# Patient Record
Sex: Female | Born: 1945
Health system: Southern US, Community
[De-identification: ages and names within clinical notes are randomized; demographics above are authoritative.]

## PROBLEM LIST (undated history)

## (undated) DIAGNOSIS — I10 Essential (primary) hypertension: Secondary | ICD-10-CM

## (undated) DIAGNOSIS — F329 Major depressive disorder, single episode, unspecified: Secondary | ICD-10-CM

## (undated) DIAGNOSIS — N183 Chronic kidney disease, stage 3 unspecified: Secondary | ICD-10-CM

## (undated) DIAGNOSIS — I509 Heart failure, unspecified: Secondary | ICD-10-CM

## (undated) DIAGNOSIS — E039 Hypothyroidism, unspecified: Secondary | ICD-10-CM

## (undated) DIAGNOSIS — K219 Gastro-esophageal reflux disease without esophagitis: Secondary | ICD-10-CM

## (undated) DIAGNOSIS — C801 Malignant (primary) neoplasm, unspecified: Secondary | ICD-10-CM

## (undated) DIAGNOSIS — E119 Type 2 diabetes mellitus without complications: Secondary | ICD-10-CM

## (undated) DIAGNOSIS — C55 Malignant neoplasm of uterus, part unspecified: Secondary | ICD-10-CM

## (undated) DIAGNOSIS — M1990 Unspecified osteoarthritis, unspecified site: Secondary | ICD-10-CM

## (undated) DIAGNOSIS — R519 Headache, unspecified: Secondary | ICD-10-CM

## (undated) DIAGNOSIS — M549 Dorsalgia, unspecified: Secondary | ICD-10-CM

## (undated) DIAGNOSIS — R51 Headache: Secondary | ICD-10-CM

## (undated) DIAGNOSIS — I4891 Unspecified atrial fibrillation: Secondary | ICD-10-CM

## (undated) DIAGNOSIS — I219 Acute myocardial infarction, unspecified: Secondary | ICD-10-CM

## (undated) DIAGNOSIS — F32A Depression, unspecified: Secondary | ICD-10-CM

## (undated) DIAGNOSIS — I2510 Atherosclerotic heart disease of native coronary artery without angina pectoris: Secondary | ICD-10-CM

## (undated) DIAGNOSIS — E538 Deficiency of other specified B group vitamins: Secondary | ICD-10-CM

## (undated) DIAGNOSIS — G5600 Carpal tunnel syndrome, unspecified upper limb: Secondary | ICD-10-CM

## (undated) DIAGNOSIS — Z8669 Personal history of other diseases of the nervous system and sense organs: Secondary | ICD-10-CM

## (undated) DIAGNOSIS — E669 Obesity, unspecified: Secondary | ICD-10-CM

## (undated) DIAGNOSIS — Z8719 Personal history of other diseases of the digestive system: Secondary | ICD-10-CM

## (undated) DIAGNOSIS — E785 Hyperlipidemia, unspecified: Secondary | ICD-10-CM

## (undated) DIAGNOSIS — G8929 Other chronic pain: Secondary | ICD-10-CM

## (undated) HISTORY — PX: LUMBAR FUSION: SHX111

## (undated) HISTORY — PX: CHOLECYSTECTOMY: SHX55

## (undated) HISTORY — PX: ABDOMINAL HYSTERECTOMY: SHX81

## (undated) HISTORY — DX: Personal history of other diseases of the nervous system and sense organs: Z86.69

## (undated) HISTORY — DX: Chronic kidney disease, stage 3 (moderate): N18.3

## (undated) HISTORY — PX: SPINE SURGERY: SHX786

## (undated) HISTORY — PX: CARDIAC SURGERY: SHX584

## (undated) HISTORY — DX: Gastro-esophageal reflux disease without esophagitis: K21.9

## (undated) HISTORY — PX: VESICOVAGINAL FISTULA CLOSURE W/ TAH: SUR271

## (undated) HISTORY — DX: Dorsalgia, unspecified: M54.9

## (undated) HISTORY — DX: Type 2 diabetes mellitus without complications: E11.9

## (undated) HISTORY — DX: Deficiency of other specified B group vitamins: E53.8

## (undated) HISTORY — DX: Other chronic pain: G89.29

## (undated) HISTORY — PX: HERNIA REPAIR: SHX51

## (undated) HISTORY — DX: Essential (primary) hypertension: I10

## (undated) HISTORY — DX: Atherosclerotic heart disease of native coronary artery without angina pectoris: I25.10

## (undated) HISTORY — DX: Obesity, unspecified: E66.9

## (undated) HISTORY — PX: CERVICAL LAMINECTOMY: SHX94

## (undated) HISTORY — PX: BACK SURGERY: SHX140

## (undated) HISTORY — DX: Malignant neoplasm of uterus, part unspecified: C55

## (undated) HISTORY — DX: Hyperlipidemia, unspecified: E78.5

## (undated) HISTORY — PX: OTHER SURGICAL HISTORY: SHX169

## (undated) HISTORY — DX: Chronic kidney disease, stage 3 unspecified: N18.30

---

## 1997-11-16 DIAGNOSIS — I219 Acute myocardial infarction, unspecified: Secondary | ICD-10-CM

## 1997-11-16 HISTORY — DX: Acute myocardial infarction, unspecified: I21.9

## 2001-08-09 ENCOUNTER — Ambulatory Visit (HOSPITAL_COMMUNITY): Admission: RE | Admit: 2001-08-09 | Discharge: 2001-08-09 | Payer: Self-pay | Admitting: Family Medicine

## 2001-08-09 ENCOUNTER — Encounter: Payer: Self-pay | Admitting: Family Medicine

## 2001-09-05 ENCOUNTER — Ambulatory Visit (HOSPITAL_COMMUNITY): Admission: RE | Admit: 2001-09-05 | Discharge: 2001-09-05 | Payer: Self-pay | Admitting: Pediatrics

## 2001-09-05 ENCOUNTER — Encounter: Payer: Self-pay | Admitting: Family Medicine

## 2001-11-14 ENCOUNTER — Ambulatory Visit (HOSPITAL_COMMUNITY): Admission: RE | Admit: 2001-11-14 | Discharge: 2001-11-14 | Payer: Self-pay | Admitting: Family Medicine

## 2001-11-14 ENCOUNTER — Encounter: Payer: Self-pay | Admitting: Family Medicine

## 2002-03-29 ENCOUNTER — Encounter: Payer: Self-pay | Admitting: Internal Medicine

## 2002-03-29 ENCOUNTER — Emergency Department (HOSPITAL_COMMUNITY): Admission: EM | Admit: 2002-03-29 | Discharge: 2002-03-29 | Payer: Self-pay | Admitting: Internal Medicine

## 2002-03-30 ENCOUNTER — Emergency Department (HOSPITAL_COMMUNITY): Admission: EM | Admit: 2002-03-30 | Discharge: 2002-03-30 | Payer: Self-pay | Admitting: Emergency Medicine

## 2002-03-31 ENCOUNTER — Encounter: Payer: Self-pay | Admitting: Internal Medicine

## 2002-03-31 ENCOUNTER — Ambulatory Visit (HOSPITAL_COMMUNITY): Admission: RE | Admit: 2002-03-31 | Discharge: 2002-03-31 | Payer: Self-pay | Admitting: Internal Medicine

## 2002-04-26 ENCOUNTER — Inpatient Hospital Stay (HOSPITAL_COMMUNITY): Admission: RE | Admit: 2002-04-26 | Discharge: 2002-04-27 | Payer: Self-pay | Admitting: Neurosurgery

## 2002-04-26 ENCOUNTER — Encounter: Payer: Self-pay | Admitting: Neurosurgery

## 2002-08-14 ENCOUNTER — Encounter: Payer: Self-pay | Admitting: Neurosurgery

## 2002-08-14 ENCOUNTER — Ambulatory Visit (HOSPITAL_COMMUNITY): Admission: RE | Admit: 2002-08-14 | Discharge: 2002-08-14 | Payer: Self-pay | Admitting: Neurosurgery

## 2002-11-17 ENCOUNTER — Encounter: Payer: Self-pay | Admitting: Family Medicine

## 2002-11-17 ENCOUNTER — Ambulatory Visit (HOSPITAL_COMMUNITY): Admission: RE | Admit: 2002-11-17 | Discharge: 2002-11-17 | Payer: Self-pay | Admitting: Family Medicine

## 2002-12-27 ENCOUNTER — Encounter: Payer: Self-pay | Admitting: Family Medicine

## 2002-12-27 ENCOUNTER — Ambulatory Visit (HOSPITAL_COMMUNITY): Admission: RE | Admit: 2002-12-27 | Discharge: 2002-12-27 | Payer: Self-pay | Admitting: Family Medicine

## 2003-01-19 ENCOUNTER — Encounter: Payer: Self-pay | Admitting: Family Medicine

## 2003-01-19 ENCOUNTER — Ambulatory Visit (HOSPITAL_COMMUNITY): Admission: RE | Admit: 2003-01-19 | Discharge: 2003-01-19 | Payer: Self-pay | Admitting: Family Medicine

## 2003-02-20 ENCOUNTER — Encounter (HOSPITAL_COMMUNITY): Admission: RE | Admit: 2003-02-20 | Discharge: 2003-03-22 | Payer: Self-pay | Admitting: Oncology

## 2003-02-20 ENCOUNTER — Encounter: Admission: RE | Admit: 2003-02-20 | Discharge: 2003-02-20 | Payer: Self-pay | Admitting: Oncology

## 2003-03-29 ENCOUNTER — Encounter (HOSPITAL_COMMUNITY): Admission: RE | Admit: 2003-03-29 | Discharge: 2003-04-28 | Payer: Self-pay | Admitting: Family Medicine

## 2003-03-29 ENCOUNTER — Encounter: Payer: Self-pay | Admitting: Family Medicine

## 2003-03-30 ENCOUNTER — Encounter: Payer: Self-pay | Admitting: Family Medicine

## 2003-05-18 ENCOUNTER — Encounter: Payer: Self-pay | Admitting: Family Medicine

## 2003-05-18 ENCOUNTER — Ambulatory Visit (HOSPITAL_COMMUNITY): Admission: RE | Admit: 2003-05-18 | Discharge: 2003-05-18 | Payer: Self-pay | Admitting: Family Medicine

## 2003-10-17 ENCOUNTER — Inpatient Hospital Stay (HOSPITAL_COMMUNITY): Admission: EM | Admit: 2003-10-17 | Discharge: 2003-10-18 | Payer: Self-pay | Admitting: Emergency Medicine

## 2003-11-26 ENCOUNTER — Ambulatory Visit (HOSPITAL_COMMUNITY): Admission: RE | Admit: 2003-11-26 | Discharge: 2003-11-26 | Payer: Self-pay | Admitting: Family Medicine

## 2004-06-29 ENCOUNTER — Emergency Department (HOSPITAL_COMMUNITY): Admission: EM | Admit: 2004-06-29 | Discharge: 2004-06-29 | Payer: Self-pay | Admitting: Emergency Medicine

## 2004-10-20 ENCOUNTER — Ambulatory Visit: Payer: Self-pay | Admitting: Family Medicine

## 2004-11-27 ENCOUNTER — Ambulatory Visit (HOSPITAL_COMMUNITY): Admission: RE | Admit: 2004-11-27 | Discharge: 2004-11-27 | Payer: Self-pay | Admitting: Family Medicine

## 2004-12-02 ENCOUNTER — Ambulatory Visit: Payer: Self-pay | Admitting: Family Medicine

## 2005-01-17 ENCOUNTER — Emergency Department (HOSPITAL_COMMUNITY): Admission: EM | Admit: 2005-01-17 | Discharge: 2005-01-17 | Payer: Self-pay | Admitting: Emergency Medicine

## 2005-01-17 ENCOUNTER — Inpatient Hospital Stay (HOSPITAL_COMMUNITY): Admission: AD | Admit: 2005-01-17 | Discharge: 2005-01-18 | Payer: Self-pay | Admitting: *Deleted

## 2005-01-27 ENCOUNTER — Ambulatory Visit: Payer: Self-pay | Admitting: Family Medicine

## 2005-03-03 ENCOUNTER — Ambulatory Visit: Payer: Self-pay | Admitting: Family Medicine

## 2005-04-09 ENCOUNTER — Ambulatory Visit: Payer: Self-pay | Admitting: Family Medicine

## 2005-05-20 ENCOUNTER — Ambulatory Visit: Payer: Self-pay | Admitting: Family Medicine

## 2005-07-30 ENCOUNTER — Ambulatory Visit: Payer: Self-pay | Admitting: Family Medicine

## 2005-08-27 ENCOUNTER — Ambulatory Visit: Payer: Self-pay | Admitting: Family Medicine

## 2005-10-15 ENCOUNTER — Ambulatory Visit: Payer: Self-pay | Admitting: Family Medicine

## 2005-11-16 HISTORY — PX: COLONOSCOPY: SHX174

## 2005-11-30 ENCOUNTER — Ambulatory Visit (HOSPITAL_COMMUNITY): Admission: RE | Admit: 2005-11-30 | Discharge: 2005-11-30 | Payer: Self-pay | Admitting: Family Medicine

## 2005-12-16 ENCOUNTER — Ambulatory Visit: Payer: Self-pay | Admitting: Family Medicine

## 2006-02-18 ENCOUNTER — Ambulatory Visit (HOSPITAL_COMMUNITY): Admission: RE | Admit: 2006-02-18 | Discharge: 2006-02-18 | Payer: Self-pay | Admitting: General Surgery

## 2006-02-23 ENCOUNTER — Ambulatory Visit (HOSPITAL_COMMUNITY): Admission: RE | Admit: 2006-02-23 | Discharge: 2006-02-23 | Payer: Self-pay | Admitting: General Surgery

## 2006-03-12 ENCOUNTER — Ambulatory Visit: Payer: Self-pay | Admitting: Family Medicine

## 2006-05-20 ENCOUNTER — Ambulatory Visit (HOSPITAL_COMMUNITY): Admission: RE | Admit: 2006-05-20 | Discharge: 2006-05-20 | Payer: Self-pay | Admitting: General Surgery

## 2006-05-28 ENCOUNTER — Ambulatory Visit: Payer: Self-pay | Admitting: Family Medicine

## 2006-09-10 ENCOUNTER — Ambulatory Visit: Payer: Self-pay | Admitting: Family Medicine

## 2006-09-13 ENCOUNTER — Ambulatory Visit (HOSPITAL_COMMUNITY): Admission: RE | Admit: 2006-09-13 | Discharge: 2006-09-13 | Payer: Self-pay | Admitting: Family Medicine

## 2006-10-28 ENCOUNTER — Ambulatory Visit: Payer: Self-pay | Admitting: Family Medicine

## 2006-12-06 ENCOUNTER — Encounter: Payer: Self-pay | Admitting: Family Medicine

## 2006-12-06 ENCOUNTER — Ambulatory Visit (HOSPITAL_COMMUNITY): Admission: RE | Admit: 2006-12-06 | Discharge: 2006-12-06 | Payer: Self-pay | Admitting: Family Medicine

## 2006-12-06 LAB — CONVERTED CEMR LAB
ALT: 8 units/L (ref 0–35)
AST: 15 units/L (ref 0–37)
Alkaline Phosphatase: 33 units/L — ABNORMAL LOW (ref 39–117)
BUN: 22 mg/dL (ref 6–23)
Bilirubin, Direct: 0.1 mg/dL (ref 0.0–0.3)
Calcium: 9.4 mg/dL (ref 8.4–10.5)
Cholesterol: 102 mg/dL (ref 0–200)
Creatinine, Ser: 1.15 mg/dL (ref 0.40–1.20)
Glucose, Bld: 100 mg/dL — ABNORMAL HIGH (ref 70–99)
Indirect Bilirubin: 0.4 mg/dL (ref 0.0–0.9)
VLDL: 44 mg/dL — ABNORMAL HIGH (ref 0–40)

## 2006-12-14 ENCOUNTER — Ambulatory Visit: Payer: Self-pay | Admitting: Family Medicine

## 2007-01-07 ENCOUNTER — Ambulatory Visit: Payer: Self-pay | Admitting: Family Medicine

## 2007-01-24 ENCOUNTER — Ambulatory Visit: Payer: Self-pay | Admitting: Family Medicine

## 2007-01-24 LAB — CONVERTED CEMR LAB
Basophils Absolute: 0 10*3/uL (ref 0.0–0.1)
Basophils Relative: 0 % (ref 0–1)
Eosinophils Relative: 1 % (ref 0–5)
Folate: 8.1 ng/mL
HCT: 36 % (ref 36.0–46.0)
Hemoglobin: 11.5 g/dL — ABNORMAL LOW (ref 12.0–15.0)
MCHC: 31.9 g/dL (ref 30.0–36.0)
Monocytes Absolute: 0.4 10*3/uL (ref 0.2–0.7)
Neutro Abs: 3.2 10*3/uL (ref 1.7–7.7)
RDW: 13.9 % (ref 11.5–14.0)
Retic Ct Pct: 2.8 % (ref 0.4–3.1)
Saturation Ratios: 20 % (ref 20–55)
TIBC: 432 ug/dL (ref 250–470)
UIBC: 346 ug/dL

## 2007-01-25 ENCOUNTER — Ambulatory Visit: Payer: Self-pay | Admitting: Family Medicine

## 2007-02-09 ENCOUNTER — Ambulatory Visit: Payer: Self-pay | Admitting: Family Medicine

## 2007-02-10 ENCOUNTER — Encounter: Payer: Self-pay | Admitting: Family Medicine

## 2007-02-16 ENCOUNTER — Ambulatory Visit (HOSPITAL_COMMUNITY): Payer: Self-pay | Admitting: Oncology

## 2007-02-16 ENCOUNTER — Encounter (HOSPITAL_COMMUNITY): Admission: RE | Admit: 2007-02-16 | Discharge: 2007-03-18 | Payer: Self-pay | Admitting: Oncology

## 2007-02-23 ENCOUNTER — Ambulatory Visit: Payer: Self-pay | Admitting: Family Medicine

## 2007-03-25 ENCOUNTER — Other Ambulatory Visit: Admission: RE | Admit: 2007-03-25 | Discharge: 2007-03-25 | Payer: Self-pay | Admitting: Family Medicine

## 2007-03-25 ENCOUNTER — Ambulatory Visit: Payer: Self-pay | Admitting: Family Medicine

## 2007-03-25 ENCOUNTER — Encounter: Payer: Self-pay | Admitting: Family Medicine

## 2007-03-25 LAB — CONVERTED CEMR LAB
AST: 15 units/L (ref 0–37)
Albumin: 4.2 g/dL (ref 3.5–5.2)
Alkaline Phosphatase: 33 units/L — ABNORMAL LOW (ref 39–117)
Calcium: 10.1 mg/dL (ref 8.4–10.5)
Creatinine, Ser: 1.26 mg/dL — ABNORMAL HIGH (ref 0.40–1.20)
HDL: 20 mg/dL — ABNORMAL LOW (ref >39)
Pap Smear: NORMAL
Total Protein: 6.9 g/dL (ref 6.0–8.3)
Triglycerides: 179 mg/dL — ABNORMAL HIGH (ref <150)

## 2007-03-29 ENCOUNTER — Ambulatory Visit (HOSPITAL_COMMUNITY): Admission: RE | Admit: 2007-03-29 | Discharge: 2007-03-29 | Payer: Self-pay | Admitting: Family Medicine

## 2007-04-02 ENCOUNTER — Emergency Department (HOSPITAL_COMMUNITY): Admission: EM | Admit: 2007-04-02 | Discharge: 2007-04-02 | Payer: Self-pay | Admitting: Emergency Medicine

## 2007-04-04 ENCOUNTER — Ambulatory Visit: Payer: Self-pay | Admitting: Family Medicine

## 2007-04-06 ENCOUNTER — Ambulatory Visit: Payer: Self-pay | Admitting: Family Medicine

## 2007-04-08 ENCOUNTER — Encounter (HOSPITAL_COMMUNITY): Admission: RE | Admit: 2007-04-08 | Discharge: 2007-05-08 | Payer: Self-pay | Admitting: Family Medicine

## 2007-04-08 ENCOUNTER — Ambulatory Visit: Admission: RE | Admit: 2007-04-08 | Discharge: 2007-04-08 | Payer: Self-pay | Admitting: Family Medicine

## 2007-04-12 ENCOUNTER — Ambulatory Visit: Payer: Self-pay | Admitting: Family Medicine

## 2007-04-14 ENCOUNTER — Ambulatory Visit: Payer: Self-pay | Admitting: Pulmonary Disease

## 2007-04-19 ENCOUNTER — Ambulatory Visit: Payer: Self-pay | Admitting: Family Medicine

## 2007-04-27 ENCOUNTER — Ambulatory Visit: Payer: Self-pay | Admitting: Family Medicine

## 2007-05-11 ENCOUNTER — Ambulatory Visit: Payer: Self-pay | Admitting: Family Medicine

## 2007-05-26 ENCOUNTER — Ambulatory Visit: Payer: Self-pay | Admitting: Family Medicine

## 2007-06-01 ENCOUNTER — Ambulatory Visit: Payer: Self-pay | Admitting: Family Medicine

## 2007-06-08 ENCOUNTER — Ambulatory Visit: Payer: Self-pay | Admitting: Family Medicine

## 2007-06-14 ENCOUNTER — Ambulatory Visit: Payer: Self-pay | Admitting: Family Medicine

## 2007-07-05 ENCOUNTER — Encounter: Payer: Self-pay | Admitting: Family Medicine

## 2007-07-05 LAB — CONVERTED CEMR LAB
ALT: 9 units/L (ref 0–35)
BUN: 19 mg/dL (ref 6–23)
Bilirubin, Direct: 0.1 mg/dL (ref 0.0–0.3)
Calcium: 9.2 mg/dL (ref 8.4–10.5)
Chloride: 100 meq/L (ref 96–112)
Creatinine, Ser: 0.94 mg/dL (ref 0.40–1.20)
Total Protein: 6.6 g/dL (ref 6.0–8.3)

## 2007-07-07 ENCOUNTER — Ambulatory Visit: Payer: Self-pay | Admitting: Family Medicine

## 2007-07-07 LAB — CONVERTED CEMR LAB: Vitamin B-12: 460 pg/mL (ref 211–911)

## 2007-07-15 ENCOUNTER — Ambulatory Visit: Payer: Self-pay | Admitting: Family Medicine

## 2007-08-18 ENCOUNTER — Ambulatory Visit: Payer: Self-pay | Admitting: Family Medicine

## 2007-09-28 ENCOUNTER — Ambulatory Visit: Payer: Self-pay | Admitting: Family Medicine

## 2007-10-28 ENCOUNTER — Ambulatory Visit: Payer: Self-pay | Admitting: Family Medicine

## 2007-11-04 ENCOUNTER — Encounter: Payer: Self-pay | Admitting: Family Medicine

## 2007-11-04 LAB — CONVERTED CEMR LAB
ALT: 8 units/L (ref 0–35)
AST: 14 units/L (ref 0–37)
Albumin: 4 g/dL (ref 3.5–5.2)
CO2: 27 meq/L (ref 19–32)
Calcium: 9.3 mg/dL (ref 8.4–10.5)
Cholesterol: 120 mg/dL (ref 0–200)
HDL: 41 mg/dL (ref >39)
Potassium: 4.3 meq/L (ref 3.5–5.3)
Sodium: 139 meq/L (ref 135–145)
Total CHOL/HDL Ratio: 2.9

## 2007-11-17 ENCOUNTER — Encounter: Payer: Self-pay | Admitting: Family Medicine

## 2007-11-17 HISTORY — PX: LAPAROSCOPIC GASTRIC BANDING: SHX1100

## 2007-12-02 ENCOUNTER — Ambulatory Visit: Payer: Self-pay | Admitting: Family Medicine

## 2007-12-08 ENCOUNTER — Ambulatory Visit (HOSPITAL_COMMUNITY): Admission: RE | Admit: 2007-12-08 | Discharge: 2007-12-08 | Payer: Self-pay | Admitting: Family Medicine

## 2007-12-09 ENCOUNTER — Encounter: Payer: Self-pay | Admitting: Family Medicine

## 2007-12-09 DIAGNOSIS — E785 Hyperlipidemia, unspecified: Secondary | ICD-10-CM

## 2007-12-09 DIAGNOSIS — I10 Essential (primary) hypertension: Secondary | ICD-10-CM | POA: Insufficient documentation

## 2007-12-09 DIAGNOSIS — K219 Gastro-esophageal reflux disease without esophagitis: Secondary | ICD-10-CM

## 2007-12-09 DIAGNOSIS — E538 Deficiency of other specified B group vitamins: Secondary | ICD-10-CM | POA: Insufficient documentation

## 2007-12-09 DIAGNOSIS — R7302 Impaired glucose tolerance (oral): Secondary | ICD-10-CM | POA: Insufficient documentation

## 2007-12-09 DIAGNOSIS — M48061 Spinal stenosis, lumbar region without neurogenic claudication: Secondary | ICD-10-CM

## 2007-12-09 DIAGNOSIS — E669 Obesity, unspecified: Secondary | ICD-10-CM | POA: Insufficient documentation

## 2007-12-09 DIAGNOSIS — M5416 Radiculopathy, lumbar region: Secondary | ICD-10-CM

## 2007-12-21 ENCOUNTER — Emergency Department (HOSPITAL_COMMUNITY): Admission: EM | Admit: 2007-12-21 | Discharge: 2007-12-21 | Payer: Self-pay | Admitting: Emergency Medicine

## 2008-01-12 ENCOUNTER — Ambulatory Visit: Payer: Self-pay | Admitting: Family Medicine

## 2008-01-12 LAB — CONVERTED CEMR LAB
Albumin: 4 g/dL (ref 3.5–5.2)
Alkaline Phosphatase: 62 units/L (ref 39–117)
Basophils Absolute: 0 10*3/uL (ref 0.0–0.1)
Basophils Relative: 0 % (ref 0–1)
Chloride: 101 meq/L (ref 96–112)
Creatinine, Ser: 0.99 mg/dL (ref 0.40–1.20)
HDL: 42 mg/dL (ref >39)
LDL Cholesterol: 49 mg/dL (ref 0–99)
MCHC: 30.8 g/dL (ref 30.0–36.0)
Monocytes Absolute: 0.5 10*3/uL (ref 0.1–1.0)
Neutro Abs: 3.3 10*3/uL (ref 1.7–7.7)
Neutrophils Relative %: 48 % (ref 43–77)
Platelets: 273 10*3/uL (ref 150–400)
Potassium: 4.8 meq/L (ref 3.5–5.3)
RDW: 14.9 % (ref 11.5–15.5)
Total Protein: 7.1 g/dL (ref 6.0–8.3)
Triglycerides: 184 mg/dL — ABNORMAL HIGH (ref <150)

## 2008-02-09 ENCOUNTER — Ambulatory Visit: Payer: Self-pay | Admitting: Family Medicine

## 2008-03-08 ENCOUNTER — Encounter: Payer: Self-pay | Admitting: Family Medicine

## 2008-03-08 ENCOUNTER — Encounter (INDEPENDENT_AMBULATORY_CARE_PROVIDER_SITE_OTHER): Payer: Self-pay | Admitting: *Deleted

## 2008-03-08 ENCOUNTER — Ambulatory Visit: Payer: Self-pay | Admitting: Family Medicine

## 2008-03-16 ENCOUNTER — Emergency Department (HOSPITAL_COMMUNITY): Admission: EM | Admit: 2008-03-16 | Discharge: 2008-03-16 | Payer: Self-pay | Admitting: Emergency Medicine

## 2008-03-20 ENCOUNTER — Ambulatory Visit: Payer: Self-pay | Admitting: Family Medicine

## 2008-04-18 ENCOUNTER — Ambulatory Visit: Payer: Self-pay | Admitting: Family Medicine

## 2008-04-25 ENCOUNTER — Ambulatory Visit: Payer: Self-pay | Admitting: Family Medicine

## 2008-06-19 ENCOUNTER — Encounter: Payer: Self-pay | Admitting: Family Medicine

## 2008-06-25 ENCOUNTER — Encounter: Payer: Self-pay | Admitting: Family Medicine

## 2008-06-27 ENCOUNTER — Telehealth: Payer: Self-pay | Admitting: Family Medicine

## 2008-06-27 ENCOUNTER — Encounter: Payer: Self-pay | Admitting: Family Medicine

## 2008-07-06 ENCOUNTER — Encounter: Payer: Self-pay | Admitting: Family Medicine

## 2008-07-11 ENCOUNTER — Encounter: Payer: Self-pay | Admitting: Family Medicine

## 2008-07-27 ENCOUNTER — Ambulatory Visit: Payer: Self-pay | Admitting: Family Medicine

## 2008-07-27 LAB — CONVERTED CEMR LAB
Blood Glucose, Fasting: 137 mg/dL
Hgb A1c MFr Bld: 6.4 %

## 2008-07-31 ENCOUNTER — Telehealth: Payer: Self-pay | Admitting: Family Medicine

## 2008-08-08 ENCOUNTER — Encounter: Payer: Self-pay | Admitting: Family Medicine

## 2008-09-05 ENCOUNTER — Ambulatory Visit: Payer: Self-pay | Admitting: Family Medicine

## 2008-09-05 ENCOUNTER — Telehealth: Payer: Self-pay | Admitting: Family Medicine

## 2008-09-05 LAB — CONVERTED CEMR LAB
AST: 15 units/L (ref 0–37)
Albumin: 4.3 g/dL (ref 3.5–5.2)
BUN: 21 mg/dL (ref 6–23)
Basophils Relative: 0 % (ref 0–1)
Calcium: 9.4 mg/dL (ref 8.4–10.5)
Eosinophils Absolute: 0.2 10*3/uL (ref 0.0–0.7)
Eosinophils Relative: 3 % (ref 0–5)
HCT: 38.1 % (ref 36.0–46.0)
HDL: 47 mg/dL (ref >39)
Hemoglobin: 12.1 g/dL (ref 12.0–15.0)
Ketones, urine, test strip: NEGATIVE
LDL Cholesterol: 77 mg/dL (ref 0–99)
MCHC: 31.8 g/dL (ref 30.0–36.0)
MCV: 94.1 fL (ref 78.0–100.0)
Monocytes Absolute: 0.4 10*3/uL (ref 0.1–1.0)
Monocytes Relative: 7 % (ref 3–12)
Nitrite: NEGATIVE
RBC: 4.05 M/uL (ref 3.87–5.11)
Specific Gravity, Urine: 1.01
TSH: 2.827 microintl units/mL (ref 0.350–4.50)
Total Bilirubin: 0.3 mg/dL (ref 0.3–1.2)
Total CHOL/HDL Ratio: 3.7
VLDL: 49 mg/dL — ABNORMAL HIGH (ref 0–40)
pH: 5

## 2008-09-06 ENCOUNTER — Encounter: Payer: Self-pay | Admitting: Family Medicine

## 2008-09-07 ENCOUNTER — Encounter: Payer: Self-pay | Admitting: Family Medicine

## 2008-09-07 ENCOUNTER — Ambulatory Visit (HOSPITAL_COMMUNITY): Admission: RE | Admit: 2008-09-07 | Discharge: 2008-09-07 | Payer: Self-pay | Admitting: Family Medicine

## 2008-09-11 ENCOUNTER — Telehealth: Payer: Self-pay | Admitting: Family Medicine

## 2008-09-19 ENCOUNTER — Encounter: Payer: Self-pay | Admitting: Family Medicine

## 2008-09-24 ENCOUNTER — Telehealth: Payer: Self-pay | Admitting: Family Medicine

## 2008-09-25 ENCOUNTER — Encounter: Payer: Self-pay | Admitting: Family Medicine

## 2008-10-25 ENCOUNTER — Encounter: Payer: Self-pay | Admitting: Family Medicine

## 2008-11-01 ENCOUNTER — Telehealth: Payer: Self-pay | Admitting: Family Medicine

## 2008-12-10 ENCOUNTER — Encounter: Payer: Self-pay | Admitting: Family Medicine

## 2008-12-12 ENCOUNTER — Encounter: Payer: Self-pay | Admitting: Family Medicine

## 2008-12-13 ENCOUNTER — Encounter: Payer: Self-pay | Admitting: Family Medicine

## 2008-12-17 ENCOUNTER — Encounter: Payer: Self-pay | Admitting: Family Medicine

## 2008-12-24 ENCOUNTER — Ambulatory Visit (HOSPITAL_COMMUNITY): Admission: RE | Admit: 2008-12-24 | Discharge: 2008-12-24 | Payer: Self-pay | Admitting: Family Medicine

## 2008-12-26 ENCOUNTER — Encounter: Payer: Self-pay | Admitting: Family Medicine

## 2009-01-09 ENCOUNTER — Encounter: Payer: Self-pay | Admitting: Family Medicine

## 2009-01-22 ENCOUNTER — Encounter: Payer: Self-pay | Admitting: Family Medicine

## 2009-01-30 ENCOUNTER — Encounter: Payer: Self-pay | Admitting: Family Medicine

## 2009-02-08 ENCOUNTER — Telehealth: Payer: Self-pay | Admitting: Family Medicine

## 2009-02-08 DIAGNOSIS — R5381 Other malaise: Secondary | ICD-10-CM | POA: Insufficient documentation

## 2009-02-08 DIAGNOSIS — R5383 Other fatigue: Secondary | ICD-10-CM

## 2009-02-14 ENCOUNTER — Ambulatory Visit: Payer: Self-pay | Admitting: Family Medicine

## 2009-02-14 LAB — CONVERTED CEMR LAB: Hgb A1c MFr Bld: 6 %

## 2009-02-19 ENCOUNTER — Encounter: Payer: Self-pay | Admitting: Family Medicine

## 2009-02-21 ENCOUNTER — Ambulatory Visit: Payer: Self-pay | Admitting: Family Medicine

## 2009-02-21 LAB — CONVERTED CEMR LAB
ALT: <8 units/L (ref 0–35)
AST: 14 units/L (ref 0–37)
BUN: 15 mg/dL (ref 6–23)
Bilirubin, Direct: 0.1 mg/dL (ref 0.0–0.3)
Calcium: 10.3 mg/dL (ref 8.4–10.5)
Cholesterol: 164 mg/dL (ref 0–200)
Glucose, Bld: 104 mg/dL — ABNORMAL HIGH (ref 70–99)
Indirect Bilirubin: 0.3 mg/dL (ref 0.0–0.9)
Sodium: 138 meq/L (ref 135–145)
Total CHOL/HDL Ratio: 3.6
Triglycerides: 254 mg/dL — ABNORMAL HIGH (ref <150)

## 2009-02-25 ENCOUNTER — Telehealth: Payer: Self-pay | Admitting: Family Medicine

## 2009-03-04 ENCOUNTER — Telehealth: Payer: Self-pay | Admitting: Family Medicine

## 2009-03-04 ENCOUNTER — Encounter: Payer: Self-pay | Admitting: Family Medicine

## 2009-03-05 ENCOUNTER — Telehealth: Payer: Self-pay | Admitting: Family Medicine

## 2009-03-07 ENCOUNTER — Telehealth: Payer: Self-pay | Admitting: Family Medicine

## 2009-03-13 ENCOUNTER — Encounter: Payer: Self-pay | Admitting: Family Medicine

## 2009-03-18 ENCOUNTER — Encounter: Payer: Self-pay | Admitting: Family Medicine

## 2009-03-22 ENCOUNTER — Ambulatory Visit: Payer: Self-pay | Admitting: Family Medicine

## 2009-04-24 ENCOUNTER — Ambulatory Visit: Payer: Self-pay | Admitting: Family Medicine

## 2009-04-24 ENCOUNTER — Telehealth: Payer: Self-pay | Admitting: Family Medicine

## 2009-05-14 ENCOUNTER — Encounter: Payer: Self-pay | Admitting: Family Medicine

## 2009-05-31 ENCOUNTER — Ambulatory Visit: Payer: Self-pay | Admitting: Family Medicine

## 2009-06-04 ENCOUNTER — Encounter: Payer: Self-pay | Admitting: Family Medicine

## 2009-06-04 LAB — CONVERTED CEMR LAB
Creatinine, Urine: 141.2 mg/dL
Microalb, Ur: 0.87 mg/dL (ref 0.00–1.89)

## 2009-06-12 ENCOUNTER — Encounter: Payer: Self-pay | Admitting: Family Medicine

## 2009-06-14 ENCOUNTER — Encounter: Payer: Self-pay | Admitting: Family Medicine

## 2009-06-21 LAB — CONVERTED CEMR LAB
Cholesterol: 179 mg/dL (ref 0–200)
Triglycerides: 177 mg/dL — ABNORMAL HIGH (ref <150)

## 2009-08-06 ENCOUNTER — Encounter: Payer: Self-pay | Admitting: Family Medicine

## 2009-09-05 ENCOUNTER — Emergency Department (HOSPITAL_COMMUNITY): Admission: EM | Admit: 2009-09-05 | Discharge: 2009-09-05 | Payer: Self-pay | Admitting: Emergency Medicine

## 2009-09-20 ENCOUNTER — Encounter: Payer: Self-pay | Admitting: Family Medicine

## 2009-09-20 ENCOUNTER — Ambulatory Visit: Payer: Self-pay | Admitting: Family Medicine

## 2009-09-20 ENCOUNTER — Other Ambulatory Visit: Admission: RE | Admit: 2009-09-20 | Discharge: 2009-09-20 | Payer: Self-pay | Admitting: Family Medicine

## 2009-09-20 LAB — CONVERTED CEMR LAB
Glucose, Bld: 138 mg/dL
Hgb A1c MFr Bld: 5.8 %

## 2009-11-01 ENCOUNTER — Encounter: Payer: Self-pay | Admitting: Family Medicine

## 2009-11-27 ENCOUNTER — Encounter: Payer: Self-pay | Admitting: Family Medicine

## 2010-01-02 ENCOUNTER — Ambulatory Visit (HOSPITAL_COMMUNITY): Admission: RE | Admit: 2010-01-02 | Discharge: 2010-01-02 | Payer: Self-pay | Admitting: Family Medicine

## 2010-01-06 ENCOUNTER — Telehealth: Payer: Self-pay | Admitting: Family Medicine

## 2010-01-06 LAB — CONVERTED CEMR LAB
Albumin: 4 g/dL (ref 3.5–5.2)
Alkaline Phosphatase: 73 units/L (ref 39–117)
Chloride: 102 meq/L (ref 96–112)
HDL: 54 mg/dL (ref >39)
LDL Cholesterol: 98 mg/dL (ref 0–99)
Potassium: 5.4 meq/L — ABNORMAL HIGH (ref 3.5–5.3)
Sodium: 138 meq/L (ref 135–145)
Total Protein: 6.5 g/dL (ref 6.0–8.3)
Triglycerides: 212 mg/dL — ABNORMAL HIGH (ref <150)
VLDL: 42 mg/dL — ABNORMAL HIGH (ref 0–40)
Vitamin B-12: 964 pg/mL — ABNORMAL HIGH (ref 211–911)

## 2010-01-24 ENCOUNTER — Ambulatory Visit: Payer: Self-pay | Admitting: Family Medicine

## 2010-01-28 ENCOUNTER — Encounter: Payer: Self-pay | Admitting: Family Medicine

## 2010-01-29 ENCOUNTER — Encounter: Payer: Self-pay | Admitting: Family Medicine

## 2010-01-29 LAB — CONVERTED CEMR LAB: Retic Ct Pct: 1.2 % (ref 0.4–3.1)

## 2010-02-17 ENCOUNTER — Telehealth: Payer: Self-pay | Admitting: Family Medicine

## 2010-02-19 ENCOUNTER — Telehealth: Payer: Self-pay | Admitting: Family Medicine

## 2010-02-21 ENCOUNTER — Encounter: Payer: Self-pay | Admitting: Family Medicine

## 2010-03-04 ENCOUNTER — Encounter: Payer: Self-pay | Admitting: Family Medicine

## 2010-03-05 ENCOUNTER — Telehealth: Payer: Self-pay | Admitting: Family Medicine

## 2010-03-11 ENCOUNTER — Telehealth: Payer: Self-pay | Admitting: Family Medicine

## 2010-03-14 ENCOUNTER — Ambulatory Visit: Payer: Self-pay | Admitting: Family Medicine

## 2010-04-01 ENCOUNTER — Telehealth: Payer: Self-pay | Admitting: Family Medicine

## 2010-05-13 LAB — CONVERTED CEMR LAB
AST: 15 units/L (ref 0–37)
Bilirubin, Direct: 0.1 mg/dL (ref 0.0–0.3)
Total Bilirubin: 0.4 mg/dL (ref 0.3–1.2)
Total CHOL/HDL Ratio: 4
Vit D, 25-Hydroxy: 41 ng/mL (ref 30–89)
Vitamin B-12: 302 pg/mL (ref 211–911)

## 2010-06-13 ENCOUNTER — Ambulatory Visit: Payer: Self-pay | Admitting: Family Medicine

## 2010-06-13 LAB — CONVERTED CEMR LAB: TSH: 2.547 microintl units/mL (ref 0.350–4.500)

## 2010-06-27 ENCOUNTER — Ambulatory Visit (HOSPITAL_COMMUNITY): Admission: RE | Admit: 2010-06-27 | Discharge: 2010-06-27 | Payer: Self-pay | Admitting: Family Medicine

## 2010-07-23 ENCOUNTER — Telehealth: Payer: Self-pay | Admitting: Family Medicine

## 2010-07-30 ENCOUNTER — Emergency Department (HOSPITAL_COMMUNITY): Admission: EM | Admit: 2010-07-30 | Discharge: 2010-07-30 | Payer: Self-pay | Admitting: Emergency Medicine

## 2010-08-18 ENCOUNTER — Telehealth: Payer: Self-pay | Admitting: Family Medicine

## 2010-08-21 ENCOUNTER — Ambulatory Visit: Payer: Self-pay | Admitting: Family Medicine

## 2010-08-21 LAB — CONVERTED CEMR LAB
ALT: <8 units/L (ref 0–35)
AST: 15 units/L (ref 0–37)
Albumin: 4 g/dL (ref 3.5–5.2)
CO2: 26 meq/L (ref 19–32)
Calcium: 9.5 mg/dL (ref 8.4–10.5)
Cholesterol: 142 mg/dL (ref 0–200)
HDL: 41 mg/dL (ref >39)
Sodium: 137 meq/L (ref 135–145)
Total CHOL/HDL Ratio: 3.5
Total Protein: 6.4 g/dL (ref 6.0–8.3)
Triglycerides: 165 mg/dL — ABNORMAL HIGH (ref <150)
Uric Acid, Serum: 5.4 mg/dL (ref 2.4–7.0)

## 2010-09-08 ENCOUNTER — Encounter: Payer: Self-pay | Admitting: Family Medicine

## 2010-09-15 ENCOUNTER — Telehealth: Payer: Self-pay | Admitting: Family Medicine

## 2010-09-26 ENCOUNTER — Ambulatory Visit: Payer: Self-pay | Admitting: Family Medicine

## 2010-09-27 ENCOUNTER — Encounter: Payer: Self-pay | Admitting: Family Medicine

## 2010-09-27 LAB — CONVERTED CEMR LAB: Microalb, Ur: 0.5 mg/dL (ref 0.00–1.89)

## 2010-09-28 DIAGNOSIS — E039 Hypothyroidism, unspecified: Secondary | ICD-10-CM | POA: Insufficient documentation

## 2010-09-28 DIAGNOSIS — F411 Generalized anxiety disorder: Secondary | ICD-10-CM

## 2010-09-28 DIAGNOSIS — B379 Candidiasis, unspecified: Secondary | ICD-10-CM | POA: Insufficient documentation

## 2010-09-28 DIAGNOSIS — F418 Other specified anxiety disorders: Secondary | ICD-10-CM | POA: Insufficient documentation

## 2010-10-07 ENCOUNTER — Telehealth: Payer: Self-pay | Admitting: Family Medicine

## 2010-11-03 ENCOUNTER — Telehealth: Payer: Self-pay | Admitting: Family Medicine

## 2010-11-22 ENCOUNTER — Emergency Department (HOSPITAL_COMMUNITY)
Admission: EM | Admit: 2010-11-22 | Discharge: 2010-11-22 | Payer: Self-pay | Source: Home / Self Care | Admitting: Emergency Medicine

## 2010-12-09 ENCOUNTER — Encounter: Payer: Self-pay | Admitting: Family Medicine

## 2010-12-18 LAB — CONVERTED CEMR LAB
BUN: 21 mg/dL (ref 6–23)
Basophils Relative: 0 % (ref 0–1)
Cholesterol: 173 mg/dL (ref 0–200)
Eosinophils Absolute: 0.2 10*3/uL (ref 0.0–0.7)
MCHC: 33.2 g/dL (ref 30.0–36.0)
MCV: 93.3 fL (ref 78.0–100.0)
Neutrophils Relative %: 37 % — ABNORMAL LOW (ref 43–77)
Platelets: 282 10*3/uL (ref 150–400)
Potassium: 4.4 meq/L (ref 3.5–5.3)
RDW: 13.3 % (ref 11.5–15.5)
Sodium: 139 meq/L (ref 135–145)
Total CHOL/HDL Ratio: 4.2
Triglycerides: 167 mg/dL — ABNORMAL HIGH (ref <150)
VLDL: 33 mg/dL (ref 0–40)

## 2010-12-18 NOTE — Letter (Signed)
Summary: Letter to inform about bone density appt.  Letter to inform about bone density appt.   Imported By: Eliezer Mccoy 06/17/2010 11:52:08  _____________________________________________________________________  External Attachment:    Type:   Image     Comment:   External Document

## 2010-12-18 NOTE — Assessment & Plan Note (Signed)
Summary: office visit   Vital Signs:  Patient profile:   65 year old female Menstrual status:  hysterectomy Height:      63 inches Weight:      208.50 pounds BMI:     37.07 O2 Sat:      96 % Pulse rate:   87 / minute Pulse rhythm:   regular Resp:     16 per minute BP sitting:   130 / 70  (left arm) Cuff size:   large  Vitals Entered By: Kate Sable LPN (April 29, 624THL [id number] AM)  Nutrition Counseling: Patient's BMI is greater than 25 and therefore counseled on weight management options. CC: Follow up chronic problems   CC:  Follow up chronic problems.  History of Present Illness: Reports  that she has been doing well. Denies recent fever or chills. Denies sinus pressure, nasal congestion , ear pain or sore throat. Denies chest congestion, or cough productive of sputum. Denies chest pain, palpitations, PND, orthopnea or leg swelling. Denies abdominal pain, nausea, vomitting, diarrhea or constipation. Denies change in bowel movements or bloody stool. Denies dysuria , frequency, incontinence or hesitancy. Reports increased back pain with reduced mobility and spasm for the past week, requesting an injection in the office today. She is concerned about the fact that her weight is essentially at a standstill, but she does admoit to eating an excessive amt of sweets  Denies headaches, vertigo, seizures. Denies depression, anxiety or insomnia. Denies  rash, lesions, or itch.      Current Medications (verified): 1)  Omeprazole 40 Mg Cpdr (Omeprazole) .... Take 1 Tablet By Mouth Once A Day 2)  Levoxyl 50 Mcg  Tabs (Levothyroxine Sodium) .... Take One Tablet By Mouth Once A Day 3)  Bayer Aspirin Ec Low Dose 81 Mg  Tbec (Aspirin) .... Take One Tablet By Mouth Once A Day 4)  Calcium 500/d 500-200 Mg-Unit  Tabs (Calcium Carbonate-Vitamin D) .... Take One Tblaet By Mouth Three Times A Day 5)  Amitriptyline Hcl 25 Mg  Tabs (Amitriptyline Hcl) .... Take One Tablet By Mouth At Bedtime 6)   Clonazepam 1 Mg  Tabs (Clonazepam) .... Take One Tablet By Mouth Three Times A Day 7)  Loradamed 10 Mg  Tabs (Loratadine) .... Take 1 Tablet By Mouth Once A Day 8)  Simvastatin 20 Mg  Tabs (Simvastatin) .... Take 1 Tablet By Mouth Once A Day 9)  Soma 350 Mg  Tabs (Carisoprodol) .... Take 1 Tab By Mouth At Bedtime 10)  Hydrocodone-Acetaminophen 10-500 Mg  Tabs (Hydrocodone-Acetaminophen) .... Take 1 Tablet By Mouth Four Times A Day 11)  Zolpidem Tartrate 10 Mg Tabs (Zolpidem Tartrate) .... One Tab By Mouth Qhs 12)  Lotensin 20 Mg Tabs (Benazepril Hcl) .... One Tab By Mouth Qd 13)  Metformin Hcl 500 Mg Tabs (Metformin Hcl) .... Take 1 Tablet By Mouth Two Times A Day 14)  Multivitamins  Tabs (Multiple Vitamin) .... Take 1 Tablet By Mouth Once A Day 15)  Amlodipine Besylate 5 Mg Tabs (Amlodipine Besylate) .... Take 1 Tablet By Mouth Once A Day 16)  Vitamin D (Ergocalciferol) 50000 Unit Caps (Ergocalciferol) .... One Cap By Mouth Every Week 17)  Cymbalta 60 Mg Cpep (Duloxetine Hcl) .... One Cap By Mouth Two Times A Day  Allergies: 1)  ! Demerol 2)  ! Darvocet 3)  ! * Niaspan  Past History:  Past Surgical History: Cervical laminectomy Cholecystectomy Inguinal herniorrhaphy Hysterectomy Lumbar fusion left index finger repair Lap band   2009  Family History: Mother- deceased- heart attack Father- deceased- heart attack 1 sister-hypertensive, died at age 66 of an MI in 12-19-09 brother- deceased- cancer, renal cell  Social History: Divorced quit smoking in April 2011 Alcohol use-no Drug use-no Two living children, only son died at aghe 24  in an MVa  Review of Systems      See HPI General:  Complains of fatigue. Eyes:  Denies blurring, discharge, eye pain, and red eye. MS:  Complains of low back pain and mid back pain; low back pain radiating to left hip and leg x1 week. Endo:  Denies cold intolerance, excessive hunger, excessive thirst, excessive urination, heat intolerance,  polyuria, and weight change. Heme:  Denies abnormal bruising and bleeding. Allergy:  Complains of seasonal allergies.  Physical Exam  General:  Well-developed,obese,in no acute distress; alert,appropriate and cooperative throughout examination HEENT: No facial asymmetry,  EOMI, No sinus tenderness, TM's Clear, oropharynx  pink and moist.   Chest: Clear to auscultation bilaterally.  CVS: S1, S2, No murmurs, No S3.   Abd: Soft, Nontender.  MS: decreased  ROM spine,adequate in  hips, reduced in right shoulder and adequate in knees.  Ext: No edema.   CNS: CN 2-12 intact, power tone and sensation normal throughout.   Skin: Intact, no visible lesions or rashes.  Psych: Good eye contact, normal affect.  Memory intact, not anxious or depressed appearing.   Diabetes Management Exam:    Foot Exam (with socks and/or shoes not present):       Sensory-Monofilament:          Left foot: diminished          Right foot: diminished       Inspection:          Left foot: normal          Right foot: normal       Nails:          Left foot: thickened          Right foot: thickened   Impression & Recommendations:  Problem # 1:  BACK PAIN, CHRONIC (ICD-724.5) Assessment Deteriorated  Her updated medication list for this problem includes:    Bayer Aspirin Ec Low Dose 81 Mg Tbec (Aspirin) .Marland Kitchen... Take one tablet by mouth once a day    Soma 350 Mg Tabs (Carisoprodol) .Marland Kitchen... Take 1 tab by mouth at bedtime    Hydrocodone-acetaminophen 10-500 Mg Tabs (Hydrocodone-acetaminophen) .Marland Kitchen... Take 1 tablet by mouth four times a day  Orders: Ketorolac-Toradol 15mg  [id number]) Admin of Therapeutic Inj  intramuscular or subcutaneous [id number])  Problem # 2:  OBESITY, UNSPECIFIED (ICD-278.00) Assessment: Unchanged  Ht: 63 (03/14/2010)   Wt: 208.50 (03/14/2010)   BMI: 37.07 (03/14/2010), encourAGED PT TO INC PHYSICAL ACTIVITY AND REDUCE THE INTAKE OF SWEETS  Problem # 3:  DIABETES MELLITUS, TYPE II, WITHOUT  COMPLICATIONS ([id number]) Assessment: Deteriorated  Her updated medication list for this problem includes:    Bayer Aspirin Ec Low Dose 81 Mg Tbec (Aspirin) .Marland Kitchen... Take one tablet by mouth once a day    Lotensin 20 Mg Tabs (Benazepril hcl) ..... One tab by mouth qd    Metformin Hcl 500 Mg Tabs (Metformin hcl) .Marland Kitchen... Take 1 tablet by mouth two times a day  Orders: T- Hemoglobin A1C [id number])  Labs Reviewed: Creat: 1.00 (01/02/2010)    Reviewed HgBA1c results: 6.5 (01/24/2010)  5.8 (09/20/2009)  Problem # 4:  ESSENTIAL HYPERTENSION, BENIGN (ICD-401.1) Assessment: Improved  Her updated medication list for this  problem includes:    Lotensin 20 Mg Tabs (Benazepril hcl) ..... One tab by mouth qd    Amlodipine Besylate 5 Mg Tabs (Amlodipine besylate) .Marland Kitchen... Take 1 tablet by mouth once a day  BP today: 130/70 Prior BP: 180/90 (01/24/2010)  Labs Reviewed: K+: 5.4 (01/02/2010) Creat: : 1.00 (01/02/2010)   Chol: 194 (01/02/2010)   HDL: 54 (01/02/2010)   LDL: 98 (01/02/2010)   TG: 212 (01/02/2010)  Complete Medication List: 1)  Levoxyl 50 Mcg Tabs (Levothyroxine sodium) .... Take one tablet by mouth once a day 2)  Bayer Aspirin Ec Low Dose 81 Mg Tbec (Aspirin) .... Take one tablet by mouth once a day 3)  Calcium 500/d 500-200 Mg-unit Tabs (Calcium carbonate-vitamin d) .... Take one tblaet by mouth three times a day 4)  Amitriptyline Hcl 25 Mg Tabs (Amitriptyline hcl) .... Take one tablet by mouth at bedtime 5)  Clonazepam 1 Mg Tabs (Clonazepam) .... Take one tablet by mouth three times a day 6)  Loradamed 10 Mg Tabs (Loratadine) .... Take 1 tablet by mouth once a day 7)  Simvastatin 20 Mg Tabs (Simvastatin) .... Take 1 tablet by mouth once a day 8)  Soma 350 Mg Tabs (Carisoprodol) .... Take 1 tab by mouth at bedtime 9)  Hydrocodone-acetaminophen 10-500 Mg Tabs (Hydrocodone-acetaminophen) .... Take 1 tablet by mouth four times a day 10)  Zolpidem Tartrate 10 Mg Tabs (Zolpidem tartrate)  .... One tab by mouth qhs 11)  Lotensin 20 Mg Tabs (Benazepril hcl) .... One tab by mouth qd 12)  Metformin Hcl 500 Mg Tabs (Metformin hcl) .... Take 1 tablet by mouth two times a day 13)  Multivitamins Tabs (Multiple vitamin) .... Take 1 tablet by mouth once a day 14)  Amlodipine Besylate 5 Mg Tabs (Amlodipine besylate) .... Take 1 tablet by mouth once a day 15)  Vitamin D (ergocalciferol) 50000 Unit Caps (Ergocalciferol) .... One cap by mouth every week 16)  Cymbalta 60 Mg Cpep (Duloxetine hcl) .... One cap by mouth two times a day 17)  Trilipix 135 Mg Cpdr (Choline fenofibrate) .... Take 1 tablet by mouth once a day 18)  Omeprazole 20 Mg Cpdr (Omeprazole) .... Take 1 tablet by mouth two times a day  Other Orders: T-Lipid Profile [phone] T-Hepatic Function [id number]) T-Vitamin D (25-Hydroxy) [phone]) T-Vitamin B12 [id number])  Patient Instructions: 1)  Please schedule a follow-up appointment in 3 months. 2)  It is important that you exercise regularly at least 20 minutes 5 times a week. If you develop chest pain, have severe difficulty breathing, or feel very tired , stop exercising immediately and seek medical attention. 3)  You need to lose weight. Consider a lower calorie diet and regular exercise.  4)  Pls cut BACK on sweets, your blood sugar control is not as good as it was. 5)  CONGRATS on quitting smokling Prescriptions: OMEPRAZOLE 20 MG CPDR (OMEPRAZOLE) Take 1 tablet by mouth two times a day  #60 x 3   Entered and Authorized by:   Tula Nakayama MD   Signed by:   Tula Nakayama MD on 03/14/2010   Method used:   Printed then faxed to ...       [facility]  [address] (retail)       [address] [place]  [address]       Ph: [phone]       Fax: [phone]   RxID:   [id number] [facility] (CHOLINE  FENOFIBRATE) Take 1 tablet by mouth once a day  #30 x 4   Entered and Authorized by:   Tula Nakayama MD   Signed by:   Tula Nakayama  MD on 03/14/2010   Method used:   Electronically to        [facility] (retail)       [address] [place]  [address]       Ph: [phone]       Fax: [phone]   RxID:   [id number]    Medication Administration  Injection # 1:    Medication: Ketorolac-Toradol 15mg     Diagnosis: BACK PAIN, CHRONIC (ICD-724.5)    Route: IM    Site: RUOQ gluteus    Exp Date: 10/2011    Lot #: [id number]     Mfr: [email or web address]    Comments: 60mg  given     Patient tolerated injection without complications    Given by: Kate Sable LPN (April 29, 624THL 075-GRM AM)  Orders Added: 1)  Est. Patient Level IV [id number] 2)  T-Lipid Profile [[id number]] 3)  T-Hepatic Function [80076-22960] 4)  T- Hemoglobin A1C [[id number]] 5)  T-Vitamin D (25-Hydroxy) [id number] 6)  T-Vitamin B12 [[id number]] 7)  Ketorolac-Toradol 15mg  [[id number]] 8)  Admin of Therapeutic Inj  intramuscular or subcutaneous [id number]

## 2010-12-18 NOTE — Assessment & Plan Note (Signed)
Summary: F UP   Vital Signs:  Patient profile:   65 year old female Menstrual status:  hysterectomy Height:      63 inches Weight:      211.75 pounds BMI:     37.65 O2 Sat:      98 % Pulse rate:   78 / minute Pulse rhythm:   regular Resp:     16 per minute BP sitting:   114 / 70  (left arm) Cuff size:   large  Vitals Entered By: Kate Sable LPN (July 29, 624THL QA348G AM)  Nutrition Counseling: Patient's BMI is greater than 25 and therefore counseled on weight management options. CC: FOllow up chronic problems   CC:  FOllow up chronic problems.  History of Present Illness: Reports  that she has been  doing fairly  well. Denies recent fever or chills. Denies sinus pressure, nasal congestion , ear pain or sore throat. Denies chest congestion, or cough productive of sputum. Denies chest pain, palpitations, PND, orthopnea or leg swelling.  Denies change in bowel movements or bloody stool. Denies dysuria , frequency, incontinence or hesitancy.  Denies headaches, vertigo, seizures. Denies depression, anxiety or insomnia. Denies  rash, lesions, or itch. tests once daily at differne ttimes, fastings are around 90 and post prandials around 130     Current Medications (verified): 1)  Levoxyl 50 Mcg  Tabs (Levothyroxine Sodium) .... Take One Tablet By Mouth Once A Day 2)  Bayer Aspirin Ec Low Dose 81 Mg  Tbec (Aspirin) .... Take One Tablet By Mouth Once A Day 3)  Calcium 500/d 500-200 Mg-Unit  Tabs (Calcium Carbonate-Vitamin D) .... Take One Tblaet By Mouth Three Times A Day 4)  Amitriptyline Hcl 25 Mg  Tabs (Amitriptyline Hcl) .... Take One Tablet By Mouth At Bedtime 5)  Clonazepam 1 Mg  Tabs (Clonazepam) .... Take One Tablet By Mouth Three Times A Day 6)  Loradamed 10 Mg  Tabs (Loratadine) .... Take 1 Tablet By Mouth Once A Day 7)  Simvastatin 20 Mg  Tabs (Simvastatin) .... Take 1 Tablet By Mouth Once A Day 8)  Soma 350 Mg  Tabs (Carisoprodol) .... Take 1 Tab By Mouth At  Bedtime 9)  Hydrocodone-Acetaminophen 10-500 Mg  Tabs (Hydrocodone-Acetaminophen) .... Take 1 Tablet By Mouth Four Times A Day 10)  Zolpidem Tartrate 10 Mg Tabs (Zolpidem Tartrate) .... One Tab By Mouth Qhs 11)  Lotensin 20 Mg Tabs (Benazepril Hcl) .... One Tab By Mouth Qd 12)  Metformin Hcl 500 Mg Tabs (Metformin Hcl) .... Take 1 Tablet By Mouth Two Times A Day 13)  Multivitamins  Tabs (Multiple Vitamin) .... Take 1 Tablet By Mouth Once A Day 14)  Amlodipine Besylate 5 Mg Tabs (Amlodipine Besylate) .... Take 1 Tablet By Mouth Once A Day 15)  Vitamin D (Ergocalciferol) 50000 Unit Caps (Ergocalciferol) .... One Cap By Mouth Every Week 16)  Cymbalta 60 Mg Cpep (Duloxetine Hcl) .... One Cap By Mouth Two Times A Day 17)  Trilipix 135 Mg Cpdr (Choline Fenofibrate) .... Take 1 Tablet By Mouth Once A Day 18)  Omeprazole 20 Mg Cpdr (Omeprazole) .... Take 1 Tablet By Mouth Two Times A Day 19)  Accu-Chek Aviva  Strp (Glucose Blood) .... Use As Directed For Once Daily Testing 20)  Accu-Chek Multiclix Lancets  Misc (Lancets) .... Use As Directed For Once Daily Testing 21)  Fish Oil 1000 Mg Caps (Omega-3 Fatty Acids) .... Take 1 Tablet By Mouth Three Times A Day  Allergies (verified): 1)  !  Demerol 2)  ! Darvocet 3)  ! * Niaspan  Review of Systems      See HPI General:  Complains of fatigue. Eyes:  Complains of vision loss-both eyes; corrected lenses. GI:  Complains of abdominal pain and loss of appetite; pt has had 2 different episodes of abdominal discomfort and fullness in thee past 3 months when attempts were made to band her abdomen at [facility], still working on this. MS:  Complains of joint pain and stiffness; increased left kneee pain and tenderness following trauma 1 week ago, still ambulating, 3 week h/o intermittent right shoulder pain with reduced mobility and weakness affecting even the right hand when it flares. Endo:  Denies cold intolerance, excessive hunger, excessive thirst, excessive  urination, heat intolerance, polyuria, and weight change.  Physical Exam  General:  Well-developed,obese,in no acute distress; alert,appropriate and cooperative throughout examination HEENT: No facial asymmetry,  EOMI, No sinus tenderness, TM's Clear, oropharynx  pink and moist.   Chest: Clear to auscultation bilaterally.  CVS: S1, S2, No murmurs, No S3.   Abd: Soft, Nontender.  MS: Adequate ROM spine, hips, shoulders and reduced in left knee, with tenderness  Ext: No edema.   CNS: CN 2-12 intact, power tone and sensation normal throughout.   Skin: Intact, multiple areas of purpura Psych: Good eye contact, normal affect.  Memory intact, not anxious or depressed appearing.    Impression & Recommendations:  Problem # 1:  KNEE PAIN, LEFT (ICD-719.46) Assessment Deteriorated  Her updated medication list for this problem includes:    Bayer Aspirin Ec Low Dose 81 Mg Tbec (Aspirin) .Marland Kitchen... Take one tablet by mouth once a day    Soma 350 Mg Tabs (Carisoprodol) .Marland Kitchen... Take 1 tab by mouth at bedtime    Hydrocodone-acetaminophen 10-500 Mg Tabs (Hydrocodone-acetaminophen) .Marland Kitchen... Take 1 tablet by mouth four times a day  Orders: Depo- Medrol 80mg  (J1040) Ketorolac-Toradol 15mg  [id number]) Admin of patients own med IM/SQ [id number])  Problem # 2:  HYPERLIPIDEMIA (ICD-272.4) Assessment: Deteriorated  The following medications were removed from the medication list:    Simvastatin 20 Mg Tabs (Simvastatin) .Marland Kitchen... Take 1 tablet by mouth once a day Her updated medication list for this problem includes:    Trilipix 135 Mg Cpdr (Choline fenofibrate) .Marland Kitchen... Take 1 tablet by mouth once a day    Simvastatin 40 Mg Tabs (Simvastatin) .Marland Kitchen... Take 1 tab by mouth at bedtime  Orders: T-Hepatic Function [phone]) T-Lipid Profile [phone])  Labs Reviewed: SGOT: 15 (05/13/2010)   SGPT: <8 U/L (05/13/2010)   HDL:45 (05/13/2010), 54 (01/02/2010)  LDL:105 (05/13/2010), 98 (01/02/2010)  Chol:182 (05/13/2010), 194  (01/02/2010)  Trig:162 (05/13/2010), 212 (01/02/2010)  Problem # 3:  BACK PAIN, CHRONIC (ICD-724.5) Assessment: Unchanged  Her updated medication list for this problem includes:    Bayer Aspirin Ec Low Dose 81 Mg Tbec (Aspirin) .Marland Kitchen... Take one tablet by mouth once a day    Soma 350 Mg Tabs (Carisoprodol) .Marland Kitchen... Take 1 tab by mouth at bedtime    Hydrocodone-acetaminophen 10-500 Mg Tabs (Hydrocodone-acetaminophen) .Marland Kitchen... Take 1 tablet by mouth four times a day  Discussed use of moist heat or ice, modified activities, medications, and stretching/strengthening exercises. Back care instructions given. To be seen in 2 weeks if no improvement; sooner if worsening of symptoms.   Problem # 4:  OBESITY, UNSPECIFIED (ICD-278.00) Assessment: Deteriorated  Ht: 63 (06/13/2010)   Wt: 211.75 (06/13/2010)   BMI: 37.65 (06/13/2010)  Problem # 5:  ESSENTIAL HYPERTENSION, BENIGN (ICD-401.1) Assessment: Improved  The following  medications were removed from the medication list:    Amlodipine Besylate 5 Mg Tabs (Amlodipine besylate) .Marland Kitchen... Take 1 tablet by mouth once a day Her updated medication list for this problem includes:    Lotensin 20 Mg Tabs (Benazepril hcl) ..... One tab by mouth qd  Orders: T-Basic Metabolic Panel ([id number])  BP today: 114/70 Prior BP: 130/70 (03/14/2010)  Labs Reviewed: K+: 5.4 (01/02/2010) Creat: : 1.00 (01/02/2010)   Chol: 182 (05/13/2010)   HDL: 45 (05/13/2010)   LDL: 105 (05/13/2010)   TG: 162 (05/13/2010)  Problem # 6:  DIABETES MELLITUS, TYPE II, WITHOUT COMPLICATIONS ([id number]) Assessment: Improved  Her updated medication list for this problem includes:    Bayer Aspirin Ec Low Dose 81 Mg Tbec (Aspirin) .Marland Kitchen... Take one tablet by mouth once a day    Lotensin 20 Mg Tabs (Benazepril hcl) ..... One tab by mouth qd    Metformin Hcl 500 Mg Tabs (Metformin hcl) .Marland Kitchen... Take 1 tablet by mouth two times a day  Orders: T- Hemoglobin A1C [id number])  Labs  Reviewed: Creat: 1.00 (01/02/2010)    Reviewed HgBA1c results: 6.3 (05/13/2010)  6.5 (01/24/2010)  Complete Medication List: 1)  Levoxyl 50 Mcg Tabs (Levothyroxine sodium) .... Take one tablet by mouth once a day 2)  Bayer Aspirin Ec Low Dose 81 Mg Tbec (Aspirin) .... Take one tablet by mouth once a day 3)  Calcium 500/d 500-200 Mg-unit Tabs (Calcium carbonate-vitamin d) .... Take one tblaet by mouth three times a day 4)  Amitriptyline Hcl 25 Mg Tabs (Amitriptyline hcl) .... Take one tablet by mouth at bedtime 5)  Clonazepam 1 Mg Tabs (Clonazepam) .... Take one tablet by mouth three times a day 6)  Loradamed 10 Mg Tabs (Loratadine) .... Take 1 tablet by mouth once a day 7)  Soma 350 Mg Tabs (Carisoprodol) .... Take 1 tab by mouth at bedtime 8)  Hydrocodone-acetaminophen 10-500 Mg Tabs (Hydrocodone-acetaminophen) .... Take 1 tablet by mouth four times a day 9)  Zolpidem Tartrate 10 Mg Tabs (Zolpidem tartrate) .... One tab by mouth qhs 10)  Lotensin 20 Mg Tabs (Benazepril hcl) .... One tab by mouth qd 11)  Metformin Hcl 500 Mg Tabs (Metformin hcl) .... Take 1 tablet by mouth two times a day 12)  Multivitamins Tabs (Multiple vitamin) .... Take 1 tablet by mouth once a day 13)  Cymbalta 60 Mg Cpep (Duloxetine hcl) .... One cap by mouth two times a day 14)  Trilipix 135 Mg Cpdr (Choline fenofibrate) .... Take 1 tablet by mouth once a day 15)  Omeprazole 20 Mg Cpdr (Omeprazole) .... Take 1 tablet by mouth two times a day 16)  Accu-chek Aviva Strp (Glucose blood) .... Use as directed for once daily testing 17)  Accu-chek Multiclix Lancets Misc (Lancets) .... Use as directed for once daily testing 18)  Fish Oil 1000 Mg Caps (Omega-3 fatty acids) .... Take 1 tablet by mouth three times a day 19)  Simvastatin 40 Mg Tabs (Simvastatin) .... Take 1 tab by mouth at bedtime  Other Orders: Radiology Referral (Radiology) T-TSH [phone]) T-TSH [phone]) Tdap => 40yrs IM [id number]) Admin 1st Vaccine  [phone]) Admin 1st Vaccine [facility]) [phone])  Patient Instructions: 1)  Please schedule a follow-up appointment in 3.5 months. 2)  It is important that you exercise regularly at least 20 minutes 5 times a week. If you develop chest pain, have severe difficulty breathing, or feel very tired , stop exercising immediately and seek medical attention. 3)  You need to lose  weight. Consider a lower calorie diet and regular exercise.  4)  tSH today 5)  BMP prior to visit, ICD-9: 6)  Hepatic Panel prior to visit, ICD-9:   fasting in 3.5 months 7)  Lipid Panel prior to visit, ICD-9: 8)  TSH prior to visit, ICD-9: 9)  HbgA1C prior to visit, ICD-9: 10)  Two injections in the office today for your knee. 11)  inc dose of simvastatin to 40mg  daily, your LDL is too high, follow low fat diet pls. 12)  take TWO 20mg  sivastatin tabs till done Prescriptions: CYMBALTA 60 MG CPEP (DULOXETINE HCL) one cap by mouth two times a day  #60 x 2   Entered by:   Kate Sable LPN   Authorized by:   Tula Nakayama MD   Signed by:   Kate Sable LPN on [id number]   Method used:   Printed then faxed to ...       [facility]  [address] [address]       [address] [place] [address]  [address]       Ph: [phone]       Fax: [phone]   RxID:   [id number] METFORMIN HCL 500 MG TABS (METFORMIN HCL) Take 1 tablet by mouth two times a day  #60 Tablet x 3   Entered by:   Kate Sable LPN   Authorized by:   Tula Nakayama MD   Signed by:   Kate Sable LPN on [id number]   Method used:   Printed then faxed to ...       [facility]  [address]       [address] [place]  [address]       Ph: [phone]       Fax: [phone]   RxID:   [id number] LOTENSIN 20 MG TABS (BENAZEPRIL HCL) one tab by mouth qd  #30 Tablet x 3   Entered by:   Kate Sable LPN   Authorized by:   Tula Nakayama MD   Signed by:   Kate Sable LPN on [id number]   Method used:   Printed then faxed to ...       [facility]  [address]       [address] [place]  [address]       Ph: [phone]       Fax: [phone]   RxID:   [id number] HYDROCODONE-ACETAMINOPHEN 10-500 MG  TABS (HYDROCODONE-ACETAMINOPHEN) Take 1 tablet by mouth four times a day  #120 x 2   Entered by:   Kate Sable LPN   Authorized by:   Tula Nakayama MD   Signed by:   Kate Sable LPN on QA348G   Method used:   Printed then faxed to ...       [facility]  [address]       [address] [place] [address]  [address]       Ph: [phone]       Fax: [phone]   RxID:   [id number] CLONAZEPAM 1 MG  TABS (CLONAZEPAM) Take one tablet by mouth three times a day  #90 x 3   Entered by:   Kate Sable LPN   Authorized by:   Tula Nakayama MD   Signed by:   Kate Sable LPN on QA348G   Method used:  Printed then faxed to ...       [facility]  [address]       [address] [place]  [address]       Ph: [phone]       Fax: [phone]   RxID:   [id number] AMITRIPTYLINE HCL 25 MG  TABS (AMITRIPTYLINE HCL) Take one tablet by mouth at bedtime  #30 Tablet x 3   Entered by:   Kate Sable LPN   Authorized by:   Tula Nakayama MD   Signed by:   Kate Sable LPN on QA348G   Method used:   Printed then faxed to ...       [facility]  [address]       [address] [place]  [address]       Ph: [phone]       Fax: [phone]   RxID:   [id number] SIMVASTATIN 40 MG TABS (SIMVASTATIN) Take 1 tab by mouth at bedtime  #30 x 4   Entered and Authorized by:   Tula Nakayama MD   Signed by:   Tula Nakayama MD on 06/13/2010   Method used:   Printed then faxed to ...       [facility]  [address]       [address] [place] [address]  [address]       Ph: [phone]       Fax: [phone]   RxID:   [id number]       Tetanus/Td Vaccine    Vaccine Type: Tdap    Site: left deltoid    Mfr: boostrix    Dose: 0.5 ml    Route: IM    Given by: Kate Sable LPN    Exp.  Date: 02/08/2012    Lot #: [id number]     Medication Administration  Injection # 1:    Medication: Depo- Medrol 80mg     Diagnosis: KNEE PAIN, LEFT (ICD-719.46)    Route: IM    Site: RUOQ gluteus    Exp Date: 03/2011    Lot #: [id number]     Mfr: Pharmacia    Comments: 80mg  given     Patient tolerated injection without complications    Given by: Kate Sable LPN (July 29, 624THL [id number] AM)  Injection # 2:    Medication: Ketorolac-Toradol 15mg     Diagnosis: KNEE PAIN, LEFT (ICD-719.46)    Route: IM    Site: LUOQ gluteus    Exp Date: 01/2012    Lot #: [id number]     Mfr: novaplus    Comments: 60mg  given     Patient tolerated injection without complications    Given by: Kate Sable LPN (July 29, 624THL 624THL AM)  Orders Added: 1)  Est. Patient Level IV [id number] 2)  Radiology Referral [Radiology] 3)  T-Basic Metabolic Panel 0000000 4)  T-Hepatic Function [80076-22960] 5)  T-Lipid Profile [80061-22930] 6)  T-TSH [id number] 7)  T- Hemoglobin A1C [83036-23375] 8)  T-TSH [id number] 9)  Tdap => 59yrs IM [id number] 10)  Admin 1st Vaccine [90471] 11)  Admin 1st Vaccine (State) [id number])  Depo- Medrol 80mg  [J1040] 13)  Ketorolac-Toradol 15mg  [J1885] 14)  Admin of patients own med IM/SQ [id number]

## 2010-12-18 NOTE — Progress Notes (Signed)
[facility]   Im[facility] By: Dierdre Harness 01/31/2010 08:15:13  _____________________________________________________________________  External Attachment:    Type:   Image     Comment:   External Document

## 2010-12-18 NOTE — Letter (Signed)
Summary: [facility]   Imported By: Dierdre Harness 05/22/2010 14:10:06  _____________________________________________________________________  External Attachment:    Type:   Image     Comment:   External Document

## 2010-12-18 NOTE — Progress Notes (Signed)
Summary: STRESS TEST  STRESS TEST   Imported By: Dierdre Harness 02/25/2010 12:55:59  _____________________________________________________________________  External Attachment:    Type:   Image     Comment:   External Document

## 2010-12-18 NOTE — Progress Notes (Signed)
Summary: loratab 10 mg  Phone Note Call from Patient   Summary of Call: patient would like [facility] [address], [place]. call her back when done [phone] Initial call taken by: Eliezer Mccoy,  October 07, 2010 9:36 AM    Prescriptions: HYDROCODONE-ACETAMINOPHEN 10-500 MG  TABS (HYDROCODONE-ACETAMINOPHEN) Take 1 tablet by mouth four times a day  #120 x 3   Entered by:   Baldomero Lamy LPN   Authorized by:   Tula Nakayama MD   Signed by:   Baldomero Lamy LPN on [id number]   Method used:   Printed then faxed to ...       [facility]  [address]       [address] [place]  [address]       Ph: [phone]       Fax: [phone]   RxID:   [id number]

## 2010-12-18 NOTE — Assessment & Plan Note (Signed)
Summary: FOLLOW UP   Vital Signs:  Patient profile:   65 year old female Menstrual status:  hysterectomy Height:      63 inches Weight:      209.75 pounds BMI:     37.29 O2 Sat:      98 % on Room air Pulse rate:   81 / minute Pulse rhythm:   regular Resp:     16 per minute BP sitting:   180 / 90  (left arm)  Vitals Entered By: Baldomero Lamy LPN (March 11, 624THL 624THL AM)  Nutrition Counseling: Patient's BMI is greater than 25 and therefore counseled on weight management options. CC: follow-up visit Is Patient Diabetic? Yes Did you bring your meter with you today? No Pain Assessment Patient in pain? yes     Location: right shoulder Intensity: 8 Type: aching Onset of pain  Constant   CC:  follow-up visit.  History of Present Illness: Reports  that she has been  doing well. Denies recent fever or chills. Denies sinus pressure, nasal congestion , ear pain or sore throat. Denies chest congestion, or cough productive of sputum. Denies chest pain, palpitations, PND, orthopnea or leg swelling. Denies abdominal pain, nausea, vomitting, diarrhea or constipation. Denies change in bowel movements or bloody stool. Denies dysuria , frequency, incontinence or hesitancy. reports right shoulder pain ad reduced mobility with no specific aggravating factor.. Denies headaches, vertigo, seizures. Denies depression, anxiety or insomnia. Denies  rash, lesions, or itch.     Allergies: 1)  ! Demerol 2)  ! Darvocet  Review of Systems ENT:  Complains of nasal congestion and sinus pressure; 1 month  history, no fever, chills or green drainage. MS:  Complains of joint pain and stiffness; right shoulder pain with weakness and stifness , also weak right hand intermiottentkly. Endo:  Denies cold intolerance, excessive hunger, excessive thirst, excessive urination, heat intolerance, polyuria, and weight change; fasting sugarsare seldom over 110, pt seldom tests more ofte than 4 times  weekly. Heme:  Denies abnormal bruising and bleeding. Allergy:  Complains of seasonal allergies.  Physical Exam  General:  Well-developed,obese,in no acute distress; alert,appropriate and cooperative throughout examination HEENT: No facial asymmetry,  EOMI, No sinus tenderness, TM's Clear, oropharynx  pink and moist.   Chest: Clear to auscultation bilaterally.  CVS: S1, S2, No murmurs, No S3.   Abd: Soft, Nontender.  MS: decreased  ROM spine,adequate in  hips, reduced in right shoulder and adequate in knees.  Ext: No edema.   CNS: CN 2-12 intact, power tone and sensation normal throughout.   Skin: Intact, no visible lesions or rashes.  Psych: Good eye contact, normal affect.  Memory intact, not anxious or depressed appearing.   Diabetes Management Exam:    Foot Exam (with socks and/or shoes not present):       Sensory-Monofilament:          Left foot: diminished          Right foot: diminished       Inspection:          Left foot: normal          Right foot: normal       Nails:          Left foot: thickened          Right foot: thickened   Impression & Recommendations:  Problem # 1:  SHOULDER PAIN, RIGHT (ICD-719.41) Assessment Deteriorated  Her updated medication list for this problem includes:  Bayer Aspirin Ec Low Dose 81 Mg Tbec (Aspirin) .Marland Kitchen... Take one tablet by mouth once a day    Soma 350 Mg Tabs (Carisoprodol) .Marland Kitchen... Take 1 tab by mouth at bedtime    Hydrocodone-acetaminophen 10-500 Mg Tabs (Hydrocodone-acetaminophen) .Marland Kitchen... Take 1 tablet by mouth four times a day  Orders: Orthopedic Referral (Ortho)  Problem # 2:  BACK PAIN, CHRONIC (ICD-724.5) Assessment: Unchanged  Her updated medication list for this problem includes:    Bayer Aspirin Ec Low Dose 81 Mg Tbec (Aspirin) .Marland Kitchen... Take one tablet by mouth once a day    Soma 350 Mg Tabs (Carisoprodol) .Marland Kitchen... Take 1 tab by mouth at bedtime    Hydrocodone-acetaminophen 10-500 Mg Tabs (Hydrocodone-acetaminophen)  .Marland Kitchen... Take 1 tablet by mouth four times a day  Problem # 3:  NICOTINE ADDICTION (ICD-305.1) Assessment: Unchanged  Encouraged smoking cessation and discussed different methods for smoking cessation.   Problem # 4:  HYPERLIPIDEMIA (P102836.4) Assessment: Comment Only  Her updated medication list for this problem includes:    Simvastatin 20 Mg Tabs (Simvastatin) .Marland Kitchen... Take 1 tablet by mouth once a day    Niaspan 500 Mg Cr-tabs (Niacin (antihyperlipidemic)) .Marland Kitchen..Marland Kitchen Two tabs by mouth at bedtime, STATES intolerant of naspan, achieved flushing, advise duse of asprin 32 mg 30 mins before taking the med  Labs Reviewed: SGOT: 12 (01/02/2010)   SGPT: <8 U/L (01/02/2010)   HDL:54 (01/02/2010), 50 (06/21/2009)  LDL:98 (01/02/2010), 94 (06/21/2009)  Chol:194 (01/02/2010), 179 (06/21/2009)  Trig:212 (01/02/2010), 177 (06/21/2009)  Problem # 5:  ESSENTIAL HYPERTENSION, BENIGN (ICD-401.1) Assessment: Deteriorated  Her updated medication list for this problem includes:    Lotensin 20 Mg Tabs (Benazepril hcl) ..... One tab by mouth qd    Amlodipine Besylate 5 Mg Tabs (Amlodipine besylate) .Marland Kitchen... Take 1 tablet by mouth once a day  Orders: Cardiology Referral (Cardiology), reprts recent chest dscomfort, primarily associated with emotional stress, she has seen cardiology in the past AND requests f/u be scheduled  BP today: 180/90 Prior BP: 120/80 (09/20/2009)  Labs Reviewed: K+: 5.4 (01/02/2010) Creat: : 1.00 (01/02/2010)   Chol: 194 (01/02/2010)   HDL: 54 (01/02/2010)   LDL: 98 (01/02/2010)   TG: 212 (01/02/2010)  Problem # 6:  DIABETES MELLITUS, TYPE II, WITHOUT COMPLICATIONS (XX123456) Assessment: Comment Only  Her updated medication list for this problem includes:    Bayer Aspirin Ec Low Dose 81 Mg Tbec (Aspirin) .Marland Kitchen... Take one tablet by mouth once a day    Lotensin 20 Mg Tabs (Benazepril hcl) ..... One tab by mouth qd    Metformin Hcl 500 Mg Tabs (Metformin hcl) .Marland Kitchen... Take 1 tablet by mouth  two times a day  Orders: T- Hemoglobin A1C [id number]) Cardiology Referral (Cardiology)  Labs Reviewed: Creat: 1.00 (01/02/2010)    Reviewed HgBA1c results: 5.8 (09/20/2009)  6.1 (05/31/2009)  Problem # 7:  OBESITY, UNSPECIFIED (ICD-278.00) Assessment: Improved  Ht: 63 (01/24/2010)   Wt: 209.75 (01/24/2010)   BMI: 37.29 (01/24/2010)  Complete Medication List: 1)  Omeprazole 40 Mg Cpdr (Omeprazole) .... Take 1 tablet by mouth once a day 2)  Levoxyl 50 Mcg Tabs (Levothyroxine sodium) .... Take one tablet by mouth once a day 3)  Bayer Aspirin Ec Low Dose 81 Mg Tbec (Aspirin) .... Take one tablet by mouth once a day 4)  Calcium 500/d 500-200 Mg-unit Tabs (Calcium carbonate-vitamin d) .... Take one tblaet by mouth three times a day 5)  Amitriptyline Hcl 25 Mg Tabs (Amitriptyline hcl) .... Take one tablet by mouth at  bedtime 6)  Clonazepam 1 Mg Tabs (Clonazepam) .... Take one tablet by mouth three times a day 7)  Loradamed 10 Mg Tabs (Loratadine) .... Take 1 tablet by mouth once a day 8)  Simvastatin 20 Mg Tabs (Simvastatin) .... Take 1 tablet by mouth once a day 9)  Soma 350 Mg Tabs (Carisoprodol) .... Take 1 tab by mouth at bedtime 10)  Hydrocodone-acetaminophen 10-500 Mg Tabs (Hydrocodone-acetaminophen) .... Take 1 tablet by mouth four times a day 11)  Zolpidem Tartrate 10 Mg Tabs (Zolpidem tartrate) .... One tab by mouth qhs 12)  Lotensin 20 Mg Tabs (Benazepril hcl) .... One tab by mouth qd 13)  Metformin Hcl 500 Mg Tabs (Metformin hcl) .... Take 1 tablet by mouth two times a day 14)  Multivitamins Tabs (Multiple vitamin) .... Take 1 tablet by mouth once a day 15)  Niaspan 500 Mg Cr-tabs (Niacin (antihyperlipidemic)) .... Two tabs by mouth at bedtime 16)  Bupropion Hcl 75 Mg Tabs (Bupropion hcl) .... Take 1 tablet by mouth once a day for 1 week then one tablet twice daily for 3 weeks, start 01/24/2010 17)  Bupropion Hcl 75 Mg Tabs (Bupropion hcl) .... Take 1 tablet by mouth two times  a day 18)  Amlodipine Besylate 5 Mg Tabs (Amlodipine besylate) .... Take 1 tablet by mouth once a day  Other Orders: T-Vitamin D (25-Hydroxy) [id number]) T-Anemia Panel 3  (2904) T-CBC w/Diff [id number])  Patient Instructions: 1)  Please schedule a follow-up appointment in 6 weeks 2)  Tobacco is very bad for your health and your loved ones! You Should stop smoking!. 3)  Stop Smoking Tips: Choose a Quit date. Cut down before the Quit date. decide what you will do as a substitute when you feel the urge to smoke(gum,toothpick,exercise). 4)  It is important that you exercise regularly at least 20 minutes 5 times a week. If you develop chest pain, have severe difficulty breathing, or feel very tired , stop exercising immediately and seek medical attention. 5)  You need to lose weight. Consider a lower calorie diet and regular exercise.  6)  CBC w/ Diff prior to visit, ICD-9: 7)  HbgA1C prior to visit, ICD-9:  today 8)  Anemia panel 9)  Vit D level 10)  You will be referred to cardiology and to ortho 11)  You need to start saline flushes to nostrils for uncontrolled congestion, your BP is high pls add a new med as duiscussed, also, new med for depression, and try the niacin taking asprin 325 mg 1 tab 30 minutes before the niacin. Prescriptions: AMLODIPINE BESYLATE 5 MG TABS (AMLODIPINE BESYLATE) Take 1 tablet by mouth once a day  #30 x 3   Entered by:   Tula Nakayama MD   Authorized by:   Kennith Gain PA   Signed by:   Tula Nakayama MD on 01/24/2010   Method used:   Electronically to        [facility]. [phone]* (retail)       [address]       [place], [place]  [address]       Ph: [phone] or [phone]       Fax: [phone]   RxID:   [id number] BUPROPION HCL 75 MG TABS (BUPROPION HCL) Take 1 tablet by mouth two times a day  #60 x 3   Entered by:   Tula Nakayama MD   Authorized by:   Kennith Gain PA  Signed by:   Tula Nakayama MD on  01/24/2010   Method used:   Electronically to        [facility]. [phone]* (retail)       [address]       [place], [place]  [address]       Ph: [phone] or [phone]       Fax: [phone]   RxID:   [id number] BUPROPION HCL 75 MG TABS (BUPROPION HCL) Take 1 tablet by mouth once a day for 1 week then one tablet twice daily for 3 weeks, start 01/24/2010  #49 x 0   Entered by:   Tula Nakayama MD   Authorized by:   Kennith Gain PA   Signed by:   Tula Nakayama MD on 01/24/2010   Method used:   Electronically to        [facility]. [phone]* (retail)       [address]       [place], [place]  [address]       Ph: [phone] or [phone]       Fax: [phone]   RxID:   [id number]

## 2010-12-18 NOTE — Progress Notes (Signed)
Summary: GOUT  Phone Note Call from Patient   Summary of Call: NEEDS SOMETHING CALLED IN [facility] GOUT AT [facility] [address] [facility] [place] AT [id number] Initial call taken by: Dierdre Harness,  August 18, 2010 11:34 AM  Follow-up for Phone Call        needs appt today with pA re gout, no record of being on med for c gout chronically Follow-up by: Tula Nakayama MD,  August 18, 2010 12:02 PM  Additional Follow-up for Phone Call Additional follow up Details #1::        pt was called and told sister that she needed to make appt with PA for gout Additional Follow-up by: Lenn Cal,  August 18, 2010 2:00 PM

## 2010-12-18 NOTE — Letter (Signed)
Summary: CONSULTS  CONSULTS   Imported By: Dierdre Harness 05/22/2010 14:12:38  _____________________________________________________________________  External Attachment:    Type:   Image     Comment:   External Document

## 2010-12-18 NOTE — Progress Notes (Signed)
  Phone Note Call from Patient   Caller: Patient Summary of Call: [facility] [address] Initial call taken by: Baldomero Lamy LPN,  October 31, 624THL 9:08 AM  Follow-up for Phone Call        [facility] attempted to notify pt , nop resopnse , pls call and let her know n the morning Follow-up by: Tula Nakayama MD,  September 15, 2010 6:20 PM  Additional Follow-up for Phone Call Additional follow up Details #1::        patient aware Additional Follow-up by: Baldomero Lamy LPN,  November  1, 624THL 9:09 AM    New/Updated Medications: INDOMETHACIN 50 MG CAPS (INDOMETHACIN) one capsule 3 times daily for 2 days , then twice daily for 2 days , then 1 daily for 3 days Prescriptions: INDOMETHACIN 50 MG CAPS (INDOMETHACIN) one capsule 3 times daily for 2 days , then twice daily for 2 days , then 1 daily for 3 days  #13 x 0   Entered and Authorized by:   Tula Nakayama MD   Signed by:   Tula Nakayama MD on 09/15/2010   Method used:   Electronically to        [facility] (retail)       [address] [place]  [address]       Ph: [phone]       Fax: [phone]   RxID:   [id number]

## 2010-12-18 NOTE — Letter (Signed)
Summary: [facility]   Imported By: Dierdre Harness 12/09/2010 10:05:57  _____________________________________________________________________  External Attachment:    Type:   Image     Comment:   External Document

## 2010-12-18 NOTE — Letter (Signed)
Summary: DEMO  DEMO   Imported By: Dierdre Harness 05/22/2010 13:58:37  _____________________________________________________________________  External Attachment:    Type:   Image     Comment:   External Document

## 2010-12-18 NOTE — Progress Notes (Signed)
[facility]   Imported By: Dierdre Harness 03/11/2010 10:13:26  _____________________________________________________________________  External Attachment:    Type:   Image     Comment:   External Document

## 2010-12-18 NOTE — Letter (Signed)
Summary: Letter  Letter   Imported By: Dierdre Harness 01/29/2010 11:37:13  _____________________________________________________________________  External Attachment:    Type:   Image     Comment:   External Document

## 2010-12-18 NOTE — Assessment & Plan Note (Signed)
Summary: foot   Vital Signs:  Patient profile:   65 year old female Menstrual status:  hysterectomy Height:      63 inches Weight:      203 pounds O2 Sat:      98 % on Room air Pulse rate:   79 / minute Resp:     16 per minute BP sitting:   110 / 82  (left arm)  Vitals Entered By: Louie Casa, CMA CC: follow up. Gout flare   CC:  follow up. Gout flare.  History of Present Illness: Pt presents with c/o Rt ankle pain and swelling.  States is a little red also.  No trauma.  Started yesterday.  Hx of gout in Rt ankle, and usually takes Indomethacin for it.  Per pt same as her previous gout flare ups.  Otherwise is feeling well. No other complaints or concerns.   Current Medications (verified): 1)  Levoxyl 50 Mcg  Tabs (Levothyroxine Sodium) .... Take One Tablet By Mouth Once A Day 2)  Bayer Aspirin Ec Low Dose 81 Mg  Tbec (Aspirin) .... Take One Tablet By Mouth Once A Day 3)  Calcium 500/d 500-200 Mg-Unit  Tabs (Calcium Carbonate-Vitamin D) .... Take One Tblaet By Mouth Three Times A Day 4)  Amitriptyline Hcl 25 Mg  Tabs (Amitriptyline Hcl) .... Take One Tablet By Mouth At Bedtime 5)  Clonazepam 1 Mg  Tabs (Clonazepam) .... Take One Tablet By Mouth Three Times A Day 6)  Loradamed 10 Mg  Tabs (Loratadine) .... Take 1 Tablet By Mouth Once A Day 7)  Soma 350 Mg  Tabs (Carisoprodol) .... Take 1 Tab By Mouth At Bedtime 8)  Hydrocodone-Acetaminophen 10-500 Mg  Tabs (Hydrocodone-Acetaminophen) .... Take 1 Tablet By Mouth Four Times A Day 9)  Zolpidem Tartrate 10 Mg Tabs (Zolpidem Tartrate) .... One Tab By Mouth Qhs 10)  Lotensin 20 Mg Tabs (Benazepril Hcl) .... One Tab By Mouth Qd 11)  Metformin Hcl 500 Mg Tabs (Metformin Hcl) .... Take 1 Tablet By Mouth Two Times A Day 12)  Multivitamins  Tabs (Multiple Vitamin) .... Take 1 Tablet By Mouth Once A Day 13)  Cymbalta 60 Mg Cpep (Duloxetine Hcl) .... One Cap By Mouth Two Times A Day 14)  Trilipix 135 Mg Cpdr (Choline Fenofibrate) ....  Take 1 Tablet By Mouth Once A Day 15)  Ranitidine Hcl 150 Mg Tabs (Ranitidine Hcl) .... Take 1 Tablet By Mouth Two Times A Day 16)  Accu-Chek Aviva  Strp (Glucose Blood) .... Use As Directed For Once Daily Testing 17)  Accu-Chek Multiclix Lancets  Misc (Lancets) .... Use As Directed For Once Daily Testing 18)  Fish Oil 1000 Mg Caps (Omega-3 Fatty Acids) .... Take 1 Tablet By Mouth Three Times A Day 19)  Simvastatin 40 Mg Tabs (Simvastatin) .... Take 1 Tab By Mouth At Bedtime  Allergies (verified): 1)  ! Demerol 2)  ! Darvocet 3)  ! * Niaspan  Past History:  Past medical history reviewed for relevance to current acute and chronic problems.  Past Medical History: Reviewed history from 12/09/2007 and no changes required.   VITAMIN B12 DEFICIENCY (ICD-266.2) BACK PAIN, CHRONIC (ICD-724.5) GERD (ICD-530.81) NICOTINE ADDICTION (ICD-305.1) HYPERLIPIDEMIA (ICD-272.4) OBESITY, UNSPECIFIED (ICD-278.00) ESSENTIAL HYPERTENSION, BENIGN (ICD-401.1) DIABETES MELLITUS, TYPE II, WITHOUT COMPLICATIONS (XX123456) CONJUNCTIVITIS, ACUTE (ICD-372.00)  Review of Systems General:  Denies chills and fever. CV:  Denies chest pain or discomfort and swelling of feet. Resp:  Denies shortness of breath. MS:  Complains of joint pain,  joint redness, and joint swelling.  Physical Exam  General:  Well-developed,well-nourished,in no acute distress; alert,appropriate and cooperative throughout examination Head:  Normocephalic and atraumatic without obvious abnormalities. No apparent alopecia or balding. Lungs:  Normal respiratory effort, chest expands symmetrically. Lungs are clear to auscultation, no crackles or wheezes. Heart:  Normal rate and regular rhythm. S1 and S2 normal without gallop, murmur, click, rub or other extra sounds. Msk:  Mild edema noted lateral Rt ankle without erythema or warmth to touch. TTP inferior to lateral malleolus.  FROm. Pulses:  R posterior tibial normal, R dorsalis pedis  normal, L posterior tibial normal, and L dorsalis pedis normal.   Extremities:  No PTE Neurologic:  alert & oriented X3, sensation intact to light touch, and gait normal.   Skin:  color normal, no rashes, no suspicious lesions, and no ecchymoses.   Psych:  Cognition and judgment appear intact. Alert and cooperative with normal attention span and concentration. No apparent delusions, illusions, hallucinations   Impression & Recommendations:  Problem # 1:  ANKLE PAIN, RIGHT (ICD-719.47) Assessment New Appears more typical of a sprain, but pt insists that this is what her gout flare ups are like.  Orders: T-CBC w/Diff [id number]) T-Uric Acid (Blood) [id number]) Depo- Medrol 80mg  (J1040) Ketorolac-Toradol 15mg  [id number]) Admin of Therapeutic Inj  intramuscular or subcutaneous [id number])  Problem # 2:  ESSENTIAL HYPERTENSION, BENIGN (ICD-401.1)  Her updated medication list for this problem includes:    Lotensin 20 Mg Tabs (Benazepril hcl) ..... One tab by mouth qd  BP today: 110/82 Prior BP: 114/70 (06/13/2010)  Labs Reviewed: K+: 5.4 (01/02/2010) Creat: : 1.00 (01/02/2010)   Chol: 182 (05/13/2010)   HDL: 45 (05/13/2010)   LDL: 105 (05/13/2010)   TG: 162 (05/13/2010)  Complete Medication List: 1)  Levoxyl 50 Mcg Tabs (Levothyroxine sodium) .... Take one tablet by mouth once a day 2)  Bayer Aspirin Ec Low Dose 81 Mg Tbec (Aspirin) .... Take one tablet by mouth once a day 3)  Calcium 500/d 500-200 Mg-unit Tabs (Calcium carbonate-vitamin d) .... Take one tblaet by mouth three times a day 4)  Amitriptyline Hcl 25 Mg Tabs (Amitriptyline hcl) .... Take one tablet by mouth at bedtime 5)  Clonazepam 1 Mg Tabs (Clonazepam) .... Take one tablet by mouth three times a day 6)  Loradamed 10 Mg Tabs (Loratadine) .... Take 1 tablet by mouth once a day 7)  Soma 350 Mg Tabs (Carisoprodol) .... Take 1 tab by mouth at bedtime 8)  Hydrocodone-acetaminophen 10-500 Mg Tabs (Hydrocodone-acetaminophen) ....  Take 1 tablet by mouth four times a day 9)  Zolpidem Tartrate 10 Mg Tabs (Zolpidem tartrate) .... One tab by mouth qhs 10)  Lotensin 20 Mg Tabs (Benazepril hcl) .... One tab by mouth qd 11)  Metformin Hcl 500 Mg Tabs (Metformin hcl) .... Take 1 tablet by mouth two times a day 12)  Multivitamins Tabs (Multiple vitamin) .... Take 1 tablet by mouth once a day 13)  Cymbalta 60 Mg Cpep (Duloxetine hcl) .... One cap by mouth two times a day 14)  Trilipix 135 Mg Cpdr (Choline fenofibrate) .... Take 1 tablet by mouth once a day 15)  Ranitidine Hcl 150 Mg Tabs (Ranitidine hcl) .... Take 1 tablet by mouth two times a day 16)  Accu-chek Aviva Strp (Glucose blood) .... Use as directed for once daily testing 17)  Accu-chek Multiclix Lancets Misc (Lancets) .... Use as directed for once daily testing 18)  Fish Oil 1000 Mg Caps (Omega-3 fatty acids) .Marland KitchenMarland KitchenMarland Kitchen  Take 1 tablet by mouth three times a day 19)  Simvastatin 40 Mg Tabs (Simvastatin) .... Take 1 tab by mouth at bedtime 20)  Indomethacin 50 Mg Caps (Indomethacin) .... Take 1 capsule three times a day  Patient Instructions: 1)  Keep your appt with Dr Moshe Cipro in November for your regular check up. 2)  I have refilled your Indomethacin. 3)  You have received Toradol for pain and Depo Medrol for inflammation today. 4)  Elevate and ice your ankle to help with swelling and discomfort. 5)  Have blood work drawn today. Prescriptions: INDOMETHACIN 50 MG CAPS (INDOMETHACIN) take 1 capsule three times a day  #30 x 0   Entered and Authorized by:   Kennith Gain PA   Signed by:   Kennith Gain PA on 08/21/2010   Method used:   Electronically to        [facility] (retail)       [address] [place] [address]  [address]       Ph: [phone]       Fax: [phone]   RxID:   [id number]    Medication Administration  Injection # 1:    Medication: Depo- Medrol 80mg     Diagnosis: ANKLE PAIN, RIGHT (ICD-719.47)    Route: IM    Site: RUOQ gluteus    Exp  Date: 03/2011    Lot #: [id number]    Mfr: Pharmacia    Comments: 80mg  given     Patient tolerated injection without complications    Given by: Kate Sable LPN (October  6, 624THL 9:45 AM)  Injection # 2:    Medication: Ketorolac-Toradol 15mg     Diagnosis: ANKLE PAIN, RIGHT (ICD-719.47)    Route: IM    Site: LUOQ gluteus    Exp Date: 01/2012    Lot #: [id number]     Mfr: [email or web address]    Comments: 60mg  given     Patient tolerated injection without complications    Given by: Kate Sable LPN (October  6, 624THL 9:46 AM)  Orders Added: 1)  T-CBC w/Diff [id number] 2)  T-Uric Acid (Blood) [id number] 3)  Depo- Medrol 80mg  [J1040] 4)  Ketorolac-Toradol 15mg  [J1885] 5)  Admin of Therapeutic Inj  intramuscular or subcutaneous [96372] 6)  Est. Patient Level III [id number]

## 2010-12-18 NOTE — Progress Notes (Signed)
Phone Note Call from Patient   Caller: Patient Summary of Call: patient wants cymbalta changed to generic med Initial call taken by: Baldomero Lamy LPN,  February 21, 624THL 2:48 PM  Follow-up for Phone Call        no generic cymbalta, if it is for depression only and not pain, she can start fluoxetine 10mg  one daily pls erx 30 refill 3 if she agrees, this also helps withpain so encourage her to try it. She needs to taper off the cymbalta she has nopt stop abruptly, one mon, wed fri sund, then tues thursd and saturday then stiop, she can start the fluoxetine while still on cymbalta, let her know Follow-up by: Tula Nakayama MD,  January 06, 2010 5:25 PM  Additional Follow-up for Phone Call Additional follow up Details #1::        med change made, called patient, left message Additional Follow-up by: Baldomero Lamy LPN,  February 22, 624THL 11:30 AM    Additional Follow-up for Phone Call Additional follow up Details #2::    called patient, left message Follow-up by: Baldomero Lamy LPN,  February 23, 624THL 4:03 PM  Additional Follow-up for Phone Call Additional follow up Details #3:: Details for Additional Follow-up Action Taken: patient aware Additional Follow-up by: Baldomero Lamy LPN,  February 24, 624THL 10:54 AM  New/Updated Medications: FLUOXETINE HCL 10 MG CAPS (FLUOXETINE HCL) one cap by mouth once daily   discontinue cymbalta Prescriptions: NIASPAN 500 MG CR-TABS (NIACIN (ANTIHYPERLIPIDEMIC)) two tabs by mouth at bedtime  #60 x 4   Entered by:   Baldomero Lamy LPN   Authorized by:   Tula Nakayama MD   Signed by:   Baldomero Lamy LPN on [id number]   Method used:   Printed then [facility]ed to ...       [facility]       [address] [place]  [address]       Ph: [phone]       Fax: [phone]   RxID:   [id number] ZOLPIDEM TARTRATE 10 MG TABS (ZOLPIDEM TARTRATE) ONE TAB by mouth QHS  #30 x 4   Entered by:   Baldomero Lamy LPN   Authorized by:   Tula Nakayama MD   Signed by:   Baldomero Lamy LPN on [id number]   Method used:   Printed then faxed to ...       [facility] [address]       [address] [place] [address]  [address]       Ph: [phone]       Fax: [phone]   RxID:   [id number] FLUOXETINE HCL 10 MG CAPS (FLUOXETINE HCL) one cap by mouth once daily   discontinue cymbalta  #30 x 2   Entered by:   Baldomero Lamy LPN   Authorized by:   Tula Nakayama MD   Signed by:   Baldomero Lamy LPN on [id number]   Method used:   Printed then faxed to ...       [facility] [address]       [address] [place]  [address]       Ph: [phone]       Fax: [phone]   RxID:   [id number] FLUOXETINE HCL 10 MG CAPS (FLUOXETINE HCL) one cap by mouth once daily   discontinue  cymbalta  #30 x 2   Entered by:   Baldomero Lamy LPN   Authorized by:   Tula Nakayama MD   Signed by:   Baldomero Lamy LPN on 624THL   Method used:   Electronically to        [facility]. [phone]* (retail)       [address]       [place], [place]  [address]       Ph: [phone] or [phone]       Fax: [phone]   RxID:   [id number]

## 2010-12-18 NOTE — Progress Notes (Signed)
Summary: speak with nurse  Phone Note Call from Patient   Summary of Call: needs to speak with nurse about getting heart burn meds. [phone]  [id number] Initial call taken by: Lenn Cal,  March 05, 2010 10:25 AM  Follow-up for Phone Call        returned call, no answer  called patient, not available Follow-up by: Baldomero Lamy LPN,  April 20, 624THL 10:48 AM  Additional Follow-up for Phone Call Additional follow up Details #1::        called patient, left message Additional Follow-up by: Baldomero Lamy LPN,  April 21, 624THL 10:41 AM

## 2010-12-18 NOTE — Progress Notes (Signed)
Summary: change med   Phone Note Call from Patient   Summary of Call: needs another acid reflux med called into pharm. the one she is taking insurance want cover it. [phone] [facility] [place] Initial call taken by: Lenn Cal,  February 17, 2010 3:05 PM  Follow-up for Phone Call        advise anderx omeprazole 40mg  Take 1 capsule by mouth at bedtime #30 refill4 Follow-up by: Tula Nakayama MD,  February 18, 2010 12:22 PM  Additional Follow-up for Phone Call Additional follow up Details #1::        this was already changed Additional Follow-up by: Baldomero Lamy LPN,  April  6, 624THL 12:58 PM

## 2010-12-18 NOTE — Progress Notes (Signed)
  Phone Note Call from Patient   Caller: Patient Summary of Call: patient states she spoke with insurance co. and states they will pay for her cymbalta, requested that i send in, states you had put her on buproprion but she hasnt taken it, sent in cymbalta for patient but advised not to start out on twice a day dose that she was on previously since it had been a while since taking the med, also advised to discuss this with you at her appt on friday Initial call taken by: Baldomero Lamy LPN,  April 26, 624THL 10:04 AM  Follow-up for Phone Call        noted and agree Follow-up by: Tula Nakayama MD,  March 11, 2010 11:29 AM

## 2010-12-18 NOTE — Progress Notes (Signed)
Summary: neds paper faxed with appoinments  Phone Note Call from Patient   Summary of Call: she is trying to get food stamps and they need a list of all her visits since dec 2010 and please fax to [facility] z. at [phone] Initial call taken by: Dierdre Harness,  November 03, 2010 9:36 AM  Follow-up for Phone Call        faxed as requested Follow-up by: Baldomero Lamy LPN,  December 19, 624THL 2:12 PM

## 2010-12-18 NOTE — Progress Notes (Signed)
  Phone Note Call from Patient   Caller: Patient Summary of Call: insurance will not pay for omeprazole for this patient, may i send in ranitidine two times a day for this patient? if insurance doesnt cover this is is still only $4 at [facility] Initial call taken by: Baldomero Lamy LPN,  September  7, 624THL 2:34 PM  Follow-up for Phone Call        yes ok to send150mg  Take 1 tablet by mouth two times a day #60 refill x3 Follow-up by: Tula Nakayama MD,  July 23, 2010 5:04 PM  Additional Follow-up for Phone Call Additional follow up Details #1::        sent in and d/c'd omeprazole Additional Follow-up by: Kate Sable LPN,  September  8, 624THL 1:39 PM    New/Updated Medications: RANITIDINE HCL 150 MG TABS (RANITIDINE HCL) Take 1 tablet by mouth two times a day Prescriptions: RANITIDINE HCL 150 MG TABS (RANITIDINE HCL) Take 1 tablet by mouth two times a day  #60 x 3   Entered by:   Kate Sable LPN   Authorized by:   Tula Nakayama MD   Signed by:   Kate Sable LPN on [id number]   Method used:   Electronically to        [facility] (retail)       [address] [place]  [address]       Ph: [phone]       Fax: [phone]   [place] [facility]:   [phone]

## 2010-12-18 NOTE — Letter (Signed)
Summary: MISC  MISC   Imported By: Dierdre Harness 05/22/2010 15:23:16  _____________________________________________________________________  External Attachment:    Type:   Image     Comment:   External Document

## 2010-12-18 NOTE — Miscellaneous (Signed)
Summary: handicapp card  handicapp card   Imported By: Dierdre Harness 09/08/2010 14:13:35  _____________________________________________________________________  External Attachment:    Type:   Image     Comment:   External Document

## 2010-12-18 NOTE — Letter (Signed)
Summary: X RAYS  X RAYS   Imported By: Dierdre Harness 05/22/2010 14:31:57  _____________________________________________________________________  External Attachment:    Type:   Image     Comment:   External Document

## 2010-12-18 NOTE — Progress Notes (Signed)
  Phone Note From Pharmacy   [facility]  [address] [phone]* Summary of Call: omeprazole requires pa Initial call taken by: Baldomero Lamy LPN,  April  6, 624THL 2:38 PM

## 2010-12-18 NOTE — Miscellaneous (Signed)
Summary: refill  Clinical Lists Changes  Medications: Rx of HYDROCODONE-ACETAMINOPHEN 10-500 MG  TABS (HYDROCODONE-ACETAMINOPHEN) Take 1 tablet by mouth four times a day;  #120 x 3;  Signed;  Entered by: Jimmey Ralph LPN;  Authorized by: Tula Nakayama MD;  Method used: Printed then faxed to [facility]. [phone]*, [address] [place], [place], [place]  [address], Ph: [phone] or [phone], Fax: [phone] Rx of CLONAZEPAM 1 MG  TABS (CLONAZEPAM) Take one tablet by mouth three times a day;  #90 x 3;  Signed;  Entered by: Jimmey Ralph LPN;  Authorized by: Tula Nakayama MD;  Method used: Printed then faxed to [facility]. [phone]*, [address], [place], [place], [place]  [address], Ph: [phone] or [phone], Fax: [phone]    Prescriptions: CLONAZEPAM 1 MG  TABS (CLONAZEPAM) Take one tablet by mouth three times a day  #90 x 3   Entered by:   Jimmey Ralph LPN   Authorized by:   Tula Nakayama MD   Signed by:   Jimmey Ralph LPN on [id number]   Method used:   Printed then faxed to ...       [facility]  [address] [phone][facility] (retail)       [address]       [place], [place]  [address]       Ph: [phone] or [phone]       Fax: [phone]   RxID:   [id number] HYDROCODONE-ACETAMINOPHEN 10-500 MG  TABS (HYDROCODONE-ACETAMINOPHEN) Take 1 tablet by mouth four times a day  #120 x 3   Entered by:   Jimmey Ralph LPN   Authorized by:   Tula Nakayama MD   Signed by:   Jimmey Ralph LPN on [id number]   Method used:   Printed then faxed to ...       [facility]  [address] [phone]* (retail)       [address]       [place], [place]  [address]       Ph: [phone] or [phone]       Fax: [phone]   RxID:   [id number]

## 2010-12-18 NOTE — Letter (Signed)
Summary: HISTORY & PHYSICAL  HISTORY & PHYSICAL   Imported By: Dierdre Harness 05/22/2010 14:06:45  _____________________________________________________________________  External Attachment:    Type:   Image     Comment:   External Document

## 2010-12-18 NOTE — Progress Notes (Signed)
Summary: refill   Phone Note Call from Patient   Summary of Call: needs to get some stripes and needles for blood machine [phone] [facility] Initial call taken by: Lenn Cal,  Apr 01, 2010 3:20 PM  Follow-up for Phone Call        Phone Call Completed, Rx Called In Follow-up by: Baldomero Lamy LPN,  May 17, 624THL 624THL PM    New/Updated Medications: ACCU-CHEK AVIVA  STRP (GLUCOSE BLOOD) use as directed for once daily testing ACCU-CHEK MULTICLIX LANCETS  MISC (LANCETS) use as directed for once daily testing Prescriptions: ACCU-CHEK MULTICLIX LANCETS  MISC (LANCETS) use as directed for once daily testing  #100 x 2   Entered by:   Baldomero Lamy LPN   Authorized by:   Tula Nakayama MD   Signed by:   Baldomero Lamy LPN on [id number]   Method used:   Electronically to        [facility] (retail)       [address] [place]  [address]       Ph: [phone]       Fax: [phone]   RxID:   [id number] ACCU-CHEK AVIVA  STRP (GLUCOSE BLOOD) use as directed for once daily testing  #100 x 2   Entered by:   Baldomero Lamy LPN   Authorized by:   Tula Nakayama MD   Signed by:   Baldomero Lamy LPN on [id number]-GRM   Method used:   Electronically to        [facility] (retail)       [address] [place]  [address]       Ph: [phone]       Fax: [phone]   RxID:   [id number]

## 2010-12-18 NOTE — Letter (Signed)
Summary: PHONE NOTES  PHONE NOTES   Imported By: Dierdre Harness 05/22/2010 14:31:03  _____________________________________________________________________  External Attachment:    Type:   Image     Comment:   External Document

## 2010-12-18 NOTE — Assessment & Plan Note (Signed)
Summary: FOLLOW UP 3.5 [facility]THS/SLJ   Vital Signs:  Patient profile:   65 year old female Menstrual status:  hysterectomy Height:      63 inches Weight:      199.50 pounds BMI:     35.47 O2 Sat:      97 % on Room air Pulse rate:   78 / minute Pulse rhythm:   regular Resp:     16 per minute BP sitting:   132 / 80  (right arm)  Vitals Entered By: Louie Casa CMA (September 26, 2010 9:23 AM)  Nutrition Counseling: Patient's BMI is greater than 25 and therefore counseled on weight management options.  O2 Flow:  Room air CC: Back pain   CC:  Back pain.  History of Present Illness: Reports  thatshe has ben doing well. Denies recent fever or chills. Denies sinus pressure, nasal congestion , ear pain or sore throat. Denies chest congestion, or cough productive of sputum. Denies chest pain, palpitations, PND, orthopnea or leg swelling. Denies abdominal pain, nausea, vomitting, diarrhea or constipation. Denies change in bowel movements or bloody stool. Denies dysuria , frequency, incontinence or hesitancy.  Denies headaches, vertigo, seizures. Denies depression, anxiety or insomnia.    Current Medications (verified): 1)  Levoxyl 50 Mcg  Tabs (Levothyroxine Sodium) .... Take One Tablet By Mouth Once A Day 2)  Bayer Aspirin Ec Low Dose 81 Mg  Tbec (Aspirin) .... Take One Tablet By Mouth Once A Day 3)  Calcium 500/d 500-200 Mg-Unit  Tabs (Calcium Carbonate-Vitamin D) .... Take One Tblaet By Mouth Three Times A Day 4)  Amitriptyline Hcl 25 Mg  Tabs (Amitriptyline Hcl) .... Take One Tablet By Mouth At Bedtime 5)  Clonazepam 1 Mg  Tabs (Clonazepam) .... Take One Tablet By Mouth Three Times A Day 6)  Soma 350 Mg  Tabs (Carisoprodol) .... Take 1 Tab By Mouth At Bedtime 7)  Hydrocodone-Acetaminophen 10-500 Mg  Tabs (Hydrocodone-Acetaminophen) .... Take 1 Tablet By Mouth Four Times A Day 8)  Lotensin 20 Mg Tabs (Benazepril Hcl) .... One Tab By Mouth Qd 9)  Metformin Hcl 500 Mg Tabs  (Metformin Hcl) .... Take 1 Tablet By Mouth Two Times A Day 10)  Multivitamins  Tabs (Multiple Vitamin) .... Take 1 Tablet By Mouth Once A Day 11)  Cymbalta 60 Mg Cpep (Duloxetine Hcl) .... One Cap By Mouth Two Times A Day 12)  Trilipix 135 Mg Cpdr (Choline Fenofibrate) .... Take 1 Tablet By Mouth Once A Day 13)  Accu-Chek Aviva  Strp (Glucose Blood) .... Use As Directed For Once Daily Testing 14)  Accu-Chek Multiclix Lancets  Misc (Lancets) .... Use As Directed For Once Daily Testing 15)  Fish Oil 1000 Mg Caps (Omega-3 Fatty Acids) .... Take 1 Tablet By Mouth Three Times A Day 16)  Simvastatin 40 Mg Tabs (Simvastatin) .... Take 1 Tab By Mouth At Bedtime 17)  Indomethacin 50 Mg Caps (Indomethacin) .... One Capsule 3 Times Daily For 2 Days , Then Twice Daily For 2 Days , Then 1 Daily For 3 Days 18)  Stool Softner .... As Needed  Allergies (verified): 1)  ! Demerol 2)  ! Darvocet 3)  ! * Niaspan  Review of Systems      See HPI Eyes:  Denies discharge, eye pain, and red eye. MS:  Complains of joint pain, low back pain, mid back pain, and stiffness. Derm:  Complains of itching, lesion(s), and rash; c/o pritic rash with peeling of the skin under the abdomen,  uses cornstartch. Psych:  Complains of anxiety and depression; denies mental problems, suicidal thoughts/plans, thoughts of violence, and unusual visions or sounds. Endo:  Denies cold intolerance, excessive hunger, excessive thirst, excessive urination, and heat intolerance. Heme:  Complains of abnormal bruising; denies bleeding. Allergy:  Complains of seasonal allergies.  Physical Exam  General:  Well-developedobese,in no acute distress; alert,appropriate and cooperative throughout examination HEENT: No facial asymmetry,  EOMI, No sinus tenderness, TM's Clear, oropharynx  pink and moist.   Chest: Clear to auscultation bilaterally.  CVS: S1, S2, No murmurs, No S3.   Abd: Soft, Nontender.  MS: decreased  ROM spine,adequate in  hips,  shoulders and knees.  Ext: No edema.   CNS: CN 2-12 intact, power tone and sensation normal throughout.   Skin: Intact, mild erythema os skin on lower abdomen, where there is an excessive amt of ioverhanging fat, nmo active fungal infection at this time Psych: Good eye contact, normal affect.  Memory intact, not anxious or depressed appearing.    Impression & Recommendations:  Problem # 1:  OBESITY, UNSPECIFIED (ICD-278.00) Assessment Improved  Ht: 63 (09/26/2010)   Wt: 199.50 (09/26/2010)   BMI: 35.47 (09/26/2010) therapeutic lifestyle change discussed and encouraged. over 100 pound weight loss achived, since lap band in 2009  Problem # 2:  HYPERLIPIDEMIA (ICD-272.4) Assessment: Comment Only  The following medications were removed from the medication list:    Simvastatin 40 Mg Tabs (Simvastatin) .Marland Kitchen... Take 1 tab by mouth at bedtime Her updated medication list for this problem includes:    Trilipix 135 Mg Cpdr (Choline fenofibrate) .Marland Kitchen... Take 1 tablet by mouth once a day    Simvastatin 20 Mg Tabs (Simvastatin) .Marland Kitchen... Take 1 tab by mouth at bedtime  Orders: Medicare Electronic Prescription [phone]) T-Lipid Profile [phone]) Low fat dietdiscussed and encouraged  Labs Reviewed: SGOT: 15 (08/21/2010)   SGPT: <8 U/L (08/21/2010)   HDL:41 (08/21/2010), 45 (05/13/2010)  LDL:68 (08/21/2010), 105 (05/13/2010)  Chol:142 (08/21/2010), 182 (05/13/2010)  Trig:165 (08/21/2010), 162 (05/13/2010)  Problem # 3:  DIABETES MELLITUS, TYPE II, WITHOUT COMPLICATIONS ([id number]) Assessment: Improved  The following medications were removed from the medication list:    Metformin Hcl 500 Mg Tabs (Metformin hcl) .Marland Kitchen... Take 1 tablet by mouth two times a day Her updated medication list for this problem includes:    Bayer Aspirin Ec Low Dose 81 Mg Tbec (Aspirin) .Marland Kitchen... Take one tablet by mouth once a day    Lotensin 20 Mg Tabs (Benazepril hcl) ..... One tab by mouth qd pt to d/c meds, use dietary  management only at this time Orders: T- Hemoglobin A1C [id number]) T-Urine Microalbumin w/creat. ratio [phone])  Labs Reviewed: Creat: 1.32 (08/21/2010)    Reviewed HgBA1c results: 5.9 (08/21/2010)  6.3 (05/13/2010)  Problem # 4:  ESSENTIAL HYPERTENSION, BENIGN (ICD-401.1) Assessment: Unchanged  Her updated medication list for this problem includes:    Lotensin 20 Mg Tabs (Benazepril hcl) ..... One tab by mouth qd  Orders: T-Basic Metabolic Panel ([id number])  BP today: 132/80 Prior BP: 110/82 (08/21/2010)  Labs Reviewed: K+: 4.6 (08/21/2010) Creat: : 1.32 (08/21/2010)   Chol: 142 (08/21/2010)   HDL: 41 (08/21/2010)   LDL: 68 (08/21/2010)   TG: 165 (08/21/2010)  Problem # 5:  HYPOTHYROIDISM (ICD-244.9) Assessment: Unchanged  Her updated medication list for this problem includes:    Levoxyl 50 Mcg Tabs (Levothyroxine sodium) .Marland Kitchen... Take one tablet by mouth once a day  Labs Reviewed: TSH: 3.208 (08/21/2010)    HgBA1c: 5.9 (08/21/2010) Chol: 142 (08/21/2010)  HDL: 41 (08/21/2010)   LDL: 68 (08/21/2010)   TG: 165 (08/21/2010)  Problem # 6:  ANXIETY STATE, UNSPECIFIED (ICD-300.00) Assessment: Improved  Her updated medication list for this problem includes:    Amitriptyline Hcl 25 Mg Tabs (Amitriptyline hcl) .Marland Kitchen... Take one tablet by mouth at bedtime    Clonazepam 1 Mg Tabs (Clonazepam) .Marland Kitchen... Take one tablet by mouth three times a day    Cymbalta 60 Mg Cpep (Duloxetine hcl) ..... One cap by mouth two times a day  Problem # 7:  DEPRESSION (ICD-311) Assessment: Improved  Her updated medication list for this problem includes:    Amitriptyline Hcl 25 Mg Tabs (Amitriptyline hcl) .Marland Kitchen... Take one tablet by mouth at bedtime    Clonazepam 1 Mg Tabs (Clonazepam) .Marland Kitchen... Take one tablet by mouth three times a day    Cymbalta 60 Mg Cpep (Duloxetine hcl) ..... One cap by mouth two times a day  Problem # 8:  BACK PAIN, CHRONIC (ICD-724.5) Assessment: Deteriorated  Her  updated medication list for this problem includes:    Bayer Aspirin Ec Low Dose 81 Mg Tbec (Aspirin) .Marland Kitchen... Take one tablet by mouth once a day    Soma 350 Mg Tabs (Carisoprodol) .Marland Kitchen... Take 1 tab by mouth at bedtime    Hydrocodone-acetaminophen 10-500 Mg Tabs (Hydrocodone-acetaminophen) .Marland Kitchen... Take 1 tablet by mouth four times a day    Indomethacin 50 Mg Caps (Indomethacin) ..... One capsule 3 times daily for 2 days , then twice daily for 2 days , then 1 daily for 3 days  Orders: Ketorolac-Toradol 15mg  [id number]) Admin of Therapeutic Inj  intramuscular or subcutaneous [id number])  Problem # 9:  CANDIDIASIS (ICD-112.9) Assessment: Comment Only nystop prescribed fo as needed use  Complete Medication List: 1)  Levoxyl 50 Mcg Tabs (Levothyroxine sodium) .... Take one tablet by mouth once a day 2)  Bayer Aspirin Ec Low Dose 81 Mg Tbec (Aspirin) .... Take one tablet by mouth once a day 3)  Calcium 500/d 500-200 Mg-unit Tabs (Calcium carbonate-vitamin d) .... Take one tblaet by mouth three times a day 4)  Amitriptyline Hcl 25 Mg Tabs (Amitriptyline hcl) .... Take one tablet by mouth at bedtime 5)  Clonazepam 1 Mg Tabs (Clonazepam) .... Take one tablet by mouth three times a day 6)  Soma 350 Mg Tabs (Carisoprodol) .... Take 1 tab by mouth at bedtime 7)  Hydrocodone-acetaminophen 10-500 Mg Tabs (Hydrocodone-acetaminophen) .... Take 1 tablet by mouth four times a day 8)  Lotensin 20 Mg Tabs (Benazepril hcl) .... One tab by mouth qd 9)  Multivitamins Tabs (Multiple vitamin) .... Take 1 tablet by mouth once a day 10)  Cymbalta 60 Mg Cpep (Duloxetine hcl) .... One cap by mouth two times a day 11)  Trilipix 135 Mg Cpdr (Choline fenofibrate) .... Take 1 tablet by mouth once a day 12)  Accu-chek Aviva Strp (Glucose blood) .... Use as directed for once daily testing 13)  Accu-chek Multiclix Lancets Misc (Lancets) .... Use as directed for once daily testing 14)  Fish Oil 1000 Mg Caps (Omega-3 fatty acids) .... Take  1 tablet by mouth three times a day 15)  Indomethacin 50 Mg Caps (Indomethacin) .... One capsule 3 times daily for 2 days , then twice daily for 2 days , then 1 daily for 3 days 16)  Stool Softner  .... As needed 17)  Simvastatin 20 Mg Tabs (Simvastatin) .... Take 1 tab by mouth at bedtime 18)  Nystop 100000 Unit/gm Powd (Nystatin) .... Apply ywice  daily as needed for groin rash  Other Orders: Radiology Referral (Radiology) T-TSH [phone]) T-CBC w/Diff [phone]) Influenza Vaccine MCR [phone])  Patient Instructions: 1)  Please schedule a follow-up appointment in 4 months. 2)  It is important that you exercise regularly at least 20 minutes 5 times a week. If you develop chest pain, have severe difficulty breathing, or feel very tired , stop exercising immediately and seek medical attention. 3)  You need to lose weight. Consider a lower calorie diet and regular exercise.  4)  BMP prior to visit, ICD-9: 5)  Hepatic Panel prior to visit, ICD-9: 6)  Lipid Panel prior to visit, ICD-9:  fasting in 4 months 7)  TSH prior to visit, ICD-9: 8)  HbgA1C prior to visit, ICD-9: 9)  CBC w/ Diff prior to visit, ICD-9: 10)  PLS stop metformin. 11)  Dose reduction of simvastatin as discussed, pls take half 40mg  tab at bedtime as discussed. 12)  Pls test blood sugars at least 3 days per week Prescriptions: TRILIPIX 135 MG CPDR (CHOLINE FENOFIBRATE) Take 1 tablet by mouth once a day  #30 Capsule x 3   Entered by:   Baldomero Lamy LPN   Authorized by:   Tula Nakayama MD   Signed by:   Baldomero Lamy LPN on [id number]   Method used:   Electronically to        [facility] (retail)       [address] [place]  [address]       Ph: [phone]       Fax: [phone]   RxID:   [id number] AMITRIPTYLINE HCL 25 MG  TABS (AMITRIPTYLINE HCL) Take one tablet by mouth at bedtime  #30 Tablet x 3   Entered by:   Baldomero Lamy LPN   Authorized by:   Tula Nakayama MD   Signed by:   Baldomero Lamy LPN on [id number]   Method used:   Electronically to        [facility] (retail)       [address] [place]  [address]       Ph: [phone]       Fax: [phone]   RxID:   [id number] LEVOXYL 50 MCG  TABS (LEVOTHYROXINE SODIUM) Take one tablet by mouth once a day  #30 Tablet x 3   Entered by:   Baldomero Lamy LPN   Authorized by:   Tula Nakayama MD   Signed by:   Baldomero Lamy LPN on [id number]   Method used:   Electronically to        [facility] (retail)       [address] [place]  [address]       Ph: [phone]       Fax: [phone]   RxID:   [id number] [place] 100000 UNIT/GM POWD ([place]) apply ywice daily as needed for groin rash  #45 gm x 2   Entered and Authorized by:   Tula Nakayama MD   Signed by:   Tula Nakayama MD on 09/26/2010   Method used:   Electronically to        [facility] (retail)       [address] [place] [address]  [address]       Ph: [phone]  Fax: [phone]   [id number] SIMVASTATIN 20 MG TABS (SIMVASTATIN) Take 1 tab by mouth at bedtime  #30 x 3   Entered and Authorized by:   Tula Nakayama MD   Signed by:   Tula Nakayama MD on 09/26/2010   Method used:   Printed then faxed to ...       [facility] (retail)       [address] [place] [address] [place]  [address]       Ph: [phone]       Fax: [phone]   RxID:   [id number]    Medication Administration  Injection # 1:    Medication: Ketorolac-Toradol 15mg     Diagnosis: BACK PAIN, CHRONIC (ICD-724.5)    Route: IM    Site: LUOQ gluteus    Exp Date: 09/17/2011    Lot #: [id number]    Mfr: [email or web address]    Comments: toradol 60mg  given    Patient tolerated injection without complications    Given by: Baldomero Lamy LPN (November 11, 624THL 10:15 AM)  Orders Added: 1)  Est. Patient Level IV [id number] 2)  Medicare Electronic Prescription [id number] 3)  Radiology Referral [Radiology] 4)  T- Hemoglobin A1C  [[id number]] 5)  T-Basic Metabolic Panel 0000000 6)  T-Lipid Profile [80061-22930] 7)  T-TSH [id number] 8)  T-Urine Microalbumin w/creat. ratio [82043-[address]] 9)  T-CBC w/Diff AT:5710219 10)  Influenza Vaccine MCR [00025] 11)  Ketorolac-Toradol 15mg  [J1885] 12)  Admin of Therapeutic Inj  intramuscular or subcutaneous [[id number]]   Immunizations Administered:  Influenza Vaccine # 1:    Vaccine Type: Fluvax MCR    Site: left deltoid    Mfr: novartis    Dose: 0.5 ml    Route: IM    Given by: Baldomero Lamy LPN    Exp. Date: 03/2011    Lot #: [id number]    VIS given: 06/10/10 version given September 26, 2010.   Immunizations Administered:  Influenza Vaccine # 1:    Vaccine Type: Fluvax MCR    Site: left deltoid    Mfr: novartis    Dose: 0.5 ml    Route: IM    Given by: Baldomero Lamy LPN    Exp. Date: 03/2011    Lot #: [id number] 5P    VIS given: 06/10/10 version given September 26, 2010.     Medication Administration  Injection # 1:    Medication: Ketorolac-Toradol 15mg     Diagnosis: BACK PAIN, CHRONIC (ICD-724.5)    Route: IM    Site: LUOQ gluteus    Exp Date: 09/17/2011    Lot #: [id number]    Mfr: [email or web address]    Comments: toradol 60mg  given    Patient tolerated injection without complications    Given by: Baldomero Lamy LPN (November 11, 624THL 10:15 AM)  Orders Added: 1)  Est. Patient Level IV [id number] 2)  Medicare Electronic Prescription [id number] 3)  Radiology Referral [Radiology] 4)  T- Hemoglobin A1C [83036-23375] 5)  T-Basic Metabolic Panel 0000000 6)  T-Lipid Profile [80061-22930] 7)  T-TSH [id number] 8)  T-Urine Microalbumin w/creat. ratio [82043-[address]] 9)  T-CBC w/Diff AT:5710219 10)  Influenza Vaccine MCR [00025] 11)  Ketorolac-Toradol 15mg  [J1885] 12)  Admin of Therapeutic Inj  intramuscular or subcutaneous [id number]

## 2010-12-18 NOTE — Progress Notes (Signed)
Summary: ADULT ECHOCARDIOGRAPHY  ADULT ECHOCARDIOGRAPHY   Imported By: Dierdre Harness 02/25/2010 12:56:53  _____________________________________________________________________  External Attachment:    Type:   Image     Comment:   External Document

## 2010-12-18 NOTE — Letter (Signed)
Summary: OFFICE NOTES  OFFICE NOTES   Imported By: Dierdre Harness 05/22/2010 14:27:25  _____________________________________________________________________  External Attachment:    Type:   Image     Comment:   External Document

## 2011-01-02 ENCOUNTER — Telehealth: Payer: Self-pay | Admitting: Family Medicine

## 2011-01-05 ENCOUNTER — Telehealth: Payer: Self-pay | Admitting: Family Medicine

## 2011-01-08 ENCOUNTER — Encounter: Payer: Self-pay | Admitting: Family Medicine

## 2011-01-13 NOTE — Letter (Signed)
Summary: d/c medicine  d/c medicine   Imported By: Dierdre Harness 01/08/2011 09:10:43  _____________________________________________________________________  External Attachment:    Type:   Image     Comment:   External Document

## 2011-01-13 NOTE — Progress Notes (Signed)
  Phone Note Call from Patient   Caller: Patient Summary of Call: patient is requesting script for claritan for her allergies Initial call taken by: Baldomero Lamy LPN,  February 20, X33443 3:03 PM  Follow-up for Phone Call        please notify the patient and the pharmacy of the medication . The new script is entered historically, please send after speaking with the patient.  Follow-up by: Tula Nakayama MD,  January 05, 2011 4:57 PM    New/Updated Medications: * LORATIDINE 10MG  Take 1 tablet by mouth once a day Prescriptions: LORATIDINE 10MG  Take 1 tablet by mouth once a day  #90 x 1   Entered by:   Baldomero Lamy LPN   Authorized by:   Tula Nakayama MD   Signed by:   Baldomero Lamy LPN on [id number]   Method used:   Faxed to ...       [facility] (retail)       [address] [place] [address]  [address]       Ph: [phone]       Fax: [phone]   RxID:   [id number] LORATIDINE 10MG  Take 1 tablet by mouth once a day  #90 x 1   Entered and Authorized by:   Tula Nakayama MD   Signed by:   Tula Nakayama MD on 01/05/2011   Method used:   Historical   [id number]

## 2011-01-13 NOTE — Progress Notes (Signed)
  Phone Note Call from Patient   Summary of Call: Carisoprodol 350mg  not covered. Not totally sure what alternativeis covered but baclofen and Zanaflex usually is. Initial call taken by: Kate Sable LPN,  February 17, X33443 3:54 PM  Follow-up for Phone Call        pls advise pt , abaclofen is covered, I will enter historically Follow-up by: Tula Nakayama MD,  January 03, 2011 6:49 AM  Additional Follow-up for Phone Call Additional follow up Details #1::        called patient, left message with family member to call back  Additional Follow-up by: Kate Sable LPN,  February 20, [id number] 2:21 PM    Additional Follow-up for Phone Call Additional follow up Details #2::    patient aware Follow-up by: Baldomero Lamy LPN,  February 20, X33443 3:02 PM  New/Updated Medications: BACLOFEN 20 MG TABS (BACLOFEN) Take 1 tab by mouth at bedtime Prescriptions: BACLOFEN 20 MG TABS (BACLOFEN) Take 1 tab by mouth at bedtime  #30 x 3   Entered by:   Kate Sable LPN   Authorized by:   Tula Nakayama MD   Signed by:   Kate Sable LPN on [id number]   Method used:   Printed then faxed to ...       [facility]retail[facility]       [address] [place] [address] [place]  [address]       Ph: [phone]       Fax: [phone]   [id number] BACLOFEN 20 MG TABS (BACLOFEN) Take 1 tab by mouth at bedtime  #30 x 3   Entered and Authorized by:   Tula Nakayama MD   Signed by:   Tula Nakayama MD on 01/03/2011   Method used:   Historical   [id number]

## 2011-01-16 ENCOUNTER — Other Ambulatory Visit: Payer: Self-pay | Admitting: Family Medicine

## 2011-01-16 DIAGNOSIS — Z139 Encounter for screening, unspecified: Secondary | ICD-10-CM

## 2011-01-22 ENCOUNTER — Encounter: Payer: Self-pay | Admitting: Family Medicine

## 2011-01-30 ENCOUNTER — Ambulatory Visit (INDEPENDENT_AMBULATORY_CARE_PROVIDER_SITE_OTHER): Payer: Medicare Other | Admitting: Family Medicine

## 2011-01-30 ENCOUNTER — Ambulatory Visit (HOSPITAL_COMMUNITY)
Admission: RE | Admit: 2011-01-30 | Discharge: 2011-01-30 | Disposition: A | Discharge status: Home / Self Care | Payer: Medicare Other | Source: Ambulatory Visit | Attending: Family Medicine | Admitting: Family Medicine

## 2011-01-30 ENCOUNTER — Encounter: Payer: Self-pay | Admitting: Family Medicine

## 2011-01-30 DIAGNOSIS — E785 Hyperlipidemia, unspecified: Secondary | ICD-10-CM

## 2011-01-30 DIAGNOSIS — Z139 Encounter for screening, unspecified: Secondary | ICD-10-CM

## 2011-01-30 DIAGNOSIS — Z23 Encounter for immunization: Secondary | ICD-10-CM

## 2011-01-30 DIAGNOSIS — I10 Essential (primary) hypertension: Secondary | ICD-10-CM

## 2011-01-30 DIAGNOSIS — Z1231 Encounter for screening mammogram for malignant neoplasm of breast: Secondary | ICD-10-CM | POA: Insufficient documentation

## 2011-01-30 DIAGNOSIS — E669 Obesity, unspecified: Secondary | ICD-10-CM

## 2011-02-02 ENCOUNTER — Telehealth: Payer: Self-pay | Admitting: Family Medicine

## 2011-02-03 NOTE — Letter (Signed)
Summary: certification of disability  certification of disability   Imported By: Dierdre Harness 01/30/2011 09:37:07  _____________________________________________________________________  External Attachment:    Type:   Image     Comment:   External Document

## 2011-02-04 ENCOUNTER — Other Ambulatory Visit: Payer: Self-pay | Admitting: Family Medicine

## 2011-02-09 ENCOUNTER — Other Ambulatory Visit: Payer: Self-pay | Admitting: *Deleted

## 2011-02-09 MED ORDER — CLONAZEPAM 1 MG PO TABS: 1.0000 mg | ORAL_TABLET | Freq: Three times a day (TID) | ORAL | Status: DC

## 2011-02-10 ENCOUNTER — Other Ambulatory Visit: Payer: Self-pay | Admitting: Family Medicine

## 2011-02-10 MED ORDER — CLONAZEPAM 1 MG PO TABS: 1.0000 mg | ORAL_TABLET | Freq: Three times a day (TID) | ORAL | Status: DC

## 2011-02-11 ENCOUNTER — Other Ambulatory Visit: Payer: Self-pay | Admitting: Family Medicine

## 2011-02-11 NOTE — Telephone Encounter (Signed)
Patient only got 30 pills and she was suppose to get 2.

## 2011-02-12 NOTE — Assessment & Plan Note (Signed)
Summary: f up   Vital Signs:  Patient profile:   65 year old female Menstrual status:  hysterectomy Height:      63 inches Weight:      197 pounds BMI:     35.02 O2 Sat:      97 % on Room air Pulse rate:   70 / minute Pulse rhythm:   regular Resp:     16 per minute BP sitting:   160 / 94  (left arm)  Vitals Entered By: Baldomero Lamy LPN (March 16, [id number] [id number] AM)  Nutrition Counseling: Patient's BMI is greater than 25 and therefore counseled on weight management options.  O2 Flow:  Room air CC: follow-up visit Is Patient Diabetic? No   CC:  follow-up visit.  History of Present Illness: Reports  that tshe is doing fairly  well. Denies recent fever or chills. Denies sinus pressure, nasal congestion , ear pain or sore throat. Denies chest congestion, or cough productive of sputum. Denies chest pain, palpitations, PND, orthopnea or leg swelling. Denies abdominal pain, nausea, vomitting, diarrhea or constipation. Denies change in bowel movements or bloody stool. Denies dysuria , frequency, incontinence or hesitancy.  Denies headaches, vertigo, seizures. Denies depression, anxiety or insomnia. Denies  rash, lesions, or itch.     Current Medications (verified): 1)  Levoxyl 50 Mcg  Tabs (Levothyroxine Sodium) .... Take One Tablet By Mouth Once A Day 2)  Bayer Aspirin Ec Low Dose 81 Mg  Tbec (Aspirin) .... Take One Tablet By Mouth Once A Day 3)  Calcium 500/d 500-200 Mg-Unit  Tabs (Calcium Carbonate-Vitamin D) .... Take One Tblaet By Mouth Three Times A Day 4)  Amitriptyline Hcl 25 Mg  Tabs (Amitriptyline Hcl) .... Take One Tablet By Mouth At Bedtime 5)  Clonazepam 1 Mg  Tabs (Clonazepam) .... Take One Tablet By Mouth Three Times A Day 6)  Hydrocodone-Acetaminophen 10-500 Mg  Tabs (Hydrocodone-Acetaminophen) .... Take 1 Tablet By Mouth Four Times A Day 7)  Lotensin 20 Mg Tabs (Benazepril Hcl) .... One Tab By Mouth Qd 8)  Multivitamins  Tabs (Multiple Vitamin) .... Take 1 Tablet  By Mouth Once A Day 9)  Cymbalta 60 Mg Cpep (Duloxetine Hcl) .... One Cap By Mouth Two Times A Day 10)  Trilipix 135 Mg Cpdr (Choline Fenofibrate) .... Take 1 Tablet By Mouth Once A Day 11)  Accu-Chek Aviva  Strp (Glucose Blood) .... Use As Directed For Once Daily Testing 12)  Accu-Chek Multiclix Lancets  Misc (Lancets) .... Use As Directed For Once Daily Testing 13)  Fish Oil 1000 Mg Caps (Omega-3 Fatty Acids) .... Take 1 Tablet By Mouth Three Times A Day 14)  Stool Softner .... As Needed 15)  Simvastatin 20 Mg Tabs (Simvastatin) .... Take 1 Tab By Mouth At Bedtime 16)  Nystop 100000 Unit/gm Powd (Nystatin) .... Apply Ywice Daily As Needed For Groin Rash 17)  Baclofen 20 Mg Tabs (Baclofen) .... Take 1 Tab By Mouth At Bedtime 18)  Loratidine 10mg  .... Take 1 Tablet By Mouth Once A Day 19)  Ranitidine Hcl 150 Mg Tabs (Ranitidine Hcl) .... One Tab By Mouth Two Times A Day 20)  Naprosyn 500 Mg Tabs (Naproxen) .... One Tab By Mouth Two Times A Day  Allergies (verified): 1)  ! Demerol 2)  ! Darvocet 3)  ! * Niaspan  Review of Systems      See HPI General:  Complains of fatigue. Eyes:  Denies discharge and red eye. MS:  Complains of joint pain,  low back pain, mid back pain, and stiffness. Endo:  Denies cold intolerance, excessive hunger, excessive thirst, excessive urination, and heat intolerance. Heme:  Denies abnormal bruising and bleeding. Allergy:  Denies hives or rash and itching eyes.  Physical Exam  General:  Well-developedobese,in no acute distress; alert,appropriate and cooperative throughout examination HEENT: No facial asymmetry,  EOMI, No sinus tenderness, TM's Clear, oropharynx  pink and moist.   Chest: Clear to auscultation bilaterally.  CVS: S1, S2, No murmurs, No S3.   Abd: Soft, Nontender.  MS: decreased  ROM spine,adequate in  hips, shoulders and knees.  Ext: No edema.   CNS: CN 2-12 intact, power tone and sensation normal throughout.   Skin: Intact,  Psych:not  anxious or depressed appearing.   Diabetes Management Exam:    Foot Exam (with socks and/or shoes not present):       Sensory-Monofilament:          Left foot: diminished          Right foot: diminished       Inspection:          Left foot: normal          Right foot: normal       Nails:          Left foot: thickened          Right foot: thickened   Impression & Recommendations:  Problem # 1:  HYPOTHYROIDISM (ICD-244.9) Assessment Unchanged  Her updated medication list for this problem includes:    Levoxyl 50 Mcg Tabs (Levothyroxine sodium) .Marland Kitchen... Take one tablet by mouth once a day  Orders: T-TSH [phone])  Labs Reviewed: TSH: 1.585 (12/18/2010)    HgBA1c: 5.8 (12/18/2010) Chol: 173 (12/18/2010)   HDL: 41 (12/18/2010)   LDL: 99 (12/18/2010)   TG: 167 (12/18/2010)  Problem # 2:  ANXIETY STATE, UNSPECIFIED (ICD-300.00) Assessment: Improved  Her updated medication list for this problem includes:    Amitriptyline Hcl 25 Mg Tabs (Amitriptyline hcl) .Marland Kitchen... Take one tablet by mouth at bedtime    Clonazepam 1 Mg Tabs (Clonazepam) .Marland Kitchen... Take one tablet by mouth three times a day    Cymbalta 60 Mg Cpep (Duloxetine hcl) ..... One cap by mouth two times a day  Problem # 3:  DEPRESSION (ICD-311) Assessment: Improved  Her updated medication list for this problem includes:    Amitriptyline Hcl 25 Mg Tabs (Amitriptyline hcl) .Marland Kitchen... Take one tablet by mouth at bedtime    Clonazepam 1 Mg Tabs (Clonazepam) .Marland Kitchen... Take one tablet by mouth three times a day    Cymbalta 60 Mg Cpep (Duloxetine hcl) ..... One cap by mouth two times a day  Problem # 4:  BACK PAIN, CHRONIC (ICD-724.5) Assessment: Unchanged  The following medications were removed from the medication list:    Indomethacin 50 Mg Caps (Indomethacin) ..... One capsule 3 times daily for 2 days , then twice daily for 2 days , then 1 daily for 3 days Her updated medication list for this problem includes:    Bayer Aspirin Ec Low  Dose 81 Mg Tbec (Aspirin) .Marland Kitchen... Take one tablet by mouth once a day    Hydrocodone-acetaminophen 10-500 Mg Tabs (Hydrocodone-acetaminophen) .Marland Kitchen... Take 1 tablet by mouth four times a day    Baclofen 20 Mg Tabs (Baclofen) .Marland Kitchen... Take 1 tab by mouth at bedtime    Naprosyn 500 Mg Tabs (Naproxen) ..... One tab by mouth two times a day  Problem # 5:  HYPERLIPIDEMIA (ICD-272.4) Assessment: Deteriorated  Her updated medication list for this problem includes:    Trilipix 135 Mg Cpdr (Choline fenofibrate) .Marland Kitchen... Take 1 tablet by mouth once a day    Simvastatin 20 Mg Tabs (Simvastatin) .Marland Kitchen... Take 1 tab by mouth at bedtime Low fat dietdiscussed and encouraged  Orders: T-Hepatic Function [phone]) T-Lipid Profile [phone]) T-Hepatic Function [phone])  Labs Reviewed: SGOT: 15 (08/21/2010)   SGPT: <8 U/L (08/21/2010)   HDL:41 (12/18/2010), 41 (08/21/2010)  LDL:99 (12/18/2010), 68 (08/21/2010)  Chol:173 (12/18/2010), 142 (08/21/2010)  Trig:167 (12/18/2010), 165 (08/21/2010)  Problem # 6:  ESSENTIAL HYPERTENSION, BENIGN (ICD-401.1) Assessment: Deteriorated  The following medications were removed from the medication list:    Lotensin 20 Mg Tabs (Benazepril hcl) ..... One tab by mouth qd Her updated medication list for this problem includes:    Benazepril Hcl 40 Mg Tabs (Benazepril hcl) .Marland Kitchen... Take 1 tablet by mouth once a day dose increase effective 01/30/2011  Orders: Medicare Electronic Prescription ([id number]) T-Basic Metabolic Panel ([id number]) T-Basic Metabolic Panel (99991111)  BP today: 160/94 Prior BP: 132/80 (09/26/2010)  Labs Reviewed: K+: 4.4 (12/18/2010) Creat: : 1.20 (12/18/2010)   Chol: 173 (12/18/2010)   HDL: 41 (12/18/2010)   LDL: 99 (12/18/2010)   TG: 167 (12/18/2010)  Complete Medication List: 1)  Levoxyl 50 Mcg Tabs (Levothyroxine sodium) .... Take one tablet by mouth once a day 2)  Bayer Aspirin Ec Low Dose 81 Mg Tbec (Aspirin) .... Take one tablet by mouth  once a day 3)  Calcium 500/d 500-200 Mg-unit Tabs (Calcium carbonate-vitamin d) .... Take one tblaet by mouth three times a day 4)  Amitriptyline Hcl 25 Mg Tabs (Amitriptyline hcl) .... Take one tablet by mouth at bedtime 5)  Clonazepam 1 Mg Tabs (Clonazepam) .... Take one tablet by mouth three times a day 6)  Hydrocodone-acetaminophen 10-500 Mg Tabs (Hydrocodone-acetaminophen) .... Take 1 tablet by mouth four times a day 7)  Multivitamins Tabs (Multiple vitamin) .... Take 1 tablet by mouth once a day 8)  Cymbalta 60 Mg Cpep (Duloxetine hcl) .... One cap by mouth two times a day 9)  Trilipix 135 Mg Cpdr (Choline fenofibrate) .... Take 1 tablet by mouth once a day 10)  Accu-chek Aviva Strp (Glucose blood) .... Use as directed for once daily testing 11)  Accu-chek Multiclix Lancets Misc (Lancets) .... Use as directed for once daily testing 12)  Fish Oil 1000 Mg Caps (Omega-3 fatty acids) .... Take 1 tablet by mouth three times a day 13)  Stool Softner  .... As needed 14)  Simvastatin 20 Mg Tabs (Simvastatin) .... Take 1 tab by mouth at bedtime 15)  Nystop 100000 Unit/gm Powd (Nystatin) .... Apply ywice daily as needed for groin rash 16)  Baclofen 20 Mg Tabs (Baclofen) .... Take 1 tab by mouth at bedtime 17)  Loratidine 10mg   .... Take 1 tablet by mouth once a day 18)  Ranitidine Hcl 150 Mg Tabs (Ranitidine hcl) .... One tab by mouth two times a day 19)  Naprosyn 500 Mg Tabs (Naproxen) .... One tab by mouth two times a day 20)  Benazepril Hcl 40 Mg Tabs (Benazepril hcl) .... Take 1 tablet by mouth once a day dose increase effective 01/30/2011  Other Orders: T- Hemoglobin A1C [id number]) Pneumococcal Vaccine [id number]) Admin 1st Vaccine [id number])  Patient Instructions: 1)  nurse blood pressure check in 6 weeks. 2)  MD f/u in 4 months. 3)  chem7 and hepatic panel in 6 weeks, non fasting 4)  fasting chem 7 lipid, hepatic ,tSH hBA1c in  4 months 5)  pls sched an eye exam 6)  congrats on weight  loss and improved blood sugars. 7)  pls reduce cheese and fatty foods. 8)  your chol and triglycerides have increased. 9)  your bP is too high, invc the benazepril to 40mg  one daily, ok to take tWO 20mg  tabs once daily till done Prescriptions: HYDROCODONE-ACETAMINOPHEN 10-500 MG  TABS (HYDROCODONE-ACETAMINOPHEN) Take 1 tablet by mouth four times a day  #120 x 3   Entered by:   Baldomero Lamy LPN   Authorized by:   Tula Nakayama MD   Signed by:   Baldomero Lamy LPN on 075-GRM   Method used:   Printed then faxed to ...       [facility] (retail[facility]       [address] [place]  [address]       Ph: [phone]       Fax: [phone]   RxID:   [id number] SIMVASTATIN 20 MG TABS (SIMVASTATIN) Take 1 tab by mouth at bedtime  #30 x 3   Entered by:   Baldomero Lamy LPN   Authorized by:   Tula Nakayama MD   Signed by:   Baldomero Lamy LPN on 075-GRM   Method used:   Electronically to        [facility] (retail)       [address] [place]  [address]       Ph: [phone]       Fax: [phone]   RxID:   [id number] TRILIPIX 135 MG CPDR (CHOLINE FENOFIBRATE) Take 1 tablet by mouth once a day  #30 Capsule x 3   Entered by:   Baldomero Lamy LPN   Authorized by:   Tula Nakayama MD   Signed by:   Baldomero Lamy LPN on 075-GRM   Method used:   Electronically to        [facility] (retail)       [address] [place] [address]  [address]       Ph: [phone]       Fax: [phone]   RxID:   [id number] CYMBALTA 60 MG CPEP (DULOXETINE HCL) one cap by mouth two times a day  #60 Capsule x 3   Entered by:   Baldomero Lamy LPN   Authorized by:   Tula Nakayama MD   Signed by:   Baldomero Lamy LPN on 075-GRM   Method used:   Electronically to        [facility] (retail)       [address] [place]  [address]       Ph: [phone]       Fax: [phone]   RxID:   [id number] AMITRIPTYLINE HCL 25 MG  TABS (AMITRIPTYLINE HCL) Take  one tablet by mouth at bedtime  #30 Tablet x 3   Entered by:   Baldomero Lamy LPN   Authorized by:   Tula Nakayama MD   Signed by:   Baldomero Lamy LPN on 075-GRM   Method used:   Electronically to        [facility] (retail)       [address] [place]  [address]  Ph: [phone]       Fax: [phone]   [id number] LEVOXYL 50 MCG  TABS (LEVOTHYROXINE SODIUM) Take one tablet by mouth once a day  #30 Tablet x 3   Entered by:   Baldomero Lamy LPN   Authorized by:   Tula Nakayama MD   Signed by:   Baldomero Lamy LPN on 075-GRM   Method used:   Electronically to        [facility] (retail)       [address] [place] [address]  [address]       Ph: [phone]       Fax: [phone]   RxID:   [id number] BENAZEPRIL HCL 40 MG TABS (BENAZEPRIL HCL) Take 1 tablet by mouth once a day dose increase effective 01/30/2011  #30 x 4   Entered and Authorized by:   Tula Nakayama MD   Signed by:   Tula Nakayama MD on 01/30/2011   Method used:   Printed then faxed to ...       [facility] [address]       [address] [place] [address]  [address]       Ph: [phone]       Fax: [phone]   RxID:   [id number]    Orders Added: 1)  Est. Patient Level IV [id number] 2)  Medicare Electronic Prescription [id number])  T-Basic Metabolic Panel 0000000 4)  T-Hepatic Function [80076-22960] 5)  T-Basic Metabolic Panel 0000000 6)  T-Lipid Profile [80061-22930] 7)  T-Hepatic Function [80076-22960] 8)  T- Hemoglobin A1C [83036-23375] 9)  T-TSH [id number] 10)  Pneumococcal Vaccine [90732] 11)  Admin 1st Vaccine [90471]   Immunizations Administered:  Pneumonia Vaccine:    Vaccine Type: Pneumovax    Site: right deltoid    Mfr: Merck    Dose: 0.5 ml    Route: IM    Given by: Baldomero Lamy LPN    Exp. Date: 04/09/2012    Lot #: [id number]    VIS given: 10/21/09 version given January 30, 2011.   Immunizations  Administered:  Pneumonia Vaccine:    Vaccine Type: Pneumovax    Site: right deltoid    Mfr: Merck    Dose: 0.5 ml    Route: IM    Given by: Baldomero Lamy LPN    Exp. Date: 04/09/2012    Lot #: [id number]    VIS given: 10/21/09 version given January 30, 2011.

## 2011-02-12 NOTE — Progress Notes (Signed)
Summary: refill  Phone Note Call from Patient   Summary of Call[facility] [place] [facility] [address] [facility]'t recieved anything from office. [phone] Initial call taken by: Lenn Cal,  February 02, 2011 4:15 PM    Prescriptions: HYDROCODONE-ACETAMINOPHEN 10-500 MG  TABS (HYDROCODONE-ACETAMINOPHEN) Take 1 tablet by mouth four times a day  #120 x 3   Entered by:   Baldomero Lamy LPN   Authorized by:   Tula Nakayama MD   Signed by:   Baldomero Lamy LPN on 579FGE   Method used:   Printed then faxed to ...       [facility] [address]       [address] [place] [address]  [address]       Ph: [phone]       Fax: [phone]   RxID:   [id number] BENAZEPRIL HCL 40 MG TABS (BENAZEPRIL HCL) Take 1 tablet by mouth once a day dose increase effective 01/30/2011  #30 x 3   Entered by:   Baldomero Lamy LPN   Authorized by:   Tula Nakayama MD   Signed by:   Baldomero Lamy LPN on 579FGE   Method used:   Electronically to        [facility] (retail)       [address] [place] [address]  [address]       Ph: [phone]       Fax: [phone]   RxID:   [id number] SIMVASTATIN 20 MG TABS (SIMVASTATIN) Take 1 tab by mouth at bedtime  #30 x 3   Entered by:   Baldomero Lamy LPN   Authorized by:   Tula Nakayama MD   Signed by:   Baldomero Lamy LPN on 579FGE   Method used:   Electronically to        [facility] (retail)       [address] [place]  [address]       Ph: [phone]       Fax: [phone]   RxID:   [id number] TRILIPIX 135 MG CPDR (CHOLINE FENOFIBRATE) Take 1 tablet by mouth once a day  #30 Capsule x 3   Entered by:   Baldomero Lamy LPN   Authorized by:   Tula Nakayama MD   Signed by:   Baldomero Lamy LPN on 579FGE   Method used:   Electronically to        [facility] (retail)       [address] [place] [address]  [address]       Ph: [phone]       Fax: [phone]   RxID:   [id number] CYMBALTA 60 MG CPEP (DULOXETINE  HCL) one cap by mouth two times a day  #60 Capsule x 3   Entered by:   Baldomero Lamy LPN   Authorized by:   Tula Nakayama MD   Signed by:   Baldomero Lamy LPN on 579FGE   Method used:   Electronically to        [facility] (retail)       [address] [place]  [address]       Ph: [phone]       Fax: [phone]   RxID:  [id number] AMITRIPTYLINE HCL 25 MG  TABS (AMITRIPTYLINE HCL) Take one tablet by mouth at bedtime  #30 Tablet x 3   Entered by:   Baldomero Lamy LPN   Authorized by:   Tula Nakayama MD   Signed by:   Baldomero Lamy LPN on 579FGE   Method used:   Electronically to        [facility] (retail)       [address] [place]  [address]       Ph: [phone]       Fax: [phone]   RxID:   [id number] LEVOXYL 50 MCG  TABS (LEVOTHYROXINE SODIUM) Take one tablet by mouth once a day  #30 Tablet x 3   Entered by:   Baldomero Lamy LPN   Authorized by:   Tula Nakayama MD   Signed by:   Baldomero Lamy LPN on 579FGE   Method used:   Electronically to        [facility] (retail)       [address] [place]  [address]       Ph: [phone]       Fax: [phone]   RxID:   [id number]

## 2011-02-14 ENCOUNTER — Other Ambulatory Visit: Payer: Self-pay | Admitting: Family Medicine

## 2011-02-19 LAB — GLUCOSE, CAPILLARY: Glucose-Capillary: 119 mg/dL — ABNORMAL HIGH (ref 70–99)

## 2011-02-24 ENCOUNTER — Other Ambulatory Visit: Payer: Self-pay

## 2011-02-24 ENCOUNTER — Other Ambulatory Visit: Payer: Self-pay | Admitting: Family Medicine

## 2011-02-24 ENCOUNTER — Telehealth: Payer: Self-pay | Admitting: Family Medicine

## 2011-02-24 DIAGNOSIS — F419 Anxiety disorder, unspecified: Secondary | ICD-10-CM

## 2011-02-24 MED ORDER — CLONAZEPAM 1 MG PO TABS: 1.0000 mg | ORAL_TABLET | Freq: Three times a day (TID) | ORAL | Status: DC

## 2011-03-04 ENCOUNTER — Other Ambulatory Visit: Payer: Self-pay | Admitting: Family Medicine

## 2011-03-05 NOTE — Telephone Encounter (Deleted)
error 

## 2011-03-09 ENCOUNTER — Encounter: Payer: Self-pay | Admitting: Family Medicine

## 2011-03-09 NOTE — Telephone Encounter (Deleted)
error 

## 2011-03-13 ENCOUNTER — Ambulatory Visit (INDEPENDENT_AMBULATORY_CARE_PROVIDER_SITE_OTHER): Payer: Medicare Other | Admitting: Family Medicine

## 2011-03-13 ENCOUNTER — Other Ambulatory Visit: Payer: Self-pay | Admitting: Family Medicine

## 2011-03-13 VITALS — BP 114/72 | Wt 196.1 lb

## 2011-03-13 DIAGNOSIS — I10 Essential (primary) hypertension: Secondary | ICD-10-CM

## 2011-03-13 LAB — HEPATIC FUNCTION PANEL
ALT: <8 U/L (ref 0–35)
Alkaline Phosphatase: 32 U/L — ABNORMAL LOW (ref 39–117)
Bilirubin, Direct: 0.1 mg/dL (ref 0.0–0.3)
Indirect Bilirubin: 0.3 mg/dL (ref 0.0–0.9)
Total Protein: 6.6 g/dL (ref 6.0–8.3)

## 2011-03-13 LAB — BASIC METABOLIC PANEL
CO2: 25 mEq/L (ref 19–32)
Calcium: 9.7 mg/dL (ref 8.4–10.5)
Chloride: 100 mEq/L (ref 96–112)
Creat: 1.32 mg/dL — ABNORMAL HIGH (ref 0.40–1.20)
Sodium: 136 mEq/L (ref 135–145)

## 2011-03-13 NOTE — Progress Notes (Signed)
BP checked and was within normal limits. No med changes

## 2011-03-14 NOTE — Progress Notes (Signed)
  Subjective:    Patient ID: Tammy Suarez, female    DOB: 17-Jul-1946, 65 y.o.   MRN: [id number]  HPI    Review of Systems     Objective:   Physical Exam        Assessment & Plan:  Hypertension controlled, pt to continue current meds

## 2011-03-18 NOTE — Telephone Encounter (Signed)
error 

## 2011-03-25 ENCOUNTER — Other Ambulatory Visit: Payer: Self-pay | Admitting: Family Medicine

## 2011-03-26 ENCOUNTER — Telehealth: Payer: Self-pay | Admitting: Family Medicine

## 2011-03-26 ENCOUNTER — Other Ambulatory Visit: Payer: Self-pay

## 2011-03-26 MED ORDER — INDOMETHACIN 50 MG PO CAPS: 50.0000 mg | ORAL_CAPSULE | ORAL | Status: DC

## 2011-03-26 NOTE — Telephone Encounter (Signed)
Already sent.

## 2011-03-31 NOTE — Procedures (Signed)
Tammy Suarez, RACINE               ACCOUNT NO.:  [id number]   MEDICAL RECORD NO.:  [id number]          PATIENT TYPE:  OUT   LOCATION:  SLEEP LAB                     FACILITY:  [facility]   PHYSICIAN:  Kathee Delton, MD,FCCPDATE OF BIRTH:  09/30/46   DATE OF STUDY:                            NOCTURNAL POLYSOMNOGRAM   REFERRING PHYSICIAN:   REFERRING PHYSICIAN:  Dr. Tula Nakayama   DATE OF STUDY:  04/08/2007   INDICATION FOR STUDY:  Hypersomnia with sleep apnea.   EPWORTH SLEEPINESS SCORE:  5   SLEEP ARCHITECTURE:  The patient had total sleep time of 311 minutes  with adequate slow wave sleep for age but never achieved REM.  Sleep  onset latency was prolonged at 43 minutes and sleep efficiency was  decreased at 82%.   RESPIRATORY DATA:  The patient was found to have one hypopnea and  nine  obstructive apneas for an apnea/hypopnea index of two events per hour.  The events did not appear to be positional and there was loud snoring  noted throughout..  The patient never achieved REM and therefore her  degree of sleep apnea may be somewhat underestimated.   OXYGEN DATA:  There is O2 desaturation as low as 90% with the patient's  obstructive events.   CARDIAC DATA:  Rare PVCs were noted but no clinically significant  arrhythmia.   MOVEMENT-PARASOMNIA:  The patient was found have 70 leg jerks with 3.5  per hour resulting in arousal or awakening.   IMPRESSIONS-RECOMMENDATIONS:  1. Small numbers of obstructive events which do not meet the      apnea/hypopnea index criteria for the obstructive sleep apnea      syndrome.  2. Rare PVCs noted but no clinically significant arrhythmia.  3. Moderate numbers of leg jerks with significant sleep disruption.      Clinical correlation is suggested to see if she has a history      consistent with a primary movement disorder of sleep such as  the      restless leg syndrome.      Kathee Delton, MD,FCCP  Diplomate, [place] Board of  Sleep  Medicine  Electronically Signed     KMC/MEDQ  D:  04/14/2007 14:57:14  T:  04/14/2007 16:12:55  Job:  [id number]

## 2011-04-03 NOTE — Op Note (Signed)
Tammy Suarez, Tammy Suarez               ACCOUNT NO.:  [id number]   MEDICAL RECORD NO.:  [id number]          PATIENT TYPE:  AMB   LOCATION:  DAY                           FACILITY:  [facility]   PHYSICIAN:  Leane Para C. Tamala Julian, M.D.   DATE OF BIRTH:  February 06, 1946   DATE OF PROCEDURE:  DATE OF DISCHARGE:                                 OPERATIVE REPORT   This is a 65 year old female with a history of chronic anemia,  gastroesophageal reflux disease, and indigestion.  She denies nausea or  vomiting.  She was referred by Dr. Moshe Cipro for upper endoscopy.  The patient  had a barium enema and colonoscopy in April 2007.  The colonoscopy was  normal.  A barium enema to get a better view of the cecum revealed a very  redundant colon, but otherwise was normal, without any intraluminal filling  defects.   PAST HISTORY:  She has diabetes mellitus, hypertension, hypothyroidism,  depression, low back pain, osteoarthritis, and morbid obesity.   SURGERIES:  Total abdominal hysterectomy with oophorectomy, umbilical hernia  repair, bladder suspension/repair, cervical disc surgery, lumbar disc  surgery, cholecystectomy.   ALLERGIES:  DARVOCET and DEMEROL.   MEDICATIONS:  Aspirin 81 mg daily, Lortab one every 4 hours as needed for  pain, Levoxyl 50 mcg daily, Cymbalta 60 mg daily, Prevacid 15 mg daily, Ziac  10 mg daily, amitriptyline 25 mg daily, calcium plus vitamin D three times  daily, Actos 15/850 twice daily, Toprol-XL 50 mg daily, Vytorin 10/40 daily,  Tricor 145 mg daily, Lidoderm 5% two patches every 12 hours.   PHYSICAL EXAMINATION:  VITAL SIGNS:  Blood pressure 146/68, pulse 64,  respirations 20, weight 300 pounds.  HEENT:  Reveals no abnormality.  NECK:  Supple.  She has a positive cervical scar.  Good range of motion of  her neck.  No JVD, bruit, adenopathy, or thyromegaly.  CHEST:  Clear to auscultation.  No rales, rubs, rhonchi, or wheezes.  HEART:  Regular rate and rhythm, without murmur, gallop, or  rub.  ABDOMEN:  Soft, obese, nontender, very large abdominal panniculus, without  induration.  EXTREMITIES:  No cyanosis, clubbing, or edema.  NEUROLOGICAL:  No focal motor, sensory, or cerebellar deficit.   IMPRESSION:  1.  Gastroesophageal reflux disease with progressive indigestion.  2.  Chronic anemia.  3.  Diabetes mellitus.  4.  Hypertension.  5.  Hypothyroidism.   PLAN:  Upper endoscopy.      Vernon Prey. Tamala Julian, M.D.  Electronically Signed     LCS/MEDQ  D:  05/19/2006  T:  05/19/2006  Job:  [id number]

## 2011-04-03 NOTE — Consult Note (Signed)
NAME:  Tammy Suarez, Tammy Suarez                         ACCOUNT NO.:  [id number]   MEDICAL RECORD NO.:  [id number]                   PATIENT TYPE:  INP   LOCATION:  A214                                 FACILITY:  [facility]   PHYSICIAN:  Alison Murray, M.D.             DATE OF BIRTH:  1946/06/15   DATE OF CONSULTATION:  10/17/2003  DATE OF DISCHARGE:                                   CONSULTATION   REASON FOR CONSULT:  Renal insufficiency.   Tammy Suarez is a patient who has history of type 2 diabetes, hypertension,  obesity, and recent history of cardiac catheterization; presently came with  the complaints of severe recurrent dry cough associated with chest pain with  radiation to her back.  The patient denies any fevers, chills, or sweating.  She denies any nausea, vomiting; overall seems to be doing reasonably good.  The patient also denies any previous history of kidney stone, no history of  renal insufficiency, no history of diabetic retinopathy.   PAST MEDICAL HISTORY:  As stated above, the patient with history of type 2  diabetes for more than five years, history of hypertension for a long  period, history of hypothyroidism, history of cervical spine stenosis with  laminectomy, history of hysterectomy, history of obesity, and also history  of hypokalemia.   ALLERGIES:  She is allergic to DEMEROL.   MEDICATIONS:  Her medications at this moment consist of:  1. Aspirin 81 mg p.o. daily.  2. Tricor 160 mg p.o. daily.  3. Robitussin cough syrup t.i.d.  4. Insulin Novolin 10 units subcu h.s.  5. Synthroid 50 mcg p.o. daily.  6. Metoprolol 25 mg p.o. daily.  7. Protonix 40 mg p.o. daily.  8. Actos 45 mg p.o. daily.  9. Altace 10 mg p.o. daily.  10.      Zocor 40 mg p.o. daily.  11.      IV at 100 mL/hour.  12.      Lortab.   SOCIAL HISTORY:  She smoked about a half a pack a day but she says that she  had stopped about two weeks ago and no history of alcohol use.   FAMILY HISTORY:   Her daughter has history of hypertension.  Her mother died  of MI at age of 41 and her father died of MI at the age of 28, and she had a  brother who died at the age of 61 from cancer.   REVIEW OF SYSTEMS:  No fever, chills, or sweating.  No nausea, no vomiting.  Appetite is reasonable.  Mainly continues a dry cough of a few days  duration.  She also complains of chest pain radiating to her back.  No  abdominal pain.  Moves her bowels reasonably good and no urgency or  frequency.   PHYSICAL EXAMINATION:  VITAL SIGNS:  Her blood pressure is 128/73,  temperature 98, pulse of 59.  HEENT:  No  conjunctivae pallor, nonicteric.  Oral mucosa seems to be dry.  NECK:  Supple.  No JVD.  CHEST:  Decreased breath sounds; otherwise, seems to be clear.  HEART:  Revealed regular rate and rhythm, no murmur.  ABDOMEN:  Soft, positive bowel sounds.  EXTREMITIES:  No edema.   LABORATORY DATA:  Her white blood cell count is 9.9, hemoglobin 8.9,  hematocrit 28, and platelets of 263.  Sodium 133, potassium 5.8, and BUN 33,  creatinine 1.9.  Her calcium is 8.3, phosphorus of 4.6, bicarb 37.6, and  magnesium 1.  At this moment her B natriuretic peptide is 215.  When she  came yesterday BUN ws 33 and creatinine 2.2, potassium 4.9.   ASSESSMENT:  1. Renal insufficiency.  At this moment seems to be acute.  No previous     history of proteinuria, renal insufficiency, or diabetic retinopathy. The     etiology could be multifactorial including possibly related to ACE     inhibitor, prerenal syndrome, and also previous use of dye about two     weeks ago.  2. Hyperkalemia.  Potassium seems to be increasing, possibly related to her     ACE inhibitor.  3. Anemia.  At this moment etiology not clear, acute versus chronic.  Could     be related to her renal insufficiency if she has chronic renal failure.     However, this could be also from iron deficiency anemia.  4. Obesity.  5. History of cough, something new,  etiology not clear.  A chest x-ray     showed cardiomegaly, questionable infiltrate; hence, could be a     bronchitis versus pneumonia.  However, also, the ACE inhibitor may     contribute if this is something new.  6. Coronary artery disease.  She had cardiac catheterization but no     significant stenosis.  7. Hypertension.  Blood pressure seems to be controlled very well.   RECOMMENDATIONS:  Will do ultrasound of the kidney.  I agree with hydration.  Possibly we may need to hold ACE inhibitor.  We may need to put her on a low  potassium diet.  Continue with the other medication as prescribed before.  Will do a UA and also iron studies to make sure that she does not have iron  deficiency anemia.      ___________________________________________                                            Alison Murray, M.D.   BB/MEDQ  D:  10/17/2003  T:  10/17/2003  Job:  [id number]

## 2011-04-03 NOTE — Discharge Summary (Signed)
NAME:  Tammy Suarez, Tammy Suarez                         ACCOUNT NO.:  [id number]   MEDICAL RECORD NO.:  [id number]                   PATIENT TYPE:  INP   LOCATION:  A214                                 FACILITY:  [facility]   PHYSICIAN:  Karlyn Agee, M.D.              DATE OF BIRTH:  1946-03-08   DATE OF ADMISSION:  10/16/2003  DATE OF DISCHARGE:  10/18/2003                                 DISCHARGE SUMMARY   PRIMARY CARE PHYSICIAN:  Norwood Levo. Moshe Cipro, M.D.   CARDIOLOGIST:  [facility].   DISCHARGE DIAGNOSES:  1. Musculoskeletal chest pain.  Myocardial infarction ruled out.  2. Acute renal failure.  3. Bronchitis.  4. Hypokalemia resolved.  5. Normal thyroid stimulating hormone.  6. Diabetes uncontrolled.  7. History of hypothyroidism.  8. Hypertension.  9. Chronic pain syndrome.   DISCHARGE CONDITION:  Stable.   DISPOSITION:  Discharged to home.   DISCHARGE MEDICATIONS:  1. Combivent inhaler two puffs b.i.d.  2. Zithromax 500 mg daily x5 days.  3. Prevacid 50 mg daily.  4. Aspirin 81 mg daily.  5. Actos 45 mg daily.  6. TriCor 160 mg daily.  7. Lipitor 20 mg at bedtime.  8. Detrol LA 4 mg daily.  9. Toprol-XL 50 mg daily.  10.      Altace 5 mg daily.  11.      Humibid LA 600 mg two tablets b.i.d.   HOSPITAL COURSE:  This is a 65 year old Caucasian lady with hyperlipidemia,  morbid obesity, diabetes, and hypertension who had been having symptoms  suggestive of bronchitis with a hacking cough with green sputum for the past  week, presented with right-sided chest pain.  On the advice of her  cardiologist, she was admitted because of her high risk cardiac status to  rule out MI and rule pulmonary embolism.   MI was ruled out by EKGs and three sets of cardiac enzymes.  Pulmonary  embolism was ruled out by a VQ scan and Doppler.  The patient also had a  renal ultrasound in view of her long history of hypertension, which ruled  out polycystic kidney.  On admission, the  patient was noted to have a  creatinine of 2.2 and a BUN of 33.   With her history of a recent cardiac catheterization and a ten day course of  Indocid, a diagnosis of acute renal insufficiency was made.  The patient was  hydrated during her hospital course and her HCT containing medications were  held.  The following day, although her creatinine had decreased to 1.9, her  potassium had risen from 4.9 to 5.8.  Her ACE inhibitor Altace was also  held.   Today the patient's chemistry shows a sodium of 133, a potassium of 4.9, a  chloride of 99, a CO2 of 27, a BUN of 36, a creatinine of 1.9, a glucose of  125, and a calcium of 8.6.  We have  decided that since the patient is a  diabetic and will benefit from an ACE, we will resume her Altace at 1/2 dose  of 5 mg of Altace daily.  She is to do a Chem-7 on Monday and follow up with  Dr. Moshe Cipro with the results.  Consideration could also be given to a  diagnosis of renal artery stenosis because of her history of hypertension  since age 77 and a rising creatinine on an ACE inhibitor.  However, the  diagnosis would be unlikely if her creatinine is rising for the first time  after a prolonged course of ACE inhibitor.   The patient is noted to be anemic with a hemoglobin today of 8.7, a  hematocrit of 26.5.  Her MCV is 89, RDW is 14.  It is not typical of iron  deficiency anemia.  We would recommend continuing investigation as an  outpatient.  Her iron studies have been ordered and are pending but are  likely to be normal, given her MCV and RDW.  A colonoscopy may be indicated.   In view of the patient's cardiac status and the diagnosis of hypothyroidism,  a TSH was done and this was found to be 0.096 with a low limit of normal  being 0.3.  The patient's Synthroid is being held.  A free T-4 has been  ordered and this can be followed up by her primary care physician.  If she  has hyperthyroid status this may be contributing to her anemia and her  self  professed anxiety.   The patient was found to have an excellent lipid profile for a diabetic with  a cholesterol of 18, triglycerides __________ and a LDL of 63.  Her HDL was  31.  Her TriCor and Lipitor are continued.   Since CPK total was never elevated, rhabdomyolysis is not being considered  as contributing to her muscle pains or her hyperkalemia.   The pain overlying her right scapula, presumed to be myalgia, is aggravated  by her persistent coughing.  She has been sent home with Humibid LA,  antibiotics, and Combivent for her cough.   Because of her renal status, her aspirin has been reduced from 325 to 81 mg.  She is to follow up with Dr. Moshe Cipro within a week.   SPECIAL INSTRUCTIONS:  She is to have a blood test done on Monday with a  Chem-7, a repeat TSH, and free T-4 and to follow up Dr. Moshe Cipro with the  results.     ___________________________________________                                         Karlyn Agee, M.D.   LC/MEDQ  D:  10/18/2003  T:  10/18/2003  Job:  [id number]

## 2011-04-03 NOTE — H&P (Signed)
Tammy Suarez, Tammy Suarez               ACCOUNT NO.:  [id number]   MEDICAL RECORD NO.:  [id number]          PATIENT TYPE:  AMB   LOCATION:  DAY                           FACILITY:  [facility]   PHYSICIAN:  Leane Para C. Tamala Julian, M.D.   DATE OF BIRTH:  1946/09/10   DATE OF ADMISSION:  DATE OF DISCHARGE:  LH                                HISTORY & PHYSICAL   65 year old female referred for colonoscopy.  She has had colonoscopy in the  remote past, this is over ten years ago.  She does not remember the exact  date.  She has problems with constipation but no rectal bleeding.  Negative  family history of colon cancer.  She will have repeat colonoscopy for  screening.   PAST MEDICAL HISTORY:  Diabetes mellitus, hypertension, low back pain,  hypothyroidism, depression, gastroesophageal reflux disease, osteoarthritis,  and morbid obesity.   PAST SURGICAL HISTORY:  Total abdominal hysterectomy, cholecystectomy,  oophorectomy, umbilical hernia repair, bladder suspension repair, cervical  disc surgery, lumbar disc surgery.   ALLERGIES:  DARVOCET AND DEMEROL.   MEDICATIONS:  Aspirin 81 mg daily, Lortab one q.4h. p.r.n., and Levoxyl 50  mcg daily, Cymbalta 60 mg daily, Prevacid 15 mg daily, Ziac 10 mg daily,  Amitriptyline 25 mg daily, calcium plus vitamin D three times daily, Actos  15/850 b.i.d., Toprol XL 50 mg daily, Vytorin 10/40 daily, Tricor 145 mg  daily, Lidoderm 1/2% patches, 2 patches q.12h.   PHYSICAL EXAMINATION:  VITAL SIGNS:  Blood pressure 140/64, pulse 62, respirations 20, weight 300  pounds.  HEENT:  Unremarkable.  NECK:  She has a posterior cervical scar, good range of motion, no JVD,  bruit, adenopathy, or thyromegaly.  LUNGS:  Clear to auscultation.  HEART:  Regular rate and rhythm, without murmurs, gallops, and rubs.  ABDOMEN:  Right subcostal incision well healed, active bowel sounds, no  masses, large panniculus.  EXTREMITIES:  No cyanosis, clubbing, and edema.  NEUROLOGICAL:  No  focal motor, sensory, or cerebellar deficit.   IMPRESSION:  1.  Need for screening colonoscopy.  2.  Diabetes mellitus.  3.  Hypertension.  4.  Back pain.  5.  Gastroesophageal reflux disease.  6.  Osteoarthritis.  7.  Hypothyroidism.  8.  Morbid obesity.   PLAN:  Screening colonoscopy.      Vernon Prey. Tamala Julian, M.D.  Electronically Signed     LCS/MEDQ  D:  02/17/2006  T:  02/17/2006  Job:  [id number]

## 2011-04-03 NOTE — H&P (Signed)
NAME:  Tammy Suarez, Tammy Suarez                         ACCOUNT NO.:  [id number]   MEDICAL RECORD NO.:  [id number]                   PATIENT TYPE:  INP   LOCATION:  A214                                 FACILITY:  [facility]   PHYSICIAN:  Karlyn Agee, M.D.              DATE OF BIRTH:  [birth year]   DATE OF ADMISSION:  10/17/2003  DATE OF DISCHARGE:                                HISTORY & PHYSICAL   CHIEF COMPLAINT:  Right chest pain times a few hours.   HISTORY OF PRESENTING ILLNESS:  This is a middle-aged Caucasian lady with  multiple cardiac risk factors including type 2 diabetes, hypertension,  hyperlipidemia, severe obesity, morbid obesity, who had a cardiac  catheterization four weeks ago, which was found to have lesions of 20-40%.  Discharged with intensive cardiac risk reduction regimen, who came to the  hospital today to do blood work, and then while driving home developed the  sudden onset of severe 10/10 right subscapular chest pain, stabbing in  nature, and shooting through to the anterior chest, associated with numbness  of the right upper extremity.  The pain was aggravated by turning her neck,  or by moving her chest by breathing in or out.  The patient drove home and  tried to rest for an hour, but could get no relief from the pain.  The pain  was not associated with palpitations, shortness of breath, nausea, vomiting,  diaphoresis, dizziness, or syncope.   The patient has baseline dyspnea on exertion crossing the street or climbing  a few stairs.  She has no orthopnea or PND.  Has occasional swelling of the  feet.   The patient has had an upper respiratory infection for one week associated  with a hacking cough, bringing up green to brown sputum with difficulty, and  hoarseness since the onset of this upper respiratory infection.  There has  been no fever.   The patient has a history of surgery to the cervical spine eight years ago  for a spur compressing a nerve root.   The patient has urgency relieved by Detrol.   PAST MEDICAL HISTORY:  1. Hypertension since age 65.  2. Diabetes type 2 for the past five years.  3. Hypothyroidism.  4. Right L3-4 laminectomy and microdiskectomy in June of 2003.  5. Cervical spine surgery 7-8 years ago.  6. Status post hysterectomy over 20 years ago.   MEDICATIONS:  1. Aspirin 325 mg daily.  2. Prevacid 15 mg daily.  3. Actos 45 mg daily.  4. Altace 10 mg daily.  5. Toprol-XL 50 mg daily.  6. Levoxyl 50 mcg daily.  7. Endocet.  She has had a tapering course of this for the past week or two     because of pains in her feet described as gout.  8. Bisoprolol/HCTZ 10 mg/6.25 mg daily.  9. Tricor 160 mg daily.  10.  Lipitor 20 mg at bedtime.  11.      Detrol 4 mg daily.  12.      KCl 20 mEq occasional for cramps.   ALLERGIES:  DEMEROL causes nausea.   SOCIAL HISTORY:  She smoked one pack per day of cigarettes for the past 20  years.  She quit for the past two weeks.  Denies alcohol or drug use.  Has  never been married.  Has two girls, ages 59 and 52.   FAMILY HISTORY:  Her 68 year old daughter has hypertension.  She has a  brother who died at age 83 from cancer of the kidney.  Her father died of an  MI at age 35.  Her mother died of an MI at age 75.   REVIEW OF SYSTEMS:  Nil for other contributory.   PHYSICAL EXAMINATION:  GENERAL:  An obese middle-aged Caucasian lady lying  in bed and very thirsty.  VITAL SIGNS:  Temperature is 99.7, pulse 57, respirations 18, blood pressure  127/77.  Saturating at 98%.  HEENT:  She has moon facies.  A hoarse, quiet voice.  Pupils are equal and  reactive.  NECK:  No lymphadenopathy.  A thick neck.  CHEST:  She has point tenderness at a central point over the right scapula.  It is clear to auscultation bilaterally.  CARDIOVASCULAR:  Regular rhythm.  No murmurs.  ABDOMEN:  Grossly obese, soft, nontender.  No organomegaly.  EXTREMITIES:  There is 1+ edema, 1+ pulses  bilaterally.  CNS:  Alert and oriented x3.  No focal deficits.   LABORATORY DATA:  Hemoglobin 9.3, hematocrit 28.5, white count 10.1, 59%  neutrophils.  Her MCV is 90, and her RDW is 14.1.  Platelets are 296.  Sodium is 139, potassium 4.9, chloride 102, CO2 27, BUN 33, creatinine 2.2,  glucose 97, calcium 8.8.  INR is 1.0, PT 13.3, PTT 27, total protein 6.2,  albumin 3.2, total bilirubin 0.3, alkaline phosphatase 44, AST 12, ALT 9.  D-  dimer 0.5.  Her ABG was 7.38, 40, 82, 96% saturation.  CK total first set  84, MB 1.3, troponin 0.01.  Chest x-ray report shows bronchitic changes  versus perihilar infiltrates.  Her EKG shows normal sinus rhythm at a rate  of 54, leftward axis, and inverted and flattening of the T waves in II, III,  and aVF.  Borderline left atrial enlargement.   ASSESSMENT:  1. Chest pain suspicious for musculoskeletal disease, but in view of her     very high risk cardiac status, it would be wise to rule out myocardial     infarction.  In view of her obesity and the sudden onset, it would also     be wise to rule out pulmonary embolism.  Because of her renal     insufficiency, a VQ scan is recommended.  We will anticoagulate her with     heparin once the Lovenox she has been given has worn off.  2. Renal failure.  Unable to ascertain at this time whether it is acute or     chronic, but we do know that the patient had a catheterization for months     ago, and she has recently been taking Endocet.  Either of these could be     contributing to her acute renal insufficiency, but in view of the anemia,     this could be a chronic renal insufficiency.  Will get a serum phosphate     and a magnesium.  Will image her kidneys, and we will hydrate her.  3. Anemia.  Cause undetermined.  May be related to chronic renal     insufficiency.  It seems to be a normocytic anemia with an RDW of 14.  4. Hypertension at age 40.  Not sure if the cause of this hypertension has    been  investigated, but she says her kidneys have never been imaged.     Ultrasound of the kidneys will serve a double purpose of excluding     polycystic kidney disease.  Especially, she has the body habitus of     somebody with polycystic ovary.  5. Diabetes.  It seems to be fairly well controlled on one anti-diabetic     medication.  Will continue to monitor her diabetes.  6. Hypothyroidism.  Will check her status there and continue.  She does seem     to be dehydrated.  She is very thirsty.  We will hydrate her, and it may     improve her cardiac function.  We will hold her bisoprolol, as she is     already on a diuretic.  Will hold her HCTZ because of her renal     insufficiency.  Will lower her dose of aspirin because of her renal in     sufficiency.  We will continue her Altace at 10 mg despite the creatinine     of 2.2.     ___________________________________________                                         Karlyn Agee, M.D.   LC/MEDQ  D:  10/17/2003  T:  10/17/2003  Job:  [id number]   cc:   Norwood Levo. Moshe Cipro, M.D.  [address]  [place], [place] [address]  Fax: [phone]

## 2011-04-03 NOTE — Op Note (Signed)
[facility]  Patient:    Tammy Suarez, Tammy Suarez Visit Number: [id number] MRN: [id number]          Service Type: SUR Location: [id number] Attending Physician:  Donia Guiles Dictated by:   Hosie Spangle, M.D. Proc. Date: 04/26/02 Admit Date:  04/26/2002 Discharge Date: 04/27/2002                             Operative Report  PREOPERATIVE DIAGNOSIS:  Right L3-4 foraminal lumbar disk herniation.  POSTOPERATIVE DIAGNOSIS:  Right L3-4 foraminal lumbar disk herniation.  PROCEDURE:  Right L3-4 lumbar laminotomy and microdiskectomy.  SURGEON:  Hosie Spangle, M.D.  ASSISTANT:  Vickki Muff. Christella Noa, M.D.  ANESTHESIA:  General endotracheal.  INDICATION:  The patient is a 65 year old woman who presented with right lumbar radiculopathy with significant right iliopsoas weakness and was found to have a disk herniation with a fragment within the right L3-4 foramen directly compressing the right L3 nerve root.  A decision was made to proceed with laminotomy and microdiskectomy.  DESCRIPTION OF PROCEDURE:  The patient was brought to the operating room and placed under general endotracheal anesthesia.  She was turned to the prone position and the lumbar region was prepped with Betadine soap and solution and draped in sterile fashion.  An x-ray was taken and the L3-4 level was identified.  The midline was infiltrated with local anesthetic with epinephrine and the midline incision made, carried down through the subcutaneous tissue with bipolar cautery and electrocautery used to maintain hemostasis.  Dissection was carried down to the lumbar fascia, which was incised on the right side of the midline and the paraspinal muscles were dissected from the spinous processes and laminae in subperiosteal fashion. X-rays were taken to localize the L3-4 interlaminar space and then the microscope was draped and brought into the field to provide  additional magnification, illumination, and visualization, and the remainder of the procedure was performed using microdissection and microsurgical technique.  A laminotomy was performed using the [facility] Max drill and Kerrison punches. Ligamentum flavum was carefully resected and then we were able to expose the lateral aspect of the epidural space.  The thecal sac was gently retracted medially, and we identified the disk herniation.  Epidural veins were coagulated as necessary, and then the disk was removed in a single large fragment.  The epidural space and foramen was identified, and no further fragments were felt.  It was felt that good decompression had been achieved. The wound was irrigated with bacitracin solution and checked for hemostasis, which was established with the use of bipolar cautery as well as Gelfoam soaked in thrombin.  All the Gelfoam was removed prior to closure. Immediately prior to closure and once hemostasis had been established, 2 cc of fentanyl were infused into the epidural space and then the deep fascia closed with interrupted, undyed 1 Vicryl sutures, the subcutaneous layer was closed with interrupted, inverted undyed 1 Vicryl sutures, and the subcutaneous and subcuticular were closed with interrupted, inverted 2-0 undyed Vicryl sutures, and the skin was reapproximated with Dermabond.  The patient tolerated the procedure well.  The estimated blood loss was 50 cc.  The sponge and needle count were correct.  Following surgery the patient was turned back to the supine position, to be reversed from the anesthetic, extubated, and transferred to the recovery room for further care. Dictated by:   Hosie Spangle, M.D. Attending Physician:  Donia Guiles DD:  04/26/02 TD:  04/28/02 Job: [id number]

## 2011-04-19 ENCOUNTER — Other Ambulatory Visit: Payer: Self-pay | Admitting: Family Medicine

## 2011-04-23 ENCOUNTER — Other Ambulatory Visit: Payer: Self-pay | Admitting: Family Medicine

## 2011-05-27 ENCOUNTER — Other Ambulatory Visit: Payer: Self-pay | Admitting: *Deleted

## 2011-05-27 ENCOUNTER — Telehealth: Payer: Self-pay | Admitting: Family Medicine

## 2011-05-27 MED ORDER — HYDROCODONE-ACETAMINOPHEN 10-500 MG PO TABS: 1.0000 | ORAL_TABLET | Freq: Four times a day (QID) | ORAL | Status: DC

## 2011-05-27 NOTE — Telephone Encounter (Signed)
Med sent as requested 

## 2011-05-29 ENCOUNTER — Other Ambulatory Visit: Payer: Self-pay | Admitting: Family Medicine

## 2011-05-29 LAB — HEPATIC FUNCTION PANEL
ALT: <8 U/L (ref 0–35)
AST: 16 U/L (ref 0–37)
Albumin: 4.1 g/dL (ref 3.5–5.2)
Alkaline Phosphatase: 31 U/L — ABNORMAL LOW (ref 39–117)
Total Protein: 6.9 g/dL (ref 6.0–8.3)

## 2011-05-29 LAB — LIPID PANEL
HDL: 41 mg/dL (ref >39)
Total CHOL/HDL Ratio: 4.9 Ratio
Triglycerides: 207 mg/dL — ABNORMAL HIGH (ref <150)

## 2011-05-29 LAB — TSH: TSH: 1.868 u[IU]/mL (ref 0.350–4.500)

## 2011-05-29 LAB — BASIC METABOLIC PANEL
CO2: 25 mEq/L (ref 19–32)
Calcium: 11.3 mg/dL — ABNORMAL HIGH (ref 8.4–10.5)
Creat: 1.64 mg/dL — ABNORMAL HIGH (ref 0.50–1.10)
Sodium: 136 mEq/L (ref 135–145)

## 2011-06-10 ENCOUNTER — Other Ambulatory Visit: Payer: Self-pay | Admitting: Family Medicine

## 2011-06-11 ENCOUNTER — Other Ambulatory Visit: Payer: Self-pay | Admitting: Family Medicine

## 2011-06-17 ENCOUNTER — Encounter: Payer: Self-pay | Admitting: Family Medicine

## 2011-06-18 ENCOUNTER — Encounter: Payer: Self-pay | Admitting: Family Medicine

## 2011-06-19 ENCOUNTER — Ambulatory Visit (INDEPENDENT_AMBULATORY_CARE_PROVIDER_SITE_OTHER): Payer: Medicare Other | Admitting: Family Medicine

## 2011-06-19 ENCOUNTER — Other Ambulatory Visit: Payer: Self-pay | Admitting: Family Medicine

## 2011-06-19 ENCOUNTER — Encounter: Payer: Self-pay | Admitting: Family Medicine

## 2011-06-19 VITALS — BP 120/70 | HR 75 | Resp 16 | Ht 65.0 in | Wt 199.8 lb

## 2011-06-19 DIAGNOSIS — R5383 Other fatigue: Secondary | ICD-10-CM

## 2011-06-19 DIAGNOSIS — E785 Hyperlipidemia, unspecified: Secondary | ICD-10-CM

## 2011-06-19 DIAGNOSIS — E119 Type 2 diabetes mellitus without complications: Secondary | ICD-10-CM

## 2011-06-19 DIAGNOSIS — F411 Generalized anxiety disorder: Secondary | ICD-10-CM

## 2011-06-19 DIAGNOSIS — E039 Hypothyroidism, unspecified: Secondary | ICD-10-CM

## 2011-06-19 DIAGNOSIS — M549 Dorsalgia, unspecified: Secondary | ICD-10-CM

## 2011-06-19 DIAGNOSIS — E669 Obesity, unspecified: Secondary | ICD-10-CM

## 2011-06-19 DIAGNOSIS — F419 Anxiety disorder, unspecified: Secondary | ICD-10-CM

## 2011-06-19 DIAGNOSIS — N644 Mastodynia: Secondary | ICD-10-CM

## 2011-06-19 DIAGNOSIS — R5381 Other malaise: Secondary | ICD-10-CM

## 2011-06-19 DIAGNOSIS — B379 Candidiasis, unspecified: Secondary | ICD-10-CM

## 2011-06-19 DIAGNOSIS — I10 Essential (primary) hypertension: Secondary | ICD-10-CM

## 2011-06-19 MED ORDER — CLONAZEPAM 1 MG PO TABS: 1.0000 mg | ORAL_TABLET | Freq: Three times a day (TID) | ORAL | Status: DC

## 2011-06-19 MED ORDER — AMITRIPTYLINE HCL 25 MG PO TABS: ORAL_TABLET | ORAL | Status: DC

## 2011-06-19 MED ORDER — RANITIDINE HCL 150 MG PO TABS: ORAL_TABLET | ORAL | Status: DC

## 2011-06-19 MED ORDER — LORATADINE 10 MG PO TABS: 10.0000 mg | ORAL_TABLET | Freq: Every day | ORAL | Status: DC

## 2011-06-19 MED ORDER — LEVOTHYROXINE SODIUM 50 MCG PO TABS: ORAL_TABLET | ORAL | Status: DC

## 2011-06-19 MED ORDER — KETOROLAC TROMETHAMINE 30 MG/ML IJ SOLN
60.0000 mg | Freq: Once | INTRAMUSCULAR | Status: AC
  Administered 2011-06-19: 60 mg via INTRAMUSCULAR

## 2011-06-19 MED ORDER — DULOXETINE HCL 60 MG PO CPEP: ORAL_CAPSULE | ORAL | Status: DC

## 2011-06-19 MED ORDER — CHOLINE FENOFIBRATE 135 MG PO CPDR: DELAYED_RELEASE_CAPSULE | ORAL | Status: DC

## 2011-06-19 MED ORDER — SIMVASTATIN 20 MG PO TABS: ORAL_TABLET | ORAL | Status: DC

## 2011-06-19 MED ORDER — METHYLPREDNISOLONE ACETATE 80 MG/ML IJ SUSP
80.0000 mg | Freq: Once | INTRAMUSCULAR | Status: AC
  Administered 2011-06-19: 80 mg via INTRAMUSCULAR

## 2011-06-19 NOTE — Patient Instructions (Addendum)
CPE early December.  You will get injections for back pain.  LABWORK  NEEDS TO BE DONE BETWEEN 3 TO 7 DAYS BEFORE YOUR NEXT SCEDULED  VISIT.  THIS WILL IMPROVE THE QUALITY OF YOUR CARE.   Fasting labs and urine before next visit.  Pls reduce fried and fatty foods your cholesterol is too high.   Please think about quitting smoking.  This is very important for your health.  Consider setting a quit date, then cutting back or switching brands to prepare to stop.  Also think of the money you will save every day by not smoking.  Quick Tips to Quit Smoking: Fix a date i.e. keep a date in mind from when you would not touch a tobacco product to smoke  Keep yourself busy and block your mind with work loads or reading books or watching movies in malls where smoking is not allowed  Vanish off the things which reminds you about smoking for example match box, or your favorite lighter, or the pipe you used for smoking, or your favorite jeans and shirt with which you used to enjoy smoking, or the club where you used to do smoking  Try to avoid certain people places and incidences where and with whom smoking is a common factor to add on  Praise yourself with some token gifts from the money you saved by stopping smoking  Anti Smoking teams are there to help you. Join their programs  Anti-smoking Gums are there in many medical shops. Try them to quit smoking   Side-effects of Smoking: Disease caused by smoking cigarettes are emphysema, bronchitis, heart failures  Premature death  Cancer is the major side effect of smoking  Heart attacks and strokes are the quick effects of smoking causing sudden death  Some smokers lives end up with limbs amputated  Breathing problem or fast breathing is another side effect of smoking  Due to more intakes of smokes, carbon mono-oxide goes into your brain and other muscles of the body which leads to swelling of the veins and blockage to the air passage to lungs  Carbon  monoxide blocks blood vessels which leads to blockage in the flow of blood to different major body organs like heart lungs and thus leads to attacks and deaths  During pregnancy smoking is very harmful and leads to premature birth of the infant, spontaneous abortions, low weight of the infant during birth  Fat depositions to narrow and blocked blood vessels causing heart attacks  In many cases cigarette smoking caused infertility in men    You will be referred for a diagnostic mammogram of left breast due to new pain  Keep skin folds dry with cornstarch and use nystatin powder as needed.  It is important that you exercise regularly at least 30 minutes 5 times a week. If you develop chest pain, have severe difficulty breathing, or feel very tired, stop exercising immediately and seek medical attention  A healthy diet is rich in fruit, vegetables and whole grains. Poultry fish, nuts and beans are a healthy choice for protein rather then red meat. A low sodium diet and drinking 64 ounces of water daily is generally recommended. Oils and sweet should be limited. Carbohydrates especially for those who are diabetic or overweight, should be limited to 34-45 gram per meal. It is important to eat on a regular schedule, at least 3 times daily. Snacks should be primarily fruits, vegetables or nuts.

## 2011-06-21 DIAGNOSIS — N644 Mastodynia: Secondary | ICD-10-CM | POA: Insufficient documentation

## 2011-06-21 NOTE — Assessment & Plan Note (Signed)
Current flare, pt to use nystatin powder

## 2011-06-21 NOTE — Assessment & Plan Note (Signed)
Unchanged. Patient re-educated about  the importance of commitment to a  minimum of 150 minutes of exercise per week. The importance of healthy food choices with portion control discussed. Encouraged to start a food diary, count calories and to consider  joining a support group. Sample diet sheets offered. Goals set by the patient for the next several months.    

## 2011-06-21 NOTE — Assessment & Plan Note (Signed)
Acute flare of pain , injections administered in the office for acute pain

## 2011-06-21 NOTE — Progress Notes (Signed)
  Subjective:    Patient ID: Tammy Suarez, female    DOB: [birth year], 65 y.o.   MRN: [id number]  HPI The PT is here for follow up and re-evaluation of chronic medical conditions, medication management and review of any available recent lab and radiology data.  Preventive health is updated, specifically  Cancer screening and Immunization.   Questions or concerns regarding consultations or procedures which the PT has had in the interim are  addressed. The PT denies any adverse reactions to current medications since the last visit.  There are no new concerns.  C/o increased back pain x 5 days, no aggravating factor C/o increase family stress and responsibility, smoking increased cigaretes C/o left breast pain x 1 week , no trauma to same. Also feels a fullness in the area      Review of Systems Denies recent fever or chills. Denies sinus pressure, nasal congestion, ear pain or sore throat. Denies chest congestion, productive cough or wheezing. Denies chest pains, palpitations and leg swelling Denies abdominal pain, nausea, vomiting,diarrhea or constipation.   Denies dysuria, frequency, hesitancy or incontinence. Decreased rOM spine, adequate in hips and knees Denies headaches, seizures, numbness, or tingling. Denies depression, anxiety or insomnia. Denies skin break down , has pruritic rash under breasts and in lower wbdominal folds        Objective:   Physical Exam Patient alert and oriented and in no cardiopulmonary distress.  HEENT: No facial asymmetry, EOMI, no sinus tenderness,  oropharynx pink and moist.  Neck supple no adenopathy.  Chest: Clear to auscultation bilaterally  Breast: right normal, left tender in enner lower quadrant, possible nodule also.  CVS: S1, S2 no murmurs, no S3.  ABD: Soft non tender. Bowel sounds normal.  Ext: No edema  MS: dece ROM spine, shoulders, hips and knees.  Skin: Intact, candidiasis under breasts and in groin  Psych: Good eye  contact, normal affect. Memory intact not anxious or depressed appearing.  CNS: CN 2-12 intact, power, tone and sensation normal throughout.        Assessment & Plan:

## 2011-06-21 NOTE — Assessment & Plan Note (Signed)
New left brest pain , will refer for diagnostic mammogram

## 2011-06-21 NOTE — Assessment & Plan Note (Signed)
Adequately controled, no change in managment

## 2011-06-21 NOTE — Assessment & Plan Note (Signed)
Controlled, no change in management, diet controlled  

## 2011-06-25 ENCOUNTER — Other Ambulatory Visit: Payer: Self-pay | Admitting: Family Medicine

## 2011-06-29 ENCOUNTER — Telehealth: Payer: Self-pay | Admitting: Family Medicine

## 2011-06-29 MED ORDER — HYDROCODONE-ACETAMINOPHEN 10-500 MG PO TABS: 1.0000 | ORAL_TABLET | Freq: Four times a day (QID) | ORAL | Status: DC

## 2011-06-29 NOTE — Telephone Encounter (Signed)
Med sent as requested 

## 2011-07-04 ENCOUNTER — Other Ambulatory Visit: Payer: Self-pay | Admitting: Family Medicine

## 2011-07-08 ENCOUNTER — Ambulatory Visit (HOSPITAL_COMMUNITY)
Admission: RE | Admit: 2011-07-08 | Discharge: 2011-07-08 | Disposition: A | Discharge status: Home / Self Care | Payer: Medicare Other | Source: Ambulatory Visit | Attending: Family Medicine | Admitting: Family Medicine

## 2011-07-08 DIAGNOSIS — N644 Mastodynia: Secondary | ICD-10-CM | POA: Insufficient documentation

## 2011-07-15 ENCOUNTER — Other Ambulatory Visit: Payer: Self-pay | Admitting: Family Medicine

## 2011-08-13 ENCOUNTER — Telehealth: Payer: Self-pay | Admitting: Family Medicine

## 2011-08-13 NOTE — Telephone Encounter (Signed)
Refill x 1 please 

## 2011-08-13 NOTE — Telephone Encounter (Signed)
Is it okay to refill indomethacin

## 2011-08-15 ENCOUNTER — Other Ambulatory Visit: Payer: Self-pay | Admitting: *Deleted

## 2011-08-15 MED ORDER — INDOMETHACIN 50 MG PO CAPS: 50.0000 mg | ORAL_CAPSULE | ORAL | Status: DC

## 2011-08-15 NOTE — Telephone Encounter (Signed)
Med sent.

## 2011-09-14 ENCOUNTER — Other Ambulatory Visit: Payer: Self-pay | Admitting: Family Medicine

## 2011-10-08 ENCOUNTER — Other Ambulatory Visit: Payer: Self-pay | Admitting: Family Medicine

## 2011-10-08 LAB — LIPID PANEL
HDL: 37 mg/dL — ABNORMAL LOW (ref >39)
LDL Cholesterol: 104 mg/dL — ABNORMAL HIGH (ref 0–99)
Total CHOL/HDL Ratio: 4.6 Ratio
VLDL: 31 mg/dL (ref 0–40)

## 2011-10-08 LAB — COMPLETE METABOLIC PANEL WITH GFR
ALT: <8 U/L (ref 0–35)
AST: 12 U/L (ref 0–37)
Albumin: 3.9 g/dL (ref 3.5–5.2)
Alkaline Phosphatase: 34 U/L — ABNORMAL LOW (ref 39–117)
Potassium: 5.1 mEq/L (ref 3.5–5.3)
Sodium: 139 mEq/L (ref 135–145)
Total Bilirubin: 0.3 mg/dL (ref 0.3–1.2)
Total Protein: 6 g/dL (ref 6.0–8.3)

## 2011-10-08 LAB — MICROALBUMIN / CREATININE URINE RATIO: Creatinine, Urine: 83.6 mg/dL

## 2011-10-08 LAB — HEMOGLOBIN A1C: Hgb A1c MFr Bld: 5.3 % (ref <5.7)

## 2011-10-13 ENCOUNTER — Other Ambulatory Visit: Payer: Self-pay

## 2011-10-13 MED ORDER — LEVOTHYROXINE SODIUM 50 MCG PO TABS: ORAL_TABLET | ORAL | Status: DC

## 2011-10-14 ENCOUNTER — Encounter: Payer: Self-pay | Admitting: Family Medicine

## 2011-10-17 ENCOUNTER — Other Ambulatory Visit: Payer: Self-pay | Admitting: Family Medicine

## 2011-10-19 ENCOUNTER — Encounter: Payer: Self-pay | Admitting: Family Medicine

## 2011-10-20 ENCOUNTER — Ambulatory Visit (INDEPENDENT_AMBULATORY_CARE_PROVIDER_SITE_OTHER): Payer: Medicare Other | Admitting: Family Medicine

## 2011-10-20 ENCOUNTER — Encounter: Payer: Self-pay | Admitting: Family Medicine

## 2011-10-20 ENCOUNTER — Other Ambulatory Visit (HOSPITAL_COMMUNITY)
Admission: RE | Admit: 2011-10-20 | Discharge: 2011-10-20 | Disposition: A | Discharge status: Home / Self Care | Payer: Medicare Other | Source: Ambulatory Visit | Attending: Family Medicine | Admitting: Family Medicine

## 2011-10-20 VITALS — BP 124/78 | HR 73 | Resp 18 | Ht 65.0 in | Wt 203.4 lb

## 2011-10-20 DIAGNOSIS — Z124 Encounter for screening for malignant neoplasm of cervix: Secondary | ICD-10-CM | POA: Insufficient documentation

## 2011-10-20 DIAGNOSIS — Z01419 Encounter for gynecological examination (general) (routine) without abnormal findings: Secondary | ICD-10-CM

## 2011-10-20 DIAGNOSIS — Z1211 Encounter for screening for malignant neoplasm of colon: Secondary | ICD-10-CM

## 2011-10-20 DIAGNOSIS — Z0000 Encounter for general adult medical examination without abnormal findings: Secondary | ICD-10-CM

## 2011-10-20 DIAGNOSIS — E039 Hypothyroidism, unspecified: Secondary | ICD-10-CM

## 2011-10-20 DIAGNOSIS — I10 Essential (primary) hypertension: Secondary | ICD-10-CM

## 2011-10-20 DIAGNOSIS — E669 Obesity, unspecified: Secondary | ICD-10-CM

## 2011-10-20 DIAGNOSIS — Z23 Encounter for immunization: Secondary | ICD-10-CM

## 2011-10-20 DIAGNOSIS — E785 Hyperlipidemia, unspecified: Secondary | ICD-10-CM

## 2011-10-20 LAB — HEMOCCULT GUIAC POC 1CARD (OFFICE)

## 2011-10-20 NOTE — Patient Instructions (Addendum)
F/u in 4 months  Flu vaccine today.  Please schedule an eye exam   Fasting lipid, hepatic, chem 7 and TSH in 4 months   Blood work has improved and is excellent overall.  Weight loss goal of 5 to 7 pounds in the next 4 months.  Please be careful not to fall!

## 2011-10-20 NOTE — Progress Notes (Signed)
  Subjective:    Patient ID: Tammy Suarez, female    DOB: 08/29/1946, 65 y.o.   MRN: [id number]  HPI The PT is here for annual exam and re-evaluation of chronic medical conditions, medication management and review of any available recent lab and radiology data.  Preventive health is updated, specifically  Cancer screening and Immunization.   Questions or concerns regarding consultations or procedures which the PT has had in the interim are  addressed. The PT denies any adverse reactions to current medications since the last visit.  There are no new concerns.  There are no specific complaints       Review of Systems See HPI Denies recent fever or chills. Denies sinus pressure, nasal congestion, ear pain or sore throat. Denies chest congestion, productive cough or wheezing. Denies chest pains, palpitations and leg swelling Denies abdominal pain, nausea, vomiting,diarrhea or constipation.   Denies dysuria, frequency, hesitancy or incontinence. Chronic back pain with  limitation in mobility. Denies headaches, seizures, numbness, or tingling. Denies uncontrolled  depression, anxiety or insomnia. Has bruise where she recently fell in her yard        Objective:   Physical Exam Pleasant obese female, alert and oriented x 3, in no cardio-pulmonary distress. Afebrile. HEENT No facial trauma or asymetry. Sinuses non tender.  EOMI, PERTL, fundoscopic exam is normal, no hemorhage or exudate.  External ears normal, tympanic membranes clear. Oropharynx moist, no exudate, poor dentition. Neck: supple, no adenopathy,JVD or thyromegaly.No bruits.  Chest: Clear to ascultation bilaterally.No crackles or wheezes. Non tender to palpation  Breast: No asymetry,no masses. No nipple discharge or inversion. No axillary or supraclavicular adenopathy  Cardiovascular system; Heart sounds normal,  S1 and  S2 ,no S3.  No murmur, or thrill. Apical beat not displaced Peripheral pulses  normal.  Abdomen: Soft, non tender, no organomegaly or masses. No bruits. Bowel sounds normal. No guarding, tenderness or rebound.  Rectal:  No mass. Guaiac negative stool.  GU: External genitalia normal. No lesions. Vaginal canal normal.No discharge. Uterus absent, no adnexal masses, no adnexal tenderness.  Musculoskeletal exam: Decreased  ROM of spine, hips , shoulders and knees. No deformity ,swelling or crepitus noted. No muscle wasting or atrophy.   Neurologic: Cranial nerves 2 to 12 intact. Power, tone ,sensation and reflexes normal throughout. No disturbance in gait. No tremor.  Skin: Intact, bruising on lower extremity with recent trauma Pigmentation normal throughout  Psych; Normal mood and affect. Judgement and concentration normal  Diabetic Foot Check:  Appearance -  calluses Skin - no unusual pallor or redness Sensation - grossly intact to light touch Monofilament testing -  Right - Great toe, medial, central, lateral ball and posterior foot diminished Left - Great toe, medial, central, lateral ball and posterior foot diminished Pulses Left - Dorsalis Pedis and Posterior Tibia normal Right - Dorsalis Pedis and Posterior Tibia normal       Assessment & Plan:

## 2011-10-25 NOTE — Assessment & Plan Note (Signed)
Controlled, no change in medication  

## 2011-10-25 NOTE — Assessment & Plan Note (Signed)
Deterioratunchanged. Patient re-educated about  the importance of commitment to a  minimum of 150 minutes of exercise per week. The importance of healthy food choices with portion control discussed. Encouraged to start a food diary, count calories and to consider  joining a support group. Sample diet sheets offered. Goals set by the patient for the next several months.

## 2011-10-26 ENCOUNTER — Other Ambulatory Visit: Payer: Self-pay | Admitting: Family Medicine

## 2011-10-26 MED ORDER — RANITIDINE HCL 150 MG PO TABS: ORAL_TABLET | ORAL | Status: DC

## 2011-10-26 MED ORDER — HYDROCODONE-ACETAMINOPHEN 10-500 MG PO TABS: 1.0000 | ORAL_TABLET | Freq: Four times a day (QID) | ORAL | Status: DC

## 2011-10-26 NOTE — Telephone Encounter (Signed)
Sent in

## 2011-11-02 ENCOUNTER — Encounter: Payer: Self-pay | Admitting: Family Medicine

## 2011-11-06 ENCOUNTER — Other Ambulatory Visit: Payer: Self-pay | Admitting: Family Medicine

## 2011-11-14 ENCOUNTER — Other Ambulatory Visit: Payer: Self-pay | Admitting: Family Medicine

## 2011-11-23 ENCOUNTER — Telehealth: Payer: Self-pay | Admitting: Family Medicine

## 2011-11-25 ENCOUNTER — Other Ambulatory Visit: Payer: Self-pay

## 2011-11-25 ENCOUNTER — Telehealth: Payer: Self-pay | Admitting: Family Medicine

## 2011-11-25 DIAGNOSIS — F419 Anxiety disorder, unspecified: Secondary | ICD-10-CM

## 2011-11-25 MED ORDER — CLONAZEPAM 1 MG PO TABS: 1.0000 mg | ORAL_TABLET | Freq: Three times a day (TID) | ORAL | Status: DC

## 2011-11-25 NOTE — Telephone Encounter (Signed)
Printed for Dr Moshe Cipro to sign and then will fax

## 2011-11-26 NOTE — Telephone Encounter (Signed)
Already faxed in yesterday

## 2011-12-10 ENCOUNTER — Other Ambulatory Visit: Payer: Self-pay | Admitting: Family Medicine

## 2012-01-28 DIAGNOSIS — H269 Unspecified cataract: Secondary | ICD-10-CM | POA: Diagnosis not present

## 2012-02-01 ENCOUNTER — Telehealth: Payer: Self-pay | Admitting: Family Medicine

## 2012-02-01 DIAGNOSIS — E039 Hypothyroidism, unspecified: Secondary | ICD-10-CM | POA: Diagnosis not present

## 2012-02-01 DIAGNOSIS — I10 Essential (primary) hypertension: Secondary | ICD-10-CM | POA: Diagnosis not present

## 2012-02-01 DIAGNOSIS — E785 Hyperlipidemia, unspecified: Secondary | ICD-10-CM | POA: Diagnosis not present

## 2012-02-01 NOTE — Telephone Encounter (Signed)
Without seeing it Dr will not prescribe anything. Please work her into tomorrows schedule

## 2012-02-02 LAB — HEPATIC FUNCTION PANEL
ALT: <8 U/L (ref 0–35)
AST: 23 U/L (ref 0–37)
Alkaline Phosphatase: 32 U/L — ABNORMAL LOW (ref 39–117)
Bilirubin, Direct: 0.1 mg/dL (ref 0.0–0.3)
Indirect Bilirubin: 0.3 mg/dL (ref 0.0–0.9)

## 2012-02-02 LAB — BASIC METABOLIC PANEL
BUN: 23 mg/dL (ref 6–23)
Calcium: 9.6 mg/dL (ref 8.4–10.5)
Creat: 1.32 mg/dL — ABNORMAL HIGH (ref 0.50–1.10)
Glucose, Bld: 99 mg/dL (ref 70–99)

## 2012-02-02 LAB — LIPID PANEL
Cholesterol: 171 mg/dL (ref 0–200)
Total CHOL/HDL Ratio: 5.3 Ratio

## 2012-02-02 LAB — TSH: TSH: 1.244 u[IU]/mL (ref 0.350–4.500)

## 2012-02-02 NOTE — Telephone Encounter (Signed)
Patient has appointment 3.19.13

## 2012-02-04 ENCOUNTER — Encounter: Payer: Self-pay | Admitting: Family Medicine

## 2012-02-04 ENCOUNTER — Other Ambulatory Visit: Payer: Self-pay | Admitting: Family Medicine

## 2012-02-04 ENCOUNTER — Ambulatory Visit (INDEPENDENT_AMBULATORY_CARE_PROVIDER_SITE_OTHER): Payer: Medicare Other | Admitting: Family Medicine

## 2012-02-04 VITALS — BP 130/82 | HR 69 | Resp 16 | Ht 65.0 in | Wt 210.1 lb

## 2012-02-04 DIAGNOSIS — E669 Obesity, unspecified: Secondary | ICD-10-CM

## 2012-02-04 DIAGNOSIS — I10 Essential (primary) hypertension: Secondary | ICD-10-CM | POA: Diagnosis not present

## 2012-02-04 DIAGNOSIS — E785 Hyperlipidemia, unspecified: Secondary | ICD-10-CM

## 2012-02-04 DIAGNOSIS — E039 Hypothyroidism, unspecified: Secondary | ICD-10-CM | POA: Diagnosis not present

## 2012-02-04 DIAGNOSIS — F411 Generalized anxiety disorder: Secondary | ICD-10-CM

## 2012-02-04 DIAGNOSIS — L98499 Non-pressure chronic ulcer of skin of other sites with unspecified severity: Secondary | ICD-10-CM | POA: Diagnosis not present

## 2012-02-04 DIAGNOSIS — M549 Dorsalgia, unspecified: Secondary | ICD-10-CM

## 2012-02-04 DIAGNOSIS — E119 Type 2 diabetes mellitus without complications: Secondary | ICD-10-CM

## 2012-02-04 DIAGNOSIS — IMO0002 Reserved for concepts with insufficient information to code with codable children: Secondary | ICD-10-CM

## 2012-02-04 MED ORDER — DOXYCYCLINE HYCLATE 100 MG PO TABS: 100.0000 mg | ORAL_TABLET | Freq: Two times a day (BID) | ORAL | Status: AC

## 2012-02-04 MED ORDER — KETOROLAC TROMETHAMINE 60 MG/2ML IJ SOLN
60.0000 mg | Freq: Once | INTRAMUSCULAR | Status: AC
  Administered 2012-02-04: 60 mg via INTRAMUSCULAR

## 2012-02-04 NOTE — Assessment & Plan Note (Signed)
Unchanged, continue current medication, increased stress due to ill health of daughter

## 2012-02-04 NOTE — Assessment & Plan Note (Signed)
Unchanged, no change in medication, toradol administered, continue medication

## 2012-02-04 NOTE — Progress Notes (Signed)
  Subjective:    Patient ID: Tammy Suarez, female    DOB: 18-Apr-1946, 66 y.o.   MRN: [id number]  HPI The PT is here for follow up and re-evaluation of chronic medical conditions, medication management and review of any available recent lab and radiology data.  Preventive health is updated, specifically  Cancer screening and Immunization.   Questions or concerns regarding consultations or procedures which the PT has had in the interim are  addressed. The PT denies any adverse reactions to current medications since the last visit.  2 week h/o abrasion to right l;ower abdomen, no fever , chills, or purulent drainage. She has had  a similar problem on the left side, and has managed with topical antibiotics . C/o uncontrolled back pain and requests anti inflammatory injection       Review of Systems See HPI Denies recent fever or chills. Denies sinus pressure, nasal congestion, ear pain or sore throat. Denies chest congestion, productive cough or wheezing. Denies chest pains, palpitations and leg swelling Denies abdominal pain, nausea, vomiting,diarrhea or constipation.   Denies dysuria, frequency, hesitancy or incontinence. Denies headaches, seizures, numbness, or tingling. Denies uncontrolled  depression, anxiety or insomnia.       Objective:   Physical Exam Patient alert and oriented and in no cardiopulmonary distress.  HEENT: No facial asymmetry, EOMI, no sinus tenderness,  oropharynx pink and moist.  Neck supple no adenopathy.  Chest: Clear to auscultation bilaterally.Decreased air entry throughout   CVS: S1, S2 no murmurs, no S3.  ABD: Soft non tender. Bowel sounds normal.  Ext: No edema  MS: decreased  ROM spine, shoulders, hips and knees.  Skin:Ulceration and erythema of right lower anterior abdomen  Psych: Good eye contact, normal affect. Memory intact not anxious or depressed appearing.  CNS: CN 2-12 intact, power, tone and sensation normal  throughout.        Assessment & Plan:

## 2012-02-04 NOTE — Assessment & Plan Note (Signed)
Controlled, no change in medication  

## 2012-02-04 NOTE — Assessment & Plan Note (Signed)
Unchanged , low fat diet discussed and encouraged 

## 2012-02-04 NOTE — Patient Instructions (Addendum)
F/u in 4.5 month  Call if you need to be seen before  You will get toradol in the office for back pain.  Please keep wound clean and dry, and cover with dry gauze loosely for protection. Antibiotics by mouth are prescribed for 10 days.  If the ulcer is not totally healed in 2 weeks, or is worsening please call and come back in.  Please change food choices so that you lose the 7 pounds that you gained.  Cut back by one cigarette each month please, you need to quit  HBA1C, CBC and TSH in 4.5 month non fasting

## 2012-02-04 NOTE — Assessment & Plan Note (Signed)
Deteriorated. Patient re-educated about  the importance of commitment to a  minimum of 150 minutes of exercise per week. The importance of healthy food choices with portion control discussed. Encouraged to start a food diary, count calories and to consider  joining a support group. Sample diet sheets offered. Goals set by the patient for the next several months.    

## 2012-02-06 NOTE — Assessment & Plan Note (Signed)
Ulcers x 2 on lower abdominal wall, antibiotic prescribed , and pt advised to keep area clean and dry

## 2012-02-15 ENCOUNTER — Other Ambulatory Visit: Payer: Self-pay | Admitting: Family Medicine

## 2012-02-19 ENCOUNTER — Other Ambulatory Visit: Payer: Self-pay | Admitting: Family Medicine

## 2012-02-22 ENCOUNTER — Telehealth: Payer: Self-pay | Admitting: Family Medicine

## 2012-02-22 MED ORDER — HYDROCODONE-ACETAMINOPHEN 10-500 MG PO TABS: 1.0000 | ORAL_TABLET | Freq: Four times a day (QID) | ORAL | Status: DC

## 2012-02-22 NOTE — Telephone Encounter (Signed)
Refill sent in

## 2012-03-01 ENCOUNTER — Other Ambulatory Visit: Payer: Self-pay | Admitting: Family Medicine

## 2012-03-02 ENCOUNTER — Encounter: Payer: Self-pay | Admitting: Family Medicine

## 2012-03-02 ENCOUNTER — Ambulatory Visit (INDEPENDENT_AMBULATORY_CARE_PROVIDER_SITE_OTHER): Payer: Medicare Other | Admitting: Family Medicine

## 2012-03-02 ENCOUNTER — Telehealth: Payer: Self-pay | Admitting: Family Medicine

## 2012-03-02 VITALS — BP 152/74 | HR 83 | Resp 18 | Ht 65.0 in | Wt 210.0 lb

## 2012-03-02 DIAGNOSIS — E119 Type 2 diabetes mellitus without complications: Secondary | ICD-10-CM | POA: Diagnosis not present

## 2012-03-02 DIAGNOSIS — L98499 Non-pressure chronic ulcer of skin of other sites with unspecified severity: Secondary | ICD-10-CM | POA: Diagnosis not present

## 2012-03-02 DIAGNOSIS — I10 Essential (primary) hypertension: Secondary | ICD-10-CM

## 2012-03-02 DIAGNOSIS — IMO0002 Reserved for concepts with insufficient information to code with codable children: Secondary | ICD-10-CM

## 2012-03-02 MED ORDER — SULFAMETHOXAZOLE-TRIMETHOPRIM 800-160 MG PO TABS: 1.0000 | ORAL_TABLET | Freq: Two times a day (BID) | ORAL | Status: AC

## 2012-03-02 NOTE — Telephone Encounter (Signed)
Said you told her to call back if no better

## 2012-03-02 NOTE — Telephone Encounter (Signed)
Pt seen

## 2012-03-02 NOTE — Progress Notes (Signed)
  Subjective:    Patient ID: Tammy Suarez, female    DOB: [birth year], 66 y.o.   MRN: [id number]  HPI 6 to 8 wweek h/o right lower quadrant open wound, healed then with minimal activity reopened. Denies fever or chills, denies excessive tenderness or drainage, however pt is concerned and she is a diet controlled diabetic   Review of Systems See HPI Denies recent fever or chills. Denies sinus pressure, nasal congestion, ear pain or sore throat. Denies chest congestion, productive cough or wheezing. Denies chest pains, palpitations and leg swelling Denies abdominal pain, nausea, vomiting,diarrhea or constipation.   Denies dysuria, frequency, hesitancy or incontinence. Chronic back  jpain Denies headaches, seizures, numbness, or tingling. Denies uncontrolled  depression, anxiety or insomnia.        Objective:   Physical Exam Patient alert and oriented and in no cardiopulmonary distress.  HEENT: No facial asymmetry, EOMI, no sinus tenderness,  oropharynx pink and moist.  Neck supple no adenopathy.  Chest: Clear to auscultation bilaterally.  CVS: S1, S2 no murmurs, no S3.  ABD: Soft non tender. Bowel sounds normal.  Ext: No edema  MS: Adequate though reduced  ROM spine, shoulders, hips and knees.  Skin: Ulcer on right lower abdominal fold, no underlying collection palpated, diameter approx 5 cm  Psych: Good eye contact, normal affect. Memory intact not anxious or depressed appearing.  CNS: CN 2-12 intact, power, tone and sensation normal throughout.        Assessment & Plan:

## 2012-03-02 NOTE — Patient Instructions (Signed)
F/u as before.  Antibiotic septra is sent in for management of the ulcer on your lower abdomen.  You are referred to wound center in [place] also, please ensure you keep appt , we are attemptiing to get one as soon as possible

## 2012-03-06 NOTE — Assessment & Plan Note (Signed)
Uncontrolled at this visit, pt has not had medication today

## 2012-03-06 NOTE — Assessment & Plan Note (Addendum)
Controlled,on no  medication

## 2012-03-06 NOTE — Assessment & Plan Note (Signed)
Recurrent skin breakdown along lower abdominal fold, refer to wound center

## 2012-03-07 DIAGNOSIS — I10 Essential (primary) hypertension: Secondary | ICD-10-CM | POA: Diagnosis not present

## 2012-03-07 DIAGNOSIS — L89899 Pressure ulcer of other site, unspecified stage: Secondary | ICD-10-CM | POA: Diagnosis not present

## 2012-03-07 DIAGNOSIS — R233 Spontaneous ecchymoses: Secondary | ICD-10-CM | POA: Diagnosis not present

## 2012-03-07 DIAGNOSIS — M1990 Unspecified osteoarthritis, unspecified site: Secondary | ICD-10-CM | POA: Diagnosis not present

## 2012-03-07 DIAGNOSIS — Z9884 Bariatric surgery status: Secondary | ICD-10-CM | POA: Diagnosis not present

## 2012-03-07 DIAGNOSIS — Z7982 Long term (current) use of aspirin: Secondary | ICD-10-CM | POA: Diagnosis not present

## 2012-03-07 DIAGNOSIS — Z79899 Other long term (current) drug therapy: Secondary | ICD-10-CM | POA: Diagnosis not present

## 2012-03-07 DIAGNOSIS — L8993 Pressure ulcer of unspecified site, stage 3: Secondary | ICD-10-CM | POA: Diagnosis not present

## 2012-03-07 DIAGNOSIS — E785 Hyperlipidemia, unspecified: Secondary | ICD-10-CM | POA: Diagnosis not present

## 2012-03-07 DIAGNOSIS — F411 Generalized anxiety disorder: Secondary | ICD-10-CM | POA: Diagnosis not present

## 2012-03-07 DIAGNOSIS — Z886 Allergy status to analgesic agent status: Secondary | ICD-10-CM | POA: Diagnosis not present

## 2012-03-07 DIAGNOSIS — E669 Obesity, unspecified: Secondary | ICD-10-CM | POA: Diagnosis not present

## 2012-03-07 DIAGNOSIS — L8990 Pressure ulcer of unspecified site, unspecified stage: Secondary | ICD-10-CM | POA: Diagnosis not present

## 2012-03-07 DIAGNOSIS — E65 Localized adiposity: Secondary | ICD-10-CM | POA: Diagnosis not present

## 2012-03-07 DIAGNOSIS — F17200 Nicotine dependence, unspecified, uncomplicated: Secondary | ICD-10-CM | POA: Diagnosis not present

## 2012-03-07 DIAGNOSIS — E039 Hypothyroidism, unspecified: Secondary | ICD-10-CM | POA: Diagnosis not present

## 2012-03-09 ENCOUNTER — Ambulatory Visit: Payer: Medicare Other | Admitting: Family Medicine

## 2012-03-09 ENCOUNTER — Other Ambulatory Visit: Payer: Self-pay | Admitting: Family Medicine

## 2012-03-14 DIAGNOSIS — E65 Localized adiposity: Secondary | ICD-10-CM | POA: Diagnosis not present

## 2012-03-14 DIAGNOSIS — L8990 Pressure ulcer of unspecified site, unspecified stage: Secondary | ICD-10-CM | POA: Diagnosis not present

## 2012-03-14 DIAGNOSIS — L89899 Pressure ulcer of other site, unspecified stage: Secondary | ICD-10-CM | POA: Diagnosis not present

## 2012-03-14 DIAGNOSIS — S2020XA Contusion of thorax, unspecified, initial encounter: Secondary | ICD-10-CM | POA: Diagnosis not present

## 2012-03-14 DIAGNOSIS — R233 Spontaneous ecchymoses: Secondary | ICD-10-CM | POA: Diagnosis not present

## 2012-03-14 DIAGNOSIS — E785 Hyperlipidemia, unspecified: Secondary | ICD-10-CM | POA: Diagnosis not present

## 2012-03-14 DIAGNOSIS — I10 Essential (primary) hypertension: Secondary | ICD-10-CM | POA: Diagnosis not present

## 2012-03-15 ENCOUNTER — Other Ambulatory Visit: Payer: Self-pay | Admitting: Family Medicine

## 2012-03-15 DIAGNOSIS — IMO0002 Reserved for concepts with insufficient information to code with codable children: Secondary | ICD-10-CM | POA: Diagnosis not present

## 2012-03-15 DIAGNOSIS — E119 Type 2 diabetes mellitus without complications: Secondary | ICD-10-CM | POA: Diagnosis not present

## 2012-03-18 ENCOUNTER — Other Ambulatory Visit: Payer: Self-pay | Admitting: Family Medicine

## 2012-03-18 ENCOUNTER — Telehealth: Payer: Self-pay | Admitting: Family Medicine

## 2012-03-18 DIAGNOSIS — L98499 Non-pressure chronic ulcer of skin of other sites with unspecified severity: Secondary | ICD-10-CM

## 2012-03-18 NOTE — Telephone Encounter (Signed)
pls refer to [facility] bariatric surgical clinic for evaluation and management of liquified hematoma and fatty necrosis of ulcerative lesion of RLQ abdomen [facility]. The notes are in your area

## 2012-03-21 DIAGNOSIS — R5381 Other malaise: Secondary | ICD-10-CM | POA: Diagnosis not present

## 2012-03-21 DIAGNOSIS — M793 Panniculitis, unspecified: Secondary | ICD-10-CM | POA: Diagnosis not present

## 2012-03-21 DIAGNOSIS — B9689 Other specified bacterial agents as the cause of diseases classified elsewhere: Secondary | ICD-10-CM | POA: Diagnosis not present

## 2012-03-21 DIAGNOSIS — F17200 Nicotine dependence, unspecified, uncomplicated: Secondary | ICD-10-CM | POA: Diagnosis not present

## 2012-03-21 DIAGNOSIS — L02219 Cutaneous abscess of trunk, unspecified: Secondary | ICD-10-CM | POA: Diagnosis not present

## 2012-03-21 DIAGNOSIS — L03319 Cellulitis of trunk, unspecified: Secondary | ICD-10-CM | POA: Diagnosis not present

## 2012-03-21 DIAGNOSIS — B958 Unspecified staphylococcus as the cause of diseases classified elsewhere: Secondary | ICD-10-CM | POA: Diagnosis not present

## 2012-03-21 DIAGNOSIS — L98499 Non-pressure chronic ulcer of skin of other sites with unspecified severity: Secondary | ICD-10-CM | POA: Diagnosis not present

## 2012-03-21 DIAGNOSIS — R5383 Other fatigue: Secondary | ICD-10-CM | POA: Diagnosis not present

## 2012-03-21 DIAGNOSIS — Z9884 Bariatric surgery status: Secondary | ICD-10-CM | POA: Diagnosis not present

## 2012-03-22 ENCOUNTER — Telehealth: Payer: Self-pay | Admitting: Family Medicine

## 2012-03-22 DIAGNOSIS — F419 Anxiety disorder, unspecified: Secondary | ICD-10-CM

## 2012-03-23 MED ORDER — CLONAZEPAM 1 MG PO TABS: 1.0000 mg | ORAL_TABLET | Freq: Three times a day (TID) | ORAL | Status: DC

## 2012-03-23 NOTE — Telephone Encounter (Signed)
Sent in

## 2012-03-24 DIAGNOSIS — E65 Localized adiposity: Secondary | ICD-10-CM | POA: Diagnosis not present

## 2012-03-24 NOTE — Telephone Encounter (Signed)
noted 

## 2012-03-24 NOTE — Telephone Encounter (Signed)
Patient made a appointment at [facility]

## 2012-03-28 DIAGNOSIS — R5381 Other malaise: Secondary | ICD-10-CM | POA: Diagnosis not present

## 2012-03-28 DIAGNOSIS — Z9884 Bariatric surgery status: Secondary | ICD-10-CM | POA: Diagnosis not present

## 2012-03-28 DIAGNOSIS — R5383 Other fatigue: Secondary | ICD-10-CM | POA: Diagnosis not present

## 2012-03-28 DIAGNOSIS — L98499 Non-pressure chronic ulcer of skin of other sites with unspecified severity: Secondary | ICD-10-CM | POA: Diagnosis not present

## 2012-03-28 DIAGNOSIS — B9689 Other specified bacterial agents as the cause of diseases classified elsewhere: Secondary | ICD-10-CM | POA: Diagnosis not present

## 2012-03-28 DIAGNOSIS — M793 Panniculitis, unspecified: Secondary | ICD-10-CM | POA: Diagnosis not present

## 2012-03-28 DIAGNOSIS — F17200 Nicotine dependence, unspecified, uncomplicated: Secondary | ICD-10-CM | POA: Diagnosis not present

## 2012-04-04 DIAGNOSIS — L98499 Non-pressure chronic ulcer of skin of other sites with unspecified severity: Secondary | ICD-10-CM | POA: Diagnosis not present

## 2012-04-04 DIAGNOSIS — F17200 Nicotine dependence, unspecified, uncomplicated: Secondary | ICD-10-CM | POA: Diagnosis not present

## 2012-04-04 DIAGNOSIS — M793 Panniculitis, unspecified: Secondary | ICD-10-CM | POA: Diagnosis not present

## 2012-04-04 DIAGNOSIS — Z9884 Bariatric surgery status: Secondary | ICD-10-CM | POA: Diagnosis not present

## 2012-04-04 DIAGNOSIS — B9689 Other specified bacterial agents as the cause of diseases classified elsewhere: Secondary | ICD-10-CM | POA: Diagnosis not present

## 2012-04-04 DIAGNOSIS — R5383 Other fatigue: Secondary | ICD-10-CM | POA: Diagnosis not present

## 2012-04-12 DIAGNOSIS — E119 Type 2 diabetes mellitus without complications: Secondary | ICD-10-CM | POA: Diagnosis not present

## 2012-04-14 ENCOUNTER — Other Ambulatory Visit: Payer: Self-pay | Admitting: Family Medicine

## 2012-04-15 ENCOUNTER — Other Ambulatory Visit: Payer: Self-pay

## 2012-04-15 DIAGNOSIS — Z9884 Bariatric surgery status: Secondary | ICD-10-CM | POA: Diagnosis not present

## 2012-04-15 DIAGNOSIS — B9689 Other specified bacterial agents as the cause of diseases classified elsewhere: Secondary | ICD-10-CM | POA: Diagnosis not present

## 2012-04-15 DIAGNOSIS — L98499 Non-pressure chronic ulcer of skin of other sites with unspecified severity: Secondary | ICD-10-CM | POA: Diagnosis not present

## 2012-04-15 DIAGNOSIS — M793 Panniculitis, unspecified: Secondary | ICD-10-CM | POA: Diagnosis not present

## 2012-04-15 DIAGNOSIS — R5381 Other malaise: Secondary | ICD-10-CM | POA: Diagnosis not present

## 2012-04-15 DIAGNOSIS — F17200 Nicotine dependence, unspecified, uncomplicated: Secondary | ICD-10-CM | POA: Diagnosis not present

## 2012-04-15 DIAGNOSIS — R5383 Other fatigue: Secondary | ICD-10-CM | POA: Diagnosis not present

## 2012-04-25 DIAGNOSIS — Z87891 Personal history of nicotine dependence: Secondary | ICD-10-CM | POA: Diagnosis not present

## 2012-04-25 DIAGNOSIS — L02219 Cutaneous abscess of trunk, unspecified: Secondary | ICD-10-CM | POA: Diagnosis not present

## 2012-04-25 DIAGNOSIS — M793 Panniculitis, unspecified: Secondary | ICD-10-CM | POA: Diagnosis not present

## 2012-04-25 DIAGNOSIS — L03319 Cellulitis of trunk, unspecified: Secondary | ICD-10-CM | POA: Diagnosis not present

## 2012-05-11 ENCOUNTER — Other Ambulatory Visit: Payer: Self-pay | Admitting: Family Medicine

## 2012-05-12 ENCOUNTER — Other Ambulatory Visit: Payer: Self-pay

## 2012-05-14 ENCOUNTER — Other Ambulatory Visit: Payer: Self-pay | Admitting: Family Medicine

## 2012-05-14 DIAGNOSIS — E119 Type 2 diabetes mellitus without complications: Secondary | ICD-10-CM | POA: Diagnosis not present

## 2012-05-14 DIAGNOSIS — IMO0002 Reserved for concepts with insufficient information to code with codable children: Secondary | ICD-10-CM | POA: Diagnosis not present

## 2012-05-16 ENCOUNTER — Other Ambulatory Visit: Payer: Self-pay

## 2012-05-23 DIAGNOSIS — L98499 Non-pressure chronic ulcer of skin of other sites with unspecified severity: Secondary | ICD-10-CM | POA: Diagnosis not present

## 2012-05-23 DIAGNOSIS — M793 Panniculitis, unspecified: Secondary | ICD-10-CM | POA: Diagnosis not present

## 2012-05-23 DIAGNOSIS — Z87891 Personal history of nicotine dependence: Secondary | ICD-10-CM | POA: Diagnosis not present

## 2012-05-26 DIAGNOSIS — M793 Panniculitis, unspecified: Secondary | ICD-10-CM | POA: Diagnosis not present

## 2012-05-31 ENCOUNTER — Telehealth: Payer: Self-pay | Admitting: Family Medicine

## 2012-06-01 DIAGNOSIS — E119 Type 2 diabetes mellitus without complications: Secondary | ICD-10-CM | POA: Diagnosis not present

## 2012-06-01 DIAGNOSIS — IMO0002 Reserved for concepts with insufficient information to code with codable children: Secondary | ICD-10-CM | POA: Diagnosis not present

## 2012-06-01 NOTE — Telephone Encounter (Signed)
[facility] and [facility] care orders.

## 2012-06-02 DIAGNOSIS — E119 Type 2 diabetes mellitus without complications: Secondary | ICD-10-CM | POA: Diagnosis not present

## 2012-06-08 DIAGNOSIS — IMO0002 Reserved for concepts with insufficient information to code with codable children: Secondary | ICD-10-CM | POA: Diagnosis not present

## 2012-06-08 DIAGNOSIS — E119 Type 2 diabetes mellitus without complications: Secondary | ICD-10-CM | POA: Diagnosis not present

## 2012-06-09 ENCOUNTER — Other Ambulatory Visit: Payer: Self-pay | Admitting: Family Medicine

## 2012-06-09 ENCOUNTER — Other Ambulatory Visit: Payer: Self-pay

## 2012-06-09 DIAGNOSIS — E039 Hypothyroidism, unspecified: Secondary | ICD-10-CM

## 2012-06-09 DIAGNOSIS — E119 Type 2 diabetes mellitus without complications: Secondary | ICD-10-CM | POA: Diagnosis not present

## 2012-06-09 DIAGNOSIS — Z6824 Body mass index (BMI) 24.0-24.9, adult: Secondary | ICD-10-CM

## 2012-06-09 LAB — CBC
HCT: 35 % — ABNORMAL LOW (ref 36.0–46.0)
MCV: 95.4 fL (ref 78.0–100.0)
RBC: 3.67 MIL/uL — ABNORMAL LOW (ref 3.87–5.11)
WBC: 8.7 10*3/uL (ref 4.0–10.5)

## 2012-06-09 LAB — HEMOGLOBIN A1C
Hgb A1c MFr Bld: 5.6 % (ref <5.7)
Mean Plasma Glucose: 114 mg/dL (ref <117)

## 2012-06-11 ENCOUNTER — Other Ambulatory Visit: Payer: Self-pay | Admitting: Family Medicine

## 2012-06-12 ENCOUNTER — Other Ambulatory Visit: Payer: Self-pay | Admitting: Family Medicine

## 2012-06-16 DIAGNOSIS — Z79899 Other long term (current) drug therapy: Secondary | ICD-10-CM | POA: Diagnosis not present

## 2012-06-16 DIAGNOSIS — F3289 Other specified depressive episodes: Secondary | ICD-10-CM | POA: Diagnosis not present

## 2012-06-16 DIAGNOSIS — E669 Obesity, unspecified: Secondary | ICD-10-CM | POA: Diagnosis not present

## 2012-06-16 DIAGNOSIS — E210 Primary hyperparathyroidism: Secondary | ICD-10-CM | POA: Diagnosis not present

## 2012-06-16 DIAGNOSIS — E785 Hyperlipidemia, unspecified: Secondary | ICD-10-CM | POA: Diagnosis not present

## 2012-06-16 DIAGNOSIS — E039 Hypothyroidism, unspecified: Secondary | ICD-10-CM | POA: Diagnosis not present

## 2012-06-16 DIAGNOSIS — K439 Ventral hernia without obstruction or gangrene: Secondary | ICD-10-CM | POA: Diagnosis not present

## 2012-06-16 DIAGNOSIS — I10 Essential (primary) hypertension: Secondary | ICD-10-CM | POA: Diagnosis not present

## 2012-06-16 DIAGNOSIS — E65 Localized adiposity: Secondary | ICD-10-CM | POA: Diagnosis not present

## 2012-06-16 DIAGNOSIS — M542 Cervicalgia: Secondary | ICD-10-CM | POA: Diagnosis not present

## 2012-06-16 DIAGNOSIS — F411 Generalized anxiety disorder: Secondary | ICD-10-CM | POA: Diagnosis not present

## 2012-06-16 DIAGNOSIS — Z885 Allergy status to narcotic agent status: Secondary | ICD-10-CM | POA: Diagnosis not present

## 2012-06-16 DIAGNOSIS — M545 Low back pain, unspecified: Secondary | ICD-10-CM | POA: Diagnosis not present

## 2012-06-16 DIAGNOSIS — E119 Type 2 diabetes mellitus without complications: Secondary | ICD-10-CM | POA: Diagnosis not present

## 2012-06-16 DIAGNOSIS — Z9884 Bariatric surgery status: Secondary | ICD-10-CM | POA: Diagnosis not present

## 2012-06-16 DIAGNOSIS — K219 Gastro-esophageal reflux disease without esophagitis: Secondary | ICD-10-CM | POA: Diagnosis not present

## 2012-06-16 DIAGNOSIS — F329 Major depressive disorder, single episode, unspecified: Secondary | ICD-10-CM | POA: Diagnosis not present

## 2012-06-16 DIAGNOSIS — Z6835 Body mass index (BMI) 35.0-35.9, adult: Secondary | ICD-10-CM | POA: Diagnosis not present

## 2012-06-23 DIAGNOSIS — IMO0002 Reserved for concepts with insufficient information to code with codable children: Secondary | ICD-10-CM | POA: Diagnosis not present

## 2012-06-23 DIAGNOSIS — E119 Type 2 diabetes mellitus without complications: Secondary | ICD-10-CM | POA: Diagnosis not present

## 2012-06-27 DIAGNOSIS — Z87891 Personal history of nicotine dependence: Secondary | ICD-10-CM | POA: Diagnosis not present

## 2012-06-27 DIAGNOSIS — F341 Dysthymic disorder: Secondary | ICD-10-CM | POA: Diagnosis not present

## 2012-06-27 DIAGNOSIS — Z79899 Other long term (current) drug therapy: Secondary | ICD-10-CM | POA: Diagnosis not present

## 2012-06-27 DIAGNOSIS — N189 Chronic kidney disease, unspecified: Secondary | ICD-10-CM | POA: Diagnosis not present

## 2012-06-27 DIAGNOSIS — N184 Chronic kidney disease, stage 4 (severe): Secondary | ICD-10-CM | POA: Diagnosis not present

## 2012-06-27 DIAGNOSIS — I129 Hypertensive chronic kidney disease with stage 1 through stage 4 chronic kidney disease, or unspecified chronic kidney disease: Secondary | ICD-10-CM | POA: Diagnosis not present

## 2012-06-27 DIAGNOSIS — M542 Cervicalgia: Secondary | ICD-10-CM | POA: Diagnosis not present

## 2012-06-27 DIAGNOSIS — N039 Chronic nephritic syndrome with unspecified morphologic changes: Secondary | ICD-10-CM | POA: Diagnosis not present

## 2012-06-27 DIAGNOSIS — D631 Anemia in chronic kidney disease: Secondary | ICD-10-CM | POA: Diagnosis not present

## 2012-06-27 DIAGNOSIS — E785 Hyperlipidemia, unspecified: Secondary | ICD-10-CM | POA: Diagnosis not present

## 2012-06-27 DIAGNOSIS — Z7982 Long term (current) use of aspirin: Secondary | ICD-10-CM | POA: Diagnosis not present

## 2012-06-29 ENCOUNTER — Other Ambulatory Visit: Payer: Self-pay | Admitting: Family Medicine

## 2012-06-29 DIAGNOSIS — N19 Unspecified kidney failure: Secondary | ICD-10-CM | POA: Diagnosis not present

## 2012-07-01 DIAGNOSIS — F329 Major depressive disorder, single episode, unspecified: Secondary | ICD-10-CM | POA: Diagnosis not present

## 2012-07-01 DIAGNOSIS — E039 Hypothyroidism, unspecified: Secondary | ICD-10-CM | POA: Diagnosis not present

## 2012-07-01 DIAGNOSIS — M793 Panniculitis, unspecified: Secondary | ICD-10-CM | POA: Diagnosis not present

## 2012-07-01 DIAGNOSIS — K219 Gastro-esophageal reflux disease without esophagitis: Secondary | ICD-10-CM | POA: Diagnosis not present

## 2012-07-01 DIAGNOSIS — K439 Ventral hernia without obstruction or gangrene: Secondary | ICD-10-CM | POA: Diagnosis not present

## 2012-07-01 DIAGNOSIS — I10 Essential (primary) hypertension: Secondary | ICD-10-CM | POA: Diagnosis not present

## 2012-07-01 DIAGNOSIS — F3289 Other specified depressive episodes: Secondary | ICD-10-CM | POA: Diagnosis not present

## 2012-07-01 DIAGNOSIS — E65 Localized adiposity: Secondary | ICD-10-CM | POA: Diagnosis not present

## 2012-07-01 HISTORY — PX: COSMETIC SURGERY: SHX468

## 2012-07-02 DIAGNOSIS — E65 Localized adiposity: Secondary | ICD-10-CM | POA: Diagnosis not present

## 2012-07-02 DIAGNOSIS — K439 Ventral hernia without obstruction or gangrene: Secondary | ICD-10-CM | POA: Diagnosis not present

## 2012-07-02 DIAGNOSIS — I10 Essential (primary) hypertension: Secondary | ICD-10-CM | POA: Diagnosis not present

## 2012-07-02 DIAGNOSIS — E039 Hypothyroidism, unspecified: Secondary | ICD-10-CM | POA: Diagnosis not present

## 2012-07-02 DIAGNOSIS — F329 Major depressive disorder, single episode, unspecified: Secondary | ICD-10-CM | POA: Diagnosis not present

## 2012-07-02 DIAGNOSIS — F3289 Other specified depressive episodes: Secondary | ICD-10-CM | POA: Diagnosis not present

## 2012-07-02 DIAGNOSIS — K219 Gastro-esophageal reflux disease without esophagitis: Secondary | ICD-10-CM | POA: Diagnosis not present

## 2012-07-03 DIAGNOSIS — K219 Gastro-esophageal reflux disease without esophagitis: Secondary | ICD-10-CM | POA: Diagnosis not present

## 2012-07-03 DIAGNOSIS — I10 Essential (primary) hypertension: Secondary | ICD-10-CM | POA: Diagnosis not present

## 2012-07-03 DIAGNOSIS — E65 Localized adiposity: Secondary | ICD-10-CM | POA: Diagnosis not present

## 2012-07-03 DIAGNOSIS — E039 Hypothyroidism, unspecified: Secondary | ICD-10-CM | POA: Diagnosis not present

## 2012-07-03 DIAGNOSIS — F329 Major depressive disorder, single episode, unspecified: Secondary | ICD-10-CM | POA: Diagnosis not present

## 2012-07-03 DIAGNOSIS — F3289 Other specified depressive episodes: Secondary | ICD-10-CM | POA: Diagnosis not present

## 2012-07-03 DIAGNOSIS — K439 Ventral hernia without obstruction or gangrene: Secondary | ICD-10-CM | POA: Diagnosis not present

## 2012-07-04 DIAGNOSIS — I10 Essential (primary) hypertension: Secondary | ICD-10-CM | POA: Diagnosis not present

## 2012-07-04 DIAGNOSIS — E65 Localized adiposity: Secondary | ICD-10-CM | POA: Diagnosis not present

## 2012-07-04 DIAGNOSIS — K439 Ventral hernia without obstruction or gangrene: Secondary | ICD-10-CM | POA: Diagnosis not present

## 2012-07-04 DIAGNOSIS — K219 Gastro-esophageal reflux disease without esophagitis: Secondary | ICD-10-CM | POA: Diagnosis not present

## 2012-07-04 DIAGNOSIS — F329 Major depressive disorder, single episode, unspecified: Secondary | ICD-10-CM | POA: Diagnosis not present

## 2012-07-04 DIAGNOSIS — E039 Hypothyroidism, unspecified: Secondary | ICD-10-CM | POA: Diagnosis not present

## 2012-07-04 DIAGNOSIS — F3289 Other specified depressive episodes: Secondary | ICD-10-CM | POA: Diagnosis not present

## 2012-07-05 DIAGNOSIS — E119 Type 2 diabetes mellitus without complications: Secondary | ICD-10-CM | POA: Diagnosis not present

## 2012-07-05 DIAGNOSIS — IMO0002 Reserved for concepts with insufficient information to code with codable children: Secondary | ICD-10-CM | POA: Diagnosis not present

## 2012-07-06 ENCOUNTER — Ambulatory Visit: Payer: Medicare Other | Admitting: Family Medicine

## 2012-07-07 DIAGNOSIS — E119 Type 2 diabetes mellitus without complications: Secondary | ICD-10-CM | POA: Diagnosis not present

## 2012-07-07 DIAGNOSIS — IMO0002 Reserved for concepts with insufficient information to code with codable children: Secondary | ICD-10-CM | POA: Diagnosis not present

## 2012-07-11 DIAGNOSIS — E119 Type 2 diabetes mellitus without complications: Secondary | ICD-10-CM | POA: Diagnosis not present

## 2012-07-11 DIAGNOSIS — IMO0002 Reserved for concepts with insufficient information to code with codable children: Secondary | ICD-10-CM | POA: Diagnosis not present

## 2012-07-13 DIAGNOSIS — E119 Type 2 diabetes mellitus without complications: Secondary | ICD-10-CM | POA: Diagnosis not present

## 2012-07-13 DIAGNOSIS — Z4889 Encounter for other specified surgical aftercare: Secondary | ICD-10-CM | POA: Diagnosis not present

## 2012-07-14 DIAGNOSIS — Z4889 Encounter for other specified surgical aftercare: Secondary | ICD-10-CM | POA: Diagnosis not present

## 2012-07-14 DIAGNOSIS — E119 Type 2 diabetes mellitus without complications: Secondary | ICD-10-CM | POA: Diagnosis not present

## 2012-07-17 ENCOUNTER — Other Ambulatory Visit: Payer: Self-pay | Admitting: Family Medicine

## 2012-07-18 DIAGNOSIS — Z4889 Encounter for other specified surgical aftercare: Secondary | ICD-10-CM | POA: Diagnosis not present

## 2012-07-18 DIAGNOSIS — E119 Type 2 diabetes mellitus without complications: Secondary | ICD-10-CM | POA: Diagnosis not present

## 2012-07-21 DIAGNOSIS — E119 Type 2 diabetes mellitus without complications: Secondary | ICD-10-CM | POA: Diagnosis not present

## 2012-07-21 DIAGNOSIS — Z4889 Encounter for other specified surgical aftercare: Secondary | ICD-10-CM | POA: Diagnosis not present

## 2012-07-22 ENCOUNTER — Other Ambulatory Visit: Payer: Self-pay | Admitting: Family Medicine

## 2012-07-22 DIAGNOSIS — Z139 Encounter for screening, unspecified: Secondary | ICD-10-CM

## 2012-07-29 ENCOUNTER — Other Ambulatory Visit: Payer: Self-pay | Admitting: Family Medicine

## 2012-07-29 DIAGNOSIS — Z4889 Encounter for other specified surgical aftercare: Secondary | ICD-10-CM | POA: Diagnosis not present

## 2012-07-29 DIAGNOSIS — E119 Type 2 diabetes mellitus without complications: Secondary | ICD-10-CM | POA: Diagnosis not present

## 2012-08-02 ENCOUNTER — Ambulatory Visit (HOSPITAL_COMMUNITY)
Admission: RE | Admit: 2012-08-02 | Discharge: 2012-08-02 | Disposition: A | Discharge status: Home / Self Care | Payer: Medicare Other | Source: Ambulatory Visit | Attending: Family Medicine | Admitting: Family Medicine

## 2012-08-02 DIAGNOSIS — Z139 Encounter for screening, unspecified: Secondary | ICD-10-CM

## 2012-08-02 DIAGNOSIS — Z1231 Encounter for screening mammogram for malignant neoplasm of breast: Secondary | ICD-10-CM | POA: Diagnosis not present

## 2012-08-04 DIAGNOSIS — E119 Type 2 diabetes mellitus without complications: Secondary | ICD-10-CM | POA: Diagnosis not present

## 2012-08-04 DIAGNOSIS — Z4889 Encounter for other specified surgical aftercare: Secondary | ICD-10-CM | POA: Diagnosis not present

## 2012-08-05 DIAGNOSIS — E119 Type 2 diabetes mellitus without complications: Secondary | ICD-10-CM | POA: Diagnosis not present

## 2012-08-05 DIAGNOSIS — Z4889 Encounter for other specified surgical aftercare: Secondary | ICD-10-CM | POA: Diagnosis not present

## 2012-08-08 ENCOUNTER — Other Ambulatory Visit: Payer: Self-pay | Admitting: Family Medicine

## 2012-08-08 ENCOUNTER — Ambulatory Visit (INDEPENDENT_AMBULATORY_CARE_PROVIDER_SITE_OTHER): Payer: Medicare Other | Admitting: Family Medicine

## 2012-08-08 ENCOUNTER — Encounter: Payer: Self-pay | Admitting: Family Medicine

## 2012-08-08 ENCOUNTER — Telehealth: Payer: Self-pay | Admitting: Family Medicine

## 2012-08-08 VITALS — BP 110/70 | HR 82 | Resp 18 | Wt 209.1 lb

## 2012-08-08 DIAGNOSIS — F3289 Other specified depressive episodes: Secondary | ICD-10-CM

## 2012-08-08 DIAGNOSIS — S0990XA Unspecified injury of head, initial encounter: Secondary | ICD-10-CM | POA: Diagnosis not present

## 2012-08-08 DIAGNOSIS — E039 Hypothyroidism, unspecified: Secondary | ICD-10-CM

## 2012-08-08 DIAGNOSIS — I10 Essential (primary) hypertension: Secondary | ICD-10-CM | POA: Diagnosis not present

## 2012-08-08 DIAGNOSIS — Z23 Encounter for immunization: Secondary | ICD-10-CM

## 2012-08-08 DIAGNOSIS — R0989 Other specified symptoms and signs involving the circulatory and respiratory systems: Secondary | ICD-10-CM | POA: Diagnosis not present

## 2012-08-08 DIAGNOSIS — F329 Major depressive disorder, single episode, unspecified: Secondary | ICD-10-CM

## 2012-08-08 DIAGNOSIS — E119 Type 2 diabetes mellitus without complications: Secondary | ICD-10-CM

## 2012-08-08 DIAGNOSIS — E785 Hyperlipidemia, unspecified: Secondary | ICD-10-CM

## 2012-08-08 DIAGNOSIS — M549 Dorsalgia, unspecified: Secondary | ICD-10-CM

## 2012-08-08 LAB — CBC
HCT: 29.1 % — ABNORMAL LOW (ref 36.0–46.0)
Hemoglobin: 9.9 g/dL — ABNORMAL LOW (ref 12.0–15.0)
MCV: 90.1 fL (ref 78.0–100.0)
RBC: 3.23 MIL/uL — ABNORMAL LOW (ref 3.87–5.11)
RDW: 14.3 % (ref 11.5–15.5)
WBC: 9.3 10*3/uL (ref 4.0–10.5)

## 2012-08-08 LAB — LIPID PANEL
HDL: 38 mg/dL — ABNORMAL LOW (ref >39)
LDL Cholesterol: 86 mg/dL (ref 0–99)
Total CHOL/HDL Ratio: 4.2 Ratio
Triglycerides: 169 mg/dL — ABNORMAL HIGH (ref <150)

## 2012-08-08 LAB — COMPLETE METABOLIC PANEL WITH GFR
AST: 12 U/L (ref 0–37)
Albumin: 4.1 g/dL (ref 3.5–5.2)
Alkaline Phosphatase: 51 U/L (ref 39–117)
BUN: 26 mg/dL — ABNORMAL HIGH (ref 6–23)
Creat: 1.87 mg/dL — ABNORMAL HIGH (ref 0.50–1.10)
GFR, Est Non African American: 28 mL/min — ABNORMAL LOW
Glucose, Bld: 98 mg/dL (ref 70–99)
Total Bilirubin: 0.5 mg/dL (ref 0.3–1.2)

## 2012-08-08 NOTE — Assessment & Plan Note (Signed)
Hyperlipidemia:Low fat diet discussed and encouraged. Updated labs today

## 2012-08-08 NOTE — Assessment & Plan Note (Signed)
Slightly increased with recent fall, otherwise unchanged

## 2012-08-08 NOTE — Assessment & Plan Note (Signed)
Recurrent light headedness and bruit, doppler study to further eval arteries

## 2012-08-08 NOTE — Assessment & Plan Note (Signed)
Controlled, no change in medication  

## 2012-08-08 NOTE — Assessment & Plan Note (Signed)
Recent head trauma with periorbital bruising and pain, ct scan of head

## 2012-08-08 NOTE — Patient Instructions (Addendum)
Annual wellness in early December.  Fasting lipid, cmp and eGFr , cbc and iron today.  You are referred for a head CT scan and carotid doppler   Dose reduction to benazepril 20mg  one daily  Flu vaccine today

## 2012-08-08 NOTE — Progress Notes (Signed)
  Subjective:    Patient ID: Tammy Suarez, female    DOB: 06/02/1946, 66 y.o.   MRN: [id number]  HPI The PT is here for follow up and re-evaluation of chronic medical conditions, medication management and review of any available recent lab and radiology data.  Preventive health is updated, specifically  Cancer screening and Immunization.   Questions or concerns regarding consultations or procedures which the PT has had in the interim are  Addressed.Had surgery on lower abdominal skin fold where she has recurrent infections and skin breakdown, done at [facility], healing well The PT denies any adverse reactions to current medications since the last visit.  Recurrent light headedness and fallen 3 times since August most recent was 3 days ago when she bent down, had black and blue area around her right eye, and various scratches on right upper arm and face     Review of Systems    See HPI Denies recent fever or chills. Denies sinus pressure, nasal congestion, ear pain or sore throat. Denies chest congestion, productive cough or wheezing. Denies chest pains, palpitations and leg swelling Denies abdominal pain, nausea, vomiting,diarrhea or constipation.   Denies dysuria, frequency, hesitancy or incontinence. Chronic  joint pain, and limitation in mobility. Denies  seizures, numbness, or tingling. Denies uncontrolled depression, anxiety or insomnia.     Objective:   Physical Exam  Patient alert and oriented and in no cardiopulmonary distress.  HEENT: No facial asymmetry, EOMI, no sinus tenderness,  oropharynx pink and moist.  Neck supple no adenopathy.Right periorbital bruise, bruit Chest: Clear to auscultation bilaterally.  CVS: S1, S2 no murmurs, no S3.  ABD: Soft non tender. Bowel sounds normal.  Ext: No edema  MS: decreased ROM spine,adequate in  shoulders, hips and knees.  Skin:lacerations and scratch marks seen on face and upper extremity  Psych: Good eye  contact, normal affect. Memory intact not anxious or depressed appearing.  CNS: CN 2-12 intact, power, tone and sensation normal throughout.       Assessment & Plan:

## 2012-08-08 NOTE — Assessment & Plan Note (Signed)
Diet controlled only

## 2012-08-08 NOTE — Assessment & Plan Note (Signed)
May be over corrected, h/o recurrent falls with light headedness, reduce dose of medciation

## 2012-08-09 ENCOUNTER — Telehealth: Payer: Self-pay | Admitting: Family Medicine

## 2012-08-09 DIAGNOSIS — E875 Hyperkalemia: Secondary | ICD-10-CM

## 2012-08-09 MED ORDER — SODIUM POLYSTYRENE SULFONATE PO POWD: ORAL | Status: DC

## 2012-08-09 NOTE — Telephone Encounter (Signed)
Pt called back, gave her instructions on kayexalte, repeat BMET at 3pm

## 2012-08-09 NOTE — Telephone Encounter (Signed)
Noted, thanks, msg left for pt to call office for f/u labs etc, also nephrology referral if none through [facility]

## 2012-08-09 NOTE — Telephone Encounter (Signed)
Called pt, she is doing fine except feels tired at times, no CP, no palpitations, no SOB ACEI cut back at appt today, she has been taking extra potassium when she gets cramps  CR 1.87 which is a little worse than baseline past few months but not critical Kayexalate to be called in , Advised to stop potassium

## 2012-08-10 ENCOUNTER — Other Ambulatory Visit (HOSPITAL_COMMUNITY): Payer: Medicare Other

## 2012-08-10 LAB — BASIC METABOLIC PANEL
BUN: 28 mg/dL — ABNORMAL HIGH (ref 6–23)
CO2: 26 mEq/L (ref 19–32)
Calcium: 9.9 mg/dL (ref 8.4–10.5)
Chloride: 102 mEq/L (ref 96–112)
Creat: 2.01 mg/dL — ABNORMAL HIGH (ref 0.50–1.10)

## 2012-08-10 NOTE — Telephone Encounter (Signed)
Patient is aware 

## 2012-08-11 ENCOUNTER — Other Ambulatory Visit: Payer: Self-pay | Admitting: Family Medicine

## 2012-08-11 ENCOUNTER — Ambulatory Visit (HOSPITAL_COMMUNITY)
Admission: RE | Admit: 2012-08-11 | Discharge: 2012-08-11 | Disposition: A | Discharge status: Home / Self Care | Payer: Medicare Other | Source: Ambulatory Visit | Attending: Family Medicine | Admitting: Family Medicine

## 2012-08-11 DIAGNOSIS — R42 Dizziness and giddiness: Secondary | ICD-10-CM | POA: Diagnosis not present

## 2012-08-11 DIAGNOSIS — R0989 Other specified symptoms and signs involving the circulatory and respiratory systems: Secondary | ICD-10-CM | POA: Diagnosis not present

## 2012-08-11 DIAGNOSIS — S0003XA Contusion of scalp, initial encounter: Secondary | ICD-10-CM | POA: Insufficient documentation

## 2012-08-11 DIAGNOSIS — S0083XA Contusion of other part of head, initial encounter: Secondary | ICD-10-CM | POA: Insufficient documentation

## 2012-08-11 DIAGNOSIS — S0990XA Unspecified injury of head, initial encounter: Secondary | ICD-10-CM

## 2012-08-11 DIAGNOSIS — W19XXXA Unspecified fall, initial encounter: Secondary | ICD-10-CM | POA: Insufficient documentation

## 2012-08-11 NOTE — Telephone Encounter (Signed)
pls see message requesting referral to nephrology, and give pt info please

## 2012-08-11 NOTE — Telephone Encounter (Signed)
pls refer pt to Dr Levie Heritage office evaluate and treat chronic kidney disease, let her know we are getting a nearer appt , and when she gets date set, she should cancel the one at [facility] and let them know why, (for her convenience)

## 2012-08-11 NOTE — Telephone Encounter (Signed)
She is scheduled to see nephrology at [facility] Nov 19 but she would rather be referred to someone close by here if possible.  Home # [phone]

## 2012-08-12 DIAGNOSIS — Z4889 Encounter for other specified surgical aftercare: Secondary | ICD-10-CM | POA: Diagnosis not present

## 2012-08-12 DIAGNOSIS — E119 Type 2 diabetes mellitus without complications: Secondary | ICD-10-CM | POA: Diagnosis not present

## 2012-08-12 NOTE — Telephone Encounter (Signed)
Patient aware.

## 2012-09-02 DIAGNOSIS — Z4889 Encounter for other specified surgical aftercare: Secondary | ICD-10-CM | POA: Diagnosis not present

## 2012-09-02 DIAGNOSIS — E119 Type 2 diabetes mellitus without complications: Secondary | ICD-10-CM | POA: Diagnosis not present

## 2012-09-05 ENCOUNTER — Other Ambulatory Visit: Payer: Self-pay | Admitting: Family Medicine

## 2012-09-06 ENCOUNTER — Other Ambulatory Visit: Payer: Self-pay

## 2012-09-06 ENCOUNTER — Telehealth: Payer: Self-pay | Admitting: Family Medicine

## 2012-09-07 NOTE — Telephone Encounter (Signed)
Will forward to pcp

## 2012-09-10 ENCOUNTER — Other Ambulatory Visit: Payer: Self-pay | Admitting: Family Medicine

## 2012-09-12 NOTE — Telephone Encounter (Signed)
pls call this pt back, and appt with "kidney doc" was already authorized in Sept , pls give her appt info, pls check her referral box info

## 2012-09-14 NOTE — Telephone Encounter (Signed)
Left message to call back  

## 2012-10-04 ENCOUNTER — Telehealth: Payer: Self-pay | Admitting: Family Medicine

## 2012-10-04 NOTE — Telephone Encounter (Signed)
Noted  

## 2012-10-04 NOTE — Telephone Encounter (Signed)
noted 

## 2012-10-05 ENCOUNTER — Other Ambulatory Visit: Payer: Self-pay

## 2012-10-05 ENCOUNTER — Other Ambulatory Visit: Payer: Self-pay | Admitting: Family Medicine

## 2012-10-11 ENCOUNTER — Other Ambulatory Visit: Payer: Self-pay | Admitting: Family Medicine

## 2012-10-19 ENCOUNTER — Other Ambulatory Visit: Payer: Self-pay | Admitting: Family Medicine

## 2012-11-14 ENCOUNTER — Other Ambulatory Visit: Payer: Self-pay | Admitting: Family Medicine

## 2012-11-22 ENCOUNTER — Other Ambulatory Visit (HOSPITAL_COMMUNITY): Payer: Self-pay | Admitting: Nephrology

## 2012-11-22 DIAGNOSIS — N289 Disorder of kidney and ureter, unspecified: Secondary | ICD-10-CM

## 2012-11-24 ENCOUNTER — Encounter: Payer: Self-pay | Admitting: Family Medicine

## 2012-11-24 ENCOUNTER — Ambulatory Visit (INDEPENDENT_AMBULATORY_CARE_PROVIDER_SITE_OTHER): Payer: Medicare Other | Admitting: Family Medicine

## 2012-11-24 ENCOUNTER — Ambulatory Visit: Payer: Medicare Other | Admitting: Family Medicine

## 2012-11-24 VITALS — BP 122/78 | HR 74 | Resp 16 | Ht 62.0 in | Wt 213.0 lb

## 2012-11-24 DIAGNOSIS — Z0000 Encounter for general adult medical examination without abnormal findings: Secondary | ICD-10-CM | POA: Diagnosis not present

## 2012-11-24 DIAGNOSIS — E119 Type 2 diabetes mellitus without complications: Secondary | ICD-10-CM

## 2012-11-24 DIAGNOSIS — E785 Hyperlipidemia, unspecified: Secondary | ICD-10-CM | POA: Diagnosis not present

## 2012-11-24 DIAGNOSIS — E039 Hypothyroidism, unspecified: Secondary | ICD-10-CM

## 2012-11-24 DIAGNOSIS — F17200 Nicotine dependence, unspecified, uncomplicated: Secondary | ICD-10-CM

## 2012-11-24 LAB — COMPLETE METABOLIC PANEL WITH GFR
AST: 13 U/L (ref 0–37)
Albumin: 4 g/dL (ref 3.5–5.2)
Alkaline Phosphatase: 38 U/L — ABNORMAL LOW (ref 39–117)
Potassium: 4.8 mEq/L (ref 3.5–5.3)
Sodium: 132 mEq/L — ABNORMAL LOW (ref 135–145)
Total Protein: 6.8 g/dL (ref 6.0–8.3)

## 2012-11-24 LAB — LIPID PANEL
LDL Cholesterol: 85 mg/dL (ref 0–99)
Total CHOL/HDL Ratio: 6 Ratio
VLDL: 49 mg/dL — ABNORMAL HIGH (ref 0–40)

## 2012-11-24 LAB — TSH: TSH: 3.107 u[IU]/mL (ref 0.350–4.500)

## 2012-11-24 MED ORDER — BUPROPION HCL ER (SR) 150 MG PO TB12: 150.0000 mg | ORAL_TABLET | Freq: Two times a day (BID) | ORAL | Status: DC

## 2012-11-24 NOTE — Patient Instructions (Addendum)
F/u in 4 month  Labs today HBA1C, lipid, cmp and EGFr, TSH  Please start daily physical activity and drink only water, no sweetened drinks.   Wellbutrin is prescribed to help with smoking cessation, you will also get info.  Start Wellbutrin one daily for 3 days then increase to twice daily after that. You need to quit 2 weeks after starting the medication, so quit date is December 08, 2012  Think carefully about our discussion regarding pain medication, your choice should be changed to a med with no tylenol  Careful so you have no falls this year  Check your pharmacy for the shingles vaccine,you need this

## 2012-11-24 NOTE — Progress Notes (Signed)
Subjective:    Patient ID: Tammy Suarez, female    DOB: 1946-10-31, 67 y.o.   MRN: [id number]  HPI  Preventive Screening-Counseling & Management   Patient present here today for a Medicare annual wellness visit.   Current Problems (verified)   Medications Prior to Visit Allergies (verified)   PAST HISTORY  Family History 2 siblings deceased age 66 and 22, mI of sister age 66, cancer in brother at age 77, both parents ha 12, father died at 37, father at 38  Social History Single, was divorced twice, both deceased, mom of 2 living children, son died in MVA at age 67. Quit smoking 11/21/2012, will start zyban No alcohol or street drug use   Risk Factors  Current exercise habits: none, will start at 15 minutes total   Dietary issues discussed:increased fruit and veg   Cardiac risk factors: sister had CAD, pt has hyperlipidemia, diabetes  Depression Screen  (Note: if answer to either of the following is "Yes", a more complete depression screening is indicated)   Over the past two weeks, have you felt down, depressed or hopeless? No  Over the past two weeks, have you felt little interest or pleasure in doing things? No  Have you lost interest or pleasure in daily life? No  Do you often feel hopeless? No  Do you cry easily over simple problems? No   Activities of Daily Living  In your present state of health, do you have any difficulty performing the following activities?  Driving?: No Managing money?: No Feeding yourself?:No Getting from bed to chair?:sometimes  Climbing a flight of stairs?yes Preparing food and eating?:No Bathing or showering?:No Getting dressed?:No Getting to the toilet?:No Using the toilet?:No Moving around from place to place?: at times stiff  Joints, has a cane does not use it  Fall Risk Assessment In the past year have you fallen or had a near fall?:twice after bending also reports uncontrolled joint pain Are you currently taking any  medications that make you dizziness?:No   Hearing Difficulties: No Do you often ask people to speak up or repeat themselves?:No Do you experience ringing or noises in your ears?:No Do you have difficulty understanding soft or whispered voices?:No  Cognitive Testing  Alert? Yes Normal Appearance?Yes  Oriented to person? Yes Place? Yes  Time? Yes  Displays appropriate judgment?Yes  Can read the correct time from a watch face? yes Are you having problems remembering things?No  Advanced Directives have been discussed with the patient?Yes    List the Names of Other Physician/Practitioners you currently use: [place] [facility], nephrology, dr [facility] for non healing ulcer   I Assessment:    Annual Wellness Exam   Plan:    During the course of the visit the patient was educated and counseled about appropriate screening and preventive services including:  A healthy diet is rich in fruit, vegetables and whole grains. Poultry fish, nuts and beans are a healthy choice for protein rather then red meat. A low sodium diet and drinking 64 ounces of water daily is generally recommended. Oils and sweet should be limited. Carbohydrates especially for those who are diabetic or overweight, should be limited to 30-45 gram per meal. It is important to eat on a regular schedule, at least 3 times daily. Snacks should be primarily fruits, vegetables or nuts. It is important that you exercise regularly at least 30 minutes 5 times a week. If you develop chest pain, have severe difficulty breathing, or feel very  tired, stop exercising immediately and seek medical attention  Immunization reviewed and updated. Cancer screening reviewed and updated    Patient Instructions (the written plan) was given to the patient.  Medicare Attestation  I have personally reviewed:  The patient's medical and social history  Their use of alcohol, tobacco or illicit drugs  Their current medications and  supplements  The patient's functional ability including ADLs,fall risks, home safety risks, cognitive, and hearing and visual impairment  Diet and physical activities  Evidence for depression or mood disorders  The patient's weight, height, BMI, and visual acuity have been recorded in the chart. I have made referrals, counseling, and provided education to the patient based on review of the above and I have provided the patient with a written personalized care plan for preventive services.      Review of Systems     Objective:   Physical Exam        Assessment & Plan:

## 2012-12-01 ENCOUNTER — Other Ambulatory Visit: Payer: Self-pay | Admitting: Family Medicine

## 2012-12-07 ENCOUNTER — Telehealth: Payer: Self-pay | Admitting: Family Medicine

## 2012-12-07 ENCOUNTER — Other Ambulatory Visit: Payer: Self-pay

## 2012-12-07 MED ORDER — HYDROCODONE-ACETAMINOPHEN 10-325 MG PO TABS: 1.0000 | ORAL_TABLET | Freq: Four times a day (QID) | ORAL | Status: DC

## 2012-12-07 NOTE — Telephone Encounter (Signed)
Med refilled awaiting signature.

## 2012-12-11 ENCOUNTER — Other Ambulatory Visit: Payer: Self-pay | Admitting: Family Medicine

## 2012-12-12 ENCOUNTER — Other Ambulatory Visit: Payer: Self-pay

## 2012-12-12 ENCOUNTER — Ambulatory Visit (HOSPITAL_COMMUNITY)
Admission: RE | Admit: 2012-12-12 | Discharge: 2012-12-12 | Disposition: A | Discharge status: Home / Self Care | Payer: Medicare Other | Source: Ambulatory Visit | Attending: Nephrology | Admitting: Nephrology

## 2012-12-12 DIAGNOSIS — D649 Anemia, unspecified: Secondary | ICD-10-CM | POA: Diagnosis not present

## 2012-12-12 DIAGNOSIS — E559 Vitamin D deficiency, unspecified: Secondary | ICD-10-CM | POA: Diagnosis not present

## 2012-12-12 DIAGNOSIS — R809 Proteinuria, unspecified: Secondary | ICD-10-CM | POA: Diagnosis not present

## 2012-12-12 DIAGNOSIS — I10 Essential (primary) hypertension: Secondary | ICD-10-CM | POA: Diagnosis not present

## 2012-12-12 DIAGNOSIS — N189 Chronic kidney disease, unspecified: Secondary | ICD-10-CM | POA: Diagnosis not present

## 2012-12-12 DIAGNOSIS — N289 Disorder of kidney and ureter, unspecified: Secondary | ICD-10-CM | POA: Insufficient documentation

## 2012-12-12 DIAGNOSIS — Z79899 Other long term (current) drug therapy: Secondary | ICD-10-CM | POA: Diagnosis not present

## 2012-12-18 NOTE — Assessment & Plan Note (Signed)
Annual wellness completed as documented. Pt encouraged to work hard on safety, increased physical activity and weight loss. Zostavax still needed, she will get based on the cost

## 2012-12-19 IMAGING — MG MM DIGITAL SCREENING BILAT
7 series · 7 of 7 positions shown · non-contrast
Comparison: Previous exams.

CLINICAL DATA: Screening.

DIGITAL BILATERAL SCREENING MAMMOGRAM WITH CAD

[L CC (1 of 2)]
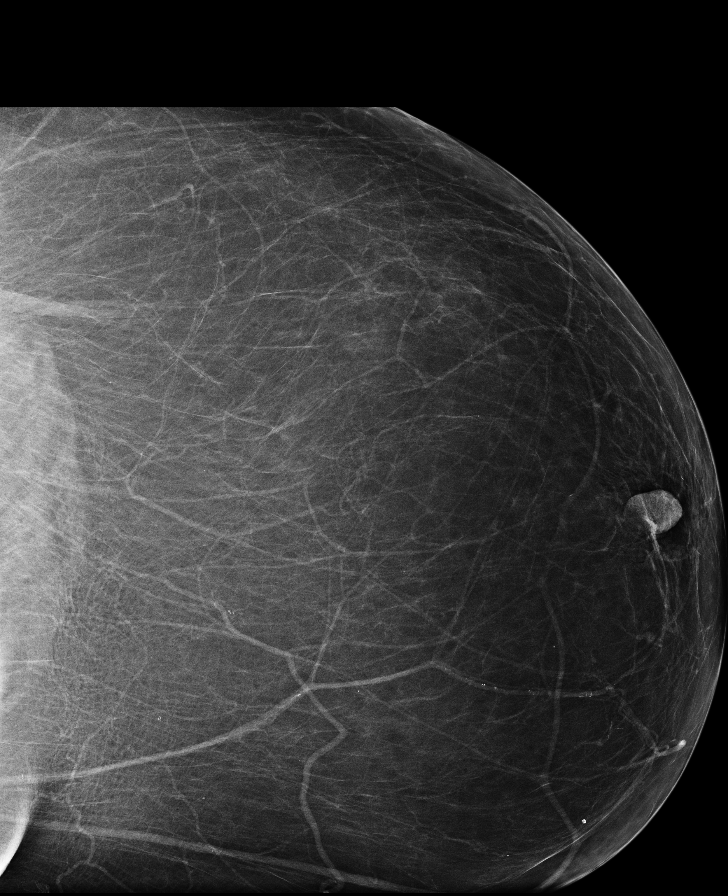

[L MLO (1 of 2)]
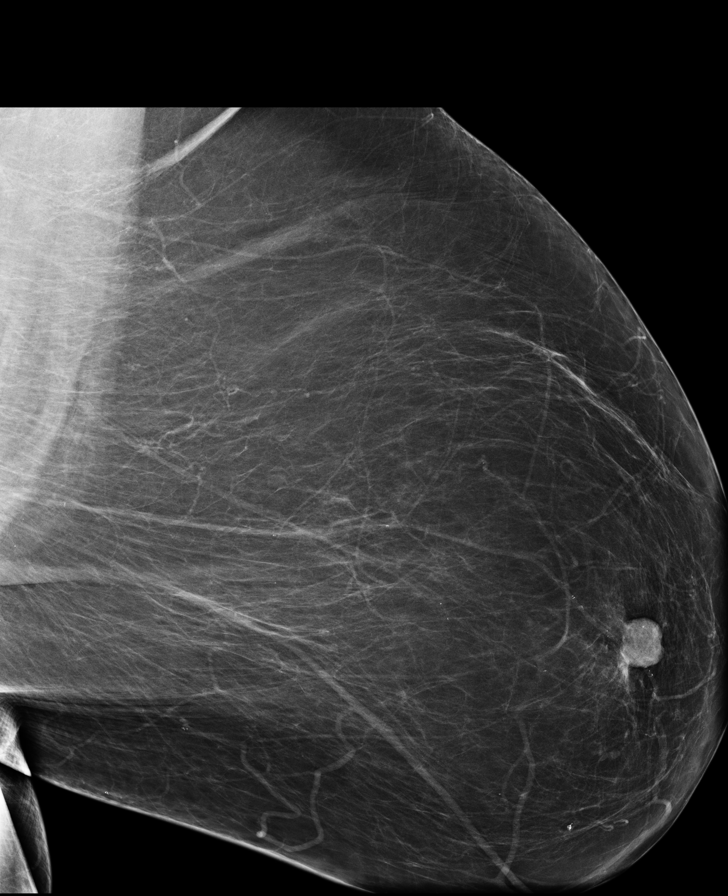

[R CC (1 of 2)]
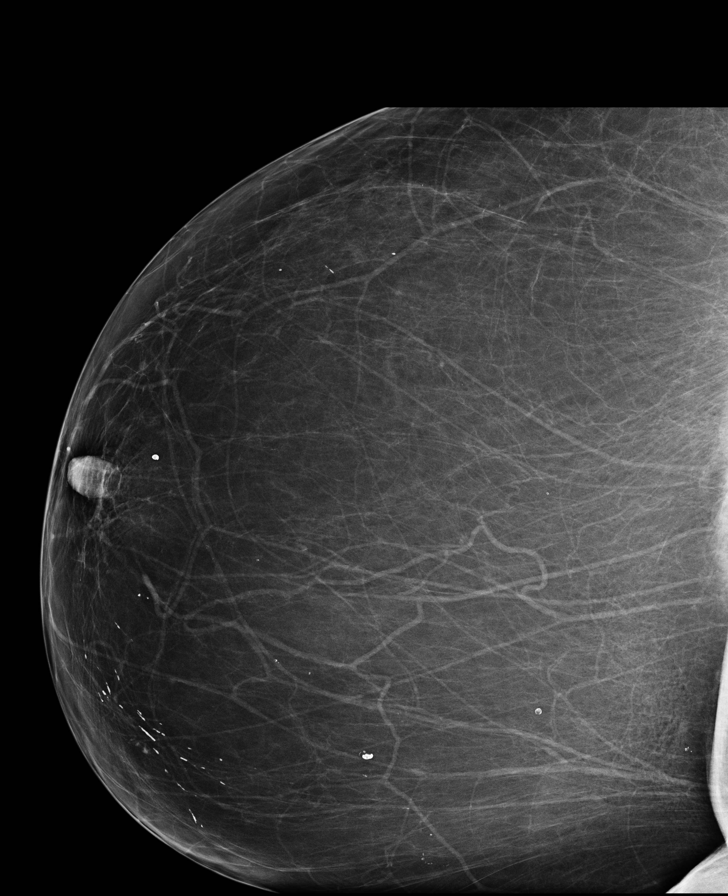

[R MLO]
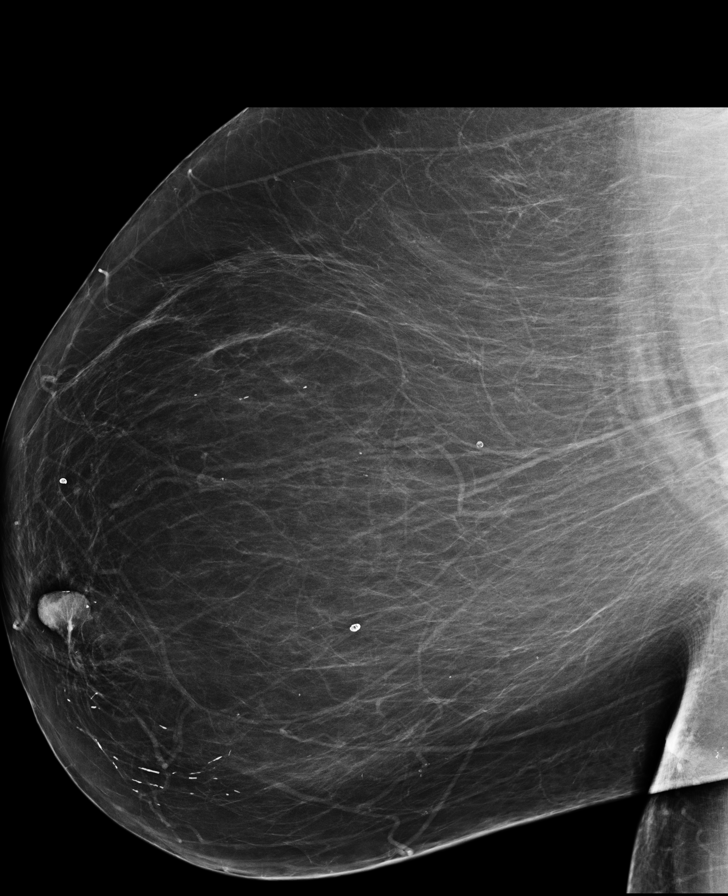

[L CC (2 of 2)]
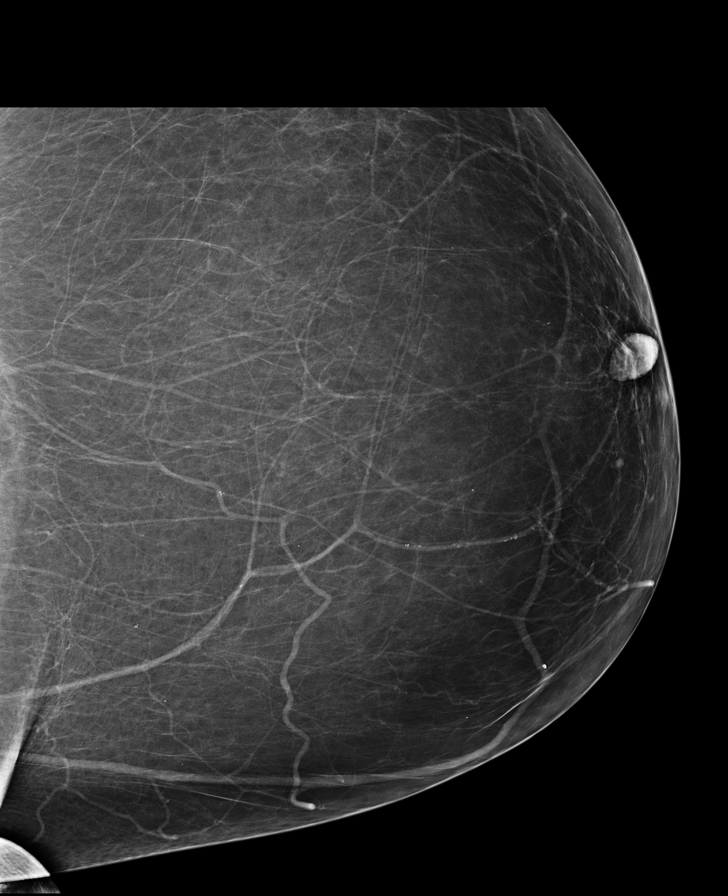

[L MLO (2 of 2)]
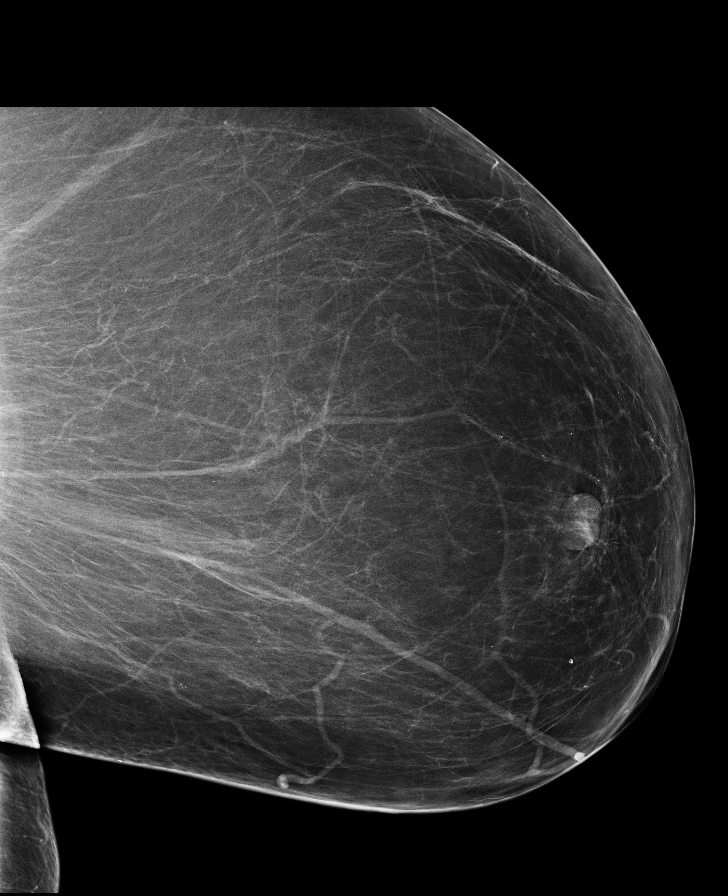

[R CC (2 of 2)]
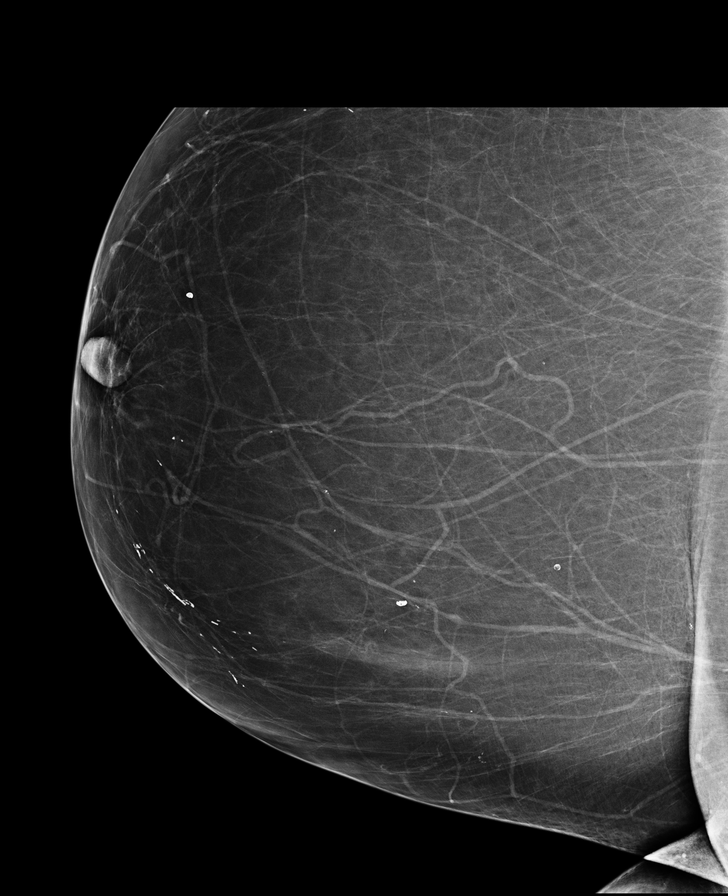

[7 of 7 positions shown; findings below may reference images not displayed]

FINDINGS: The breast tissue is almost entirely fatty. No suspicious
masses, architectural distortion, or calcifications are present.

Images were processed with CAD.
IMPRESSION: No mammographic evidence of malignancy.

A result letter of this screening mammogram will be mailed directly
to the patient.

RECOMMENDATION:
Screening mammogram in one year. (Code:5Y-M-L37)

BI-RADS CATEGORY 1:  Negative.

## 2013-01-01 ENCOUNTER — Emergency Department (HOSPITAL_COMMUNITY)
Admission: EM | Admit: 2013-01-01 | Discharge: 2013-01-01 | Disposition: A | Discharge status: Home / Self Care | Payer: Medicare Other | Attending: Emergency Medicine | Admitting: Emergency Medicine

## 2013-01-01 ENCOUNTER — Encounter (HOSPITAL_COMMUNITY): Payer: Self-pay | Admitting: *Deleted

## 2013-01-01 DIAGNOSIS — Z8669 Personal history of other diseases of the nervous system and sense organs: Secondary | ICD-10-CM | POA: Insufficient documentation

## 2013-01-01 DIAGNOSIS — E119 Type 2 diabetes mellitus without complications: Secondary | ICD-10-CM | POA: Diagnosis not present

## 2013-01-01 DIAGNOSIS — F17200 Nicotine dependence, unspecified, uncomplicated: Secondary | ICD-10-CM | POA: Insufficient documentation

## 2013-01-01 DIAGNOSIS — Y838 Other surgical procedures as the cause of abnormal reaction of the patient, or of later complication, without mention of misadventure at the time of the procedure: Secondary | ICD-10-CM | POA: Insufficient documentation

## 2013-01-01 DIAGNOSIS — E785 Hyperlipidemia, unspecified: Secondary | ICD-10-CM | POA: Diagnosis not present

## 2013-01-01 DIAGNOSIS — K219 Gastro-esophageal reflux disease without esophagitis: Secondary | ICD-10-CM | POA: Diagnosis not present

## 2013-01-01 DIAGNOSIS — Z9889 Other specified postprocedural states: Secondary | ICD-10-CM | POA: Diagnosis not present

## 2013-01-01 DIAGNOSIS — I10 Essential (primary) hypertension: Secondary | ICD-10-CM | POA: Insufficient documentation

## 2013-01-01 DIAGNOSIS — Z8639 Personal history of other endocrine, nutritional and metabolic disease: Secondary | ICD-10-CM | POA: Insufficient documentation

## 2013-01-01 DIAGNOSIS — E669 Obesity, unspecified: Secondary | ICD-10-CM | POA: Insufficient documentation

## 2013-01-01 DIAGNOSIS — Z79899 Other long term (current) drug therapy: Secondary | ICD-10-CM | POA: Insufficient documentation

## 2013-01-01 DIAGNOSIS — Z7982 Long term (current) use of aspirin: Secondary | ICD-10-CM | POA: Insufficient documentation

## 2013-01-01 DIAGNOSIS — T8189XA Other complications of procedures, not elsewhere classified, initial encounter: Secondary | ICD-10-CM | POA: Diagnosis not present

## 2013-01-01 DIAGNOSIS — G8929 Other chronic pain: Secondary | ICD-10-CM | POA: Insufficient documentation

## 2013-01-01 MED ORDER — NAPROXEN 500 MG PO TABS: 500.0000 mg | ORAL_TABLET | Freq: Two times a day (BID) | ORAL | Status: DC

## 2013-01-01 NOTE — ED Notes (Addendum)
Pt states she had cellulite cut off stomach 2 mo ago and it isn't healing well. Surgeon told pt that it would take a while but pt believes it is taking too long. Pt states, "My daughter was here being seen so I thought I might as well check in too."

## 2013-01-01 NOTE — ED Provider Notes (Signed)
History    This chart was scribed for Tammy Acosta, MD by Malen Gauze, ED Scribe. The patient was seen in room APA07/APA07 and the patient's care was started at 3:24PM.    CSN: [id number]  Arrival date & time 01/01/13  1338   First MD Initiated Contact with Patient 01/01/13 1423      Chief Complaint  Patient presents with  . Post-op Problem    (Consider location/radiation/quality/duration/timing/severity/associated sxs/prior treatment) The history is provided by the patient. No language interpreter was used.   Tammy Suarez is a 67 y.o. female who presents to the Emergency Department complaining of persistent, mild to moderate post-op abdominal scarring with an onset 6 months ago. She reports that in 9/13, she underwent a procedure where abdominal skin and fat were excised at [facility]. She currently reports moderate abdominal pain. She reports anitbiotic cream at home is not alleviated her skin. Denies HA, fever, neck pain, sore throat, back pain, CP, SOB, nausea, emesis, diarrhea, dysuria, or extremity pain, edema, weakness, numbness, or tingling. Denies history of DM. No known allergies. No other pertinent medical symptoms.  Past Medical History  Diagnosis Date  . Vitamin B 12 deficiency   . Chronic back pain   . GERD (gastroesophageal reflux disease)   . Nicotine addiction   . Hyperlipidemia   . Obesity   . Hypertension   . Type 2 diabetes mellitus without complications   . Conjunctivitis, acute     Past Surgical History  Procedure Laterality Date  . Cervical laminectomy    . Cholecystectomy    . Inguinal herniorrhaphy    . Vesicovaginal fistula closure w/ tah    . Lumbar fusion    . Left index finger repair    . Laparoscopic gastric banding  2009  . Cosmetic surgery  07/01/2012    recurrent skin infection on lower abdomen, [facility]  . Abdominal hysterectomy    . Spine surgery      Family History  Problem Relation Age of Onset  . Heart attack Mother   .  Heart attack Father   . Hypertension Sister   . Heart attack Sister   . Cancer Brother     renal cell     History  Substance Use Topics  . Smoking status: Current Some Day Smoker    Last Attempt to Quit: 05/08/2012  . Smokeless tobacco: Not on file  . Alcohol Use: No    OB History   Grav Para Term Preterm Abortions TAB SAB Ect Mult Living                  Review of Systems 10 Systems reviewed and all are negative for acute change except as noted in the HPI.   Allergies  Meperidine hcl; Niacin; and Propoxyphene-acetaminophen  Home Medications   Current Outpatient Rx  Name  Route  Sig  Dispense  Refill  . amitriptyline (ELAVIL) 25 MG tablet   Oral   Take 25 mg by mouth at bedtime.         Marland Kitchen aspirin (BAYER ASPIRIN EC LOW DOSE) 81 MG EC tablet   Oral   Take 81 mg by mouth daily.           . benazepril (LOTENSIN) 20 MG tablet      Dose reduction effective 08/08/2012 Discontinue benazepril 40mg    30 tablet   11   . buPROPion (WELLBUTRIN SR) 150 MG 12 hr tablet   Oral   Take 1 tablet (150  mg total) by mouth 2 (two) times daily.   60 tablet   2   . calcium-vitamin D (CALCIUM 500+D) 500-200 MG-UNIT per tablet   Oral   Take 1 tablet by mouth 2 (two) times daily.          . Choline Fenofibrate 135 MG capsule   Oral   Take 135 mg by mouth daily.         . clonazePAM (KLONOPIN) 1 MG tablet   Oral   Take 1 mg by mouth 3 (three) times daily.         . CYMBALTA 60 MG capsule   Oral   Take 60 mg by mouth 2 (two) times daily.         . fish oil-omega-3 fatty acids 1000 MG capsule   Oral   Take 2 g by mouth daily.           Marland Kitchen HYDROcodone-acetaminophen (NORCO) 10-325 MG per tablet   Oral   Take 1 tablet by mouth 4 (four) times daily.   120 tablet   0   . levothyroxine (SYNTHROID, LEVOTHROID) 50 MCG tablet   Oral   Take 50 mcg by mouth daily.         Marland Kitchen loratadine (CLARITIN) 10 MG tablet   Oral   Take 10 mg by mouth daily.         .  Multiple Vitamin (MULTIVITAMIN) tablet   Oral   Take 1 tablet by mouth daily.           . ranitidine (ZANTAC) 150 MG tablet   Oral   Take 150 mg by mouth 2 (two) times daily.         . simvastatin (ZOCOR) 20 MG tablet   Oral   Take 20 mg by mouth at bedtime.         . naproxen (NAPROSYN) 500 MG tablet   Oral   Take 1 tablet (500 mg total) by mouth 2 (two) times daily with a meal.   30 tablet   0     BP 147/55  Pulse 65  Temp(Src) 97.9 F (36.6 C) (Oral)  Resp 16  SpO2 97%  Physical Exam  Nursing note and vitals reviewed. Constitutional: She is oriented to person, place, and time. She appears well-developed and well-nourished.  Non-toxic appearance. She does not appear ill. No distress.  HENT:  Head: Normocephalic and atraumatic.  Right Ear: External ear normal.  Left Ear: External ear normal.  Nose: Nose normal. No mucosal edema or rhinorrhea.  Mouth/Throat: Oropharynx is clear and moist and mucous membranes are normal. No dental abscesses or edematous.  Eyes: Conjunctivae and EOM are normal. Pupils are equal, round, and reactive to light.  Neck: Normal range of motion and full passive range of motion without pain. Neck supple.  Cardiovascular: Normal rate, regular rhythm and normal heart sounds.  Exam reveals no gallop and no friction rub.   No murmur heard. Pulmonary/Chest: Effort normal and breath sounds normal. No respiratory distress. She has no wheezes. She has no rhonchi. She has no rales. She exhibits no tenderness and no crepitus.  Abdominal: Soft. Normal appearance and bowel sounds are normal. She exhibits no distension. There is no tenderness. There is no rebound and no guarding.  Pt has soft adn non tender abd but has a small area on the R side of the abodmen that has healthy pink healing tissue without indudration or surrounding erythema or ttp.  No drainage.  Musculoskeletal:  Normal range of motion. She exhibits no edema and no tenderness.  Moves all  extremities well.   Neurological: She is alert and oriented to person, place, and time. She has normal strength. No cranial nerve deficit.  Skin: Skin is warm, dry and intact.  Psychiatric: She has a normal mood and affect. Her speech is normal and behavior is normal. Her mood appears not anxious.    ED Course  Procedures (including critical care time)  DIAGNOSTIC STUDIES: Oxygen Saturation is 97% on room air, normal by my interpretation.    COORDINATION OF CARE:  3:30PM - RILLIE ODE is advised that this is normal post op scarring without infection and that antibiotic cream should continue to be applied at home.. She is ready for d/c.   Labs Reviewed - No data to display No results found.   1. Non-healing surgical wound       MDM  Pt is well appaering, no signs of intraabdominal process, soft adn non tender, frustrated by delayed healing but no acute process,  refferred back to surgeon.   I personally performed the services described in this documentation, which was scribed in my presence. The recorded information has been reviewed and is accurate.         Tammy Acosta, MD 01/01/13 [phone]

## 2013-01-04 ENCOUNTER — Other Ambulatory Visit: Payer: Self-pay | Admitting: Family Medicine

## 2013-01-09 ENCOUNTER — Telehealth: Payer: Self-pay | Admitting: Family Medicine

## 2013-01-10 ENCOUNTER — Other Ambulatory Visit: Payer: Self-pay

## 2013-01-10 MED ORDER — HYDROCODONE-ACETAMINOPHEN 10-325 MG PO TABS: 1.0000 | ORAL_TABLET | Freq: Four times a day (QID) | ORAL | Status: DC

## 2013-01-10 NOTE — Telephone Encounter (Signed)
Med refilled.

## 2013-01-19 DIAGNOSIS — N189 Chronic kidney disease, unspecified: Secondary | ICD-10-CM | POA: Diagnosis not present

## 2013-01-19 DIAGNOSIS — Z79899 Other long term (current) drug therapy: Secondary | ICD-10-CM | POA: Diagnosis not present

## 2013-01-25 DIAGNOSIS — Z5189 Encounter for other specified aftercare: Secondary | ICD-10-CM | POA: Diagnosis not present

## 2013-01-31 ENCOUNTER — Ambulatory Visit: Payer: Medicare Other | Admitting: Urology

## 2013-02-02 DIAGNOSIS — T8130XA Disruption of wound, unspecified, initial encounter: Secondary | ICD-10-CM | POA: Diagnosis not present

## 2013-02-02 DIAGNOSIS — K219 Gastro-esophageal reflux disease without esophagitis: Secondary | ICD-10-CM | POA: Diagnosis not present

## 2013-02-02 DIAGNOSIS — F329 Major depressive disorder, single episode, unspecified: Secondary | ICD-10-CM | POA: Diagnosis not present

## 2013-02-02 DIAGNOSIS — I129 Hypertensive chronic kidney disease with stage 1 through stage 4 chronic kidney disease, or unspecified chronic kidney disease: Secondary | ICD-10-CM | POA: Diagnosis not present

## 2013-02-02 DIAGNOSIS — F411 Generalized anxiety disorder: Secondary | ICD-10-CM | POA: Diagnosis not present

## 2013-02-02 DIAGNOSIS — N184 Chronic kidney disease, stage 4 (severe): Secondary | ICD-10-CM | POA: Diagnosis not present

## 2013-02-02 DIAGNOSIS — I498 Other specified cardiac arrhythmias: Secondary | ICD-10-CM | POA: Diagnosis not present

## 2013-02-02 DIAGNOSIS — E039 Hypothyroidism, unspecified: Secondary | ICD-10-CM | POA: Diagnosis not present

## 2013-02-02 DIAGNOSIS — E785 Hyperlipidemia, unspecified: Secondary | ICD-10-CM | POA: Diagnosis not present

## 2013-02-02 DIAGNOSIS — F3289 Other specified depressive episodes: Secondary | ICD-10-CM | POA: Diagnosis not present

## 2013-02-02 DIAGNOSIS — Z87891 Personal history of nicotine dependence: Secondary | ICD-10-CM | POA: Diagnosis not present

## 2013-02-03 DIAGNOSIS — K219 Gastro-esophageal reflux disease without esophagitis: Secondary | ICD-10-CM | POA: Diagnosis not present

## 2013-02-03 DIAGNOSIS — N184 Chronic kidney disease, stage 4 (severe): Secondary | ICD-10-CM | POA: Diagnosis not present

## 2013-02-03 DIAGNOSIS — I129 Hypertensive chronic kidney disease with stage 1 through stage 4 chronic kidney disease, or unspecified chronic kidney disease: Secondary | ICD-10-CM | POA: Diagnosis not present

## 2013-02-03 DIAGNOSIS — F3289 Other specified depressive episodes: Secondary | ICD-10-CM | POA: Diagnosis not present

## 2013-02-03 DIAGNOSIS — F329 Major depressive disorder, single episode, unspecified: Secondary | ICD-10-CM | POA: Diagnosis not present

## 2013-02-03 DIAGNOSIS — E039 Hypothyroidism, unspecified: Secondary | ICD-10-CM | POA: Diagnosis not present

## 2013-02-03 DIAGNOSIS — T8130XA Disruption of wound, unspecified, initial encounter: Secondary | ICD-10-CM | POA: Diagnosis not present

## 2013-02-03 DIAGNOSIS — T8189XA Other complications of procedures, not elsewhere classified, initial encounter: Secondary | ICD-10-CM | POA: Diagnosis not present

## 2013-02-06 ENCOUNTER — Other Ambulatory Visit: Payer: Self-pay | Admitting: Family Medicine

## 2013-02-09 ENCOUNTER — Other Ambulatory Visit: Payer: Self-pay | Admitting: Family Medicine

## 2013-02-20 ENCOUNTER — Telehealth: Payer: Self-pay | Admitting: Family Medicine

## 2013-02-20 ENCOUNTER — Other Ambulatory Visit: Payer: Self-pay | Admitting: Family Medicine

## 2013-02-20 MED ORDER — NAPROXEN 500 MG PO TABS: 500.0000 mg | ORAL_TABLET | Freq: Two times a day (BID) | ORAL | Status: DC

## 2013-02-20 NOTE — Telephone Encounter (Signed)
Pt aware.

## 2013-02-20 NOTE — Telephone Encounter (Signed)
Indomethacin has been discontinued, I have sent in indomethacin for 1 week only, pls let her klnow

## 2013-02-20 NOTE — Telephone Encounter (Signed)
Please advise.  Can indocin be refilled without office visit?

## 2013-02-22 DIAGNOSIS — N189 Chronic kidney disease, unspecified: Secondary | ICD-10-CM | POA: Diagnosis not present

## 2013-02-22 DIAGNOSIS — R809 Proteinuria, unspecified: Secondary | ICD-10-CM | POA: Diagnosis not present

## 2013-02-22 DIAGNOSIS — Z79899 Other long term (current) drug therapy: Secondary | ICD-10-CM | POA: Diagnosis not present

## 2013-03-04 ENCOUNTER — Other Ambulatory Visit: Payer: Self-pay | Admitting: Family Medicine

## 2013-03-06 ENCOUNTER — Other Ambulatory Visit: Payer: Self-pay | Admitting: Family Medicine

## 2013-03-12 ENCOUNTER — Other Ambulatory Visit: Payer: Self-pay | Admitting: Family Medicine

## 2013-03-23 ENCOUNTER — Ambulatory Visit (INDEPENDENT_AMBULATORY_CARE_PROVIDER_SITE_OTHER): Payer: Medicare Other | Admitting: Family Medicine

## 2013-03-23 ENCOUNTER — Encounter: Payer: Self-pay | Admitting: Family Medicine

## 2013-03-23 VITALS — BP 120/80 | HR 107 | Resp 15 | Ht 62.0 in | Wt 210.1 lb

## 2013-03-23 DIAGNOSIS — E119 Type 2 diabetes mellitus without complications: Secondary | ICD-10-CM | POA: Diagnosis not present

## 2013-03-23 DIAGNOSIS — K219 Gastro-esophageal reflux disease without esophagitis: Secondary | ICD-10-CM

## 2013-03-23 DIAGNOSIS — F411 Generalized anxiety disorder: Secondary | ICD-10-CM

## 2013-03-23 DIAGNOSIS — R5383 Other fatigue: Secondary | ICD-10-CM | POA: Diagnosis not present

## 2013-03-23 DIAGNOSIS — M549 Dorsalgia, unspecified: Secondary | ICD-10-CM

## 2013-03-23 DIAGNOSIS — E785 Hyperlipidemia, unspecified: Secondary | ICD-10-CM

## 2013-03-23 DIAGNOSIS — F17200 Nicotine dependence, unspecified, uncomplicated: Secondary | ICD-10-CM

## 2013-03-23 DIAGNOSIS — I10 Essential (primary) hypertension: Secondary | ICD-10-CM

## 2013-03-23 DIAGNOSIS — R5381 Other malaise: Secondary | ICD-10-CM

## 2013-03-23 DIAGNOSIS — E039 Hypothyroidism, unspecified: Secondary | ICD-10-CM

## 2013-03-23 DIAGNOSIS — Z1211 Encounter for screening for malignant neoplasm of colon: Secondary | ICD-10-CM

## 2013-03-23 DIAGNOSIS — R32 Unspecified urinary incontinence: Secondary | ICD-10-CM | POA: Diagnosis not present

## 2013-03-23 LAB — HEMOCCULT GUIAC POC 1CARD (OFFICE): Fecal Occult Blood, POC: NEGATIVE

## 2013-03-23 MED ORDER — SOLIFENACIN SUCCINATE 5 MG PO TABS: 5.0000 mg | ORAL_TABLET | Freq: Every day | ORAL | Status: DC

## 2013-03-23 NOTE — Patient Instructions (Addendum)
Annual wellness in 4 month, please call if you need me before  I am happy that you want to quiit smoking, you will get info on free classes at the health dept also quit line information   Rectal today.  New medication vesicare one daily for incontinence.  Please be careful so you do not fall as much   Fasting lipid, cmp and EGFr, HBA1C and TSH end July  Please check [facility] or [facility] about the shingles vaccine also [facility], you need this

## 2013-03-23 NOTE — Progress Notes (Signed)
  Subjective:    Patient ID: Tammy Suarez, female    DOB: October 22, 1946, 67 y.o.   MRN: [id number]  HPI The PT is here for follow up and re-evaluation of chronic medical conditions, medication management and review of any available recent lab and radiology data.  Preventive health is updated, specifically  Cancer screening and Immunization.   Questions or concerns regarding consultations or procedures which the PT has had in the interim are  Addressed.Sttaes her nephrologist referred her urologist re urinary incontinence. Ulcer on right lower abdominal wall has finally healed The PT denies any adverse reactions to current medications since the last visit.  C/o increased anxiety and stress due to adult children will cope with no counselling      Review of Systems See HPI Denies recent fever or chills. Denies sinus pressure, nasal congestion, ear pain or sore throat. Denies chest congestion, productive cough or wheezing. Denies chest pains, palpitations and leg swelling Denies abdominal pain, nausea, vomiting,diarrhea or constipation.   Denies dysuria, frequency, hesitancy or incontinence. Chronic bck pain with reduced mobility and h/o repeated falls. Denies headaches, seizures, numbness, or tingling. Denies skin break down or rash.        Objective:   Physical Exam Patient alert and oriented and in no cardiopulmonary distress.  HEENT: No facial asymmetry, EOMI, no sinus tenderness,  oropharynx pink and moist.  Neck supple no adenopathy.  Chest: Clear to auscultation bilaterally.decreased air entry bilaterally  CVS: S1, S2 no murmurs, no S3.  ABD: Soft non tender. Bowel sounds normal. Rectal: no mass, heme negative stool Ext: No edema  [id number]  ROM spine, shoulders, hips and knees.  Skin: Intact, no ulcerations or rash noted.  Psych: Good eye contact, normal affect. Memory intact not anxious or depressed appearing.  CNS: CN 2-12 intact, power,  normal  throughout.        Assessment & Plan:

## 2013-03-25 NOTE — Assessment & Plan Note (Signed)
Unchanged, no med change, weight loss encouraged

## 2013-03-25 NOTE — Assessment & Plan Note (Signed)
Controlled, no change in medication  

## 2013-03-25 NOTE — Assessment & Plan Note (Signed)
C/o stress with adult children, not out of control,no interest in therapy

## 2013-03-25 NOTE — Assessment & Plan Note (Signed)
Hyperlipidemia:Low fat diet discussed and encouraged.  Elevated TG, updated lab next visit

## 2013-03-25 NOTE — Assessment & Plan Note (Signed)
Diet controlled. Updated lab next visit. Patient educated about the importance of limiting  Carbohydrate intake , the need to commit to daily physical activity for a minimum of 30 minutes , and to commit weight loss. The fact that changes in all these areas will reduce or eliminate all together the development of diabetes is stressed.

## 2013-03-25 NOTE — Assessment & Plan Note (Signed)
Unchanged, reports wants to quit. Patient counseled for approximately 5 minutes regarding the health risks of ongoing nicotine use, specifically all types of cancer, heart disease, stroke and respiratory failure. The options available for help with cessation ,the behavioral changes to assist the process, and the option to either gradully reduce usage  Or abruptly stop.is also discussed. Pt is also encouraged to set specific goals in number of cigarettes used daily, as well as to set a quit date.

## 2013-03-25 NOTE — Assessment & Plan Note (Signed)
Worsening , trial of vesicare, has been referred to urology by nephrologist

## 2013-03-25 NOTE — Assessment & Plan Note (Signed)
Controlled, no change in medication DASH diet and commitment to daily physical activity for a minimum of 30 minutes discussed and encouraged, as a part of hypertension management. The importance of attaining a healthy weight is also discussed.  

## 2013-04-03 ENCOUNTER — Other Ambulatory Visit: Payer: Self-pay | Admitting: Family Medicine

## 2013-04-05 ENCOUNTER — Encounter: Payer: Self-pay | Admitting: Family Medicine

## 2013-04-05 ENCOUNTER — Other Ambulatory Visit: Payer: Self-pay | Admitting: Family Medicine

## 2013-04-11 ENCOUNTER — Other Ambulatory Visit: Payer: Self-pay | Admitting: Family Medicine

## 2013-04-18 ENCOUNTER — Ambulatory Visit (INDEPENDENT_AMBULATORY_CARE_PROVIDER_SITE_OTHER): Payer: Medicare Other | Admitting: Urology

## 2013-04-18 DIAGNOSIS — N3945 Continuous leakage: Secondary | ICD-10-CM | POA: Diagnosis not present

## 2013-04-18 DIAGNOSIS — R350 Frequency of micturition: Secondary | ICD-10-CM

## 2013-04-18 DIAGNOSIS — R3915 Urgency of urination: Secondary | ICD-10-CM

## 2013-05-10 DIAGNOSIS — E119 Type 2 diabetes mellitus without complications: Secondary | ICD-10-CM | POA: Diagnosis not present

## 2013-05-10 DIAGNOSIS — R5383 Other fatigue: Secondary | ICD-10-CM | POA: Diagnosis not present

## 2013-05-10 DIAGNOSIS — E785 Hyperlipidemia, unspecified: Secondary | ICD-10-CM | POA: Diagnosis not present

## 2013-05-10 DIAGNOSIS — R5381 Other malaise: Secondary | ICD-10-CM | POA: Diagnosis not present

## 2013-05-10 LAB — LIPID PANEL
LDL Cholesterol: 100 mg/dL — ABNORMAL HIGH (ref 0–99)
Triglycerides: 188 mg/dL — ABNORMAL HIGH (ref <150)
VLDL: 38 mg/dL (ref 0–40)

## 2013-05-10 LAB — COMPLETE METABOLIC PANEL WITH GFR
ALT: <8 U/L (ref 0–35)
AST: 14 U/L (ref 0–37)
Calcium: 9.9 mg/dL (ref 8.4–10.5)
Chloride: 103 mEq/L (ref 96–112)
Creat: 1.72 mg/dL — ABNORMAL HIGH (ref 0.50–1.10)
Total Bilirubin: 0.4 mg/dL (ref 0.3–1.2)

## 2013-05-10 LAB — HEMOGLOBIN A1C
Hgb A1c MFr Bld: 5.8 % — ABNORMAL HIGH (ref <5.7)
Mean Plasma Glucose: 120 mg/dL — ABNORMAL HIGH (ref <117)

## 2013-05-27 ENCOUNTER — Other Ambulatory Visit: Payer: Self-pay | Admitting: Family Medicine

## 2013-05-30 ENCOUNTER — Other Ambulatory Visit: Payer: Self-pay | Admitting: Family Medicine

## 2013-06-04 ENCOUNTER — Other Ambulatory Visit: Payer: Self-pay | Admitting: Family Medicine

## 2013-06-27 ENCOUNTER — Other Ambulatory Visit: Payer: Self-pay | Admitting: Family Medicine

## 2013-06-30 ENCOUNTER — Other Ambulatory Visit: Payer: Self-pay | Admitting: Family Medicine

## 2013-07-12 DIAGNOSIS — T8189XA Other complications of procedures, not elsewhere classified, initial encounter: Secondary | ICD-10-CM | POA: Diagnosis not present

## 2013-07-19 DIAGNOSIS — T8131XA Disruption of external operation (surgical) wound, not elsewhere classified, initial encounter: Secondary | ICD-10-CM | POA: Diagnosis not present

## 2013-07-26 DIAGNOSIS — T8131XA Disruption of external operation (surgical) wound, not elsewhere classified, initial encounter: Secondary | ICD-10-CM | POA: Diagnosis not present

## 2013-07-27 DIAGNOSIS — L98499 Non-pressure chronic ulcer of skin of other sites with unspecified severity: Secondary | ICD-10-CM | POA: Diagnosis not present

## 2013-07-27 DIAGNOSIS — E669 Obesity, unspecified: Secondary | ICD-10-CM | POA: Diagnosis not present

## 2013-07-27 DIAGNOSIS — E039 Hypothyroidism, unspecified: Secondary | ICD-10-CM | POA: Diagnosis not present

## 2013-07-27 DIAGNOSIS — F411 Generalized anxiety disorder: Secondary | ICD-10-CM | POA: Diagnosis not present

## 2013-07-27 DIAGNOSIS — M1990 Unspecified osteoarthritis, unspecified site: Secondary | ICD-10-CM | POA: Diagnosis not present

## 2013-07-27 DIAGNOSIS — E785 Hyperlipidemia, unspecified: Secondary | ICD-10-CM | POA: Diagnosis not present

## 2013-07-27 DIAGNOSIS — I10 Essential (primary) hypertension: Secondary | ICD-10-CM | POA: Diagnosis not present

## 2013-07-28 ENCOUNTER — Ambulatory Visit (INDEPENDENT_AMBULATORY_CARE_PROVIDER_SITE_OTHER): Payer: Medicare Other | Admitting: Family Medicine

## 2013-07-28 ENCOUNTER — Encounter: Payer: Self-pay | Admitting: Family Medicine

## 2013-07-28 VITALS — BP 106/60 | HR 78 | Resp 16 | Ht 62.0 in | Wt 216.0 lb

## 2013-07-28 DIAGNOSIS — L98499 Non-pressure chronic ulcer of skin of other sites with unspecified severity: Secondary | ICD-10-CM | POA: Diagnosis not present

## 2013-07-28 DIAGNOSIS — R32 Unspecified urinary incontinence: Secondary | ICD-10-CM | POA: Diagnosis not present

## 2013-07-28 DIAGNOSIS — F17200 Nicotine dependence, unspecified, uncomplicated: Secondary | ICD-10-CM | POA: Diagnosis not present

## 2013-07-28 DIAGNOSIS — I10 Essential (primary) hypertension: Secondary | ICD-10-CM | POA: Diagnosis not present

## 2013-07-28 DIAGNOSIS — F3289 Other specified depressive episodes: Secondary | ICD-10-CM | POA: Diagnosis not present

## 2013-07-28 DIAGNOSIS — E039 Hypothyroidism, unspecified: Secondary | ICD-10-CM | POA: Diagnosis not present

## 2013-07-28 DIAGNOSIS — R5381 Other malaise: Secondary | ICD-10-CM

## 2013-07-28 DIAGNOSIS — F411 Generalized anxiety disorder: Secondary | ICD-10-CM | POA: Diagnosis not present

## 2013-07-28 DIAGNOSIS — E669 Obesity, unspecified: Secondary | ICD-10-CM

## 2013-07-28 DIAGNOSIS — Z1239 Encounter for other screening for malignant neoplasm of breast: Secondary | ICD-10-CM | POA: Diagnosis not present

## 2013-07-28 DIAGNOSIS — Z9181 History of falling: Secondary | ICD-10-CM

## 2013-07-28 DIAGNOSIS — R296 Repeated falls: Secondary | ICD-10-CM | POA: Insufficient documentation

## 2013-07-28 DIAGNOSIS — E559 Vitamin D deficiency, unspecified: Secondary | ICD-10-CM

## 2013-07-28 DIAGNOSIS — F329 Major depressive disorder, single episode, unspecified: Secondary | ICD-10-CM | POA: Diagnosis not present

## 2013-07-28 DIAGNOSIS — Z23 Encounter for immunization: Secondary | ICD-10-CM

## 2013-07-28 DIAGNOSIS — M1990 Unspecified osteoarthritis, unspecified site: Secondary | ICD-10-CM | POA: Diagnosis not present

## 2013-07-28 DIAGNOSIS — E785 Hyperlipidemia, unspecified: Secondary | ICD-10-CM

## 2013-07-28 DIAGNOSIS — R7301 Impaired fasting glucose: Secondary | ICD-10-CM

## 2013-07-28 DIAGNOSIS — E538 Deficiency of other specified B group vitamins: Secondary | ICD-10-CM

## 2013-07-28 MED ORDER — SOLIFENACIN SUCCINATE 10 MG PO TABS: ORAL_TABLET | ORAL | Status: DC

## 2013-07-28 MED ORDER — BUPROPION HCL ER (XL) 300 MG PO TB24: 300.0000 mg | ORAL_TABLET | ORAL | Status: DC

## 2013-07-28 NOTE — Progress Notes (Signed)
  Subjective:    Patient ID: Tammy Suarez, female    DOB: 03/27/1946, 67 y.o.   MRN: [id number]  HPI The PT is here for follow up and re-evaluation of chronic medical conditions, medication management and review of any available recent lab and radiology data.  Preventive health is updated, specifically  Cancer screening and Immunization.   .Was seen by ne[hrology and BP med changed, however she went back to med she had previously been on, stating her BP was "high" The PT denies any adverse reactions to current medications since the last visit.  There are no new concerns. Continues to experience light headedness with recurrent falls,will go to PT for assessment and help C/o depression , not suicidal or homicidal, feels "blah" a lot due to weight gain she believes Cigarettes down to 2 per day, grudgingly decides  On quit date of October 1 States feels as though she needs higher med dose for incontinence symptoms, a she is still having wetting accidents, though less    Review of Systems See HPI Denies recent fever or chills. Denies sinus pressure, nasal congestion, ear pain or sore throat. Denies chest congestion, productive cough or wheezing. Denies chest pains, palpitations and leg swelling Denies abdominal pain, nausea, vomiting,diarrhea or constipation.   Denies dysuria, frequency, hesitancy uncontrolled  incontinence. Chronic back pain  and limitation in mobility. Denies headaches, seizures, numbness, or tingling. Worsened  Depression,denies  anxiety or insomnia.Not suicidal or homicidal Chronic wound of right lower abdominal fold, being treated at local wound center recent unsuccessful attempts at closure at [facility], this is contributing to her depression      Objective:   Physical Exam  Patient alert and oriented and in no cardiopulmonary distress.  HEENT: No facial asymmetry, EOMI, no sinus tenderness,  oropharynx pink and moist.  Neck deceased though adequate ROM, no  JVD  Chest: Clear to auscultation bilaterally.  CVS: S1, S2 no murmurs, no S3.  ABD: Soft non tender. Bowel sounds normal.  Ext: No edema  MS: decreased ROM thoracic and  spine, adequate inn shoulders, hips and knees  Skin: bandage over right lowe abdomen where pt has open wound being treated by wound center.  CNS: CN 2-12 intact, power,  normal throughout.       Assessment & Plan:

## 2013-07-28 NOTE — Patient Instructions (Addendum)
F/u in 4 to 5 weeks, call if you need me before  Gap to next visit  PLEASE plan to sTOP smoking by October 1, you will be healthier   STOP benazepril, blood pressure is low. OK to check at home, if you are concerned  That it is high, call to come in sooner . I do not expect this to happen  You are referred to physical therapy twice weekly for 4 weeks due to unsteady balance and recurrent falls, they will call you with appt   Increase dose of vesicare to 10mg  one daily (OK to take two 5mg  tabs daily till done)  Wellbutrin is changed to 300mg  one daily   Fasting CBC, lipid, cmp and EGFR, HBA1C tSH, vit D level, and vit B12 level before next visit  Stop at checkout for mammogram appointment please

## 2013-07-30 NOTE — Assessment & Plan Note (Signed)
Over corrected, with h/o recurrent falls and imbalance, d/c medication

## 2013-07-30 NOTE — Assessment & Plan Note (Signed)
Deteriorated. Patient re-educated about  the importance of commitment to a  minimum of 150 minutes of exercise per week. The importance of healthy food choices with portion control discussed. Encouraged to start a food diary, count calories and to consider  joining a support group. Sample diet sheets offered. Goals set by the patient for the next several months.    

## 2013-07-30 NOTE — Assessment & Plan Note (Signed)
Controlled, no change in medication  

## 2013-07-30 NOTE — Assessment & Plan Note (Signed)
Improved, states she wants to quit. Date set for October 1. Patient counseled for approximately 5 minutes regarding the health risks of ongoing nicotine use, specifically all types of cancer, heart disease, stroke and respiratory failure. The options available for help with cessation ,the behavioral changes to assist the process, and the option to either gradully reduce usage  Or abruptly stop.is also discussed. Pt is also encouraged to set specific goals in number of cigarettes used daily, as well as to set a quit date.

## 2013-07-30 NOTE — Assessment & Plan Note (Signed)
Improved, though still sub optimal control, increase visicare dose

## 2013-07-30 NOTE — Assessment & Plan Note (Signed)
Refer to PT for eval and treatment. D/c anti hypertensive

## 2013-07-30 NOTE — Assessment & Plan Note (Signed)
Uncontrolled Hyperlipidemia:Low fat diet discussed and encouraged.  Update lab next visit

## 2013-07-30 NOTE — Assessment & Plan Note (Signed)
rept level to be checked to determine need for supplement

## 2013-07-31 DIAGNOSIS — F411 Generalized anxiety disorder: Secondary | ICD-10-CM | POA: Diagnosis not present

## 2013-07-31 DIAGNOSIS — M1990 Unspecified osteoarthritis, unspecified site: Secondary | ICD-10-CM | POA: Diagnosis not present

## 2013-07-31 DIAGNOSIS — I10 Essential (primary) hypertension: Secondary | ICD-10-CM | POA: Diagnosis not present

## 2013-07-31 DIAGNOSIS — L98499 Non-pressure chronic ulcer of skin of other sites with unspecified severity: Secondary | ICD-10-CM | POA: Diagnosis not present

## 2013-07-31 DIAGNOSIS — E669 Obesity, unspecified: Secondary | ICD-10-CM | POA: Diagnosis not present

## 2013-07-31 DIAGNOSIS — E039 Hypothyroidism, unspecified: Secondary | ICD-10-CM | POA: Diagnosis not present

## 2013-08-02 DIAGNOSIS — M1990 Unspecified osteoarthritis, unspecified site: Secondary | ICD-10-CM | POA: Diagnosis not present

## 2013-08-02 DIAGNOSIS — E669 Obesity, unspecified: Secondary | ICD-10-CM | POA: Diagnosis not present

## 2013-08-02 DIAGNOSIS — L98499 Non-pressure chronic ulcer of skin of other sites with unspecified severity: Secondary | ICD-10-CM | POA: Diagnosis not present

## 2013-08-02 DIAGNOSIS — T8131XA Disruption of external operation (surgical) wound, not elsewhere classified, initial encounter: Secondary | ICD-10-CM | POA: Diagnosis not present

## 2013-08-02 DIAGNOSIS — E039 Hypothyroidism, unspecified: Secondary | ICD-10-CM | POA: Diagnosis not present

## 2013-08-02 DIAGNOSIS — F411 Generalized anxiety disorder: Secondary | ICD-10-CM | POA: Diagnosis not present

## 2013-08-02 DIAGNOSIS — I10 Essential (primary) hypertension: Secondary | ICD-10-CM | POA: Diagnosis not present

## 2013-08-04 ENCOUNTER — Ambulatory Visit (HOSPITAL_COMMUNITY)
Admission: RE | Admit: 2013-08-04 | Discharge: 2013-08-04 | Disposition: A | Discharge status: Home / Self Care | Payer: Medicare Other | Source: Ambulatory Visit | Attending: Family Medicine | Admitting: Family Medicine

## 2013-08-04 DIAGNOSIS — L98499 Non-pressure chronic ulcer of skin of other sites with unspecified severity: Secondary | ICD-10-CM | POA: Diagnosis not present

## 2013-08-04 DIAGNOSIS — Z1231 Encounter for screening mammogram for malignant neoplasm of breast: Secondary | ICD-10-CM | POA: Insufficient documentation

## 2013-08-04 DIAGNOSIS — Z1239 Encounter for other screening for malignant neoplasm of breast: Secondary | ICD-10-CM

## 2013-08-04 DIAGNOSIS — F411 Generalized anxiety disorder: Secondary | ICD-10-CM | POA: Diagnosis not present

## 2013-08-04 DIAGNOSIS — E669 Obesity, unspecified: Secondary | ICD-10-CM | POA: Diagnosis not present

## 2013-08-04 DIAGNOSIS — I10 Essential (primary) hypertension: Secondary | ICD-10-CM | POA: Diagnosis not present

## 2013-08-04 DIAGNOSIS — E039 Hypothyroidism, unspecified: Secondary | ICD-10-CM | POA: Diagnosis not present

## 2013-08-04 DIAGNOSIS — M1990 Unspecified osteoarthritis, unspecified site: Secondary | ICD-10-CM | POA: Diagnosis not present

## 2013-08-07 DIAGNOSIS — E669 Obesity, unspecified: Secondary | ICD-10-CM | POA: Diagnosis not present

## 2013-08-07 DIAGNOSIS — E039 Hypothyroidism, unspecified: Secondary | ICD-10-CM | POA: Diagnosis not present

## 2013-08-07 DIAGNOSIS — I10 Essential (primary) hypertension: Secondary | ICD-10-CM | POA: Diagnosis not present

## 2013-08-07 DIAGNOSIS — F411 Generalized anxiety disorder: Secondary | ICD-10-CM | POA: Diagnosis not present

## 2013-08-07 DIAGNOSIS — L98499 Non-pressure chronic ulcer of skin of other sites with unspecified severity: Secondary | ICD-10-CM | POA: Diagnosis not present

## 2013-08-07 DIAGNOSIS — M1990 Unspecified osteoarthritis, unspecified site: Secondary | ICD-10-CM | POA: Diagnosis not present

## 2013-08-09 DIAGNOSIS — E669 Obesity, unspecified: Secondary | ICD-10-CM | POA: Diagnosis not present

## 2013-08-09 DIAGNOSIS — I10 Essential (primary) hypertension: Secondary | ICD-10-CM | POA: Diagnosis not present

## 2013-08-09 DIAGNOSIS — F411 Generalized anxiety disorder: Secondary | ICD-10-CM | POA: Diagnosis not present

## 2013-08-09 DIAGNOSIS — M1990 Unspecified osteoarthritis, unspecified site: Secondary | ICD-10-CM | POA: Diagnosis not present

## 2013-08-09 DIAGNOSIS — L98499 Non-pressure chronic ulcer of skin of other sites with unspecified severity: Secondary | ICD-10-CM | POA: Diagnosis not present

## 2013-08-09 DIAGNOSIS — E039 Hypothyroidism, unspecified: Secondary | ICD-10-CM | POA: Diagnosis not present

## 2013-08-14 ENCOUNTER — Other Ambulatory Visit: Payer: Self-pay | Admitting: Family Medicine

## 2013-08-16 DIAGNOSIS — T8131XA Disruption of external operation (surgical) wound, not elsewhere classified, initial encounter: Secondary | ICD-10-CM | POA: Diagnosis not present

## 2013-08-16 DIAGNOSIS — T8130XA Disruption of wound, unspecified, initial encounter: Secondary | ICD-10-CM | POA: Diagnosis not present

## 2013-08-17 DIAGNOSIS — I10 Essential (primary) hypertension: Secondary | ICD-10-CM | POA: Diagnosis not present

## 2013-08-17 DIAGNOSIS — E039 Hypothyroidism, unspecified: Secondary | ICD-10-CM | POA: Diagnosis not present

## 2013-08-17 DIAGNOSIS — F411 Generalized anxiety disorder: Secondary | ICD-10-CM | POA: Diagnosis not present

## 2013-08-17 DIAGNOSIS — E669 Obesity, unspecified: Secondary | ICD-10-CM | POA: Diagnosis not present

## 2013-08-17 DIAGNOSIS — L98499 Non-pressure chronic ulcer of skin of other sites with unspecified severity: Secondary | ICD-10-CM | POA: Diagnosis not present

## 2013-08-17 DIAGNOSIS — M1990 Unspecified osteoarthritis, unspecified site: Secondary | ICD-10-CM | POA: Diagnosis not present

## 2013-08-22 DIAGNOSIS — I10 Essential (primary) hypertension: Secondary | ICD-10-CM | POA: Diagnosis not present

## 2013-08-22 DIAGNOSIS — F411 Generalized anxiety disorder: Secondary | ICD-10-CM | POA: Diagnosis not present

## 2013-08-22 DIAGNOSIS — M1990 Unspecified osteoarthritis, unspecified site: Secondary | ICD-10-CM | POA: Diagnosis not present

## 2013-08-22 DIAGNOSIS — L98499 Non-pressure chronic ulcer of skin of other sites with unspecified severity: Secondary | ICD-10-CM | POA: Diagnosis not present

## 2013-08-22 DIAGNOSIS — E039 Hypothyroidism, unspecified: Secondary | ICD-10-CM | POA: Diagnosis not present

## 2013-08-22 DIAGNOSIS — E669 Obesity, unspecified: Secondary | ICD-10-CM | POA: Diagnosis not present

## 2013-08-23 ENCOUNTER — Telehealth: Payer: Self-pay | Admitting: Family Medicine

## 2013-08-23 ENCOUNTER — Other Ambulatory Visit: Payer: Self-pay | Admitting: Family Medicine

## 2013-08-23 NOTE — Telephone Encounter (Signed)
Please advise 

## 2013-08-23 NOTE — Telephone Encounter (Signed)
pls document hx, location, duration and severity of symptoms

## 2013-08-23 NOTE — Telephone Encounter (Signed)
Spoke with patietn and she states that it is her right foot and started x 1 day ago.  She is having pain on the top of foot.

## 2013-08-24 ENCOUNTER — Telehealth: Payer: Self-pay | Admitting: Family Medicine

## 2013-08-24 ENCOUNTER — Other Ambulatory Visit: Payer: Self-pay | Admitting: Family Medicine

## 2013-08-24 MED ORDER — PREDNISONE 5 MG PO TABS: 5.0000 mg | ORAL_TABLET | Freq: Two times a day (BID) | ORAL | Status: AC

## 2013-08-24 MED ORDER — INDOMETHACIN 25 MG PO CAPS: ORAL_CAPSULE | ORAL | Status: DC

## 2013-08-24 NOTE — Telephone Encounter (Signed)
See message, if pt call about medication for goout it has been sent to her pharmacy

## 2013-08-24 NOTE — Telephone Encounter (Signed)
Noted  

## 2013-08-24 NOTE — Telephone Encounter (Signed)
Will refax clarifying directions

## 2013-08-24 NOTE — Telephone Encounter (Signed)
Medication has been sent in for her gout flare  I tried to speak with her at lunch, whoever answered hung up the phone, please let her know

## 2013-08-25 NOTE — Telephone Encounter (Signed)
Called patient no answer and could not leave a message.

## 2013-08-26 ENCOUNTER — Other Ambulatory Visit: Payer: Self-pay | Admitting: Family Medicine

## 2013-08-28 ENCOUNTER — Other Ambulatory Visit: Payer: Self-pay

## 2013-08-28 MED ORDER — HYDROCODONE-ACETAMINOPHEN 10-325 MG PO TABS: 1.0000 | ORAL_TABLET | Freq: Four times a day (QID) | ORAL | Status: DC

## 2013-08-29 ENCOUNTER — Other Ambulatory Visit: Payer: Self-pay | Admitting: Family Medicine

## 2013-08-29 DIAGNOSIS — I10 Essential (primary) hypertension: Secondary | ICD-10-CM | POA: Diagnosis not present

## 2013-08-29 DIAGNOSIS — E559 Vitamin D deficiency, unspecified: Secondary | ICD-10-CM | POA: Diagnosis not present

## 2013-08-29 DIAGNOSIS — R5383 Other fatigue: Secondary | ICD-10-CM | POA: Diagnosis not present

## 2013-08-29 DIAGNOSIS — R413 Other amnesia: Secondary | ICD-10-CM | POA: Diagnosis not present

## 2013-08-29 DIAGNOSIS — R5381 Other malaise: Secondary | ICD-10-CM | POA: Diagnosis not present

## 2013-08-29 DIAGNOSIS — E785 Hyperlipidemia, unspecified: Secondary | ICD-10-CM | POA: Diagnosis not present

## 2013-08-29 DIAGNOSIS — R7301 Impaired fasting glucose: Secondary | ICD-10-CM | POA: Diagnosis not present

## 2013-08-30 DIAGNOSIS — T8131XA Disruption of external operation (surgical) wound, not elsewhere classified, initial encounter: Secondary | ICD-10-CM | POA: Diagnosis not present

## 2013-08-30 LAB — CBC WITH DIFFERENTIAL/PLATELET
Basophils Absolute: 0 10*3/uL (ref 0.0–0.1)
Basophils Relative: 0 % (ref 0–1)
Eosinophils Relative: 5 % (ref 0–5)
HCT: 37.1 % (ref 36.0–46.0)
MCH: 31.1 pg (ref 26.0–34.0)
MCHC: 33.4 g/dL (ref 30.0–36.0)
MCV: 93 fL (ref 78.0–100.0)
Monocytes Absolute: 0.5 10*3/uL (ref 0.1–1.0)
RDW: 13.2 % (ref 11.5–15.5)

## 2013-08-30 LAB — COMPLETE METABOLIC PANEL WITH GFR
ALT: <8 U/L (ref 0–35)
AST: 15 U/L (ref 0–37)
CO2: 26 mEq/L (ref 19–32)
Chloride: 100 mEq/L (ref 96–112)
GFR, Est African American: 32 mL/min — ABNORMAL LOW
Sodium: 135 mEq/L (ref 135–145)
Total Bilirubin: 0.4 mg/dL (ref 0.3–1.2)
Total Protein: 7 g/dL (ref 6.0–8.3)

## 2013-08-30 LAB — LIPID PANEL
HDL: 33 mg/dL — ABNORMAL LOW (ref >39)
LDL Cholesterol: 75 mg/dL (ref 0–99)
VLDL: 54 mg/dL — ABNORMAL HIGH (ref 0–40)

## 2013-08-30 LAB — TSH: TSH: 5.044 u[IU]/mL — ABNORMAL HIGH (ref 0.350–4.500)

## 2013-08-30 LAB — VITAMIN D 25 HYDROXY (VIT D DEFICIENCY, FRACTURES): Vit D, 25-Hydroxy: 32 ng/mL (ref 30–89)

## 2013-09-08 ENCOUNTER — Other Ambulatory Visit: Payer: Self-pay

## 2013-09-08 MED ORDER — FENOFIBRIC ACID 135 MG PO CPDR: 1.0000 | DELAYED_RELEASE_CAPSULE | Freq: Every day | ORAL | Status: DC

## 2013-09-11 ENCOUNTER — Other Ambulatory Visit: Payer: Self-pay | Admitting: Family Medicine

## 2013-09-11 DIAGNOSIS — M25519 Pain in unspecified shoulder: Secondary | ICD-10-CM | POA: Diagnosis not present

## 2013-09-11 DIAGNOSIS — R52 Pain, unspecified: Secondary | ICD-10-CM | POA: Diagnosis not present

## 2013-09-11 DIAGNOSIS — I10 Essential (primary) hypertension: Secondary | ICD-10-CM | POA: Diagnosis not present

## 2013-09-15 ENCOUNTER — Encounter (INDEPENDENT_AMBULATORY_CARE_PROVIDER_SITE_OTHER): Payer: Self-pay

## 2013-09-15 ENCOUNTER — Encounter: Payer: Self-pay | Admitting: Family Medicine

## 2013-09-15 ENCOUNTER — Other Ambulatory Visit: Payer: Self-pay

## 2013-09-15 ENCOUNTER — Ambulatory Visit (INDEPENDENT_AMBULATORY_CARE_PROVIDER_SITE_OTHER): Payer: Medicare Other | Admitting: Family Medicine

## 2013-09-15 VITALS — BP 124/78 | HR 87 | Resp 16 | Ht 62.0 in | Wt 212.0 lb

## 2013-09-15 DIAGNOSIS — E1310 Other specified diabetes mellitus with ketoacidosis without coma: Secondary | ICD-10-CM

## 2013-09-15 DIAGNOSIS — E1129 Type 2 diabetes mellitus with other diabetic kidney complication: Secondary | ICD-10-CM | POA: Insufficient documentation

## 2013-09-15 DIAGNOSIS — E669 Obesity, unspecified: Secondary | ICD-10-CM

## 2013-09-15 DIAGNOSIS — I10 Essential (primary) hypertension: Secondary | ICD-10-CM | POA: Diagnosis not present

## 2013-09-15 DIAGNOSIS — R32 Unspecified urinary incontinence: Secondary | ICD-10-CM

## 2013-09-15 DIAGNOSIS — Z13820 Encounter for screening for osteoporosis: Secondary | ICD-10-CM

## 2013-09-15 DIAGNOSIS — E119 Type 2 diabetes mellitus without complications: Secondary | ICD-10-CM

## 2013-09-15 DIAGNOSIS — E785 Hyperlipidemia, unspecified: Secondary | ICD-10-CM

## 2013-09-15 DIAGNOSIS — F17200 Nicotine dependence, unspecified, uncomplicated: Secondary | ICD-10-CM

## 2013-09-15 DIAGNOSIS — E039 Hypothyroidism, unspecified: Secondary | ICD-10-CM

## 2013-09-15 DIAGNOSIS — E1110 Type 2 diabetes mellitus with ketoacidosis without coma: Secondary | ICD-10-CM

## 2013-09-15 DIAGNOSIS — M549 Dorsalgia, unspecified: Secondary | ICD-10-CM

## 2013-09-15 MED ORDER — CLONAZEPAM 1 MG PO TABS: ORAL_TABLET | ORAL | Status: DC

## 2013-09-15 MED ORDER — AMITRIPTYLINE HCL 25 MG PO TABS: ORAL_TABLET | ORAL | Status: DC

## 2013-09-15 MED ORDER — LORATADINE 10 MG PO TABS: 10.0000 mg | ORAL_TABLET | Freq: Every day | ORAL | Status: DC

## 2013-09-15 MED ORDER — LEVOTHYROXINE SODIUM 50 MCG PO TABS: 50.0000 ug | ORAL_TABLET | Freq: Every day | ORAL | Status: DC

## 2013-09-15 MED ORDER — DULOXETINE HCL 60 MG PO CPEP: ORAL_CAPSULE | ORAL | Status: DC

## 2013-09-15 MED ORDER — BENAZEPRIL HCL 20 MG PO TABS: 20.0000 mg | ORAL_TABLET | Freq: Every day | ORAL | Status: DC

## 2013-09-15 NOTE — Patient Instructions (Addendum)
F/u in  3.5 month, call if you need me before  Microalb in office today  Cut back on sweets and carbs, sugar is up  Cut back on candy, triglycerides are high  You are referred for eye exam to Dr. Gershon Crane  NEW dose of benazepril is 20 mg one daily  Fasting lipid, cmp and EGFr, hBA1Cand TSH in 3.5 month  You are referred for a bone density test

## 2013-09-15 NOTE — Progress Notes (Signed)
  Subjective:    Patient ID: Tammy Suarez, female    DOB: 11-01-1946, 67 y.o.   MRN: [id number]  HPI The PT is here for follow up and re-evaluation of chronic medical conditions, medication management and review of any available recent lab and radiology data. At last visit hypertension med was adjusted and she is here to follow up on thi in particular Preventive health is updated, specifically  Cancer screening and Immunization.    The PT denies any adverse reactions to current medications since the last visit.  There are no new concerns.  There are no specific complaints       Review of Systems See HPI Denies recent fever or chills. Denies sinus pressure, nasal congestion, ear pain or sore throat. Denies chest congestion, productive cough or wheezing. Denies chest pains, palpitations and leg swelling Denies abdominal pain, nausea, vomiting,diarrhea or constipation.   Denies dysuria, frequency, hesitancy or incontinence. Chronic back pain  and limitation in mobility. Denies headaches, seizures, numbness, or tingling. Denies depression, c/o stress and anxiety, her 2 adult daughters live with her alomg with grandchildren , and she is the only income earner Denies skin break down or rash.        Objective:   Physical Exam  Patient alert and oriented and in no cardiopulmonary distress.  HEENT: No facial asymmetry, EOMI, no sinus tenderness,  oropharynx pink and moist.  Neck supple no adenopathy.  Chest: Clear to auscultation bilaterally.  CVS: S1, S2 no murmurs, no S3.  ABD: Soft non tender. Bowel sounds normal.  Ext: No edema  MS: decrease  ROM spine, shoulders, hips and knees.  Skin: Intact, no ulcerations or rash noted.  Psych: Good eye contact, normal affect. Memory intact not anxious or depressed appearing.  CNS: CN 2-12 intact, power, tone and sensation normal throughout.       Assessment & Plan:

## 2013-09-16 ENCOUNTER — Encounter: Payer: Self-pay | Admitting: Family Medicine

## 2013-09-16 LAB — MICROALBUMIN / CREATININE URINE RATIO: Microalb, Ur: 0.61 mg/dL (ref 0.00–1.89)

## 2013-09-16 NOTE — Assessment & Plan Note (Signed)
Triglycerides elevated, dietary change only No med dose change

## 2013-09-16 NOTE — Assessment & Plan Note (Signed)
Ongoing nicotine use Patient counseled for approximately 5 minutes regarding the health risks of ongoing nicotine use, specifically all types of cancer, heart disease, stroke and respiratory failure. The options available for help with cessation ,the behavioral changes to assist the process, and the option to either gradully reduce usage  Or abruptly stop.is also discussed. Pt is also encouraged to set specific goals in number of cigarettes used daily, as well as to set a quit date.

## 2013-09-16 NOTE — Assessment & Plan Note (Signed)
Deteriorated. Patient re-educated about  the importance of commitment to a  minimum of 150 minutes of exercise per week. The importance of healthy food choices with portion control discussed. Encouraged to start a food diary, count calories and to consider  joining a support group. Sample diet sheets offered. Goals set by the patient for the next several months.    

## 2013-09-16 NOTE — Assessment & Plan Note (Signed)
Controlled, no change in medication  

## 2013-09-16 NOTE — Assessment & Plan Note (Signed)
Controlled, no change in medication DASH diet and commitment to daily physical activity for a minimum of 30 minutes discussed and encouraged, as a part of hypertension management. The importance of attaining a healthy weight is also discussed.  

## 2013-09-16 NOTE — Assessment & Plan Note (Signed)
Unchanged, medication unchanged

## 2013-09-16 NOTE — Assessment & Plan Note (Signed)
Reports good response to vesicsare 10mg  continue same

## 2013-09-16 NOTE — Assessment & Plan Note (Signed)
Deteriorated, pt to remain diet controlled only, and is encouraged strongly to reduce carb and sweet intake alos to commit to weight loss and physical activity

## 2013-09-18 ENCOUNTER — Other Ambulatory Visit: Payer: Self-pay | Admitting: Family Medicine

## 2013-09-19 ENCOUNTER — Other Ambulatory Visit: Payer: Self-pay | Admitting: Family Medicine

## 2013-09-29 ENCOUNTER — Other Ambulatory Visit: Payer: Self-pay

## 2013-09-29 MED ORDER — HYDROCODONE-ACETAMINOPHEN 10-325 MG PO TABS: 1.0000 | ORAL_TABLET | Freq: Four times a day (QID) | ORAL | Status: DC

## 2013-10-02 ENCOUNTER — Other Ambulatory Visit: Payer: Self-pay | Admitting: Family Medicine

## 2013-10-04 DIAGNOSIS — T8130XA Disruption of wound, unspecified, initial encounter: Secondary | ICD-10-CM | POA: Diagnosis not present

## 2013-10-04 DIAGNOSIS — T8131XA Disruption of external operation (surgical) wound, not elsewhere classified, initial encounter: Secondary | ICD-10-CM | POA: Diagnosis not present

## 2013-10-09 ENCOUNTER — Other Ambulatory Visit: Payer: Self-pay

## 2013-10-09 DIAGNOSIS — R32 Unspecified urinary incontinence: Secondary | ICD-10-CM

## 2013-10-09 DIAGNOSIS — F3289 Other specified depressive episodes: Secondary | ICD-10-CM

## 2013-10-09 DIAGNOSIS — F329 Major depressive disorder, single episode, unspecified: Secondary | ICD-10-CM

## 2013-10-09 MED ORDER — BUPROPION HCL ER (XL) 300 MG PO TB24: 300.0000 mg | ORAL_TABLET | ORAL | Status: DC

## 2013-10-09 MED ORDER — RANITIDINE HCL 150 MG PO TABS: ORAL_TABLET | ORAL | Status: DC

## 2013-10-09 MED ORDER — SIMVASTATIN 20 MG PO TABS: ORAL_TABLET | ORAL | Status: DC

## 2013-10-09 MED ORDER — SOLIFENACIN SUCCINATE 10 MG PO TABS: ORAL_TABLET | ORAL | Status: DC

## 2013-10-10 ENCOUNTER — Ambulatory Visit (HOSPITAL_COMMUNITY)
Admission: RE | Admit: 2013-10-10 | Discharge: 2013-10-10 | Disposition: A | Discharge status: Home / Self Care | Payer: Medicare Other | Source: Ambulatory Visit | Attending: Family Medicine | Admitting: Family Medicine

## 2013-10-10 DIAGNOSIS — Z780 Asymptomatic menopausal state: Secondary | ICD-10-CM | POA: Diagnosis not present

## 2013-10-10 DIAGNOSIS — Z13820 Encounter for screening for osteoporosis: Secondary | ICD-10-CM

## 2013-10-27 ENCOUNTER — Emergency Department (HOSPITAL_COMMUNITY): Payer: Medicare Other

## 2013-10-27 ENCOUNTER — Encounter (HOSPITAL_COMMUNITY): Payer: Self-pay | Admitting: Emergency Medicine

## 2013-10-27 ENCOUNTER — Inpatient Hospital Stay (HOSPITAL_COMMUNITY)
Admission: EM | Admit: 2013-10-27 | Discharge: 2013-11-06 | DRG: 234 | Disposition: A | Discharge status: Home / Self Care | Payer: Medicare Other | Attending: Surgery | Admitting: Surgery

## 2013-10-27 ENCOUNTER — Other Ambulatory Visit: Payer: Self-pay

## 2013-10-27 DIAGNOSIS — M549 Dorsalgia, unspecified: Secondary | ICD-10-CM | POA: Diagnosis present

## 2013-10-27 DIAGNOSIS — I214 Non-ST elevation (NSTEMI) myocardial infarction: Secondary | ICD-10-CM | POA: Diagnosis not present

## 2013-10-27 DIAGNOSIS — I2510 Atherosclerotic heart disease of native coronary artery without angina pectoris: Secondary | ICD-10-CM

## 2013-10-27 DIAGNOSIS — N058 Unspecified nephritic syndrome with other morphologic changes: Secondary | ICD-10-CM | POA: Diagnosis not present

## 2013-10-27 DIAGNOSIS — J9819 Other pulmonary collapse: Secondary | ICD-10-CM | POA: Diagnosis present

## 2013-10-27 DIAGNOSIS — R05 Cough: Secondary | ICD-10-CM

## 2013-10-27 DIAGNOSIS — E1129 Type 2 diabetes mellitus with other diabetic kidney complication: Secondary | ICD-10-CM | POA: Diagnosis not present

## 2013-10-27 DIAGNOSIS — Z01810 Encounter for preprocedural cardiovascular examination: Secondary | ICD-10-CM | POA: Diagnosis not present

## 2013-10-27 DIAGNOSIS — E119 Type 2 diabetes mellitus without complications: Secondary | ICD-10-CM | POA: Diagnosis present

## 2013-10-27 DIAGNOSIS — E785 Hyperlipidemia, unspecified: Secondary | ICD-10-CM | POA: Diagnosis present

## 2013-10-27 DIAGNOSIS — F17200 Nicotine dependence, unspecified, uncomplicated: Secondary | ICD-10-CM | POA: Diagnosis not present

## 2013-10-27 DIAGNOSIS — Z8249 Family history of ischemic heart disease and other diseases of the circulatory system: Secondary | ICD-10-CM

## 2013-10-27 DIAGNOSIS — K219 Gastro-esophageal reflux disease without esophagitis: Secondary | ICD-10-CM | POA: Diagnosis not present

## 2013-10-27 DIAGNOSIS — E669 Obesity, unspecified: Secondary | ICD-10-CM | POA: Diagnosis present

## 2013-10-27 DIAGNOSIS — R918 Other nonspecific abnormal finding of lung field: Secondary | ICD-10-CM | POA: Diagnosis not present

## 2013-10-27 DIAGNOSIS — N189 Chronic kidney disease, unspecified: Secondary | ICD-10-CM | POA: Diagnosis not present

## 2013-10-27 DIAGNOSIS — E039 Hypothyroidism, unspecified: Secondary | ICD-10-CM | POA: Diagnosis present

## 2013-10-27 DIAGNOSIS — Z7982 Long term (current) use of aspirin: Secondary | ICD-10-CM | POA: Diagnosis not present

## 2013-10-27 DIAGNOSIS — R072 Precordial pain: Secondary | ICD-10-CM | POA: Diagnosis not present

## 2013-10-27 DIAGNOSIS — R059 Cough, unspecified: Secondary | ICD-10-CM

## 2013-10-27 DIAGNOSIS — I129 Hypertensive chronic kidney disease with stage 1 through stage 4 chronic kidney disease, or unspecified chronic kidney disease: Secondary | ICD-10-CM | POA: Diagnosis present

## 2013-10-27 DIAGNOSIS — E538 Deficiency of other specified B group vitamins: Secondary | ICD-10-CM | POA: Diagnosis present

## 2013-10-27 DIAGNOSIS — I10 Essential (primary) hypertension: Secondary | ICD-10-CM | POA: Diagnosis not present

## 2013-10-27 DIAGNOSIS — E8779 Other fluid overload: Secondary | ICD-10-CM | POA: Diagnosis not present

## 2013-10-27 DIAGNOSIS — R079 Chest pain, unspecified: Secondary | ICD-10-CM | POA: Diagnosis not present

## 2013-10-27 DIAGNOSIS — G8929 Other chronic pain: Secondary | ICD-10-CM | POA: Diagnosis present

## 2013-10-27 DIAGNOSIS — D62 Acute posthemorrhagic anemia: Secondary | ICD-10-CM | POA: Diagnosis not present

## 2013-10-27 DIAGNOSIS — R0989 Other specified symptoms and signs involving the circulatory and respiratory systems: Secondary | ICD-10-CM | POA: Diagnosis not present

## 2013-10-27 DIAGNOSIS — Z9884 Bariatric surgery status: Secondary | ICD-10-CM | POA: Diagnosis not present

## 2013-10-27 DIAGNOSIS — Z9889 Other specified postprocedural states: Secondary | ICD-10-CM | POA: Diagnosis not present

## 2013-10-27 LAB — CBC
HCT: 36 % (ref 36.0–46.0)
Hemoglobin: 12 g/dL (ref 12.0–15.0)
MCV: 95 fL (ref 78.0–100.0)
Platelets: 329 10*3/uL (ref 150–400)
RDW: 12.8 % (ref 11.5–15.5)
WBC: 7.9 10*3/uL (ref 4.0–10.5)

## 2013-10-27 LAB — POCT I-STAT TROPONIN I

## 2013-10-27 LAB — POCT I-STAT, CHEM 8
Calcium, Ion: 1.3 mmol/L (ref 1.13–1.30)
Creatinine, Ser: 2 mg/dL — ABNORMAL HIGH (ref 0.50–1.10)
Glucose, Bld: 195 mg/dL — ABNORMAL HIGH (ref 70–99)
HCT: 37 % (ref 36.0–46.0)
Hemoglobin: 12.6 g/dL (ref 12.0–15.0)
Potassium: 4.5 mEq/L (ref 3.5–5.1)
TCO2: 25 mmol/L (ref 0–100)

## 2013-10-27 MED ORDER — HYDROCODONE-ACETAMINOPHEN 10-325 MG PO TABS: 1.0000 | ORAL_TABLET | Freq: Four times a day (QID) | ORAL | Status: DC

## 2013-10-27 MED ORDER — NITROGLYCERIN IN D5W 200-5 MCG/ML-% IV SOLN
5.0000 ug/min | Freq: Once | INTRAVENOUS | Status: AC
  Administered 2013-10-28: 5 ug/min via INTRAVENOUS
  Filled 2013-10-27: qty 250

## 2013-10-27 MED ORDER — ASPIRIN 81 MG PO CHEW
324.0000 mg | CHEWABLE_TABLET | Freq: Once | ORAL | Status: AC
  Administered 2013-10-27: 324 mg via ORAL
  Filled 2013-10-27: qty 4

## 2013-10-27 MED ORDER — NITROGLYCERIN 0.4 MG SL SUBL
0.4000 mg | SUBLINGUAL_TABLET | SUBLINGUAL | Status: DC | PRN
  Administered 2013-10-27: 0.4 mg via SUBLINGUAL
  Filled 2013-10-27: qty 25

## 2013-10-27 MED ORDER — HEPARIN (PORCINE) IN NACL 100-0.45 UNIT/ML-% IJ SOLN
12.0000 [IU]/kg/h | INTRAMUSCULAR | Status: DC
  Administered 2013-10-28: 12 [IU]/kg/h via INTRAVENOUS
  Filled 2013-10-27: qty 250

## 2013-10-27 MED ORDER — HEPARIN BOLUS VIA INFUSION
4000.0000 [IU] | Freq: Once | INTRAVENOUS | Status: AC
  Administered 2013-10-28: 4000 [IU] via INTRAVENOUS

## 2013-10-27 NOTE — ED Provider Notes (Signed)
CSN: [id number]     Arrival date & time 10/27/13  2241 History  This chart was scribed for [name], MD by Roxan Diesel, ED scribe.  This patient was seen in room APA05/APA05 and the patient's care was started at 11:00 PM.   Chief Complaint  Patient presents with  . Chest Pain    The history is provided by the patient and a relative. No language interpreter was used.    HPI Comments: Tammy Suarez is a 67 y.o. female with h/o DM, HTN and hyperlipidemia who presents to the Emergency Department complaining of 3 days of intermittent left-sided chest pain radiating into the left side of her jaw and her left arm with associated exertional dyspnea.  Pt describes pain as "burning."  It radiates into left shoulder and left side of jaw.  It does not radiate into back.  She states that pain initially came on while she was laying in bed at night.  Since then her pain has been coming and going.  She states that it goes away during the day and comes on and worsens at night when lying down.  However it is often present somewhat during the day and at night is not completely relieved by getting up and walking around.  Pt also notes that her jaw hurts more and she becomes SOB when she walks although she denies worsened arm pain when walking.  She denies nausea or diaphoresis.  She did not take aspirin today.  She denies ever taking nitroglycerin.  Pt's family also reports that for the past several days pt has been intermittently "stumbling" and "walking sideways."  Daughter states that pt is generally walking normally but will occasionally "stumble" as if she is drunk.  Pt denies prior h/o similar CP.  She admits to prior h/o similar weakness"a while" ago but does not know what caused it.  She denies h/o DVT/PE.  She denies h/o heart disease.  Pt states that she does not have diabetes anymore since she "lost some weight."   She denies h/o stroke or liver problems.    PCP: Tula Nakayama, MD        Past Medical History  Diagnosis Date  . Vitamin B 12 deficiency   . Chronic back pain   . GERD (gastroesophageal reflux disease)   . Nicotine addiction   . Hyperlipidemia   . Obesity   . Hypertension   . Type 2 diabetes mellitus without complications   . Conjunctivitis, acute     Past Surgical History  Procedure Laterality Date  . Cervical laminectomy    . Cholecystectomy    . Inguinal herniorrhaphy    . Vesicovaginal fistula closure w/ tah    . Lumbar fusion    . Left index finger repair    . Laparoscopic gastric banding  2009  . Cosmetic surgery  07/01/2012    recurrent skin infection on lower abdomen, [facility]  . Abdominal hysterectomy    . Spine surgery      Family History  Problem Relation Age of Onset  . Heart attack Mother   . Heart attack Father   . Hypertension Sister   . Heart attack Sister   . Cancer Brother     renal cell     History  Substance Use Topics  . Smoking status: Current Some Day Smoker -- 0.25 packs/day    Last Attempt to Quit: 05/08/2012  . Smokeless tobacco: Not on file     Comment: quit for  3 weeks   . Alcohol Use: No    OB History   Grav Para Term Preterm Abortions TAB SAB Ect Mult Living                  Review of Systems  All other systems reviewed and are negative.     Allergies  Meperidine hcl; Niacin; and Propoxyphene-acetaminophen  Home Medications   Current Outpatient Rx  Name  Route  Sig  Dispense  Refill  . amitriptyline (ELAVIL) 25 MG tablet      TAKE 1 TABLET BY MOUTH AT BEDTIME   30 tablet   1   . amLODipine (NORVASC) 5 MG tablet   Oral   Take 5 mg by mouth daily.         Marland Kitchen aspirin (BAYER ASPIRIN EC LOW DOSE) 81 MG EC tablet   Oral   Take 81 mg by mouth daily.           . benazepril (LOTENSIN) 20 MG tablet   Oral   Take 1 tablet (20 mg total) by mouth daily.   30 tablet   5     Dose reduction effective 09/15/2013. Pt will take ...   . buPROPion (WELLBUTRIN XL) 300 MG 24 hr  tablet   Oral   Take 1 tablet (300 mg total) by mouth every morning.   90 tablet   1     Discontinue wellbutrin 150mg  tablet effective 09/1 ...   . Choline Fenofibrate (FENOFIBRIC ACID) 135 MG CPDR      TAKE 1 CAPSULE EVERY DAY   30 capsule   1   . clonazePAM (KLONOPIN) 1 MG tablet      TAKE 1 TABLET BY MOUTH 3 TIMES A DAY   90 tablet   3   . DULoxetine (CYMBALTA) 60 MG capsule      TAKE ONE CAPSULE BY MOUTH TWICE A DAY   60 capsule   3   . fish oil-omega-3 fatty acids 1000 MG capsule   Oral   Take 2 g by mouth daily.           Marland Kitchen HYDROcodone-acetaminophen (NORCO) 10-325 MG per tablet   Oral   Take 1 tablet by mouth 4 (four) times daily.   120 tablet   0     30 day supply   . levothyroxine (SYNTHROID, LEVOTHROID) 50 MCG tablet   Oral   Take 1 tablet (50 mcg total) by mouth daily.   30 tablet   3   . loratadine (CLARITIN) 10 MG tablet   Oral   Take 1 tablet (10 mg total) by mouth daily.   30 tablet   3   . naproxen (NAPROSYN) 500 MG tablet   Oral   Take 500 mg by mouth 2 (two) times daily with a meal.         . ranitidine (ZANTAC) 150 MG tablet      TAKE 1 TABLET BY MOUTH TWO TIMES A DAY   180 tablet   1   . simvastatin (ZOCOR) 20 MG tablet      TAKE 1 TABLET (20 MG TOTAL) BY MOUTH AT BEDTIME.   90 tablet   1   . solifenacin (VESICARE) 10 MG tablet      One tablet once daily   90 tablet   1     Dose increase effective 07/28/2013    BP 153/71  Pulse 82  Temp(Src) 97.8 F (36.6 C) (Oral)  Resp 20  Ht 5\' 5"  (1.651 m)  Wt 205 lb (92.987 kg)  BMI 34.11 kg/m2  SpO2 100%  Physical Exam  Nursing note and vitals reviewed. Constitutional: She is oriented to person, place, and time. She appears well-developed and well-nourished. No distress.  HENT:  Head: Normocephalic and atraumatic.  Mouth/Throat: Oropharynx is clear and moist.  Eyes: Conjunctivae are normal. Pupils are equal, round, and reactive to light. No scleral icterus.  Neck:  Neck supple.  Cardiovascular: Normal rate, regular rhythm, normal heart sounds and intact distal pulses.   No murmur heard. Pulmonary/Chest: Effort normal and breath sounds normal. No stridor. No respiratory distress. She has no rales. She exhibits no tenderness.  No reproducible tenderness to chest palpation  Abdominal: Soft. Bowel sounds are normal. She exhibits no distension. There is no tenderness.  Musculoskeletal: Normal range of motion. She exhibits no edema.  Neurological: She is alert and oriented to person, place, and time.  Skin: Skin is warm and dry. No rash noted.  Psychiatric: She has a normal mood and affect. Her behavior is normal.    ED Course  Procedures (including critical care time)  DIAGNOSTIC STUDIES: Oxygen Saturation is 100% on room air, normal by my interpretation.    COORDINATION OF CARE: 11:10 PM-Discussed treatment plan which includes cardiac workup with pt at bedside and pt agreed to plan.    Labs Review Labs Reviewed  CBC - Abnormal; Notable for the following:    RBC 3.79 (*)    All other components within normal limits  COMPREHENSIVE METABOLIC PANEL - Abnormal; Notable for the following:    Sodium 134 (*)    Chloride 95 (*)    Glucose, Bld 222 (*)    BUN 28 (*)    Creatinine, Ser 1.79 (*)    Total Bilirubin 0.2 (*)    GFR calc non Af Amer 28 (*)    GFR calc Af Amer 33 (*)    All other components within normal limits  POCT I-STAT, CHEM 8 - Abnormal; Notable for the following:    BUN 29 (*)    Creatinine, Ser 2.00 (*)    Glucose, Bld 195 (*)    All other components within normal limits  POCT I-STAT TROPONIN I - Abnormal; Notable for the following:    Troponin i, poc 0.89 (*)    All other components within normal limits  PROTIME-INR  APTT    Imaging Review Dg Chest Portable 1 View  10/27/2013   CLINICAL DATA:  Chest pain for 3 days. Nausea and shortness of breath.  EXAM: PORTABLE CHEST - 1 VIEW  COMPARISON:  Chest radiograph performed  01/17/2005  FINDINGS: The lungs are well-aerated. Pulmonary vascularity is at the upper limits of normal. Mild bibasilar opacities likely reflect atelectasis. There is no evidence of pleural effusion or pneumothorax.  The cardiomediastinal silhouette is within normal limits. No acute osseous abnormalities are seen.  IMPRESSION: Mild bibasilar opacities likely reflect atelectasis; lungs otherwise clear.   Electronically Signed   By: Garald Balding M.D.   On: 10/27/2013 23:41    EKG Interpretation    Date/Time:  Friday October 27 2013 22:55:55 EST Ventricular Rate:  89 PR Interval:  216 QRS Duration: 92 QT Interval:  364 QTC Calculation: 442 R Axis:   -45 Text Interpretation:  Sinus rhythm with 1st degree A-V block Right atrial enlargement Left anterior fascicular block abnormal ST segment in lead 3 Abnormal ECG No previous ECGs available Confirmed by Seara Hinesley  MD, Marrietta Thunder [phone]) on 10/27/2013  11:10:33 PM            MDM   1. NSTEMI (non-ST elevated myocardial infarction)    Pt feels ongoing pai - does not seem to be going away with position and has some exertional sx.  Her ECG is abnormal but does not meet criteria for a STEMI though I suspect ischemic process.  11:50 PM - troponin is elevated at 0.89, renal function at baseling - HD stable but in need of nitro gtt and heparin gtt.  Will d/w Cardiology for transfer to Heart center.  12:35 - d/w Dr. Radford Pax - accepts in transfer to ICU bed - nitro and heparin gtt ordered.  CRITICAL CARE Performed by: [name] Total critical care time: 35 Critical care time was exclusive of separately billable procedures and treating other patients. Critical care was necessary to treat or prevent imminent or life-threatening deterioration. Critical care was time spent personally by me on the following activities: development of treatment plan with patient and/or surrogate as well as nursing, discussions with consultants, evaluation of patient's  response to treatment, examination of patient, obtaining history from patient or surrogate, ordering and performing treatments and interventions, ordering and review of laboratory studies, ordering and review of radiographic studies, pulse oximetry and re-evaluation of patient's condition.      I personally performed the services described in this documentation, which was scribed in my presence. The recorded information has been reviewed and is accurate.     [name], MD 10/28/13 [phone]

## 2013-10-27 NOTE — ED Notes (Signed)
Chest pain for 3 days, nausea, with sob.

## 2013-10-28 ENCOUNTER — Encounter (HOSPITAL_COMMUNITY): Payer: Self-pay | Admitting: General Practice

## 2013-10-28 DIAGNOSIS — I214 Non-ST elevation (NSTEMI) myocardial infarction: Secondary | ICD-10-CM | POA: Diagnosis present

## 2013-10-28 DIAGNOSIS — I10 Essential (primary) hypertension: Secondary | ICD-10-CM

## 2013-10-28 DIAGNOSIS — N183 Chronic kidney disease, stage 3 unspecified: Secondary | ICD-10-CM | POA: Insufficient documentation

## 2013-10-28 LAB — COMPREHENSIVE METABOLIC PANEL
ALT: 8 U/L (ref 0–35)
AST: 21 U/L (ref 0–37)
Albumin: 3.8 g/dL (ref 3.5–5.2)
Alkaline Phosphatase: 57 U/L (ref 39–117)
Alkaline Phosphatase: 53 U/L (ref 39–117)
BUN: 28 mg/dL — ABNORMAL HIGH (ref 6–23)
BUN: 26 mg/dL — ABNORMAL HIGH (ref 6–23)
CO2: 23 mEq/L (ref 19–32)
Chloride: 95 mEq/L — ABNORMAL LOW (ref 96–112)
Chloride: 101 mEq/L (ref 96–112)
Creatinine, Ser: 1.79 mg/dL — ABNORMAL HIGH (ref 0.50–1.10)
GFR calc Af Amer: 33 mL/min — ABNORMAL LOW (ref >90)
GFR calc Af Amer: 37 mL/min — ABNORMAL LOW (ref >90)
GFR calc non Af Amer: 28 mL/min — ABNORMAL LOW (ref >90)
Glucose, Bld: 222 mg/dL — ABNORMAL HIGH (ref 70–99)
Glucose, Bld: 115 mg/dL — ABNORMAL HIGH (ref 70–99)
Potassium: 4.5 mEq/L (ref 3.5–5.1)
Potassium: 4.6 mEq/L (ref 3.5–5.1)
Sodium: 136 mEq/L (ref 135–145)
Total Bilirubin: 0.2 mg/dL — ABNORMAL LOW (ref 0.3–1.2)
Total Bilirubin: 0.2 mg/dL — ABNORMAL LOW (ref 0.3–1.2)
Total Protein: 6.4 g/dL (ref 6.0–8.3)

## 2013-10-28 LAB — HEPARIN LEVEL (UNFRACTIONATED)
Heparin Unfractionated: 0.25 IU/mL — ABNORMAL LOW (ref 0.30–0.70)
Heparin Unfractionated: 0.24 IU/mL — ABNORMAL LOW (ref 0.30–0.70)

## 2013-10-28 LAB — PROTIME-INR
INR: 1.04 (ref 0.00–1.49)
INR: 1.11 (ref 0.00–1.49)
Prothrombin Time: 13.4 seconds (ref 11.6–15.2)

## 2013-10-28 LAB — CBC
HCT: 32.2 % — ABNORMAL LOW (ref 36.0–46.0)
MCH: 32.4 pg (ref 26.0–34.0)
MCHC: 34.2 g/dL (ref 30.0–36.0)
MCV: 94.7 fL (ref 78.0–100.0)
RDW: 13.1 % (ref 11.5–15.5)
WBC: 7.6 10*3/uL (ref 4.0–10.5)

## 2013-10-28 LAB — TSH: TSH: 2.685 u[IU]/mL (ref 0.350–4.500)

## 2013-10-28 LAB — GLUCOSE, CAPILLARY: Glucose-Capillary: 175 mg/dL — ABNORMAL HIGH (ref 70–99)

## 2013-10-28 LAB — TROPONIN I
Troponin I: 1.82 ng/mL (ref <0.30)
Troponin I: 2.59 ng/mL (ref <0.30)

## 2013-10-28 LAB — HEMOGLOBIN A1C: Hgb A1c MFr Bld: 6.2 % — ABNORMAL HIGH (ref <5.7)

## 2013-10-28 MED ORDER — DARIFENACIN HYDROBROMIDE ER 15 MG PO TB24
15.0000 mg | ORAL_TABLET | Freq: Every day | ORAL | Status: DC
  Administered 2013-10-28 – 2013-11-01 (×5): 15 mg via ORAL
  Filled 2013-10-28 (×6): qty 1

## 2013-10-28 MED ORDER — PNEUMOCOCCAL VAC POLYVALENT 25 MCG/0.5ML IJ INJ
0.5000 mL | INJECTION | INTRAMUSCULAR | Status: DC
  Filled 2013-10-28: qty 0.5

## 2013-10-28 MED ORDER — FAMOTIDINE 20 MG PO TABS
20.0000 mg | ORAL_TABLET | Freq: Two times a day (BID) | ORAL | Status: DC
  Administered 2013-10-28 – 2013-11-01 (×10): 20 mg via ORAL
  Filled 2013-10-28 (×13): qty 1

## 2013-10-28 MED ORDER — ASPIRIN EC 81 MG PO TBEC
81.0000 mg | DELAYED_RELEASE_TABLET | Freq: Every day | ORAL | Status: DC
  Administered 2013-10-28 – 2013-11-01 (×4): 81 mg via ORAL
  Filled 2013-10-28 (×6): qty 1

## 2013-10-28 MED ORDER — HEPARIN (PORCINE) IN NACL 100-0.45 UNIT/ML-% IJ SOLN: 1550.0000 [IU]/h | INTRAMUSCULAR | Status: DC

## 2013-10-28 MED ORDER — HEPARIN (PORCINE) IN NACL 100-0.45 UNIT/ML-% IJ SOLN: 1100.0000 [IU]/h | INTRAMUSCULAR | Status: DC

## 2013-10-28 MED ORDER — HEPARIN (PORCINE) IN NACL 100-0.45 UNIT/ML-% IJ SOLN
1300.0000 [IU]/h | INTRAMUSCULAR | Status: DC
  Administered 2013-10-28: 1300 [IU]/h via INTRAVENOUS
  Filled 2013-10-28 (×2): qty 250

## 2013-10-28 MED ORDER — DULOXETINE HCL 60 MG PO CPEP
60.0000 mg | ORAL_CAPSULE | Freq: Two times a day (BID) | ORAL | Status: DC
  Administered 2013-10-28 – 2013-11-01 (×10): 60 mg via ORAL
  Filled 2013-10-28 (×12): qty 1

## 2013-10-28 MED ORDER — LORATADINE 10 MG PO TABS
10.0000 mg | ORAL_TABLET | Freq: Every day | ORAL | Status: DC
  Administered 2013-10-28 – 2013-11-01 (×5): 10 mg via ORAL
  Filled 2013-10-28 (×6): qty 1

## 2013-10-28 MED ORDER — NICOTINE 14 MG/24HR TD PT24
14.0000 mg | MEDICATED_PATCH | Freq: Every day | TRANSDERMAL | Status: DC
  Filled 2013-10-28: qty 1

## 2013-10-28 MED ORDER — NICOTINE 14 MG/24HR TD PT24
14.0000 mg | MEDICATED_PATCH | Freq: Every day | TRANSDERMAL | Status: DC
  Administered 2013-10-28 – 2013-11-01 (×5): 14 mg via TRANSDERMAL
  Filled 2013-10-28 (×6): qty 1

## 2013-10-28 MED ORDER — BENAZEPRIL HCL 20 MG PO TABS
20.0000 mg | ORAL_TABLET | Freq: Every day | ORAL | Status: DC
  Administered 2013-10-28 – 2013-11-01 (×5): 20 mg via ORAL
  Filled 2013-10-28 (×6): qty 1

## 2013-10-28 MED ORDER — AMITRIPTYLINE HCL 25 MG PO TABS
25.0000 mg | ORAL_TABLET | Freq: Every day | ORAL | Status: DC
  Administered 2013-10-28 – 2013-11-01 (×5): 25 mg via ORAL
  Filled 2013-10-28 (×6): qty 1

## 2013-10-28 MED ORDER — SODIUM CHLORIDE 0.9 % IV SOLN: INTRAVENOUS | Status: DC

## 2013-10-28 MED ORDER — NITROGLYCERIN IN D5W 200-5 MCG/ML-% IV SOLN: 5.0000 ug/min | Freq: Once | INTRAVENOUS | Status: DC

## 2013-10-28 MED ORDER — ASPIRIN EC 81 MG PO TBEC: 81.0000 mg | DELAYED_RELEASE_TABLET | Freq: Every day | ORAL | Status: DC

## 2013-10-28 MED ORDER — AMLODIPINE BESYLATE 5 MG PO TABS
5.0000 mg | ORAL_TABLET | Freq: Every day | ORAL | Status: DC
  Administered 2013-10-28 – 2013-11-01 (×5): 5 mg via ORAL
  Filled 2013-10-28 (×6): qty 1

## 2013-10-28 MED ORDER — HEPARIN (PORCINE) IN NACL 100-0.45 UNIT/ML-% IJ SOLN
1100.0000 [IU]/h | INTRAMUSCULAR | Status: DC
  Filled 2013-10-28: qty 250

## 2013-10-28 MED ORDER — SIMVASTATIN 20 MG PO TABS
20.0000 mg | ORAL_TABLET | Freq: Every day | ORAL | Status: DC
  Administered 2013-10-28 – 2013-11-01 (×5): 20 mg via ORAL
  Filled 2013-10-28 (×6): qty 1

## 2013-10-28 MED ORDER — ONDANSETRON HCL 4 MG/2ML IJ SOLN: 4.0000 mg | Freq: Four times a day (QID) | INTRAMUSCULAR | Status: DC | PRN

## 2013-10-28 MED ORDER — METOPROLOL TARTRATE 12.5 MG HALF TABLET
12.5000 mg | ORAL_TABLET | Freq: Two times a day (BID) | ORAL | Status: DC
  Administered 2013-10-28 – 2013-11-01 (×11): 12.5 mg via ORAL
  Filled 2013-10-28 (×13): qty 1

## 2013-10-28 MED ORDER — LEVOTHYROXINE SODIUM 50 MCG PO TABS
50.0000 ug | ORAL_TABLET | Freq: Every day | ORAL | Status: DC
  Administered 2013-10-28 – 2013-11-01 (×5): 50 ug via ORAL
  Filled 2013-10-28 (×7): qty 1

## 2013-10-28 MED ORDER — ONDANSETRON HCL 4 MG/2ML IJ SOLN: 4.0000 mg | Freq: Three times a day (TID) | INTRAMUSCULAR | Status: DC | PRN

## 2013-10-28 MED ORDER — CLONAZEPAM 1 MG PO TABS
1.0000 mg | ORAL_TABLET | Freq: Three times a day (TID) | ORAL | Status: DC | PRN
  Administered 2013-10-30 – 2013-11-01 (×3): 1 mg via ORAL
  Filled 2013-10-28 (×3): qty 1

## 2013-10-28 MED ORDER — NITROGLYCERIN 0.4 MG SL SUBL: 0.4000 mg | SUBLINGUAL_TABLET | SUBLINGUAL | Status: DC | PRN

## 2013-10-28 MED ORDER — BUPROPION HCL ER (XL) 300 MG PO TB24
300.0000 mg | ORAL_TABLET | Freq: Every day | ORAL | Status: DC
  Administered 2013-10-28 – 2013-11-01 (×5): 300 mg via ORAL
  Filled 2013-10-28 (×6): qty 1

## 2013-10-28 MED ORDER — NITROGLYCERIN IN D5W 200-5 MCG/ML-% IV SOLN: 3.0000 ug/min | INTRAVENOUS | Status: DC

## 2013-10-28 MED ORDER — ACETAMINOPHEN 325 MG PO TABS
650.0000 mg | ORAL_TABLET | ORAL | Status: DC | PRN
  Administered 2013-10-28 – 2013-11-01 (×9): 650 mg via ORAL
  Filled 2013-10-28 (×9): qty 2

## 2013-10-28 NOTE — Progress Notes (Signed)
Troponin is elevated Remains chest free  Will continue medical therapy and proceed with cath on Monday if she remains stable.

## 2013-10-28 NOTE — Progress Notes (Addendum)
ANTICOAGULATION CONSULT NOTE - Follow Up Consult  Pharmacy Consult:  Heparin Indication: chest pain/ACS  Allergies  Allergen Reactions  . Meperidine Hcl   . Niacin     REACTION: face peeling and burning  . Propoxyphene-Acetaminophen     Patient Measurements: Height: 5\' 5"  (165.1 cm) Weight: 211 lb 6.7 oz (95.9 kg) IBW/kg (Calculated) : 57 Heparin Dosing Weight: 85 kg  Vital Signs: Temp: 97.7 F (36.5 C) (12/13 0300) Temp src: Axillary (12/13 0300) BP: 117/51 mmHg (12/13 0600) Pulse Rate: 59 (12/13 0600)  Labs:  Recent Labs  10/27/13 2319 10/27/13 2325 10/28/13 0530 10/28/13 0540 10/28/13 0729  HGB 12.0 12.6  --   --  11.0*  HCT 36.0 37.0  --   --  32.2*  PLT 329  --   --   --  264  APTT 26  --   --  52*  --   LABPROT 13.4  --   --  14.1  --   INR 1.04  --   --  1.11  --   HEPARINUNFRC  --   --   --   --  0.25*  CREATININE 1.79* 2.00*  --  1.63*  --   TROPONINI  --   --  1.82*  --   --     Estimated Creatinine Clearance: 38.4 ml/min (by C-G formula based on Cr of 1.63).      Assessment: 48 YOF with chest pain started on IV heparin and transferred from [facility] for further management.  Heparin level is sub-therapeutic.  No complications with infusion per RN.  No bleeding reported.   Goal of Therapy:  Heparin level 0.3-0.7 units/ml Monitor platelets by anticoagulation protocol: Yes    Plan:  - Increase heparin gtt to 1300 units/hr - Check 6 hr HL - Daily HL / CBC    Diogenes Whirley D. Mina Marble, PharmD, BCPS Pager:  [phone] 10/28/2013, 10:07 AM

## 2013-10-28 NOTE — Progress Notes (Signed)
CRITICAL VALUE ALERT  Critical value received:  Trop 1.82   Date of notification:  10/28/2013  Time of notification:  0928  Critical value read back:yes  Nurse who received alert:  Terri Piedra  MD notified (1st page):  Jory Sims, NP  Time of first page:  1006  MD notified (2nd page):  Time of second page:  Responding MD:  Jory Sims, NP   Time MD responded:  1006

## 2013-10-28 NOTE — H&P (Addendum)
Admit date: 10/27/2013 Referring Physician Dr. Noemi Chapel Primary Cardiologist None Chief complaint/reason for admission: Chest pain  HPI: This is a Tammy Suarez with a history of HTN, type II diet controlled DM, dyslipidemia and obesity who presented to the ER with complaints of chest pain.  She states that she has been having CP for 3 days that has been intermittent left sided with radiation into her left jaw and left arm associated with exertional dyspnea.  She has noticed the pain mainly at night.  She describes the pain as a burning sensation.  It initially started in bed 3 nights ago and since then the pain has been off and on.  She has also noticed numbness of her left arm off and on for about 3 weeks.  She says that it goes away during the day and comes on worse at night.  She denies any nausea or diaphoresis.  Per the family the patient has been intermittently walking sideways and stumbling.  She was noted to have an elevated troponin c/w NSTEMI and is now transferred to [facility].  Currently she denies any chest discomfort.    PMH:    Past Medical History  Diagnosis Date  . Vitamin B 12 deficiency   . Chronic back pain   . GERD (gastroesophageal reflux disease)   . Nicotine addiction   . Hyperlipidemia   . Obesity   . Hypertension   . Type 2 diabetes mellitus without complications   . Conjunctivitis, acute   . CKD (chronic kidney disease)     PSH:    Past Surgical History  Procedure Laterality Date  . Cervical laminectomy    . Cholecystectomy    . Inguinal herniorrhaphy    . Vesicovaginal fistula closure w/ tah    . Lumbar fusion    . Left index finger repair    . Laparoscopic gastric banding  2009  . Cosmetic surgery  07/01/2012    recurrent skin infection on lower abdomen, [facility]  . Abdominal hysterectomy    . Spine surgery      ALLERGIES:   Meperidine hcl; Niacin; and Propoxyphene-acetaminophen  Prior to Admit Meds:   Prescriptions prior to admission  Medication Sig  Dispense Refill  . amitriptyline (ELAVIL) 25 MG tablet TAKE 1 TABLET BY MOUTH AT BEDTIME  30 tablet  1  . amLODipine (NORVASC) 5 MG tablet Take 5 mg by mouth daily.      Marland Kitchen aspirin (BAYER ASPIRIN EC LOW DOSE) 81 MG EC tablet Take 81 mg by mouth daily.        . benazepril (LOTENSIN) 20 MG tablet Take 1 tablet (20 mg total) by mouth daily.  30 tablet  5  . buPROPion (WELLBUTRIN XL) 300 MG 24 hr tablet Take 1 tablet (300 mg total) by mouth every morning.  90 tablet  1  . Choline Fenofibrate (FENOFIBRIC ACID) 135 MG CPDR TAKE 1 CAPSULE EVERY DAY  30 capsule  1  . clonazePAM (KLONOPIN) 1 MG tablet TAKE 1 TABLET BY MOUTH 3 TIMES A DAY  90 tablet  3  . DULoxetine (CYMBALTA) 60 MG capsule TAKE ONE CAPSULE BY MOUTH TWICE A DAY  60 capsule  3  . fish oil-omega-3 fatty acids 1000 MG capsule Take 2 g by mouth daily.        Marland Kitchen HYDROcodone-acetaminophen (NORCO) 10-325 MG per tablet Take 1 tablet by mouth 4 (four) times daily.  120 tablet  0  . levothyroxine (SYNTHROID, LEVOTHROID) 50 MCG tablet Take 1 tablet (  50 mcg total) by mouth daily.  30 tablet  3  . loratadine (CLARITIN) 10 MG tablet Take 1 tablet (10 mg total) by mouth daily.  30 tablet  3  . naproxen (NAPROSYN) 500 MG tablet Take 500 mg by mouth 2 (two) times daily with a meal.      . ranitidine (ZANTAC) 150 MG tablet TAKE 1 TABLET BY MOUTH TWO TIMES A DAY  180 tablet  1  . simvastatin (ZOCOR) 20 MG tablet TAKE 1 TABLET (20 MG TOTAL) BY MOUTH AT BEDTIME.  90 tablet  1  . solifenacin (VESICARE) 10 MG tablet One tablet once daily  90 tablet  1   Family HX:    Family History  Problem Relation Age of Onset  . Heart attack Mother   . Heart attack Father   . Hypertension Sister   . Heart attack Sister   . Cancer Brother     renal cell    Social HX:    History   Social History  . Marital Status: Single    Spouse Name: N/A    Number of Children: 3  . Years of Education: N/A   Occupational History  . Not on file.   Social History Main Topics    . Smoking status: Current Some Day Smoker -- 1.00 packs/day    Types: Cigarettes    Last Attempt to Quit: 05/08/2012  . Smokeless tobacco: Never Used     Comment: quit for 3 weeks   . Alcohol Use: No  . Drug Use: No  . Sexual Activity: Not on file   Other Topics Concern  . Not on file   Social History Narrative  . No narrative on file     ROS:  All 11 ROS were addressed and are negative except what is stated in the HPI  PHYSICAL EXAM Filed Vitals:   10/28/13 0145  BP: 112/66  Pulse: 79  Temp:   Resp:    General: Well developed, well nourished, in no acute distress Head: Eyes PERRLA, No xanthomas.   Normal cephalic and atramatic  Lungs:   Clear bilaterally to auscultation and percussion. Heart:   HRRR S1 S2 Pulses are 2+ & equal.            No carotid bruit. No JVD.  No abdominal bruits. No femoral bruits. Abdomen: Bowel sounds are positive, abdomen soft and non-tender without masses Extremities:   No clubbing, cyanosis or edema.  DP +1 Neuro: Alert and oriented X 3. Psych:  Good affect, responds appropriately Right breast with quarter sized excoriation of the skin at about 10-11o'clock   Labs:   Lab Results  Component Value Date   WBC 7.9 10/27/2013   HGB 12.6 10/27/2013   HCT 37.0 10/27/2013   MCV 95.0 10/27/2013   PLT 329 10/27/2013     Recent Labs Lab 10/27/13 2319 10/27/13 2325  NA 134* 138  K 4.5 4.5  CL 95* 100  CO2 24  --   BUN 28* 29*  CREATININE 1.79* 2.00*  CALCIUM 9.9  --   PROT 7.4  --   BILITOT 0.2*  --   ALKPHOS 57  --   ALT 11  --   AST 22  --   GLUCOSE 222* 195*   No results found for this basename: CKTOTAL,  CKMB,  CKMBINDEX,  TROPONINI   No results found for this basename: PTT   Lab Results  Component Value Date   INR 1.04 10/27/2013  Lab Results  Component Value Date   CHOL 162 08/29/2013   CHOL 166 05/10/2013   CHOL 161 11/24/2012   Lab Results  Component Value Date   HDL 33* 08/29/2013   HDL 28* 05/10/2013    HDL 27* 11/24/2012   Lab Results  Component Value Date   LDLCALC 75 08/29/2013   LDLCALC 100* 05/10/2013   LDLCALC 85 11/24/2012   Lab Results  Component Value Date   TRIG 268* 08/29/2013   TRIG 188* 05/10/2013   TRIG 247* 11/24/2012   Lab Results  Component Value Date   CHOLHDL 4.9 08/29/2013   CHOLHDL 5.9 05/10/2013   CHOLHDL 6.0 11/24/2012   No results found for this basename: LDLDIRECT      Radiology:  CLINICAL DATA: Chest pain for 3 days. Nausea and shortness of  breath.  EXAM:  PORTABLE CHEST - 1 VIEW  COMPARISON: Chest radiograph performed 01/17/2005  FINDINGS:  The lungs are well-aerated. Pulmonary vascularity is at the upper  limits of normal. Mild bibasilar opacities likely reflect  atelectasis. There is no evidence of pleural effusion or  pneumothorax.  The cardiomediastinal silhouette is within normal limits. No acute  osseous abnormalities are seen.  IMPRESSION:  Mild bibasilar opacities likely reflect atelectasis; lungs otherwise  clear.    EKG:  NSR with nonspecific ST abnormality in I and aVL  ASSESSMENT:  1.  NSTEMI - with no CP at present and nonspecific ST abnormality in I and aVL on EKG 2.  Type II diet controlled DM 3.  HTN - controlled 4.  Dyslipidemia 5.  Breast skin lesion at 10-11:00 o'clock on right breast that is excoriated - patient states she reached down to scratch it and a bunch of fluid came out of it.  ? Etiology 6.  CKD  - she says that she has seen a kidney MD a while ago  PLAN:    1.  Admit to CCU 2.  Continue to cycle cardiac enzymes until they peak 3.  IV Heparin gtt 4.  IV NTG - titrate to keep pain free 5.  ASA 81mg  daily 6.  Lopressor 12.5mg  BID 7.  Plan cath on Monday  Sueanne Margarita, MD  10/28/2013  3:13 AM

## 2013-10-28 NOTE — Progress Notes (Signed)
ANTICOAGULATION CONSULT NOTE - Initial Consult  Pharmacy Consult for Heparin Indication: chest pain/ACS  Allergies  Allergen Reactions  . Meperidine Hcl   . Niacin     REACTION: face peeling and burning  . Propoxyphene-Acetaminophen     Patient Measurements: Height: 5\' 5"  (165.1 cm) Weight: 211 lb 6.7 oz (95.9 kg) IBW/kg (Calculated) : 57 Heparin Dosing Weight: 85 kg  Vital Signs: Temp: 97.8 F (36.6 C) (12/12 2252) Temp src: Oral (12/12 2252) BP: 141/62 mmHg (12/13 0300) Pulse Rate: 74 (12/13 0300)  Labs:  Recent Labs  10/27/13 2319 10/27/13 2325  HGB 12.0 12.6  HCT 36.0 37.0  PLT 329  --   APTT 26  --   LABPROT 13.4  --   INR 1.04  --   CREATININE 1.79* 2.00*    Estimated Creatinine Clearance: 31.3 ml/min (by C-G formula based on Cr of 2).   Medical History: Past Medical History  Diagnosis Date  . Vitamin B 12 deficiency   . Chronic back pain   . GERD (gastroesophageal reflux disease)   . Nicotine addiction   . Hyperlipidemia   . Obesity   . Hypertension   . Type 2 diabetes mellitus without complications   . Conjunctivitis, acute   . CKD (chronic kidney disease)     Medications:  Prescriptions prior to admission  Medication Sig Dispense Refill  . amitriptyline (ELAVIL) 25 MG tablet TAKE 1 TABLET BY MOUTH AT BEDTIME  30 tablet  1  . amLODipine (NORVASC) 5 MG tablet Take 5 mg by mouth daily.      Marland Kitchen aspirin (BAYER ASPIRIN EC LOW DOSE) 81 MG EC tablet Take 81 mg by mouth daily.        . benazepril (LOTENSIN) 20 MG tablet Take 1 tablet (20 mg total) by mouth daily.  30 tablet  5  . buPROPion (WELLBUTRIN XL) 300 MG 24 hr tablet Take 1 tablet (300 mg total) by mouth every morning.  90 tablet  1  . Choline Fenofibrate (FENOFIBRIC ACID) 135 MG CPDR TAKE 1 CAPSULE EVERY DAY  30 capsule  1  . clonazePAM (KLONOPIN) 1 MG tablet TAKE 1 TABLET BY MOUTH 3 TIMES A DAY  90 tablet  3  . DULoxetine (CYMBALTA) 60 MG capsule TAKE ONE CAPSULE BY MOUTH TWICE A DAY  60  capsule  3  . fish oil-omega-3 fatty acids 1000 MG capsule Take 2 g by mouth daily.        Marland Kitchen HYDROcodone-acetaminophen (NORCO) 10-325 MG per tablet Take 1 tablet by mouth 4 (four) times daily.  120 tablet  0  . levothyroxine (SYNTHROID, LEVOTHROID) 50 MCG tablet Take 1 tablet (50 mcg total) by mouth daily.  30 tablet  3  . loratadine (CLARITIN) 10 MG tablet Take 1 tablet (10 mg total) by mouth daily.  30 tablet  3  . naproxen (NAPROSYN) 500 MG tablet Take 500 mg by mouth 2 (two) times daily with a meal.      . ranitidine (ZANTAC) 150 MG tablet TAKE 1 TABLET BY MOUTH TWO TIMES A DAY  180 tablet  1  . simvastatin (ZOCOR) 20 MG tablet TAKE 1 TABLET (20 MG TOTAL) BY MOUTH AT BEDTIME.  90 tablet  1  . solifenacin (VESICARE) 10 MG tablet One tablet once daily  90 tablet  1    Assessment: 67 y.o. female with NSTEMI for heparin.. Heparin 4000 units IV bolus, 1100 units/hr started at [facility] at midnight  Goal of Therapy:  Heparin level  0.3-0.7 units/ml Monitor platelets by anticoagulation protocol: Yes   Plan:  Continue Heparin at current rate Follow-up am labs.  Nariya Neumeyer, Bronson Curb 10/28/2013,3:38 AM

## 2013-10-28 NOTE — Progress Notes (Signed)
ANTICOAGULATION CONSULT NOTE - Follow Up Consult  Pharmacy Consult for Heparin Indication: chest pain/ACS  Allergies  Allergen Reactions  . Meperidine Hcl Nausea And Vomiting  . Niacin Other (See Comments)    REACTION: face peeling and burning  . Propoxyphene-Acetaminophen Nausea And Vomiting    Patient Measurements: Height: 5\' 5"  (165.1 cm) Weight: 211 lb 6.7 oz (95.9 kg) IBW/kg (Calculated) : 57 Heparin Dosing Weight: 85kg  Vital Signs: Temp: 98.2 F (36.8 C) (12/13 1652) Temp src: Oral (12/13 1652) BP: 110/45 mmHg (12/13 1652) Pulse Rate: 62 (12/13 1652)  Labs:  Recent Labs  10/27/13 2319 10/27/13 2325 10/28/13 0530 10/28/13 0540 10/28/13 0729 10/28/13 0905 10/28/13 1640  HGB 12.0 12.6  --   --  11.0*  --   --   HCT 36.0 37.0  --   --  32.2*  --   --   PLT 329  --   --   --  264  --   --   APTT 26  --   --  52*  --   --   --   LABPROT 13.4  --   --  14.1  --   --   --   INR 1.04  --   --  1.11  --   --   --   HEPARINUNFRC  --   --   --   --  0.25*  --  0.24*  CREATININE 1.79* 2.00*  --  1.63*  --   --   --   TROPONINI  --   --  1.82*  --   --  2.59*  --     Estimated Creatinine Clearance: 38.4 ml/min (by C-G formula based on Cr of 1.63).   Medications:  Heparin @ 1300 units/hr  Assessment: 67yof continues on IV heparin for chest pain with plans for cath on Monday. Heparin level remains below goal despite rate increase this morning. No issues with infusion. Will increase rate.  Goal of Therapy:  Heparin level 0.3-0.7 units/ml Monitor platelets by anticoagulation protocol: Yes   Plan:  1) Increase heparin to 1650 units/hr 2) Check another heparin level in 6 hours  Deboraha Sprang 10/28/2013,5:35 PM

## 2013-10-29 DIAGNOSIS — I10 Essential (primary) hypertension: Secondary | ICD-10-CM

## 2013-10-29 DIAGNOSIS — N189 Chronic kidney disease, unspecified: Secondary | ICD-10-CM

## 2013-10-29 DIAGNOSIS — F17200 Nicotine dependence, unspecified, uncomplicated: Secondary | ICD-10-CM

## 2013-10-29 LAB — HEPARIN LEVEL (UNFRACTIONATED)
Heparin Unfractionated: 0.8 IU/mL — ABNORMAL HIGH (ref 0.30–0.70)
Heparin Unfractionated: 0.9 IU/mL — ABNORMAL HIGH (ref 0.30–0.70)
Heparin Unfractionated: 0.94 IU/mL — ABNORMAL HIGH (ref 0.30–0.70)

## 2013-10-29 LAB — GLUCOSE, CAPILLARY
Glucose-Capillary: 102 mg/dL — ABNORMAL HIGH (ref 70–99)
Glucose-Capillary: 130 mg/dL — ABNORMAL HIGH (ref 70–99)

## 2013-10-29 LAB — CBC
HCT: 32.1 % — ABNORMAL LOW (ref 36.0–46.0)
Platelets: 241 10*3/uL (ref 150–400)
RDW: 12.9 % (ref 11.5–15.5)
WBC: 5.4 10*3/uL (ref 4.0–10.5)

## 2013-10-29 MED ORDER — PNEUMOCOCCAL VAC POLYVALENT 25 MCG/0.5ML IJ INJ: 0.5000 mL | INJECTION | INTRAMUSCULAR | Status: DC | PRN

## 2013-10-29 MED ORDER — SODIUM CHLORIDE 0.9 % IV SOLN: 250.0000 mL | INTRAVENOUS | Status: DC | PRN

## 2013-10-29 MED ORDER — ASPIRIN 81 MG PO CHEW
81.0000 mg | CHEWABLE_TABLET | ORAL | Status: AC
  Administered 2013-10-30: 81 mg via ORAL
  Filled 2013-10-29: qty 1

## 2013-10-29 MED ORDER — SODIUM CHLORIDE 0.9 % IV SOLN
1.0000 mL/kg/h | INTRAVENOUS | Status: DC
  Administered 2013-10-30: 1 mL/kg/h via INTRAVENOUS

## 2013-10-29 MED ORDER — HEPARIN (PORCINE) IN NACL 100-0.45 UNIT/ML-% IJ SOLN
1250.0000 [IU]/h | INTRAMUSCULAR | Status: DC
  Administered 2013-10-30: 1250 [IU]/h via INTRAVENOUS
  Filled 2013-10-29 (×2): qty 250

## 2013-10-29 MED ORDER — SODIUM CHLORIDE 0.9 % IJ SOLN
3.0000 mL | Freq: Two times a day (BID) | INTRAMUSCULAR | Status: DC
  Administered 2013-10-29 – 2013-11-01 (×7): 3 mL via INTRAVENOUS

## 2013-10-29 MED ORDER — SODIUM CHLORIDE 0.9 % IJ SOLN: 3.0000 mL | INTRAMUSCULAR | Status: DC | PRN

## 2013-10-29 NOTE — Progress Notes (Signed)
ANTICOAGULATION CONSULT NOTE - Follow Up Consult  Pharmacy Consult:  Heparin Indication: chest pain/ACS  Allergies  Allergen Reactions  . Meperidine Hcl Nausea And Vomiting  . Niacin Other (See Comments)    REACTION: face peeling and burning  . Propoxyphene-Acetaminophen Nausea And Vomiting    Patient Measurements: Height: 5\' 5"  (165.1 cm) Weight: 211 lb 6.7 oz (95.9 kg) IBW/kg (Calculated) : 57 Heparin Dosing Weight: 85 kg  Vital Signs: Temp: 98.4 F (36.9 C) (12/14 0800) Temp src: Oral (12/14 0800) BP: 135/64 mmHg (12/14 0800) Pulse Rate: 59 (12/14 0600)  Labs:  Recent Labs  10/27/13 2319 10/27/13 2325 10/28/13 0530 10/28/13 0540  10/28/13 0729 10/28/13 0905 10/28/13 1640 10/28/13 2335 10/29/13 0610  HGB 12.0 12.6  --   --   --  11.0*  --   --   --  11.1*  HCT 36.0 37.0  --   --   --  32.2*  --   --   --  32.1*  PLT 329  --   --   --   --  264  --   --   --  241  APTT 26  --   --  52*  --   --   --   --   --   --   LABPROT 13.4  --   --  14.1  --   --   --   --   --   --   INR 1.04  --   --  1.11  --   --   --   --   --   --   HEPARINUNFRC  --   --   --   --   < > 0.25*  --  0.24* 0.80* 0.94*  CREATININE 1.79* 2.00*  --  1.63*  --   --   --   --   --   --   TROPONINI  --   --  1.82*  --   --   --  2.59* 1.38*  --   --   < > = values in this interval not displayed.  Estimated Creatinine Clearance: 38.4 ml/min (by C-G formula based on Cr of 1.63).      Assessment: 2 YOF with chest pain on IV heparin.  Heparin level on 10/28/13 evening was supra-therapeutic (0.8 units/mL) and heparin rate was adjusted.  Actual heparin rate on pump was not adjusted and heparin level increased to 0.94 units/mL this AM.  No bleeding reported.   Goal of Therapy:  Heparin level 0.3-0.7 units/ml Monitor platelets by anticoagulation protocol: Yes    Plan:  - Decrease heparin gtt to 1450 units/hr - Check 6 hr HL - Daily HL / CBC    Guerline Happ D. Mina Marble, PharmD, BCPS Pager:   [phone] 10/29/2013, 8:30 AM

## 2013-10-29 NOTE — Progress Notes (Signed)
ANTICOAGULATION CONSULT NOTE - Follow Up Consult  Pharmacy Consult for Heparin  Indication: chest pain/ACS  Allergies  Allergen Reactions  . Meperidine Hcl Nausea And Vomiting  . Niacin Other (See Comments)    REACTION: face peeling and burning  . Propoxyphene-Acetaminophen Nausea And Vomiting   Patient Measurements: Height: 5\' 5"  (165.1 cm) Weight: 211 lb 6.7 oz (95.9 kg) IBW/kg (Calculated) : 57 Heparin Dosing Weight: 85 kg  Vital Signs: Temp: 98 F (36.7 C) (12/13 2007) Temp src: Oral (12/13 2007) BP: 111/44 mmHg (12/13 2007) Pulse Rate: 80 (12/13 2007)  Labs:  Recent Labs  10/27/13 2319 10/27/13 2325 10/28/13 0530 10/28/13 0540 10/28/13 0729 10/28/13 0905 10/28/13 1640 10/28/13 2335  HGB 12.0 12.6  --   --  11.0*  --   --   --   HCT 36.0 37.0  --   --  32.2*  --   --   --   PLT 329  --   --   --  264  --   --   --   APTT 26  --   --  52*  --   --   --   --   LABPROT 13.4  --   --  14.1  --   --   --   --   INR 1.04  --   --  1.11  --   --   --   --   HEPARINUNFRC  --   --   --   --  0.25*  --  0.24* 0.80*  CREATININE 1.79* 2.00*  --  1.63*  --   --   --   --   TROPONINI  --   --  1.82*  --   --  2.59* 1.38*  --     Estimated Creatinine Clearance: 38.4 ml/min (by C-G formula based on Cr of 1.63).  Medications:  Heparin 1650 units/hr  Assessment: 67 y/o F on heparin for CP, plans for cath on Monday, HL 0.8 after rate increase, other labs as above.   Goal of Therapy:  Heparin level 0.3-0.7 units/ml Monitor platelets by anticoagulation protocol: Yes   Plan:  -Decrease heparin drip to 1550 units/hr -0930 HL -Daily CBC/HL -Monitor for bleeding  Thank you for allowing me to take part in this patient's care,  Narda Bonds, PharmD Clinical Pharmacist Phone: [phone] Pager: [phone] 10/29/2013 12:46 AM

## 2013-10-29 NOTE — Progress Notes (Signed)
ANTICOAGULATION CONSULT NOTE - Follow Up Consult  Pharmacy Consult:  Heparin Indication: chest pain/ACS  Allergies  Allergen Reactions  . Meperidine Hcl Nausea And Vomiting  . Niacin Other (See Comments)    REACTION: face peeling and burning  . Propoxyphene-Acetaminophen Nausea And Vomiting    Patient Measurements: Height: 5\' 5"  (165.1 cm) Weight: 211 lb 6.7 oz (95.9 kg) IBW/kg (Calculated) : 57 Heparin Dosing Weight: 85 kg  Vital Signs: Temp: 97 F (36.1 C) (12/14 1158) Temp src: Oral (12/14 1158) BP: 126/52 mmHg (12/14 1158) Pulse Rate: 79 (12/14 1158)  Labs:  Recent Labs  10/27/13 2319 10/27/13 2325 10/28/13 0530 10/28/13 0540  10/28/13 0729 10/28/13 0905 10/28/13 1640 10/28/13 2335 10/29/13 0610 10/29/13 1530  HGB 12.0 12.6  --   --   --  11.0*  --   --   --  11.1*  --   HCT 36.0 37.0  --   --   --  32.2*  --   --   --  32.1*  --   PLT 329  --   --   --   --  264  --   --   --  241  --   APTT 26  --   --  52*  --   --   --   --   --   --   --   LABPROT 13.4  --   --  14.1  --   --   --   --   --   --   --   INR 1.04  --   --  1.11  --   --   --   --   --   --   --   HEPARINUNFRC  --   --   --   --   < > 0.25*  --  0.24* 0.80* 0.94* 0.90*  CREATININE 1.79* 2.00*  --  1.63*  --   --   --   --   --   --   --   TROPONINI  --   --  1.82*  --   --   --  2.59* 1.38*  --   --   --   < > = values in this interval not displayed.  Estimated Creatinine Clearance: 38.4 ml/min (by C-G formula based on Cr of 1.63).  Assessment: 50 YOF with chest pain on IV heparin.  Heparin level on 10/28/13 evening was supra-therapeutic (0.8 units/mL) and heparin rate was adjusted.  Actual heparin rate on pump was not adjusted and heparin level increased to 0.94 units/mL this AM. Rate was decreased this morning to 1450 units/hr, and a recheck was ordered. That recheck this afternoon was still elevated at 0.9units/mL. Per RN Melissa no bleeding noted.  Goal of Therapy:  Heparin level  0.3-0.7 units/ml Monitor platelets by anticoagulation protocol: Yes   Plan:  - Decrease heparin gtt to 1250 units/hr - Check 6 hr HL - Daily HL / CBC  Issak Goley D. Quantasia Stegner, PharmD, BCPS Clinical Pharmacist Pager: [phone] 10/29/2013 4:08 PM

## 2013-10-29 NOTE — Progress Notes (Addendum)
SUBJECTIVE: The patient is doing well today.  At this time, she denies chest pain, shortness of breath, or any new concerns.  Marland Kitchen amitriptyline  25 mg Oral QHS  . amLODipine  5 mg Oral Daily  . aspirin EC  81 mg Oral Daily  . benazepril  20 mg Oral Daily  . buPROPion  300 mg Oral Daily  . darifenacin  15 mg Oral Daily  . DULoxetine  60 mg Oral BID  . famotidine  20 mg Oral BID  . levothyroxine  50 mcg Oral QAC breakfast  . loratadine  10 mg Oral Daily  . metoprolol tartrate  12.5 mg Oral BID  . nicotine  14 mg Transdermal Q0600  . nitroGLYCERIN  5-200 mcg/min Intravenous Once  . simvastatin  20 mg Oral q1800   . sodium chloride 10 mL/hr at 10/29/13 0800  . heparin 1,450 Units/hr (10/29/13 0909)  . nitroGLYCERIN 2.5 mcg/min (10/29/13 0658)    OBJECTIVE: Physical Exam: Filed Vitals:   10/29/13 0500 10/29/13 0505 10/29/13 0600 10/29/13 0800  BP:  151/66  135/64  Pulse: 82 58 59   Temp:  98 F (36.7 C)  98.4 F (36.9 C)  TempSrc:  Oral  Oral  Resp: 11 13 13    Height:      Weight:      SpO2: 98% 97% 96%     Intake/Output Summary (Last 24 hours) at 10/29/13 1040 Last data filed at 10/29/13 0800  Gross per 24 hour  Intake 586.98 ml  Output    850 ml  Net -263.02 ml    Telemetry reveals sinus rhythm  GEN- The patient is well appearing, alert and oriented x 3 today.   Head- normocephalic, atraumatic Eyes-  Sclera clear, conjunctiva pink Ears- hearing intact Oropharynx- clear Neck- supple  Lungs- Clear to ausculation bilaterally, normal work of breathing Heart- Regular rate and rhythm, no murmurs, rubs or gallops, PMI not laterally displaced GI- soft, NT, ND, + BS Extremities- no clubbing, cyanosis, or edema Skin- there is a 1cm x 1cm area of induration over the R breast several cm from the areola at approximately 10-11 oclock.  This has been present for a week.  Psych- euthymic mood, full affect Neuro- strength and sensation are intact  LABS: Basic Metabolic  Panel:  Recent Labs  10/27/13 2319 10/27/13 2325 10/28/13 0540  NA 134* 138 136  K 4.5 4.5 4.6  CL 95* 100 101  CO2 24  --  23  GLUCOSE 222* 195* 115*  BUN 28* 29* 26*  CREATININE 1.79* 2.00* 1.63*  CALCIUM 9.9  --  9.0   Liver Function Tests:  Recent Labs  10/27/13 2319 10/28/13 0540  AST 22 21  ALT 11 8  ALKPHOS 57 53  BILITOT 0.2* 0.2*  PROT 7.4 6.4  ALBUMIN 3.8 3.2*   No results found for this basename: LIPASE, AMYLASE,  in the last 72 hours CBC:  Recent Labs  10/28/13 0729 10/29/13 0610  WBC 7.6 5.4  HGB 11.0* 11.1*  HCT 32.2* 32.1*  MCV 94.7 94.4  PLT 264 241   Cardiac Enzymes:  Recent Labs  10/28/13 0530 10/28/13 0905 10/28/13 1640  TROPONINI 1.82* 2.59* 1.38*   BNP: No components found with this basename: POCBNP,  D-Dimer: No results found for this basename: DDIMER,  in the last 72 hours Hemoglobin A1C:  Recent Labs  10/28/13 0540  HGBA1C 6.2*   Fasting Lipid Panel: No results found for this basename: CHOL, HDL, LDLCALC, TRIG,  CHOLHDL, LDLDIRECT,  in the last 72 hours Thyroid Function Tests:  Recent Labs  10/28/13 0540  TSH 2.685   Anemia Panel: No results found for this basename: VITAMINB12, FOLATE, FERRITIN, TIBC, IRON, RETICCTPCT,  in the last 72 hours  RADIOLOGY: Dg Bone Density  10/10/2013   EXAM: DG DEXA AXIAL SKELETON  The Bone Mineral Densitometry hard-copy report (which includes all data, graphical display, and FRAX results when applicable) has been sent directly to the ordering physician.  This report can also be obtained electronically by viewing images for this exam through the performing facility's EMR, or by logging directly into [employer].   Electronically Signed   By: Rolm Baptise M.D.   On: 10/10/2013 09:56   Dg Chest Portable 1 View  10/27/2013   CLINICAL DATA:  Chest pain for 3 days. Nausea and shortness of breath.  EXAM: PORTABLE CHEST - 1 VIEW  COMPARISON:  Chest radiograph performed 01/17/2005  FINDINGS:  The lungs are well-aerated. Pulmonary vascularity is at the upper limits of normal. Mild bibasilar opacities likely reflect atelectasis. There is no evidence of pleural effusion or pneumothorax.  The cardiomediastinal silhouette is within normal limits. No acute osseous abnormalities are seen.  IMPRESSION: Mild bibasilar opacities likely reflect atelectasis; lungs otherwise clear.   Electronically Signed   By: Garald Balding M.D.   On: 10/27/2013 23:41   ASSESSMENT:  1. NSTEMI - stable 2. Type II diet controlled DM  3. HTN - controlled  4. Dyslipidemia  5. Breast skin lesion at 10-11:00 o'clock on right breast that is excoriated - patient states she reached down to scratch it and a bunch of fluid came out of it. She will need to follow-up with primary care after discharge. 6. CKD - stable, will hydrate for cath  PLAN:  1.  Continue IV Heparin gtt and nitro gtt 2.  Continue ASA 81mg  daily and Lopressor 12.5mg  BID  3. Plan cath on Monday- risks, benefits, and alternatives to [facility] with possible PCI were discussed at length with the patient who wishes to proceed. 4. Tobacco- cessation advised  Thompson Grayer, MD 10/29/2013 10:40 AM

## 2013-10-30 ENCOUNTER — Encounter (HOSPITAL_COMMUNITY)
Admission: EM | Disposition: A | Discharge status: Home / Self Care | Payer: Self-pay | Source: Home / Self Care | Attending: Cardiovascular Disease

## 2013-10-30 DIAGNOSIS — E1129 Type 2 diabetes mellitus with other diabetic kidney complication: Secondary | ICD-10-CM

## 2013-10-30 DIAGNOSIS — I2510 Atherosclerotic heart disease of native coronary artery without angina pectoris: Secondary | ICD-10-CM

## 2013-10-30 DIAGNOSIS — N058 Unspecified nephritic syndrome with other morphologic changes: Secondary | ICD-10-CM

## 2013-10-30 HISTORY — PX: LEFT HEART CATHETERIZATION WITH CORONARY ANGIOGRAM: SHX5451

## 2013-10-30 LAB — BASIC METABOLIC PANEL
Chloride: 100 mEq/L (ref 96–112)
Creatinine, Ser: 1.72 mg/dL — ABNORMAL HIGH (ref 0.50–1.10)
GFR calc Af Amer: 34 mL/min — ABNORMAL LOW (ref >90)
GFR calc non Af Amer: 30 mL/min — ABNORMAL LOW (ref >90)
Glucose, Bld: 131 mg/dL — ABNORMAL HIGH (ref 70–99)
Potassium: 5.8 mEq/L — ABNORMAL HIGH (ref 3.5–5.1)

## 2013-10-30 LAB — GLUCOSE, CAPILLARY
Glucose-Capillary: 99 mg/dL (ref 70–99)
Glucose-Capillary: 96 mg/dL (ref 70–99)
Glucose-Capillary: 91 mg/dL (ref 70–99)
Glucose-Capillary: 135 mg/dL — ABNORMAL HIGH (ref 70–99)

## 2013-10-30 LAB — CBC
MCHC: 33.6 g/dL (ref 30.0–36.0)
Platelets: 252 10*3/uL (ref 150–400)
RDW: 13 % (ref 11.5–15.5)
WBC: 6.4 10*3/uL (ref 4.0–10.5)

## 2013-10-30 LAB — POCT ACTIVATED CLOTTING TIME: Activated Clotting Time: 121 seconds

## 2013-10-30 LAB — HEPARIN LEVEL (UNFRACTIONATED): Heparin Unfractionated: 0.48 IU/mL (ref 0.30–0.70)

## 2013-10-30 SURGERY — LEFT HEART CATHETERIZATION WITH CORONARY ANGIOGRAM
Anesthesia: LOCAL

## 2013-10-30 MED ORDER — HEPARIN (PORCINE) IN NACL 100-0.45 UNIT/ML-% IJ SOLN
1250.0000 [IU]/h | INTRAMUSCULAR | Status: DC
  Administered 2013-10-31 – 2013-11-01 (×2): 1250 [IU]/h via INTRAVENOUS
  Filled 2013-10-30 (×2): qty 250

## 2013-10-30 MED ORDER — ONDANSETRON HCL 4 MG/2ML IJ SOLN: 4.0000 mg | Freq: Four times a day (QID) | INTRAMUSCULAR | Status: DC | PRN

## 2013-10-30 MED ORDER — HEPARIN (PORCINE) IN NACL 2-0.9 UNIT/ML-% IJ SOLN
INTRAMUSCULAR | Status: AC
  Filled 2013-10-30: qty 1000

## 2013-10-30 MED ORDER — MIDAZOLAM HCL 2 MG/2ML IJ SOLN
INTRAMUSCULAR | Status: AC
  Filled 2013-10-30: qty 2

## 2013-10-30 MED ORDER — LIDOCAINE HCL (PF) 1 % IJ SOLN
INTRAMUSCULAR | Status: AC
  Filled 2013-10-30: qty 30

## 2013-10-30 MED ORDER — SODIUM CHLORIDE 0.9 % IV SOLN
INTRAVENOUS | Status: AC
  Administered 2013-10-30: 75 mL/h via INTRAVENOUS

## 2013-10-30 NOTE — Progress Notes (Signed)
SUBJECTIVE: The patient is doing well today.  At this time, she denies chest pain, shortness of breath, or any new concerns.  Marland Kitchen amitriptyline  25 mg Oral QHS  . amLODipine  5 mg Oral Daily  . aspirin EC  81 mg Oral Daily  . benazepril  20 mg Oral Daily  . buPROPion  300 mg Oral Daily  . darifenacin  15 mg Oral Daily  . DULoxetine  60 mg Oral BID  . famotidine  20 mg Oral BID  . levothyroxine  50 mcg Oral QAC breakfast  . loratadine  10 mg Oral Daily  . metoprolol tartrate  12.5 mg Oral BID  . nicotine  14 mg Transdermal Q0600  . nitroGLYCERIN  5-200 mcg/min Intravenous Once  . simvastatin  20 mg Oral q1800  . sodium chloride  3 mL Intravenous Q12H   . sodium chloride 10 mL/hr at 10/29/13 2200  . sodium chloride 1 mL/kg/hr (10/30/13 0649)  . heparin 1,250 Units/hr (10/30/13 0649)  . nitroGLYCERIN 2.667 mcg/min (10/29/13 2000)    OBJECTIVE: Physical Exam: Filed Vitals:   10/29/13 1950 10/29/13 2000 10/29/13 2348 10/30/13 0400  BP:  128/55    Pulse:      Temp: 98.1 F (36.7 C) 98.1 F (36.7 C) 98.6 F (37 C) 98.5 F (36.9 C)  TempSrc: Oral Oral Oral Oral  Resp:      Height:      Weight:      SpO2: 96% 96% 94%     Intake/Output Summary (Last 24 hours) at 10/30/13 0737 Last data filed at 10/30/13 0500  Gross per 24 hour  Intake 1298.49 ml  Output      0 ml  Net 1298.49 ml    Telemetry reveals sinus rhythm  GEN- The patient is well appearing, alert and oriented x 3 today.   Head- normocephalic, atraumatic Eyes-  Sclera clear, conjunctiva pink Ears- hearing intact Oropharynx- clear Neck- supple  Lungs- Clear to ausculation bilaterally,   Heart- RRm  no murmurs, rubs or gallops, PMI not laterally displaced GI- soft, NT, ND, + BS Extremities- no clubbing, cyanosis, or edema     Psych- euthymic mood, full affect Neuro- strength and sensation are intact  LABS: Basic Metabolic Panel:  Recent Labs  10/28/13 0540 10/30/13 0520  NA 136 134*  K 4.6 5.8*    CL 101 100  CO2 23 19  GLUCOSE 115* 131*  BUN 26* 29*  CREATININE 1.63* 1.72*  CALCIUM 9.0 9.2   Liver Function Tests:  Recent Labs  10/27/13 2319 10/28/13 0540  AST 22 21  ALT 11 8  ALKPHOS 57 53  BILITOT 0.2* 0.2*  PROT 7.4 6.4  ALBUMIN 3.8 3.2*   No results found for this basename: LIPASE, AMYLASE,  in the last 72 hours CBC:  Recent Labs  10/29/13 0610 10/30/13 0520  WBC 5.4 6.4  HGB 11.1* 11.1*  HCT 32.1* 33.0*  MCV 94.4 94.6  PLT 241 252   Cardiac Enzymes:  Recent Labs  10/28/13 0530 10/28/13 0905 10/28/13 1640  TROPONINI 1.82* 2.59* 1.38*   BNP: No components found with this basename: POCBNP,  D-Dimer: No results found for this basename: DDIMER,  in the last 72 hours Hemoglobin A1C:  Recent Labs  10/28/13 0540  HGBA1C 6.2*   Fasting Lipid Panel: No results found for this basename: CHOL, HDL, LDLCALC, TRIG, CHOLHDL, LDLDIRECT,  in the last 72 hours Thyroid Function Tests:  Recent Labs  10/28/13 0540  TSH 2.685  Anemia Panel: No results found for this basename: VITAMINB12, FOLATE, FERRITIN, TIBC, IRON, RETICCTPCT,  in the last 72 hours  RADIOLOGY: Dg Bone Density  10/10/2013   EXAM: DG DEXA AXIAL SKELETON  The Bone Mineral Densitometry hard-copy report (which includes all data, graphical display, and FRAX results when applicable) has been sent directly to the ordering physician.  This report can also be obtained electronically by viewing images for this exam through the performing facility's EMR, or by logging directly into [employer].   Electronically Signed   By: Rolm Baptise M.D.   On: 10/10/2013 09:56   Dg Chest Portable 1 View  10/27/2013   CLINICAL DATA:  Chest pain for 3 days. Nausea and shortness of breath.  EXAM: PORTABLE CHEST - 1 VIEW  COMPARISON:  Chest radiograph performed 01/17/2005  FINDINGS: The lungs are well-aerated. Pulmonary vascularity is at the upper limits of normal. Mild bibasilar opacities likely reflect  atelectasis. There is no evidence of pleural effusion or pneumothorax.  The cardiomediastinal silhouette is within normal limits. No acute osseous abnormalities are seen.  IMPRESSION: Mild bibasilar opacities likely reflect atelectasis; lungs otherwise clear.   Electronically Signed   By: Garald Balding M.D.   On: 10/27/2013 23:41   ASSESSMENT:  1. NSTEMI - stable 2. Type II diet controlled DM  3. HTN - controlled  4. Dyslipidemia  5. Breast skin lesion at 10-11:00 o'clock on right breast that is excoriated - patient states she reached down to scratch it and a bunch of fluid came out of it. She will need to follow-up with primary care after discharge. 6. CKD - stable, will hydrate for cath  PLAN:  1.  Continue IV Heparin gtt and nitro gtt 2.  Continue ASA 81mg  daily and Lopressor 12.5mg  BID  3. Plan cath on Monday- risks, benefits, and alternatives to [facility] with possible PCI were discussed at length with the patient who wishes to proceed. 4. Tobacco- cessation advised  Darden Amber., MD 10/30/2013 7:37 AM

## 2013-10-30 NOTE — Progress Notes (Signed)
Utilization Review Completed.  

## 2013-10-30 NOTE — Progress Notes (Signed)
ANTICOAGULATION CONSULT NOTE - Follow Up Consult  Pharmacy Consult for Heparin  Indication: chest pain/ACS  Allergies  Allergen Reactions  . Meperidine Hcl Nausea And Vomiting  . Niacin Other (See Comments)    REACTION: face peeling and burning  . Propoxyphene-Acetaminophen Nausea And Vomiting   Patient Measurements: Height: 5\' 5"  (165.1 cm) Weight: 211 lb 6.7 oz (95.9 kg) IBW/kg (Calculated) : 57 Heparin Dosing Weight: 85 kg  Vital Signs: Temp: 98.3 F (36.8 C) (12/15 1237) Temp src: Oral (12/15 1237) BP: 121/58 mmHg (12/15 1237) Pulse Rate: 61 (12/15 1356)  Labs:  Recent Labs  10/27/13 2319 10/27/13 2325 10/28/13 0530 10/28/13 0540  10/28/13 0729 10/28/13 0905 10/28/13 1640  10/29/13 0610 10/29/13 1530 10/30/13 0520  HGB 12.0 12.6  --   --   --  11.0*  --   --   --  11.1*  --  11.1*  HCT 36.0 37.0  --   --   --  32.2*  --   --   --  32.1*  --  33.0*  PLT 329  --   --   --   --  264  --   --   --  241  --  252  APTT 26  --   --  52*  --   --   --   --   --   --   --   --   LABPROT 13.4  --   --  14.1  --   --   --   --   --   --   --   --   INR 1.04  --   --  1.11  --   --   --   --   --   --   --   --   HEPARINUNFRC  --   --   --   --   < > 0.25*  --  0.24*  < > 0.94* 0.90* 0.48  CREATININE 1.79* 2.00*  --  1.63*  --   --   --   --   --   --   --  1.72*  TROPONINI  --   --  1.82*  --   --   --  2.59* 1.38*  --   --   --   --   < > = values in this interval not displayed.  Estimated Creatinine Clearance: 36.4 ml/min (by C-G formula based on Cr of 1.72).   Assessment: 67 y/o F on heparin for CP. Heparin level after rate decrease was 0.48 this morning.  Now she is s/p cath lab and heparin is ordered to resume 8 hr after sheath out.  Cath determined severe 3VCAD and TCTS consulted for possible CABG.  Goal of Therapy:  Heparin level 0.3-0.7 units/ml Monitor platelets by anticoagulation protocol: Yes   Plan:  -resume heparin at 1250 units/hr at 2300 tonight  per post-cath order -Daily CBC/HL -Monitor for bleeding Eudelia Bunch, Pharm.D. [id number] 10/30/2013 3:37 PM

## 2013-10-30 NOTE — Progress Notes (Signed)
ANTICOAGULATION CONSULT NOTE - Follow Up Consult  Pharmacy Consult for Heparin  Indication: chest pain/ACS  Allergies  Allergen Reactions  . Meperidine Hcl Nausea And Vomiting  . Niacin Other (See Comments)    REACTION: face peeling and burning  . Propoxyphene-Acetaminophen Nausea And Vomiting   Patient Measurements: Height: 5\' 5"  (165.1 cm) Weight: 211 lb 6.7 oz (95.9 kg) IBW/kg (Calculated) : 57 Heparin Dosing Weight: 85 kg  Vital Signs: Temp: 98.5 F (36.9 C) (12/15 0400) Temp src: Oral (12/15 0400) BP: 128/55 mmHg (12/14 2000)  Labs:  Recent Labs  10/27/13 2319 10/27/13 2325 10/28/13 0530 10/28/13 0540  10/28/13 0729 10/28/13 0905 10/28/13 1640  10/29/13 0610 10/29/13 1530 10/30/13 0520  HGB 12.0 12.6  --   --   --  11.0*  --   --   --  11.1*  --  11.1*  HCT 36.0 37.0  --   --   --  32.2*  --   --   --  32.1*  --  33.0*  PLT 329  --   --   --   --  264  --   --   --  241  --  252  APTT 26  --   --  52*  --   --   --   --   --   --   --   --   LABPROT 13.4  --   --  14.1  --   --   --   --   --   --   --   --   INR 1.04  --   --  1.11  --   --   --   --   --   --   --   --   HEPARINUNFRC  --   --   --   --   < > 0.25*  --  0.24*  < > 0.94* 0.90* 0.48  CREATININE 1.79* 2.00*  --  1.63*  --   --   --   --   --   --   --   --   TROPONINI  --   --  1.82*  --   --   --  2.59* 1.38*  --   --   --   --   < > = values in this interval not displayed.  Estimated Creatinine Clearance: 38.4 ml/min (by C-G formula based on Cr of 1.63).   Medications:  Heparin 1250 units/hr  Assessment: 67 y/o F on heparin for CP. Heparin level after rate decrease is 0.48. Other labs as above.   Goal of Therapy:  Heparin level 0.3-0.7 units/ml Monitor platelets by anticoagulation protocol: Yes   Plan:  -Continue heparin at 1250 units/hr -8 hour HL at 1400 -Daily CBC/HL -Monitor for bleeding  Thank you for allowing me to take part in this patient's care,  Narda Bonds,  PharmD Clinical Pharmacist Phone: [phone] Pager: [phone] 10/30/2013 6:24 AM

## 2013-10-30 NOTE — Interval H&P Note (Signed)
Cath Lab Visit (complete for each Cath Lab visit)  Clinical Evaluation Leading to the Procedure:   ACS: no  Non-ACS:    Anginal Classification: CCS IV  Anti-ischemic medical therapy: Minimal Therapy (1 class of medications)  Non-Invasive Test Results: No non-invasive testing performed  Prior CABG: No previous CABG      History and Physical Interval Note:  10/30/2013 2:10 PM  Tammy Suarez  has presented today for surgery, with the diagnosis of cp  The various methods of treatment have been discussed with the patient and family. After consideration of risks, benefits and other options for treatment, the patient has consented to  Procedure(s): LEFT HEART CATHETERIZATION WITH CORONARY ANGIOGRAM (N/A) as a surgical intervention .  The patient's history has been reviewed, patient examined, no change in status, stable for surgery.  I have reviewed the patient's chart and labs.  Questions were answered to the patient's satisfaction.     Minus Breeding

## 2013-10-30 NOTE — CV Procedure (Signed)
   Cardiac Catheterization Procedure Note  Name: ARIANY BROWDY MRN: [id number] DOB: 07-20-1946  Procedure: Left Heart Cath, Selective Coronary Angiography  Indication: NQWMI  Procedural details: The right groin was prepped, draped, and anesthetized with 1% lidocaine. Using modified Seldinger technique, a 5 French sheath was introduced into the right femoral artery. Standard Judkins catheters were used for coronary angiography and left ventriculography. Catheter exchanges were performed over a guidewire. There were no immediate procedural complications. The patient was transferred to the post catheterization recovery area for further monitoring.  Procedural Findings:   Hemodynamics:     AO 138/62    LV 134/9   Coronary angiography:   Coronary dominance: Right  Left mainstem:   Mild luminal irregularities.    Left anterior descending (LAD):   Small vessel after D1.  Mid 80% after the D1.  The D1 is a large vessel that splits at the ostium.  The superior branch has long 25%.  There is moderate calcification in the proximal and mid vessel.    Left circumflex (LCx):  Prox occlusion.  MOM is incompletely filled via LAD collaterals and appears to be a moderate sized vessel.  Right coronary artery (RCA):  Very large dominant vessel.  There are two focal heavily calcified segments in the proximal vessel with probable high grade stenosis (80%).    Left ventriculography: Left ventricle was not injected secondary to CKD.  We did cross for pressures however.  Final Conclusions:   Severe 3 vessel CAD.    Recommendations:   Consult [facility] for consideration of CABG.    Minus Breeding 10/30/2013, 2:32 PM

## 2013-10-30 NOTE — H&P (View-Only) (Signed)
SUBJECTIVE: The patient is doing well today.  At this time, she denies chest pain, shortness of breath, or any new concerns.  Marland Kitchen amitriptyline  25 mg Oral QHS  . amLODipine  5 mg Oral Daily  . aspirin EC  81 mg Oral Daily  . benazepril  20 mg Oral Daily  . buPROPion  300 mg Oral Daily  . darifenacin  15 mg Oral Daily  . DULoxetine  60 mg Oral BID  . famotidine  20 mg Oral BID  . levothyroxine  50 mcg Oral QAC breakfast  . loratadine  10 mg Oral Daily  . metoprolol tartrate  12.5 mg Oral BID  . nicotine  14 mg Transdermal Q0600  . nitroGLYCERIN  5-200 mcg/min Intravenous Once  . simvastatin  20 mg Oral q1800  . sodium chloride  3 mL Intravenous Q12H   . sodium chloride 10 mL/hr at 10/29/13 2200  . sodium chloride 1 mL/kg/hr (10/30/13 0649)  . heparin 1,250 Units/hr (10/30/13 0649)  . nitroGLYCERIN 2.667 mcg/min (10/29/13 2000)    OBJECTIVE: Physical Exam: Filed Vitals:   10/29/13 1950 10/29/13 2000 10/29/13 2348 10/30/13 0400  BP:  128/55    Pulse:      Temp: 98.1 F (36.7 C) 98.1 F (36.7 C) 98.6 F (37 C) 98.5 F (36.9 C)  TempSrc: Oral Oral Oral Oral  Resp:      Height:      Weight:      SpO2: 96% 96% 94%     Intake/Output Summary (Last 24 hours) at 10/30/13 0737 Last data filed at 10/30/13 0500  Gross per 24 hour  Intake 1298.49 ml  Output      0 ml  Net 1298.49 ml    Telemetry reveals sinus rhythm  GEN- The patient is well appearing, alert and oriented x 3 today.   Head- normocephalic, atraumatic Eyes-  Sclera clear, conjunctiva pink Ears- hearing intact Oropharynx- clear Neck- supple  Lungs- Clear to ausculation bilaterally,   Heart- RRm  no murmurs, rubs or gallops, PMI not laterally displaced GI- soft, NT, ND, + BS Extremities- no clubbing, cyanosis, or edema     Psych- euthymic mood, full affect Neuro- strength and sensation are intact  LABS: Basic Metabolic Panel:  Recent Labs  10/28/13 0540 10/30/13 0520  NA 136 134*  K 4.6 5.8*    CL 101 100  CO2 23 19  GLUCOSE 115* 131*  BUN 26* 29*  CREATININE 1.63* 1.72*  CALCIUM 9.0 9.2   Liver Function Tests:  Recent Labs  10/27/13 2319 10/28/13 0540  AST 22 21  ALT 11 8  ALKPHOS 57 53  BILITOT 0.2* 0.2*  PROT 7.4 6.4  ALBUMIN 3.8 3.2*   No results found for this basename: LIPASE, AMYLASE,  in the last 72 hours CBC:  Recent Labs  10/29/13 0610 10/30/13 0520  WBC 5.4 6.4  HGB 11.1* 11.1*  HCT 32.1* 33.0*  MCV 94.4 94.6  PLT 241 252   Cardiac Enzymes:  Recent Labs  10/28/13 0530 10/28/13 0905 10/28/13 1640  TROPONINI 1.82* 2.59* 1.38*   BNP: No components found with this basename: POCBNP,  D-Dimer: No results found for this basename: DDIMER,  in the last 72 hours Hemoglobin A1C:  Recent Labs  10/28/13 0540  HGBA1C 6.2*   Fasting Lipid Panel: No results found for this basename: CHOL, HDL, LDLCALC, TRIG, CHOLHDL, LDLDIRECT,  in the last 72 hours Thyroid Function Tests:  Recent Labs  10/28/13 0540  TSH 2.685  Anemia Panel: No results found for this basename: VITAMINB12, FOLATE, FERRITIN, TIBC, IRON, RETICCTPCT,  in the last 72 hours  RADIOLOGY: Dg Bone Density  10/10/2013   EXAM: DG DEXA AXIAL SKELETON  The Bone Mineral Densitometry hard-copy report (which includes all data, graphical display, and FRAX results when applicable) has been sent directly to the ordering physician.  This report can also be obtained electronically by viewing images for this exam through the performing facility's EMR, or by logging directly into [employer].   Electronically Signed   By: Rolm Baptise M.D.   On: 10/10/2013 09:56   Dg Chest Portable 1 View  10/27/2013   CLINICAL DATA:  Chest pain for 3 days. Nausea and shortness of breath.  EXAM: PORTABLE CHEST - 1 VIEW  COMPARISON:  Chest radiograph performed 01/17/2005  FINDINGS: The lungs are well-aerated. Pulmonary vascularity is at the upper limits of normal. Mild bibasilar opacities likely reflect  atelectasis. There is no evidence of pleural effusion or pneumothorax.  The cardiomediastinal silhouette is within normal limits. No acute osseous abnormalities are seen.  IMPRESSION: Mild bibasilar opacities likely reflect atelectasis; lungs otherwise clear.   Electronically Signed   By: Garald Balding M.D.   On: 10/27/2013 23:41   ASSESSMENT:  1. NSTEMI - stable 2. Type II diet controlled DM  3. HTN - controlled  4. Dyslipidemia  5. Breast skin lesion at 10-11:00 o'clock on right breast that is excoriated - patient states she reached down to scratch it and a bunch of fluid came out of it. She will need to follow-up with primary care after discharge. 6. CKD - stable, will hydrate for cath  PLAN:  1.  Continue IV Heparin gtt and nitro gtt 2.  Continue ASA 81mg  daily and Lopressor 12.5mg  BID  3. Plan cath on Monday- risks, benefits, and alternatives to [facility] with possible PCI were discussed at length with the patient who wishes to proceed. 4. Tobacco- cessation advised  Darden Amber., MD 10/30/2013 7:37 AM

## 2013-10-31 ENCOUNTER — Other Ambulatory Visit: Payer: Self-pay | Admitting: *Deleted

## 2013-10-31 ENCOUNTER — Inpatient Hospital Stay (HOSPITAL_COMMUNITY): Payer: Medicare Other

## 2013-10-31 DIAGNOSIS — Z01810 Encounter for preprocedural cardiovascular examination: Secondary | ICD-10-CM

## 2013-10-31 DIAGNOSIS — I2510 Atherosclerotic heart disease of native coronary artery without angina pectoris: Secondary | ICD-10-CM

## 2013-10-31 DIAGNOSIS — R918 Other nonspecific abnormal finding of lung field: Secondary | ICD-10-CM

## 2013-10-31 LAB — PULMONARY FUNCTION TEST
FEF 25-75 Post: 1.69 L/sec
FEF 25-75 Pre: 1.24 L/sec
FEF2575-%Change-Post: 36 %
FEF2575-%Pred-Pre: 60 %
FEV1-%Pred-Post: 76 %
FEV1-%Pred-Pre: 70 %
FEV1-Post: 1.87 L
FEV1FVC-%Change-Post: 1 %
FEV1FVC-%Pred-Pre: 93 %
FEV6-%Change-Post: 5 %
FEV6-%Pred-Post: 83 %
FEV6-%Pred-Pre: 78 %
FEV6-Post: 2.54 L
FEV6-Pre: 2.41 L
FEV6FVC-%Pred-Post: 102 %
FVC-%Pred-Pre: 75 %
FVC-Pre: 2.42 L
Post FEV1/FVC ratio: 72 %
Post FEV6/FVC ratio: 99 %
Pre FEV1/FVC ratio: 71 %
Pre FEV6/FVC Ratio: 100 %

## 2013-10-31 LAB — CBC
HCT: 35.8 % — ABNORMAL LOW (ref 36.0–46.0)
MCHC: 33.8 g/dL (ref 30.0–36.0)
Platelets: 257 10*3/uL (ref 150–400)
RBC: 3.78 MIL/uL — ABNORMAL LOW (ref 3.87–5.11)
RDW: 12.9 % (ref 11.5–15.5)
WBC: 6.2 10*3/uL (ref 4.0–10.5)

## 2013-10-31 LAB — GLUCOSE, CAPILLARY
Glucose-Capillary: 107 mg/dL — ABNORMAL HIGH (ref 70–99)
Glucose-Capillary: 130 mg/dL — ABNORMAL HIGH (ref 70–99)

## 2013-10-31 MED ORDER — GUAIFENESIN 100 MG/5ML PO SYRP
200.0000 mg | ORAL_SOLUTION | ORAL | Status: DC | PRN
  Administered 2013-10-31: 200 mg via ORAL
  Filled 2013-10-31 (×3): qty 10

## 2013-10-31 MED ORDER — ALBUTEROL SULFATE (5 MG/ML) 0.5% IN NEBU
2.5000 mg | INHALATION_SOLUTION | Freq: Once | RESPIRATORY_TRACT | Status: AC
  Administered 2013-10-31: 2.5 mg via RESPIRATORY_TRACT

## 2013-10-31 NOTE — Consult Note (Signed)
[address]       [place] [address]             [phone]       Reason for Consult: Severe Multi-vessel coronary disease s/p STEMI Referring Physician: Dr. Minus Breeding  Tammy Suarez is an 67 y.o. female.  HPI:   The patient is a 67 year old smoker with a hx of diabetes, HTN, hyperlipidemia, chronic kidney disease, and morbid obesity s/p gastric banding 4 years ago with 100 lb weight loss. She reports being in her usual state of health until 3 days prior to admission when she began having intermittent left sided chest pain radiating into the left jaw and left arm associated with exertional dyspnea. This pain was mostly in bed at night and not exertionally related. She also reports numbness in the left arm intermittently for 3 weeks. She ruled in for a NSTEMI with an initial Troponin poc of 0.89, peaking at 2.59 the next day. Cath shows severe 3- vessel coronary disease. LV gram was not done due to renal dysfunction. An echo has not been done.  Past Medical History  Diagnosis Date  . Vitamin B 12 deficiency   . Chronic back pain   . GERD (gastroesophageal reflux disease)   . Nicotine addiction   . Hyperlipidemia   . Obesity   . Hypertension   . Type 2 diabetes mellitus without complications   . Conjunctivitis, acute   . CKD (chronic kidney disease)     Past Surgical History  Procedure Laterality Date  . Cervical laminectomy    . Cholecystectomy    . Inguinal herniorrhaphy    . Vesicovaginal fistula closure w/ tah    . Lumbar fusion    . Left index finger repair    . Laparoscopic gastric banding  2009  . Cosmetic surgery  07/01/2012    recurrent skin infection on lower abdomen, [facility]  . Abdominal hysterectomy    . Spine surgery      Family History  Problem Relation Age of Onset  . Heart attack Mother   . Heart attack Father   . Hypertension Sister   . Heart attack Sister   . Cancer Brother     renal cell     Social History:   reports that she has been smoking Cigarettes.  She has been smoking about 1.00 pack per day. She has never used smokeless tobacco. She reports that she does not drink alcohol or use illicit drugs.  Allergies:  Allergies  Allergen Reactions  . Meperidine Hcl Nausea And Vomiting  . Niacin Other (See Comments)    REACTION: face peeling and burning  . Propoxyphene-Acetaminophen Nausea And Vomiting    Medications:  I have reviewed the patient's current medications. Prior to Admission:  Prescriptions prior to admission  Medication Sig Dispense Refill  . amitriptyline (ELAVIL) 25 MG tablet Take 25 mg by mouth at bedtime.      Marland Kitchen amLODipine (NORVASC) 5 MG tablet Take 5 mg by mouth daily.      Marland Kitchen aspirin (BAYER ASPIRIN EC LOW DOSE) 81 MG EC tablet Take 81 mg by mouth daily.        . benazepril (LOTENSIN) 20 MG tablet Take 1 tablet (20 mg total) by mouth daily.  30 tablet  5  . buPROPion (WELLBUTRIN XL) 300 MG 24 hr tablet Take 1 tablet (300 mg total) by mouth every morning.  90 tablet  1  .  clonazePAM (KLONOPIN) 1 MG tablet Take 1 mg by mouth 3 (three) times daily as needed for anxiety.      . DULoxetine (CYMBALTA) 60 MG capsule Take 60 mg by mouth 2 (two) times daily.      Marland Kitchen HYDROcodone-acetaminophen (NORCO) 10-325 MG per tablet Take 1 tablet by mouth 4 (four) times daily.  120 tablet  0  . levothyroxine (SYNTHROID, LEVOTHROID) 50 MCG tablet Take 1 tablet (50 mcg total) by mouth daily.  30 tablet  3  . loratadine (CLARITIN) 10 MG tablet Take 10 mg by mouth daily as needed for allergies.      . naproxen (NAPROSYN) 500 MG tablet Take 500 mg by mouth 2 (two) times daily with a meal.      . Omega-3 Fatty Acids (FISH OIL PO) Take 1 capsule by mouth daily.      . simvastatin (ZOCOR) 20 MG tablet Take 20 mg by mouth daily.      . solifenacin (VESICARE) 10 MG tablet Take 10 mg by mouth daily.       Scheduled: . amitriptyline  25 mg Oral QHS  . amLODipine  5 mg Oral Daily  . aspirin EC  81 mg Oral  Daily  . benazepril  20 mg Oral Daily  . buPROPion  300 mg Oral Daily  . darifenacin  15 mg Oral Daily  . DULoxetine  60 mg Oral BID  . famotidine  20 mg Oral BID  . levothyroxine  50 mcg Oral QAC breakfast  . loratadine  10 mg Oral Daily  . metoprolol tartrate  12.5 mg Oral BID  . nicotine  14 mg Transdermal Q0600  . nitroGLYCERIN  5-200 mcg/min Intravenous Once  . simvastatin  20 mg Oral q1800  . sodium chloride  3 mL Intravenous Q12H   Continuous: . sodium chloride 10 mL/hr at 10/29/13 2200  . heparin 1,250 Units/hr (10/31/13 1432)  . nitroGLYCERIN 2.667 mcg/min (10/29/13 2000)   [id number] chloride, acetaminophen, clonazePAM, nitroGLYCERIN, ondansetron (ZOFRAN) IV, pneumococcal 23 valent vaccine, sodium chloride Anti-infectives   None      Results for orders placed during the hospital encounter of 10/27/13 (from the past 48 hour(s))  GLUCOSE, CAPILLARY     Status: Abnormal   Collection Time    10/29/13  4:43 PM      Result Value Range   Glucose-Capillary 130 (*) 70 - 99 mg/dL  GLUCOSE, CAPILLARY     Status: Abnormal   Collection Time    10/29/13  9:35 PM      Result Value Range   Glucose-Capillary 123 (*) 70 - 99 mg/dL   Comment 1 Documented in Chart     Comment 2 Notify RN    HEPARIN LEVEL (UNFRACTIONATED)     Status: None   Collection Time    10/30/13  5:20 AM      Result Value Range   Heparin Unfractionated 0.48  0.30 - 0.70 IU/mL   Comment:            IF HEPARIN RESULTS ARE BELOW     EXPECTED VALUES, AND PATIENT     DOSAGE HAS BEEN CONFIRMED,     SUGGEST FOLLOW UP TESTING     OF ANTITHROMBIN III LEVELS.  CBC     Status: Abnormal   Collection Time    10/30/13  5:20 AM      Result Value Range   WBC 6.4  4.0 - 10.5 K/uL   RBC 3.49 (*) 3.87 - 5.11 MIL/uL  Hemoglobin 11.1 (*) 12.0 - 15.0 g/dL   HCT 33.0 (*) 36.0 - 46.0 %   MCV 94.6  78.0 - 100.0 fL   MCH 31.8  26.0 - 34.0 pg   MCHC 33.6  30.0 - 36.0 g/dL   RDW 13.0  11.5 - 15.5 %   Platelets 252  150 -  400 K/uL  BASIC METABOLIC PANEL     Status: Abnormal   Collection Time    10/30/13  5:20 AM      Result Value Range   Sodium 134 (*) 135 - 145 mEq/L   Potassium 5.8 (*) 3.5 - 5.1 mEq/L   Comment: HEMOLYSIS AT THIS LEVEL MAY AFFECT RESULT   Chloride 100  96 - 112 mEq/L   CO2 19  19 - 32 mEq/L   Glucose, Bld 131 (*) 70 - 99 mg/dL   BUN 29 (*) 6 - 23 mg/dL   Creatinine, Ser 1.72 (*) 0.50 - 1.10 mg/dL   Calcium 9.2  8.4 - 10.5 mg/dL   GFR calc non Af Amer 30 (*) >90 mL/min   GFR calc Af Amer 34 (*) >90 mL/min   Comment: (NOTE)     The eGFR has been calculated using the CKD EPI equation.     This calculation has not been validated in all clinical situations.     eGFR's persistently <90 mL/min signify possible Chronic Kidney     Disease.  POTASSIUM     Status: None   Collection Time    10/30/13 11:05 AM      Result Value Range   Potassium 4.8  3.5 - 5.1 mEq/L  GLUCOSE, CAPILLARY     Status: None   Collection Time    10/30/13 12:37 PM      Result Value Range   Glucose-Capillary 99  70 - 99 mg/dL  POCT ACTIVATED CLOTTING TIME     Status: None   Collection Time    10/30/13  2:33 PM      Result Value Range   Activated Clotting Time 121    GLUCOSE, CAPILLARY     Status: None   Collection Time    10/30/13  2:58 PM      Result Value Range   Glucose-Capillary 96  70 - 99 mg/dL  GLUCOSE, CAPILLARY     Status: None   Collection Time    10/30/13  4:42 PM      Result Value Range   Glucose-Capillary 91  70 - 99 mg/dL  GLUCOSE, CAPILLARY     Status: Abnormal   Collection Time    10/30/13  9:44 PM      Result Value Range   Glucose-Capillary 135 (*) 70 - 99 mg/dL  CBC     Status: Abnormal   Collection Time    10/31/13  8:10 AM      Result Value Range   WBC 6.2  4.0 - 10.5 K/uL   RBC 3.78 (*) 3.87 - 5.11 MIL/uL   Hemoglobin 12.1  12.0 - 15.0 g/dL   HCT 35.8 (*) 36.0 - 46.0 %   MCV 94.7  78.0 - 100.0 fL   MCH 32.0  26.0 - 34.0 pg   MCHC 33.8  30.0 - 36.0 g/dL   RDW 12.9  11.5 -  15.5 %   Platelets 257  150 - 400 K/uL  HEPARIN LEVEL (UNFRACTIONATED)     Status: None   Collection Time    10/31/13  8:10 AM  Result Value Range   Heparin Unfractionated 0.48  0.30 - 0.70 IU/mL   Comment:            IF HEPARIN RESULTS ARE BELOW     EXPECTED VALUES, AND PATIENT     DOSAGE HAS BEEN CONFIRMED,     SUGGEST FOLLOW UP TESTING     OF ANTITHROMBIN III LEVELS.  GLUCOSE, CAPILLARY     Status: Abnormal   Collection Time    10/31/13  8:18 AM      Result Value Range   Glucose-Capillary 107 (*) 70 - 99 mg/dL    No results found.  Review of Systems  Constitutional: Positive for weight loss. Negative for fever, chills, malaise/fatigue and diaphoresis.  HENT: Negative.   Eyes: Negative.   Respiratory: Positive for shortness of breath. Negative for cough and sputum production.   Cardiovascular: Positive for chest pain. Negative for leg swelling and PND.       No orthopnea   Gastrointestinal: Negative.   Genitourinary: Negative.   Musculoskeletal: Negative.   Skin: Negative.   Neurological: Negative.   Endo/Heme/Allergies: Negative.   Psychiatric/Behavioral: Negative.    Blood pressure 120/71, pulse 50, temperature 98.3 F (36.8 C), temperature source Oral, resp. rate 18, height 5\' 5"  (1.651 m), weight 95.9 kg (211 lb 6.7 oz), SpO2 97.00%. Physical Exam  Constitutional: She is oriented to person, place, and time.  Obese white female in no distress  HENT:  Head: Normocephalic and atraumatic.  Mouth/Throat: Oropharynx is clear and moist.  Eyes: EOM are normal. Pupils are equal, round, and reactive to light.  Neck: Normal range of motion. Neck supple. No JVD present. No thyromegaly present.  Cardiovascular: Normal rate, regular rhythm, normal heart sounds and intact distal pulses.  Exam reveals no gallop and no friction rub.   No murmur heard. Respiratory: Effort normal and breath sounds normal. No respiratory distress. She has no wheezes. She has no rales. She  exhibits no tenderness.  GI: Soft. Bowel sounds are normal. She exhibits no distension and no mass. There is no tenderness.  Multiple old scars from prior surgery  Musculoskeletal: Normal range of motion. She exhibits no edema.  Lymphadenopathy:    She has no cervical adenopathy.  Neurological: She is alert and oriented to person, place, and time. She has normal strength. No cranial nerve deficit or sensory deficit.  Skin: Skin is warm and dry.  Psychiatric: She has a normal mood and affect.    Cardiac Catheterization Procedure Note  Name: Tammy Suarez  MRN: [id number]  DOB: 1946/07/11  Procedure: Left Heart Cath, Selective Coronary Angiography  Indication: NQWMI  Procedural details: The right groin was prepped, draped, and anesthetized with 1% lidocaine. Using modified Seldinger technique, a 5 French sheath was introduced into the right femoral artery. Standard Judkins catheters were used for coronary angiography and left ventriculography. Catheter exchanges were performed over a guidewire. There were no immediate procedural complications. The patient was transferred to the post catheterization recovery area for further monitoring.  Procedural Findings:  Hemodynamics:  AO 138/62  LV 134/9  Coronary angiography:  Coronary dominance: Right  Left mainstem: Mild luminal irregularities.  Left anterior descending (LAD): Small vessel after D1. Mid 80% after the D1. The D1 is a large vessel that splits at the ostium. The superior branch has long 25%. There is moderate calcification in the proximal and mid vessel.  Left circumflex (LCx): Prox occlusion. MOM is incompletely filled via LAD collaterals and appears to be a moderate  sized vessel.  Right coronary artery (RCA): Very large dominant vessel. There are two focal heavily calcified segments in the proximal vessel with probable high grade stenosis (80%).  Left ventriculography: Left ventricle was not injected secondary to CKD. We did cross  for pressures however.  Final Conclusions: Severe 3 vessel CAD.  Recommendations: Consult [facility] for consideration of CABG.  Minus Breeding  10/30/2013, 2:32 PM   Assessment/Plan:  She has severe multi-vessel coronary disease with an occluded LCX, high grade LAD after large D1, and heavily calcified, high grade RCA stenoses. With a hx of diabetes I agree that CABG is the best treatment for her to prevent further ischemia and infarction and to improve her quality of life. She will need an echo to assess her LV function. If her creatinine remains stable I will plan to do surgery Thursday. I discussed the operative procedure with the patient including alternatives, benefits and risks; including but not limited to bleeding, blood transfusion, infection, stroke, myocardial infarction, graft failure, heart block requiring a permanent pacemaker, organ dysfunction, and death.  Tammy Suarez understands and agrees to proceed.    Gaye Pollack 10/31/2013, 4:21 PM

## 2013-10-31 NOTE — Progress Notes (Signed)
SUBJECTIVE: The patient is doing well today.  At this time, she denies chest pain, shortness of breath, or any new concerns.  Cath revealed 3 V CAD - not very favorable for PCI.    TCTS has been consulted   . amitriptyline  25 mg Oral QHS  . amLODipine  5 mg Oral Daily  . aspirin EC  81 mg Oral Daily  . benazepril  20 mg Oral Daily  . buPROPion  300 mg Oral Daily  . darifenacin  15 mg Oral Daily  . DULoxetine  60 mg Oral BID  . famotidine  20 mg Oral BID  . levothyroxine  50 mcg Oral QAC breakfast  . loratadine  10 mg Oral Daily  . metoprolol tartrate  12.5 mg Oral BID  . nicotine  14 mg Transdermal Q0600  . nitroGLYCERIN  5-200 mcg/min Intravenous Once  . simvastatin  20 mg Oral q1800  . sodium chloride  3 mL Intravenous Q12H   . sodium chloride 10 mL/hr at 10/29/13 2200  . heparin 1,250 Units/hr (10/30/13 2318)  . nitroGLYCERIN 2.667 mcg/min (10/29/13 2000)    OBJECTIVE: Physical Exam: Filed Vitals:   10/30/13 2145 10/30/13 2300 10/30/13 2343 10/31/13 0417  BP: 135/43  121/58 114/33  Pulse: 58     Temp:  97.4 F (36.3 C)  97.9 F (36.6 C)  TempSrc:  Oral  Oral  Resp:   16 16  Height:      Weight:      SpO2:   96% 97%    Intake/Output Summary (Last 24 hours) at 10/31/13 0741 Last data filed at 10/31/13 0700  Gross per 24 hour  Intake 1325.9 ml  Output   1400 ml  Net  -74.1 ml    Telemetry reveals sinus rhythm  GEN- The patient is well appearing, alert and oriented x 3 today.   Head- normocephalic, atraumatic Eyes-  Sclera clear, conjunctiva pink Ears- hearing intact Oropharynx- clear Neck- supple  Lungs- Clear to ausculation bilaterally,   Heart- RRm  no murmurs, rubs or gallops, PMI not laterally displaced GI- soft, NT, ND, + BS Extremities- no clubbing, cyanosis, or edema, groin is ok     Psych- euthymic mood, full affect Neuro- strength and sensation are intact  LABS: Basic Metabolic Panel:  Recent Labs  10/30/13 0520 10/30/13 1105  NA  134*  --   K 5.8* 4.8  CL 100  --   CO2 19  --   GLUCOSE 131*  --   BUN 29*  --   CREATININE 1.72*  --   CALCIUM 9.2  --    Liver Function Tests: No results found for this basename: AST, ALT, ALKPHOS, BILITOT, PROT, ALBUMIN,  in the last 72 hours No results found for this basename: LIPASE, AMYLASE,  in the last 72 hours CBC:  Recent Labs  10/29/13 0610 10/30/13 0520  WBC 5.4 6.4  HGB 11.1* 11.1*  HCT 32.1* 33.0*  MCV 94.4 94.6  PLT 241 252   Cardiac Enzymes:  Recent Labs  10/28/13 0905 10/28/13 1640  TROPONINI 2.59* 1.38*   BNP: No components found with this basename: POCBNP,  D-Dimer: No results found for this basename: DDIMER,  in the last 72 hours Hemoglobin A1C: No results found for this basename: HGBA1C,  in the last 72 hours Fasting Lipid Panel: No results found for this basename: CHOL, HDL, LDLCALC, TRIG, CHOLHDL, LDLDIRECT,  in the last 72 hours Thyroid Function Tests: No results found for this basename:  TSH, T4TOTAL, FREET3, T3FREE, THYROIDAB,  in the last 72 hours Anemia Panel: No results found for this basename: VITAMINB12, FOLATE, FERRITIN, TIBC, IRON, RETICCTPCT,  in the last 72 hours  RADIOLOGY: Dg Bone Density  10/10/2013   EXAM: DG DEXA AXIAL SKELETON  The Bone Mineral Densitometry hard-copy report (which includes all data, graphical display, and FRAX results when applicable) has been sent directly to the ordering physician.  This report can also be obtained electronically by viewing images for this exam through the performing facility's EMR, or by logging directly into [employer].   Electronically Signed   By: Rolm Baptise M.D.   On: 10/10/2013 09:56   Dg Chest Portable 1 View  10/27/2013   CLINICAL DATA:  Chest pain for 3 days. Nausea and shortness of breath.  EXAM: PORTABLE CHEST - 1 VIEW  COMPARISON:  Chest radiograph performed 01/17/2005  FINDINGS: The lungs are well-aerated. Pulmonary vascularity is at the upper limits of normal. Mild  bibasilar opacities likely reflect atelectasis. There is no evidence of pleural effusion or pneumothorax.  The cardiomediastinal silhouette is within normal limits. No acute osseous abnormalities are seen.  IMPRESSION: Mild bibasilar opacities likely reflect atelectasis; lungs otherwise clear.   Electronically Signed   By: Garald Balding M.D.   On: 10/27/2013 23:41   ASSESSMENT:  1. NSTEMI - stable, she has 3 V CAD.  Will ask TCTS to consult.  2. Type II diet controlled DM  3. HTN - controlled  4. Dyslipidemia  5. Breast skin lesion at 10-11:00 o'clock on right breast that is excoriated - patient states she reached down to scratch it and a bunch of fluid came out of it. She will need to follow-up with primary care after discharge. 6. CKD - stable,  Will check BMP today.    Darden Amber., MD 10/31/2013 7:41 AM

## 2013-10-31 NOTE — Progress Notes (Signed)
Pre-op Cardiac Surgery  Carotid Findings:  Bilateral:  1-39% ICA stenosis.  Vertebral artery flow is antegrade.    Landry Mellow, RDMS, RVT 10/31/2013   LIMB DOPPLERS PENDING  Upper Extremity Right Left  Brachial Pressures    Radial Waveforms    Ulnar Waveforms    Palmar Arch (Allen's Test)     Findings:      Lower  Extremity Right Left  Dorsalis Pedis    Anterior Tibial    Posterior Tibial    Ankle/Brachial Indices      Findings:

## 2013-10-31 NOTE — Progress Notes (Signed)
ANTICOAGULATION CONSULT NOTE - Follow Up Consult  Pharmacy Consult for Heparin  Indication: chest pain/ACS  Allergies  Allergen Reactions  . Meperidine Hcl Nausea And Vomiting  . Niacin Other (See Comments)    REACTION: face peeling and burning  . Propoxyphene-Acetaminophen Nausea And Vomiting   Patient Measurements: Height: 5\' 5"  (165.1 cm) Weight: 211 lb 6.7 oz (95.9 kg) IBW/kg (Calculated) : 57 Heparin Dosing Weight: 85 kg  Vital Signs: Temp: 98.3 F (36.8 C) (12/16 1100) Temp src: Oral (12/16 1100) BP: 127/56 mmHg (12/16 1100) Pulse Rate: 90 (12/16 0744)  Labs:  Recent Labs  10/28/13 1640  10/29/13 0610 10/29/13 1530 10/30/13 0520 10/31/13 0810  HGB  --   < > 11.1*  --  11.1* 12.1  HCT  --   --  32.1*  --  33.0* 35.8*  PLT  --   --  241  --  252 257  HEPARINUNFRC 0.24*  < > 0.94* 0.90* 0.48 0.48  CREATININE  --   --   --   --  1.72*  --   TROPONINI 1.38*  --   --   --   --   --   < > = values in this interval not displayed.  Estimated Creatinine Clearance: 36.4 ml/min (by C-G formula based on Cr of 1.72).   Assessment: 67 y/o F admitted 10/27/2013  for CP. Pharmacy consulted to dose heparin.  Events: no CP, TCTS consulted for possible OHS  Coag: ACS, heparin @ 1250 units/hr, H/H stable no bleeding noted  Home meds: fenofibrate, clonazepam, Lovaza, Naprosyn  Goal of Therapy:  Heparin level 0.3-0.7 units/ml Monitor platelets by anticoagulation protocol: Yes   Plan:  -Continue heparin at current rate -Daily CBC/HL -Monitor for bleeding   Thank you for allowing pharmacy to be a part of this patients care team.  Rowe Robert Pharm.D., BCPS Clinical Pharmacist 10/31/2013 1:31 PM Pager: [phone]) [phone] Phone: [phone]

## 2013-10-31 NOTE — Progress Notes (Signed)
CARDIAC REHAB PHASE I   PRE:  Rate/Rhythm: 57SB  BP:  Supine:   Sitting: 127/56  Standing:    SaO2: 98%RA  MODE:  Ambulation: 350 ft   POST:  Rate/Rhythm: 76SR  BP:  Supine:   Sitting: 148/52  Standing:    SaO2: 98%RA 1045-1120 Pt walked 350 ft on RA with hand held asst. Gait steady. No Cp. To recliner with call bell. Was going to start pre op ed but pt got phone call and tearful on phone.  Will follow up tomorrow.   Graylon Good, RN BSN  10/31/2013 11:17 AM

## 2013-11-01 ENCOUNTER — Other Ambulatory Visit: Payer: Self-pay | Admitting: *Deleted

## 2013-11-01 ENCOUNTER — Encounter (HOSPITAL_COMMUNITY): Payer: Self-pay | Admitting: Certified Registered Nurse Anesthetist

## 2013-11-01 DIAGNOSIS — I2510 Atherosclerotic heart disease of native coronary artery without angina pectoris: Secondary | ICD-10-CM

## 2013-11-01 DIAGNOSIS — R05 Cough: Secondary | ICD-10-CM

## 2013-11-01 DIAGNOSIS — R072 Precordial pain: Secondary | ICD-10-CM

## 2013-11-01 LAB — GLUCOSE, CAPILLARY
Glucose-Capillary: 164 mg/dL — ABNORMAL HIGH (ref 70–99)
Glucose-Capillary: 113 mg/dL — ABNORMAL HIGH (ref 70–99)

## 2013-11-01 LAB — CBC
MCH: 32.6 pg (ref 26.0–34.0)
MCV: 93.1 fL (ref 78.0–100.0)
Platelets: 239 10*3/uL (ref 150–400)
RBC: 3.5 MIL/uL — ABNORMAL LOW (ref 3.87–5.11)

## 2013-11-01 LAB — BASIC METABOLIC PANEL
BUN: 27 mg/dL — ABNORMAL HIGH (ref 6–23)
CO2: 20 mEq/L (ref 19–32)
Chloride: 103 mEq/L (ref 96–112)
Creatinine, Ser: 1.68 mg/dL — ABNORMAL HIGH (ref 0.50–1.10)
GFR calc Af Amer: 35 mL/min — ABNORMAL LOW (ref >90)
Glucose, Bld: 106 mg/dL — ABNORMAL HIGH (ref 70–99)

## 2013-11-01 LAB — SURGICAL PCR SCREEN
MRSA, PCR: NEGATIVE
Staphylococcus aureus: NEGATIVE

## 2013-11-01 MED ORDER — EPINEPHRINE HCL 1 MG/ML IJ SOLN
0.5000 ug/min | INTRAMUSCULAR | Status: DC
  Filled 2013-11-01: qty 4

## 2013-11-01 MED ORDER — MAGNESIUM SULFATE 50 % IJ SOLN
40.0000 meq | INTRAMUSCULAR | Status: DC
  Filled 2013-11-01: qty 10

## 2013-11-01 MED ORDER — SODIUM CHLORIDE 0.9 % IV SOLN
INTRAVENOUS | Status: DC
  Filled 2013-11-01: qty 30

## 2013-11-01 MED ORDER — DOPAMINE-DEXTROSE 3.2-5 MG/ML-% IV SOLN
2.0000 ug/kg/min | INTRAVENOUS | Status: DC
  Filled 2013-11-01: qty 250

## 2013-11-01 MED ORDER — TEMAZEPAM 15 MG PO CAPS: 15.0000 mg | ORAL_CAPSULE | Freq: Once | ORAL | Status: AC | PRN

## 2013-11-01 MED ORDER — SODIUM CHLORIDE 0.9 % IV SOLN
INTRAVENOUS | Status: DC
  Filled 2013-11-01: qty 1

## 2013-11-01 MED ORDER — POTASSIUM CHLORIDE 2 MEQ/ML IV SOLN
80.0000 meq | INTRAVENOUS | Status: DC
  Filled 2013-11-01: qty 40

## 2013-11-01 MED ORDER — DEXTROSE 5 % IV SOLN
1.5000 g | INTRAVENOUS | Status: DC
  Filled 2013-11-01: qty 1.5

## 2013-11-01 MED ORDER — PHENYLEPHRINE HCL 10 MG/ML IJ SOLN
30.0000 ug/min | INTRAMUSCULAR | Status: DC
  Filled 2013-11-01: qty 2

## 2013-11-01 MED ORDER — BISACODYL 5 MG PO TBEC
5.0000 mg | DELAYED_RELEASE_TABLET | Freq: Once | ORAL | Status: AC
  Administered 2013-11-01: 5 mg via ORAL
  Filled 2013-11-01: qty 1

## 2013-11-01 MED ORDER — METOPROLOL TARTRATE 12.5 MG HALF TABLET
12.5000 mg | ORAL_TABLET | Freq: Once | ORAL | Status: DC
  Filled 2013-11-01: qty 1

## 2013-11-01 MED ORDER — DEXTROMETHORPHAN POLISTIREX 30 MG/5ML PO LQCR
15.0000 mg | Freq: Two times a day (BID) | ORAL | Status: DC
  Administered 2013-11-01 (×2): 15 mg via ORAL
  Filled 2013-11-01 (×4): qty 5

## 2013-11-01 MED ORDER — PLASMA-LYTE 148 IV SOLN
INTRAVENOUS | Status: AC
  Administered 2013-11-02: 07:00:00
  Filled 2013-11-01: qty 2.5

## 2013-11-01 MED ORDER — VANCOMYCIN HCL 10 G IV SOLR
1250.0000 mg | INTRAVENOUS | Status: AC
  Administered 2013-11-02: 1500 mg via INTRAVENOUS
  Filled 2013-11-01 (×2): qty 1250

## 2013-11-01 MED ORDER — NITROGLYCERIN IN D5W 200-5 MCG/ML-% IV SOLN
2.0000 ug/min | INTRAVENOUS | Status: DC
  Filled 2013-11-01: qty 250

## 2013-11-01 MED ORDER — DEXMEDETOMIDINE HCL IN NACL 400 MCG/100ML IV SOLN
0.1000 ug/kg/h | INTRAVENOUS | Status: DC
  Filled 2013-11-01: qty 100

## 2013-11-01 MED ORDER — ALPRAZOLAM 0.25 MG PO TABS: 0.2500 mg | ORAL_TABLET | ORAL | Status: DC | PRN

## 2013-11-01 MED ORDER — DEXTROSE 5 % IV SOLN
1.5000 g | INTRAVENOUS | Status: AC
  Administered 2013-11-02: .75 g via INTRAVENOUS
  Administered 2013-11-02: 1.5 g via INTRAVENOUS
  Filled 2013-11-01 (×2): qty 1.5

## 2013-11-01 MED ORDER — DIAZEPAM 5 MG PO TABS
5.0000 mg | ORAL_TABLET | Freq: Once | ORAL | Status: AC
  Administered 2013-11-02: 5 mg via ORAL
  Filled 2013-11-01: qty 1

## 2013-11-01 MED ORDER — SODIUM CHLORIDE 0.9 % IV SOLN
INTRAVENOUS | Status: DC
  Filled 2013-11-01: qty 40

## 2013-11-01 MED ORDER — DEXTROSE 5 % IV SOLN
750.0000 mg | INTRAVENOUS | Status: DC
  Filled 2013-11-01 (×2): qty 750

## 2013-11-01 NOTE — Progress Notes (Signed)
CARDIAC REHAB PHASE I  PRE: Rate/Rhythm: 53 SB  BP: sitting 127/61  SaO2:  MODE: Ambulation: 350 ft  POST: Rate/Rhythm: 72 SR  BP: sitting 156/68  SaO2:   Asleep upon arrival. Slightly unsteady getting up. Some SOB but denied CP. Wanted to go back to bed after walk. Pre-op ed done. Gave IS and instructions for use. Able to inhale about 1000 ml. Gave OHS book and instructed pt to watch video. Will f/u after surg.  [id number]  Josephina Shih [place] CES, ACSM  11/01/2013  11:03 AM

## 2013-11-01 NOTE — Progress Notes (Signed)
2 Days Post-Op Procedure(s) (LRB): LEFT HEART CATHETERIZATION WITH CORONARY ANGIOGRAM (N/A) Subjective: No chest pain or shortness of breath  Objective: Vital signs in last 24 hours: Temp:  [97.7 F (36.5 C)-98.1 F (36.7 C)] 97.9 F (36.6 C) (12/17 1154) Pulse Rate:  [35-63] 58 (12/17 1154) Cardiac Rhythm:  [-] Normal sinus rhythm (12/17 0800) Resp:  [18] 18 (12/16 2320) BP: (120-149)/(56-72) 144/58 mmHg (12/17 1154) SpO2:  [95 %-97 %] 97 % (12/17 1154)  Hemodynamic parameters for last 24 hours:    Intake/Output from previous day: 12/16 0701 - 12/17 0700 In: 792.5 [P.O.:240; I.V.:552.5] Out: -  Intake/Output this shift: Total I/O In: 262.5 [P.O.:240; I.V.:22.5] Out: -   General appearance: alert and cooperative Heart: regular rate and rhythm, S1, S2 normal, no murmur, click, rub or gallop Lungs: clear to auscultation bilaterally  Lab Results:  Recent Labs  10/31/13 0810 11/01/13 0604  WBC 6.2 6.0  HGB 12.1 11.4*  HCT 35.8* 32.6*  PLT 257 239   BMET:  Recent Labs  10/30/13 0520 10/30/13 1105 11/01/13 0604  NA 134*  --  136  K 5.8* 4.8 4.8  CL 100  --  103  CO2 19  --  20  GLUCOSE 131*  --  106*  BUN 29*  --  27*  CREATININE 1.72*  --  1.68*  CALCIUM 9.2  --  9.7    PT/INR: No results found for this basename: LABPROT, INR,  in the last 72 hours ABG    Component Value Date/Time   TCO2 25 10/27/2013 2325   CBG (last 3)   Recent Labs  11/01/13 0755 11/01/13 1155 11/01/13 1636  GLUCAP 113* 114* 135*    Assessment/Plan: S/P Procedure(s) (LRB): LEFT HEART CATHETERIZATION WITH CORONARY ANGIOGRAM (N/A) Severe multi-vessel coronary artery disease. Plan CABG in am.   LOS: 5 days    Angelee Bahr K 11/01/2013

## 2013-11-01 NOTE — Progress Notes (Addendum)
SUBJECTIVE: The patient is doing well today.  At this time, she denies chest pain, shortness of breath, or any new concerns.  Cath revealed 3 V CAD - not very favorable for PCI.    TCTS has been consulted.  Has developed a dry cough   . amitriptyline  25 mg Oral QHS  . amLODipine  5 mg Oral Daily  . aspirin EC  81 mg Oral Daily  . benazepril  20 mg Oral Daily  . buPROPion  300 mg Oral Daily  . darifenacin  15 mg Oral Daily  . DULoxetine  60 mg Oral BID  . famotidine  20 mg Oral BID  . levothyroxine  50 mcg Oral QAC breakfast  . loratadine  10 mg Oral Daily  . metoprolol tartrate  12.5 mg Oral BID  . nicotine  14 mg Transdermal Q0600  . nitroGLYCERIN  5-200 mcg/min Intravenous Once  . simvastatin  20 mg Oral q1800  . sodium chloride  3 mL Intravenous Q12H   . sodium chloride 10 mL/hr at 10/29/13 2200  . heparin 1,250 Units/hr (11/01/13 0800)  . nitroGLYCERIN 2.667 mcg/min (10/29/13 2000)    OBJECTIVE: Physical Exam: Filed Vitals:   10/31/13 1700 10/31/13 2146 10/31/13 2320 11/01/13 0752  BP:  120/63 149/56 124/72  Pulse:  35  63  Temp: 97.6 F (36.4 C) 98.1 F (36.7 C) 98.1 F (36.7 C) 97.7 F (36.5 C)  TempSrc: Axillary Oral Oral Oral  Resp:   18   Height:      Weight:      SpO2:  97% 95% 97%    Intake/Output Summary (Last 24 hours) at 11/01/13 0843 Last data filed at 11/01/13 0800  Gross per 24 hour  Intake    780 ml  Output      0 ml  Net    780 ml    Telemetry reveals sinus rhythm  GEN- The patient is well appearing, alert and oriented x 3 today.   Head- normocephalic, atraumatic Eyes-  Sclera clear,  Ears- hearing intact,  Oropharynx- clear Neck- supple  Lungs- Clear to ausculation bilaterally,   Heart- RRm  no murmurs, rubs or gallops, PMI not laterally displaced GI- soft, NT, ND, + BS Extremities- no clubbing, cyanosis, or edema, groin is ok     Psych- euthymic mood, full affect Neuro- strength and sensation are intact  LABS: Basic  Metabolic Panel:  Recent Labs  10/30/13 0520 10/30/13 1105 11/01/13 0604  NA 134*  --  136  K 5.8* 4.8 4.8  CL 100  --  103  CO2 19  --  20  GLUCOSE 131*  --  106*  BUN 29*  --  27*  CREATININE 1.72*  --  1.68*  CALCIUM 9.2  --  9.7   Liver Function Tests: No results found for this basename: AST, ALT, ALKPHOS, BILITOT, PROT, ALBUMIN,  in the last 72 hours No results found for this basename: LIPASE, AMYLASE,  in the last 72 hours CBC:  Recent Labs  10/31/13 0810 11/01/13 0604  WBC 6.2 6.0  HGB 12.1 11.4*  HCT 35.8* 32.6*  MCV 94.7 93.1  PLT 257 239   Cardiac Enzymes: No results found for this basename: CKTOTAL, CKMB, CKMBINDEX, TROPONINI,  in the last 72 hours BNP: No components found with this basename: POCBNP,  D-Dimer: No results found for this basename: DDIMER,  in the last 72 hours Hemoglobin A1C: No results found for this basename: HGBA1C,  in the last  72 hours Fasting Lipid Panel: No results found for this basename: CHOL, HDL, LDLCALC, TRIG, CHOLHDL, LDLDIRECT,  in the last 72 hours Thyroid Function Tests: No results found for this basename: TSH, T4TOTAL, FREET3, T3FREE, THYROIDAB,  in the last 72 hours Anemia Panel: No results found for this basename: VITAMINB12, FOLATE, FERRITIN, TIBC, IRON, RETICCTPCT,  in the last 72 hours  RADIOLOGY: Dg Bone Density  10/10/2013   EXAM: DG DEXA AXIAL SKELETON  The Bone Mineral Densitometry hard-copy report (which includes all data, graphical display, and FRAX results when applicable) has been sent directly to the ordering physician.  This report can also be obtained electronically by viewing images for this exam through the performing facility's EMR, or by logging directly into [employer].   Electronically Signed   By: Rolm Baptise M.D.   On: 10/10/2013 09:56   Dg Chest Portable 1 View  10/27/2013   CLINICAL DATA:  Chest pain for 3 days. Nausea and shortness of breath.  EXAM: PORTABLE CHEST - 1 VIEW  COMPARISON:  Chest  radiograph performed 01/17/2005  FINDINGS: The lungs are well-aerated. Pulmonary vascularity is at the upper limits of normal. Mild bibasilar opacities likely reflect atelectasis. There is no evidence of pleural effusion or pneumothorax.  The cardiomediastinal silhouette is within normal limits. No acute osseous abnormalities are seen.  IMPRESSION: Mild bibasilar opacities likely reflect atelectasis; lungs otherwise clear.   Electronically Signed   By: Garald Balding M.D.   On: 10/27/2013 23:41   ASSESSMENT:  1. NSTEMI - stable, she has 3 V CAD.  Will ask TCTS to consult.  2. Type II diet controlled DM  3. HTN - controlled  4. Dyslipidemia  5. Breast skin lesion at 10-11:00 o'clock on right breast that is excoriated - patient states she reached down to scratch it and a bunch of fluid came out of it. She will need to follow-up with primary care after discharge. 6. CKD - stable,  Creatinine has improved.  7. Cough- will try Delsym.Marland Kitchen    Darden Amber., MD 11/01/2013 8:43 AM

## 2013-11-01 NOTE — Progress Notes (Signed)
  Echocardiogram 2D Echocardiogram has been performed.  [place], [address] 11/01/2013, 3:07 PM

## 2013-11-01 NOTE — Progress Notes (Signed)
ANTICOAGULATION CONSULT NOTE - Follow Up Consult  Pharmacy Consult for Heparin  Indication: chest pain/ACS  Allergies  Allergen Reactions  . Meperidine Hcl Nausea And Vomiting  . Niacin Other (See Comments)    REACTION: face peeling and burning  . Propoxyphene-Acetaminophen Nausea And Vomiting   Patient Measurements: Height: 5\' 5"  (165.1 cm) Weight: 211 lb 6.7 oz (95.9 kg) IBW/kg (Calculated) : 57 Heparin Dosing Weight: 85 kg  Vital Signs: Temp: 97.7 F (36.5 C) (12/17 0752) Temp src: Oral (12/17 0752) BP: 124/72 mmHg (12/17 0752) Pulse Rate: 63 (12/17 0752)  Labs:  Recent Labs  10/30/13 0520 10/31/13 0810 11/01/13 0604  HGB 11.1* 12.1 11.4*  HCT 33.0* 35.8* 32.6*  PLT 252 257 239  HEPARINUNFRC 0.48 0.48 0.51  CREATININE 1.72*  --  1.68*    Estimated Creatinine Clearance: 37.2 ml/min (by C-G formula based on Cr of 1.68).   Assessment: 67 y/o F on heparin for CP and heparin level was 0.51 this morning.  Now she is s/p cath lab and noted with severe 3V CAD and for CABG on 11/02/13.  Goal of Therapy:  Heparin level 0.3-0.7 units/ml Monitor platelets by anticoagulation protocol: Yes   Plan:  -No heparin changes needed -Daily heparin level and CBC  Hildred Laser, Pharm D 11/01/2013 9:39 AM

## 2013-11-01 NOTE — Progress Notes (Signed)
Pre-op Cardiac Surgery  Carotid Findings:  Bilateral:  1-39% ICA stenosis.  Vertebral artery flow is antegrade.    Landry Mellow, RDMS, RVT 11/01/2013   LIMB DOPPLERS PENDING  Upper Extremity Right Left  Brachial Pressures 127 triphasic 129 triphasic  Radial Waveforms triphasic triphasic  Ulnar Waveforms triphasic triphasic  Palmar Arch (Allen's Test) WNL WNL   Findings:  Doppler waveforms remain normal with ulnar and radial compressions bilaterally.    Lower  Extremity Right Left  Dorsalis Pedis    Anterior Tibial 152 biphasic 142 biphasic  Posterior Tibial 148 biphasic 163 biphasic  Ankle/Brachial Indices >1.0 >1.0    Findings:  ABI is within normal limits with abnormal Doppler waveforms bilaterally.

## 2013-11-02 ENCOUNTER — Encounter (HOSPITAL_COMMUNITY): Payer: Medicare Other | Admitting: Certified Registered Nurse Anesthetist

## 2013-11-02 ENCOUNTER — Encounter (HOSPITAL_COMMUNITY)
Admission: EM | Disposition: A | Discharge status: Home / Self Care | Payer: Medicare Other | Source: Home / Self Care | Attending: Cardiovascular Disease

## 2013-11-02 ENCOUNTER — Inpatient Hospital Stay (HOSPITAL_COMMUNITY): Payer: Medicare Other | Admitting: Certified Registered Nurse Anesthetist

## 2013-11-02 ENCOUNTER — Inpatient Hospital Stay (HOSPITAL_COMMUNITY): Payer: Medicare Other

## 2013-11-02 ENCOUNTER — Encounter (HOSPITAL_COMMUNITY): Payer: Self-pay | Admitting: Certified Registered Nurse Anesthetist

## 2013-11-02 DIAGNOSIS — I2510 Atherosclerotic heart disease of native coronary artery without angina pectoris: Secondary | ICD-10-CM

## 2013-11-02 HISTORY — PX: INTRAOPERATIVE TRANSESOPHAGEAL ECHOCARDIOGRAM: SHX5062

## 2013-11-02 HISTORY — PX: CORONARY ARTERY BYPASS GRAFT: SHX141

## 2013-11-02 LAB — POCT I-STAT 3, ART BLOOD GAS (G3+)
Acid-base deficit: 2 mmol/L (ref 0.0–2.0)
Acid-base deficit: 3 mmol/L — ABNORMAL HIGH (ref 0.0–2.0)
Acid-base deficit: 5 mmol/L — ABNORMAL HIGH (ref 0.0–2.0)
Bicarbonate: 20.9 mEq/L (ref 20.0–24.0)
Bicarbonate: 20.4 mEq/L (ref 20.0–24.0)
Bicarbonate: 24.2 mEq/L — ABNORMAL HIGH (ref 20.0–24.0)
O2 Saturation: 100 %
O2 Saturation: 100 %
O2 Saturation: 96 %
Patient temperature: 35.7
pCO2 arterial: 34.9 mmHg — ABNORMAL LOW (ref 35.0–45.0)
pCO2 arterial: 48.6 mmHg — ABNORMAL HIGH (ref 35.0–45.0)
pH, Arterial: 7.37 (ref 7.350–7.450)
pH, Arterial: 7.302 — ABNORMAL LOW (ref 7.350–7.450)
pO2, Arterial: 359 mmHg — ABNORMAL HIGH (ref 80.0–100.0)
pO2, Arterial: 226 mmHg — ABNORMAL HIGH (ref 80.0–100.0)
pO2, Arterial: 75 mmHg — ABNORMAL LOW (ref 80.0–100.0)
pO2, Arterial: 78 mmHg — ABNORMAL LOW (ref 80.0–100.0)

## 2013-11-02 LAB — POCT I-STAT 4, (NA,K, GLUC, HGB,HCT)
Glucose, Bld: 114 mg/dL — ABNORMAL HIGH (ref 70–99)
Glucose, Bld: 124 mg/dL — ABNORMAL HIGH (ref 70–99)
Glucose, Bld: 130 mg/dL — ABNORMAL HIGH (ref 70–99)
Glucose, Bld: 127 mg/dL — ABNORMAL HIGH (ref 70–99)
Glucose, Bld: 133 mg/dL — ABNORMAL HIGH (ref 70–99)
HCT: 33 % — ABNORMAL LOW (ref 36.0–46.0)
HCT: 23 % — ABNORMAL LOW (ref 36.0–46.0)
HCT: 26 % — ABNORMAL LOW (ref 36.0–46.0)
HCT: 30 % — ABNORMAL LOW (ref 36.0–46.0)
Hemoglobin: 11.2 g/dL — ABNORMAL LOW (ref 12.0–15.0)
Hemoglobin: 9.9 g/dL — ABNORMAL LOW (ref 12.0–15.0)
Hemoglobin: 8.8 g/dL — ABNORMAL LOW (ref 12.0–15.0)
Hemoglobin: 10.2 g/dL — ABNORMAL LOW (ref 12.0–15.0)
Potassium: 4.4 mEq/L (ref 3.5–5.1)
Potassium: 4.2 mEq/L (ref 3.5–5.1)
Potassium: 5.2 mEq/L — ABNORMAL HIGH (ref 3.5–5.1)
Potassium: 6.1 mEq/L — ABNORMAL HIGH (ref 3.5–5.1)
Potassium: 4.4 mEq/L (ref 3.5–5.1)
Sodium: 132 mEq/L — ABNORMAL LOW (ref 135–145)
Sodium: 137 mEq/L (ref 135–145)
Sodium: 137 mEq/L (ref 135–145)

## 2013-11-02 LAB — BASIC METABOLIC PANEL
BUN: 28 mg/dL — ABNORMAL HIGH (ref 6–23)
Chloride: 103 mEq/L (ref 96–112)
Creatinine, Ser: 1.58 mg/dL — ABNORMAL HIGH (ref 0.50–1.10)
GFR calc Af Amer: 38 mL/min — ABNORMAL LOW (ref >90)
GFR calc non Af Amer: 33 mL/min — ABNORMAL LOW (ref >90)

## 2013-11-02 LAB — CBC
HCT: 29.3 % — ABNORMAL LOW (ref 36.0–46.0)
HCT: 30.9 % — ABNORMAL LOW (ref 36.0–46.0)
Hemoglobin: 10.9 g/dL — ABNORMAL LOW (ref 12.0–15.0)
MCH: 32.5 pg (ref 26.0–34.0)
MCH: 32 pg (ref 26.0–34.0)
MCH: 31.9 pg (ref 26.0–34.0)
MCHC: 34.7 g/dL (ref 30.0–36.0)
MCHC: 34.8 g/dL (ref 30.0–36.0)
MCHC: 35.3 g/dL (ref 30.0–36.0)
MCV: 93.5 fL (ref 78.0–100.0)
MCV: 91.8 fL (ref 78.0–100.0)
Platelets: 240 10*3/uL (ref 150–400)
Platelets: 158 10*3/uL (ref 150–400)
RBC: 3.54 MIL/uL — ABNORMAL LOW (ref 3.87–5.11)
RBC: 3.19 MIL/uL — ABNORMAL LOW (ref 3.87–5.11)
RBC: 3.42 MIL/uL — ABNORMAL LOW (ref 3.87–5.11)
RDW: 13 % (ref 11.5–15.5)
RDW: 13.7 % (ref 11.5–15.5)
WBC: 8 10*3/uL (ref 4.0–10.5)

## 2013-11-02 LAB — PREPARE RBC (CROSSMATCH)

## 2013-11-02 LAB — POCT I-STAT, CHEM 8
Chloride: 102 mEq/L (ref 96–112)
Creatinine, Ser: 1.3 mg/dL — ABNORMAL HIGH (ref 0.50–1.10)
HCT: 32 % — ABNORMAL LOW (ref 36.0–46.0)
Hemoglobin: 10.9 g/dL — ABNORMAL LOW (ref 12.0–15.0)
Potassium: 4.6 mEq/L (ref 3.5–5.1)
Sodium: 137 mEq/L (ref 135–145)

## 2013-11-02 LAB — GLUCOSE, CAPILLARY
Glucose-Capillary: 138 mg/dL — ABNORMAL HIGH (ref 70–99)
Glucose-Capillary: 132 mg/dL — ABNORMAL HIGH (ref 70–99)
Glucose-Capillary: 128 mg/dL — ABNORMAL HIGH (ref 70–99)
Glucose-Capillary: 127 mg/dL — ABNORMAL HIGH (ref 70–99)
Glucose-Capillary: 135 mg/dL — ABNORMAL HIGH (ref 70–99)

## 2013-11-02 LAB — ABO/RH: ABO/RH(D): O NEG

## 2013-11-02 LAB — CREATININE, SERUM
Creatinine, Ser: 1.26 mg/dL — ABNORMAL HIGH (ref 0.50–1.10)
GFR calc non Af Amer: 43 mL/min — ABNORMAL LOW (ref >90)

## 2013-11-02 LAB — MAGNESIUM: Magnesium: 2.5 mg/dL (ref 1.5–2.5)

## 2013-11-02 LAB — PROTIME-INR: Prothrombin Time: 15.3 seconds — ABNORMAL HIGH (ref 11.6–15.2)

## 2013-11-02 SURGERY — CORONARY ARTERY BYPASS GRAFTING (CABG)
Anesthesia: General | Site: Chest

## 2013-11-02 MED ORDER — VANCOMYCIN HCL IN DEXTROSE 1-5 GM/200ML-% IV SOLN
1000.0000 mg | Freq: Once | INTRAVENOUS | Status: AC
  Administered 2013-11-02: 1000 mg via INTRAVENOUS
  Filled 2013-11-02: qty 200

## 2013-11-02 MED ORDER — FAMOTIDINE IN NACL 20-0.9 MG/50ML-% IV SOLN
20.0000 mg | Freq: Two times a day (BID) | INTRAVENOUS | Status: AC
  Administered 2013-11-02: 20 mg via INTRAVENOUS

## 2013-11-02 MED ORDER — SODIUM CHLORIDE 0.9 % IJ SOLN
3.0000 mL | Freq: Two times a day (BID) | INTRAMUSCULAR | Status: DC
  Administered 2013-11-03 – 2013-11-04 (×3): 3 mL via INTRAVENOUS

## 2013-11-02 MED ORDER — THROMBIN 20000 UNITS EX SOLR
OROMUCOSAL | Status: DC | PRN
  Administered 2013-11-02 (×3): via TOPICAL

## 2013-11-02 MED ORDER — BISACODYL 5 MG PO TBEC
10.0000 mg | DELAYED_RELEASE_TABLET | Freq: Every day | ORAL | Status: DC
  Administered 2013-11-03 – 2013-11-04 (×2): 10 mg via ORAL
  Filled 2013-11-02 (×2): qty 2

## 2013-11-02 MED ORDER — LEVOTHYROXINE SODIUM 50 MCG PO TABS
50.0000 ug | ORAL_TABLET | Freq: Every day | ORAL | Status: DC
  Administered 2013-11-03 – 2013-11-06 (×4): 50 ug via ORAL
  Filled 2013-11-02 (×6): qty 1

## 2013-11-02 MED ORDER — MORPHINE SULFATE 2 MG/ML IJ SOLN
1.0000 mg | INTRAMUSCULAR | Status: AC | PRN
  Administered 2013-11-02: 4 mg via INTRAVENOUS
  Filled 2013-11-02: qty 2

## 2013-11-02 MED ORDER — METOPROLOL TARTRATE 1 MG/ML IV SOLN: 2.5000 mg | INTRAVENOUS | Status: DC | PRN

## 2013-11-02 MED ORDER — SODIUM CHLORIDE 0.9 % IV SOLN
100.0000 [IU] | INTRAVENOUS | Status: DC | PRN
  Administered 2013-11-02: 1 [IU]/h via INTRAVENOUS

## 2013-11-02 MED ORDER — 0.9 % SODIUM CHLORIDE (POUR BTL) OPTIME
TOPICAL | Status: DC | PRN
  Administered 2013-11-02: 6000 mL

## 2013-11-02 MED ORDER — DEXTROSE 5 % IV SOLN
1.5000 g | Freq: Two times a day (BID) | INTRAVENOUS | Status: AC
  Administered 2013-11-02 – 2013-11-04 (×4): 1.5 g via INTRAVENOUS
  Filled 2013-11-02 (×5): qty 1.5

## 2013-11-02 MED ORDER — SODIUM CHLORIDE 0.9 % IV SOLN
200.0000 ug | INTRAVENOUS | Status: DC | PRN
  Administered 2013-11-02: .3 ug/kg/h via INTRAVENOUS

## 2013-11-02 MED ORDER — POTASSIUM CHLORIDE 10 MEQ/50ML IV SOLN: 10.0000 meq | INTRAVENOUS | Status: AC

## 2013-11-02 MED ORDER — ACETAMINOPHEN 160 MG/5ML PO SOLN
1000.0000 mg | Freq: Four times a day (QID) | ORAL | Status: DC
  Filled 2013-11-02: qty 40

## 2013-11-02 MED ORDER — SIMVASTATIN 20 MG PO TABS
20.0000 mg | ORAL_TABLET | Freq: Every day | ORAL | Status: DC
  Administered 2013-11-03 – 2013-11-05 (×3): 20 mg via ORAL
  Filled 2013-11-02 (×4): qty 1

## 2013-11-02 MED ORDER — LACTATED RINGERS IV SOLN
INTRAVENOUS | Status: DC | PRN
  Administered 2013-11-02: 07:00:00 via INTRAVENOUS

## 2013-11-02 MED ORDER — METOPROLOL TARTRATE 12.5 MG HALF TABLET
12.5000 mg | ORAL_TABLET | Freq: Two times a day (BID) | ORAL | Status: DC
  Administered 2013-11-03 – 2013-11-04 (×2): 12.5 mg via ORAL
  Filled 2013-11-02 (×5): qty 1

## 2013-11-02 MED ORDER — NITROGLYCERIN IN D5W 200-5 MCG/ML-% IV SOLN
INTRAVENOUS | Status: DC | PRN
  Administered 2013-11-02: 5 ug/min via INTRAVENOUS

## 2013-11-02 MED ORDER — METOPROLOL TARTRATE 25 MG/10 ML ORAL SUSPENSION
12.5000 mg | Freq: Two times a day (BID) | ORAL | Status: DC
  Filled 2013-11-02 (×5): qty 5

## 2013-11-02 MED ORDER — PHENYLEPHRINE HCL 10 MG/ML IJ SOLN
10.0000 mg | INTRAVENOUS | Status: DC | PRN
  Administered 2013-11-02: 15 ug/min via INTRAVENOUS

## 2013-11-02 MED ORDER — ALBUMIN HUMAN 5 % IV SOLN
INTRAVENOUS | Status: DC | PRN
  Administered 2013-11-02: 12:00:00 via INTRAVENOUS

## 2013-11-02 MED ORDER — THROMBIN 20000 UNITS EX KIT
PACK | CUTANEOUS | Status: DC | PRN
  Administered 2013-11-02: 20000 [IU] via TOPICAL

## 2013-11-02 MED ORDER — BISACODYL 10 MG RE SUPP: 10.0000 mg | Freq: Every day | RECTAL | Status: DC

## 2013-11-02 MED ORDER — LACTATED RINGERS IV SOLN
INTRAVENOUS | Status: DC | PRN
  Administered 2013-11-02 (×2): via INTRAVENOUS

## 2013-11-02 MED ORDER — HEPARIN SODIUM (PORCINE) 1000 UNIT/ML IJ SOLN
INTRAMUSCULAR | Status: DC | PRN
  Administered 2013-11-02: 40 mL via INTRAVENOUS

## 2013-11-02 MED ORDER — MAGNESIUM SULFATE 40 MG/ML IJ SOLN
4.0000 g | Freq: Once | INTRAMUSCULAR | Status: AC
  Administered 2013-11-02: 4 g via INTRAVENOUS
  Filled 2013-11-02: qty 100

## 2013-11-02 MED ORDER — DOCUSATE SODIUM 100 MG PO CAPS
200.0000 mg | ORAL_CAPSULE | Freq: Every day | ORAL | Status: DC
  Administered 2013-11-03 – 2013-11-04 (×2): 200 mg via ORAL
  Filled 2013-11-02 (×2): qty 2

## 2013-11-02 MED ORDER — SODIUM CHLORIDE 0.45 % IV SOLN
INTRAVENOUS | Status: DC
  Administered 2013-11-02: 14:00:00 via INTRAVENOUS

## 2013-11-02 MED ORDER — LACTATED RINGERS IV SOLN: 500.0000 mL | Freq: Once | INTRAVENOUS | Status: AC | PRN

## 2013-11-02 MED ORDER — INSULIN REGULAR HUMAN 100 UNIT/ML IJ SOLN
INTRAMUSCULAR | Status: DC
  Administered 2013-11-02: 2.1 [IU]/h via INTRAVENOUS
  Administered 2013-11-03: 02:00:00 via INTRAVENOUS
  Filled 2013-11-02 (×2): qty 1

## 2013-11-02 MED ORDER — ACETAMINOPHEN 500 MG PO TABS
1000.0000 mg | ORAL_TABLET | Freq: Four times a day (QID) | ORAL | Status: DC
  Administered 2013-11-03 – 2013-11-04 (×5): 1000 mg via ORAL
  Filled 2013-11-02 (×9): qty 2

## 2013-11-02 MED ORDER — NITROGLYCERIN IN D5W 200-5 MCG/ML-% IV SOLN: 0.0000 ug/min | INTRAVENOUS | Status: DC

## 2013-11-02 MED ORDER — MIDAZOLAM HCL 2 MG/2ML IJ SOLN: 2.0000 mg | INTRAMUSCULAR | Status: DC | PRN

## 2013-11-02 MED ORDER — PHENYLEPHRINE HCL 10 MG/ML IJ SOLN
0.0000 ug/min | INTRAVENOUS | Status: DC
  Administered 2013-11-02: 5 ug/min via INTRAVENOUS
  Filled 2013-11-02: qty 2

## 2013-11-02 MED ORDER — DEXMEDETOMIDINE HCL IN NACL 200 MCG/50ML IV SOLN
0.1000 ug/kg/h | INTRAVENOUS | Status: DC
  Filled 2013-11-02: qty 50

## 2013-11-02 MED ORDER — SODIUM CHLORIDE 0.9 % IV SOLN: 250.0000 mL | INTRAVENOUS | Status: DC

## 2013-11-02 MED ORDER — SODIUM CHLORIDE 0.9 % IV SOLN
INTRAVENOUS | Status: DC
  Administered 2013-11-02: 14:00:00 via INTRAVENOUS

## 2013-11-02 MED ORDER — INSULIN REGULAR BOLUS VIA INFUSION
0.0000 [IU] | Freq: Three times a day (TID) | INTRAVENOUS | Status: DC
  Filled 2013-11-02: qty 10

## 2013-11-02 MED ORDER — PROTAMINE SULFATE 10 MG/ML IV SOLN
INTRAVENOUS | Status: DC | PRN
  Administered 2013-11-02: 350 mg via INTRAVENOUS

## 2013-11-02 MED ORDER — LACTATED RINGERS IV SOLN
INTRAVENOUS | Status: DC | PRN
  Administered 2013-11-02 (×2): via INTRAVENOUS

## 2013-11-02 MED ORDER — OXYCODONE HCL 5 MG PO TABS
5.0000 mg | ORAL_TABLET | ORAL | Status: DC | PRN
  Administered 2013-11-03: 5 mg via ORAL
  Administered 2013-11-03 – 2013-11-04 (×5): 10 mg via ORAL
  Filled 2013-11-02: qty 1
  Filled 2013-11-02 (×4): qty 2

## 2013-11-02 MED ORDER — ALBUMIN HUMAN 5 % IV SOLN
250.0000 mL | INTRAVENOUS | Status: AC | PRN
  Administered 2013-11-02: 250 mL via INTRAVENOUS

## 2013-11-02 MED ORDER — ACETAMINOPHEN 160 MG/5ML PO SOLN: 650.0000 mg | Freq: Once | ORAL | Status: AC

## 2013-11-02 MED ORDER — MORPHINE SULFATE 2 MG/ML IJ SOLN
2.0000 mg | INTRAMUSCULAR | Status: DC | PRN
  Administered 2013-11-03: 2 mg via INTRAVENOUS
  Administered 2013-11-03 (×4): 1 mg via INTRAVENOUS
  Administered 2013-11-04: 2 mg via INTRAVENOUS
  Filled 2013-11-02 (×4): qty 1

## 2013-11-02 MED ORDER — ASPIRIN 81 MG PO CHEW: 324.0000 mg | CHEWABLE_TABLET | Freq: Every day | ORAL | Status: DC

## 2013-11-02 MED ORDER — ACETAMINOPHEN 650 MG RE SUPP
650.0000 mg | Freq: Once | RECTAL | Status: AC
  Administered 2013-11-02: 650 mg via RECTAL

## 2013-11-02 MED ORDER — ASPIRIN EC 325 MG PO TBEC
325.0000 mg | DELAYED_RELEASE_TABLET | Freq: Every day | ORAL | Status: DC
  Administered 2013-11-03 – 2013-11-04 (×2): 325 mg via ORAL
  Filled 2013-11-02 (×2): qty 1

## 2013-11-02 MED ORDER — LACTATED RINGERS IV SOLN
INTRAVENOUS | Status: DC
  Administered 2013-11-02: 20 mL/h via INTRAVENOUS

## 2013-11-02 MED ORDER — PANTOPRAZOLE SODIUM 40 MG PO TBEC
40.0000 mg | DELAYED_RELEASE_TABLET | Freq: Every day | ORAL | Status: DC
  Administered 2013-11-04: 40 mg via ORAL
  Filled 2013-11-02 (×2): qty 1

## 2013-11-02 MED ORDER — SODIUM CHLORIDE 0.9 % IJ SOLN: 3.0000 mL | INTRAMUSCULAR | Status: DC | PRN

## 2013-11-02 MED ORDER — HEMOSTATIC AGENTS (NO CHARGE) OPTIME
TOPICAL | Status: DC | PRN
  Administered 2013-11-02: 1 via TOPICAL

## 2013-11-02 MED ORDER — MIDAZOLAM HCL 5 MG/5ML IJ SOLN
INTRAMUSCULAR | Status: DC | PRN
  Administered 2013-11-02: 6 mg via INTRAVENOUS
  Administered 2013-11-02: 1 mg via INTRAVENOUS
  Administered 2013-11-02: 3 mg via INTRAVENOUS

## 2013-11-02 MED ORDER — FENTANYL CITRATE 0.05 MG/ML IJ SOLN
INTRAMUSCULAR | Status: DC | PRN
  Administered 2013-11-02 (×2): 100 ug via INTRAVENOUS
  Administered 2013-11-02: 50 ug via INTRAVENOUS
  Administered 2013-11-02: 100 ug via INTRAVENOUS
  Administered 2013-11-02 (×2): 50 ug via INTRAVENOUS
  Administered 2013-11-02 (×2): 100 ug via INTRAVENOUS
  Administered 2013-11-02: 200 ug via INTRAVENOUS
  Administered 2013-11-02 (×2): 100 ug via INTRAVENOUS
  Administered 2013-11-02: 50 ug via INTRAVENOUS
  Administered 2013-11-02: 150 ug via INTRAVENOUS
  Administered 2013-11-02: 100 ug via INTRAVENOUS

## 2013-11-02 MED ORDER — ONDANSETRON HCL 4 MG/2ML IJ SOLN
4.0000 mg | Freq: Four times a day (QID) | INTRAMUSCULAR | Status: DC | PRN
  Administered 2013-11-03 (×2): 4 mg via INTRAVENOUS
  Filled 2013-11-02 (×2): qty 2

## 2013-11-02 MED ORDER — THROMBIN 20000 UNITS EX SOLR
CUTANEOUS | Status: AC
  Filled 2013-11-02: qty 20000

## 2013-11-02 MED ORDER — VECURONIUM BROMIDE 10 MG IV SOLR
INTRAVENOUS | Status: DC | PRN
Start: 2013-11-02 — End: 2013-11-02
  Administered 2013-11-02: 5 mg via INTRAVENOUS
  Administered 2013-11-02: 10 mg via INTRAVENOUS
  Administered 2013-11-02: 5 mg via INTRAVENOUS

## 2013-11-02 MED ORDER — BUPROPION HCL ER (XL) 300 MG PO TB24
300.0000 mg | ORAL_TABLET | Freq: Every day | ORAL | Status: DC
  Administered 2013-11-03 – 2013-11-06 (×4): 300 mg via ORAL
  Filled 2013-11-02 (×4): qty 1

## 2013-11-02 MED ORDER — SODIUM CHLORIDE 0.9 % IV SOLN
10.0000 g | INTRAVENOUS | Status: DC | PRN
  Administered 2013-11-02: 5 g/h via INTRAVENOUS

## 2013-11-02 SURGICAL SUPPLY — 100 items
ATTRACTOMAT 16X20 MAGNETIC DRP (DRAPES) ×3 IMPLANT
BAG DECANTER FOR FLEXI CONT (MISCELLANEOUS) ×3 IMPLANT
BANDAGE ELASTIC 4 VELCRO ST LF (GAUZE/BANDAGES/DRESSINGS) ×3 IMPLANT
BANDAGE ELASTIC 6 VELCRO ST LF (GAUZE/BANDAGES/DRESSINGS) ×3 IMPLANT
BANDAGE GAUZE ELAST BULKY 4 IN (GAUZE/BANDAGES/DRESSINGS) ×3 IMPLANT
BASKET HEART (ORDER IN 25'S) (MISCELLANEOUS) ×1
BASKET HEART (ORDER IN 25S) (MISCELLANEOUS) ×2 IMPLANT
BLADE STERNUM SYSTEM 6 (BLADE) ×3 IMPLANT
BNDG GAUZE ELAST 4 BULKY (GAUZE/BANDAGES/DRESSINGS) ×1 IMPLANT
CANISTER SUCTION 2500CC (MISCELLANEOUS) ×3 IMPLANT
CANNULA VENOUS LOW PROF 34X46 (CANNULA) ×3 IMPLANT
CATH ROBINSON RED A/P 18FR (CATHETERS) ×6 IMPLANT
CATH THORACIC 28FR (CATHETERS) ×3 IMPLANT
CATH THORACIC 28FR RT ANG (CATHETERS) IMPLANT
CATH THORACIC 36FR (CATHETERS) ×3 IMPLANT
CATH THORACIC 36FR RT ANG (CATHETERS) ×3 IMPLANT
CLIP TI MEDIUM 24 (CLIP) IMPLANT
CLIP TI WIDE RED SMALL 24 (CLIP) ×1 IMPLANT
COVER SURGICAL LIGHT HANDLE (MISCELLANEOUS) ×3 IMPLANT
CRADLE DONUT ADULT HEAD (MISCELLANEOUS) ×3 IMPLANT
DRAPE CARDIOVASCULAR INCISE (DRAPES) ×3
DRAPE SLUSH/WARMER DISC (DRAPES) ×1 IMPLANT
DRAPE SRG 135X102X78XABS (DRAPES) ×2 IMPLANT
DRSG COVADERM 4X14 (GAUZE/BANDAGES/DRESSINGS) ×3 IMPLANT
ELECT CAUTERY BLADE 6.4 (BLADE) ×3 IMPLANT
ELECT REM PT RETURN 9FT ADLT (ELECTROSURGICAL) ×6
ELECTRODE REM PT RTRN 9FT ADLT (ELECTROSURGICAL) ×4 IMPLANT
GLOVE BIO SURGEON STRL SZ 6 (GLOVE) IMPLANT
GLOVE BIO SURGEON STRL SZ 6.5 (GLOVE) ×2 IMPLANT
GLOVE BIO SURGEON STRL SZ7 (GLOVE) IMPLANT
GLOVE BIO SURGEON STRL SZ7.5 (GLOVE) IMPLANT
GLOVE BIOGEL PI IND STRL 6 (GLOVE) IMPLANT
GLOVE BIOGEL PI IND STRL 6.5 (GLOVE) IMPLANT
GLOVE BIOGEL PI IND STRL 7.0 (GLOVE) IMPLANT
GLOVE BIOGEL PI INDICATOR 6 (GLOVE) ×2
GLOVE BIOGEL PI INDICATOR 6.5 (GLOVE) ×2
GLOVE BIOGEL PI INDICATOR 7.0 (GLOVE)
GLOVE EUDERMIC 7 POWDERFREE (GLOVE) ×6 IMPLANT
GLOVE ORTHO TXT STRL SZ7.5 (GLOVE) ×2 IMPLANT
GOWN PREVENTION PLUS XLARGE (GOWN DISPOSABLE) ×4 IMPLANT
GOWN STRL NON-REIN LRG LVL3 (GOWN DISPOSABLE) ×14 IMPLANT
HEMOSTAT POWDER SURGIFOAM 1G (HEMOSTASIS) ×9 IMPLANT
HEMOSTAT SURGICEL 2X14 (HEMOSTASIS) ×3 IMPLANT
INSERT FOGARTY 61MM (MISCELLANEOUS) ×1 IMPLANT
INSERT FOGARTY XLG (MISCELLANEOUS) IMPLANT
KIT BASIN OR (CUSTOM PROCEDURE TRAY) ×3 IMPLANT
KIT CATH CPB BARTLE (MISCELLANEOUS) ×3 IMPLANT
KIT DRAINAGE VACCUM ASSIST (KITS) ×1 IMPLANT
KIT ROOM TURNOVER OR (KITS) ×3 IMPLANT
KIT SUCTION CATH 14FR (SUCTIONS) ×3 IMPLANT
KIT VASOVIEW W/TROCAR VH 2000 (KITS) ×3 IMPLANT
NS IRRIG 1000ML POUR BTL (IV SOLUTION) ×16 IMPLANT
PACK OPEN HEART (CUSTOM PROCEDURE TRAY) ×3 IMPLANT
PAD ARMBOARD 7.5X6 YLW CONV (MISCELLANEOUS) ×6 IMPLANT
PAD ELECT DEFIB RADIOL ZOLL (MISCELLANEOUS) ×3 IMPLANT
PENCIL BUTTON HOLSTER BLD 10FT (ELECTRODE) ×3 IMPLANT
PUNCH AORTIC ROTATE 4.0MM (MISCELLANEOUS) IMPLANT
PUNCH AORTIC ROTATE 4.5MM 8IN (MISCELLANEOUS) ×3 IMPLANT
PUNCH AORTIC ROTATE 5MM 8IN (MISCELLANEOUS) IMPLANT
SET CARDIOPLEGIA MPS 5001102 (MISCELLANEOUS) ×1 IMPLANT
SPONGE GAUZE 4X4 12PLY (GAUZE/BANDAGES/DRESSINGS) ×5 IMPLANT
SPONGE GAUZE 4X4 12PLY STER LF (GAUZE/BANDAGES/DRESSINGS) ×1 IMPLANT
SPONGE INTESTINAL PEANUT (DISPOSABLE) IMPLANT
SPONGE LAP 18X18 X RAY DECT (DISPOSABLE) IMPLANT
SPONGE LAP 4X18 X RAY DECT (DISPOSABLE) ×4 IMPLANT
SUT BONE WAX W31G (SUTURE) ×3 IMPLANT
SUT MNCRL AB 4-0 PS2 18 (SUTURE) IMPLANT
SUT PROLENE 3 0 SH DA (SUTURE) IMPLANT
SUT PROLENE 3 0 SH1 36 (SUTURE) ×3 IMPLANT
SUT PROLENE 4 0 RB 1 (SUTURE)
SUT PROLENE 4 0 SH DA (SUTURE) IMPLANT
SUT PROLENE 4-0 RB1 .5 CRCL 36 (SUTURE) IMPLANT
SUT PROLENE 5 0 C 1 36 (SUTURE) IMPLANT
SUT PROLENE 6 0 C 1 30 (SUTURE) IMPLANT
SUT PROLENE 7 0 BV 1 (SUTURE) IMPLANT
SUT PROLENE 7 0 BV1 MDA (SUTURE) ×4 IMPLANT
SUT PROLENE 8 0 BV175 6 (SUTURE) IMPLANT
SUT SILK  1 MH (SUTURE)
SUT SILK 1 MH (SUTURE) IMPLANT
SUT STEEL STERNAL CCS#1 18IN (SUTURE) IMPLANT
SUT STEEL SZ 6 DBL 3X14 BALL (SUTURE) ×3 IMPLANT
SUT VIC AB 1 CTX 36 (SUTURE) ×6
SUT VIC AB 1 CTX36XBRD ANBCTR (SUTURE) ×4 IMPLANT
SUT VIC AB 2-0 CT1 27 (SUTURE) ×3
SUT VIC AB 2-0 CT1 TAPERPNT 27 (SUTURE) IMPLANT
SUT VIC AB 2-0 CTX 27 (SUTURE) IMPLANT
SUT VIC AB 3-0 SH 27 (SUTURE)
SUT VIC AB 3-0 SH 27X BRD (SUTURE) IMPLANT
SUT VIC AB 3-0 X1 27 (SUTURE) ×1 IMPLANT
SUT VICRYL 4-0 PS2 18IN ABS (SUTURE) IMPLANT
SUTURE E-PAK OPEN HEART (SUTURE) ×3 IMPLANT
SYSTEM SAHARA CHEST DRAIN ATS (WOUND CARE) ×3 IMPLANT
TAPE CLOTH SURG 4X10 WHT LF (GAUZE/BANDAGES/DRESSINGS) ×1 IMPLANT
TAPE PAPER 2X10 WHT MICROPORE (GAUZE/BANDAGES/DRESSINGS) ×1 IMPLANT
TOWEL OR 17X24 6PK STRL BLUE (TOWEL DISPOSABLE) ×3 IMPLANT
TOWEL OR 17X26 10 PK STRL BLUE (TOWEL DISPOSABLE) ×3 IMPLANT
TRAY FOLEY IC TEMP SENS 14FR (CATHETERS) ×3 IMPLANT
TUBING INSUFFLATION 10FT LAP (TUBING) ×3 IMPLANT
UNDERPAD 30X30 INCONTINENT (UNDERPADS AND DIAPERS) ×3 IMPLANT
WATER STERILE IRR 1000ML POUR (IV SOLUTION) ×6 IMPLANT

## 2013-11-02 NOTE — Anesthesia Procedure Notes (Signed)
Procedures The patient was identified and consent was given.  Hand hygiene and sterile gloves were used.  Following the time-out, the brachial  region was prepped and draped in a sterile fashion. The skin was anesthetized with local anesthesia.  The artery was located and a wire was inserted.  The skin was dilated and a 20cm, 20ga catheter was inserted.  The placement was confirmed by arterial flow.  The catheter was secured with a sterile dressing and sutured in place. The patient tolerated the procedure well. Start: 0750 End: 0800 J. Tedra Senegal, MD

## 2013-11-02 NOTE — Brief Op Note (Signed)
      [address]       [place] [address]             [phone]     10/27/2013 - 11/02/2013  11:19 AM  PATIENT:  Tammy Suarez  67 y.o. female  PRE-OPERATIVE DIAGNOSIS:  CAD  POST-OPERATIVE DIAGNOSIS:  CAD  PROCEDURE:  Procedure(s): CORONARY ARTERY BYPASS GRAFTING (CABG)X3 LIMA-LAD; SVG-OM; SVG-PD INTRAOPERATIVE TRANSESOPHAGEAL ECHOCARDIOGRAM [facility] RIGHT THIGH  SURGEON:  Surgeon(s): Gaye Pollack, MD  PHYSICIAN ASSISTANT: WAYNE GOLD PA-C  ANESTHESIA:   general  PATIENT CONDITION:  ICU - intubated and hemodynamically stable.  PRE-OPERATIVE WEIGHT: [id number]  COMPLICATIONS: NO KNOWN

## 2013-11-02 NOTE — Progress Notes (Signed)
Patient placed back on full vent support following wean mechanics.  VC 300, NIF -20, pH on ABG 7.30.  Discussed with RN, will attempt again later.

## 2013-11-02 NOTE — Preoperative (Signed)
Beta Blockers   Reason not to administer Beta Blockers:Not Applicable 

## 2013-11-02 NOTE — OR Nursing (Signed)
12:30pm - 2nd call to SICU

## 2013-11-02 NOTE — OR Nursing (Signed)
SICU 1st call made @1151 .

## 2013-11-02 NOTE — Anesthesia Postprocedure Evaluation (Signed)
  Anesthesia Post-op Note  Patient: Tammy Suarez  Procedure(s) Performed: Procedure(s) with comments: CORONARY ARTERY BYPASS GRAFTING (CABG) (N/A) - CABG x three, using left internal mammary artery and right leg greater saphenous vein harvested endoscopically INTRAOPERATIVE TRANSESOPHAGEAL ECHOCARDIOGRAM (N/A)  Patient Location: SICU  Anesthesia Type:General  Level of Consciousness: Patient remains intubated per anesthesia plan  Airway and Oxygen Therapy: Patient remains intubated per anesthesia plan  Post-op Pain: none  Post-op Assessment: Post-op Vital signs reviewed, Patient's Cardiovascular Status Stable, Respiratory Function Stable and Patent Airway  Post-op Vital Signs: Reviewed and stable  Complications: No apparent anesthesia complications

## 2013-11-02 NOTE — Progress Notes (Signed)
EVENING ROUNDS NOTE :     [address]       [place] [address]             [phone]                 Day of Surgery Procedure(s) (LRB): CORONARY ARTERY BYPASS GRAFTING (CABG) (N/A) INTRAOPERATIVE TRANSESOPHAGEAL ECHOCARDIOGRAM (N/A)  Total Length of Stay:  LOS: 6 days  BP 124/97  Pulse 73  Temp(Src) 97.2 F (36.2 C) (Core (Comment))  Resp 23  Ht 5\' 5"  (1.651 m)  Wt 211 lb 6.7 oz (95.9 kg)  BMI 35.18 kg/m2  SpO2 100%  .Intake/Output     12/18 0701 - 12/19 0700   P.O.    I.V. (mL/kg) 3285.9 (34.3)   Blood 455   IV Piggyback 350   Total Intake(mL/kg) 4090.9 (42.7)   Urine (mL/kg/hr) 1540 (1.1)   Blood 650 (0.5)   Chest Tube 246 (0.2)   Total Output 2436   Net +1654.9         . sodium chloride 20 mL/hr at 11/02/13 1500  . sodium chloride 20 mL/hr at 11/02/13 1500  . [START ON 11/03/2013] sodium chloride    . dexmedetomidine Stopped (11/02/13 1800)  . insulin (NOVOLIN-R) infusion 2.1 Units/hr (11/02/13 1424)  . lactated ringers 20 mL/hr (11/02/13 1330)  . nitroGLYCERIN 20 mcg/min (11/02/13 2100)  . phenylephrine (NEO-SYNEPHRINE) Adult infusion Stopped (11/02/13 1400)     Lab Results  Component Value Date   WBC 7.2 11/02/2013   HGB 10.9* 11/02/2013   HCT 30.9* 11/02/2013   PLT 158 11/02/2013   GLUCOSE 133* 11/02/2013   CHOL 162 08/29/2013   TRIG 268* 08/29/2013   HDL 33* 08/29/2013   LDLCALC 75 08/29/2013   ALT 8 10/28/2013   AST 21 10/28/2013   NA 137 11/02/2013   K 4.4 11/02/2013   CL 103 11/02/2013   CREATININE 1.26* 11/02/2013   BUN 28* 11/02/2013   CO2 19 11/02/2013   TSH 2.685 10/28/2013   INR 1.24 11/02/2013   HGBA1C 6.2* 10/28/2013   MICROALBUR 0.61 09/15/2013   Weaning vent, not bleeding  Tammy Isaac MD  Beeper [phone] Office [phone] 11/02/2013 9:22 PM

## 2013-11-02 NOTE — Anesthesia Preprocedure Evaluation (Addendum)
Anesthesia Evaluation  Patient identified by MRN, date of birth, ID band Patient awake    Reviewed: Allergy & Precautions, H&P , NPO status , Patient's Chart, lab work & pertinent test results, reviewed documented beta blocker date and time   History of Anesthesia Complications Negative for: history of anesthetic complications  Airway Mallampati: II TM Distance: >3 FB Neck ROM: Full    Dental  (+) Teeth Intact and Dental Advisory Given   Pulmonary Current Smoker,    Pulmonary exam normal       Cardiovascular hypertension, + Past MI  EF 55%   Neuro/Psych PSYCHIATRIC DISORDERS Anxiety Depression negative neurological ROS     GI/Hepatic GERD-  Medicated and Controlled,  Endo/Other  diabetes, Type 2Hypothyroidism   Renal/GU Renal InsufficiencyRenal disease     Musculoskeletal   Abdominal   Peds  Hematology   Anesthesia Other Findings   Reproductive/Obstetrics                          Anesthesia Physical Anesthesia Plan  ASA: IV  Anesthesia Plan: General   Post-op Pain Management:    Induction: Intravenous  Airway Management Planned: Oral ETT  Additional Equipment: Arterial line, CVP, PA Cath, 3D TEE, Ultrasound Guidance Line Placement and TEE  Intra-op Plan:   Post-operative Plan: Post-operative intubation/ventilation  Informed Consent:   Dental advisory given  Plan Discussed with: CRNA, Anesthesiologist and Surgeon  Anesthesia Plan Comments:         Anesthesia Quick Evaluation

## 2013-11-02 NOTE — Op Note (Signed)
CARDIOVASCULAR SURGERY OPERATIVE NOTE  11/02/2013  Surgeon:  Gaye Pollack, MD  First Assistant: Jadene Pierini,  [facility]   Preoperative Diagnosis:  Severe multi-vessel coronary artery disease   Postoperative Diagnosis:  Same   Procedure:  1. Median Sternotomy 2. Extracorporeal circulation 3.   Coronary artery bypass grafting x 3   Left internal mammary graft to the LAD  SVG to OM  SVG to PDA  4.   Endoscopic vein harvest from the right leg   Anesthesia:  General Endotracheal   Clinical History/Surgical Indication:  The patient is a 67 year old smoker with a hx of diabetes, HTN, hyperlipidemia, chronic kidney disease, and morbid obesity s/p gastric banding 4 years ago with 100 lb weight loss. She reports being in her usual state of health until 3 days prior to admission when she began having intermittent left sided chest pain radiating into the left jaw and left arm associated with exertional dyspnea. This pain was mostly in bed at night and not exertionally related. She also reports numbness in the left arm intermittently for 3 weeks. She ruled in for a NSTEMI with an initial Troponin poc of 0.89, peaking at 2.59 the next day. Cath shows severe 3- vessel coronary disease. LV gram was not done due to renal dysfunction. A 2D echo showed an EF of 55-60%. She has severe multi-vessel coronary disease with an occluded LCX, high grade LAD after large D1, and heavily calcified, high grade RCA stenoses. With a hx of diabetes I agree that CABG is the best treatment for her to prevent further ischemia and infarction and to improve her quality of life. She will need an echo to assess her LV function. If her creatinine remains stable I will plan to do surgery Thursday. I discussed the operative procedure with the patient including alternatives, benefits and risks; including but not limited to bleeding,  blood transfusion, infection, stroke, myocardial infarction, graft failure, heart block requiring a permanent pacemaker, organ dysfunction, and death. [name] understands and agrees to proceed.     Preparation:  The patient was seen in the preoperative holding area and the correct patient, correct operation were confirmed with the patient after reviewing the medical record and catheterization. The consent was signed by me. Preoperative antibiotics were given. A pulmonary arterial line and radial arterial line were placed by the anesthesia team. The patient was taken back to the operating room and positioned supine on the operating room table. After being placed under general endotracheal anesthesia by the anesthesia team a foley catheter was placed. The neck, chest, abdomen, and both legs were prepped with betadine soap and solution and draped in the usual sterile manner. A surgical time-out was taken and the correct patient and operative procedure were confirmed with the nursing and anesthesia staff.   Cardiopulmonary Bypass:  A median sternotomy was performed. The pericardium was opened in the midline. Right ventricular function appeared normal. The ascending aorta was of normal size and had no palpable plaque. There were no contraindications to aortic cannulation or cross-clamping. The patient was fully systemically heparinized and the ACT was maintained > 400 sec. The proximal aortic arch was cannulated with a 20 F aortic cannula for arterial inflow. Venous cannulation was performed via the right atrial appendage using a two-staged venous cannula. An antegrade cardioplegia/vent cannula was inserted into the mid-ascending aorta. Aortic occlusion was performed with a single cross-clamp. Systemic cooling to 32 degrees Centigrade and topical cooling of the heart with iced saline were  used. Hyperkalemic antegrade cold blood cardioplegia was used to induce diastolic arrest and was then given at  about 20 minute intervals throughout the period of arrest to maintain myocardial temperature at or below 10 degrees centigrade. A temperature probe was inserted into the interventricular septum and an insulating pad was placed in the pericardium.   Left internal mammary harvest:  The left side of the sternum was retracted using the Rultract retractor. The left internal mammary artery was harvested as a pedicle graft. All side branches were clipped. It was a medium-sized vessel of good quality with excellent blood flow. It was ligated distally and divided. It was sprayed with topical papaverine solution to prevent vasospasm.   Endoscopic vein harvest:  The right greater saphenous vein was harvested endoscopically through a 2 cm incision medial to the right knee. It was harvested from the upper thigh to below the knee. It was a medium-sized vein of good quality. The side branches were all ligated with 4-0 silk ties.    Coronary arteries:  The coronary arteries were examined.   LAD:  It was deep intramyocardial throughout its proximal and mid-portion and exited to the surface of the heart distally where it was small but graftable with no disease. I tried to locate it more proximally within the myocardium for 30 minutes but could not locate it. I was deep in the myocardium of the septum and was concerned about entering the RV so I decided to graft it distally. When the distal vessel was opened I passed a probe proximally and the LAD was deep in the septum.  LCX:  Large OM with heavy proximal disease but graftable distally.  RCA:  Diffusely diseased with calcific plaque that extended throughout the PDA. There was one spot in the PDA that was soft enough to graft.   Grafts:  1. LIMA to the LAD: 1.25 mm distally. It was sewn end to side using 8-0 prolene continuous suture. 2. SVG to OM:  1.6 mm. It was sewn end to side using 7-0 prolene continuous suture. 3. SVG to PDA:  1.6 mm. It was sewn end  to side using 7-0 prolene continuous suture.  The proximal vein graft anastomoses were performed to the mid-ascending aorta using continuous 6-0 prolene suture. Graft markers were placed around the proximal anastomoses.   Completion:  The patient was rewarmed to 37 degrees Centigrade. The clamp was removed from the LIMA pedicle and there was rapid warming of the septum and return of ventricular fibrillation. The crossclamp was removed with a time of 83 minutes. There was spontaneous return of sinus rhythm. The distal and proximal anastomoses were checked for hemostasis. The position of the grafts was satisfactory. Two temporary epicardial pacing wires were placed on the right atrium and two on the right ventricle. The patient was weaned from CPB without difficulty on no inotropes. CPB time was 99 minutes. Cardiac output was 5 LPM. Heparin was fully reversed with protamine and the aortic and venous cannulas removed. Hemostasis was achieved. Mediastinal and left pleural drainage tubes were placed. The sternum was closed with double #6 stainless steel wires. The fascia was closed with continuous # 1 vicryl suture. The subcutaneous tissue was closed with 2-0 vicryl continuous suture. The skin was closed with 3-0 vicryl subcuticular suture. All sponge, needle, and instrument counts were reported correct at the end of the case. Dry sterile dressings were placed over the incisions and around the chest tubes which were connected to pleurevac suction. The patient was  then transported to the surgical intensive care unit in critical but stable condition.

## 2013-11-02 NOTE — Transfer of Care (Signed)
Immediate Anesthesia Transfer of Care Note  Patient: Tammy Suarez  Procedure(s) Performed: Procedure(s) with comments: CORONARY ARTERY BYPASS GRAFTING (CABG) (N/A) - CABG x three, using left internal mammary artery and right leg greater saphenous vein harvested endoscopically INTRAOPERATIVE TRANSESOPHAGEAL ECHOCARDIOGRAM (N/A)  Patient Location: SICU  Anesthesia Type:General  Level of Consciousness: Patient remains intubated per anesthesia plan  Airway & Oxygen Therapy: Patient remains intubated per anesthesia plan  Post-op Assessment: Report given to PACU RN and Post -op Vital signs reviewed and stable  Post vital signs: Reviewed and stable  Complications: No apparent anesthesia complications

## 2013-11-03 ENCOUNTER — Inpatient Hospital Stay (HOSPITAL_COMMUNITY): Payer: Medicare Other

## 2013-11-03 LAB — POCT I-STAT 3, ART BLOOD GAS (G3+)
Acid-base deficit: 4 mmol/L — ABNORMAL HIGH (ref 0.0–2.0)
Acid-base deficit: 3 mmol/L — ABNORMAL HIGH (ref 0.0–2.0)
Acid-base deficit: 3 mmol/L — ABNORMAL HIGH (ref 0.0–2.0)
O2 Saturation: 96 %
O2 Saturation: 94 %
O2 Saturation: 95 %
Patient temperature: 36.79
Patient temperature: 36.7
Patient temperature: 36.9
TCO2: 24 mmol/L (ref 0–100)
TCO2: 24 mmol/L (ref 0–100)
TCO2: 25 mmol/L (ref 0–100)
pCO2 arterial: 48 mmHg — ABNORMAL HIGH (ref 35.0–45.0)
pH, Arterial: 7.32 — ABNORMAL LOW (ref 7.350–7.450)
pH, Arterial: 7.298 — ABNORMAL LOW (ref 7.350–7.450)
pH, Arterial: 7.305 — ABNORMAL LOW (ref 7.350–7.450)
pO2, Arterial: 87 mmHg (ref 80.0–100.0)
pO2, Arterial: 82 mmHg (ref 80.0–100.0)

## 2013-11-03 LAB — GLUCOSE, CAPILLARY
Glucose-Capillary: 158 mg/dL — ABNORMAL HIGH (ref 70–99)
Glucose-Capillary: 121 mg/dL — ABNORMAL HIGH (ref 70–99)
Glucose-Capillary: 111 mg/dL — ABNORMAL HIGH (ref 70–99)
Glucose-Capillary: 116 mg/dL — ABNORMAL HIGH (ref 70–99)
Glucose-Capillary: 102 mg/dL — ABNORMAL HIGH (ref 70–99)
Glucose-Capillary: 97 mg/dL (ref 70–99)
Glucose-Capillary: 94 mg/dL (ref 70–99)
Glucose-Capillary: 101 mg/dL — ABNORMAL HIGH (ref 70–99)
Glucose-Capillary: 107 mg/dL — ABNORMAL HIGH (ref 70–99)

## 2013-11-03 LAB — MAGNESIUM: Magnesium: 2 mg/dL (ref 1.5–2.5)

## 2013-11-03 LAB — CBC
HCT: 31.6 % — ABNORMAL LOW (ref 36.0–46.0)
HCT: 29.1 % — ABNORMAL LOW (ref 36.0–46.0)
Hemoglobin: 10.7 g/dL — ABNORMAL LOW (ref 12.0–15.0)
MCH: 31.1 pg (ref 26.0–34.0)
MCH: 31.3 pg (ref 26.0–34.0)
MCHC: 33.9 g/dL (ref 30.0–36.0)
MCHC: 33.7 g/dL (ref 30.0–36.0)
MCV: 91.9 fL (ref 78.0–100.0)
Platelets: 165 10*3/uL (ref 150–400)
Platelets: 147 10*3/uL — ABNORMAL LOW (ref 150–400)
RBC: 3.44 MIL/uL — ABNORMAL LOW (ref 3.87–5.11)
RDW: 14.6 % (ref 11.5–15.5)
WBC: 8.5 10*3/uL (ref 4.0–10.5)
WBC: 6.9 10*3/uL (ref 4.0–10.5)

## 2013-11-03 LAB — CREATININE, SERUM
Creatinine, Ser: 1.51 mg/dL — ABNORMAL HIGH (ref 0.50–1.10)
GFR calc non Af Amer: 35 mL/min — ABNORMAL LOW (ref >90)

## 2013-11-03 LAB — PREPARE PLATELET PHERESIS: Unit division: 0

## 2013-11-03 LAB — BASIC METABOLIC PANEL
BUN: 18 mg/dL (ref 6–23)
CO2: 23 mEq/L (ref 19–32)
Calcium: 8.2 mg/dL — ABNORMAL LOW (ref 8.4–10.5)
Chloride: 101 mEq/L (ref 96–112)
Creatinine, Ser: 1.27 mg/dL — ABNORMAL HIGH (ref 0.50–1.10)
Glucose, Bld: 113 mg/dL — ABNORMAL HIGH (ref 70–99)
Sodium: 133 mEq/L — ABNORMAL LOW (ref 135–145)

## 2013-11-03 LAB — POCT I-STAT, CHEM 8
BUN: 20 mg/dL (ref 6–23)
Chloride: 96 mEq/L (ref 96–112)
Glucose, Bld: 100 mg/dL — ABNORMAL HIGH (ref 70–99)
Potassium: 4.3 mEq/L (ref 3.5–5.1)
Sodium: 135 mEq/L (ref 135–145)
TCO2: 26 mmol/L (ref 0–100)

## 2013-11-03 LAB — PLATELET COUNT: Platelets: 99 10*3/uL — ABNORMAL LOW (ref 150–400)

## 2013-11-03 MED ORDER — AMITRIPTYLINE HCL 25 MG PO TABS
25.0000 mg | ORAL_TABLET | Freq: Every day | ORAL | Status: DC
  Administered 2013-11-03 – 2013-11-05 (×3): 25 mg via ORAL
  Filled 2013-11-03 (×4): qty 1

## 2013-11-03 MED ORDER — INSULIN ASPART 100 UNIT/ML ~~LOC~~ SOLN
0.0000 [IU] | SUBCUTANEOUS | Status: DC
  Administered 2013-11-04: 2 [IU] via SUBCUTANEOUS

## 2013-11-03 MED ORDER — ENOXAPARIN SODIUM 40 MG/0.4ML ~~LOC~~ SOLN
40.0000 mg | Freq: Every day | SUBCUTANEOUS | Status: DC
  Administered 2013-11-03 – 2013-11-05 (×3): 40 mg via SUBCUTANEOUS
  Filled 2013-11-03 (×4): qty 0.4

## 2013-11-03 MED ORDER — SODIUM BICARBONATE 8.4 % IV SOLN
50.0000 meq | Freq: Once | INTRAVENOUS | Status: AC
  Administered 2013-11-03: 50 meq via INTRAVENOUS
  Filled 2013-11-03: qty 50

## 2013-11-03 MED ORDER — FUROSEMIDE 10 MG/ML IJ SOLN
40.0000 mg | Freq: Once | INTRAMUSCULAR | Status: AC
  Administered 2013-11-03: 40 mg via INTRAVENOUS

## 2013-11-03 MED ORDER — DULOXETINE HCL 60 MG PO CPEP
60.0000 mg | ORAL_CAPSULE | Freq: Two times a day (BID) | ORAL | Status: DC
  Administered 2013-11-03 – 2013-11-06 (×7): 60 mg via ORAL
  Filled 2013-11-03 (×9): qty 1

## 2013-11-03 MED FILL — Lidocaine HCl IV Inj 20 MG/ML: INTRAVENOUS | Qty: 5 | Status: AC

## 2013-11-03 MED FILL — Sodium Chloride IV Soln 0.9%: INTRAVENOUS | Qty: 2000 | Status: AC

## 2013-11-03 MED FILL — Mannitol IV Soln 20%: INTRAVENOUS | Qty: 500 | Status: AC

## 2013-11-03 MED FILL — Heparin Sodium (Porcine) Inj 1000 Unit/ML: INTRAMUSCULAR | Qty: 20 | Status: AC

## 2013-11-03 MED FILL — Electrolyte-R (PH 7.4) Solution: INTRAVENOUS | Qty: 6000 | Status: AC

## 2013-11-03 MED FILL — Sodium Bicarbonate IV Soln 8.4%: INTRAVENOUS | Qty: 50 | Status: AC

## 2013-11-03 NOTE — Addendum Note (Signed)
Addendum created 11/03/13 1103 by Suzy Bouchard, CRNA   Modules edited: Anesthesia Medication Administration, Anesthesia Responsible Staff

## 2013-11-03 NOTE — Progress Notes (Signed)
1 Day Post-Op Procedure(s) (LRB): CORONARY ARTERY BYPASS GRAFTING (CABG) (N/A) INTRAOPERATIVE TRANSESOPHAGEAL ECHOCARDIOGRAM (N/A) Subjective: No complaints  Objective: Vital signs in last 24 hours: Temp:  [96.1 F (35.6 C)-99 F (37.2 C)] 99 F (37.2 C) (12/19 0700) Pulse Rate:  [71-90] 71 (12/19 0700) Cardiac Rhythm:  [-] Normal sinus rhythm (12/19 0700) Resp:  [12-23] 14 (12/19 0700) BP: (86-133)/(51-101) 104/57 mmHg (12/19 0700) SpO2:  [96 %-100 %] 98 % (12/19 0700) Arterial Line BP: (90-163)/(51-92) 117/58 mmHg (12/19 0700) FiO2 (%):  [40 %-50 %] 40 % (12/19 0110) Weight:  [95.9 kg (211 lb 6.7 oz)-103.4 kg (227 lb 15.3 oz)] 103.4 kg (227 lb 15.3 oz) (12/19 0500)  Hemodynamic parameters for last 24 hours: PAP: (21-37)/(14-25) 31/19 mmHg CO:  [3.1 L/min-4.1 L/min] 4 L/min CI:  [1.6 L/min/m2-2 L/min/m2] 2 L/min/m2  Intake/Output from previous day: 12/18 0701 - 12/19 0700 In: 4794.2 [I.V.:3789.2; Blood:455; IV Piggyback:550] Out: [id number]; Blood:650; Chest Tube:506] Intake/Output this shift:    General appearance: alert and cooperative Neurologic: intact Heart: regular rate and rhythm, S1, S2 normal, no murmur, click, rub or gallop Lungs: clear to auscultation bilaterally Extremities: extremities normal, atraumatic, no cyanosis or edema Wound: dressing dry  Lab Results:  Recent Labs  11/02/13 2000 11/02/13 2001 11/03/13 0400  WBC 7.2  --  8.5  HGB 10.9* 10.9* 10.7*  HCT 30.9* 32.0* 31.6*  PLT 158  --  165   BMET:  Recent Labs  11/02/13 0500  11/02/13 2001 11/03/13 0400  NA 134*  < > 137 133*  K 4.8  < > 4.6 4.5  CL 103  --  102 101  CO2 19  --   --  23  GLUCOSE 108*  < > 130* 113*  BUN 28*  --  21 18  CREATININE 1.58*  < > 1.30* 1.27*  CALCIUM 9.6  --   --  8.2*  < > = values in this interval not displayed.  PT/INR:  Recent Labs  11/02/13 1320  LABPROT 15.3*  INR 1.24   ABG    Component Value Date/Time   PHART 7.305* 11/03/2013  0404   HCO3 23.9 11/03/2013 0404   TCO2 25 11/03/2013 0404   ACIDBASEDEF 3.0* 11/03/2013 0404   O2SAT 95.0 11/03/2013 0404   CBG (last 3)   Recent Labs  11/03/13 0505 11/03/13 0609 11/03/13 0702  GLUCAP 102* 99 97   CXR: clear  Assessment/Plan: S/P Procedure(s) (LRB): CORONARY ARTERY BYPASS GRAFTING (CABG) (N/A) INTRAOPERATIVE TRANSESOPHAGEAL ECHOCARDIOGRAM (N/A) Mobilize Diuresis Diabetes control d/c tubes/lines Continue foley due to patient in ICU and urinary output monitoring See progression orders   LOS: 7 days    Rucha Wissinger K 11/03/2013

## 2013-11-03 NOTE — Progress Notes (Signed)
Patient Name: Tammy Suarez Date of Encounter: 11/03/2013  Active Problems:   NSTEMI (non-ST elevated myocardial infarction)   S/P CABG x 3   Length of Stay: 7  SUBJECTIVE  1. Post op day post CABG x 3. Feeling well, no complain other than pain at the surgical site.  CURRENT MEDS . acetaminophen  1,000 mg Oral Q6H   Or  . acetaminophen (TYLENOL) oral liquid 160 mg/5 mL  1,000 mg Per Tube Q6H  . amitriptyline  25 mg Oral QHS  . aspirin EC  325 mg Oral Daily   Or  . aspirin  324 mg Per Tube Daily  . bisacodyl  10 mg Oral Daily   Or  . bisacodyl  10 mg Rectal Daily  . buPROPion  300 mg Oral Daily  . cefUROXime (ZINACEF)  IV  1.5 g Intravenous Q12H  . docusate sodium  200 mg Oral Daily  . DULoxetine  60 mg Oral BID  . enoxaparin (LOVENOX) injection  40 mg Subcutaneous QHS  . famotidine (PEPCID) IV  20 mg Intravenous Q12H  . insulin aspart  0-24 Units Subcutaneous Q4H  . insulin regular  0-10 Units Intravenous TID WC  . levothyroxine  50 mcg Oral QAC breakfast  . metoprolol tartrate  12.5 mg Oral BID   Or  . metoprolol tartrate  12.5 mg Per Tube BID  . [START ON 11/04/2013] pantoprazole  40 mg Oral Daily  . simvastatin  20 mg Oral q1800  . sodium chloride  3 mL Intravenous Q12H    OBJECTIVE  Filed Vitals:   11/03/13 0615 11/03/13 0630 11/03/13 0645 11/03/13 0700  BP:    104/57  Pulse: 75 74 71 71  Temp: 98.8 F (37.1 C) 99 F (37.2 C) 99 F (37.2 C) 99 F (37.2 C)  TempSrc:      Resp: 21 17 15 14   Height:      Weight:      SpO2: 98% 97% 98% 98%    Intake/Output Summary (Last 24 hours) at 11/03/13 0856 Last data filed at 11/03/13 0700  Gross per 24 hour  Intake 4794.2 ml  Output   3621 ml  Net 1173.2 ml   Filed Weights   10/28/13 0302 11/02/13 1315 11/03/13 0500  Weight: 211 lb 6.7 oz (95.9 kg) 211 lb 6.7 oz (95.9 kg) 227 lb 15.3 oz (103.4 kg)    PHYSICAL EXAM  General: Pleasant, NAD. Neuro: Alert and oriented X 3. Moves all extremities  spontaneously. Psych: Normal affect. HEENT:  Normal  Neck: Supple without bruits or JVD. Lungs:  Resp regular and unlabored, CTA. Heart: RRR no s3, s4, or murmurs. Abdomen: Soft, non-tender, non-distended, BS + x 4.  Extremities: No clubbing, cyanosis or edema. DP/PT/Radials 2+ and equal bilaterally.  Accessory Clinical Findings  CBC  Recent Labs  11/02/13 2000 11/02/13 2001 11/03/13 0400  WBC 7.2  --  8.5  HGB 10.9* 10.9* 10.7*  HCT 30.9* 32.0* 31.6*  MCV 90.4  --  91.9  PLT 158  --  [id number]   Basic Metabolic Panel  Recent Labs  11/02/13 0500  11/02/13 2000 11/02/13 2001 11/03/13 0400  NA 134*  < >  --  137 133*  K 4.8  < >  --  4.6 4.5  CL 103  --   --  102 101  CO2 19  --   --   --  23  GLUCOSE 108*  < >  --  130* 113*  BUN 28*  --   --  21 18  CREATININE 1.58*  --  1.26* 1.30* 1.27*  CALCIUM 9.6  --   --   --  8.2*  MG  --   --  2.5  --  2.0  < > = values in this interval not displayed.  Dg Chest Portable 1 View In Am  11/03/2013   CLINICAL DATA:  Postoperative evaluation.  EXAM: PORTABLE CHEST - 1 VIEW  COMPARISON:  11/02/2013  FINDINGS: Cardiac shadow is stable. The endotracheal tube and nasogastric catheter been removed. A mediastinal drain and left thoracostomy catheter remain in satisfactory position. No pneumothorax is noted. A Swan-Ganz catheter is noted in the pulmonary outflow tract. No focal infiltrate is noted.  IMPRESSION: No acute abnormality noted.   Electronically Signed   By: Inez Catalina M.D.   On: 11/03/2013 07:49   TELE  SR, 70-80 BPM  TTE; 11/01/13 Left ventricle: The cavity size was normal. Wall thickness was normal. Systolic function was normal. The estimated ejection fraction was in the range of 55% to 60%. Wall motion was normal; there were no regional wall motion abnormalities. There was an increased relative contribution of atrial contraction to ventricular filling.    ASSESSMENT AND PLAN  67 year old female   1. NSTEMI,  1.post op day, Coronary artery bypass grafting x 3 (Left internal mammary graft to the LAD, SVG to OM, SVG to PDA). LVEF preserved, no sign sof fluid overload post surgery. Doing very well, extubated, she will start ambulating today.  Continue ASA, BB, statin, possibly ACEI once Crea improves  2. Type II diet controlled DM   3. HTN - controlled   4. Dyslipidemia - on simvastatin  5. CKD - stable, Creatinine has improved.   Signed, Ena Dawley, H MD, [facility] 11/03/2013

## 2013-11-03 NOTE — Procedures (Signed)
Extubation Procedure Note  Patient Details:   Name: Tammy Suarez DOB: [birth year] MRN: [id number]   Airway Documentation:   Patient extubated to 4 lpm nasal cannula.  VC 730 ml, NIF -40, patient able to hold head off bed.  Patient able to breathe around deflated cuff and vocalize post procedure.  Tolerated well, no complications note.    Evaluation  O2 sats: stable throughout Complications: No apparent complications Patient did tolerate procedure well. Bilateral Breath Sounds: Clear Suctioning: Airway Yes  Zadin Lange P 11/03/2013, 2:02 AM

## 2013-11-03 NOTE — Progress Notes (Signed)
Patient ID: Tammy Suarez, female   DOB: May 04, 1946, 67 y.o.   MRN: [id number]  SICU Evening Rounds:  Hemodynamically stable  Ambulated well  Good diuresis.

## 2013-11-04 ENCOUNTER — Inpatient Hospital Stay (HOSPITAL_COMMUNITY): Payer: Medicare Other

## 2013-11-04 LAB — GLUCOSE, CAPILLARY
Glucose-Capillary: 145 mg/dL — ABNORMAL HIGH (ref 70–99)
Glucose-Capillary: 99 mg/dL (ref 70–99)
Glucose-Capillary: 102 mg/dL — ABNORMAL HIGH (ref 70–99)

## 2013-11-04 LAB — BASIC METABOLIC PANEL
BUN: 22 mg/dL (ref 6–23)
CO2: 29 mEq/L (ref 19–32)
Calcium: 8.2 mg/dL — ABNORMAL LOW (ref 8.4–10.5)
Chloride: 97 mEq/L (ref 96–112)
GFR calc non Af Amer: 35 mL/min — ABNORMAL LOW (ref >90)
Potassium: 4.3 mEq/L (ref 3.5–5.1)
Sodium: 132 mEq/L — ABNORMAL LOW (ref 135–145)

## 2013-11-04 LAB — CBC
Hemoglobin: 16.3 g/dL — ABNORMAL HIGH (ref 12.0–15.0)
Hemoglobin: 9.3 g/dL — ABNORMAL LOW (ref 12.0–15.0)
MCH: 30.9 pg (ref 26.0–34.0)
MCHC: 33.7 g/dL (ref 30.0–36.0)
MCV: 92.8 fL (ref 78.0–100.0)
MCV: 91.7 fL (ref 78.0–100.0)
Platelets: DECREASED 10*3/uL (ref 150–400)
RBC: 5.11 MIL/uL (ref 3.87–5.11)
RBC: 3.01 MIL/uL — ABNORMAL LOW (ref 3.87–5.11)
RDW: 14.2 % (ref 11.5–15.5)
WBC: 2.6 10*3/uL — ABNORMAL LOW (ref 4.0–10.5)

## 2013-11-04 MED ORDER — ASPIRIN EC 325 MG PO TBEC
325.0000 mg | DELAYED_RELEASE_TABLET | Freq: Every day | ORAL | Status: DC
  Administered 2013-11-05 – 2013-11-06 (×2): 325 mg via ORAL
  Filled 2013-11-04 (×3): qty 1

## 2013-11-04 MED ORDER — ONDANSETRON HCL 4 MG PO TABS
4.0000 mg | ORAL_TABLET | Freq: Four times a day (QID) | ORAL | Status: DC | PRN
  Administered 2013-11-04 – 2013-11-05 (×2): 4 mg via ORAL
  Filled 2013-11-04 (×2): qty 1

## 2013-11-04 MED ORDER — FUROSEMIDE 40 MG PO TABS
40.0000 mg | ORAL_TABLET | Freq: Every day | ORAL | Status: AC
  Administered 2013-11-04 – 2013-11-06 (×3): 40 mg via ORAL
  Filled 2013-11-04 (×3): qty 1

## 2013-11-04 MED ORDER — MOVING RIGHT ALONG BOOK
Freq: Once | Status: AC
  Administered 2013-11-04: 18:00:00
  Filled 2013-11-04: qty 1

## 2013-11-04 MED ORDER — INSULIN ASPART 100 UNIT/ML ~~LOC~~ SOLN: 0.0000 [IU] | Freq: Three times a day (TID) | SUBCUTANEOUS | Status: DC

## 2013-11-04 MED ORDER — DOCUSATE SODIUM 100 MG PO CAPS
200.0000 mg | ORAL_CAPSULE | Freq: Every day | ORAL | Status: DC
  Administered 2013-11-05 – 2013-11-06 (×2): 200 mg via ORAL
  Filled 2013-11-04 (×2): qty 2

## 2013-11-04 MED ORDER — METOPROLOL TARTRATE 12.5 MG HALF TABLET
12.5000 mg | ORAL_TABLET | Freq: Two times a day (BID) | ORAL | Status: DC
  Administered 2013-11-04 – 2013-11-06 (×4): 12.5 mg via ORAL
  Filled 2013-11-04 (×5): qty 1

## 2013-11-04 MED ORDER — DARIFENACIN HYDROBROMIDE ER 15 MG PO TB24
15.0000 mg | ORAL_TABLET | Freq: Every day | ORAL | Status: DC
  Administered 2013-11-04 – 2013-11-06 (×3): 15 mg via ORAL
  Filled 2013-11-04 (×3): qty 1

## 2013-11-04 MED ORDER — ONDANSETRON HCL 4 MG/2ML IJ SOLN
4.0000 mg | Freq: Four times a day (QID) | INTRAMUSCULAR | Status: DC | PRN
  Administered 2013-11-06: 4 mg via INTRAVENOUS
  Filled 2013-11-04: qty 2

## 2013-11-04 MED ORDER — BISACODYL 10 MG RE SUPP: 10.0000 mg | Freq: Every day | RECTAL | Status: DC | PRN

## 2013-11-04 MED ORDER — OXYCODONE HCL 5 MG PO TABS
5.0000 mg | ORAL_TABLET | ORAL | Status: DC | PRN
  Administered 2013-11-04: 10 mg via ORAL
  Administered 2013-11-05: 5 mg via ORAL
  Administered 2013-11-05: 10 mg via ORAL
  Filled 2013-11-04 (×2): qty 2
  Filled 2013-11-04: qty 1
  Filled 2013-11-04: qty 2

## 2013-11-04 MED ORDER — SODIUM CHLORIDE 0.9 % IJ SOLN: 3.0000 mL | INTRAMUSCULAR | Status: DC | PRN

## 2013-11-04 MED ORDER — POTASSIUM CHLORIDE CRYS ER 20 MEQ PO TBCR
20.0000 meq | EXTENDED_RELEASE_TABLET | Freq: Every day | ORAL | Status: DC
  Administered 2013-11-05 – 2013-11-06 (×2): 20 meq via ORAL
  Filled 2013-11-04 (×2): qty 1

## 2013-11-04 MED ORDER — SODIUM CHLORIDE 0.9 % IJ SOLN
3.0000 mL | Freq: Two times a day (BID) | INTRAMUSCULAR | Status: DC
  Administered 2013-11-04 – 2013-11-05 (×3): 3 mL via INTRAVENOUS

## 2013-11-04 MED ORDER — FAMOTIDINE 20 MG PO TABS
20.0000 mg | ORAL_TABLET | Freq: Two times a day (BID) | ORAL | Status: DC
  Administered 2013-11-04 – 2013-11-06 (×4): 20 mg via ORAL
  Filled 2013-11-04 (×5): qty 1

## 2013-11-04 MED ORDER — BISACODYL 5 MG PO TBEC: 10.0000 mg | DELAYED_RELEASE_TABLET | Freq: Every day | ORAL | Status: DC | PRN

## 2013-11-04 MED ORDER — TRAMADOL HCL 50 MG PO TABS
50.0000 mg | ORAL_TABLET | ORAL | Status: DC | PRN
  Administered 2013-11-04: 100 mg via ORAL
  Administered 2013-11-05 (×2): 50 mg via ORAL
  Filled 2013-11-04: qty 2
  Filled 2013-11-04: qty 1
  Filled 2013-11-04: qty 2

## 2013-11-04 MED ORDER — SODIUM CHLORIDE 0.9 % IV SOLN: 250.0000 mL | INTRAVENOUS | Status: DC | PRN

## 2013-11-04 NOTE — Progress Notes (Signed)
Patient still complains of chest pain, per patient, it has not improved, morphine 2 mg administered, will monitor patient, MD rounding on unit made aware that pain is persistent, still okay to transfer patient when bed is available.    Informed patient on importance of sternal precautions and splinting her chest when changing positions and coughing.  Patient understands.   Will re-educate as needed.

## 2013-11-04 NOTE — Progress Notes (Signed)
2 Days Post-Op Procedure(s) (LRB): CORONARY ARTERY BYPASS GRAFTING (CABG) (N/A) INTRAOPERATIVE TRANSESOPHAGEAL ECHOCARDIOGRAM (N/A) Subjective: Sore but otherwise ok  Objective: Vital signs in last 24 hours: Temp:  [97.4 F (36.3 C)-98.4 F (36.9 C)] 97.5 F (36.4 C) (12/20 0826) Pulse Rate:  [57-74] 74 (12/20 0900) Cardiac Rhythm:  [-] Normal sinus rhythm (12/20 0900) Resp:  [10-30] 11 (12/20 0900) BP: (91-132)/(43-90) 120/58 mmHg (12/20 0900) SpO2:  [93 %-98 %] 96 % (12/20 0900) Arterial Line BP: (93-122)/(42-52) 93/42 mmHg (12/19 1300) Weight:  [104.2 kg (229 lb 11.5 oz)] 104.2 kg (229 lb 11.5 oz) (12/20 0500)  Hemodynamic parameters for last 24 hours:    Intake/Output from previous day: 12/19 0701 - 12/20 0700 In: 702.8 [P.O.:100; I.V.:502.8; IV Piggyback:100] Out: [id number] [Urine:3235; Chest Tube:20] Intake/Output this shift: Total I/O In: 90 [I.V.:40; IV Piggyback:50] Out: 205 [Urine:205]  General appearance: alert and cooperative Heart: regular rate and rhythm, S1, S2 normal, no murmur, click, rub or gallop Lungs: clear to auscultation bilaterally Extremities: edema mild Wound: dressings dry  Lab Results:  Recent Labs  11/04/13 0418 11/04/13 0522  WBC 2.6* 5.8  HGB 16.3* 9.3*  HCT 47.4* 27.6*  PLT PLATELET CLUMPS NOTED ON SMEAR, COUNT APPEARS DECREASED 150   BMET:  Recent Labs  11/03/13 0400 11/03/13 1600 11/03/13 1616 11/04/13 0418  NA 133* 135  --  132*  K 4.5 4.3  --  4.3  CL 101 96  --  97  CO2 23  --   --  29  GLUCOSE 113* 100*  --  109*  BUN 18 20  --  22  CREATININE 1.27* 1.60* 1.51* 1.48*  CALCIUM 8.2*  --   --  8.2*    PT/INR:  Recent Labs  11/02/13 1320  LABPROT 15.3*  INR 1.24   ABG    Component Value Date/Time   PHART 7.305* 11/03/2013 0404   HCO3 23.9 11/03/2013 0404   TCO2 26 11/03/2013 1600   ACIDBASEDEF 3.0* 11/03/2013 0404   O2SAT 95.0 11/03/2013 0404   CBG (last 3)   Recent Labs  11/04/13 0002 11/04/13 0343  11/04/13 0823  GLUCAP 145* 103* 99    Assessment/Plan: S/P Procedure(s) (LRB): CORONARY ARTERY BYPASS GRAFTING (CABG) (N/A) INTRAOPERATIVE TRANSESOPHAGEAL ECHOCARDIOGRAM (N/A) Mobilize Diuresis Chronic kidney disease. Creat stable Diabetes control: Hgb A1c of 6.2 preop. She has not been on any medications for DM. CBG's under good control. Will follow with [facility]. Plan for transfer to step-down: see transfer orders   LOS: 8 days    Sunya Humbarger K 11/04/2013

## 2013-11-04 NOTE — Progress Notes (Signed)
Patient transferred to 2W21 with monitor.  Patient stable, no acute changes, patient did state she did hurt once she was back in bed on 2W, Bedside RN made aware.  Patient neuro intact.  Education charted. Safety maintained

## 2013-11-05 LAB — TYPE AND SCREEN
Unit division: 0
Unit division: 0

## 2013-11-05 LAB — CBC
HCT: 31.7 % — ABNORMAL LOW (ref 36.0–46.0)
Hemoglobin: 10.5 g/dL — ABNORMAL LOW (ref 12.0–15.0)
MCV: 93.5 fL (ref 78.0–100.0)
RDW: 13.6 % (ref 11.5–15.5)
WBC: 6.5 10*3/uL (ref 4.0–10.5)

## 2013-11-05 LAB — BASIC METABOLIC PANEL
BUN: 22 mg/dL (ref 6–23)
Chloride: 93 mEq/L — ABNORMAL LOW (ref 96–112)
Creatinine, Ser: 1.4 mg/dL — ABNORMAL HIGH (ref 0.50–1.10)
Glucose, Bld: 96 mg/dL (ref 70–99)
Potassium: 4.4 mEq/L (ref 3.5–5.1)

## 2013-11-05 LAB — GLUCOSE, CAPILLARY: Glucose-Capillary: 87 mg/dL (ref 70–99)

## 2013-11-05 NOTE — Progress Notes (Addendum)
      [address]       [place] [address]             [phone]      3 Days Post-Op Procedure(s) (LRB): CORONARY ARTERY BYPASS GRAFTING (CABG) (N/A) INTRAOPERATIVE TRANSESOPHAGEAL ECHOCARDIOGRAM (N/A)  Subjective:  Tammy Suarez has no new complaints this morning.  States she is feeling pretty good.  + ambulation, + BM  Objective: Vital signs in last 24 hours: Temp:  [97.9 F (36.6 C)-98.6 F (37 C)] 98.6 F (37 C) (12/21 0305) Pulse Rate:  [62-88] 88 (12/21 0305) Cardiac Rhythm:  [-] Normal sinus rhythm;Bundle branch block (12/20 2000) Resp:  [14-23] 17 (12/21 0305) BP: (101-144)/(43-82) 116/50 mmHg (12/21 0305) SpO2:  [91 %-99 %] 97 % (12/21 0305) Weight:  [221 lb 12.5 oz (100.6 kg)] 221 lb 12.5 oz (100.6 kg) (12/21 0305)  Intake/Output from previous day: 12/20 0701 - 12/21 0700 In: 133 [I.V.:83; IV Piggyback:50] Out: [id number] [Urine:1735] Intake/Output this shift: Total I/O In: 120 [P.O.:120] Out: 600 [Urine:600]  General appearance: alert, cooperative and no distress Heart: regular rate and rhythm Lungs: clear to auscultation bilaterally Abdomen: soft, non-tender; bowel sounds normal; no masses,  no organomegaly Extremities: edema trace Wound: clean and dryu  Lab Results:  Recent Labs  11/04/13 0522 11/05/13 0430  WBC 5.8 6.5  HGB 9.3* 10.5*  HCT 27.6* 31.7*  PLT 150 173   BMET:  Recent Labs  11/04/13 0418 11/05/13 0430  NA 132* 133*  K 4.3 4.4  CL 97 93*  CO2 29 31  GLUCOSE 109* 96  BUN 22 22  CREATININE 1.48* 1.40*  CALCIUM 8.2* 9.4    PT/INR:  Recent Labs  11/02/13 1320  LABPROT 15.3*  INR 1.24   ABG    Component Value Date/Time   PHART 7.305* 11/03/2013 0404   HCO3 23.9 11/03/2013 0404   TCO2 26 11/03/2013 1600   ACIDBASEDEF 3.0* 11/03/2013 0404   O2SAT 95.0 11/03/2013 0404   CBG (last 3)   Recent Labs  11/04/13 1239 11/04/13 2148 11/05/13 0616  GLUCAP 108* 102* 87    Assessment/Plan: S/P  Procedure(s) (LRB): CORONARY ARTERY BYPASS GRAFTING (CABG) (N/A) INTRAOPERATIVE TRANSESOPHAGEAL ECHOCARDIOGRAM (N/A)  1. CV- NSR continue Lopressor 2. Pulm- no acute issues, encouraged use of IS 3. Renal- CKD, creatinine stable remains volume overloaded on lasix 4. DM- CBGs controlled, SS coverage ordered, Pre-diabetic on preop A1c 5. Dispo- patient stable, remains volume overloaded will diurese, d/c EPW   LOS: 9 days    BARRETT, Tammy Suarez 11/05/2013   Chart reviewed, patient examined, agree with above. She is doing well POD 3.  Continue diuresis, ambulation. Probably home Tuesday.

## 2013-11-05 NOTE — Plan of Care (Signed)
Problem: Discharge Progression Outcomes Goal: Other Discharge Outcomes/Goals Outcome: Completed/Met Date Met:  11/05/13 Video #127 with family

## 2013-11-05 NOTE — Progress Notes (Signed)
Patient ambulated with a RW 150 ft. Patient tolerated well and returned to bed. Removed EPW per MD prder per hospital policy. Pacing wires intact upon removal. Patient tolerated well. VSS. Patient reminded to remain in bed for 1 hour. Will continue to monitor closely. Glade Nurse, RN

## 2013-11-05 NOTE — Discharge Summary (Signed)
Physician Discharge Summary  Patient ID: Tammy Suarez MRN: [id number] DOB/AGE: 1945-12-24 67 y.o.  Admit date: 10/27/2013 Discharge date: 11/06/2013  Admission Diagnoses:  Patient Active Problem List   Diagnosis Date Noted  . NSTEMI (non-ST elevated myocardial infarction) 10/28/2013  . CKD (chronic kidney disease)   . DM (diabetes mellitus), type 2 with renal complications 99991111  . Recurrent falls 07/28/2013  . Urinary incontinence 03/23/2013  . Routine general medical examination at a health care facility 11/24/2012  . Nicotine dependence 11/24/2012  . Bruit 08/08/2012  . Head trauma 08/08/2012  . Ulcer 02/04/2012  . Breast pain 06/21/2011  . HYPOTHYROIDISM 09/28/2010  . ANXIETY STATE, UNSPECIFIED 09/28/2010  . DEPRESSION 09/28/2010  . FATIGUE 02/08/2009  . VITAMIN B12 DEFICIENCY 12/09/2007  . HYPERLIPIDEMIA 12/09/2007  . OBESITY, UNSPECIFIED 12/09/2007  . ESSENTIAL HYPERTENSION, BENIGN 12/09/2007  . GERD 12/09/2007  . BACK PAIN, CHRONIC 12/09/2007   Discharge Diagnoses:   Patient Active Problem List   Diagnosis Date Noted  . S/P CABG x 3 11/02/2013  . NSTEMI (non-ST elevated myocardial infarction) 10/28/2013  . CKD (chronic kidney disease)   . DM (diabetes mellitus), type 2 with renal complications 99991111  . Recurrent falls 07/28/2013  . Urinary incontinence 03/23/2013  . Routine general medical examination at a health care facility 11/24/2012  . Nicotine dependence 11/24/2012  . Bruit 08/08/2012  . Head trauma 08/08/2012  . Ulcer 02/04/2012  . Breast pain 06/21/2011  . HYPOTHYROIDISM 09/28/2010  . ANXIETY STATE, UNSPECIFIED 09/28/2010  . DEPRESSION 09/28/2010  . FATIGUE 02/08/2009  . VITAMIN B12 DEFICIENCY 12/09/2007  . HYPERLIPIDEMIA 12/09/2007  . OBESITY, UNSPECIFIED 12/09/2007  . ESSENTIAL HYPERTENSION, BENIGN 12/09/2007  . GERD 12/09/2007  . BACK PAIN, CHRONIC 12/09/2007   Discharged Condition: good  History of Present Illness:   Ms.  Tammy Suarez is a 67 yo obese white female who is S/P Gastric Banding from 4 years ago.  She has known history of Nicotine abuse, Diabetes, HTN, Hyperlipidemia, and CKD.  The patient had been in her usual state of health until 3 days prior to admission when she began having intermittent left sided chest pain radiating into the left jaw and left arm associated with exertional dyspnea. This pain was mostly in bed at night and not exertionally related. She also reports numbness in the left arm intermittently for 3 weeks. She presented to the Emergency Department where workup showed positive cardiac enzymes and EKG consistent with a NSTEMI.  She was admitted for further workup.   Hospital Course:   Upon admission she was placed on Heparin and Nitroglycerin drips.  She remained chest pain free.  She was taken for cardiac catheterization on 10/30/2013 which showed severe 3 vessel CAD. Being the patient is a diabetic it was felt coronary bypass would be her best treatment option and TCTS was consulted.  She was evaluated by Dr. Cyndia Suarez who was in agreement that she would be a surgical candidate for bypass surgery.  The risks and benefits of the procedure were explained to the patient and she was agreeable to proceed.  She was taken to the operating room on 11/02/2013.  She underwent CABG x 3 utilizing LIMA to LAD, SVG to OM, and SVG to PDA.  She also underwent Endoscopic saphenous vein harvest from the right thigh.  She tolerated the procedure well and was taken to the SICU in stable condition.  The patient was extubated very early the morning after surgery.  During her stay in the SICU she was  weaned off all cardiac drips as tolerated.  Her chest tubes and arterial lines were removed without difficulty.  She was maintaining NSR and was transferred to the step down unit in stable condition.  The patient continues to progress.  She has not had any cardiac arrythmia and her pacing wires were removed without difficulty. Her  creatinine is at her baseline level and she continues to have good urinary output.  She is ambulating without difficulty.  She is tolerating a diabetic diet.  Patient has a preop A1c of 6.2 which would make her pre-diabetic.  This will need monitored by her Primary care physician.  She is medically stable at this time.  Should no further issues arise we anticipate discharge home in the next 24-48 hours.  She will follow up with Dr. Cyndia Suarez in 3 weeks with a CXR prior to his appointment.  She will also need to follow up with Dr. Percival Suarez in 2- 4 weeks.   Significant Diagnostic Studies: angiography:   Hemodynamics:  AO 138/62  LV 134/9  Coronary angiography:  Coronary dominance: Right  Left mainstem: Mild luminal irregularities.  Left anterior descending (LAD): Small vessel after D1. Mid 80% after the D1. The D1 is a large vessel that splits at the ostium. The superior branch has long 25%. There is moderate calcification in the proximal and mid vessel.  Left circumflex (LCx): Prox occlusion. MOM is incompletely filled via LAD collaterals and appears to be a moderate sized vessel.  Right coronary artery (RCA): Very large dominant vessel. There are two focal heavily calcified segments in the proximal vessel with probable high grade stenosis (80%).  Left ventriculography: Left ventricle was not injected secondary to CKD. We did cross for pressures however.   Treatments: surgery:   1. Median Sternotomy 2. Extracorporeal circulation       3. Coronary artery bypass grafting x 3  Left internal mammary graft to the LAD  SVG to OM  SVG to PDA       4. Endoscopic vein harvest from the right leg  Disposition: 01-Home or Self Care  Discharge Medications:     Medication List    STOP taking these medications       amLODipine 5 MG tablet  Commonly known as:  NORVASC     benazepril 20 MG tablet  Commonly known as:  LOTENSIN     naproxen 500 MG tablet  Commonly known as:  NAPROSYN      TAKE  these medications       amitriptyline 25 MG tablet  Commonly known as:  ELAVIL  Take 25 mg by mouth at bedtime.     aspirin 325 MG EC tablet  Take 1 tablet (325 mg total) by mouth daily.     buPROPion 300 MG 24 hr tablet  Commonly known as:  WELLBUTRIN XL  Take 1 tablet (300 mg total) by mouth every morning.     clonazePAM 1 MG tablet  Commonly known as:  KLONOPIN  Take 1 mg by mouth 3 (three) times daily as needed for anxiety.     DULoxetine 60 MG capsule  Commonly known as:  CYMBALTA  Take 60 mg by mouth 2 (two) times daily.     FISH OIL PO  Take 1 capsule by mouth daily.     furosemide 40 MG tablet  Commonly known as:  LASIX  Take 1 tablet (40 mg total) by mouth daily. For 4 days then stop.     HYDROcodone-acetaminophen 10-325 MG  per tablet  Commonly known as:  NORCO  Take 1 tablet by mouth every 4 (four) hours as needed for severe pain.     levothyroxine 50 MCG tablet  Commonly known as:  SYNTHROID, LEVOTHROID  Take 1 tablet (50 mcg total) by mouth daily.     loratadine 10 MG tablet  Commonly known as:  CLARITIN  Take 10 mg by mouth daily as needed for allergies.     metoprolol tartrate 25 MG tablet  Commonly known as:  LOPRESSOR  Take 0.5 tablets (12.5 mg total) by mouth 2 (two) times daily.     potassium chloride SA 20 MEQ tablet  Commonly known as:  K-DUR,KLOR-CON  Take 1 tablet (20 mEq total) by mouth daily. For 4 days then stop.     simvastatin 20 MG tablet  Commonly known as:  ZOCOR  TAKE 1 TABLET (20 MG TOTAL) BY MOUTH AT BEDTIME.     solifenacin 10 MG tablet  Commonly known as:  VESICARE  Take 10 mg by mouth daily.       The patient has been discharged on:   1.Beta Blocker:  Yes [x   ]                              No   [   ]                              If No, reason:  2.Ace Inhibitor/ARB: Yes [   ]                                     No  [  x  ]                                     If No, reason: CKD, labile blood pressure  3.Statin:    Yes [ x  ]                  No  [   ]                  If No, reason:  4.Ecasa:  Yes  [ x  ]                  No   [   ]                  If No, reason:      Future Appointments Provider Department Dept Phone   11/24/2013 1:20 PM Arnoldo Lenis, MD [facility] [place] [phone]   11/29/2013 10:00 AM Gaye Pollack, MD [facility] [place] [phone]   12/29/2013 10:00 AM Fayrene Helper, MD [facility] [phone]      Follow-up Information   Follow up with Gaye Pollack, MD On 11/29/2013. (Appointment time is at 10:00 am)    Specialty:  Cardiothoracic Surgery   Contact information:   [address] [place] [address] [phone]       Follow up with [facility] On 11/29/2013. (Appointment time is at 9:00 am)    Contact information:   [place]  Follow up with Arnoldo Lenis, MD On 11/24/2013. (Appointment is at 1:20)    Specialty:  Cardiology   Contact information:   [address] [place] [address] [phone]       Follow up with Tula Nakayama, MD On 12/29/2013. (Appointment time is 10:00 am.Follow up is for further surveilllance of HGA1C 6.2)    Specialty:  Family Medicine   Contact information:   [address], [place]  [address] [phone]       Signed: Nani Skillern PA-C 11/06/2013, 11:49 AM

## 2013-11-06 LAB — GLUCOSE, CAPILLARY

## 2013-11-06 MED ORDER — LEVALBUTEROL HCL 0.63 MG/3ML IN NEBU
0.6300 mg | INHALATION_SOLUTION | Freq: Once | RESPIRATORY_TRACT | Status: DC
  Filled 2013-11-06: qty 3

## 2013-11-06 MED ORDER — POTASSIUM CHLORIDE CRYS ER 20 MEQ PO TBCR: 20.0000 meq | EXTENDED_RELEASE_TABLET | Freq: Every day | ORAL | Status: DC

## 2013-11-06 MED ORDER — HYDROCODONE-ACETAMINOPHEN 10-325 MG PO TABS: 1.0000 | ORAL_TABLET | ORAL | Status: DC | PRN

## 2013-11-06 MED ORDER — ACETAMINOPHEN 325 MG PO TABS
325.0000 mg | ORAL_TABLET | Freq: Four times a day (QID) | ORAL | Status: DC | PRN
  Administered 2013-11-06: 650 mg via ORAL
  Filled 2013-11-06: qty 2

## 2013-11-06 MED ORDER — METOPROLOL TARTRATE 25 MG PO TABS: 12.5000 mg | ORAL_TABLET | Freq: Two times a day (BID) | ORAL | Status: DC

## 2013-11-06 MED ORDER — SIMVASTATIN 20 MG PO TABS: ORAL_TABLET | ORAL | Status: DC

## 2013-11-06 MED ORDER — FUROSEMIDE 40 MG PO TABS: 40.0000 mg | ORAL_TABLET | Freq: Every day | ORAL | Status: DC

## 2013-11-06 MED ORDER — ASPIRIN 325 MG PO TBEC: 325.0000 mg | DELAYED_RELEASE_TABLET | Freq: Every day | ORAL | Status: DC

## 2013-11-06 MED FILL — Magnesium Sulfate Inj 50%: INTRAMUSCULAR | Qty: 10 | Status: AC

## 2013-11-06 MED FILL — Heparin Sodium (Porcine) Inj 1000 Unit/ML: INTRAMUSCULAR | Qty: 30 | Status: AC

## 2013-11-06 MED FILL — Potassium Chloride Inj 2 mEq/ML: INTRAVENOUS | Qty: 40 | Status: AC

## 2013-11-06 MED FILL — Dexmedetomidine HCl IV Soln 200 MCG/2ML: INTRAVENOUS | Qty: 2 | Status: AC

## 2013-11-06 NOTE — Progress Notes (Signed)
Chest tube sutures removed; steri strips and benzoin applied; painted chest incision with iodine swabstick.  Suarez, Tammy Dolly

## 2013-11-06 NOTE — Progress Notes (Signed)
DC IV and tele per MD orders/protocol; discharge instructions reviewed with patient and daughter; encouraged patient to continue walking at home and working on deep breathing exercises; no further questions at this time.  BARNETT, Marsh Dolly

## 2013-11-06 NOTE — Progress Notes (Signed)
CARDIAC REHAB PHASE I   PRE:  Rate/Rhythm: 82SR  BP:  Supine: 142/75  Sitting:   Standing:    SaO2: 96%RA  1002-1104 Came to walk with pt and do education. Pt sitting on side of bed. Went to get dinampp as BP cuff not in room. Upon return pt lying in bed because she was tired from bath. Told pt I could do ed first and let her rest and then do walk to see how she tolerated it. Education completed. Pt stated not diabetic now since weight lost but encouraged watching carbs. Encouraged activity and IS. Smoking cessation discussed and handouts given. Pt going to quit cold [place]. Encouraged her to call [phone] if needed for coaching. Pt getting rolling walker and BSC for home use. Offered CRP 2 but pt declined since there are no programs close to her. Stated she has watched post op video. Offered to walk after ed but pt stated she has headache and needs to rest.           Graylon Good, RN BSN  11/06/2013 11:01 AM

## 2013-11-06 NOTE — Progress Notes (Addendum)
      [address]       [place] [address]             [phone]        4 Days Post-Op Procedure(s) (LRB): CORONARY ARTERY BYPASS GRAFTING (CABG) (N/A) INTRAOPERATIVE TRANSESOPHAGEAL ECHOCARDIOGRAM (N/A)  Subjective: Patients states she does not feel all that well, but feels like she would feel better if she could go home.  Objective: Vital signs in last 24 hours: Temp:  [97.6 F (36.4 C)-98.3 F (36.8 C)] 97.6 F (36.4 C) (12/22 0447) Pulse Rate:  [68-74] 69 (12/22 0447) Cardiac Rhythm:  [-] Normal sinus rhythm (12/21 1920) Resp:  [18-19] 18 (12/22 0447) BP: (120-141)/(60-81) 125/65 mmHg (12/22 0447) SpO2:  [90 %-96 %] 93 % (12/22 0447) Weight:  [97.659 kg (215 lb 4.8 oz)] 97.659 kg (215 lb 4.8 oz) (12/22 0749)  Pre op weight  95 kg Current Weight  11/06/13 97.659 kg (215 lb 4.8 oz)      Intake/Output from previous day: 12/21 0701 - 12/22 0700 In: 1800 [P.O.:1800] Out: 2100 [Urine:2100]   Physical Exam:  Cardiovascular: RRR, no murmurs, gallops, or rubs. Pulmonary: Clear to auscultation bilaterally; no rales, wheezes, or rhonchi. Abdomen: Soft, non tender, bowel sounds present. Extremities: Mild bilateral lower extremity edema. Wounds: Clean and dry.  No erythema or signs of infection.  Lab Results: CBC: Recent Labs  11/04/13 0522 11/05/13 0430  WBC 5.8 6.5  HGB 9.3* 10.5*  HCT 27.6* 31.7*  PLT 150 173   BMET:  Recent Labs  11/04/13 0418 11/05/13 0430  NA 132* 133*  K 4.3 4.4  CL 97 93*  CO2 29 31  GLUCOSE 109* 96  BUN 22 22  CREATININE 1.48* 1.40*  CALCIUM 8.2* 9.4    PT/INR:  Lab Results  Component Value Date   INR 1.24 11/02/2013   INR 1.11 10/28/2013   INR 1.04 10/27/2013   ABG:  INR: Will add last result for INR, ABG once components are confirmed Will add last 4 CBG results once components are confirmed  Assessment/Plan:  1. CV - SR. On Lopressor 12.5 bid. 2.  Pulmonary - Encourage incentive  spirometer 3. Volume Overload - Continue with Lasix 40 daily 4.  Acute blood loss anemia - Last H and H up to 10.5 and 31.7 5.CKD-last creatinine stable at 1.4 6. Possible discharge later today  ZIMMERMAN,DONIELLE MPA-C 11/06/2013,8:44 AM  Patient seen and examined, agree with above. Home later today

## 2013-11-07 ENCOUNTER — Encounter (HOSPITAL_COMMUNITY): Payer: Self-pay | Admitting: Surgery

## 2013-11-12 ENCOUNTER — Telehealth: Payer: Self-pay | Admitting: Family Medicine

## 2013-11-12 DIAGNOSIS — I10 Essential (primary) hypertension: Secondary | ICD-10-CM

## 2013-11-12 DIAGNOSIS — E785 Hyperlipidemia, unspecified: Secondary | ICD-10-CM

## 2013-11-12 NOTE — Telephone Encounter (Signed)
I made a f/u hospital call today. Pt needsNEW lab order for her Feb visit. Shew will only need fasting lipid and chem 7 , pls mail this to her, she is to cancel previous order (had a lot of labs done in hospital) ensure she understands , so also call to let her  know order is in mail, highlight note at bottom of order to discard prvious lab request and call with questions pls

## 2013-11-13 NOTE — Addendum Note (Signed)
Addended by: Eual Fines on: 11/13/2013 09:23 AM   Modules accepted: Orders

## 2013-11-13 NOTE — Telephone Encounter (Signed)
Patient understands to discard previous lab order and I have mailed her a new order and she is expecting it

## 2013-11-24 ENCOUNTER — Encounter: Payer: Medicare Other | Admitting: Cardiology

## 2013-11-24 ENCOUNTER — Encounter: Payer: Self-pay | Admitting: Adult Health

## 2013-11-24 ENCOUNTER — Ambulatory Visit (INDEPENDENT_AMBULATORY_CARE_PROVIDER_SITE_OTHER): Payer: Medicare Other | Admitting: Adult Health

## 2013-11-24 VITALS — BP 124/78 | HR 99 | Ht 65.0 in | Wt 205.0 lb

## 2013-11-24 DIAGNOSIS — F17200 Nicotine dependence, unspecified, uncomplicated: Secondary | ICD-10-CM

## 2013-11-24 DIAGNOSIS — E785 Hyperlipidemia, unspecified: Secondary | ICD-10-CM | POA: Diagnosis not present

## 2013-11-24 DIAGNOSIS — Z951 Presence of aortocoronary bypass graft: Secondary | ICD-10-CM

## 2013-11-24 NOTE — Assessment & Plan Note (Signed)
She is doing well with some complaints of mild dizziness, Orthostatics are negative. Will not make any adjustments in medications at this time. I will refer to cardiac rehab for further education and exercise.  She will see Dr. Harl Bowie in 3 months.

## 2013-11-24 NOTE — Assessment & Plan Note (Signed)
She has stopped smoking. I have advised her to stay away from second hand smoke as well.

## 2013-11-24 NOTE — Patient Instructions (Signed)
Your physician recommends that you schedule a follow-up appointment in: 3 months with Dr Bryna Colander will receive a reminder letter two months in advance reminding you to call and schedule your appointment. If you don't receive this letter, please contact our office.  You have been referred to cardiac rehab

## 2013-11-24 NOTE — Progress Notes (Signed)
Name: Tammy Suarez    DOB: 12-Nov-1946  Age: 68 y.o.  MR#: [id number]       PCP:  Tula Nakayama, MD      Insurance: Payor: MEDICARE / Plan: MEDICARE PART A AND B / Product Type: *No Product type* /   CC:    Chief Complaint  Patient presents with  . Coronary Artery Disease    S/P CABG  . Hypertension    VS Filed Vitals:   11/24/13 1316  BP: 124/78  Pulse: 99  Height: 5\' 5"  (1.651 m)  Weight: 205 lb (92.987 kg)    Weights Current Weight  11/24/13 205 lb (92.987 kg)  11/06/13 215 lb 4.8 oz (97.659 kg)  11/06/13 215 lb 4.8 oz (97.659 kg)    Blood Pressure  BP Readings from Last 3 Encounters:  11/24/13 124/78  11/06/13 125/65  11/06/13 125/65     Admit date:  (Not on file) Last encounter with RMR:  Visit date not found   Allergy Meperidine hcl; Niacin; and Propoxyphene n-acetaminophen  Current Outpatient Prescriptions  Medication Sig Dispense Refill  . amitriptyline (ELAVIL) 25 MG tablet Take 25 mg by mouth at bedtime.      Marland Kitchen aspirin EC 325 MG EC tablet Take 1 tablet (325 mg total) by mouth daily.  30 tablet  0  . buPROPion (WELLBUTRIN XL) 300 MG 24 hr tablet Take 1 tablet (300 mg total) by mouth every morning.  90 tablet  1  . clonazePAM (KLONOPIN) 1 MG tablet Take 1 mg by mouth 3 (three) times daily as needed for anxiety.      . DULoxetine (CYMBALTA) 60 MG capsule Take 60 mg by mouth 2 (two) times daily.      Marland Kitchen HYDROcodone-acetaminophen (NORCO) 10-325 MG per tablet Take 1 tablet by mouth every 4 (four) hours as needed for severe pain.  30 tablet  0  . levothyroxine (SYNTHROID, LEVOTHROID) 50 MCG tablet Take 1 tablet (50 mcg total) by mouth daily.  30 tablet  3  . loratadine (CLARITIN) 10 MG tablet Take 10 mg by mouth daily as needed for allergies.      . metoprolol tartrate (LOPRESSOR) 25 MG tablet Take 0.5 tablets (12.5 mg total) by mouth 2 (two) times daily.  30 tablet  1  . Omega-3 Fatty Acids (FISH OIL PO) Take 1 capsule by mouth daily.      . simvastatin  (ZOCOR) 20 MG tablet TAKE 1 TABLET (20 MG TOTAL) BY MOUTH AT BEDTIME.  90 tablet  1  . solifenacin (VESICARE) 10 MG tablet Take 10 mg by mouth daily.       No current facility-administered medications for this visit.    Discontinued Meds:    Medications Discontinued During This Encounter  Medication Reason  . furosemide (LASIX) 40 MG tablet Error  . potassium chloride SA (K-DUR,KLOR-CON) 20 MEQ tablet Error    Patient Active Problem List   Diagnosis Date Noted  . S/P CABG x 3 11/02/2013  . NSTEMI (non-ST elevated myocardial infarction) 10/28/2013  . CKD (chronic kidney disease)   . DM (diabetes mellitus), type 2 with renal complications 99991111  . Recurrent falls 07/28/2013  . Urinary incontinence 03/23/2013  . Routine general medical examination at a health care facility 11/24/2012  . Nicotine dependence 11/24/2012  . Bruit 08/08/2012  . Head trauma 08/08/2012  . Ulcer 02/04/2012  . Breast pain 06/21/2011  . HYPOTHYROIDISM 09/28/2010  . ANXIETY STATE, UNSPECIFIED 09/28/2010  . DEPRESSION 09/28/2010  . FATIGUE  02/08/2009  . VITAMIN B12 DEFICIENCY 12/09/2007  . HYPERLIPIDEMIA 12/09/2007  . OBESITY, UNSPECIFIED 12/09/2007  . ESSENTIAL HYPERTENSION, BENIGN 12/09/2007  . GERD 12/09/2007  . BACK PAIN, CHRONIC 12/09/2007    LABS    Component Value Date/Time   NA 133* 11/05/2013 0430   NA 132* 11/04/2013 0418   NA 135 11/03/2013 1600   K 4.4 11/05/2013 0430   K 4.3 11/04/2013 0418   K 4.3 11/03/2013 1600   CL 93* 11/05/2013 0430   CL 97 11/04/2013 0418   CL 96 11/03/2013 1600   CO2 31 11/05/2013 0430   CO2 29 11/04/2013 0418   CO2 23 11/03/2013 0400   GLUCOSE 96 11/05/2013 0430   GLUCOSE 109* 11/04/2013 0418   GLUCOSE 100* 11/03/2013 1600   BUN 22 11/05/2013 0430   BUN 22 11/04/2013 0418   BUN 20 11/03/2013 1600   CREATININE 1.40* 11/05/2013 0430   CREATININE 1.48* 11/04/2013 0418   CREATININE 1.51* 11/03/2013 1616   CREATININE 1.87* 08/29/2013 1004    CREATININE 1.72* 05/10/2013 1145   CREATININE 1.56* 11/24/2012 0905   CALCIUM 9.4 11/05/2013 0430   CALCIUM 8.2* 11/04/2013 0418   CALCIUM 8.2* 11/03/2013 0400   GFRNONAA 38* 11/05/2013 0430   GFRNONAA 35* 11/04/2013 0418   GFRNONAA 35* 11/03/2013 1616   GFRAA 44* 11/05/2013 0430   GFRAA 41* 11/04/2013 0418   GFRAA 40* 11/03/2013 1616   CMP     Component Value Date/Time   NA 133* 11/05/2013 0430   K 4.4 11/05/2013 0430   CL 93* 11/05/2013 0430   CO2 31 11/05/2013 0430   GLUCOSE 96 11/05/2013 0430   BUN 22 11/05/2013 0430   CREATININE 1.40* 11/05/2013 0430   CREATININE 1.87* 08/29/2013 1004   CALCIUM 9.4 11/05/2013 0430   PROT 6.4 10/28/2013 0540   ALBUMIN 3.2* 10/28/2013 0540   AST 21 10/28/2013 0540   ALT 8 10/28/2013 0540   ALKPHOS 53 10/28/2013 0540   BILITOT 0.2* 10/28/2013 0540   GFRNONAA 38* 11/05/2013 0430   GFRAA 44* 11/05/2013 0430       Component Value Date/Time   WBC 6.5 11/05/2013 0430   WBC 5.8 11/04/2013 0522   WBC 2.6* 11/04/2013 0418   HGB 10.5* 11/05/2013 0430   HGB 9.3* 11/04/2013 0522   HGB 16.3* 11/04/2013 0418   HCT 31.7* 11/05/2013 0430   HCT 27.6* 11/04/2013 0522   HCT 47.4* 11/04/2013 0418   MCV 93.5 11/05/2013 0430   MCV 91.7 11/04/2013 0522   MCV 92.8 11/04/2013 0418    Lipid Panel     Component Value Date/Time   CHOL 162 08/29/2013 1004   TRIG 268* 08/29/2013 1004   HDL 33* 08/29/2013 1004   CHOLHDL 4.9 08/29/2013 1004   VLDL 54* 08/29/2013 1004   LDLCALC 75 08/29/2013 1004    ABG    Component Value Date/Time   PHART 7.305* 11/03/2013 0404   PCO2ART 48.0* 11/03/2013 0404   PO2ART 84.0 11/03/2013 0404   HCO3 23.9 11/03/2013 0404   TCO2 26 11/03/2013 1600   ACIDBASEDEF 3.0* 11/03/2013 0404   O2SAT 95.0 11/03/2013 0404     Lab Results  Component Value Date   TSH 2.685 10/28/2013   BNP (last 3 results) No results found for this basename: PROBNP,  in the last 8760 hours Cardiac Panel (last 3 results) No results found  for this basename: CKTOTAL, CKMB, TROPONINI, RELINDX,  in the last 72 hours  Iron/TIBC/Ferritin    Component Value Date/Time  IRON 58 08/08/2012 1208   TIBC 432 01/24/2007 2306   FERRITIN 128 01/24/2007 2306     EKG Orders placed in visit on 11/24/13  . EKG 12-LEAD     Prior Assessment and Plan Problem List as of 11/24/2013     Cardiovascular and Mediastinum   ESSENTIAL HYPERTENSION, BENIGN   Last Assessment & Plan   09/15/2013 Office Visit Written 09/16/2013 10:12 PM by Fayrene Helper, MD     Controlled, no change in medication DASH diet and commitment to daily physical activity for a minimum of 30 minutes discussed and encouraged, as a part of hypertension management. The importance of attaining a healthy weight is also discussed.     NSTEMI (non-ST elevated myocardial infarction)     Digestive   GERD   Last Assessment & Plan   03/23/2013 Office Visit Written 03/25/2013 11:03 AM by Fayrene Helper, MD     Controlled, no change in medication       Endocrine   HYPOTHYROIDISM   Last Assessment & Plan   09/15/2013 Office Visit Written 09/16/2013 10:13 PM by Fayrene Helper, MD     Controlled, no change in medication     DM (diabetes mellitus), type 2 with renal complications   Last Assessment & Plan   09/15/2013 Office Visit Written 09/16/2013 10:12 PM by Fayrene Helper, MD     Deteriorated, pt to remain diet controlled only, and is encouraged strongly to reduce carb and sweet intake alos to commit to weight loss and physical activity      Musculoskeletal and Integument   Ulcer   Last Assessment & Plan   03/02/2012 Office Visit Written 03/06/2012 12:17 AM by Fayrene Helper, MD     Recurrent skin breakdown along lower abdominal fold, refer to wound center      Genitourinary   CKD (chronic kidney disease)     Other   VITAMIN B12 DEFICIENCY   Last Assessment & Plan   07/28/2013 Office Visit Written 07/30/2013  9:50 AM by Fayrene Helper, MD     rept  level to be checked to determine need for supplement    HYPERLIPIDEMIA   Last Assessment & Plan   09/15/2013 Office Visit Written 09/16/2013 10:14 PM by Fayrene Helper, MD     Triglycerides elevated, dietary change only No med dose change    OBESITY, UNSPECIFIED   Last Assessment & Plan   09/15/2013 Office Visit Written 09/16/2013 10:14 PM by Fayrene Helper, MD     Deteriorated. Patient re-educated about  the importance of commitment to a  minimum of 150 minutes of exercise per week. The importance of healthy food choices with portion control discussed. Encouraged to start a food diary, count calories and to consider  joining a support group. Sample diet sheets offered. Goals set by the patient for the next several months.       ANXIETY STATE, UNSPECIFIED   Last Assessment & Plan   03/23/2013 Office Visit Written 03/25/2013 11:06 AM by Fayrene Helper, MD     C/o stress with adult children, not out of control,no interest in therapy    DEPRESSION   Last Assessment & Plan   08/08/2012 Office Visit Written 08/08/2012  2:09 PM by Fayrene Helper, MD     Controlled, no change in medication     BACK PAIN, CHRONIC   Last Assessment & Plan   09/15/2013 Office Visit Written 09/16/2013 10:13 PM by Fayrene Helper,  MD     Unchanged, medication unchanged    FATIGUE   Breast pain   Last Assessment & Plan   06/19/2011 Office Visit Written 06/21/2011  4:23 PM by Fayrene Helper, MD     New left brest pain , will refer for diagnostic mammogram    Bruit   Last Assessment & Plan   08/08/2012 Office Visit Written 08/08/2012  2:06 PM by Fayrene Helper, MD     Recurrent light headedness and bruit, doppler study to further eval arteries    Head trauma   Last Assessment & Plan   08/08/2012 Office Visit Written 08/08/2012  2:05 PM by Fayrene Helper, MD     Recent head trauma with periorbital bruising and pain, ct scan of head    Routine general medical examination at a health  care facility   Last Assessment & Plan   11/24/2012 Office Visit Written 12/18/2012 10:36 AM by Fayrene Helper, MD     Annual wellness completed as documented. Pt encouraged to work hard on safety, increased physical activity and weight loss. Zostavax still needed, she will get based on the cost    Nicotine dependence   Last Assessment & Plan   09/15/2013 Office Visit Written 09/16/2013 10:14 PM by Fayrene Helper, MD     Ongoing nicotine use Patient counseled for approximately 5 minutes regarding the health risks of ongoing nicotine use, specifically all types of cancer, heart disease, stroke and respiratory failure. The options available for help with cessation ,the behavioral changes to assist the process, and the option to either gradully reduce usage  Or abruptly stop.is also discussed. Pt is also encouraged to set specific goals in number of cigarettes used daily, as well as to set a quit date.     Urinary incontinence   Last Assessment & Plan   09/15/2013 Office Visit Written 09/16/2013 10:16 PM by Fayrene Helper, MD     Reports good response to vesicsare 10mg  continue same    Recurrent falls   Last Assessment & Plan   07/28/2013 Office Visit Written 07/30/2013  9:50 AM by Fayrene Helper, MD     Refer to PT for eval and treatment. D/c anti hypertensive    S/P CABG x 3       Imaging: Dg Chest Port 1 View  11/04/2013   CLINICAL DATA:  Postop cardiac surgery.  EXAM: PORTABLE CHEST - 1 VIEW  COMPARISON:  11/03/2013  FINDINGS: Prior CABG. Cardiomegaly with vascular congestion. Interval removal of left chest tube. No pneumothorax. No confluent opacities or visible significant effusions.  IMPRESSION: Interval removal of left chest tube.  No pneumothorax.  Cardiomegaly, vascular congestion   Electronically Signed   By: Rolm Baptise M.D.   On: 11/04/2013 07:23   Dg Chest Portable 1 View In Am  11/03/2013   CLINICAL DATA:  Postoperative evaluation.  EXAM: PORTABLE CHEST - 1  VIEW  COMPARISON:  11/02/2013  FINDINGS: Cardiac shadow is stable. The endotracheal tube and nasogastric catheter been removed. A mediastinal drain and left thoracostomy catheter remain in satisfactory position. No pneumothorax is noted. A Swan-Ganz catheter is noted in the pulmonary outflow tract. No focal infiltrate is noted.  IMPRESSION: No acute abnormality noted.   Electronically Signed   By: Inez Catalina M.D.   On: 11/03/2013 07:49   Dg Chest Portable 1 View  11/02/2013   CLINICAL DATA:  Postop evaluation.  EXAM: PORTABLE CHEST - 1 VIEW  COMPARISON:  10/27/2013  FINDINGS: Endotracheal tube is 3.4 cm above the carina. There are 3 chest drains in place. Nasogastric tube is near the GE junction. Swan-Ganz catheter in the region of the right lower lobar artery. Negative for a pneumothorax. No focal lung disease. Heart size is within normal limits. Evidence for a lap band in the abdomen. Difficult to evaluate the lap band position on this examination.  IMPRESSION: Postoperative changes as described.  Negative for pneumothorax.   Electronically Signed   By: Markus Daft M.D.   On: 11/02/2013 13:53   Dg Chest Portable 1 View  10/27/2013   CLINICAL DATA:  Chest pain for 3 days. Nausea and shortness of breath.  EXAM: PORTABLE CHEST - 1 VIEW  COMPARISON:  Chest radiograph performed 01/17/2005  FINDINGS: The lungs are well-aerated. Pulmonary vascularity is at the upper limits of normal. Mild bibasilar opacities likely reflect atelectasis. There is no evidence of pleural effusion or pneumothorax.  The cardiomediastinal silhouette is within normal limits. No acute osseous abnormalities are seen.  IMPRESSION: Mild bibasilar opacities likely reflect atelectasis; lungs otherwise clear.   Electronically Signed   By: Garald Balding M.D.   On: 10/27/2013 23:41

## 2013-11-24 NOTE — Progress Notes (Signed)
HPI: Tammy Suarez is a 68 year old patient of Dr. Warren Lacy we are following for ongoing assessment and management of CAD. She is status post coronary artery bypass grafting December of 2014 with LIMA to LAD, SVG to OM and SVG to PDA.    This was completed after the patient presented to [facility] hospital with non-ST elevation MI, underwent cardiac catheterization per Dr. Warren Lacy demonstrated multivessel CAD the patient was discharged on 11/06/2013 was continued on aspirin, Lasix, metoprolol, potassium, and statin. She is here to be est. in our office and will be followed by Dr. Harl Bowie.     She is sore and has some complaints of positional dizziness,  but otherwise ok. She is medically complaint.  Allergies  Allergen Reactions  . Meperidine Hcl Nausea And Vomiting  . Niacin Other (See Comments)    REACTION: face peeling and burning  . Propoxyphene N-Acetaminophen Nausea And Vomiting    Current Outpatient Prescriptions  Medication Sig Dispense Refill  . amitriptyline (ELAVIL) 25 MG tablet Take 25 mg by mouth at bedtime.      Marland Kitchen aspirin EC 325 MG EC tablet Take 1 tablet (325 mg total) by mouth daily.  30 tablet  0  . buPROPion (WELLBUTRIN XL) 300 MG 24 hr tablet Take 1 tablet (300 mg total) by mouth every morning.  90 tablet  1  . clonazePAM (KLONOPIN) 1 MG tablet Take 1 mg by mouth 3 (three) times daily as needed for anxiety.      . DULoxetine (CYMBALTA) 60 MG capsule Take 60 mg by mouth 2 (two) times daily.      Marland Kitchen HYDROcodone-acetaminophen (NORCO) 10-325 MG per tablet Take 1 tablet by mouth every 4 (four) hours as needed for severe pain.  30 tablet  0  . levothyroxine (SYNTHROID, LEVOTHROID) 50 MCG tablet Take 1 tablet (50 mcg total) by mouth daily.  30 tablet  3  . loratadine (CLARITIN) 10 MG tablet Take 10 mg by mouth daily as needed for allergies.      . metoprolol tartrate (LOPRESSOR) 25 MG tablet Take 0.5 tablets (12.5 mg total) by mouth 2 (two) times daily.  30 tablet  1  . Omega-3 Fatty  Acids (FISH OIL PO) Take 1 capsule by mouth daily.      . simvastatin (ZOCOR) 20 MG tablet TAKE 1 TABLET (20 MG TOTAL) BY MOUTH AT BEDTIME.  90 tablet  1  . solifenacin (VESICARE) 10 MG tablet Take 10 mg by mouth daily.       No current facility-administered medications for this visit.    Past Medical History  Diagnosis Date  . Vitamin B 12 deficiency   . Chronic back pain   . GERD (gastroesophageal reflux disease)   . Nicotine addiction   . Hyperlipidemia   . Obesity   . Hypertension   . Type 2 diabetes mellitus without complications   . Conjunctivitis, acute   . CKD (chronic kidney disease)     Past Surgical History  Procedure Laterality Date  . Cervical laminectomy    . Cholecystectomy    . Inguinal herniorrhaphy    . Vesicovaginal fistula closure w/ tah    . Lumbar fusion    . Left index finger repair    . Laparoscopic gastric banding  2009  . Cosmetic surgery  07/01/2012    recurrent skin infection on lower abdomen, [facility]  . Abdominal hysterectomy    . Spine surgery    . Coronary artery bypass graft N/A 11/02/2013  Procedure: CORONARY ARTERY BYPASS GRAFTING (CABG);  Surgeon: Gaye Pollack, MD;  Location: [facility];  Service: Open Heart Surgery;  Laterality: N/A;  CABG x three, using left internal mammary artery and right leg greater saphenous vein harvested endoscopically  . Intraoperative transesophageal echocardiogram N/A 11/02/2013    Procedure: INTRAOPERATIVE TRANSESOPHAGEAL ECHOCARDIOGRAM;  Surgeon: Gaye Pollack, MD;  Location: [facility] OR;  Service: Open Heart Surgery;  Laterality: N/A;    [id number] of systems complete and found to be negative unless listed above  PHYSICAL EXAM BP 124/78  Pulse 99  Ht 5\' 5"  (1.651 m)  Wt 205 lb (92.987 kg)  BMI 34.11 kg/m2  General: Well developed, well nourished, in no acute distress Head: Eyes PERRLA, No xanthomas.   Normal cephalic and atramatic  Lungs: Clear bilaterally to auscultation and percussion. Heart: HRRR S1 S2,  tachycardic.soft systolic murmur. Pulses are 2+ & equal.            No carotid bruit. No JVD.  No abdominal bruits. No femoral bruits. Abdomen: Bowel sounds are positive, abdomen soft and non-tender without masses or                  Hernia's noted. Msk:  Back normal, normal gait. Normal strength and tone for age. Sternotomy incision is well healed.  Extremities: No clubbing, cyanosis or edema.  DP +1 Neuro: Alert and oriented X 3. Psych:  Good affect, responds appropriately  EKG: NSR rate of 96 bpm. Anterior changes.  ASSESSMENT AND PLAN

## 2013-11-24 NOTE — Assessment & Plan Note (Addendum)
Blood pressure is well controlled. She is not orthostatic. Remains on metoprolol only. EF is normal.

## 2013-11-24 NOTE — Assessment & Plan Note (Signed)
Continue statin therapy. Low cholesterol diet. Weight loss.

## 2013-11-27 ENCOUNTER — Other Ambulatory Visit: Payer: Self-pay | Admitting: *Deleted

## 2013-11-27 DIAGNOSIS — I2510 Atherosclerotic heart disease of native coronary artery without angina pectoris: Secondary | ICD-10-CM

## 2013-11-29 ENCOUNTER — Ambulatory Visit: Payer: Self-pay | Admitting: Surgery

## 2013-12-01 DIAGNOSIS — I10 Essential (primary) hypertension: Secondary | ICD-10-CM | POA: Diagnosis not present

## 2013-12-01 DIAGNOSIS — E785 Hyperlipidemia, unspecified: Secondary | ICD-10-CM | POA: Diagnosis not present

## 2013-12-02 LAB — LIPID PANEL
Cholesterol: 178 mg/dL (ref 0–200)
HDL: 35 mg/dL — ABNORMAL LOW (ref >39)
LDL Cholesterol: 89 mg/dL (ref 0–99)
Total CHOL/HDL Ratio: 5.1 Ratio
Triglycerides: 272 mg/dL — ABNORMAL HIGH (ref <150)
VLDL: 54 mg/dL — ABNORMAL HIGH (ref 0–40)

## 2013-12-02 LAB — BASIC METABOLIC PANEL
BUN: 37 mg/dL — ABNORMAL HIGH (ref 6–23)
CO2: 24 mEq/L (ref 19–32)
Calcium: 9.6 mg/dL (ref 8.4–10.5)
Chloride: 97 mEq/L (ref 96–112)
Creat: 1.82 mg/dL — ABNORMAL HIGH (ref 0.50–1.10)
Glucose, Bld: 120 mg/dL — ABNORMAL HIGH (ref 70–99)
Potassium: 5.5 mEq/L — ABNORMAL HIGH (ref 3.5–5.3)
Sodium: 130 mEq/L — ABNORMAL LOW (ref 135–145)

## 2013-12-06 ENCOUNTER — Ambulatory Visit
Admission: RE | Admit: 2013-12-06 | Discharge: 2013-12-06 | Disposition: A | Discharge status: Home / Self Care | Payer: Medicare Other | Source: Ambulatory Visit | Attending: Surgery | Admitting: Surgery

## 2013-12-06 ENCOUNTER — Encounter: Payer: Self-pay | Admitting: Surgery

## 2013-12-06 ENCOUNTER — Ambulatory Visit (INDEPENDENT_AMBULATORY_CARE_PROVIDER_SITE_OTHER): Payer: Medicare Other | Admitting: Surgery

## 2013-12-06 VITALS — BP 123/80 | HR 101 | Resp 16 | Ht 65.0 in | Wt 205.0 lb

## 2013-12-06 DIAGNOSIS — Z951 Presence of aortocoronary bypass graft: Secondary | ICD-10-CM | POA: Diagnosis not present

## 2013-12-06 DIAGNOSIS — I2510 Atherosclerotic heart disease of native coronary artery without angina pectoris: Secondary | ICD-10-CM

## 2013-12-07 ENCOUNTER — Encounter: Payer: Self-pay | Admitting: Surgery

## 2013-12-08 ENCOUNTER — Other Ambulatory Visit: Payer: Self-pay

## 2013-12-08 MED ORDER — HYDROCODONE-ACETAMINOPHEN 10-325 MG PO TABS: 1.0000 | ORAL_TABLET | Freq: Four times a day (QID) | ORAL | Status: DC

## 2013-12-09 ENCOUNTER — Encounter: Payer: Self-pay | Admitting: Surgery

## 2013-12-09 NOTE — Progress Notes (Signed)
HPI:  Patient returns for routine postoperative follow-up having undergone CABG x 3 on 11/02/2013. The patient's early postoperative recovery while in the hospital was notable for a slow postop course due to morbid obesity. Since hospital discharge the patient reports that she has continued to improve. She still has some dyspnea with exertion but no chest discomfort.   Current Outpatient Prescriptions  Medication Sig Dispense Refill  . amitriptyline (ELAVIL) 25 MG tablet Take 25 mg by mouth at bedtime.      Marland Kitchen aspirin EC 325 MG EC tablet Take 1 tablet (325 mg total) by mouth daily.  30 tablet  0  . buPROPion (WELLBUTRIN XL) 300 MG 24 hr tablet Take 1 tablet (300 mg total) by mouth every morning.  90 tablet  1  . DULoxetine (CYMBALTA) 60 MG capsule Take 60 mg by mouth 2 (two) times daily.      Marland Kitchen HYDROcodone-acetaminophen (NORCO) 10-325 MG per tablet Take 1 tablet by mouth every 4 (four) hours as needed for severe pain.  30 tablet  0  . levothyroxine (SYNTHROID, LEVOTHROID) 50 MCG tablet Take 1 tablet (50 mcg total) by mouth daily.  30 tablet  3  . loratadine (CLARITIN) 10 MG tablet Take 10 mg by mouth daily as needed for allergies.      . metoprolol tartrate (LOPRESSOR) 25 MG tablet Take 0.5 tablets (12.5 mg total) by mouth 2 (two) times daily.  30 tablet  1  . Omega-3 Fatty Acids (FISH OIL PO) Take 1 capsule by mouth daily.      . simvastatin (ZOCOR) 20 MG tablet TAKE 1 TABLET (20 MG TOTAL) BY MOUTH AT BEDTIME.  90 tablet  1  . solifenacin (VESICARE) 10 MG tablet Take 10 mg by mouth daily.      . clonazePAM (KLONOPIN) 1 MG tablet Take 1 mg by mouth 3 (three) times daily as needed for anxiety.      Marland Kitchen HYDROcodone-acetaminophen (NORCO) 10-325 MG per tablet Take 1 tablet by mouth 4 (four) times daily.  120 tablet  0   No current facility-administered medications for this visit.    Physical Exam: BP 123/80  Pulse 101  Resp 16  Ht 5\' 5"  (1.651 m)  Wt 205 lb (92.987 kg)  BMI 34.11  kg/m2  SpO2 98% She looks well. Lung exam is clear. Cardiac exam shows a regular rate and rhythm with normal heart sounds. Chest incision is healing well and sternum is stable. The leg incisions has opened up and it appears that the subcuticular suture has pulled through her thin skin. There is no drainage or erythema and the would is only 2 cm long. There is minimal edema in the feet and ankles.   Diagnostic Tests:  CLINICAL DATA: Coronary artery disease. CABG.  EXAM:  CHEST 2 VIEW  COMPARISON: 11/04/2013  FINDINGS:  Heart size is normal. Negative for heart failure. Lungs are clear  without infiltrate or effusion. Improved aeration compared with the  prior study.  IMPRESSION:  No active cardiopulmonary disease.  Electronically Signed  By: Franchot Gallo M.D.  On: 12/06/2013 13:03   Impression:  Overall I think she is doing well. I encouraged her to continue walking. She is planning to participate in cardiac rehab. I told her she could drive her car but should not lift anything heavier than 10 lbs for three months postop. I asked her to keep the leg incision covered with dry gauze and it should heal in without difficulty.   Plan:  She will follow-up with cardiology and will return to see me if her leg incision does not heal over the next few weeks or shows any signs of infection.

## 2013-12-18 DIAGNOSIS — E785 Hyperlipidemia, unspecified: Secondary | ICD-10-CM | POA: Diagnosis not present

## 2013-12-18 DIAGNOSIS — E119 Type 2 diabetes mellitus without complications: Secondary | ICD-10-CM | POA: Diagnosis not present

## 2013-12-18 DIAGNOSIS — E039 Hypothyroidism, unspecified: Secondary | ICD-10-CM | POA: Diagnosis not present

## 2013-12-18 LAB — LIPID PANEL
Cholesterol: 171 mg/dL (ref 0–200)
HDL: 28 mg/dL — ABNORMAL LOW (ref >39)
LDL Cholesterol: 81 mg/dL (ref 0–99)
Total CHOL/HDL Ratio: 6.1 Ratio
Triglycerides: 310 mg/dL — ABNORMAL HIGH (ref <150)
VLDL: 62 mg/dL — ABNORMAL HIGH (ref 0–40)

## 2013-12-18 LAB — COMPLETE METABOLIC PANEL WITH GFR
ALT: 10 U/L (ref 0–35)
AST: 16 U/L (ref 0–37)
Albumin: 4 g/dL (ref 3.5–5.2)
Alkaline Phosphatase: 52 U/L (ref 39–117)
BUN: 42 mg/dL — ABNORMAL HIGH (ref 6–23)
CO2: 24 mEq/L (ref 19–32)
Calcium: 9.5 mg/dL (ref 8.4–10.5)
Chloride: 98 mEq/L (ref 96–112)
Creat: 1.74 mg/dL — ABNORMAL HIGH (ref 0.50–1.10)
GFR, Est African American: 34 mL/min — ABNORMAL LOW
GFR, Est Non African American: 30 mL/min — ABNORMAL LOW
Glucose, Bld: 123 mg/dL — ABNORMAL HIGH (ref 70–99)
Potassium: 5.6 mEq/L — ABNORMAL HIGH (ref 3.5–5.3)
Sodium: 132 mEq/L — ABNORMAL LOW (ref 135–145)
Total Bilirubin: 0.3 mg/dL (ref 0.2–1.2)
Total Protein: 6.8 g/dL (ref 6.0–8.3)

## 2013-12-19 ENCOUNTER — Other Ambulatory Visit: Payer: Self-pay

## 2013-12-19 LAB — HEMOGLOBIN A1C
Hgb A1c MFr Bld: 6.4 % — ABNORMAL HIGH
Mean Plasma Glucose: 137 mg/dL — ABNORMAL HIGH

## 2013-12-19 LAB — TSH: TSH: 7.869 u[IU]/mL — ABNORMAL HIGH (ref 0.350–4.500)

## 2013-12-19 MED ORDER — LEVOTHYROXINE SODIUM 75 MCG PO TABS: 75.0000 ug | ORAL_TABLET | Freq: Every day | ORAL | Status: DC

## 2013-12-20 ENCOUNTER — Other Ambulatory Visit: Payer: Self-pay | Admitting: Physician Assistant

## 2013-12-22 ENCOUNTER — Ambulatory Visit (INDEPENDENT_AMBULATORY_CARE_PROVIDER_SITE_OTHER): Payer: Medicare Other | Admitting: Physician Assistant

## 2013-12-22 ENCOUNTER — Other Ambulatory Visit: Payer: Self-pay | Admitting: Surgery

## 2013-12-22 VITALS — BP 140/89 | HR 76 | Temp 97.7°F | Resp 20 | Ht 65.0 in | Wt 205.0 lb

## 2013-12-22 DIAGNOSIS — T148XXA Other injury of unspecified body region, initial encounter: Secondary | ICD-10-CM

## 2013-12-22 DIAGNOSIS — I2510 Atherosclerotic heart disease of native coronary artery without angina pectoris: Secondary | ICD-10-CM

## 2013-12-22 DIAGNOSIS — L089 Local infection of the skin and subcutaneous tissue, unspecified: Secondary | ICD-10-CM

## 2013-12-22 DIAGNOSIS — Z951 Presence of aortocoronary bypass graft: Secondary | ICD-10-CM

## 2013-12-22 DIAGNOSIS — T8189XA Other complications of procedures, not elsewhere classified, initial encounter: Secondary | ICD-10-CM | POA: Diagnosis not present

## 2013-12-22 MED ORDER — CEPHALEXIN 500 MG PO CAPS: 500.0000 mg | ORAL_CAPSULE | Freq: Three times a day (TID) | ORAL | Status: DC

## 2013-12-22 NOTE — Progress Notes (Signed)
Tammy Suarez       [place] [address]             [phone]          HPI: The patient is status post CABG x 3 by Dr. Cyndia Bent on 11/02/2013.  She was seen in follow up on 12/07/2103 and was progressing well.  She presents to the office today for evaluation of her sternal incision.  She states that about 5 days ago, a scab that had been on the upper portion of her wound got caught in her clothes and pulled off. Since then, she has noticed increasing soreness and redness around the site.  She has noticed some bloody drainage but denies purulence.  She also denies fevers or chills.      Current Outpatient Prescriptions  Medication Sig Dispense Refill  . amitriptyline (ELAVIL) 25 MG tablet Take 25 mg by mouth at bedtime.      Marland Kitchen aspirin EC 325 MG EC tablet Take 1 tablet (325 mg total) by mouth daily.  30 tablet  0  . buPROPion (WELLBUTRIN XL) 300 MG 24 hr tablet Take 1 tablet (300 mg total) by mouth every morning.  90 tablet  1  . clonazePAM (KLONOPIN) 1 MG tablet Take 1 mg by mouth 3 (three) times daily as needed for anxiety.      . DULoxetine (CYMBALTA) 60 MG capsule Take 60 mg by mouth 2 (two) times daily.      Marland Kitchen HYDROcodone-acetaminophen (NORCO) 10-325 MG per tablet Take 1 tablet by mouth every 4 (four) hours as needed for severe pain.  30 tablet  0  . HYDROcodone-acetaminophen (NORCO) 10-325 MG per tablet Take 1 tablet by mouth 4 (four) times daily.  120 tablet  0  . levothyroxine (SYNTHROID, LEVOTHROID) 75 MCG tablet Take 1 tablet (75 mcg total) by mouth daily.  30 tablet  3  . loratadine (CLARITIN) 10 MG tablet Take 10 mg by mouth daily as needed for allergies.      . metoprolol tartrate (LOPRESSOR) 25 MG tablet Take 0.5 tablets (12.5 mg total) by mouth 2 (two) times daily.  30 tablet  1  . Omega-3 Fatty Acids (FISH OIL PO) Take 1 capsule by mouth daily.      . simvastatin (ZOCOR) 20 MG tablet TAKE 1 TABLET (20 MG TOTAL) BY MOUTH AT BEDTIME.  90 tablet  1  .  solifenacin (VESICARE) 10 MG tablet Take 10 mg by mouth daily.      . cephALEXin (KEFLEX) 500 MG capsule Take 1 capsule (500 mg total) by mouth 3 (three) times daily. X 7 days  21 capsule  0   No current facility-administered medications for this visit.     Physical Exam: BP 140/89 HR 76 Resp 20 Wound: There is an area of superficial separation of the upper portion of the sternal wound with some surrounding erythema.  The wound is not obviously draining, but when pressed, there is a small amount of pus expressible.  This was sent for culture. Sternum is stable. Heart: RRR Lungs: Clear    Diagnostic Tests: Chest xray: No results found.     Assessment/Plan: The patient appears to have a superficial sternal wound infection with mild cellulitis. I have sent the drainage for culture, and started her empirically on Keflex 500 mg po tid x 1 week.  She will need to follow up with Dr. Cyndia Bent next week for a wound recheck, and is asked  to contact the office if she has worsening redness, swelling or drainage, or fever.

## 2013-12-25 LAB — WOUND CULTURE
Gram Stain: NONE SEEN
Gram Stain: NONE SEEN
Gram Stain: NONE SEEN

## 2013-12-27 ENCOUNTER — Ambulatory Visit (INDEPENDENT_AMBULATORY_CARE_PROVIDER_SITE_OTHER): Payer: Medicare Other | Admitting: Surgery

## 2013-12-27 ENCOUNTER — Encounter: Payer: Self-pay | Admitting: Surgery

## 2013-12-27 VITALS — BP 128/84 | HR 67 | Resp 20 | Ht 65.0 in | Wt 205.0 lb

## 2013-12-27 DIAGNOSIS — L089 Local infection of the skin and subcutaneous tissue, unspecified: Secondary | ICD-10-CM

## 2013-12-27 DIAGNOSIS — Z951 Presence of aortocoronary bypass graft: Secondary | ICD-10-CM

## 2013-12-27 DIAGNOSIS — I2510 Atherosclerotic heart disease of native coronary artery without angina pectoris: Secondary | ICD-10-CM

## 2013-12-27 DIAGNOSIS — T148XXA Other injury of unspecified body region, initial encounter: Secondary | ICD-10-CM

## 2013-12-27 NOTE — Progress Notes (Signed)
      HPI:  She returns today for examination of the chest incision. She was seen by Suzzanne Cloud PA-C on 12/22/2013 for some soreness and erythema around the top of the chest incision. There was a small amount of drainage and it cultured a few staph. She has been on Keflex and has been treating the open wound with Neosporin ointment and keeping it covered. She feels well and denies fever or chills.  Current Outpatient Prescriptions  Medication Sig Dispense Refill  . amitriptyline (ELAVIL) 25 MG tablet Take 25 mg by mouth at bedtime.      Marland Kitchen aspirin EC 325 MG EC tablet Take 1 tablet (325 mg total) by mouth daily.  30 tablet  0  . buPROPion (WELLBUTRIN XL) 300 MG 24 hr tablet Take 1 tablet (300 mg total) by mouth every morning.  90 tablet  1  . cephALEXin (KEFLEX) 500 MG capsule Take 1 capsule (500 mg total) by mouth 3 (three) times daily. X 7 days  21 capsule  0  . clonazePAM (KLONOPIN) 1 MG tablet Take 1 mg by mouth 3 (three) times daily as needed for anxiety.      . DULoxetine (CYMBALTA) 60 MG capsule Take 60 mg by mouth 2 (two) times daily.      Marland Kitchen HYDROcodone-acetaminophen (NORCO) 10-325 MG per tablet Take 1 tablet by mouth every 4 (four) hours as needed for severe pain.  30 tablet  0  . HYDROcodone-acetaminophen (NORCO) 10-325 MG per tablet Take 1 tablet by mouth 4 (four) times daily.  120 tablet  0  . levothyroxine (SYNTHROID, LEVOTHROID) 75 MCG tablet Take 1 tablet (75 mcg total) by mouth daily.  30 tablet  3  . loratadine (CLARITIN) 10 MG tablet Take 10 mg by mouth daily as needed for allergies.      . metoprolol tartrate (LOPRESSOR) 25 MG tablet Take 0.5 tablets (12.5 mg total) by mouth 2 (two) times daily.  30 tablet  1  . Omega-3 Fatty Acids (FISH OIL PO) Take 1 capsule by mouth daily.      . simvastatin (ZOCOR) 20 MG tablet TAKE 1 TABLET (20 MG TOTAL) BY MOUTH AT BEDTIME.  90 tablet  1  . solifenacin (VESICARE) 10 MG tablet Take 10 mg by mouth daily.       No current  facility-administered medications for this visit.     Physical Exam: BP 128/84  Pulse 67  Resp 20  Ht 5\' 5"  (1.651 m)  Wt 205 lb (92.987 kg)  BMI 34.11 kg/m2  SpO2 98% The small open wound at the top of the chest incision is almost healed. There is no erythema or drainage.  Diagnostic Tests:  none  Impression:  This is a superficial wound infection due to fat necrosis and separation of eschar at the top of the incision. I would expect it to resolve without further incident. She will complete the course of Keflex and continue present wound dressing until it is completely healed.  Plan:  She will return to see me if there is any further problem with the incision.

## 2013-12-29 ENCOUNTER — Encounter (INDEPENDENT_AMBULATORY_CARE_PROVIDER_SITE_OTHER): Payer: Self-pay

## 2013-12-29 ENCOUNTER — Ambulatory Visit (INDEPENDENT_AMBULATORY_CARE_PROVIDER_SITE_OTHER): Payer: Medicare Other | Admitting: Family Medicine

## 2013-12-29 ENCOUNTER — Encounter: Payer: Self-pay | Admitting: Family Medicine

## 2013-12-29 VITALS — BP 140/78 | HR 78 | Resp 16 | Ht 62.0 in | Wt 215.1 lb

## 2013-12-29 DIAGNOSIS — E669 Obesity, unspecified: Secondary | ICD-10-CM | POA: Diagnosis not present

## 2013-12-29 DIAGNOSIS — E039 Hypothyroidism, unspecified: Secondary | ICD-10-CM

## 2013-12-29 DIAGNOSIS — M549 Dorsalgia, unspecified: Secondary | ICD-10-CM

## 2013-12-29 DIAGNOSIS — I10 Essential (primary) hypertension: Secondary | ICD-10-CM

## 2013-12-29 DIAGNOSIS — R32 Unspecified urinary incontinence: Secondary | ICD-10-CM

## 2013-12-29 DIAGNOSIS — E1129 Type 2 diabetes mellitus with other diabetic kidney complication: Secondary | ICD-10-CM

## 2013-12-29 DIAGNOSIS — Z951 Presence of aortocoronary bypass graft: Secondary | ICD-10-CM | POA: Diagnosis not present

## 2013-12-29 DIAGNOSIS — F329 Major depressive disorder, single episode, unspecified: Secondary | ICD-10-CM

## 2013-12-29 DIAGNOSIS — E785 Hyperlipidemia, unspecified: Secondary | ICD-10-CM

## 2013-12-29 DIAGNOSIS — I2510 Atherosclerotic heart disease of native coronary artery without angina pectoris: Secondary | ICD-10-CM | POA: Diagnosis not present

## 2013-12-29 DIAGNOSIS — F411 Generalized anxiety disorder: Secondary | ICD-10-CM

## 2013-12-29 DIAGNOSIS — N189 Chronic kidney disease, unspecified: Secondary | ICD-10-CM

## 2013-12-29 DIAGNOSIS — F17200 Nicotine dependence, unspecified, uncomplicated: Secondary | ICD-10-CM

## 2013-12-29 DIAGNOSIS — F3289 Other specified depressive episodes: Secondary | ICD-10-CM

## 2013-12-29 MED ORDER — CHOLINE FENOFIBRATE 45 MG PO CPDR: 45.0000 mg | DELAYED_RELEASE_CAPSULE | Freq: Every day | ORAL | Status: DC

## 2013-12-29 MED ORDER — SIMVASTATIN 40 MG PO TABS: 40.0000 mg | ORAL_TABLET | Freq: Every day | ORAL | Status: DC

## 2013-12-29 NOTE — Patient Instructions (Signed)
F/U in 4  months, call if you need me before  Dose increase in zocor to 40 mg daily, ok to take two 61m tablets daily till done, additional medication for triglycerides is trilipix 45 mg daily  It is important that you exercise regularly at least 30 minutes 5 times a week. If you develop chest pain, have severe difficulty breathing, or feel very tired, stop exercising immediately and seek medical attention    A healthy diet is rich in fruit, vegetables and whole grains. Poultry fish, nuts and beans are a healthy choice for protein rather then red meat. A low sodium diet and drinking 64 ounces of water daily is generally recommended. Oils and sweet should be limited. Carbohydrates especially for those who are diabetic or overweight, should be limited to 45 to 60 gram per meal. It is important to eat on a regular schedule, at least 3 times daily. Snacks should be primarily fruits, vegetables or nuts.  Adequate rest, generally 6 to 8 hours per night is important for good health.Good sleep hygiene involves setting a regular bedtime, and turning off all sound and light in your sleep environment.Limiting caffeine intake will also help with the ability to rest well  Lab work needs to be done 3 to 5 days before your follow up visit please.Fasting lipid, cmp and eGFR, hBA1C and TSH and fasting lipid  All medications need to be brought to every visit

## 2013-12-31 NOTE — Assessment & Plan Note (Signed)
Undercorrected, dose adjustment made will f/u lab in 8 weeks

## 2013-12-31 NOTE — Assessment & Plan Note (Signed)
Completing antibiotic course prescribed 1 week ago for infection of scar. Needs referrral to local cardiologist for f/u of CAD

## 2013-12-31 NOTE — Assessment & Plan Note (Signed)
Controlled, no change in medication  

## 2013-12-31 NOTE — Assessment & Plan Note (Addendum)
Controlled, no change in medication,  Ideally want SBP under 140 , will re address next visit. DASH diet and commitment to daily physical activity for a minimum of 30 minutes discussed and encouraged, as a part of hypertension management. The importance of attaining a healthy weight is also discussed.

## 2013-12-31 NOTE — Progress Notes (Signed)
Subjective:    Patient ID: Tammy Suarez, female    DOB: 11-29-1945, 68 y.o.   MRN: [id number]  HPI The PT is here for follow up and re-evaluation of chronic medical conditions, medication management and review of any available recent lab and radiology data.  Preventive health is updated, specifically  Cancer screening and Immunization.   Questions or concerns regarding consultations or procedures which the PT has had in the interim are  Addressed.She is s/p CABG x 3 in 10/2013, and has quit smoking since then The PT denies any adverse reactions to current medications since the last visit.  There are no new concerns.  There are no specific complaints , currently completing an antibiotic course for op site infection      Review of Systems See HPI Denies recent fever or chills. Denies sinus pressure, nasal congestion, ear pain or sore throat. Denies chest congestion, productive cough or wheezing. Denies chest pains, palpitations and leg swelling Denies abdominal pain, nausea, vomiting,diarrhea or constipation.   Denies dysuria, frequency, hesitancy or incontinence. Chronic back pain unchanged Denies headaches, seizures, numbness, or tingling. Denies uncontrolled  depression, anxiety or insomnia. Denies skin break down or rash.        Objective:   Physical Exam  Patient alert and oriented and in no cardiopulmonary distress.  HEENT: No facial asymmetry, EOMI, no sinus tenderness,  oropharynx pink and moist.  Neck supple no adenopathy.  Chest: Clear to auscultation bilaterally.  CVS: S1, S2 no murmurs, no S3.  ABD: Soft non tender. Bowel sounds normal.  Ext: No edema  MS: Adequate though reduced  ROM spine, shoulders, hips and knees.  Skin: Intact, no ulcerations or rash noted.  Psych: Good eye contact, normal affect. Memory intact not anxious or depressed appearing.  CNS: CN 2-12 intact, power, tone and sensation normal throughout.       Assessment & Plan:    DM (diabetes mellitus), type 2 with renal complications Unchnanged. Patient educated about the importance of limiting  Carbohydrate intake , the need to commit to daily physical activity for a minimum of 30 minutes , and to commit weight loss. The fact that changes in all these areas will reduce need to take medication is stressed   ESSENTIAL HYPERTENSION, BENIGN Controlled, no change in medication,  Ideally want SBP under 140 , will re address next visit. DASH diet and commitment to daily physical activity for a minimum of 30 minutes discussed and encouraged, as a part of hypertension management. The importance of attaining a healthy weight is also discussed.   HYPOTHYROIDISM Undercorrected, dose adjustment made will f/u lab in 8 weeks  CKD (chronic kidney disease) stage 3, GFR 30-59 ml/min Importance of avoiding NSAIDS, keeping blood pressure and blood sugar controlled and smoking cessation is stressed Pt is followed by nephrology  DEPRESSION Controlled, no change in medication   ANXIETY STATE, UNSPECIFIED Controlled, no change in medication   BACK PAIN, CHRONIC Unchanged, continue current medication  OBESITY, UNSPECIFIED Deteriorated. Patient re-educated about  the importance of commitment to a  minimum of 150 minutes of exercise per week. The importance of healthy food choices with portion control discussed. Encouraged to start a food diary, count calories and to consider  joining a support group. Sample diet sheets offered. Goals set by the patient for the next several months.     Urinary incontinence Controlled, no change in medication   HYPERLIPIDEMIA Uncontrolled, add tricor Hyperlipidemia:Low fat diet discussed and encouraged.  Updated lab needed  at/ before next visit.    S/P CABG x 3 Completing antibiotic course prescribed 1 week ago for infection of scar. Needs referrral to local cardiologist for f/u of CAD

## 2013-12-31 NOTE — Assessment & Plan Note (Signed)
Deteriorated. Patient re-educated about  the importance of commitment to a  minimum of 150 minutes of exercise per week. The importance of healthy food choices with portion control discussed. Encouraged to start a food diary, count calories and to consider  joining a support group. Sample diet sheets offered. Goals set by the patient for the next several months.    

## 2013-12-31 NOTE — Assessment & Plan Note (Signed)
Unchnanged. Patient educated about the importance of limiting  Carbohydrate intake , the need to commit to daily physical activity for a minimum of 30 minutes , and to commit weight loss. The fact that changes in all these areas will reduce need to take medication is stressed

## 2013-12-31 NOTE — Assessment & Plan Note (Signed)
Importance of avoiding NSAIDS, keeping blood pressure and blood sugar controlled and smoking cessation is stressed Pt is followed by nephrology

## 2013-12-31 NOTE — Assessment & Plan Note (Signed)
Unchanged, continue current medication 

## 2013-12-31 NOTE — Assessment & Plan Note (Signed)
Uncontrolled, add tricor Hyperlipidemia:Low fat diet discussed and encouraged.  Updated lab needed at/ before next visit.

## 2014-01-07 ENCOUNTER — Other Ambulatory Visit: Payer: Self-pay | Admitting: Family Medicine

## 2014-01-12 ENCOUNTER — Ambulatory Visit (INDEPENDENT_AMBULATORY_CARE_PROVIDER_SITE_OTHER): Payer: Medicare Other | Admitting: Cardiology

## 2014-01-12 ENCOUNTER — Encounter: Payer: Self-pay | Admitting: Cardiology

## 2014-01-12 ENCOUNTER — Other Ambulatory Visit: Payer: Self-pay

## 2014-01-12 VITALS — BP 137/75 | HR 89 | Ht 65.0 in | Wt 215.0 lb

## 2014-01-12 DIAGNOSIS — E785 Hyperlipidemia, unspecified: Secondary | ICD-10-CM

## 2014-01-12 DIAGNOSIS — I2510 Atherosclerotic heart disease of native coronary artery without angina pectoris: Secondary | ICD-10-CM | POA: Diagnosis not present

## 2014-01-12 DIAGNOSIS — I10 Essential (primary) hypertension: Secondary | ICD-10-CM

## 2014-01-12 DIAGNOSIS — N183 Chronic kidney disease, stage 3 unspecified: Secondary | ICD-10-CM

## 2014-01-12 MED ORDER — HYDROCODONE-ACETAMINOPHEN 10-325 MG PO TABS: 1.0000 | ORAL_TABLET | Freq: Four times a day (QID) | ORAL | Status: DC

## 2014-01-12 NOTE — Patient Instructions (Addendum)
Your physician recommends that you schedule a follow-up appointment in:  3 months    Your physician recommends that you continue on your current medications as directed. Please refer to the Current Medication list given to you today.     Thank you for choosing [facility] !   

## 2014-01-12 NOTE — Assessment & Plan Note (Signed)
Recent LDL 81, on Zocor.

## 2014-01-12 NOTE — Assessment & Plan Note (Signed)
Not on any specific antihypertensives at this time. Keep follow up with Dr. Moshe Cipro.

## 2014-01-12 NOTE — Progress Notes (Signed)
Clinical Summary Tammy Suarez is a 68 y.o.female presenting to the office for followup. She was most recently seen by Ms. Lawrence NP in January 2015, a former patient of Tammy Suarez. This is our first meeting in the office. I reviewed her records.  Recent lab work showed cholesterol 171, triglycerides 310, HDL 28, LDL 81, potassium 5.6, BUN 42, creatinine 1.7, hemoglobin A1c 6.4, TSH 7.8. She reports tolerating Zocor. She is not on any specific antihypertensives at this time, was previously on chlorthalidone by report.  Echocardiogram from December 2014 showed LVEF 55-60% with no regional wall motion abnormalities, diastolic dysfunction, no major valvular abnormalities.  Today we discussed cardiac rehabilitation, she states she is not interested in this program now. She feels like she has recuperated well. I did encourage her to at least consider a regular walking regimen.  ECG from January showed sinus rhythm with rightward axis.   Allergies  Allergen Reactions  . Meperidine Hcl Nausea And Vomiting  . Niacin Other (See Comments)    REACTION: face peeling and burning  . Propoxyphene N-Acetaminophen Nausea And Vomiting    Current Outpatient Prescriptions  Medication Sig Dispense Refill  . amitriptyline (ELAVIL) 25 MG tablet Take 25 mg by mouth at bedtime.      Marland Kitchen aspirin EC 81 MG tablet Take 81 mg by mouth daily.      Marland Kitchen buPROPion (WELLBUTRIN XL) 300 MG 24 hr tablet Take 1 tablet (300 mg total) by mouth every morning.  90 tablet  1  . Choline Fenofibrate (TRILIPIX) 45 MG capsule Take 1 capsule (45 mg total) by mouth daily.  30 capsule  3  . clonazePAM (KLONOPIN) 1 MG tablet Take 1 mg by mouth 3 (three) times daily as needed for anxiety.      . DULoxetine (CYMBALTA) 60 MG capsule Take 60 mg by mouth 2 (two) times daily.      Marland Kitchen HYDROcodone-acetaminophen (NORCO) 10-325 MG per tablet Take 1 tablet by mouth 4 (four) times daily.  120 tablet  0  . levothyroxine (SYNTHROID, LEVOTHROID) 75  MCG tablet Take 1 tablet (75 mcg total) by mouth daily.  30 tablet  3  . loratadine (CLARITIN) 10 MG tablet Take 10 mg by mouth daily as needed for allergies.      . Omega-3 Fatty Acids (FISH OIL PO) Take 1 capsule by mouth daily.      . simvastatin (ZOCOR) 40 MG tablet Take 1 tablet (40 mg total) by mouth at bedtime.  30 tablet  3  . solifenacin (VESICARE) 10 MG tablet Take 10 mg by mouth daily.       No current facility-administered medications for this visit.    Past Medical History  Diagnosis Date  . Vitamin B 12 deficiency   . Chronic back pain   . GERD (gastroesophageal reflux disease)   . Hyperlipidemia   . Obesity   . Essential hypertension, benign   . Type 2 diabetes mellitus without complications   . History of conjunctivitis   . CKD (chronic kidney disease) stage 3, GFR 30-59 ml/min   . Coronary atherosclerosis of native coronary artery     Multivessel status post CABG - LIMA to LAD, SVG to OM and SVG to PDA    Social History Tammy Suarez reports that she quit smoking about 20 months ago. Her smoking use included Cigarettes. She smoked 1.00 pack per day. She has never used smokeless tobacco. Tammy Suarez reports that she does not drink alcohol.  Review of  Systems No palpitations, syncope. She was having some trouble with her balance and dizziness - does have resolved. No claudication. Otherwise negative.  Physical Examination Filed Vitals:   01/12/14 1012  BP: 137/75  Pulse: 89   Filed Weights   01/12/14 1012  Weight: 215 lb (97.523 kg)   Overweight woman, appears comfortable at rest. HEENT: Conjunctiva and lids normal, oropharynx clear. Thorax: Well-healing sternal incision, no drainage or unusual erythema. Neck: Supple, no elevated JVP or carotid bruits, no thyromegaly. Lungs: Clear to auscultation, nonlabored breathing at rest. Cardiac: Regular rate and rhythm, no S3 or significant systolic murmur, no pericardial rub. Abdomen: Soft, nontender, bowel sounds  present, no guarding or rebound. Extremities: No pitting edema, distal pulses 2+. Skin: Warm and dry. Musculoskeletal: No kyphosis. Neuropsychiatric: Alert and oriented x3, affect grossly appropriate.   Problem List and Plan   Coronary atherosclerosis of native coronary artery Symptomatically stable with history of multivessel disease status post CABG by Tammy Suarez back in December 2014. She is a former patient of Tammy Suarez. Continue aspirin and statin, recommended walking regimen since she does not want to pursue cardiac rehabilitation. Depending on blood pressure and heart rate, might be a candidate for low-dose beta blocker or perhaps Norvasc - she should keep followup with Tammy Suarez. We will see her back in 3 months.  CKD (chronic kidney disease) stage 3, GFR 30-59 ml/min Recent creatinine 1.7. She is not on ACE inhibitor or ARB.  Essential hypertension, benign Not on any specific antihypertensives at this time. Keep follow up with Tammy Suarez.  HYPERLIPIDEMIA Recent LDL 81, on Zocor.    Tammy Suarez, M.D., F.A.C.C.

## 2014-01-12 NOTE — Assessment & Plan Note (Addendum)
Recent creatinine 1.7. She is not on ACE inhibitor or ARB.

## 2014-01-12 NOTE — Assessment & Plan Note (Signed)
Symptomatically stable with history of multivessel disease status post CABG by Dr. Cyndia Bent back in December 2014. She is a former patient of Dr. Percival Spanish. Continue aspirin and statin, recommended walking regimen since she does not want to pursue cardiac rehabilitation. Depending on blood pressure and heart rate, might be a candidate for low-dose beta blocker or perhaps Norvasc - she should keep followup with Dr. Moshe Cipro. We will see her back in 3 months.

## 2014-01-30 ENCOUNTER — Other Ambulatory Visit: Payer: Self-pay | Admitting: Physician Assistant

## 2014-02-04 ENCOUNTER — Other Ambulatory Visit: Payer: Self-pay | Admitting: Physician Assistant

## 2014-02-09 ENCOUNTER — Other Ambulatory Visit: Payer: Self-pay

## 2014-02-09 MED ORDER — HYDROCODONE-ACETAMINOPHEN 10-325 MG PO TABS: 1.0000 | ORAL_TABLET | Freq: Four times a day (QID) | ORAL | Status: DC

## 2014-02-13 ENCOUNTER — Other Ambulatory Visit: Payer: Self-pay | Admitting: Family Medicine

## 2014-02-15 ENCOUNTER — Other Ambulatory Visit: Payer: Self-pay | Admitting: Family Medicine

## 2014-03-07 ENCOUNTER — Ambulatory Visit: Payer: Medicare Other | Admitting: Family Medicine

## 2014-03-20 ENCOUNTER — Other Ambulatory Visit: Payer: Self-pay

## 2014-03-20 MED ORDER — HYDROCODONE-ACETAMINOPHEN 10-325 MG PO TABS: 1.0000 | ORAL_TABLET | Freq: Four times a day (QID) | ORAL | Status: DC

## 2014-04-11 ENCOUNTER — Ambulatory Visit (INDEPENDENT_AMBULATORY_CARE_PROVIDER_SITE_OTHER): Payer: Medicare Other | Admitting: Family Medicine

## 2014-04-11 ENCOUNTER — Encounter: Payer: Self-pay | Admitting: Family Medicine

## 2014-04-11 ENCOUNTER — Ambulatory Visit (INDEPENDENT_AMBULATORY_CARE_PROVIDER_SITE_OTHER): Payer: Medicare Other | Admitting: Cardiology

## 2014-04-11 ENCOUNTER — Encounter: Payer: Self-pay | Admitting: Cardiology

## 2014-04-11 VITALS — BP 120/80 | HR 73 | Ht 65.0 in | Wt 213.0 lb

## 2014-04-11 VITALS — BP 140/80 | HR 82 | Resp 16 | Wt 214.4 lb

## 2014-04-11 DIAGNOSIS — F329 Major depressive disorder, single episode, unspecified: Secondary | ICD-10-CM

## 2014-04-11 DIAGNOSIS — E1129 Type 2 diabetes mellitus with other diabetic kidney complication: Secondary | ICD-10-CM

## 2014-04-11 DIAGNOSIS — J440 Chronic obstructive pulmonary disease with acute lower respiratory infection: Secondary | ICD-10-CM | POA: Insufficient documentation

## 2014-04-11 DIAGNOSIS — I10 Essential (primary) hypertension: Secondary | ICD-10-CM

## 2014-04-11 DIAGNOSIS — E039 Hypothyroidism, unspecified: Secondary | ICD-10-CM | POA: Diagnosis not present

## 2014-04-11 DIAGNOSIS — I2510 Atherosclerotic heart disease of native coronary artery without angina pectoris: Secondary | ICD-10-CM

## 2014-04-11 DIAGNOSIS — E785 Hyperlipidemia, unspecified: Secondary | ICD-10-CM | POA: Diagnosis not present

## 2014-04-11 DIAGNOSIS — E669 Obesity, unspecified: Secondary | ICD-10-CM | POA: Diagnosis not present

## 2014-04-11 DIAGNOSIS — F3289 Other specified depressive episodes: Secondary | ICD-10-CM | POA: Diagnosis not present

## 2014-04-11 DIAGNOSIS — M549 Dorsalgia, unspecified: Secondary | ICD-10-CM | POA: Diagnosis not present

## 2014-04-11 DIAGNOSIS — J42 Unspecified chronic bronchitis: Secondary | ICD-10-CM | POA: Diagnosis not present

## 2014-04-11 LAB — COMPLETE METABOLIC PANEL WITH GFR
ALT: 13 U/L (ref 0–35)
AST: 26 U/L (ref 0–37)
Albumin: 3.6 g/dL (ref 3.5–5.2)
Alkaline Phosphatase: 62 U/L (ref 39–117)
BUN: 29 mg/dL — AB (ref 6–23)
CALCIUM: 9.7 mg/dL (ref 8.4–10.5)
CHLORIDE: 101 meq/L (ref 96–112)
CO2: 28 mEq/L (ref 19–32)
CREATININE: 1.87 mg/dL — AB (ref 0.50–1.10)
GFR, EST NON AFRICAN AMERICAN: 27 mL/min — AB
GFR, Est African American: 31 mL/min — ABNORMAL LOW
GLUCOSE: 106 mg/dL — AB (ref 70–99)
Potassium: 4.9 mEq/L (ref 3.5–5.3)
Sodium: 138 mEq/L (ref 135–145)
Total Bilirubin: 0.5 mg/dL (ref 0.2–1.2)
Total Protein: 6.7 g/dL (ref 6.0–8.3)

## 2014-04-11 LAB — HEMOGLOBIN A1C
Hgb A1c MFr Bld: 6.3 % — ABNORMAL HIGH (ref <5.7)
Hgb A1c MFr Bld: 6.5 % — ABNORMAL HIGH (ref <5.7)
MEAN PLASMA GLUCOSE: 134 mg/dL — AB (ref <117)
Mean Plasma Glucose: 140 mg/dL — ABNORMAL HIGH (ref <117)

## 2014-04-11 MED ORDER — AZITHROMYCIN 250 MG PO TABS: ORAL_TABLET | ORAL | Status: DC

## 2014-04-11 MED ORDER — METHYLPREDNISOLONE ACETATE 80 MG/ML IJ SUSP: 80.0000 mg | Freq: Once | INTRAMUSCULAR | Status: DC

## 2014-04-11 MED ORDER — BENZONATATE 100 MG PO CAPS: 100.0000 mg | ORAL_CAPSULE | Freq: Two times a day (BID) | ORAL | Status: DC | PRN

## 2014-04-11 MED ORDER — PREDNISONE 5 MG PO TABS: 5.0000 mg | ORAL_TABLET | Freq: Two times a day (BID) | ORAL | Status: AC

## 2014-04-11 NOTE — Patient Instructions (Addendum)
F/u end June , call if you need me before  Antibiotic and decongestant is sent in for your cough  Injections in office and prednisone for back pain radiating down your leg  HBA1C, chem 7 and EGFR and TSH today  My condolence and prayers with your recent loss, please call for therapy if you decide on this, a few sessions I believe this will help yopu ehrn you are ready. In the interim t talking with your pastor is good

## 2014-04-11 NOTE — Assessment & Plan Note (Signed)
Good blood pressure control today.

## 2014-04-11 NOTE — Progress Notes (Signed)
Clinical Summary Ms. Tenner is a 68 y.o.female last seen in February of this year. She has been stable from a cardiac perspective, no nitroglycerin use. She is tearful today, tells me that she lost her daughter to illness approximately 2 weeks ago. She is still grieving.  She reports compliance with her medications. Has had a recent "cold." Plans to see Dr. Moshe Cipro soon for followup  Echocardiogram from December 2014 showed LVEF 55-60% with no regional wall motion abnormalities, diastolic dysfunction, no major valvular abnormalities.  Lipid panel from February of this year she cholesterol 171, triglycerides 310, HDL 28, LDL 81.   Allergies  Allergen Reactions  . Meperidine Hcl Nausea And Vomiting  . Niacin Other (See Comments)    REACTION: face peeling and burning  . Propoxyphene N-Acetaminophen Nausea And Vomiting    Current Outpatient Prescriptions  Medication Sig Dispense Refill  . amitriptyline (ELAVIL) 25 MG tablet Take 25 mg by mouth at bedtime.      Marland Kitchen aspirin EC 81 MG tablet Take 81 mg by mouth daily.      . benazepril (LOTENSIN) 20 MG tablet Take 20 mg by mouth daily.       Marland Kitchen buPROPion (WELLBUTRIN XL) 300 MG 24 hr tablet Take 1 tablet (300 mg total) by mouth every morning.  90 tablet  1  . Choline Fenofibrate (TRILIPIX) 45 MG capsule Take 1 capsule (45 mg total) by mouth daily.  30 capsule  3  . clonazePAM (KLONOPIN) 1 MG tablet TAKE 1 TABLET THREE TIMES A DAY  90 tablet  2  . DULoxetine (CYMBALTA) 60 MG capsule Take 60 mg by mouth 2 (two) times daily.      Marland Kitchen HYDROcodone-acetaminophen (NORCO) 10-325 MG per tablet Take 1 tablet by mouth 4 (four) times daily.  120 tablet  0  . levothyroxine (SYNTHROID, LEVOTHROID) 75 MCG tablet Take 1 tablet (75 mcg total) by mouth daily.  30 tablet  3  . loratadine (CLARITIN) 10 MG tablet Take 10 mg by mouth daily as needed for allergies.      . naproxen (NAPROSYN) 500 MG tablet       . Omega-3 Fatty Acids (FISH OIL PO) Take 1 capsule by  mouth daily.      . ranitidine (ZANTAC) 150 MG tablet Take 150 mg by mouth.       . simvastatin (ZOCOR) 40 MG tablet Take 1 tablet (40 mg total) by mouth at bedtime.  30 tablet  3  . solifenacin (VESICARE) 10 MG tablet Take 10 mg by mouth daily.       No current facility-administered medications for this visit.    Past Medical History  Diagnosis Date  . Vitamin B 12 deficiency   . Chronic back pain   . GERD (gastroesophageal reflux disease)   . Hyperlipidemia   . Obesity   . Essential hypertension, benign   . Type 2 diabetes mellitus without complications   . History of conjunctivitis   . CKD (chronic kidney disease) stage 3, GFR 30-59 ml/min   . Coronary atherosclerosis of native coronary artery     Multivessel status post CABG - LIMA to LAD, SVG to OM and SVG to PDA    Past Surgical History  Procedure Laterality Date  . Cervical laminectomy    . Cholecystectomy    . Inguinal herniorrhaphy    . Vesicovaginal fistula closure w/ tah    . Lumbar fusion    . Left index finger repair    .  Laparoscopic gastric banding  2009  . Cosmetic surgery  07/01/2012    Recurrent skin infection on lower abdomen, [facility]  . Abdominal hysterectomy    . Spine surgery    . Coronary artery bypass graft N/A 11/02/2013    Procedure: CORONARY ARTERY BYPASS GRAFTING (CABG);  Surgeon: Gaye Pollack, MD;  Location: [facility];  Service: Open Heart Surgery;  Laterality: N/A;  CABG x three, using left internal mammary artery and right leg greater saphenous vein harvested endoscopically  . Intraoperative transesophageal echocardiogram N/A 11/02/2013    Procedure: INTRAOPERATIVE TRANSESOPHAGEAL ECHOCARDIOGRAM;  Surgeon: Gaye Pollack, MD;  Location: [facility];  Service: Open Heart Surgery;  Laterality: N/A;    Social History Ms. Hilgers reports that she quit smoking about 23 months ago. Her smoking use included Cigarettes. She smoked 1.00 pack per day. She has never used smokeless tobacco. Ms. Florence reports  that she does not drink alcohol.  Review of Systems Negative except as outlined.  Physical Examination Filed Vitals:   04/11/14 0852  BP: 120/80  Pulse: 73   Filed Weights   04/11/14 0852  Weight: 213 lb (96.616 kg)    Overweight woman, appears comfortable at rest.  HEENT: Conjunctiva and lids normal, oropharynx clear.  Thorax: Well-healing sternal incision. Neck: Supple, no elevated JVP or carotid bruits, no thyromegaly.  Lungs: Clear to auscultation, nonlabored breathing at rest.  Cardiac: Regular rate and rhythm, no S3 or significant systolic murmur, no pericardial rub.  Abdomen: Soft, nontender, bowel sounds present, no guarding or rebound.  Extremities: No pitting edema, distal pulses 2+.    Problem List and Plan   Coronary atherosclerosis of native coronary artery Symptomatically stable with multivessel disease status post CABG. Continue medical therapy and observation.  HYPERLIPIDEMIA LDL 81 in February, continue statin therapy.  Essential hypertension, benign Good blood pressure control today.    Satira Sark, M.D., F.A.C.C.

## 2014-04-11 NOTE — Assessment & Plan Note (Signed)
LDL 81 in February, continue statin therapy.

## 2014-04-11 NOTE — Assessment & Plan Note (Signed)
Symptomatically stable with multivessel disease status post CABG. Continue medical therapy and observation.

## 2014-04-11 NOTE — Patient Instructions (Signed)
Your physician wants you to follow-up in: 6 months You will receive a reminder letter in the mail two months in advance. If you don't receive a letter, please call our office to schedule the follow-up appointment.     Your physician recommends that you continue on your current medications as directed. Please refer to the Current Medication list given to you today.      Thank you for choosing [facility] !        

## 2014-04-12 ENCOUNTER — Other Ambulatory Visit: Payer: Self-pay

## 2014-04-12 LAB — LIPID PANEL
Cholesterol: 143 mg/dL (ref 0–200)
HDL: 37 mg/dL — AB (ref >39)
LDL CALC: 68 mg/dL (ref 0–99)
TRIGLYCERIDES: 188 mg/dL — AB (ref <150)
Total CHOL/HDL Ratio: 3.9 Ratio
VLDL: 38 mg/dL (ref 0–40)

## 2014-04-12 LAB — COMPLETE METABOLIC PANEL WITH GFR
ALT: 12 U/L (ref 0–35)
AST: 24 U/L (ref 0–37)
Albumin: 3.8 g/dL (ref 3.5–5.2)
Alkaline Phosphatase: 61 U/L (ref 39–117)
BUN: 28 mg/dL — AB (ref 6–23)
CALCIUM: 9.7 mg/dL (ref 8.4–10.5)
CHLORIDE: 100 meq/L (ref 96–112)
CO2: 29 meq/L (ref 19–32)
CREATININE: 1.81 mg/dL — AB (ref 0.50–1.10)
GFR, EST AFRICAN AMERICAN: 33 mL/min — AB
GFR, Est Non African American: 28 mL/min — ABNORMAL LOW
Glucose, Bld: 144 mg/dL — ABNORMAL HIGH (ref 70–99)
Potassium: 5 mEq/L (ref 3.5–5.3)
Sodium: 137 mEq/L (ref 135–145)
Total Bilirubin: 0.5 mg/dL (ref 0.2–1.2)
Total Protein: 6.5 g/dL (ref 6.0–8.3)

## 2014-04-12 LAB — TSH
TSH: 2.34 u[IU]/mL (ref 0.350–4.500)
TSH: 1.608 u[IU]/mL (ref 0.350–4.500)

## 2014-04-12 MED ORDER — HYDROCODONE-ACETAMINOPHEN 10-325 MG PO TABS: 1.0000 | ORAL_TABLET | Freq: Four times a day (QID) | ORAL | Status: DC

## 2014-04-17 ENCOUNTER — Emergency Department (HOSPITAL_COMMUNITY)
Admission: EM | Admit: 2014-04-17 | Discharge: 2014-04-17 | Disposition: A | Discharge status: Home / Self Care | Payer: Medicare Other | Attending: Emergency Medicine | Admitting: Emergency Medicine

## 2014-04-17 ENCOUNTER — Encounter (HOSPITAL_COMMUNITY): Payer: Self-pay | Admitting: Emergency Medicine

## 2014-04-17 ENCOUNTER — Telehealth: Payer: Self-pay

## 2014-04-17 ENCOUNTER — Emergency Department (HOSPITAL_COMMUNITY): Payer: Medicare Other

## 2014-04-17 DIAGNOSIS — I2510 Atherosclerotic heart disease of native coronary artery without angina pectoris: Secondary | ICD-10-CM | POA: Insufficient documentation

## 2014-04-17 DIAGNOSIS — Z87891 Personal history of nicotine dependence: Secondary | ICD-10-CM | POA: Diagnosis not present

## 2014-04-17 DIAGNOSIS — N183 Chronic kidney disease, stage 3 unspecified: Secondary | ICD-10-CM | POA: Diagnosis not present

## 2014-04-17 DIAGNOSIS — Z79899 Other long term (current) drug therapy: Secondary | ICD-10-CM | POA: Insufficient documentation

## 2014-04-17 DIAGNOSIS — E785 Hyperlipidemia, unspecified: Secondary | ICD-10-CM | POA: Insufficient documentation

## 2014-04-17 DIAGNOSIS — Y939 Activity, unspecified: Secondary | ICD-10-CM | POA: Insufficient documentation

## 2014-04-17 DIAGNOSIS — X58XXXA Exposure to other specified factors, initial encounter: Secondary | ICD-10-CM | POA: Insufficient documentation

## 2014-04-17 DIAGNOSIS — Z951 Presence of aortocoronary bypass graft: Secondary | ICD-10-CM | POA: Diagnosis not present

## 2014-04-17 DIAGNOSIS — E119 Type 2 diabetes mellitus without complications: Secondary | ICD-10-CM | POA: Diagnosis not present

## 2014-04-17 DIAGNOSIS — Z7982 Long term (current) use of aspirin: Secondary | ICD-10-CM | POA: Diagnosis not present

## 2014-04-17 DIAGNOSIS — I129 Hypertensive chronic kidney disease with stage 1 through stage 4 chronic kidney disease, or unspecified chronic kidney disease: Secondary | ICD-10-CM | POA: Diagnosis not present

## 2014-04-17 DIAGNOSIS — G8929 Other chronic pain: Secondary | ICD-10-CM | POA: Insufficient documentation

## 2014-04-17 DIAGNOSIS — E669 Obesity, unspecified: Secondary | ICD-10-CM | POA: Insufficient documentation

## 2014-04-17 DIAGNOSIS — M79609 Pain in unspecified limb: Secondary | ICD-10-CM | POA: Diagnosis not present

## 2014-04-17 DIAGNOSIS — Z8669 Personal history of other diseases of the nervous system and sense organs: Secondary | ICD-10-CM | POA: Diagnosis not present

## 2014-04-17 DIAGNOSIS — I10 Essential (primary) hypertension: Secondary | ICD-10-CM | POA: Diagnosis not present

## 2014-04-17 DIAGNOSIS — S7010XA Contusion of unspecified thigh, initial encounter: Secondary | ICD-10-CM | POA: Diagnosis not present

## 2014-04-17 DIAGNOSIS — Y929 Unspecified place or not applicable: Secondary | ICD-10-CM | POA: Insufficient documentation

## 2014-04-17 LAB — CBC WITH DIFFERENTIAL/PLATELET
BASOS PCT: 0 % (ref 0–1)
Basophils Absolute: 0 10*3/uL (ref 0.0–0.1)
EOS ABS: 0.2 10*3/uL (ref 0.0–0.7)
EOS PCT: 2 % (ref 0–5)
HCT: 31 % — ABNORMAL LOW (ref 36.0–46.0)
Hemoglobin: 10.4 g/dL — ABNORMAL LOW (ref 12.0–15.0)
LYMPHS ABS: 2.9 10*3/uL (ref 0.7–4.0)
Lymphocytes Relative: 30 % (ref 12–46)
MCH: 31.5 pg (ref 26.0–34.0)
MCHC: 33.5 g/dL (ref 30.0–36.0)
MCV: 93.9 fL (ref 78.0–100.0)
Monocytes Absolute: 0.8 10*3/uL (ref 0.1–1.0)
Monocytes Relative: 8 % (ref 3–12)
Neutro Abs: 5.6 10*3/uL (ref 1.7–7.7)
Neutrophils Relative %: 60 % (ref 43–77)
PLATELETS: 425 10*3/uL — AB (ref 150–400)
RBC: 3.3 MIL/uL — AB (ref 3.87–5.11)
RDW: 13.2 % (ref 11.5–15.5)
WBC: 9.5 10*3/uL (ref 4.0–10.5)

## 2014-04-17 NOTE — ED Provider Notes (Signed)
CSN: [id number]     Arrival date & time 04/17/14  1117 History  This chart was scribed for No att. providers found,  by Stacy Gardner, ED Scribe. The patient was seen in room APA18/APA18 and the patient's care was started at 11:36 AM.   First MD Initiated Contact with Patient 04/17/14 1129     Chief Complaint  Patient presents with  . Leg Pain     (Consider location/radiation/quality/duration/timing/severity/associated sxs/prior Treatment) Patient is a 68 y.o. female presenting with leg pain. The history is provided by the patient and medical records. No language interpreter was used.  Leg Pain  HPI Comments: Tammy Suarez is a 68 y.o. female who presents to the Emergency Department complaining of right leg pain since 5/27. She was given a steroid shot for the sciatica pain on the same day of onset, before her symptoms started. She has ecchymosis to her right lower leg, onset seven days ago is is progressively getting worse. Pt has pain that radiates from her hip to her lower leg.  She feels like a pulling sensation when she leans to the left. Denies being on any blood thinner. Pt states that she rolled off the bed but she denies hitting her buttock.    Past Medical History  Diagnosis Date  . Vitamin B 12 deficiency   . Chronic back pain   . GERD (gastroesophageal reflux disease)   . Hyperlipidemia   . Obesity   . Essential hypertension, benign   . Type 2 diabetes mellitus without complications   . History of conjunctivitis   . Coronary atherosclerosis of native coronary artery     Multivessel status post CABG - LIMA to LAD, SVG to OM and SVG to PDA  . CKD (chronic kidney disease) stage 3, GFR 30-59 ml/min    Past Surgical History  Procedure Laterality Date  . Cervical laminectomy    . Cholecystectomy    . Inguinal herniorrhaphy    . Vesicovaginal fistula closure w/ tah    . Lumbar fusion    . Left index finger repair    . Laparoscopic gastric banding  2009  . Cosmetic  surgery  07/01/2012    Recurrent skin infection on lower abdomen, [facility]  . Abdominal hysterectomy    . Spine surgery    . Coronary artery bypass graft N/A 11/02/2013    Procedure: CORONARY ARTERY BYPASS GRAFTING (CABG);  Surgeon: Gaye Pollack, MD;  Location: [place];  Service: Open Heart Surgery;  Laterality: N/A;  CABG x three, using left internal mammary artery and right leg greater saphenous vein harvested endoscopically  . Intraoperative transesophageal echocardiogram N/A 11/02/2013    Procedure: INTRAOPERATIVE TRANSESOPHAGEAL ECHOCARDIOGRAM;  Surgeon: Gaye Pollack, MD;  Location: [facility] OR;  Service: Open Heart Surgery;  Laterality: N/A;  . Hernia repair    . Back surgery    . Cardiac surgery     Family History  Problem Relation Age of Onset  . Heart attack Mother   . Heart attack Father   . Hypertension Sister   . Heart attack Sister   . Cancer Brother     Renal cell    History  Substance Use Topics  . Smoking status: Former Smoker -- 1.00 packs/day    Types: Cigarettes    Quit date: 05/08/2012  . Smokeless tobacco: Never Used     Comment: quit for 4 months   . Alcohol Use: No   OB History   Grav Para Term Preterm Abortions TAB  SAB Ect Mult Living                 Review of Systems  Musculoskeletal: Positive for arthralgias and myalgias.  Skin: Positive for color change.  Hematological: Does not bruise/bleed easily.      Allergies  Meperidine hcl; Niacin; and Propoxyphene n-acetaminophen  Home Medications   Prior to Admission medications   Medication Sig Start Date End Date Taking? Authorizing Provider  amitriptyline (ELAVIL) 25 MG tablet Take 25 mg by mouth at bedtime.   Yes Historical Provider, MD  aspirin EC 81 MG tablet Take 81 mg by mouth daily.   Yes Historical Provider, MD  benazepril (LOTENSIN) 20 MG tablet Take 20 mg by mouth daily.  03/06/14  Yes Historical Provider, MD  buPROPion (WELLBUTRIN XL) 300 MG 24 hr tablet Take 1 tablet (300 mg total) by  mouth every morning. 10/09/13 10/09/14 Yes Fayrene Helper, MD  Choline Fenofibrate (TRILIPIX) 45 MG capsule Take 1 capsule (45 mg total) by mouth daily. 12/29/13  Yes Fayrene Helper, MD  clonazePAM (KLONOPIN) 1 MG tablet TAKE 1 TABLET THREE TIMES A DAY   Yes Fayrene Helper, MD  DULoxetine (CYMBALTA) 60 MG capsule Take 60 mg by mouth 2 (two) times daily.   Yes Historical Provider, MD  HYDROcodone-acetaminophen (NORCO) 10-325 MG per tablet Take 1 tablet by mouth 4 (four) times daily. 04/12/14  Yes Fayrene Helper, MD  levothyroxine (SYNTHROID, LEVOTHROID) 75 MCG tablet Take 1 tablet (75 mcg total) by mouth daily. 12/19/13  Yes Fayrene Helper, MD  loratadine (CLARITIN) 10 MG tablet Take 10 mg by mouth daily as needed for allergies.   Yes Historical Provider, MD  naproxen (NAPROSYN) 500 MG tablet Take 500 mg by mouth every evening.  03/31/14  Yes Historical Provider, MD  Omega-3 Fatty Acids (FISH OIL PO) Take 1 capsule by mouth daily.   Yes Historical Provider, MD  ranitidine (ZANTAC) 150 MG tablet Take 150 mg by mouth 2 (two) times daily.  02/12/14  Yes Historical Provider, MD  simvastatin (ZOCOR) 40 MG tablet Take 1 tablet (40 mg total) by mouth at bedtime. 12/29/13  Yes Fayrene Helper, MD  solifenacin (VESICARE) 10 MG tablet Take 10 mg by mouth daily.   Yes Historical Provider, MD   BP 158/79  Pulse 78  Temp(Src) 97.7 F (36.5 C) (Oral)  Resp 18  Ht 5\' 5"  (1.651 m)  Wt 213 lb (96.616 kg)  BMI 35.44 kg/m2  SpO2 97% Physical Exam  Nursing note and vitals reviewed. Constitutional: She is oriented to person, place, and time. She appears well-developed and well-nourished.  HENT:  Head: Normocephalic.  Eyes: Pupils are equal, round, and reactive to light.  Neck: Normal range of motion.  Cardiovascular: Normal rate.   Pulmonary/Chest: Effort normal.  Musculoskeletal: Normal range of motion. She exhibits tenderness. She exhibits no edema.  She has tenderness posteriorly,  medially and anterior.  No induration.  Mild edema. Pt has tenderness from left knee to upper hip. No pain with flexion or extension of hip or knee. No tender over the quadriceps  or hamstring  Neurological: She is alert and oriented to person, place, and time.  Skin: Skin is warm and dry. She is not diaphoretic.  ecchymosis to the right lower extremity. Most intensely on the posterior distal left thigh.No petechiae. No ecchymosis of the abdomen.      ED Course  Procedures (including critical care time) DIAGNOSTIC STUDIES: Oxygen Saturation is 979% on room  air, normal by my interpretation.    COORDINATION OF CARE:  11:39 AM Discussed course of care with pt . Pt understands and agrees.   Labs Review Labs Reviewed  CBC WITH DIFFERENTIAL - Abnormal; Notable for the following:    RBC 3.30 (*)    Hemoglobin 10.4 (*)    HCT 31.0 (*)    Platelets 425 (*)    All other components within normal limits    Imaging Review US Venous Img Lower Unilateral Right  04/17/2014   CLINICAL DATA:  Right lower extremity pain and varicosities.  EXAM: RIGHT LOWER EXTREMITY VENOUS DOPPLER ULTRASOUND  TECHNIQUE: Gray-scale sonography with graded compression, as well as color Doppler and duplex ultrasound were performed to evaluate the lower extremity deep venous systems from the level of the common femoral vein and including the common femoral, femoral, profunda femoral, popliteal and calf veins including the posterior tibial, peroneal and gastrocnemius veins when visible. The superficial great saphenous vein was also interrogated. Spectral Doppler was utilized to evaluate flow at rest and with distal augmentation maneuvers in the common femoral, femoral and popliteal veins.  COMPARISON:  None.  FINDINGS: Common Femoral Vein: No evidence of thrombus. Normal compressibility, respiratory phasicity and response to augmentation.  Saphenofemoral Junction: No evidence of thrombus. Normal compressibility and flow on  color Doppler imaging.  Profunda Femoral Vein: No evidence of thrombus. Normal compressibility and flow on color Doppler imaging.  Femoral Vein: No evidence of thrombus. Normal compressibility, respiratory phasicity and response to augmentation.  Popliteal Vein: No evidence of thrombus. Normal compressibility, respiratory phasicity and response to augmentation.  Calf Veins: No evidence of thrombus. Normal compressibility and flow on color Doppler imaging.  Superficial Great Saphenous Vein: No evidence of thrombus. Normal compressibility and flow on color Doppler imaging.  Venous Reflux:  None.  Other Findings: No visualized varicosities or evidence of superficial thrombophlebitis. Small, irregularly shaped fluid collection in the popliteal fossa measures approximately 3.1 x 3.7 x 1.2 cm and is consistent with a Baker's cyst.  IMPRESSION: No evidence of right lower extremity deep venous thrombosis. A Baker's cyst is identified measuring just over 3 cm.   Electronically Signed   By: Aletta Edouard M.D.   On: 04/17/2014 13:20     EKG Interpretation None      MDM   Final diagnoses:  Superficial bruising of thigh    Patient with bruising of her thigh. That work reassuring. Negative Doppler. No clear trauma but patient has had a steroid shot. Will discharge him no other signs of bleeding    Jasper Riling. Alvino Chapel, MD 04/17/14 [phone]

## 2014-04-17 NOTE — ED Notes (Signed)
Pain , contusion to rt lower leg. Onset 5/27, No known injury.

## 2014-04-17 NOTE — Discharge Instructions (Signed)

## 2014-04-17 NOTE — ED Notes (Signed)
Pain to right leg for one week.  Some tingling to leg on yesterday.

## 2014-04-17 NOTE — Telephone Encounter (Signed)
Patient advised to go to ed for eval of dvt or cellulitis.

## 2014-04-24 ENCOUNTER — Encounter (HOSPITAL_COMMUNITY): Payer: Medicare Other

## 2014-04-26 ENCOUNTER — Ambulatory Visit: Payer: Medicare Other | Admitting: Family Medicine

## 2014-05-02 ENCOUNTER — Other Ambulatory Visit: Payer: Self-pay | Admitting: Family Medicine

## 2014-05-06 ENCOUNTER — Other Ambulatory Visit: Payer: Self-pay | Admitting: Family Medicine

## 2014-05-16 ENCOUNTER — Encounter: Payer: Self-pay | Admitting: Family Medicine

## 2014-05-16 ENCOUNTER — Other Ambulatory Visit: Payer: Self-pay

## 2014-05-16 ENCOUNTER — Ambulatory Visit (INDEPENDENT_AMBULATORY_CARE_PROVIDER_SITE_OTHER): Payer: Medicare Other | Admitting: Family Medicine

## 2014-05-16 VITALS — BP 120/80 | HR 78 | Resp 16 | Wt 211.4 lb

## 2014-05-16 DIAGNOSIS — I2510 Atherosclerotic heart disease of native coronary artery without angina pectoris: Secondary | ICD-10-CM | POA: Diagnosis not present

## 2014-05-16 DIAGNOSIS — I10 Essential (primary) hypertension: Secondary | ICD-10-CM | POA: Diagnosis not present

## 2014-05-16 DIAGNOSIS — F17200 Nicotine dependence, unspecified, uncomplicated: Secondary | ICD-10-CM | POA: Diagnosis not present

## 2014-05-16 DIAGNOSIS — R5383 Other fatigue: Secondary | ICD-10-CM | POA: Diagnosis not present

## 2014-05-16 DIAGNOSIS — Z9181 History of falling: Secondary | ICD-10-CM

## 2014-05-16 DIAGNOSIS — F3289 Other specified depressive episodes: Secondary | ICD-10-CM

## 2014-05-16 DIAGNOSIS — E039 Hypothyroidism, unspecified: Secondary | ICD-10-CM

## 2014-05-16 DIAGNOSIS — K219 Gastro-esophageal reflux disease without esophagitis: Secondary | ICD-10-CM

## 2014-05-16 DIAGNOSIS — Z1231 Encounter for screening mammogram for malignant neoplasm of breast: Secondary | ICD-10-CM

## 2014-05-16 DIAGNOSIS — E785 Hyperlipidemia, unspecified: Secondary | ICD-10-CM | POA: Diagnosis not present

## 2014-05-16 DIAGNOSIS — R059 Cough, unspecified: Secondary | ICD-10-CM

## 2014-05-16 DIAGNOSIS — E1129 Type 2 diabetes mellitus with other diabetic kidney complication: Secondary | ICD-10-CM | POA: Diagnosis not present

## 2014-05-16 DIAGNOSIS — R05 Cough: Secondary | ICD-10-CM

## 2014-05-16 DIAGNOSIS — F329 Major depressive disorder, single episode, unspecified: Secondary | ICD-10-CM

## 2014-05-16 DIAGNOSIS — R5381 Other malaise: Secondary | ICD-10-CM | POA: Diagnosis not present

## 2014-05-16 DIAGNOSIS — R296 Repeated falls: Secondary | ICD-10-CM

## 2014-05-16 MED ORDER — BUPROPION HCL ER (XL) 300 MG PO TB24: 300.0000 mg | ORAL_TABLET | ORAL | Status: DC

## 2014-05-16 MED ORDER — RANITIDINE HCL 150 MG PO TABS: 150.0000 mg | ORAL_TABLET | Freq: Two times a day (BID) | ORAL | Status: DC

## 2014-05-16 MED ORDER — SOLIFENACIN SUCCINATE 10 MG PO TABS: ORAL_TABLET | ORAL | Status: DC

## 2014-05-16 MED ORDER — HYDROCODONE-ACETAMINOPHEN 10-325 MG PO TABS: 1.0000 | ORAL_TABLET | Freq: Four times a day (QID) | ORAL | Status: DC

## 2014-05-16 MED ORDER — SIMVASTATIN 40 MG PO TABS: 40.0000 mg | ORAL_TABLET | Freq: Every day | ORAL | Status: DC

## 2014-05-16 MED ORDER — METOPROLOL SUCCINATE ER 25 MG PO TB24: 25.0000 mg | ORAL_TABLET | Freq: Every day | ORAL | Status: DC

## 2014-05-16 MED ORDER — LEVOTHYROXINE SODIUM 75 MCG PO TABS: ORAL_TABLET | ORAL | Status: DC

## 2014-05-16 NOTE — Patient Instructions (Addendum)
Pelvic and breast in mid October, call if you need me before  Please work on smoking cessation, to improve your health  New medication for your heart , since you have CAD, metoprolol 25 mg one daily   You are referred for mammogram when due, eye exam and for chest scan to screen for lung cancer  Fasting lipid, cmp and EGFr, hBA1C , tSH in October 3 to 5 days befroe visit    Fall Prevention and Home Safety Falls cause injuries and can affect all age groups. It is possible to prevent falls.  HOW TO PREVENT FALLS  Wear shoes with rubber soles that do not have an opening for your toes.  Keep the inside and outside of your house well lit.  Use night lights throughout your home.  Remove clutter from floors.  Clean up floor spills.  Remove throw rugs or fasten them to the floor with carpet tape.  Do not place electrical cords across pathways.  Put grab bars by your tub, shower, and toilet. Do not use towel bars as grab bars.  Put handrails on both sides of the stairway. Fix loose handrails.  Do not climb on stools or stepladders, if possible.  Do not wax your floors.  Repair uneven or unsafe sidewalks, walkways, or stairs.  Keep items you use a lot within reach.  Be aware of pets.  Keep emergency numbers next to the telephone.  Put smoke detectors in your home and near bedrooms. Ask your doctor what other things you can do to prevent falls. Document Released: 08/29/2009 Document Revised: 05/03/2012 Document Reviewed: 02/02/2012 [employer] Patient Information 2015 [employer]. This information is not intended to replace advice given to you by your health care provider. Make sure you discuss any questions you have with your health care provider.  

## 2014-05-29 ENCOUNTER — Encounter (HOSPITAL_COMMUNITY): Payer: Self-pay

## 2014-05-29 ENCOUNTER — Ambulatory Visit (HOSPITAL_COMMUNITY)
Admission: RE | Admit: 2014-05-29 | Discharge: 2014-05-29 | Disposition: A | Discharge status: Home / Self Care | Payer: Medicare Other | Source: Ambulatory Visit | Attending: Family Medicine | Admitting: Family Medicine

## 2014-05-29 ENCOUNTER — Other Ambulatory Visit: Payer: Self-pay

## 2014-05-29 DIAGNOSIS — R059 Cough, unspecified: Secondary | ICD-10-CM | POA: Diagnosis not present

## 2014-05-29 DIAGNOSIS — Z87891 Personal history of nicotine dependence: Secondary | ICD-10-CM | POA: Diagnosis not present

## 2014-05-29 DIAGNOSIS — Z122 Encounter for screening for malignant neoplasm of respiratory organs: Secondary | ICD-10-CM | POA: Insufficient documentation

## 2014-05-29 DIAGNOSIS — R05 Cough: Secondary | ICD-10-CM

## 2014-05-29 DIAGNOSIS — F17200 Nicotine dependence, unspecified, uncomplicated: Secondary | ICD-10-CM

## 2014-05-29 MED ORDER — CLONAZEPAM 1 MG PO TABS: ORAL_TABLET | ORAL | Status: DC

## 2014-05-30 ENCOUNTER — Other Ambulatory Visit: Payer: Self-pay | Admitting: Family Medicine

## 2014-06-02 ENCOUNTER — Other Ambulatory Visit: Payer: Self-pay | Admitting: Family Medicine

## 2014-06-05 ENCOUNTER — Encounter (HOSPITAL_COMMUNITY): Payer: Self-pay | Admitting: Emergency Medicine

## 2014-06-05 ENCOUNTER — Emergency Department (HOSPITAL_COMMUNITY): Payer: Medicare Other

## 2014-06-05 ENCOUNTER — Emergency Department (HOSPITAL_COMMUNITY)
Admission: EM | Admit: 2014-06-05 | Discharge: 2014-06-05 | Disposition: A | Discharge status: Home / Self Care | Payer: Medicare Other | Attending: Emergency Medicine | Admitting: Emergency Medicine

## 2014-06-05 DIAGNOSIS — Z9889 Other specified postprocedural states: Secondary | ICD-10-CM | POA: Diagnosis not present

## 2014-06-05 DIAGNOSIS — Z87891 Personal history of nicotine dependence: Secondary | ICD-10-CM | POA: Diagnosis not present

## 2014-06-05 DIAGNOSIS — E785 Hyperlipidemia, unspecified: Secondary | ICD-10-CM | POA: Diagnosis not present

## 2014-06-05 DIAGNOSIS — I2510 Atherosclerotic heart disease of native coronary artery without angina pectoris: Secondary | ICD-10-CM | POA: Diagnosis not present

## 2014-06-05 DIAGNOSIS — N183 Chronic kidney disease, stage 3 unspecified: Secondary | ICD-10-CM | POA: Insufficient documentation

## 2014-06-05 DIAGNOSIS — S0993XA Unspecified injury of face, initial encounter: Secondary | ICD-10-CM | POA: Diagnosis not present

## 2014-06-05 DIAGNOSIS — Y9389 Activity, other specified: Secondary | ICD-10-CM | POA: Diagnosis not present

## 2014-06-05 DIAGNOSIS — S0990XA Unspecified injury of head, initial encounter: Secondary | ICD-10-CM

## 2014-06-05 DIAGNOSIS — E119 Type 2 diabetes mellitus without complications: Secondary | ICD-10-CM | POA: Insufficient documentation

## 2014-06-05 DIAGNOSIS — Z951 Presence of aortocoronary bypass graft: Secondary | ICD-10-CM | POA: Diagnosis not present

## 2014-06-05 DIAGNOSIS — Z791 Long term (current) use of non-steroidal anti-inflammatories (NSAID): Secondary | ICD-10-CM | POA: Insufficient documentation

## 2014-06-05 DIAGNOSIS — G8929 Other chronic pain: Secondary | ICD-10-CM | POA: Insufficient documentation

## 2014-06-05 DIAGNOSIS — K219 Gastro-esophageal reflux disease without esophagitis: Secondary | ICD-10-CM | POA: Diagnosis not present

## 2014-06-05 DIAGNOSIS — Z8669 Personal history of other diseases of the nervous system and sense organs: Secondary | ICD-10-CM | POA: Insufficient documentation

## 2014-06-05 DIAGNOSIS — Z7982 Long term (current) use of aspirin: Secondary | ICD-10-CM | POA: Insufficient documentation

## 2014-06-05 DIAGNOSIS — Y92410 Unspecified street and highway as the place of occurrence of the external cause: Secondary | ICD-10-CM | POA: Insufficient documentation

## 2014-06-05 DIAGNOSIS — IMO0002 Reserved for concepts with insufficient information to code with codable children: Secondary | ICD-10-CM | POA: Insufficient documentation

## 2014-06-05 DIAGNOSIS — I129 Hypertensive chronic kidney disease with stage 1 through stage 4 chronic kidney disease, or unspecified chronic kidney disease: Secondary | ICD-10-CM | POA: Diagnosis not present

## 2014-06-05 MED ORDER — HYDROCODONE-ACETAMINOPHEN 5-325 MG PO TABS: 1.0000 | ORAL_TABLET | Freq: Four times a day (QID) | ORAL | Status: DC | PRN
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
## 2014-06-05 NOTE — ED Notes (Signed)
NAD noted at time of d/c instruction

## 2014-06-05 NOTE — Discharge Instructions (Signed)
Concussion A concussion is a brain injury. It is caused by:  A hit to the head.  A quick and sudden movement (jolt) of the head or neck. A concussion is usually not life-threatening. Even so, it can cause serious problems. If you had a concussion before, you may have concussion-like problems after a hit to your head. HOME CARE General Instructions  Follow your doctor's directions carefully.  Take medicines only as told by your doctor.  Only take medicines your doctor says are safe.  Do not drink alcohol until your doctor says it is okay. Alcohol and some drugs can slow down healing. They can also put you at risk for further injury.  If you are having trouble remembering things, write them down.  Try to do one thing at a time if you get distracted easily. For example, do not watch TV while making dinner.  Talk to your family members or close friends when making important decisions.  Follow up with your doctor as told.  Watch your symptoms. Tell others to do the same. Serious problems can sometimes happen after a concussion. Older adults are more likely to have these problems.  Tell your teachers, school nurse, school counselor, coach, Product/process development scientist, or work Freight forwarder about your concussion. Tell them about what you can or cannot do. They should watch to see if:  It gets even harder for you to pay attention or concentrate.  It gets even harder for you to remember things or learn new things.  You need more time than normal to finish things.  You become annoyed (irritable) more than before.  You are not able to deal with stress as well.  You have more problems than before.  Rest. Make sure you:  Get plenty of sleep at night.  Go to sleep early.  Go to bed at the same time every day. Try to wake up at the same time.  Rest during the day.  Take naps when you feel tired.  Limit activities where you have to think a lot or concentrate. These include:  Doing  homework.  Doing work related to a job.  Watching TV.  Using the computer. Returning To Your Regular Activities Return to your normal activities slowly, not all at once. You must give your body and brain enough time to heal.   Do not play sports or do other athletic activities until your doctor says it is okay.  Ask your doctor when you can drive, ride a bicycle, or work other vehicles or machines. Never do these things if you feel dizzy.  Ask your doctor about when you can return to work or school. Preventing Another Concussion It is very important to avoid another brain injury, especially before you have healed. In rare cases, another injury can lead to permanent brain damage, brain swelling, or death. The risk of this is greatest during the first 7-10 days after your injury. Avoid injuries by:   Wearing a seat belt when riding in a car.  Not drinking too much alcohol.  Avoiding activities that could lead to a second concussion (such as contact sports).  Wearing a helmet when doing activities like:  Biking.  Skiing.  Skateboarding.  Skating.  Making your home safer by:  Removing things from the floor or stairways that could make you trip.  Using grab bars in bathrooms and handrails by stairs.  Placing non-slip mats on floors and in bathtubs.  Improve lighting in dark areas. GET HELP IF:  It gets  even harder for you to pay attention or concentrate.  It gets even harder for you to remember things or learn new things.  You need more time than normal to finish things.  You become annoyed (irritable) more than before.  You are not able to deal with stress as well.  You have more problems than before.  You have problems keeping your balance.  You are not able to react quickly when you should. Get help if you have any of these problems for more than 2 weeks:   Lasting (chronic) headaches.  Dizziness or trouble balancing.  Feeling sick to your stomach  (nausea).  Seeing (vision) problems.  Being affected by noises or light more than normal.  Feeling sad, low, down in the dumps, blue, gloomy, or empty (depressed).  Mood changes (mood swings).  Feeling of fear or nervousness about what may happen (anxiety).  Feeling annoyed.  Memory problems.  Problems concentrating or paying attention.  Sleep problems.  Feeling tired all the time. GET HELP RIGHT AWAY IF:   You have bad headaches or your headaches get worse.  You have weakness (even if it is in one hand, leg, or part of the face).  You have loss of feeling (numbness).  You feel off balance.  You keep throwing up (vomiting).  You feel tired.  One black center of your eye (pupil) is larger than the other.  You twitch or shake violently (convulse).  Your speech is not clear (slurred).  You are more confused, easily angered (agitated), or annoyed than before.  You have more trouble resting than before.  You are unable to recognize people or places.  You have neck pain.  It is difficult to wake you up.  You have unusual behavior changes.  You pass out (lose consciousness). MAKE SURE YOU:   Understand these instructions.  Will watch your condition.  Will get help right away if you are not doing well or get worse. Document Released: 10/21/2009 Document Revised: 11/07/2013 Document Reviewed: 05/25/2013 [facility] Patient Information 2015 [place], [place]. This information is not intended to replace advice given to you by your health care provider. Make sure you discuss any questions you have with your health care provider.

## 2014-06-15 ENCOUNTER — Other Ambulatory Visit: Payer: Self-pay

## 2014-06-15 MED ORDER — HYDROCODONE-ACETAMINOPHEN 10-325 MG PO TABS: 1.0000 | ORAL_TABLET | Freq: Four times a day (QID) | ORAL | Status: DC

## 2014-07-11 DIAGNOSIS — E119 Type 2 diabetes mellitus without complications: Secondary | ICD-10-CM | POA: Diagnosis not present

## 2014-07-12 ENCOUNTER — Other Ambulatory Visit: Payer: Self-pay

## 2014-07-12 MED ORDER — HYDROCODONE-ACETAMINOPHEN 10-325 MG PO TABS: 1.0000 | ORAL_TABLET | Freq: Four times a day (QID) | ORAL | Status: DC

## 2014-07-18 LAB — HM DIABETES EYE EXAM

## 2014-08-02 ENCOUNTER — Other Ambulatory Visit: Payer: Self-pay

## 2014-08-02 MED ORDER — CHOLINE FENOFIBRATE 45 MG PO CPDR: 45.0000 mg | DELAYED_RELEASE_CAPSULE | Freq: Every day | ORAL | Status: DC

## 2014-08-02 MED ORDER — DULOXETINE HCL 60 MG PO CPEP: 60.0000 mg | ORAL_CAPSULE | Freq: Two times a day (BID) | ORAL | Status: DC

## 2014-08-02 MED ORDER — LEVOTHYROXINE SODIUM 75 MCG PO TABS: ORAL_TABLET | ORAL | Status: DC

## 2014-08-02 MED ORDER — RANITIDINE HCL 150 MG PO TABS: 150.0000 mg | ORAL_TABLET | Freq: Two times a day (BID) | ORAL | Status: DC

## 2014-08-02 MED ORDER — SIMVASTATIN 40 MG PO TABS: 40.0000 mg | ORAL_TABLET | Freq: Every day | ORAL | Status: DC

## 2014-08-03 ENCOUNTER — Other Ambulatory Visit: Payer: Self-pay

## 2014-08-17 ENCOUNTER — Other Ambulatory Visit: Payer: Self-pay

## 2014-08-17 DIAGNOSIS — E785 Hyperlipidemia, unspecified: Secondary | ICD-10-CM | POA: Diagnosis not present

## 2014-08-17 DIAGNOSIS — I10 Essential (primary) hypertension: Secondary | ICD-10-CM | POA: Diagnosis not present

## 2014-08-17 DIAGNOSIS — E1129 Type 2 diabetes mellitus with other diabetic kidney complication: Secondary | ICD-10-CM | POA: Diagnosis not present

## 2014-08-17 LAB — LIPID PANEL
CHOL/HDL RATIO: 4.7 ratio
Cholesterol: 173 mg/dL (ref 0–200)
HDL: 37 mg/dL — ABNORMAL LOW (ref >39)
LDL Cholesterol: 92 mg/dL (ref 0–99)
Triglycerides: 220 mg/dL — ABNORMAL HIGH (ref <150)
VLDL: 44 mg/dL — AB (ref 0–40)

## 2014-08-17 LAB — COMPLETE METABOLIC PANEL WITH GFR
ALT: <8 U/L (ref 0–35)
AST: 14 U/L (ref 0–37)
Albumin: 3.9 g/dL (ref 3.5–5.2)
Alkaline Phosphatase: 46 U/L (ref 39–117)
BILIRUBIN TOTAL: 0.4 mg/dL (ref 0.2–1.2)
BUN: 41 mg/dL — ABNORMAL HIGH (ref 6–23)
CO2: 25 mEq/L (ref 19–32)
Calcium: 10 mg/dL (ref 8.4–10.5)
Chloride: 99 mEq/L (ref 96–112)
Creat: 2.13 mg/dL — ABNORMAL HIGH (ref 0.50–1.10)
GFR, EST NON AFRICAN AMERICAN: 23 mL/min — AB
GFR, Est African American: 27 mL/min — ABNORMAL LOW
GLUCOSE: 129 mg/dL — AB (ref 70–99)
Potassium: 5.3 mEq/L (ref 3.5–5.3)
SODIUM: 135 meq/L (ref 135–145)
Total Protein: 6.6 g/dL (ref 6.0–8.3)

## 2014-08-17 LAB — HEMOGLOBIN A1C
Hgb A1c MFr Bld: 6.9 % — ABNORMAL HIGH (ref <5.7)
Mean Plasma Glucose: 151 mg/dL — ABNORMAL HIGH (ref <117)

## 2014-08-17 LAB — TSH: TSH: 1.542 u[IU]/mL (ref 0.350–4.500)

## 2014-08-17 MED ORDER — HYDROCODONE-ACETAMINOPHEN 10-325 MG PO TABS
1.0000 | ORAL_TABLET | Freq: Four times a day (QID) | ORAL | Status: DC
Start: 2014-08-17 — End: 2014-08-22

## 2014-08-22 ENCOUNTER — Other Ambulatory Visit: Payer: Self-pay

## 2014-08-22 MED ORDER — HYDROCODONE-ACETAMINOPHEN 10-325 MG PO TABS: 1.0000 | ORAL_TABLET | Freq: Four times a day (QID) | ORAL | Status: DC

## 2014-08-23 ENCOUNTER — Other Ambulatory Visit: Payer: Self-pay | Admitting: Family Medicine

## 2014-08-23 ENCOUNTER — Other Ambulatory Visit: Payer: Self-pay

## 2014-08-23 DIAGNOSIS — Z1231 Encounter for screening mammogram for malignant neoplasm of breast: Secondary | ICD-10-CM

## 2014-08-23 MED ORDER — CLONAZEPAM 1 MG PO TABS: 1.0000 mg | ORAL_TABLET | Freq: Three times a day (TID) | ORAL | Status: DC | PRN

## 2014-08-31 ENCOUNTER — Ambulatory Visit (HOSPITAL_COMMUNITY)
Admission: RE | Admit: 2014-08-31 | Discharge: 2014-08-31 | Disposition: A | Discharge status: Home / Self Care | Payer: Medicare Other | Source: Ambulatory Visit | Attending: Family Medicine | Admitting: Family Medicine

## 2014-08-31 DIAGNOSIS — Z1231 Encounter for screening mammogram for malignant neoplasm of breast: Secondary | ICD-10-CM | POA: Diagnosis not present

## 2014-09-03 ENCOUNTER — Encounter: Payer: Self-pay | Admitting: Family Medicine

## 2014-09-03 ENCOUNTER — Other Ambulatory Visit: Payer: Self-pay

## 2014-09-03 ENCOUNTER — Other Ambulatory Visit (HOSPITAL_COMMUNITY)
Admission: RE | Admit: 2014-09-03 | Discharge: 2014-09-03 | Disposition: A | Discharge status: Home / Self Care | Payer: Medicare Other | Source: Ambulatory Visit | Attending: Family Medicine | Admitting: Family Medicine

## 2014-09-03 ENCOUNTER — Ambulatory Visit (INDEPENDENT_AMBULATORY_CARE_PROVIDER_SITE_OTHER): Payer: Medicare Other | Admitting: Family Medicine

## 2014-09-03 VITALS — BP 130/70 | HR 69 | Resp 16 | Ht 65.0 in | Wt 212.1 lb

## 2014-09-03 DIAGNOSIS — I10 Essential (primary) hypertension: Secondary | ICD-10-CM

## 2014-09-03 DIAGNOSIS — E1122 Type 2 diabetes mellitus with diabetic chronic kidney disease: Secondary | ICD-10-CM

## 2014-09-03 DIAGNOSIS — Z1211 Encounter for screening for malignant neoplasm of colon: Secondary | ICD-10-CM | POA: Diagnosis not present

## 2014-09-03 DIAGNOSIS — L97511 Non-pressure chronic ulcer of other part of right foot limited to breakdown of skin: Secondary | ICD-10-CM

## 2014-09-03 DIAGNOSIS — Z1239 Encounter for other screening for malignant neoplasm of breast: Secondary | ICD-10-CM

## 2014-09-03 DIAGNOSIS — Z124 Encounter for screening for malignant neoplasm of cervix: Secondary | ICD-10-CM | POA: Insufficient documentation

## 2014-09-03 DIAGNOSIS — Z0000 Encounter for general adult medical examination without abnormal findings: Secondary | ICD-10-CM

## 2014-09-03 DIAGNOSIS — Z23 Encounter for immunization: Secondary | ICD-10-CM | POA: Insufficient documentation

## 2014-09-03 DIAGNOSIS — L97519 Non-pressure chronic ulcer of other part of right foot with unspecified severity: Secondary | ICD-10-CM | POA: Insufficient documentation

## 2014-09-03 DIAGNOSIS — E785 Hyperlipidemia, unspecified: Secondary | ICD-10-CM

## 2014-09-03 LAB — HEMOCCULT GUIAC POC 1CARD (OFFICE): Fecal Occult Blood, POC: NEGATIVE

## 2014-09-03 MED ORDER — CEPHALEXIN 500 MG PO CAPS: 500.0000 mg | ORAL_CAPSULE | Freq: Three times a day (TID) | ORAL | Status: DC

## 2014-09-03 NOTE — Assessment & Plan Note (Signed)
Pelvic and breast exam as documented  Counseling done  re healthy lifestyle involving commitment to 150 minutes exercise per week, heart healthy diet, and attaining healthy weight.The importance of adequate sleep also discussed. Regular seat belt use and home safety, is also discussed.  Immunization and cancer screening needs are specifically addressed at this visit.

## 2014-09-03 NOTE — Progress Notes (Signed)
   Subjective:    Patient ID: Tammy Suarez, female    DOB: 03/26/1946, 68 y.o.   MRN: [id number]  HPI Patient is in for pelvic and breast exam. Diabetic foot exam done at this visit. Pt has ulcer on left 2nd toe, no obvious infection  Tetanus is UTD Review of Systems See HPI     Objective:   Physical Exam BP 130/70  Pulse 69  Resp 16  Ht 5\' 5"  (1.651 m)  Wt 212 lb 1.9 oz (96.217 kg)  BMI 35.30 kg/m2  SpO2 95% .Pleasant obese female, alert and oriented x 3, in no cardio-pulmonary distress. Afebrile. .  Breast: No asymetry,no masses or lumps. No tenderness. No nipple discharge or inversion. No axillary or supraclavicular adenopathy    Rectal:  Normal sphincter tone. No mass.No rectal masses.  Guaiac negative stool.  GU: External genitalia normal female genitalia , female distribution of hair. No lesions. Urethral meatus normal in size, no  Prolapse, no lesions visibly  Present. Bladder non tender. Vagina pink and moist , with no visible lesions , discharge present . Adequate pelvic support no  cystocele or rectocele noted Uterus absent, no adnexal masses, no adnexal tenderness.         Assessment & Plan:  Ulcer of toe of right foot Second toe of right foot with skin break down, no purulent drainage, no erythema of skin, pt has script for keflex to hold in the event of super infection     PE (physical exam), annual Pelvic and breast exam as documented  Counseling done  re healthy lifestyle involving commitment to 150 minutes exercise per week, heart healthy diet, and attaining healthy weight.The importance of adequate sleep also discussed. Regular seat belt use and home safety, is also discussed.  Immunization and cancer screening needs are specifically addressed at this visit.   Need for prophylactic vaccination and inoculation against influenza Vaccine administered at visit.

## 2014-09-03 NOTE — Assessment & Plan Note (Signed)
Vaccine administered at visit.  

## 2014-09-03 NOTE — Assessment & Plan Note (Addendum)
Second toe of right foot with skin break down, no purulent drainage, no erythema of skin, pt has script for keflex to hold in the event of super infection

## 2014-09-03 NOTE — Patient Instructions (Addendum)
F/u Nov 2 for follow up of ulcer on toe, nurse visit  F/U with MD in 3.5 months Flu vaccine today  You do need and qualify for diabetic shoes, go to [place] for these or [facility], you will get the script today.  For non healing ulcer on the toe, you need to keep the area clean and dry. If you develop pain and drainage you need to start the antibiotic keflex which I will give you a script for and call and let us know. At that point  I will refer you to physical therapy for wound care also.  Non fasting  chem 7 and eGFR, and microalb on 09/17/2014  Fasting lipid, cmp and eGFR and hBa1C and TSH in 3.5 months, PLS drink water for fasting labs  Use daily stool softener to help with constipation from pain meds  Work on smoking cessation, you need to quit!

## 2014-09-05 LAB — CYTOLOGY - PAP

## 2014-09-13 ENCOUNTER — Encounter: Payer: Self-pay | Admitting: Family Medicine

## 2014-09-13 NOTE — Assessment & Plan Note (Signed)
Antibiotic and decongestant prescribed 

## 2014-09-13 NOTE — Assessment & Plan Note (Signed)
Flare of uncontrolled pain injections in office

## 2014-09-13 NOTE — Assessment & Plan Note (Addendum)
UnControlled, no change in medication, systolic slightly elevated, however pt in pain and experiencing excessive cough No med change at this time

## 2014-09-13 NOTE — Assessment & Plan Note (Signed)
Increased and uncontrolled due to recent unexpected death of her daughter. Not suicidal or homicidal , no interest in therapy at this  time

## 2014-09-13 NOTE — Assessment & Plan Note (Signed)
Controlled, no change in medication  

## 2014-09-13 NOTE — Progress Notes (Signed)
   Subjective:    Patient ID: Tammy Suarez, female    DOB: 09-14-1946, 68 y.o.   MRN: [id number]  HPI 2 week h/o increased chest congestion with cough productive of thick sputum and chills. Denies head congestion sore throat or ear pain, no documented fever 1 week h/o of increased  Low back pain radiating down legs, no specific trigger known, no new incontinence , weakness or numbness. Recently her daughter passed unexpectedly causing increased depression and anxiety, acute grief reaction is appropriate, no interest in or need for therapy currently, not suicidal or homicidal    Review of Systems See HPI Denies sinus pressure, nasal congestion, ear pain or sore throat.  Denies chest pains, palpitations and leg swelling Denies abdominal pain, nausea, vomiting,diarrhea or constipation.   Denies dysuria, frequency, hesitancy or incontinence.  Denies headaches, seizures, . Denies skin break down or rash.        Objective:   Physical Exam BP 140/80  Pulse 82  Resp 16  Wt 214 lb 6.4 oz (97.251 kg)  SpO2 95% Patient alert and oriented excessive cough , and in pain.  HEENT: No facial asymmetry, EOMI,   oropharynx pink and moist.  Neck supple no JVD, no mass.  Chest: decreased air entry, bilateral crackles and few wheezes  CVS: S1, S2 no murmurs, no S3.Regular rate.  ABD: Soft non tender.   Ext: No edema  MS: decreased TOM lumbar spine, normal ROM shoulders, hips and knees  Skin: Intact, no ulcerations or rash noted.  Psych: Good eye contact, tearful affect. Memory intact  depressed appearing.  CNS: CN 2-12 intact, power,  normal throughout.no focal deficits noted.        Assessment & Plan:  Bronchitis, chronic obstructive w acute bronchitis Antibiotic and decongestant prescribed  Back pain with radiation Flare of uncontrolled pain injections in office  Essential hypertension, benign UnControlled, no change in medication, systolic slightly elevated, however pt  in pain and experiencing excessive cough No med change at this time   Depression with anxiety Increased and uncontrolled due to recent unexpected death of her daughter. Not suicidal or homicidal , no interest in therapy at this  time  Hypothyroidism Controlled, no change in medication   DM (diabetes mellitus), type 2 with renal complications Controlled, no change in management, which is diet controlled

## 2014-09-13 NOTE — Assessment & Plan Note (Signed)
Controlled, no change in management, which is diet controlled

## 2014-09-17 DIAGNOSIS — E1122 Type 2 diabetes mellitus with diabetic chronic kidney disease: Secondary | ICD-10-CM | POA: Diagnosis not present

## 2014-09-17 DIAGNOSIS — N189 Chronic kidney disease, unspecified: Secondary | ICD-10-CM | POA: Diagnosis not present

## 2014-09-17 DIAGNOSIS — E1129 Type 2 diabetes mellitus with other diabetic kidney complication: Secondary | ICD-10-CM | POA: Diagnosis not present

## 2014-09-18 LAB — BASIC METABOLIC PANEL WITH GFR
BUN: 35 mg/dL — ABNORMAL HIGH (ref 6–23)
CO2: 25 mEq/L (ref 19–32)
CREATININE: 1.8 mg/dL — AB (ref 0.50–1.10)
Calcium: 9.6 mg/dL (ref 8.4–10.5)
Chloride: 95 mEq/L — ABNORMAL LOW (ref 96–112)
GFR, Est African American: 33 mL/min — ABNORMAL LOW
GFR, Est Non African American: 29 mL/min — ABNORMAL LOW
GLUCOSE: 190 mg/dL — AB (ref 70–99)
Potassium: 5.1 mEq/L (ref 3.5–5.3)
Sodium: 133 mEq/L — ABNORMAL LOW (ref 135–145)

## 2014-09-18 LAB — MICROALBUMIN / CREATININE URINE RATIO
Creatinine, Urine: 143.2 mg/dL
Microalb Creat Ratio: 28.6 mg/g (ref 0.0–30.0)
Microalb, Ur: 4.1 mg/dL — ABNORMAL HIGH (ref <2.0)

## 2014-09-23 NOTE — Assessment & Plan Note (Signed)
Ongoing nicotine use, unwilling to set a quit date currently but trying to reduce dependence Patient counseled for approximately 5 minutes regarding the health risks of ongoing nicotine use, specifically all types of cancer, heart disease, stroke and respiratory failure. The options available for help with cessation ,the behavioral changes to assist the process, and the option to either gradully reduce usage  Or abruptly stop.is also discussed. Pt is also encouraged to set specific goals in number of cigarettes used daily, as well as to set a quit date.'

## 2014-09-23 NOTE — Progress Notes (Signed)
Subjective:    Patient ID: Tammy Suarez, female    DOB: January 29, 1946, 68 y.o.   MRN: [id number]  HPI The PT is here for follow up and re-evaluation of chronic medical conditions, medication management and review of any available recent lab and radiology data.  Preventive health is updated, specifically  Cancer screening and Immunization.   Questions or concerns regarding consultations or procedures which the PT has had in the interim are  addressed. The PT denies any adverse reactions to current medications since the last visit.  There are no new concerns.  There are no specific complaints  Denies polyuria, polydipsia, blurred vision , or hypoglycemic episodes.      Review of Systems See HPI Denies recent fever or chills. Denies sinus pressure, nasal congestion, ear pain or sore throat. Denies chest congestion, productive cough or wheezing. Denies chest pains, palpitations and leg swelling Denies abdominal pain, nausea, vomiting,diarrhea or constipation.   Denies dysuria, frequency, hesitancy or incontinence. Denies uncontrolled  joint pain, swelling and limitation in mobility. Denies headaches, seizures, numbness, or tingling. Denies uncontrolled  depression, anxiety or insomnia. Denies skin break down or rash.        Objective:   Physical Exam BP 120/80 mmHg  Pulse 78  Resp 16  Wt 211 lb 6.4 oz (95.89 kg)  SpO2 97% Patient alert and oriented and in no cardiopulmonary distress.  HEENT: No facial asymmetry, EOMI,   oropharynx pink and moist.  Neck supple no JVD, no mass.  Chest: Clear to auscultation bilaterally.Decfreased throughout  CVS: S1, S2 no murmurs, no S3.Regular rate.  ABD: Soft non tender.   Ext: No edema  MS: Adequate though reduced  ROM spine, shoulders, hips and knees.  Skin: Intact, no ulcerations or rash noted.  Psych: Good eye contact, normal affect. Memory intact not anxious or depressed appearing.  CNS: CN 2-12 intact, power,  normal  throughout.no focal deficits noted.        Assessment & Plan:  Essential hypertension, benign Controlled, no change in medication DASH diet and commitment to daily physical activity for a minimum of 30 minutes discussed and encouraged, as a part of hypertension management. The importance of attaining a healthy weight is also discussed.   GERD Controlled, no change in medication   DM (diabetes mellitus), type 2 with renal complications Controlled, no change in medication Patient educated about the importance of limiting  Carbohydrate intake , the need to commit to daily physical activity for a minimum of 30 minutes , and to commit weight loss. The fact that changes in all these areas will reduce or eliminate all together thedneed to take medication for her illness is stressed    Hypothyroidism Controlled, no change in medication Updated lab needed at/ before next visit.   Nicotine dependence Ongoing nicotine use, unwilling to set a quit date currently but trying to reduce dependence Patient counseled for approximately 5 minutes regarding the health risks of ongoing nicotine use, specifically all types of cancer, heart disease, stroke and respiratory failure. The options available for help with cessation ,the behavioral changes to assist the process, and the option to either gradully reduce usage  Or abruptly stop.is also discussed. Pt is also encouraged to set specific goals in number of cigarettes used daily, as well as to set a quit date.'   Recurrent falls Reduction in fall risk and safety in the home discussed , and literature  provided on the discharge instruction sheet as well.

## 2014-09-23 NOTE — Assessment & Plan Note (Signed)
Controlled, no change in medication Patient educated about the importance of limiting  Carbohydrate intake , the need to commit to daily physical activity for a minimum of 30 minutes , and to commit weight loss. The fact that changes in all these areas will reduce or eliminate all together thedneed to take medication for her illness is stressed

## 2014-09-23 NOTE — Assessment & Plan Note (Signed)
Controlled, no change in medication Updated lab needed at/ before next visit.  

## 2014-09-23 NOTE — Assessment & Plan Note (Signed)
Reduction in fall risk and safety in the home discussed , and literature  provided on the discharge instruction sheet as well.  

## 2014-09-23 NOTE — Assessment & Plan Note (Signed)
Controlled, no change in medication DASH diet and commitment to daily physical activity for a minimum of 30 minutes discussed and encouraged, as a part of hypertension management. The importance of attaining a healthy weight is also discussed.  

## 2014-09-23 NOTE — Assessment & Plan Note (Signed)
Controlled, no change in medication  

## 2014-10-05 ENCOUNTER — Ambulatory Visit (INDEPENDENT_AMBULATORY_CARE_PROVIDER_SITE_OTHER): Payer: Medicare Other | Admitting: Cardiology

## 2014-10-05 ENCOUNTER — Encounter: Payer: Self-pay | Admitting: Cardiology

## 2014-10-05 VITALS — BP 138/70 | HR 65 | Ht 65.0 in | Wt 211.0 lb

## 2014-10-05 DIAGNOSIS — E785 Hyperlipidemia, unspecified: Secondary | ICD-10-CM

## 2014-10-05 DIAGNOSIS — I10 Essential (primary) hypertension: Secondary | ICD-10-CM | POA: Diagnosis not present

## 2014-10-05 DIAGNOSIS — I2510 Atherosclerotic heart disease of native coronary artery without angina pectoris: Secondary | ICD-10-CM

## 2014-10-05 MED ORDER — NITROGLYCERIN 0.4 MG SL SUBL: 0.4000 mg | SUBLINGUAL_TABLET | SUBLINGUAL | Status: DC | PRN

## 2014-10-05 NOTE — Assessment & Plan Note (Signed)
Blood pressure is reasonably well controlled today.

## 2014-10-05 NOTE — Assessment & Plan Note (Signed)
Symptomatically stable on medical therapy status post CABG in December 2014. No changes were made today. Prescription for as needed nitroglycerin provided. Follow-up in 6 months.

## 2014-10-05 NOTE — Patient Instructions (Signed)
Your physician wants you to follow-up in: 6 months You will receive a reminder letter in the mail two months in advance. If you don't receive a letter, please call our office to schedule the follow-up appointment.     Your physician recommends that you continue on your current medications as directed. Please refer to the Current Medication list given to you today.      Thank you for choosing [facility] !        

## 2014-10-05 NOTE — Progress Notes (Signed)
Reason for visit: CAD, hypertension, hyperlipidemia  Clinical Summary Ms. Fleszar is a 68 y.o.female last seen in May. She presents for a follow-up visit. She has had no angina symptoms, NYHA class II dyspnea which is stable. We reviewed her medications which are outlined below. She continues on aspirin, beta blocker, and statin. ECG today shows normal sinus rhythm with nonspecific ST changes.  Recent lab work showed potassium 5.1, BUN 35, creatinine 1.8 which was down from 2.1, cholesterol 171, triglycerides 310, HDL 28, LDL 81.  Echocardiogram from December 2014 showed LVEF 55-60% with no regional wall motion abnormalities, diastolic dysfunction, no major valvular abnormalities.   Allergies  Allergen Reactions  . Meperidine Hcl Nausea And Vomiting  . Niacin Other (See Comments)    REACTION: face peeling and burning  . Propoxyphene N-Acetaminophen Nausea And Vomiting    Current Outpatient Prescriptions  Medication Sig Dispense Refill  . amitriptyline (ELAVIL) 25 MG tablet Take 25 mg by mouth at bedtime.    Marland Kitchen aspirin EC 81 MG tablet Take 81 mg by mouth daily.    . Aspirin-Acetaminophen-Caffeine (GOODY HEADACHE PO) Take 1 Package by mouth 2 (two) times daily as needed (headache).    Marland Kitchen buPROPion (WELLBUTRIN XL) 300 MG 24 hr tablet Take 1 tablet (300 mg total) by mouth every morning. 90 tablet 1  . cephALEXin (KEFLEX) 500 MG capsule Take 1 capsule (500 mg total) by mouth 3 (three) times daily. 21 capsule 0  . Choline Fenofibrate 45 MG capsule Take 1 capsule (45 mg total) by mouth daily. 90 capsule 1  . clonazePAM (KLONOPIN) 1 MG tablet Take 1 tablet (1 mg total) by mouth 3 (three) times daily as needed for anxiety. 90 tablet 2  . DULoxetine (CYMBALTA) 60 MG capsule Take 1 capsule (60 mg total) by mouth 2 (two) times daily. 180 capsule 1  . HYDROcodone-acetaminophen (NORCO) 10-325 MG per tablet Take 1 tablet by mouth 4 (four) times daily. 120 tablet 0  . levothyroxine (SYNTHROID,  LEVOTHROID) 75 MCG tablet TAKE 1 TABLET BY MOUTH EVERY DAY *DOSE CHANGE* 90 tablet 1  . loratadine (CLARITIN) 10 MG tablet Take 10 mg by mouth daily as needed for allergies.    . metoprolol succinate (TOPROL-XL) 25 MG 24 hr tablet Take 25 mg by mouth daily.    . Multiple Vitamin (MULTIVITAMIN) tablet Take 1 tablet by mouth daily.    . Omega-3 Fatty Acids (FISH OIL PO) Take 1 capsule by mouth daily.    . ranitidine (ZANTAC) 150 MG tablet Take 1 tablet (150 mg total) by mouth 2 (two) times daily. 180 tablet 1  . simvastatin (ZOCOR) 40 MG tablet Take 1 tablet (40 mg total) by mouth at bedtime. 90 tablet 1  . solifenacin (VESICARE) 10 MG tablet TAKE 1 TABLET BY MOUTH EVERY DAY 90 tablet 1  . nitroGLYCERIN (NITROSTAT) 0.4 MG SL tablet Place 1 tablet (0.4 mg total) under the tongue every 5 (five) minutes as needed for chest pain. 25 tablet 3   No current facility-administered medications for this visit.    Past Medical History  Diagnosis Date  . Vitamin B 12 deficiency   . Chronic back pain   . GERD (gastroesophageal reflux disease)   . Hyperlipidemia   . Obesity   . Essential hypertension, benign   . Type 2 diabetes mellitus without complications   . History of conjunctivitis   . Coronary atherosclerosis of native coronary artery     Multivessel status post CABG - LIMA to LAD,  SVG to OM and SVG to PDA  . CKD (chronic kidney disease) stage 3, GFR 30-59 ml/min     Past Surgical History  Procedure Laterality Date  . Cervical laminectomy    . Cholecystectomy    . Inguinal herniorrhaphy    . Vesicovaginal fistula closure w/ tah    . Lumbar fusion    . Left index finger repair    . Laparoscopic gastric banding  2009  . Cosmetic surgery  07/01/2012    Recurrent skin infection on lower abdomen, [facility]  . Abdominal hysterectomy    . Spine surgery    . Coronary artery bypass graft N/A 11/02/2013    Procedure: CORONARY ARTERY BYPASS GRAFTING (CABG);  Surgeon: Gaye Pollack, MD;  Location:  [place];  Service: Open Heart Surgery;  Laterality: N/A;  CABG x three, using left internal mammary artery and right leg greater saphenous vein harvested endoscopically  . Intraoperative transesophageal echocardiogram N/A 11/02/2013    Procedure: INTRAOPERATIVE TRANSESOPHAGEAL ECHOCARDIOGRAM;  Surgeon: Gaye Pollack, MD;  Location: [facility] OR;  Service: Open Heart Surgery;  Laterality: N/A;  . Hernia repair    . Back surgery    . Cardiac surgery      Social History Ms. Stroble reports that she has been smoking Cigarettes and Cigars.  She has been smoking about 1.00 pack per day. She has never used smokeless tobacco. Ms. Vineyard reports that she does not drink alcohol.  Review of Systems Complete review of systems negative except as otherwise outlined in the clinical summary.  Physical Examination Filed Vitals:   10/05/14 1037  BP: 138/70  Pulse: 65   Filed Weights   10/05/14 1037  Weight: 211 lb (95.709 kg)    Overweight woman, appears comfortable at rest.  HEENT: Conjunctiva and lids normal, oropharynx clear.  Neck: Supple, no elevated JVP or carotid bruits, no thyromegaly.  Lungs: Clear to auscultation, nonlabored breathing at rest.  Cardiac: Regular rate and rhythm, no S3 or significant systolic murmur, no pericardial rub.  Abdomen: Soft, nontender, bowel sounds present, no guarding or rebound.  Extremities: No pitting edema, distal pulses 2+.    Problem List and Plan   Coronary atherosclerosis of native coronary artery Symptomatically stable on medical therapy status post CABG in December 2014. No changes were made today. Prescription for as needed nitroglycerin provided. Follow-up in 6 months.  Essential hypertension, benign Blood pressure is reasonably well controlled today.  Hyperlipidemia She continues on Zocor, recent LDL 81.    Satira Sark, M.D., F.A.C.C.

## 2014-10-05 NOTE — Assessment & Plan Note (Signed)
She continues on Zocor, recent LDL 81.

## 2014-10-10 ENCOUNTER — Other Ambulatory Visit: Payer: Self-pay

## 2014-10-10 MED ORDER — HYDROCODONE-ACETAMINOPHEN 10-325 MG PO TABS: 1.0000 | ORAL_TABLET | Freq: Four times a day (QID) | ORAL | Status: DC

## 2014-10-25 ENCOUNTER — Encounter (HOSPITAL_COMMUNITY): Payer: Self-pay | Admitting: Cardiology

## 2014-11-02 ENCOUNTER — Other Ambulatory Visit: Payer: Self-pay

## 2014-11-02 MED ORDER — HYDROCODONE-ACETAMINOPHEN 10-325 MG PO TABS: 1.0000 | ORAL_TABLET | Freq: Four times a day (QID) | ORAL | Status: DC

## 2014-11-11 ENCOUNTER — Other Ambulatory Visit: Payer: Self-pay | Admitting: Family Medicine

## 2014-11-19 DIAGNOSIS — E785 Hyperlipidemia, unspecified: Secondary | ICD-10-CM | POA: Diagnosis not present

## 2014-11-19 DIAGNOSIS — I10 Essential (primary) hypertension: Secondary | ICD-10-CM | POA: Diagnosis not present

## 2014-11-19 DIAGNOSIS — N189 Chronic kidney disease, unspecified: Secondary | ICD-10-CM | POA: Diagnosis not present

## 2014-11-19 DIAGNOSIS — E1122 Type 2 diabetes mellitus with diabetic chronic kidney disease: Secondary | ICD-10-CM | POA: Diagnosis not present

## 2014-11-20 LAB — COMPLETE METABOLIC PANEL WITH GFR
ALT: 9 U/L (ref 0–35)
AST: 15 U/L (ref 0–37)
Albumin: 3.8 g/dL (ref 3.5–5.2)
Alkaline Phosphatase: 42 U/L (ref 39–117)
BUN: 28 mg/dL — ABNORMAL HIGH (ref 6–23)
CALCIUM: 9.4 mg/dL (ref 8.4–10.5)
CO2: 27 mEq/L (ref 19–32)
CREATININE: 1.57 mg/dL — AB (ref 0.50–1.10)
Chloride: 101 mEq/L (ref 96–112)
GFR, EST NON AFRICAN AMERICAN: 34 mL/min — AB
GFR, Est African American: 39 mL/min — ABNORMAL LOW
Glucose, Bld: 123 mg/dL — ABNORMAL HIGH (ref 70–99)
POTASSIUM: 4.9 meq/L (ref 3.5–5.3)
SODIUM: 136 meq/L (ref 135–145)
Total Bilirubin: 0.4 mg/dL (ref 0.2–1.2)
Total Protein: 6.4 g/dL (ref 6.0–8.3)

## 2014-11-20 LAB — HEMOGLOBIN A1C
Hgb A1c MFr Bld: 6.8 % — ABNORMAL HIGH (ref <5.7)
Mean Plasma Glucose: 148 mg/dL — ABNORMAL HIGH (ref <117)

## 2014-11-20 LAB — LIPID PANEL
Cholesterol: 149 mg/dL (ref 0–200)
HDL: 39 mg/dL — ABNORMAL LOW (ref >39)
LDL CALC: 80 mg/dL (ref 0–99)
Total CHOL/HDL Ratio: 3.8 Ratio
Triglycerides: 150 mg/dL — ABNORMAL HIGH (ref <150)
VLDL: 30 mg/dL (ref 0–40)

## 2014-11-20 LAB — TSH: TSH: 3.837 u[IU]/mL (ref 0.350–4.500)

## 2014-11-23 ENCOUNTER — Other Ambulatory Visit: Payer: Self-pay | Admitting: Family Medicine

## 2014-12-04 ENCOUNTER — Encounter: Payer: Self-pay | Admitting: Family Medicine

## 2014-12-04 ENCOUNTER — Ambulatory Visit (INDEPENDENT_AMBULATORY_CARE_PROVIDER_SITE_OTHER): Payer: Medicare Other | Admitting: Family Medicine

## 2014-12-04 VITALS — BP 148/78 | HR 87 | Resp 16 | Ht 65.0 in | Wt 210.0 lb

## 2014-12-04 DIAGNOSIS — G5602 Carpal tunnel syndrome, left upper limb: Secondary | ICD-10-CM | POA: Diagnosis not present

## 2014-12-04 DIAGNOSIS — M549 Dorsalgia, unspecified: Secondary | ICD-10-CM

## 2014-12-04 DIAGNOSIS — E039 Hypothyroidism, unspecified: Secondary | ICD-10-CM | POA: Diagnosis not present

## 2014-12-04 DIAGNOSIS — K140 Glossitis: Secondary | ICD-10-CM

## 2014-12-04 DIAGNOSIS — E1129 Type 2 diabetes mellitus with other diabetic kidney complication: Secondary | ICD-10-CM

## 2014-12-04 DIAGNOSIS — F418 Other specified anxiety disorders: Secondary | ICD-10-CM

## 2014-12-04 DIAGNOSIS — E669 Obesity, unspecified: Secondary | ICD-10-CM

## 2014-12-04 DIAGNOSIS — I2510 Atherosclerotic heart disease of native coronary artery without angina pectoris: Secondary | ICD-10-CM

## 2014-12-04 DIAGNOSIS — J029 Acute pharyngitis, unspecified: Secondary | ICD-10-CM

## 2014-12-04 DIAGNOSIS — E785 Hyperlipidemia, unspecified: Secondary | ICD-10-CM | POA: Diagnosis not present

## 2014-12-04 DIAGNOSIS — N3946 Mixed incontinence: Secondary | ICD-10-CM | POA: Diagnosis not present

## 2014-12-04 DIAGNOSIS — I10 Essential (primary) hypertension: Secondary | ICD-10-CM | POA: Diagnosis not present

## 2014-12-04 MED ORDER — METHYLPREDNISOLONE ACETATE 80 MG/ML IJ SUSP
80.0000 mg | Freq: Once | INTRAMUSCULAR | Status: AC
  Administered 2014-12-04: 80 mg via INTRAMUSCULAR

## 2014-12-04 MED ORDER — FLUCONAZOLE 150 MG PO TABS: ORAL_TABLET | ORAL | Status: DC

## 2014-12-04 MED ORDER — KETOROLAC TROMETHAMINE 60 MG/2ML IM SOLN
60.0000 mg | Freq: Once | INTRAMUSCULAR | Status: AC
  Administered 2014-12-04: 60 mg via INTRAMUSCULAR

## 2014-12-04 MED ORDER — LORATADINE 10 MG PO TABS: 10.0000 mg | ORAL_TABLET | Freq: Every day | ORAL | Status: DC | PRN

## 2014-12-04 MED ORDER — ROSUVASTATIN CALCIUM 40 MG PO TABS: 40.0000 mg | ORAL_TABLET | Freq: Every day | ORAL | Status: DC

## 2014-12-04 MED ORDER — FIRST-DUKES MOUTHWASH MT SUSP: 10.0000 mL | Freq: Three times a day (TID) | OROMUCOSAL | Status: DC

## 2014-12-04 MED ORDER — PREDNISONE (PAK) 5 MG PO TABS: ORAL_TABLET | ORAL | Status: DC

## 2014-12-04 NOTE — Patient Instructions (Signed)
Annual wellness in 4  month , call if you need me before  Toradol and depo medrol in office for low back pain and prednisone dose pack sent  In   Fluconazole tablets and Dukes mouthwash for 1 week for sore throat , call back if persists , i will refer you to ENT  You are referred to Dr Luna Glasgow for left hand pain due to carpal tunnel syndrome  Congrats on smoking cessation  Labs are much improved though not at goal. Will try  To change from simvastatin to crestor, STOP simvastatin once you get crestor  Fasting lipid, cmp and EGFr , hBA1C and TSH in 4 month  STOP naproxen since you have chronic kidney disease

## 2014-12-04 NOTE — Progress Notes (Signed)
Subjective:    Patient ID: Tammy Suarez, female    DOB: 1945/12/07, 69 y.o.   MRN: [id number]  HPI  The PT is here for follow up and re-evaluation of chronic medical conditions, medication management and review of any available recent lab and radiology data.  Preventive health is updated, specifically  Cancer screening and Immunization.   Questions or concerns regarding consultations or procedures which the PT has had in the interim are  addressed. The PT denies any adverse reactions to current medications since the last visit.  Increased low back pain 1 month after fall, still persists Increased bilateral hand pain and numbness awakening her from sleep, right more than left, also weakness noted in grip     Review of Systems See HPI Denies recent fever or chills. Denies sinus pressure, nasal congestion, ear pain or sore throat. Denies chest congestion, productive cough or wheezing. Denies chest pains, palpitations and leg swelling Denies abdominal pain, nausea, vomiting,diarrhea or constipation.   Denies dysuria, frequency, hesitancy or uncontrolled incontinence.  Denies headaches, seizures, numbness, or tingling. Denies uncontrolled depression, anxiety or insomnia. Denies skin break down or rash.        Objective:   Physical Exam BP 148/78 mmHg  Pulse 87  Resp 16  Ht 5\' 5"  (1.651 m)  Wt 210 lb (95.255 kg)  BMI 34.95 kg/m2  SpO2 97% Patient alert and oriented and in no cardiopulmonary distress.  HEENT: No facial asymmetry, EOMI,   oropharynx pink and moist.  Neck decreased ROM, no JVD, no mass.No thyromegaly  Chest: Clear to auscultation bilaterally.  CVS: S1, S2 no murmurs, no S3.Regular rate.  ABD: Soft non tender.   Ext: No edema  MS: Decreased  ROM lumbar  spine, adequate in shoulders, hips and knees.Abnormal gait  Skin: Intact, no ulcerations or rash noted.  Psych: Good eye contact, normal affect. Memory intact not anxious or depressed  appearing.  CNS: CN 2-12 intact, power,  normal throughout.no focal deficits noted.        Assessment & Plan:  Essential hypertension, benign Controlled, no change in medication DASH diet and commitment to daily physical activity for a minimum of 30 minutes discussed and encouraged, as a part of hypertension management. The importance of attaining a healthy weight is also discussed.    Coronary atherosclerosis of native coronary artery Currently stable Denies chest pain or excess fatigue   Glossitis dulkes mouthwash and fluconazole tabs, white possibly ulcerated  lesion on uvula, is pain persists will refer to ENt   Left carpal tunnel syndrome Symptomatic with positive tinnel's refer ortho   Hyperlipidemia with target LDL less than 70 Improved, though tG remain elevated Hyperlipidemia:Low fat diet discussed and encouraged.  Updated lab needed at/ before next visit. Will change to crestor if will be coverd, LDL goal is 70   DM (diabetes mellitus), type 2 with renal complications Controlled, no change in management    Back pain with radiation Increased and uncontrolled x 4 weeks following fall, toradol and depo medrol in office    Depression with anxiety Controlled, no change in medication    Urinary incontinence Controlled, no change in medication    Obesity (BMI 30.0-34.9) Deteriorated. Patient re-educated about  the importance of commitment to a  minimum of 150 minutes of exercise per week. The importance of healthy food choices with portion control discussed. Encouraged to start a food diary, count calories and to consider  joining a support group. Sample diet sheets offered. Goals set  by the patient for the next several months.

## 2014-12-08 NOTE — Assessment & Plan Note (Signed)
Controlled, no change in medication DASH diet and commitment to daily physical activity for a minimum of 30 minutes discussed and encouraged, as a part of hypertension management. The importance of attaining a healthy weight is also discussed.  

## 2014-12-08 NOTE — Assessment & Plan Note (Signed)
Controlled, no change in medication  

## 2014-12-08 NOTE — Assessment & Plan Note (Signed)
Symptomatic with positive tinnel's refer ortho

## 2014-12-08 NOTE — Assessment & Plan Note (Signed)
dulkes mouthwash and fluconazole tabs, white possibly ulcerated  lesion on uvula, is pain persists will refer to ENt

## 2014-12-08 NOTE — Assessment & Plan Note (Signed)
Currently stable Denies chest pain or excess fatigue

## 2014-12-08 NOTE — Assessment & Plan Note (Signed)
Improved, though tG remain elevated Hyperlipidemia:Low fat diet discussed and encouraged.  Updated lab needed at/ before next visit. Will change to crestor if will be coverd, LDL goal is 70

## 2014-12-08 NOTE — Assessment & Plan Note (Signed)
Increased and uncontrolled x 4 weeks following fall, toradol and depo medrol in office

## 2014-12-08 NOTE — Assessment & Plan Note (Signed)
Deteriorated. Patient re-educated about  the importance of commitment to a  minimum of 150 minutes of exercise per week. The importance of healthy food choices with portion control discussed. Encouraged to start a food diary, count calories and to consider  joining a support group. Sample diet sheets offered. Goals set by the patient for the next several months.    

## 2014-12-08 NOTE — Assessment & Plan Note (Signed)
Controlled , no change in management 

## 2014-12-13 ENCOUNTER — Other Ambulatory Visit: Payer: Self-pay

## 2014-12-13 MED ORDER — HYDROCODONE-ACETAMINOPHEN 10-325 MG PO TABS: 1.0000 | ORAL_TABLET | Freq: Four times a day (QID) | ORAL | Status: DC

## 2014-12-20 ENCOUNTER — Other Ambulatory Visit: Payer: Self-pay | Admitting: Family Medicine

## 2014-12-23 ENCOUNTER — Other Ambulatory Visit: Payer: Self-pay | Admitting: Family Medicine

## 2014-12-27 ENCOUNTER — Other Ambulatory Visit: Payer: Self-pay | Admitting: Family Medicine

## 2015-01-09 ENCOUNTER — Other Ambulatory Visit: Payer: Self-pay | Admitting: Family Medicine

## 2015-01-10 ENCOUNTER — Other Ambulatory Visit: Payer: Self-pay

## 2015-01-10 MED ORDER — HYDROCODONE-ACETAMINOPHEN 10-325 MG PO TABS: 1.0000 | ORAL_TABLET | Freq: Four times a day (QID) | ORAL | Status: DC

## 2015-01-12 ENCOUNTER — Other Ambulatory Visit: Payer: Self-pay | Admitting: Family Medicine

## 2015-01-21 DIAGNOSIS — W19XXXA Unspecified fall, initial encounter: Secondary | ICD-10-CM | POA: Diagnosis not present

## 2015-01-21 DIAGNOSIS — F17200 Nicotine dependence, unspecified, uncomplicated: Secondary | ICD-10-CM | POA: Diagnosis not present

## 2015-01-21 DIAGNOSIS — M545 Low back pain: Secondary | ICD-10-CM | POA: Diagnosis not present

## 2015-01-21 DIAGNOSIS — S300XXA Contusion of lower back and pelvis, initial encounter: Secondary | ICD-10-CM | POA: Diagnosis not present

## 2015-01-21 DIAGNOSIS — S20229A Contusion of unspecified back wall of thorax, initial encounter: Secondary | ICD-10-CM | POA: Diagnosis not present

## 2015-01-21 DIAGNOSIS — M47816 Spondylosis without myelopathy or radiculopathy, lumbar region: Secondary | ICD-10-CM | POA: Diagnosis not present

## 2015-01-26 ENCOUNTER — Other Ambulatory Visit: Payer: Self-pay | Admitting: Family Medicine

## 2015-01-30 ENCOUNTER — Ambulatory Visit (INDEPENDENT_AMBULATORY_CARE_PROVIDER_SITE_OTHER): Payer: Medicare Other | Admitting: Family Medicine

## 2015-01-30 ENCOUNTER — Encounter: Payer: Self-pay | Admitting: Family Medicine

## 2015-01-30 VITALS — BP 138/72 | HR 78 | Resp 18 | Ht 65.0 in | Wt 206.0 lb

## 2015-01-30 DIAGNOSIS — M549 Dorsalgia, unspecified: Secondary | ICD-10-CM

## 2015-01-30 DIAGNOSIS — F17210 Nicotine dependence, cigarettes, uncomplicated: Secondary | ICD-10-CM

## 2015-01-30 DIAGNOSIS — R208 Other disturbances of skin sensation: Secondary | ICD-10-CM

## 2015-01-30 DIAGNOSIS — I10 Essential (primary) hypertension: Secondary | ICD-10-CM

## 2015-01-30 DIAGNOSIS — Z6830 Body mass index (BMI) 30.0-30.9, adult: Secondary | ICD-10-CM

## 2015-01-30 DIAGNOSIS — R200 Anesthesia of skin: Secondary | ICD-10-CM

## 2015-01-30 DIAGNOSIS — E785 Hyperlipidemia, unspecified: Secondary | ICD-10-CM

## 2015-01-30 DIAGNOSIS — E1129 Type 2 diabetes mellitus with other diabetic kidney complication: Secondary | ICD-10-CM

## 2015-01-30 DIAGNOSIS — E669 Obesity, unspecified: Secondary | ICD-10-CM

## 2015-01-30 DIAGNOSIS — R29898 Other symptoms and signs involving the musculoskeletal system: Secondary | ICD-10-CM

## 2015-01-30 DIAGNOSIS — Z23 Encounter for immunization: Secondary | ICD-10-CM | POA: Diagnosis not present

## 2015-01-30 DIAGNOSIS — R296 Repeated falls: Secondary | ICD-10-CM

## 2015-01-30 DIAGNOSIS — F418 Other specified anxiety disorders: Secondary | ICD-10-CM

## 2015-01-30 DIAGNOSIS — E038 Other specified hypothyroidism: Secondary | ICD-10-CM

## 2015-01-30 DIAGNOSIS — I2510 Atherosclerotic heart disease of native coronary artery without angina pectoris: Secondary | ICD-10-CM | POA: Diagnosis not present

## 2015-01-30 DIAGNOSIS — F17218 Nicotine dependence, cigarettes, with other nicotine-induced disorders: Secondary | ICD-10-CM

## 2015-01-30 MED ORDER — GABAPENTIN 300 MG PO CAPS: 300.0000 mg | ORAL_CAPSULE | Freq: Three times a day (TID) | ORAL | Status: DC

## 2015-01-30 NOTE — Progress Notes (Signed)
Subjective:    Patient ID: Tammy Suarez, female    DOB: Jun 07, 1946, 69 y.o.   MRN: [id number]  HPI C/o i uncontrolled back pain radaiting to right foot not improving since her fall in December, actually causing increasing difficulty with mobility , disturbs her sleep at night , and she is experiencing weakness and numbness in the right leg also, states she cannot continue with this much pain and needs help Denies polyuria, polydipsia, blurred vision , or hypoglycemic episodes. Management is by diet only   Review of Systems See HPI Denies recent fever or chills. Denies sinus pressure, nasal congestion, ear pain or sore throat. Denies chest congestion, productive cough or wheezing. Denies chest pains, palpitations and leg swelling Denies abdominal pain, nausea, vomiting,diarrhea or constipation.   Denies dysuria, frequency, hesitancy or uncontrolled  Incontinence. Medication is working well for her  Denies headaches, seizures, numbness, or tingling. Denies  Uncontrolled depression,  However anxiety and  Insomnia.have worsened due to her pain Denies skin break down or rash.         Objective:   Physical Exam BP 138/72 mmHg  Pulse 78  Resp 18  Ht 5\' 5"  (1.651 m)  Wt 206 lb 0.6 oz (93.459 kg)  BMI 34.29 kg/m2  SpO2 96% Patient alert and oriented and in no cardiopulmonary distress.  HEENT: No facial asymmetry, EOMI,   oropharynx pink and moist.  Neck supple no JVD, no mass.  Chest: Clear to auscultation bilaterally.Decreased air wntry  CVS: S1, S2 no murmurs, no S3.Regular rate.  ABD: Soft non tender.   Ext: No edema  MS: decreased ROM lumbar spine, adequate in shoulders, hips and knees  Skin: Intact, no ulcerations or rash noted.  Psych: Good eye contact, normal affect. Memory intact not anxious or depressed appearing.  CNS: CN 2-12 intact, grade 4 power in right lower extremity and reduced sensation in right thigh        Assessment & Plan:  Back pain with  radiation ongoing uncontrolled and debilitating low back pain radiaiting to right foot with weakness and numbness following fall approx 2.5 months ago No response to medication, interfering adversely with sleep and quality of life Has had lumbar surgery in past Exam is positive for right lower extremity weakness and reduced sensation Denies any change in  incontinence of  Urine, has no stool incontinence Ideal re imaging study would include contrast , however, due to CKD , I have discussed alos with radiologist will need to re image without   Nicotine dependence unchanged Patient counseled for approximately 5 minutes regarding the health risks of ongoing nicotine use, specifically all types of cancer, heart disease, stroke and respiratory failure. The options available for help with cessation ,the behavioral changes to assist the process, and the option to either gradully reduce usage  Or abruptly stop.is also discussed. Pt is also encouraged to set specific goals in number of cigarettes used daily, as well as to set a quit date.  Number of cigarettes/cigars currently smoking daily: 10    Need for vaccination with 13-polyvalent pneumococcal conjugate vaccine After obtaining informed consent, the vaccine is  administered by LPN.    Essential hypertension, benign Controlled, no change in medication DASH diet and commitment to daily physical activity for a minimum of 30 minutes discussed and encouraged, as a part of hypertension management. The importance of attaining a healthy weight is also discussed.  BP/Weight 01/30/2015 12/04/2014 10/05/2014 09/03/2014 06/05/2014 123456 123456  Systolic BP 0000000 123456 0000000  130 [id number] 123456 0000000  Diastolic BP 72 78 70 70 71 80 79  Wt. (Lbs) 206.04 210 211 212.12 - 211.4 213  BMI 34.29 34.95 35.11 35.3 - 35.18 35.44    CMP Latest Ref Rng 01/30/2015 11/19/2014 09/17/2014  Glucose 70 - 99 mg/dL 116(H) 123(H) 190(H)  BUN 6 - 23 mg/dL 51(H) 28(H) 35(H)    Creatinine 0.50 - 1.10 mg/dL 1.68(H) 1.57(H) 1.80(H)  Sodium 135 - 145 mEq/L 136 136 133(L)  Potassium 3.5 - 5.3 mEq/L 4.6 4.9 5.1  Chloride 96 - 112 mEq/L 97 101 95(L)  CO2 19 - 32 mEq/L 30 27 25   Calcium 8.4 - 10.5 mg/dL 9.9 9.4 9.6  Total Protein 6.0 - 8.3 g/dL 6.5 6.4 -  Total Bilirubin 0.2 - 1.2 mg/dL 0.4 0.4 -  Alkaline Phos 39 - 117 U/L 60 42 -  AST 0 - 37 U/L 12 15 -  ALT 0 - 35 U/L 10 9 -        DM (diabetes mellitus), type 2 with renal complications Patient educated about the importance of limiting  Carbohydrate intake , the need to commit to daily physical activity for a minimum of 30 minutes , and to commit weight loss. The fact that changes in all these areas will reduce or eliminate all together the development of diabetes is stressed.   Diabetic Labs Latest Ref Rng 01/30/2015 11/19/2014 09/17/2014 08/17/2014 04/11/2014  HbA1c <5.7 % - 6.8(H) - 6.9(H) 6.5(H)  Microalbumin <2.0 mg/dL - - 4.1(H) - -  Micro/Creat Ratio 0.0 - 30.0 mg/g - - 28.6 - -  Chol 0 - 200 mg/dL - 149 - 173 -  HDL >39 mg/dL - 39(L) - 37(L) -  Calc LDL 0 - 99 mg/dL - 80 - 92 -  Triglycerides <150 mg/dL - 150(H) - 220(H) -  Creatinine 0.50 - 1.10 mg/dL 1.68(H) 1.57(H) 1.80(H) 2.13(H) 1.87(H)   BP/Weight 01/30/2015 12/04/2014 10/05/2014 09/03/2014 06/05/2014 123456 123456  Systolic BP 0000000 123456 0000000 [id number] 123456 0000000  Diastolic BP 72 78 70 70 71 80 79  Wt. (Lbs) 206.04 210 211 212.12 - 211.4 213  BMI 34.29 34.95 35.11 35.3 - 35.18 35.44   Foot/eye exam completion dates Latest Ref Rng 09/03/2014 07/18/2014  Eye Exam No Retinopathy - No Retinopathy  Foot exam Order - - -  Foot Form Completion - Done -         Obesity (BMI 30.0-34.9) Deteriorated. Patient re-educated about  the importance of commitment to a  minimum of 150 minutes of exercise per week.  The importance of healthy food choices with portion control discussed. Encouraged to start a food diary, count calories and to consider  joining a  support group. Sample diet sheets offered. Goals set by the patient for the next several months.   Wt Readings from Last 3 Encounters:  01/30/15 206 lb 0.6 oz (93.459 kg)  12/04/14 210 lb (95.255 kg)  10/05/14 211 lb (95.709 kg)    Body mass index is 34.29 kg/(m^2).  Current exercise per week 30 minutes.    Recurrent falls Right lower extremity weakness and numbness and worse since fall approx 2.5 months ago   Hyperlipidemia with target LDL less than 70 Controlled, no change in medication Hyperlipidemia:Low fat diet discussed and encouraged.  CMP Latest Ref Rng 01/30/2015 11/19/2014 09/17/2014  Glucose 70 - 99 mg/dL 116(H) 123(H) 190(H)  BUN 6 - 23 mg/dL 51(H) 28(H) 35(H)  Creatinine 0.50 - 1.10 mg/dL 1.68(H) 1.57(H) 1.80(H)  Sodium  135 - 145 mEq/L 136 136 133(L)  Potassium 3.5 - 5.3 mEq/L 4.6 4.9 5.1  Chloride 96 - 112 mEq/L 97 101 95(L)  CO2 19 - 32 mEq/L 30 27 25   Calcium 8.4 - 10.5 mg/dL 9.9 9.4 9.6  Total Protein 6.0 - 8.3 g/dL 6.5 6.4 -  Total Bilirubin 0.2 - 1.2 mg/dL 0.4 0.4 -  Alkaline Phos 39 - 117 U/L 60 42 -  AST 0 - 37 U/L 12 15 -  ALT 0 - 35 U/L 10 9 -    Lipid Panel     Component Value Date/Time   CHOL 149 11/19/2014 1125   TRIG 150* 11/19/2014 1125   HDL 39* 11/19/2014 1125   CHOLHDL 3.8 11/19/2014 1125   VLDL 30 11/19/2014 1125   LDLCALC 80 11/19/2014 1125         Hypothyroidism Controlled, no change in medication    Depression with anxiety Controlled, no change in medication

## 2015-01-30 NOTE — Patient Instructions (Addendum)
Keep May appt   New for sciatic pain down right leg is gabapentin  Do NOT TAKE AS ON THE BOTTLE  To START OFF  New for management of pain due to nerve irritation is gabapentin $RemoveBefor'300mg'[id number]$ .  Start one capsule at bedtime, after 3 to 5 days increase , if needed, to two capsules at bedtime or one twice daily. and after an additional 5 days to 3 capsules daily, one every 8 hours, or one in th morning and two at night , for pain control.  Please note, the script is written as one three times daily, DO NOT take like this, instead take only as above.  Chem 7 an EGFR today  MRI is will be  Ordered after lab is bck and iduiscuss safest study with the radiologist , since kidneys don't work 100%  Prevnar today   Please work on smoking cessation, you need to quit

## 2015-01-31 LAB — COMPLETE METABOLIC PANEL WITH GFR
ALK PHOS: 60 U/L (ref 39–117)
ALT: 10 U/L (ref 0–35)
AST: 12 U/L (ref 0–37)
Albumin: 4 g/dL (ref 3.5–5.2)
BUN: 51 mg/dL — ABNORMAL HIGH (ref 6–23)
CALCIUM: 9.9 mg/dL (ref 8.4–10.5)
CHLORIDE: 97 meq/L (ref 96–112)
CO2: 30 mEq/L (ref 19–32)
Creat: 1.68 mg/dL — ABNORMAL HIGH (ref 0.50–1.10)
GFR, EST NON AFRICAN AMERICAN: 31 mL/min — AB
GFR, Est African American: 35 mL/min — ABNORMAL LOW
GLUCOSE: 116 mg/dL — AB (ref 70–99)
Potassium: 4.6 mEq/L (ref 3.5–5.3)
Sodium: 136 mEq/L (ref 135–145)
Total Bilirubin: 0.4 mg/dL (ref 0.2–1.2)
Total Protein: 6.5 g/dL (ref 6.0–8.3)

## 2015-02-06 ENCOUNTER — Other Ambulatory Visit: Payer: Self-pay

## 2015-02-06 MED ORDER — HYDROCODONE-ACETAMINOPHEN 10-325 MG PO TABS: 1.0000 | ORAL_TABLET | Freq: Four times a day (QID) | ORAL | Status: DC

## 2015-02-08 ENCOUNTER — Other Ambulatory Visit: Payer: Self-pay | Admitting: Family Medicine

## 2015-02-11 ENCOUNTER — Encounter: Payer: Self-pay | Admitting: Family Medicine

## 2015-02-11 DIAGNOSIS — Z23 Encounter for immunization: Secondary | ICD-10-CM | POA: Insufficient documentation

## 2015-02-11 NOTE — Assessment & Plan Note (Signed)
Controlled, no change in medication DASH diet and commitment to daily physical activity for a minimum of 30 minutes discussed and encouraged, as a part of hypertension management. The importance of attaining a healthy weight is also discussed.  BP/Weight 01/30/2015 12/04/2014 10/05/2014 09/03/2014 06/05/2014 123456 123456  Systolic BP 0000000 123456 0000000 [id number] 123456 0000000  Diastolic BP 72 78 70 70 71 80 79  Wt. (Lbs) 206.04 210 211 212.12 - 211.4 213  BMI 34.29 34.95 35.11 35.3 - 35.18 35.44    CMP Latest Ref Rng 01/30/2015 11/19/2014 09/17/2014  Glucose 70 - 99 mg/dL 116(H) 123(H) 190(H)  BUN 6 - 23 mg/dL 51(H) 28(H) 35(H)  Creatinine 0.50 - 1.10 mg/dL 1.68(H) 1.57(H) 1.80(H)  Sodium 135 - 145 mEq/L 136 136 133(L)  Potassium 3.5 - 5.3 mEq/L 4.6 4.9 5.1  Chloride 96 - 112 mEq/L 97 101 95(L)  CO2 19 - 32 mEq/L 30 27 25   Calcium 8.4 - 10.5 mg/dL 9.9 9.4 9.6  Total Protein 6.0 - 8.3 g/dL 6.5 6.4 -  Total Bilirubin 0.2 - 1.2 mg/dL 0.4 0.4 -  Alkaline Phos 39 - 117 U/L 60 42 -  AST 0 - 37 U/L 12 15 -  ALT 0 - 35 U/L 10 9 -

## 2015-02-11 NOTE — Assessment & Plan Note (Signed)
Deteriorated. Patient re-educated about  the importance of commitment to a  minimum of 150 minutes of exercise per week.  The importance of healthy food choices with portion control discussed. Encouraged to start a food diary, count calories and to consider  joining a support group. Sample diet sheets offered. Goals set by the patient for the next several months.   Wt Readings from Last 3 Encounters:  01/30/15 206 lb 0.6 oz (93.459 kg)  12/04/14 210 lb (95.255 kg)  10/05/14 211 lb (95.709 kg)    Body mass index is 34.29 kg/(m^2).  Current exercise per week 30 minutes.

## 2015-02-11 NOTE — Assessment & Plan Note (Addendum)
unchanged Patient counseled for approximately 5 minutes regarding the health risks of ongoing nicotine use, specifically all types of cancer, heart disease, stroke and respiratory failure. The options available for help with cessation ,the behavioral changes to assist the process, and the option to either gradully reduce usage  Or abruptly stop.is also discussed. Pt is also encouraged to set specific goals in number of cigarettes used daily, as well as to set a quit date.  Number of cigarettes/cigars currently smoking daily: 10

## 2015-02-11 NOTE — Assessment & Plan Note (Signed)
After obtaining informed consent, the vaccine is  administered by LPN.  

## 2015-02-11 NOTE — Assessment & Plan Note (Addendum)
ongoing uncontrolled and debilitating low back pain radiaiting to right foot with weakness and numbness following fall approx 2.5 months ago No response to medication, interfering adversely with sleep and quality of life Has had lumbar surgery in past Exam is positive for right lower extremity weakness and reduced sensation Denies any change in  incontinence of  Urine, has no stool incontinence Ideal re imaging study would include contrast , however, due to CKD , I have discussed alos with radiologist will need to re image without

## 2015-02-11 NOTE — Assessment & Plan Note (Signed)
Controlled, no change in medication  

## 2015-02-11 NOTE — Assessment & Plan Note (Signed)
Patient educated about the importance of limiting  Carbohydrate intake , the need to commit to daily physical activity for a minimum of 30 minutes , and to commit weight loss. The fact that changes in all these areas will reduce or eliminate all together the development of diabetes is stressed.   Diabetic Labs Latest Ref Rng 01/30/2015 11/19/2014 09/17/2014 08/17/2014 04/11/2014  HbA1c <5.7 % - 6.8(H) - 6.9(H) 6.5(H)  Microalbumin <2.0 mg/dL - - 4.1(H) - -  Micro/Creat Ratio 0.0 - 30.0 mg/g - - 28.6 - -  Chol 0 - 200 mg/dL - 149 - 173 -  HDL >39 mg/dL - 39(L) - 37(L) -  Calc LDL 0 - 99 mg/dL - 80 - 92 -  Triglycerides <150 mg/dL - 150(H) - 220(H) -  Creatinine 0.50 - 1.10 mg/dL 1.68(H) 1.57(H) 1.80(H) 2.13(H) 1.87(H)   BP/Weight 01/30/2015 12/04/2014 10/05/2014 09/03/2014 06/05/2014 123456 123456  Systolic BP 0000000 123456 0000000 [id number] 123456 0000000  Diastolic BP 72 78 70 70 71 80 79  Wt. (Lbs) 206.04 210 211 212.12 - 211.4 213  BMI 34.29 34.95 35.11 35.3 - 35.18 35.44   Foot/eye exam completion dates Latest Ref Rng 09/03/2014 07/18/2014  Eye Exam No Retinopathy - No Retinopathy  Foot exam Order - - -  Foot Form Completion - Done -

## 2015-02-11 NOTE — Assessment & Plan Note (Signed)
Controlled, no change in medication Hyperlipidemia:Low fat diet discussed and encouraged.  CMP Latest Ref Rng 01/30/2015 11/19/2014 09/17/2014  Glucose 70 - 99 mg/dL 116(H) 123(H) 190(H)  BUN 6 - 23 mg/dL 51(H) 28(H) 35(H)  Creatinine 0.50 - 1.10 mg/dL 1.68(H) 1.57(H) 1.80(H)  Sodium 135 - 145 mEq/L 136 136 133(L)  Potassium 3.5 - 5.3 mEq/L 4.6 4.9 5.1  Chloride 96 - 112 mEq/L 97 101 95(L)  CO2 19 - 32 mEq/L 30 27 25   Calcium 8.4 - 10.5 mg/dL 9.9 9.4 9.6  Total Protein 6.0 - 8.3 g/dL 6.5 6.4 -  Total Bilirubin 0.2 - 1.2 mg/dL 0.4 0.4 -  Alkaline Phos 39 - 117 U/L 60 42 -  AST 0 - 37 U/L 12 15 -  ALT 0 - 35 U/L 10 9 -    Lipid Panel     Component Value Date/Time   CHOL 149 11/19/2014 1125   TRIG 150* 11/19/2014 1125   HDL 39* 11/19/2014 1125   CHOLHDL 3.8 11/19/2014 1125   VLDL 30 11/19/2014 1125   LDLCALC 80 11/19/2014 1125

## 2015-02-11 NOTE — Assessment & Plan Note (Signed)
Right lower extremity weakness and numbness and worse since fall approx 2.5 months ago

## 2015-02-12 ENCOUNTER — Telehealth: Payer: Self-pay | Admitting: Family Medicine

## 2015-02-12 MED ORDER — LORAZEPAM 1 MG PO TABS: ORAL_TABLET | ORAL | Status: DC

## 2015-02-12 NOTE — Telephone Encounter (Signed)
Ativan printed

## 2015-02-12 NOTE — Telephone Encounter (Signed)
Called and left message notifying patient that med has been sent

## 2015-02-12 NOTE — Telephone Encounter (Signed)
Please advise 

## 2015-02-14 ENCOUNTER — Other Ambulatory Visit: Payer: Self-pay | Admitting: Family Medicine

## 2015-02-15 ENCOUNTER — Other Ambulatory Visit: Payer: Self-pay

## 2015-02-18 ENCOUNTER — Other Ambulatory Visit: Payer: Self-pay | Admitting: Family Medicine

## 2015-02-18 ENCOUNTER — Ambulatory Visit (HOSPITAL_COMMUNITY): Payer: Medicare Other

## 2015-02-18 ENCOUNTER — Ambulatory Visit (HOSPITAL_COMMUNITY)
Admission: RE | Admit: 2015-02-18 | Discharge: 2015-02-18 | Disposition: A | Discharge status: Home / Self Care | Payer: Medicare Other | Source: Ambulatory Visit | Attending: Family Medicine | Admitting: Family Medicine

## 2015-02-18 DIAGNOSIS — M6281 Muscle weakness (generalized): Secondary | ICD-10-CM | POA: Diagnosis not present

## 2015-02-18 DIAGNOSIS — M5416 Radiculopathy, lumbar region: Secondary | ICD-10-CM | POA: Diagnosis not present

## 2015-02-18 DIAGNOSIS — R29898 Other symptoms and signs involving the musculoskeletal system: Secondary | ICD-10-CM

## 2015-02-18 DIAGNOSIS — M4806 Spinal stenosis, lumbar region: Secondary | ICD-10-CM | POA: Diagnosis not present

## 2015-02-18 DIAGNOSIS — M5440 Lumbago with sciatica, unspecified side: Secondary | ICD-10-CM

## 2015-02-18 DIAGNOSIS — M549 Dorsalgia, unspecified: Secondary | ICD-10-CM

## 2015-02-18 DIAGNOSIS — R200 Anesthesia of skin: Secondary | ICD-10-CM

## 2015-02-19 NOTE — Progress Notes (Signed)
Patient is aware 

## 2015-02-22 DIAGNOSIS — R030 Elevated blood-pressure reading, without diagnosis of hypertension: Secondary | ICD-10-CM | POA: Diagnosis not present

## 2015-02-22 DIAGNOSIS — M4316 Spondylolisthesis, lumbar region: Secondary | ICD-10-CM | POA: Diagnosis not present

## 2015-03-14 ENCOUNTER — Encounter (HOSPITAL_COMMUNITY): Payer: Self-pay | Admitting: Emergency Medicine

## 2015-03-14 ENCOUNTER — Emergency Department (HOSPITAL_COMMUNITY): Payer: Medicare Other

## 2015-03-14 ENCOUNTER — Emergency Department (HOSPITAL_COMMUNITY)
Admission: EM | Admit: 2015-03-14 | Discharge: 2015-03-14 | Disposition: A | Discharge status: Home / Self Care | Payer: Medicare Other | Attending: Emergency Medicine | Admitting: Emergency Medicine

## 2015-03-14 DIAGNOSIS — W1839XA Other fall on same level, initial encounter: Secondary | ICD-10-CM | POA: Insufficient documentation

## 2015-03-14 DIAGNOSIS — E119 Type 2 diabetes mellitus without complications: Secondary | ICD-10-CM | POA: Insufficient documentation

## 2015-03-14 DIAGNOSIS — S0083XA Contusion of other part of head, initial encounter: Secondary | ICD-10-CM

## 2015-03-14 DIAGNOSIS — N183 Chronic kidney disease, stage 3 (moderate): Secondary | ICD-10-CM | POA: Insufficient documentation

## 2015-03-14 DIAGNOSIS — Y998 Other external cause status: Secondary | ICD-10-CM | POA: Insufficient documentation

## 2015-03-14 DIAGNOSIS — Z951 Presence of aortocoronary bypass graft: Secondary | ICD-10-CM | POA: Insufficient documentation

## 2015-03-14 DIAGNOSIS — W19XXXA Unspecified fall, initial encounter: Secondary | ICD-10-CM

## 2015-03-14 DIAGNOSIS — Y9389 Activity, other specified: Secondary | ICD-10-CM | POA: Diagnosis not present

## 2015-03-14 DIAGNOSIS — I129 Hypertensive chronic kidney disease with stage 1 through stage 4 chronic kidney disease, or unspecified chronic kidney disease: Secondary | ICD-10-CM | POA: Diagnosis not present

## 2015-03-14 DIAGNOSIS — E669 Obesity, unspecified: Secondary | ICD-10-CM | POA: Diagnosis not present

## 2015-03-14 DIAGNOSIS — Z7982 Long term (current) use of aspirin: Secondary | ICD-10-CM | POA: Insufficient documentation

## 2015-03-14 DIAGNOSIS — G8929 Other chronic pain: Secondary | ICD-10-CM | POA: Diagnosis not present

## 2015-03-14 DIAGNOSIS — S0993XA Unspecified injury of face, initial encounter: Secondary | ICD-10-CM | POA: Diagnosis present

## 2015-03-14 DIAGNOSIS — S0990XA Unspecified injury of head, initial encounter: Secondary | ICD-10-CM | POA: Diagnosis not present

## 2015-03-14 DIAGNOSIS — Z9889 Other specified postprocedural states: Secondary | ICD-10-CM | POA: Insufficient documentation

## 2015-03-14 DIAGNOSIS — S0003XA Contusion of scalp, initial encounter: Secondary | ICD-10-CM | POA: Diagnosis not present

## 2015-03-14 DIAGNOSIS — Z720 Tobacco use: Secondary | ICD-10-CM | POA: Diagnosis not present

## 2015-03-14 DIAGNOSIS — Y9289 Other specified places as the place of occurrence of the external cause: Secondary | ICD-10-CM | POA: Insufficient documentation

## 2015-03-14 DIAGNOSIS — R42 Dizziness and giddiness: Secondary | ICD-10-CM | POA: Diagnosis not present

## 2015-03-14 DIAGNOSIS — K219 Gastro-esophageal reflux disease without esophagitis: Secondary | ICD-10-CM | POA: Diagnosis not present

## 2015-03-14 DIAGNOSIS — I2510 Atherosclerotic heart disease of native coronary artery without angina pectoris: Secondary | ICD-10-CM | POA: Diagnosis not present

## 2015-03-14 DIAGNOSIS — Z79899 Other long term (current) drug therapy: Secondary | ICD-10-CM | POA: Diagnosis not present

## 2015-03-14 DIAGNOSIS — E785 Hyperlipidemia, unspecified: Secondary | ICD-10-CM | POA: Diagnosis not present

## 2015-03-14 MED ORDER — ACETAMINOPHEN 500 MG PO TABS
1000.0000 mg | ORAL_TABLET | Freq: Once | ORAL | Status: AC
  Administered 2015-03-14: 1000 mg via ORAL
  Filled 2015-03-14: qty 2

## 2015-03-14 NOTE — Discharge Instructions (Signed)
X-rays were normal. You will be sore for several days. Expect to be black and blue for several weeks. Ice. Tylenol for pain.

## 2015-03-14 NOTE — ED Notes (Signed)
Pt reports falling yesterday evening. Pt denies LOC. Bruising and edema noted to face, abrasion to L side of face and through scalp. Pt reports some dizziness and nausea. Pt denies use of blood thinners.

## 2015-03-14 NOTE — ED Provider Notes (Signed)
CSN: [id number]     Arrival date & time 03/14/15  1135 History  This chart was scribed for Tammy Christen, MD by Randa Evens, ED Scribe. This patient was seen in room APA12/APA12 and the patient's care was started at 12:21 PM.     Chief Complaint  Patient presents with  . Fall   The history is provided by the patient. No language interpreter was used.   HPI Comments: Tammy Suarez is a 69 y.o. female who presents to the Emergency Department complaining of new fall 1 day ago at 6:30 PM. Pt presents with associated bruising, swelling and abrasion to the left side of her face. Pt reports some intermittent dizziness or nausea. Pt states that she was going to feed her goldfish and tripped falling forward hitting her head. Pt states she is amble to ambulate on her own. Pt denies confusion or LOC.    Past Medical History  Diagnosis Date  . Vitamin B 12 deficiency   . Chronic back pain   . GERD (gastroesophageal reflux disease)   . Hyperlipidemia   . Obesity   . Essential hypertension, benign   . Type 2 diabetes mellitus without complications   . History of conjunctivitis   . Coronary atherosclerosis of native coronary artery     Multivessel status post CABG - LIMA to LAD, SVG to OM and SVG to PDA  . CKD (chronic kidney disease) stage 3, GFR 30-59 ml/min    Past Surgical History  Procedure Laterality Date  . Cervical laminectomy    . Cholecystectomy    . Inguinal herniorrhaphy    . Vesicovaginal fistula closure w/ tah    . Lumbar fusion    . Left index finger repair    . Laparoscopic gastric banding  2009  . Cosmetic surgery  07/01/2012    Recurrent skin infection on lower abdomen, [facility]  . Abdominal hysterectomy    . Spine surgery    . Coronary artery bypass graft N/A 11/02/2013    Procedure: CORONARY ARTERY BYPASS GRAFTING (CABG);  Surgeon: Gaye Pollack, MD;  Location: [facility];  Service: Open Heart Surgery;  Laterality: N/A;  CABG x three, using left internal mammary artery and  right leg greater saphenous vein harvested endoscopically  . Intraoperative transesophageal echocardiogram N/A 11/02/2013    Procedure: INTRAOPERATIVE TRANSESOPHAGEAL ECHOCARDIOGRAM;  Surgeon: Gaye Pollack, MD;  Location: [facility] OR;  Service: Open Heart Surgery;  Laterality: N/A;  . Hernia repair    . Back surgery    . Cardiac surgery    . Left heart catheterization with coronary angiogram N/A 10/30/2013    Procedure: LEFT HEART CATHETERIZATION WITH CORONARY ANGIOGRAM;  Surgeon: Minus Breeding, MD;  Location: [facility] CATH LAB;  Service: Cardiovascular;  Laterality: N/A;   Family History  Problem Relation Age of Onset  . Heart attack Mother   . Heart attack Father   . Hypertension Sister   . Heart attack Sister   . Cancer Brother     Renal cell    History  Substance Use Topics  . Smoking status: Current Some Day Smoker -- 0.50 packs/day    Types: Cigarettes  . Smokeless tobacco: Former Systems developer    Quit date: 11/14/2014  . Alcohol Use: No   OB History    Gravida Para Term Preterm AB TAB SAB Ectopic Multiple Living   3 3 3       1      Review of Systems  Gastrointestinal: Positive for nausea.  Skin:  Positive for wound.  Neurological: Positive for dizziness. Negative for syncope.  Psychiatric/Behavioral: Negative for confusion.  All other systems reviewed and are negative.     Allergies  Meperidine hcl; Niacin; and Propoxyphene n-acetaminophen  Home Medications   Prior to Admission medications   Medication Sig Start Date End Date Taking? Authorizing Provider  amitriptyline (ELAVIL) 25 MG tablet TAKE 1 TABLET BY MOUTH AT BEDTIME 12/28/14  Yes Fayrene Helper, MD  aspirin EC 81 MG tablet Take 81 mg by mouth daily.   Yes Historical Provider, MD  Aspirin-Acetaminophen-Caffeine (GOODY HEADACHE PO) Take 1 Package by mouth 2 (two) times daily as needed (headache).   Yes Historical Provider, MD  buPROPion (WELLBUTRIN XL) 300 MG 24 hr tablet TAKE 1 TABLET BY MOUTH EVERY MORNING 12/28/14   Yes Fayrene Helper, MD  Choline Fenofibrate 45 MG capsule Take 1 capsule (45 mg total) by mouth daily. 08/02/14  Yes Fayrene Helper, MD  clonazePAM (KLONOPIN) 1 MG tablet TAKE 1 TABLET BY MOUTH 3 TIMES A DAY. DUE 11/26/2014 01/28/15  Yes Fayrene Helper, MD  DULoxetine (CYMBALTA) 60 MG capsule TAKE ONE CAPSULE BY MOUTH TWICE A DAY 01/10/15  Yes Fayrene Helper, MD  HYDROcodone-acetaminophen [facility]) 10-325 MG per tablet Take 1 tablet by mouth 4 (four) times daily. 02/06/15  Yes Fayrene Helper, MD  levothyroxine (SYNTHROID, LEVOTHROID) 75 MCG tablet TAKE 1 TABLET BY MOUTH EVERY DAY *DOSE CHANGE* 02/11/15  Yes Fayrene Helper, MD  loratadine (CLARITIN) 10 MG tablet Take 1 tablet (10 mg total) by mouth daily as needed for allergies. 12/04/14  Yes Fayrene Helper, MD  metoprolol succinate (TOPROL-XL) 25 MG 24 hr tablet TAKE 1 TABLET EVERY DAY 01/14/15  Yes Fayrene Helper, MD  Multiple Vitamin (MULTIVITAMIN) tablet Take 1 tablet by mouth daily.   Yes Historical Provider, MD  nitroGLYCERIN (NITROSTAT) 0.4 MG SL tablet Place 1 tablet (0.4 mg total) under the tongue every 5 (five) minutes as needed for chest pain. 10/05/14  Yes Satira Sark, MD  Omega-3 Fatty Acids (FISH OIL PO) Take 1 capsule by mouth daily.   Yes Historical Provider, MD  ranitidine (ZANTAC) 150 MG tablet TAKE 1 TABLET BY MOUTH TWICE A DAY *9/22* 02/18/15  Yes Fayrene Helper, MD  simvastatin (ZOCOR) 40 MG tablet TAKE 1 TABLET BY MOUTH AT BEDTIME *DISCONTINUE SIMVASTATIN 20MG * *9/20* 02/18/15  Yes Fayrene Helper, MD  VESICARE 10 MG tablet TAKE 1 TABLET BY MOUTH EVERY DAY 02/11/15  Yes Fayrene Helper, MD  gabapentin (NEURONTIN) 300 MG capsule Take 1 capsule (300 mg total) by mouth 3 (three) times daily. Patient not taking: Reported on 03/14/2015 01/30/15   Fayrene Helper, MD  LORazepam (ATIVAN) 1 MG tablet One tablet 30 mins before test, may rept once after 15 mins if needed Patient not taking: Reported on  03/14/2015 02/12/15   Fayrene Helper, MD  rosuvastatin (CRESTOR) 40 MG tablet Take 1 tablet (40 mg total) by mouth daily. Patient not taking: Reported on 03/14/2015 12/04/14   Fayrene Helper, MD   BP 145/94 mmHg  Pulse 65  Temp(Src) 98.4 F (36.9 C) (Oral)  Resp 18  Ht 5\' 5"  (1.651 m)  Wt 206 lb (93.441 kg)  BMI 34.28 kg/m2  SpO2 100%   Physical Exam  Constitutional: She is oriented to person, place, and time. She appears well-developed and well-nourished.  HENT:  ecchymosis around bilateral eyes, ecchymosis on forehead left greater than right, hematoma on left  forehead, small right parietal hematoma.    Eyes: Conjunctivae and EOM are normal. Pupils are equal, round, and reactive to light.  Neck: Normal range of motion. Neck supple.  Cardiovascular: Normal rate and regular rhythm.   Pulmonary/Chest: Effort normal and breath sounds normal.  Abdominal: Soft. Bowel sounds are normal.  Musculoskeletal: Normal range of motion.  Neurological: She is alert and oriented to person, place, and time.  Skin: Skin is warm and dry.  Psychiatric: She has a normal mood and affect. Her behavior is normal.  Nursing note and vitals reviewed.   ED Course  Procedures (including critical care time) DIAGNOSTIC STUDIES: Oxygen Saturation is 98% on RA, normal by my interpretation.    COORDINATION OF CARE: 1:02 PM-Discussed treatment plan with pt at bedside and pt agreed to plan.     Labs Review Labs Reviewed - No data to display  Imaging Review Ct Head Wo Contrast  03/14/2015   CLINICAL DATA:  Fall.  Head injury  EXAM: CT HEAD WITHOUT CONTRAST  CT MAXILLOFACIAL WITHOUT CONTRAST  TECHNIQUE: Multidetector CT imaging of the head and maxillofacial structures were performed using the standard protocol without intravenous contrast. Multiplanar CT image reconstructions of the maxillofacial structures were also generated.  COMPARISON:  CT head 06/05/2014  FINDINGS: CT HEAD FINDINGS  Left frontal  scalp hematoma.  Negative for fracture  Mild atrophy. Mild chronic microvascular ischemic change in the white matter. Negative for acute infarct. Negative for hemorrhage or mass. Atherosclerotic calcification.  CT MAXILLOFACIAL FINDINGS  Negative for facial fracture. Negative for orbital fracture. Nasal bone intact.  Calcification in the choroid of the right globe with a benign appearance.  Severe degenerative change in the TMJ bilaterally. No fracture of the mandible. Dental caries right upper bicuspid. Periapical lucency of right upper premolar.  Sinuses are clear.  No air-fluid level.  IMPRESSION: Head left frontal scalp hematoma. No acute intracranial abnormality.  Negative for facial fracture.   Electronically Signed   By: Franchot Gallo M.D.   On: 03/14/2015 14:02   Ct Maxillofacial Wo Cm  03/14/2015   CLINICAL DATA:  Fall.  Head injury  EXAM: CT HEAD WITHOUT CONTRAST  CT MAXILLOFACIAL WITHOUT CONTRAST  TECHNIQUE: Multidetector CT imaging of the head and maxillofacial structures were performed using the standard protocol without intravenous contrast. Multiplanar CT image reconstructions of the maxillofacial structures were also generated.  COMPARISON:  CT head 06/05/2014  FINDINGS: CT HEAD FINDINGS  Left frontal scalp hematoma.  Negative for fracture  Mild atrophy. Mild chronic microvascular ischemic change in the white matter. Negative for acute infarct. Negative for hemorrhage or mass. Atherosclerotic calcification.  CT MAXILLOFACIAL FINDINGS  Negative for facial fracture. Negative for orbital fracture. Nasal bone intact.  Calcification in the choroid of the right globe with a benign appearance.  Severe degenerative change in the TMJ bilaterally. No fracture of the mandible. Dental caries right upper bicuspid. Periapical lucency of right upper premolar.  Sinuses are clear.  No air-fluid level.  IMPRESSION: Head left frontal scalp hematoma. No acute intracranial abnormality.  Negative for facial fracture.    Electronically Signed   By: Franchot Gallo M.D.   On: 03/14/2015 14:02     EKG Interpretation None      MDM   Final diagnoses:  Fall  Facial contusion, initial encounter   Patient is alert and oriented 3 without gross neurological deficits. CT head and CT maxillofacial show no acute fractures. Discussed findings with patient.   I personally performed the services  described in this documentation, which was scribed in my presence. The recorded information has been reviewed and is accurate.      Tammy Christen, MD 03/14/15 [phone]

## 2015-03-15 ENCOUNTER — Other Ambulatory Visit: Payer: Self-pay

## 2015-03-15 MED ORDER — HYDROCODONE-ACETAMINOPHEN 10-325 MG PO TABS: 1.0000 | ORAL_TABLET | Freq: Four times a day (QID) | ORAL | Status: DC

## 2015-03-25 ENCOUNTER — Other Ambulatory Visit: Payer: Self-pay | Admitting: Family Medicine

## 2015-04-04 DIAGNOSIS — E1129 Type 2 diabetes mellitus with other diabetic kidney complication: Secondary | ICD-10-CM | POA: Diagnosis not present

## 2015-04-04 DIAGNOSIS — E039 Hypothyroidism, unspecified: Secondary | ICD-10-CM | POA: Diagnosis not present

## 2015-04-04 DIAGNOSIS — E785 Hyperlipidemia, unspecified: Secondary | ICD-10-CM | POA: Diagnosis not present

## 2015-04-05 LAB — COMPLETE METABOLIC PANEL WITH GFR
ALBUMIN: 3.7 g/dL (ref 3.5–5.2)
ALK PHOS: 55 U/L (ref 39–117)
ALT: 8 U/L (ref 0–35)
AST: 13 U/L (ref 0–37)
BILIRUBIN TOTAL: 0.4 mg/dL (ref 0.2–1.2)
BUN: 28 mg/dL — ABNORMAL HIGH (ref 6–23)
CALCIUM: 9.6 mg/dL (ref 8.4–10.5)
CO2: 26 meq/L (ref 19–32)
Chloride: 99 mEq/L (ref 96–112)
Creat: 1.49 mg/dL — ABNORMAL HIGH (ref 0.50–1.10)
GFR, EST AFRICAN AMERICAN: 41 mL/min — AB
GFR, EST NON AFRICAN AMERICAN: 36 mL/min — AB
Glucose, Bld: 128 mg/dL — ABNORMAL HIGH (ref 70–99)
Potassium: 4.5 mEq/L (ref 3.5–5.3)
SODIUM: 136 meq/L (ref 135–145)
TOTAL PROTEIN: 6.5 g/dL (ref 6.0–8.3)

## 2015-04-05 LAB — LIPID PANEL
Cholesterol: 137 mg/dL (ref 0–200)
HDL: 32 mg/dL — ABNORMAL LOW (ref >=46)
LDL CALC: 62 mg/dL (ref 0–99)
Total CHOL/HDL Ratio: 4.3 Ratio
Triglycerides: 215 mg/dL — ABNORMAL HIGH (ref <150)
VLDL: 43 mg/dL — AB (ref 0–40)

## 2015-04-05 LAB — HEMOGLOBIN A1C
HEMOGLOBIN A1C: 6.9 % — AB (ref <5.7)
MEAN PLASMA GLUCOSE: 151 mg/dL — AB (ref <117)

## 2015-04-05 LAB — TSH: TSH: 2.937 u[IU]/mL (ref 0.350–4.500)

## 2015-04-09 ENCOUNTER — Ambulatory Visit (INDEPENDENT_AMBULATORY_CARE_PROVIDER_SITE_OTHER): Payer: Medicare Other | Admitting: Family Medicine

## 2015-04-09 ENCOUNTER — Encounter: Payer: Self-pay | Admitting: Family Medicine

## 2015-04-09 VITALS — BP 120/70 | HR 84 | Resp 18 | Ht 65.0 in | Wt 214.0 lb

## 2015-04-09 DIAGNOSIS — E1129 Type 2 diabetes mellitus with other diabetic kidney complication: Secondary | ICD-10-CM

## 2015-04-09 DIAGNOSIS — F418 Other specified anxiety disorders: Secondary | ICD-10-CM

## 2015-04-09 DIAGNOSIS — F32A Depression, unspecified: Secondary | ICD-10-CM | POA: Insufficient documentation

## 2015-04-09 DIAGNOSIS — E785 Hyperlipidemia, unspecified: Secondary | ICD-10-CM

## 2015-04-09 DIAGNOSIS — F329 Major depressive disorder, single episode, unspecified: Secondary | ICD-10-CM | POA: Insufficient documentation

## 2015-04-09 DIAGNOSIS — E038 Other specified hypothyroidism: Secondary | ICD-10-CM

## 2015-04-09 DIAGNOSIS — Z0000 Encounter for general adult medical examination without abnormal findings: Secondary | ICD-10-CM | POA: Diagnosis not present

## 2015-04-09 MED ORDER — HYDROCODONE-ACETAMINOPHEN 10-325 MG PO TABS: 1.0000 | ORAL_TABLET | Freq: Three times a day (TID) | ORAL | Status: DC

## 2015-04-09 NOTE — Progress Notes (Signed)
Subjective:    Patient ID: Tammy Suarez, female    DOB: [birth year], 69 y.o.   MRN: [id number]  HPI Preventive Screening-Counseling & Management   Patient present here today for a Medicare annual wellness visit.   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 Ongoing nicotine use, no alcohol or drug use   Risk Factors  Current exercise habits:  Limited due to mobility problems, use of assistive device and high risk for falls  Dietary issues discussed:  Most meals prepared at home avoids fried foods and eats mostly vegetables and fruits   Cardiac risk factors: htn , established CAD, needs to make f/u appt, nicotine and diabetes  Depression Screen  (Note: if answer to either of the following is "Yes", a more complete depression screening is indicated)   Patient is in the process of being evaluated for back surgery and is having complications in getting this setup.  Over the past two weeks, have you felt down, depressed or hopeless? Yes  Over the past two weeks, have you felt little interest or pleasure in doing things? Yes Have you lost interest or pleasure in daily life? Yes Do you often feel hopeless? Yes Do you cry easily over simple problems? Yes  Activities of Daily Living  In your present state of health, do you have any difficulty performing the following activities?  Driving?: No Managing money?: No Feeding yourself?:No Getting from bed to chair?:yes Climbing a flight of stairs?: Yes, due to back pain and use of cane Preparing food and eating?:No Bathing or showering?:at times due to severe arthirtis in her spine Getting dressed?:No Getting to the toilet?:No Using the toilet?:No Moving around from place to place?:  Yes due to use of cane  Fall Risk Assessment In the past year have you fallen or had a near fall?: Yes Are  you currently taking any medications that make you dizzy?:sometimes feels light headed, but on multiple potentially sedating meds, dose of pain med is reduced   Hearing Difficulties: No Do you often ask people to speak up or repeat themselves?:No Do you experience ringing or noises in your ears?:No Do you have difficulty understanding soft or whispered voices?:No  Cognitive Testing  Alert? Yes Normal Appearance?Yes  Oriented to person? Yes Place? Yes  Time? Yes  Displays appropriate judgment?Yes  Can read the correct time from a watch face? yes Are you having problems remembering things? No   Advanced Directives have been discussed with the patient?Yes and brochure provided, currently full code   List the Names of Other Physician/Practitioners you currently use: updated    Indicate any recent Medical Services you may have received from other than [facility] providers in the past year (date may be approximate).   Assessment:    Annual Wellness Exam   Plan:    Medicare Attestation  I have personally reviewed:  The patient's medical and social history  Their use of alcohol, tobacco or illicit drugs  Their current medications and supplements  The patient's functional ability including ADLs,fall risks, home safety risks, cognitive, and hearing and visual impairment  Diet and physical activities  Evidence for depression or mood disorders  The patient's weight, height, BMI, and visual acuity have been recorded in the chart. I have made referrals, counseling, and provided education to the patient based on review of the above and I have provided the  patient with a written personalized care plan for preventive services.      Review of Systems     Objective:   Physical Exam BP 120/70 mmHg  Pulse 84  Resp 18  Ht 5\' 5"  (1.651 m)  Wt 214 lb (97.07 kg)  BMI 35.61 kg/m2  SpO2 96%        Assessment & Plan:  Medicare annual wellness visit, subsequent Annual exam as  documented. Counseling done  re healthy lifestyle involving commitment to daily physical activity , heart healthy diet, and attaining healthy weight.The importance of adequate sleep also discussed. Regular seat belt use and home safety, is also discussed. Changes in health habits are decided on by the patient with goals and time frames  set for achieving them. Immunization and cancer screening needs are specifically addressed at this visit.    Depression with anxiety Uncontrolled with significant family stressors, niot suicidal or homicidal, but high PHQ 9 score on max doses of 2 antidepressants, refer to psychiatry for assistance

## 2015-04-09 NOTE — Patient Instructions (Addendum)
nnual physical exam in 4.5 month, call if you need me before  You are referred to psychiatry for help with depression  Pls call and schedule cardiology follow up  Repeat chest scan needed 07/14 or after, you will be contacted about this  Reduced dose of pain meds to one 3 times daily  All the best with upcoming back surgery and careful   not to fall  Please reduce red meats, fried and fatty foods  Blood pressure is good  Thanks for choosing [facility], we consider it a privelige to serve you.   Fasting lipid, cmp and EGFr, HBA1C and TSH in 4.5 month

## 2015-04-17 ENCOUNTER — Telehealth: Payer: Self-pay | Admitting: Acute Care

## 2015-04-17 NOTE — Telephone Encounter (Signed)
I called and spoke with Ms. Dotson. I told her that her doctor, Dr. Tula Nakayama referred her to me for Lung Cancer Screening.I explained the program to her and asked her when she would like to come in for a shared decision making visit. She lives in [place], [place] and is in the process of scheduling a back surgery. I told her that if it would be easier for her to call me on a date that she is going to be in [place], that I would try and see her for the shared decision making visit on that day. She took my name and number and will call me the next time she is in [place] for a doctors appointment. I told her that she needed the scan before the end of July, as she had a scan last May 29, 2014, and we want to make sure we get one every year. She verbalized understanding. I will await her return call.

## 2015-04-18 ENCOUNTER — Encounter: Payer: Self-pay | Admitting: Family Medicine

## 2015-04-18 NOTE — Assessment & Plan Note (Signed)
Uncontrolled with significant family stressors, niot suicidal or homicidal, but high PHQ 9 score on max doses of 2 antidepressants, refer to psychiatry for assistance

## 2015-04-18 NOTE — Assessment & Plan Note (Signed)
Annual exam as documented. Counseling done  re healthy lifestyle involving commitment to daily physical activity , heart healthy diet, and attaining healthy weight.The importance of adequate sleep also discussed. Regular seat belt use and home safety, is also discussed. Changes in health habits are decided on by the patient with goals and time frames  set for achieving them. Immunization and cancer screening needs are specifically addressed at this visit.

## 2015-04-19 ENCOUNTER — Telehealth (HOSPITAL_COMMUNITY): Payer: Self-pay | Admitting: *Deleted

## 2015-04-26 ENCOUNTER — Encounter: Payer: Self-pay | Admitting: Cardiology

## 2015-04-26 ENCOUNTER — Ambulatory Visit (INDEPENDENT_AMBULATORY_CARE_PROVIDER_SITE_OTHER): Payer: Medicare Other | Admitting: Cardiology

## 2015-04-26 VITALS — BP 130/68 | HR 76 | Ht 65.0 in | Wt 207.0 lb

## 2015-04-26 DIAGNOSIS — Z79899 Other long term (current) drug therapy: Secondary | ICD-10-CM | POA: Diagnosis not present

## 2015-04-26 DIAGNOSIS — I10 Essential (primary) hypertension: Secondary | ICD-10-CM | POA: Diagnosis not present

## 2015-04-26 DIAGNOSIS — I2510 Atherosclerotic heart disease of native coronary artery without angina pectoris: Secondary | ICD-10-CM

## 2015-04-26 DIAGNOSIS — N183 Chronic kidney disease, stage 3 (moderate): Secondary | ICD-10-CM

## 2015-04-26 DIAGNOSIS — E785 Hyperlipidemia, unspecified: Secondary | ICD-10-CM

## 2015-04-26 MED ORDER — FUROSEMIDE 20 MG PO TABS: ORAL_TABLET | ORAL | Status: DC

## 2015-04-26 NOTE — Progress Notes (Signed)
Cardiology Office Note  Date: 04/26/2015   ID: [name], DOB 04/01/1946, MRN [id number]  PCP: Tammy Nakayama, MD  Primary Cardiologist: Tammy Lesches, MD   Chief Complaint  Patient presents with  . Coronary Artery Disease  . Hyperlipidemia    History of Present Illness: Tammy Suarez is a 69 y.o. female last seen in November 2015. She presents for a routine follow-up visit. She states that she has had trouble with leg edema for the last few months. No angina symptoms or increasing shortness of breath however. She is status post CABG in December 2014.  She reports chronic back pain, anticipates that she may need to have surgery with Dr. Trenton Suarez although this has not yet been scheduled.  Reviewed her cardiac medications, she continues on Toprol-XL, aspirin, Zocor, is not on a diuretic.  Recent lab work is noted below.   Past Medical History  Diagnosis Date  . Vitamin B 12 deficiency   . Chronic back pain   . GERD (gastroesophageal reflux disease)   . Hyperlipidemia   . Obesity   . Essential hypertension, benign   . Type 2 diabetes mellitus without complications   . History of conjunctivitis   . Coronary atherosclerosis of native coronary artery     Multivessel status post CABG - LIMA to LAD, SVG to OM and SVG to PDA  . CKD (chronic kidney disease) stage 3, GFR 30-59 ml/min     Past Surgical History  Procedure Laterality Date  . Cervical laminectomy    . Cholecystectomy    . Inguinal herniorrhaphy    . Vesicovaginal fistula closure w/ tah    . Lumbar fusion    . Left index finger repair    . Laparoscopic gastric banding  2009  . Cosmetic surgery  07/01/2012    Recurrent skin infection on lower abdomen, [facility]  . Abdominal hysterectomy    . Spine surgery    . Coronary artery bypass graft N/A 11/02/2013    Procedure: CORONARY ARTERY BYPASS GRAFTING (CABG);  Surgeon: Tammy Pollack, MD;  Location: [place];  Service: Open Heart Surgery;  Laterality: N/A;   CABG x three, using left internal mammary artery and right leg greater saphenous vein harvested endoscopically  . Intraoperative transesophageal echocardiogram N/A 11/02/2013    Procedure: INTRAOPERATIVE TRANSESOPHAGEAL ECHOCARDIOGRAM;  Surgeon: Tammy Pollack, MD;  Location: [facility] OR;  Service: Open Heart Surgery;  Laterality: N/A;  . Hernia repair    . Back surgery    . Cardiac surgery    . Left heart catheterization with coronary angiogram N/A 10/30/2013    Procedure: LEFT HEART CATHETERIZATION WITH CORONARY ANGIOGRAM;  Surgeon: Tammy Breeding, MD;  Location: [facility] CATH LAB;  Service: Cardiovascular;  Laterality: N/A;    Current Outpatient Prescriptions  Medication Sig Dispense Refill  . amitriptyline (ELAVIL) 25 MG tablet TAKE 1 TABLET BY MOUTH AT BEDTIME 30 tablet 4  . aspirin EC 81 MG tablet Take 81 mg by mouth daily.    . Aspirin-Acetaminophen-Caffeine (GOODY HEADACHE PO) Take 1 Package by mouth 2 (two) times daily as needed (headache).    Marland Kitchen buPROPion (WELLBUTRIN XL) 300 MG 24 hr tablet TAKE 1 TABLET BY MOUTH EVERY MORNING 90 tablet 1  . Choline Fenofibrate (FENOFIBRIC ACID) 45 MG CPDR TAKE ONE CAPSULE BY MOUTH EVERY DAY *10/7* 90 capsule 1  . clonazePAM (KLONOPIN) 1 MG tablet TAKE 1 TABLET BY MOUTH 3 TIMES A DAY. DUE 11/26/2014 90 tablet 2  . DULoxetine (CYMBALTA) 60  MG capsule TAKE ONE CAPSULE BY MOUTH TWICE A DAY 180 capsule 1  . HYDROcodone-acetaminophen (NORCO) 10-325 MG per tablet Take 1 tablet by mouth 3 (three) times daily. 90 tablet 0  . levothyroxine (SYNTHROID, LEVOTHROID) 75 MCG tablet TAKE 1 TABLET BY MOUTH EVERY DAY *DOSE CHANGE* 90 tablet 1  . loratadine (CLARITIN) 10 MG tablet Take 1 tablet (10 mg total) by mouth daily as needed for allergies. 30 tablet 6  . metoprolol succinate (TOPROL-XL) 25 MG 24 hr tablet TAKE 1 TABLET EVERY DAY 30 tablet 3  . Multiple Vitamin (MULTIVITAMIN) tablet Take 1 tablet by mouth daily.    . nitroGLYCERIN (NITROSTAT) 0.4 MG SL tablet Place 1 tablet  (0.4 mg total) under the tongue every 5 (five) minutes as needed for chest pain. 25 tablet 3  . Omega-3 Fatty Acids (FISH OIL PO) Take 1 capsule by mouth daily.    . ranitidine (ZANTAC) 150 MG tablet TAKE 1 TABLET BY MOUTH TWICE A DAY *9/22* 180 tablet 1  . simvastatin (ZOCOR) 40 MG tablet TAKE 1 TABLET BY MOUTH AT BEDTIME *DISCONTINUE SIMVASTATIN 20MG * *9/20* 90 tablet 1  . VESICARE 10 MG tablet TAKE 1 TABLET BY MOUTH EVERY DAY 90 tablet 1  . furosemide (LASIX) 20 MG tablet Take daily as needed for leg swelling 30 tablet 6   No current facility-administered medications for this visit.    Allergies:  Meperidine hcl; Niacin; and Propoxyphene n-acetaminophen   Social History: The patient  reports that she has been smoking Cigarettes.  She has been smoking about 0.50 packs per day. She quit smokeless tobacco use about 5 months ago. She reports that she does not drink alcohol or use illicit drugs.   ROS:  Please see the history of present illness. Otherwise, complete review of systems is positive for none.  All other systems are reviewed and negative.   Physical Exam: VS:  BP 130/68 mmHg  Pulse 76  Ht 5\' 5"  (1.651 m)  Wt 207 lb (93.895 kg)  BMI 34.45 kg/m2  SpO2 98%, BMI Body mass index is 34.45 kg/(m^2).  Wt Readings from Last 3 Encounters:  04/26/15 207 lb (93.895 kg)  04/09/15 214 lb (97.07 kg)  03/14/15 206 lb (93.441 kg)     Overweight woman, appears comfortable at rest.  HEENT: Conjunctiva and lids normal, oropharynx clear.  Neck: Supple, no elevated JVP or carotid bruits, no thyromegaly.  Lungs: Clear to auscultation, nonlabored breathing at rest.  Cardiac: Regular rate and rhythm, no S3 or significant systolic murmur, no pericardial rub.  Abdomen: Soft, nontender, bowel sounds present, no guarding or rebound.  Extremities: No pitting edema, distal pulses 2+.    ECG: ECG is not ordered today.  Recent Labwork: 04/04/2015: ALT 8; AST 13; BUN 28*; Creat 1.49*; Potassium  4.5; Sodium 136; TSH 2.937     Component Value Date/Time   CHOL 137 04/04/2015 1147   TRIG 215* 04/04/2015 1147   HDL 32* 04/04/2015 1147   CHOLHDL 4.3 04/04/2015 1147   VLDL 43* 04/04/2015 1147   [place] 62 04/04/2015 1147    Other Studies Reviewed Today:  Echocardiogram from December 2014 showed LVEF 55-60% with no regional wall motion abnormalities, diastolic dysfunction, no major valvular abnormalities.  ASSESSMENT AND PLAN:  1. Multivessel CAD status post CABG in December 2014. She has no angina symptoms on current medical regimen. LVEF 55-60% by assessment with diastolic dysfunction, she is reporting some intermittent leg edema. We will provide Lasix 20 mg to be taken as  needed, follow-up BMET in a few weeks.  2. CKD, stage III, creatinine 1.49 by recent assessment.  3. Hyperlipidemia, on statin therapy, recent LDL 62.  4. Essential hypertension with reasonable blood pressure control today.  Current medicines were reviewed at length with the patient today.   Orders Placed This Encounter  Procedures  . Basic Metabolic Panel (BMET)    Disposition: FU with me in 6 months.   Signed, Satira Sark, MD, [facility] 04/26/2015 4:00 PM    [place] [facility] [address] [place], [place] [address] Phone: [phone]; Fax: [phone]

## 2015-04-26 NOTE — Patient Instructions (Addendum)
Your physician wants you to follow-up in: 6 months with Dr Ferne Reus will receive a reminder letter in the mail two months in advance. If you don't receive a letter, please call our office to schedule the follow-up appointment.   Take Lasix 20 mg daily as needed for leg swelling, in 2 WEEKS 6/24, please get lab work:BMET      Thank you for choosing [facility] !

## 2015-05-03 ENCOUNTER — Other Ambulatory Visit: Payer: Self-pay

## 2015-05-03 ENCOUNTER — Other Ambulatory Visit: Payer: Self-pay | Admitting: Family Medicine

## 2015-05-03 MED ORDER — HYDROCODONE-ACETAMINOPHEN 10-325 MG PO TABS: 1.0000 | ORAL_TABLET | Freq: Three times a day (TID) | ORAL | Status: DC

## 2015-05-04 ENCOUNTER — Other Ambulatory Visit: Payer: Self-pay | Admitting: Family Medicine

## 2015-05-07 ENCOUNTER — Other Ambulatory Visit: Payer: Self-pay | Admitting: Family Medicine

## 2015-05-10 ENCOUNTER — Telehealth: Payer: Self-pay | Admitting: Acute Care

## 2015-05-10 DIAGNOSIS — Z79899 Other long term (current) drug therapy: Secondary | ICD-10-CM | POA: Diagnosis not present

## 2015-05-10 NOTE — Telephone Encounter (Signed)
I called to check in with Tammy Suarez as I have not had a return call from her per our phone conversation on 04/17/15 in which she told me she would call me when she knew she was going to be in [place]. I have left another message for her to return my call so we can get her screening appointment and scan scheduled. I will await her return call.

## 2015-05-11 LAB — BASIC METABOLIC PANEL
BUN: 37 mg/dL — ABNORMAL HIGH (ref 6–23)
CALCIUM: 9.8 mg/dL (ref 8.4–10.5)
CHLORIDE: 96 meq/L (ref 96–112)
CO2: 30 mEq/L (ref 19–32)
CREATININE: 1.62 mg/dL — AB (ref 0.50–1.10)
Glucose, Bld: 164 mg/dL — ABNORMAL HIGH (ref 70–99)
POTASSIUM: 4.8 meq/L (ref 3.5–5.3)
SODIUM: 137 meq/L (ref 135–145)

## 2015-05-14 ENCOUNTER — Other Ambulatory Visit: Payer: Self-pay | Admitting: Acute Care

## 2015-05-14 ENCOUNTER — Telehealth: Payer: Self-pay | Admitting: Acute Care

## 2015-05-14 DIAGNOSIS — Z87891 Personal history of nicotine dependence: Secondary | ICD-10-CM

## 2015-05-14 NOTE — Telephone Encounter (Signed)
Tammy Suarez called and scheduled her lung cancer screening. She is scheduled for 05/21/15 at 10am for her shared decision making visit, and 05/21/15 at 1130 at the [facility] for the [place]. She verbalized understanding of both appointment times and locations. She has my contact information in the event she has any questions before I see her for her appointment.

## 2015-05-21 ENCOUNTER — Telehealth: Payer: Self-pay | Admitting: Acute Care

## 2015-05-21 ENCOUNTER — Ambulatory Visit (HOSPITAL_COMMUNITY)
Admission: RE | Admit: 2015-05-21 | Discharge: 2015-05-21 | Disposition: A | Discharge status: Home / Self Care | Payer: Medicare Other | Source: Ambulatory Visit | Attending: Acute Care | Admitting: Acute Care

## 2015-05-21 ENCOUNTER — Encounter: Payer: Self-pay | Admitting: Acute Care

## 2015-05-21 ENCOUNTER — Ambulatory Visit (INDEPENDENT_AMBULATORY_CARE_PROVIDER_SITE_OTHER): Payer: Medicare Other | Admitting: Acute Care

## 2015-05-21 DIAGNOSIS — F17210 Nicotine dependence, cigarettes, uncomplicated: Secondary | ICD-10-CM | POA: Insufficient documentation

## 2015-05-21 DIAGNOSIS — Z951 Presence of aortocoronary bypass graft: Secondary | ICD-10-CM | POA: Diagnosis not present

## 2015-05-21 DIAGNOSIS — Z122 Encounter for screening for malignant neoplasm of respiratory organs: Secondary | ICD-10-CM | POA: Insufficient documentation

## 2015-05-21 DIAGNOSIS — Z87891 Personal history of nicotine dependence: Secondary | ICD-10-CM

## 2015-05-21 DIAGNOSIS — J439 Emphysema, unspecified: Secondary | ICD-10-CM | POA: Diagnosis not present

## 2015-05-21 NOTE — Progress Notes (Signed)
Shared Decision Making Visit Lung Cancer Screening Program [phone])   Eligibility:  Age 69 y.o.  Pack Years Smoking History Calculation : 30+ (# packs/per year x # years smoked)  Recent History of coughing up blood  no  Unexplained weight loss? no ( >Than 15 pounds within the last 6 months )  Prior History Lung / other cancer no (Diagnosis within the last 5 years already requiring surveillance chest CT Scans).  Smoking Status Current Smoker  Former Smokers: Years since quit: NA  Quit Date: NA  Visit Components:  Discussion included one or more decision making aids. yes  Discussion included risk/benefits of screening. yes  Discussion included potential follow up diagnostic testing for abnormal scans. yes  Discussion included meaning and risk of over diagnosis. yes  Discussion included meaning and risk of False Positives. yes  Discussion included meaning of total radiation exposure. yes  Counseling Included:  Importance of adherence to annual lung cancer LDCT screening. yes  Impact of comorbidities on ability to participate in the program. yes  Ability and willingness to under diagnostic treatment. yes  Smoking Cessation Counseling:  Current Smokers:   Discussed importance of smoking cessation. yes  Information about tobacco cessation classes and interventions provided to patient. yes  Patient provided with "ticket" for LDCT Scan. yes  Symptomatic Patient. no  Counseling: NA  Diagnosis Code: Tobacco Use Z72.0  Asymptomatic Patient yes  Counseling : Yes  Former Smokers:   Discussed the importance of maintaining cigarette abstinence.NA  Diagnosis Code: Personal History of Nicotine Dependence. [id number]  Information about tobacco cessation classes and interventions provided to patient. Yes  Patient provided with "ticket" for LDCT Scan. yes  Written Order for Lung Cancer Screening with LDCT placed in Epic. Yes (CT Chest Lung Cancer Screening Low Dose  W/O CM) [id number] Z12.2-Screening of respiratory organs Z87.891-Personal history of nicotine dependence  I spent 15 minutes of face to face time with Tammy Suarez discussing the risks and benefits of Lung Cancer Screening . We viewed a power point together which highlighted the program and the elements noted above. We spent time on each slide, allowing for time to discuss and ask and answer questions about the program.We discussed that the single most powerful thing she could do to decrease her risk of lung cancer was to stop smoking. She verbalized understanding. When asked if she was ready to quit she stated that she was not ready. I gave her a " Be stronger than your excuses " card along with my business card and told her to call me when she is ready to quit.I told her I would help her with nicotine replacement therapy, non-nicotine medications, and support groups and resources. I encouraged her to set small goals for herself, and when she is ready we can develop a plan for her to follow. She verbalized understanding. Take aways from the appointment are a copy of the power point we reviewed together, the " Be stronger than your excuses " card, my card and contact information, and the ticket to provide to the CT scan tech. She has an appointment at the [facility] at 12:45 today. She verbalized understanding of location and time of the appointment, and knows to give the "ticket to ride" to the [place]. Tammy Suarez had no further questions about the program.She has my contact information in the event she has any questions in the future.  Tammy Spatz, NP

## 2015-05-21 NOTE — Telephone Encounter (Signed)
I called to give Tammy Suarez the results of her screening CT. There was no answer. I have left a message just asking that she return my call for results. I will await her return call.

## 2015-05-22 ENCOUNTER — Telehealth: Payer: Self-pay | Admitting: Acute Care

## 2015-05-22 NOTE — Telephone Encounter (Signed)
I called Mrs Tammy Suarez again this morning to give her the results of her CT scan. I left a message for her to return my call again. She had called last night and left a message in response to the call I made yesterday evening. I will await her  Return call.

## 2015-05-23 ENCOUNTER — Telehealth: Payer: Self-pay | Admitting: Acute Care

## 2015-05-23 NOTE — Telephone Encounter (Signed)
I called [name] to give her the results of her low dose screening CT scan. I told her that the results were unchanged from 12 months ago, Lung RADS 1,no nodules, or definitely benign nodules. She verbalized understanding. I told her recommendation were for a repeat scan in 12 months, and that we would call and schedule her in June of 2017 for the next scan. I told her that if she had any changes in her health, specifically unexplained weight loss or coughing up blood to notify her primary care doctor so that follow up steps can be initiated earlier than her next scan. She verbalized understanding. Tammy Suarez has my contact information in the event she has any further questions in the future.

## 2015-05-30 DIAGNOSIS — Z6835 Body mass index (BMI) 35.0-35.9, adult: Secondary | ICD-10-CM | POA: Diagnosis not present

## 2015-05-30 DIAGNOSIS — I10 Essential (primary) hypertension: Secondary | ICD-10-CM | POA: Diagnosis not present

## 2015-05-30 DIAGNOSIS — M4316 Spondylolisthesis, lumbar region: Secondary | ICD-10-CM | POA: Diagnosis not present

## 2015-06-04 ENCOUNTER — Ambulatory Visit (HOSPITAL_COMMUNITY): Payer: Self-pay | Admitting: Psychiatry

## 2015-06-17 ENCOUNTER — Other Ambulatory Visit: Payer: Self-pay

## 2015-06-17 MED ORDER — HYDROCODONE-ACETAMINOPHEN 10-325 MG PO TABS: 1.0000 | ORAL_TABLET | Freq: Three times a day (TID) | ORAL | Status: DC

## 2015-06-23 ENCOUNTER — Other Ambulatory Visit: Payer: Self-pay | Admitting: Family Medicine

## 2015-06-23 ENCOUNTER — Telehealth: Payer: Self-pay | Admitting: Family Medicine

## 2015-06-23 DIAGNOSIS — E038 Other specified hypothyroidism: Secondary | ICD-10-CM

## 2015-06-23 DIAGNOSIS — E785 Hyperlipidemia, unspecified: Secondary | ICD-10-CM

## 2015-06-23 DIAGNOSIS — I10 Essential (primary) hypertension: Secondary | ICD-10-CM

## 2015-06-23 DIAGNOSIS — E1129 Type 2 diabetes mellitus with other diabetic kidney complication: Secondary | ICD-10-CM

## 2015-06-23 NOTE — Telephone Encounter (Signed)
Ps contact pt, ask her to ge labs ordered in May th week of Sept 19, and you NEED to add CBC, none ion 2 years and also a vit D, so NEW ORDER needed Her next appt is Nov so need the labs done also TSH past due an on med Also explain that to reduce fall risk I am discontinuing unless she can specifically explain how the med is helping her , and even then I recommend stopping and seeing how she does off th med She has had multiple falls and her insurance/ pharmacy has sent me notification. The dose is VER low so she should not miss the medThe form to be signed and faxed to the ins is in your area , an i am discontinuing the med  ?? pls ask

## 2015-07-03 NOTE — Addendum Note (Signed)
Addended by: Denman George B on: 07/03/2015 03:59 PM   Modules accepted: Orders

## 2015-07-03 NOTE — Telephone Encounter (Signed)
Called and left message for patient to return call.  Labs ordered and mailed to home address with approx date needed.

## 2015-07-08 ENCOUNTER — Other Ambulatory Visit: Payer: Self-pay | Admitting: Family Medicine

## 2015-07-12 ENCOUNTER — Other Ambulatory Visit: Payer: Self-pay

## 2015-07-12 MED ORDER — HYDROCODONE-ACETAMINOPHEN 10-325 MG PO TABS: 1.0000 | ORAL_TABLET | Freq: Three times a day (TID) | ORAL | Status: DC

## 2015-07-17 ENCOUNTER — Other Ambulatory Visit: Payer: Self-pay

## 2015-07-17 DIAGNOSIS — M549 Dorsalgia, unspecified: Secondary | ICD-10-CM

## 2015-07-17 MED ORDER — METOPROLOL SUCCINATE ER 25 MG PO TB24: 25.0000 mg | ORAL_TABLET | Freq: Every day | ORAL | Status: DC

## 2015-07-17 MED ORDER — GABAPENTIN 300 MG PO CAPS: 300.0000 mg | ORAL_CAPSULE | Freq: Three times a day (TID) | ORAL | Status: DC

## 2015-08-04 ENCOUNTER — Other Ambulatory Visit: Payer: Self-pay | Admitting: Family Medicine

## 2015-08-08 ENCOUNTER — Other Ambulatory Visit: Payer: Self-pay | Admitting: Family Medicine

## 2015-08-13 DIAGNOSIS — M4806 Spinal stenosis, lumbar region: Secondary | ICD-10-CM | POA: Diagnosis not present

## 2015-08-13 DIAGNOSIS — M4316 Spondylolisthesis, lumbar region: Secondary | ICD-10-CM | POA: Diagnosis not present

## 2015-08-14 ENCOUNTER — Other Ambulatory Visit: Payer: Self-pay | Admitting: Family Medicine

## 2015-08-15 ENCOUNTER — Other Ambulatory Visit: Payer: Self-pay

## 2015-08-15 MED ORDER — HYDROCODONE-ACETAMINOPHEN 10-325 MG PO TABS: 1.0000 | ORAL_TABLET | Freq: Three times a day (TID) | ORAL | Status: DC

## 2015-08-21 ENCOUNTER — Other Ambulatory Visit: Payer: Self-pay | Admitting: Family Medicine

## 2015-08-22 ENCOUNTER — Other Ambulatory Visit: Payer: Self-pay | Admitting: Family Medicine

## 2015-08-22 DIAGNOSIS — Z1231 Encounter for screening mammogram for malignant neoplasm of breast: Secondary | ICD-10-CM

## 2015-09-06 ENCOUNTER — Ambulatory Visit (HOSPITAL_COMMUNITY): Payer: Self-pay

## 2015-09-11 ENCOUNTER — Ambulatory Visit (HOSPITAL_COMMUNITY)
Admission: RE | Admit: 2015-09-11 | Discharge: 2015-09-11 | Disposition: A | Discharge status: Home / Self Care | Payer: Medicare Other | Source: Ambulatory Visit | Attending: Family Medicine | Admitting: Family Medicine

## 2015-09-11 DIAGNOSIS — I10 Essential (primary) hypertension: Secondary | ICD-10-CM | POA: Diagnosis not present

## 2015-09-11 DIAGNOSIS — E1129 Type 2 diabetes mellitus with other diabetic kidney complication: Secondary | ICD-10-CM | POA: Diagnosis not present

## 2015-09-11 DIAGNOSIS — Z1231 Encounter for screening mammogram for malignant neoplasm of breast: Secondary | ICD-10-CM | POA: Insufficient documentation

## 2015-09-11 DIAGNOSIS — E785 Hyperlipidemia, unspecified: Secondary | ICD-10-CM | POA: Diagnosis not present

## 2015-09-11 DIAGNOSIS — E038 Other specified hypothyroidism: Secondary | ICD-10-CM | POA: Diagnosis not present

## 2015-09-12 DIAGNOSIS — M4806 Spinal stenosis, lumbar region: Secondary | ICD-10-CM | POA: Diagnosis not present

## 2015-09-12 LAB — COMPLETE METABOLIC PANEL WITH GFR
ALT: 9 U/L (ref 6–29)
AST: 14 U/L (ref 10–35)
Albumin: 3.8 g/dL (ref 3.6–5.1)
Alkaline Phosphatase: 61 U/L (ref 33–130)
BUN: 23 mg/dL (ref 7–25)
CO2: 28 mmol/L (ref 20–31)
Calcium: 9.9 mg/dL (ref 8.6–10.4)
Chloride: 99 mmol/L (ref 98–110)
Creat: 1.39 mg/dL — ABNORMAL HIGH (ref 0.50–0.99)
GFR, Est African American: 45 mL/min — ABNORMAL LOW (ref >=60)
GFR, Est Non African American: 39 mL/min — ABNORMAL LOW (ref >=60)
GLUCOSE: 127 mg/dL — AB (ref 65–99)
POTASSIUM: 4.5 mmol/L (ref 3.5–5.3)
SODIUM: 136 mmol/L (ref 135–146)
Total Bilirubin: 0.4 mg/dL (ref 0.2–1.2)
Total Protein: 6.6 g/dL (ref 6.1–8.1)

## 2015-09-12 LAB — LIPID PANEL
CHOL/HDL RATIO: 4.2 ratio (ref <=5.0)
Cholesterol: 159 mg/dL (ref 125–200)
HDL: 38 mg/dL — ABNORMAL LOW (ref >=46)
LDL Cholesterol: 85 mg/dL (ref <130)
Triglycerides: 178 mg/dL — ABNORMAL HIGH (ref <150)
VLDL: 36 mg/dL — ABNORMAL HIGH (ref <30)

## 2015-09-12 LAB — CBC
HCT: 36.1 % (ref 36.0–46.0)
Hemoglobin: 12.2 g/dL (ref 12.0–15.0)
MCH: 30.7 pg (ref 26.0–34.0)
MCHC: 33.8 g/dL (ref 30.0–36.0)
MCV: 90.7 fL (ref 78.0–100.0)
MPV: 9.8 fL (ref 8.6–12.4)
Platelets: 286 10*3/uL (ref 150–400)
RBC: 3.98 MIL/uL (ref 3.87–5.11)
RDW: 13.1 % (ref 11.5–15.5)
WBC: 5.8 10*3/uL (ref 4.0–10.5)

## 2015-09-12 LAB — HEMOGLOBIN A1C
Hgb A1c MFr Bld: 7.2 % — ABNORMAL HIGH (ref <5.7)
MEAN PLASMA GLUCOSE: 160 mg/dL — AB (ref <117)

## 2015-09-12 LAB — TSH: TSH: 2.885 u[IU]/mL (ref 0.350–4.500)

## 2015-09-13 ENCOUNTER — Other Ambulatory Visit: Payer: Self-pay

## 2015-09-13 MED ORDER — HYDROCODONE-ACETAMINOPHEN 10-325 MG PO TABS: 1.0000 | ORAL_TABLET | Freq: Three times a day (TID) | ORAL | Status: DC

## 2015-09-18 DIAGNOSIS — Z6836 Body mass index (BMI) 36.0-36.9, adult: Secondary | ICD-10-CM | POA: Diagnosis not present

## 2015-09-18 DIAGNOSIS — M4806 Spinal stenosis, lumbar region: Secondary | ICD-10-CM | POA: Diagnosis not present

## 2015-09-18 DIAGNOSIS — I10 Essential (primary) hypertension: Secondary | ICD-10-CM | POA: Diagnosis not present

## 2015-09-22 ENCOUNTER — Other Ambulatory Visit: Payer: Self-pay | Admitting: Family Medicine

## 2015-10-02 ENCOUNTER — Encounter: Payer: Self-pay | Admitting: Family Medicine

## 2015-10-02 ENCOUNTER — Ambulatory Visit (INDEPENDENT_AMBULATORY_CARE_PROVIDER_SITE_OTHER): Payer: Medicare Other | Admitting: Family Medicine

## 2015-10-02 VITALS — BP 120/74 | HR 69 | Resp 16 | Ht 65.0 in | Wt 207.1 lb

## 2015-10-02 DIAGNOSIS — M549 Dorsalgia, unspecified: Secondary | ICD-10-CM | POA: Diagnosis not present

## 2015-10-02 DIAGNOSIS — I2510 Atherosclerotic heart disease of native coronary artery without angina pectoris: Secondary | ICD-10-CM

## 2015-10-02 DIAGNOSIS — Z1159 Encounter for screening for other viral diseases: Secondary | ICD-10-CM

## 2015-10-02 DIAGNOSIS — E785 Hyperlipidemia, unspecified: Secondary | ICD-10-CM | POA: Diagnosis not present

## 2015-10-02 DIAGNOSIS — E1129 Type 2 diabetes mellitus with other diabetic kidney complication: Secondary | ICD-10-CM | POA: Diagnosis not present

## 2015-10-02 DIAGNOSIS — I10 Essential (primary) hypertension: Secondary | ICD-10-CM | POA: Diagnosis not present

## 2015-10-02 DIAGNOSIS — F418 Other specified anxiety disorders: Secondary | ICD-10-CM

## 2015-10-02 DIAGNOSIS — E038 Other specified hypothyroidism: Secondary | ICD-10-CM

## 2015-10-02 DIAGNOSIS — Z23 Encounter for immunization: Secondary | ICD-10-CM | POA: Diagnosis not present

## 2015-10-02 DIAGNOSIS — N39498 Other specified urinary incontinence: Secondary | ICD-10-CM

## 2015-10-02 DIAGNOSIS — D229 Melanocytic nevi, unspecified: Secondary | ICD-10-CM

## 2015-10-02 DIAGNOSIS — F17218 Nicotine dependence, cigarettes, with other nicotine-induced disorders: Secondary | ICD-10-CM

## 2015-10-02 DIAGNOSIS — F411 Generalized anxiety disorder: Secondary | ICD-10-CM

## 2015-10-02 DIAGNOSIS — R296 Repeated falls: Secondary | ICD-10-CM

## 2015-10-02 MED ORDER — FENOFIBRATE 145 MG PO TABS: 145.0000 mg | ORAL_TABLET | Freq: Every day | ORAL | Status: DC

## 2015-10-02 MED ORDER — GABAPENTIN 300 MG PO CAPS: 300.0000 mg | ORAL_CAPSULE | Freq: Every day | ORAL | Status: DC

## 2015-10-02 MED ORDER — SOLIFENACIN SUCCINATE 10 MG PO TABS: 10.0000 mg | ORAL_TABLET | Freq: Every day | ORAL | Status: DC

## 2015-10-02 NOTE — Assessment & Plan Note (Signed)
Worse off of medication , vesicare re sent

## 2015-10-02 NOTE — Assessment & Plan Note (Signed)
Unchanged Patient re-educated about  the importance of commitment to a  minimum of 150 minutes of exercise per week.  The importance of healthy food choices with portion control discussed. Encouraged to start a food diary, count calories and to consider  joining a support group. Sample diet sheets offered. Goals set by the patient for the next several months.   Weight /BMI 10/02/2015 04/26/2015 04/09/2015  WEIGHT 207 lb 1.9 oz 207 lb 214 lb  HEIGHT 5\' 5"  5\' 5"  5\' 5"   BMI 34.47 kg/m2 34.45 kg/m2 35.61 kg/m2    Current exercise per week 30 minutes.

## 2015-10-02 NOTE — Assessment & Plan Note (Signed)
Uncontrolled, has been  Getting epidurals not much benefit, may need surgery Start gabapentin at bedtime

## 2015-10-02 NOTE — Assessment & Plan Note (Signed)
Controlled, no change in management Tammy Suarez is reminded of the importance of commitment to daily physical activity for 30 minutes or more, as able and the need to limit carbohydrate intake to 30 to 60 grams per meal to help with blood sugar control.   Tammy Suarez is reminded of the importance of daily foot exam, annual eye examination, and good blood sugar, blood pressure and cholesterol control.  Diabetic Labs Latest Ref Rng 09/11/2015 05/10/2015 04/04/2015 01/30/2015 11/19/2014  HbA1c <5.7 % 7.2(H) - 6.9(H) - 6.8(H)  Microalbumin <2.0 mg/dL - - - - -  Micro/Creat Ratio 0.0 - 30.0 mg/g - - - - -  Chol 125 - 200 mg/dL 159 - 137 - 149  HDL >=46 mg/dL 38(L) - 32(L) - 39(L)  Calc LDL <130 mg/dL 85 - 62 - 80  Triglycerides <150 mg/dL 178(H) - 215(H) - 150(H)  Creatinine 0.50 - 0.99 mg/dL 1.39(H) 1.62(H) 1.49(H) 1.68(H) 1.57(H)   BP/Weight 10/02/2015 04/26/2015 04/09/2015 03/14/2015 02/18/2015 01/30/2015 [id number]  Systolic BP 123456 [id number] - 0000000 123456  Diastolic BP 74 68 70 94 - 72 78  Wt. (Lbs) 207.12 207 214 206 212 206.04 210  BMI 34.47 34.45 35.61 34.28 36.37 34.29 34.95   Foot/eye exam completion dates Latest Ref Rng 10/02/2015 09/03/2014  Eye Exam No Retinopathy - -  Foot exam Order - - -  Foot Form Completion - Done Done    Slight inc , however has received 2 epidurals in past 8 to 10 weeks

## 2015-10-02 NOTE — Assessment & Plan Note (Signed)
unchnaged , will attempt to cut back by one cigarette every 2 weeks Patient counseled for approximately 5 minutes regarding the health risks of ongoing nicotine use, specifically all types of cancer, heart disease, stroke and respiratory failure. The options available for help with cessation ,the behavioral changes to assist the process, and the option to either gradully reduce usage  Or abruptly stop.is also discussed. Pt is also encouraged to set specific goals in number of cigarettes used daily, as well as to set a quit date.  Number of cigarettes/cigars currently smoking daily:10

## 2015-10-02 NOTE — Assessment & Plan Note (Signed)
Controlled, no change in medication  

## 2015-10-02 NOTE — Assessment & Plan Note (Signed)
Home safety reviewed, needs to use asistive device at all times, multiple falls , and has significant lower ext weakness ans numbness

## 2015-10-02 NOTE — Assessment & Plan Note (Signed)
Ongoing stress, grand daughter who is 77 is addicted to drugs. Pt not suicidal or homicidal, dealing with the situation as best able and trying to get help for her grandchild, no indication for therapy or med change at this time

## 2015-10-02 NOTE — Assessment & Plan Note (Signed)
Controlled, no change in medication DASH diet and commitment to daily physical activity for a minimum of 30 minutes discussed and encouraged, as a part of hypertension management. The importance of attaining a healthy weight is also discussed.  BP/Weight 10/02/2015 04/26/2015 04/09/2015 03/14/2015 02/18/2015 01/30/2015 [id number]  Systolic BP 123456 [id number] 123456 Q000111Q - 0000000 123456  Diastolic BP 74 68 70 94 - 72 78  Wt. (Lbs) 207.12 207 214 206 212 206.04 210  BMI 34.47 34.45 35.61 34.28 36.37 34.29 34.95

## 2015-10-02 NOTE — Assessment & Plan Note (Addendum)
Improved but nott at goal, increase fenofibrate dose Hyperlipidemia:Low fat diet discussed and encouraged.   Lipid Panel  Lab Results  Component Value Date   CHOL 159 09/11/2015   HDL 38* 09/11/2015   LDLCALC 85 09/11/2015   TRIG 178* 09/11/2015   CHOLHDL 4.2 09/11/2015      Updated lab needed at/ before next visit.

## 2015-10-02 NOTE — Progress Notes (Signed)
Subjective:    Patient ID: Tammy Suarez, female    DOB: 06-10-1946, 69 y.o.   MRN: [id number]  HPI   Tammy Suarez     MRN: [id number]      DOB: 08-Jul-1946   HPI Tammy Suarez is here for follow up and re-evaluation of chronic medical conditions, medication management and review of any available recent lab and radiology data.  Preventive health is updated, specifically  Cancer screening and Immunization.   Questions or concerns regarding consultations or procedures which the Tammy Suarez has had in the interim are  Addressed.Has had 2 epidurals , not much improvement in lower ext pain, and may need surgery The Tammy Suarez denies any adverse reactions to current medications since the last visit.  Tender area on right posterior neck since yesterday    ROS Denies recent fever or chills. Denies sinus pressure, nasal congestion, ear pain or sore throat. Denies chest congestion, productive cough or wheezing. Denies chest pains, palpitations and leg swelling Denies abdominal pain, nausea, vomiting,diarrhea or constipation.   Denies dysuria, frequency, hesitancy or incontinence.  Denies headaches, seizures, numbness, or tingling. Denies uncontrolled  depression, anxiety or insomnia.Increased stress at home due to addiction problems with her grand daughter   PE  BP 120/74 mmHg  Pulse 69  Resp 16  Ht 5\' 5"  (1.651 m)  Wt 207 lb 1.9 oz (93.949 kg)  BMI 34.47 kg/m2  SpO2 97%  Patient alert and oriented and in no cardiopulmonary distress.  HEENT: No facial asymmetry, EOMI,   oropharynx pink and moist.  Neck supple no JVD, no mass.  Chest: Clear to auscultation bilaterally.Decreased though adequate air entry  CVS: S1, S2 no murmurs, no S3.Regular rate.  ABD: Soft non tender.   Ext: No edema  MS: Adequate ROM spine, shoulders, hips and knees.  Skin: Intact, tender mole on right posterior neck Psych: Good eye contact, normal affect. Memory intact not anxious or depressed appearing.  CNS: CN  2-12 intact, power,  normal throughout.no focal deficits noted.   Assessment & Plan  Back pain with radiation Uncontrolled, has been  Getting epidurals not much benefit, may need surgery Start gabapentin at bedtime  Hyperlipidemia with target LDL less than 70 Improved but nott at goal, increase fenofibrate dose Hyperlipidemia:Low fat diet discussed and encouraged.   Lipid Panel  Lab Results  Component Value Date   CHOL 159 09/11/2015   HDL 38* 09/11/2015   LDLCALC 85 09/11/2015   TRIG 178* 09/11/2015   CHOLHDL 4.2 09/11/2015      Updated lab needed at/ before next visit.   Essential hypertension, benign Controlled, no change in medication DASH diet and commitment to daily physical activity for a minimum of 30 minutes discussed and encouraged, as a part of hypertension management. The importance of attaining a healthy weight is also discussed.  BP/Weight 10/02/2015 04/26/2015 04/09/2015 03/14/2015 02/18/2015 01/30/2015 99991111  Systolic BP 123456 [id number] 123456 Q000111Q - 0000000 123456  Diastolic BP 74 68 70 94 - 72 78  Wt. (Lbs) 207.12 207 214 206 212 206.04 210  BMI 34.47 34.45 35.61 34.28 36.37 34.29 34.95        DM (diabetes mellitus), type 2 with renal complications Controlled, no change in management Tammy Suarez is reminded of the importance of commitment to daily physical activity for 30 minutes or more, as able and the need to limit carbohydrate intake to 30 to 60 grams per meal to help with blood sugar control.   Tammy Suarez is reminded  of the importance of daily foot exam, annual eye examination, and good blood sugar, blood pressure and cholesterol control.  Diabetic Labs Latest Ref Rng 09/11/2015 05/10/2015 04/04/2015 01/30/2015 11/19/2014  HbA1c <5.7 % 7.2(H) - 6.9(H) - 6.8(H)  Microalbumin <2.0 mg/dL - - - - -  Micro/Creat Ratio 0.0 - 30.0 mg/g - - - - -  Chol 125 - 200 mg/dL 159 - 137 - 149  HDL >=46 mg/dL 38(L) - 32(L) - 39(L)  Calc LDL <130 mg/dL 85 - 62 - 80  Triglycerides  <150 mg/dL 178(H) - 215(H) - 150(H)  Creatinine 0.50 - 0.99 mg/dL 1.39(H) 1.62(H) 1.49(H) 1.68(H) 1.57(H)   BP/Weight 10/02/2015 04/26/2015 04/09/2015 03/14/2015 02/18/2015 01/30/2015 [id number]  Systolic BP 123456 [id number] 123456 Q000111Q - 0000000 123456  Diastolic BP 74 68 70 94 - 72 78  Wt. (Lbs) 207.12 207 214 206 212 206.04 210  BMI 34.47 34.45 35.61 34.28 36.37 34.29 34.95   Foot/eye exam completion dates Latest Ref Rng 10/02/2015 09/03/2014  Eye Exam No Retinopathy - -  Foot exam Order - - -  Foot Form Completion - Done Done    Slight inc , however has received 2 epidurals in past 8 to 10 weeks     Hypothyroidism Controlled, no change in medication   Depression with anxiety Ongoing stress, grand daughter who is 57 is addicted to drugs. Tammy Suarez not suicidal or homicidal, dealing with the situation as best able and trying to get help for her grandchild, no indication for therapy or med change at this time  Morbid obesity Unchanged Patient re-educated about  the importance of commitment to a  minimum of 150 minutes of exercise per week.  The importance of healthy food choices with portion control discussed. Encouraged to start a food diary, count calories and to consider  joining a support group. Sample diet sheets offered. Goals set by the patient for the next several months.   Weight /BMI 10/02/2015 04/26/2015 04/09/2015  WEIGHT 207 lb 1.9 oz 207 lb 214 lb  HEIGHT 5\' 5"  5\' 5"  5\' 5"   BMI 34.47 kg/m2 34.45 kg/m2 35.61 kg/m2    Current exercise per week 30 minutes.   Nicotine dependence unchnaged , will attempt to cut back by one cigarette every 2 weeks Patient counseled for approximately 5 minutes regarding the health risks of ongoing nicotine use, specifically all types of cancer, heart disease, stroke and respiratory failure. The options available for help with cessation ,the behavioral changes to assist the process, and the option to either gradully reduce usage  Or abruptly stop.is also  discussed. Tammy Suarez is also encouraged to set specific goals in number of cigarettes used daily, as well as to set a quit date.  Number of cigarettes/cigars currently smoking daily:10   Anxiety state Controlled, no change in medication   Recurrent falls Home safety reviewed, needs to use asistive device at all times, multiple falls , and has significant lower ext weakness ans numbness  Urinary incontinence Worse off of medication , vesicare re sent  Benign mole Tender right posterior neck mole, topical antibiotic twice daily for 4 days Tammy Suarez advised against traumatizing area       Review of Systems     Objective:   Physical Exam        Assessment & Plan:

## 2015-10-02 NOTE — Assessment & Plan Note (Signed)
Tender right posterior neck mole, topical antibiotic twice daily for 4 days Pt advised against traumatizing area

## 2015-10-02 NOTE — Patient Instructions (Addendum)
F/u with rectal in 4 month, call if you need me sooner  Flu vaccine and urine test and foot exam today  Increase dose of fenofibrate, and start gabapentin one at bedtime, vesicare is sent back in, and antibiotic oint to sore area on right side of neck for 5 days You are referred for eye exam   Work on reducing ciggs evwery 2 weeks , start with 9 per day today  Please work on good  health habits so that your health will improve. 1. Commitment to daily physical activity for 30 to 60  minutes, if you are able to do this.  2. Commitment to wise food choices. Aim for half of your  food intake to be vegetable and fruit, one quarter starchy foods, and one quarter protein. Try to eat on a regular schedule  3 meals per day, snacking between meals should be limited to vegetables or fruits or small portions of nuts. 64 ounces of water per day is generally recommended, unless you have specific health conditions, like heart failure or kidney failure where you will need to limit fluid intake.  3. Commitment to sufficient and a  good quality of physical and mental rest daily, generally between 6 to 8 hours per day.  WITH PERSISTANCE AND PERSEVERANCE, THE IMPOSSIBLE , BECOMES THE NORM! Thanks for choosing [facility], we consider it a privelige to serve you.   Fasting lipid, cmp and eGFr, HBA1C , TSH

## 2015-10-03 DIAGNOSIS — E113292 Type 2 diabetes mellitus with mild nonproliferative diabetic retinopathy without macular edema, left eye: Secondary | ICD-10-CM | POA: Diagnosis not present

## 2015-10-03 LAB — MICROALBUMIN / CREATININE URINE RATIO
CREATININE, URINE: 111 mg/dL (ref 20–320)
Microalb Creat Ratio: 22 mcg/mg creat (ref <30)
Microalb, Ur: 2.4 mg/dL

## 2015-10-14 ENCOUNTER — Other Ambulatory Visit: Payer: Self-pay

## 2015-10-14 MED ORDER — HYDROCODONE-ACETAMINOPHEN 10-325 MG PO TABS: 1.0000 | ORAL_TABLET | Freq: Three times a day (TID) | ORAL | Status: DC

## 2015-10-15 ENCOUNTER — Other Ambulatory Visit: Payer: Self-pay | Admitting: Family Medicine

## 2015-10-25 ENCOUNTER — Ambulatory Visit (INDEPENDENT_AMBULATORY_CARE_PROVIDER_SITE_OTHER): Payer: Medicare Other | Admitting: Cardiology

## 2015-10-25 ENCOUNTER — Encounter: Payer: Self-pay | Admitting: Cardiology

## 2015-10-25 VITALS — BP 128/76 | HR 60 | Ht 65.0 in | Wt 197.0 lb

## 2015-10-25 DIAGNOSIS — I2510 Atherosclerotic heart disease of native coronary artery without angina pectoris: Secondary | ICD-10-CM | POA: Diagnosis not present

## 2015-10-25 DIAGNOSIS — E785 Hyperlipidemia, unspecified: Secondary | ICD-10-CM

## 2015-10-25 DIAGNOSIS — I10 Essential (primary) hypertension: Secondary | ICD-10-CM

## 2015-10-25 NOTE — Progress Notes (Signed)
Cardiology Office Note  Date: 10/25/2015   ID: Tammy Suarez, DOB 05/28/1946, MRN [id number]  PCP: Tula Nakayama, MD  Primary Cardiologist: Rozann Lesches, MD   Chief Complaint  Patient presents with  . Coronary Artery Disease   History of Present Illness: Tammy Suarez is a 69 y.o. female last seen in June. She presents for a routine follow-up visit. Since last encounter she has not reported any significant angina symptoms, no nitroglycerin use. She has NYHA class 2 shortness of breath at baseline. Main functional limitation is related to lower back and hip pain. She follows with orthopedics in [place], has had some recent pain management injections, holding her own at this time. She states that she may ultimately need surgery.  She continues to follow with Dr. Moshe Cipro, was just seen in November. I reviewed her most recent lab work, LDL was 85.  We reviewed her medications. Cardiac regimen includes aspirin, TriCor, Lasix, Toprol-XL, and Zocor. She has nitroglycerin glycerin available.  Past Medical History  Diagnosis Date  . Vitamin B 12 deficiency   . Chronic back pain   . GERD (gastroesophageal reflux disease)   . Hyperlipidemia   . Obesity   . Essential hypertension, benign   . Type 2 diabetes mellitus without complications ([place])   . History of conjunctivitis   . Coronary atherosclerosis of native coronary artery     Multivessel status post CABG - LIMA to LAD, SVG to OM and SVG to PDA  . CKD (chronic kidney disease) stage 3, GFR 30-59 ml/min     Current Outpatient Prescriptions  Medication Sig Dispense Refill  . amitriptyline (ELAVIL) 25 MG tablet TAKE 1 TABLET BY MOUTH AT BEDTIME 30 tablet 0  . aspirin EC 81 MG tablet Take 81 mg by mouth daily.    . Aspirin-Acetaminophen-Caffeine (GOODY HEADACHE PO) Take 1 Package by mouth 2 (two) times daily as needed (headache).    Marland Kitchen buPROPion (WELLBUTRIN XL) 300 MG 24 hr tablet TAKE 1 TABLET BY MOUTH EVERY MORNING 90  tablet 1  . Choline Fenofibrate (FENOFIBRIC ACID) 45 MG CPDR TAKE ONE CAPSULE BY MOUTH EVERY DAY *10/7* 90 capsule 1  . clonazePAM (KLONOPIN) 1 MG tablet TAKE 1 TABLET BY MOUTH 3 TIMES A DAY 90 tablet 2  . DULoxetine (CYMBALTA) 60 MG capsule TAKE ONE CAPSULE BY MOUTH TWICE A DAY 180 capsule 1  . fenofibrate (TRICOR) 145 MG tablet Take 1 tablet (145 mg total) by mouth daily. 30 tablet 5  . furosemide (LASIX) 20 MG tablet Take daily as needed for leg swelling 30 tablet 6  . gabapentin (NEURONTIN) 300 MG capsule Take 1 capsule (300 mg total) by mouth at bedtime. 30 capsule 4  . HYDROcodone-acetaminophen (NORCO) 10-325 MG tablet Take 1 tablet by mouth 3 (three) times daily. 90 tablet 0  . levothyroxine (SYNTHROID, LEVOTHROID) 75 MCG tablet TAKE 1 TABLET BY MOUTH EVERY DAY *DOSE CHANGE* 90 tablet 1  . loratadine (CLARITIN) 10 MG tablet Take 1 tablet (10 mg total) by mouth daily as needed for allergies. 30 tablet 6  . metoprolol succinate (TOPROL-XL) 25 MG 24 hr tablet Take 1 tablet (25 mg total) by mouth daily. 90 tablet 0  . Multiple Vitamin (MULTIVITAMIN) tablet Take 1 tablet by mouth daily.    . nitroGLYCERIN (NITROSTAT) 0.4 MG SL tablet Place 1 tablet (0.4 mg total) under the tongue every 5 (five) minutes as needed for chest pain. 25 tablet 3  . Omega-3 Fatty Acids (FISH OIL PO) Take  1 capsule by mouth daily.    . ranitidine (ZANTAC) 150 MG tablet Take 1 tablet (150 mg total) by mouth 2 (two) times daily. 180 tablet 0  . simvastatin (ZOCOR) 40 MG tablet Take 1 tablet (40 mg total) by mouth at bedtime. 90 tablet 0  . solifenacin (VESICARE) 10 MG tablet Take 1 tablet (10 mg total) by mouth daily. 90 tablet 1   No current facility-administered medications for this visit.   Allergies:  Meperidine hcl; Niacin; and Propoxyphene n-acetaminophen   Social History: The patient  reports that she has been smoking Cigarettes.  She has a 37.5 pack-year smoking history. She quit smokeless tobacco use about a  year ago. She reports that she does not drink alcohol or use illicit drugs.   ROS:  Please see the history of present illness. Otherwise, complete review of systems is positive for chronic back pain.  All other systems are reviewed and negative.   Physical Exam: VS:  BP 128/76 mmHg  Pulse 60  Ht 5\' 5"  (1.651 m)  Wt 197 lb (89.359 kg)  BMI 32.78 kg/m2  SpO2 96%, BMI Body mass index is 32.78 kg/(m^2).  Wt Readings from Last 3 Encounters:  10/25/15 197 lb (89.359 kg)  10/02/15 207 lb 1.9 oz (93.949 kg)  04/26/15 207 lb (93.895 kg)    Overweight woman, appears comfortable at rest.  HEENT: Conjunctiva and lids normal, oropharynx clear.  Neck: Supple, no elevated JVP or carotid bruits, no thyromegaly.  Lungs: Clear to auscultation, nonlabored breathing at rest.  Cardiac: Regular rate and rhythm, no S3 or significant systolic murmur, no pericardial rub.  Abdomen: Soft, nontender, bowel sounds present, no guarding or rebound.  Extremities: No pitting edema, distal pulses 2+.   ECG: ECG is not ordered today.  Recent Labwork: 09/11/2015: ALT 9; AST 14; BUN 23; Creat 1.39*; Hemoglobin 12.2; Platelets 286; Potassium 4.5; Sodium 136; TSH 2.885     Component Value Date/Time   CHOL 159 09/11/2015 1226   TRIG 178* 09/11/2015 1226   HDL 38* 09/11/2015 1226   CHOLHDL 4.2 09/11/2015 1226   VLDL 36* 09/11/2015 1226   [place] 85 09/11/2015 1226    Other Studies Reviewed Today:  Echocardiogram from December 2014 showed LVEF 55-60% with no regional wall motion abnormalities, diastolic dysfunction, no major valvular abnormalities.  Assessment and Plan:  1. Multivessel CAD status post CABG in December 2014. She is symptomatically stable without angina. Plan to continue medical therapy and observation.  2. Hyperlipidemia, on Zocor and TriCor. Recent LDL 85, triglycerides 178.  3. Essential hypertension, blood pressure control is adequate today.  Current medicines were reviewed with the  patient today.   Orders Placed This Encounter  Procedures  . EKG 12-Lead    Disposition: FU with me in 6 months.   Signed, Satira Sark, MD, [facility] 10/25/2015 11:05 AM    [facility] [place]. [address], [place], [place] [address] Phone: [phone]; Fax: [phone]

## 2015-10-25 NOTE — Patient Instructions (Signed)
Your physician wants you to follow-up in: 6 months with Dr McDowell You will receive a reminder letter in the mail two months in advance. If you don't receive a letter, please call our office to schedule the follow-up appointment.     Your physician recommends that you continue on your current medications as directed. Please refer to the Current Medication list given to you today.    If you need a refill on your cardiac medications before your next appointment, please call your pharmacy.     Thank you for choosing [facility] !        

## 2015-11-01 ENCOUNTER — Other Ambulatory Visit: Payer: Self-pay | Admitting: Family Medicine

## 2015-11-12 ENCOUNTER — Other Ambulatory Visit: Payer: Self-pay

## 2015-11-12 MED ORDER — HYDROCODONE-ACETAMINOPHEN 10-325 MG PO TABS
1.0000 | ORAL_TABLET | Freq: Three times a day (TID) | ORAL | Status: DC
Start: 2015-11-12 — End: 2015-12-13

## 2015-11-17 ENCOUNTER — Other Ambulatory Visit: Payer: Self-pay | Admitting: Family Medicine

## 2015-11-29 ENCOUNTER — Other Ambulatory Visit: Payer: Self-pay | Admitting: Family Medicine

## 2015-12-06 ENCOUNTER — Other Ambulatory Visit: Payer: Self-pay | Admitting: Family Medicine

## 2015-12-13 ENCOUNTER — Other Ambulatory Visit: Payer: Self-pay

## 2015-12-13 MED ORDER — HYDROCODONE-ACETAMINOPHEN 10-325 MG PO TABS: 1.0000 | ORAL_TABLET | Freq: Three times a day (TID) | ORAL | Status: DC

## 2015-12-21 ENCOUNTER — Other Ambulatory Visit: Payer: Self-pay | Admitting: Family Medicine

## 2015-12-26 ENCOUNTER — Telehealth: Payer: Self-pay | Admitting: Family Medicine

## 2015-12-26 ENCOUNTER — Other Ambulatory Visit: Payer: Self-pay | Admitting: Family Medicine

## 2015-12-26 NOTE — Telephone Encounter (Signed)
Chellie has gotten confused about some of her medications and needs to speak to someone, please advise?

## 2015-12-26 NOTE — Telephone Encounter (Signed)
Called pt no answer °

## 2015-12-27 NOTE — Telephone Encounter (Signed)
Patient aware to take the fenofibrate 145

## 2015-12-30 ENCOUNTER — Other Ambulatory Visit: Payer: Self-pay | Admitting: Family Medicine

## 2016-01-07 DIAGNOSIS — Z1159 Encounter for screening for other viral diseases: Secondary | ICD-10-CM | POA: Diagnosis not present

## 2016-01-07 DIAGNOSIS — I10 Essential (primary) hypertension: Secondary | ICD-10-CM | POA: Diagnosis not present

## 2016-01-07 DIAGNOSIS — E785 Hyperlipidemia, unspecified: Secondary | ICD-10-CM | POA: Diagnosis not present

## 2016-01-07 DIAGNOSIS — E1129 Type 2 diabetes mellitus with other diabetic kidney complication: Secondary | ICD-10-CM | POA: Diagnosis not present

## 2016-01-08 LAB — COMPLETE METABOLIC PANEL WITH GFR
ALBUMIN: 3.9 g/dL (ref 3.6–5.1)
ALK PHOS: 46 U/L (ref 33–130)
ALT: 9 U/L (ref 6–29)
AST: 17 U/L (ref 10–35)
BUN: 29 mg/dL — AB (ref 7–25)
CALCIUM: 9.5 mg/dL (ref 8.6–10.4)
CO2: 28 mmol/L (ref 20–31)
CREATININE: 1.39 mg/dL — AB (ref 0.60–0.93)
Chloride: 96 mmol/L — ABNORMAL LOW (ref 98–110)
GFR, Est African American: 44 mL/min — ABNORMAL LOW (ref >=60)
GFR, Est Non African American: 38 mL/min — ABNORMAL LOW (ref >=60)
GLUCOSE: 134 mg/dL — AB (ref 65–99)
Potassium: 4.6 mmol/L (ref 3.5–5.3)
Sodium: 138 mmol/L (ref 135–146)
Total Bilirubin: 0.3 mg/dL (ref 0.2–1.2)
Total Protein: 6.9 g/dL (ref 6.1–8.1)

## 2016-01-08 LAB — LIPID PANEL
CHOL/HDL RATIO: 5.4 ratio — AB (ref <=5.0)
CHOLESTEROL: 173 mg/dL (ref 125–200)
HDL: 32 mg/dL — ABNORMAL LOW (ref >=46)
LDL Cholesterol: 79 mg/dL (ref <130)
TRIGLYCERIDES: 310 mg/dL — AB (ref <150)
VLDL: 62 mg/dL — AB (ref <30)

## 2016-01-08 LAB — HEMOGLOBIN A1C
HEMOGLOBIN A1C: 7.4 % — AB (ref <5.7)
Mean Plasma Glucose: 166 mg/dL — ABNORMAL HIGH (ref <117)

## 2016-01-08 LAB — HEPATITIS C ANTIBODY: HCV Ab: NEGATIVE

## 2016-01-08 LAB — TSH: TSH: 7.32 mIU/L — ABNORMAL HIGH

## 2016-01-09 ENCOUNTER — Telehealth: Payer: Self-pay | Admitting: Family Medicine

## 2016-01-09 NOTE — Telephone Encounter (Signed)
Medication refilled 2/6 to [facility]

## 2016-01-09 NOTE — Telephone Encounter (Signed)
Patient is asking for a refill on levothyroxine (SYNTHROID, LEVOTHROID) 75 MCG tablet

## 2016-01-10 ENCOUNTER — Other Ambulatory Visit: Payer: Self-pay

## 2016-01-10 MED ORDER — HYDROCODONE-ACETAMINOPHEN 10-325 MG PO TABS: 1.0000 | ORAL_TABLET | Freq: Three times a day (TID) | ORAL | Status: DC

## 2016-01-16 ENCOUNTER — Other Ambulatory Visit: Payer: Self-pay

## 2016-01-16 MED ORDER — LEVOTHYROXINE SODIUM 75 MCG PO TABS: ORAL_TABLET | ORAL | Status: DC

## 2016-01-16 MED ORDER — LEVOTHYROXINE SODIUM 100 MCG PO TABS: ORAL_TABLET | ORAL | Status: DC

## 2016-01-22 DIAGNOSIS — M4806 Spinal stenosis, lumbar region: Secondary | ICD-10-CM | POA: Diagnosis not present

## 2016-01-22 DIAGNOSIS — I10 Essential (primary) hypertension: Secondary | ICD-10-CM | POA: Diagnosis not present

## 2016-01-22 DIAGNOSIS — Z6836 Body mass index (BMI) 36.0-36.9, adult: Secondary | ICD-10-CM | POA: Diagnosis not present

## 2016-01-30 ENCOUNTER — Ambulatory Visit: Payer: Self-pay | Admitting: Family Medicine

## 2016-02-04 ENCOUNTER — Other Ambulatory Visit: Payer: Self-pay | Admitting: Acute Care

## 2016-02-04 DIAGNOSIS — F17210 Nicotine dependence, cigarettes, uncomplicated: Secondary | ICD-10-CM

## 2016-02-05 ENCOUNTER — Encounter: Payer: Self-pay | Admitting: Family Medicine

## 2016-02-05 ENCOUNTER — Ambulatory Visit (INDEPENDENT_AMBULATORY_CARE_PROVIDER_SITE_OTHER): Payer: Medicare Other | Admitting: Family Medicine

## 2016-02-05 VITALS — BP 138/80 | HR 75 | Resp 16 | Ht 65.0 in | Wt 210.0 lb

## 2016-02-05 DIAGNOSIS — N39498 Other specified urinary incontinence: Secondary | ICD-10-CM

## 2016-02-05 DIAGNOSIS — E559 Vitamin D deficiency, unspecified: Secondary | ICD-10-CM

## 2016-02-05 DIAGNOSIS — Z1211 Encounter for screening for malignant neoplasm of colon: Secondary | ICD-10-CM | POA: Diagnosis not present

## 2016-02-05 DIAGNOSIS — F17208 Nicotine dependence, unspecified, with other nicotine-induced disorders: Secondary | ICD-10-CM

## 2016-02-05 DIAGNOSIS — E1129 Type 2 diabetes mellitus with other diabetic kidney complication: Secondary | ICD-10-CM

## 2016-02-05 DIAGNOSIS — M549 Dorsalgia, unspecified: Secondary | ICD-10-CM

## 2016-02-05 DIAGNOSIS — E038 Other specified hypothyroidism: Secondary | ICD-10-CM

## 2016-02-05 DIAGNOSIS — M79642 Pain in left hand: Secondary | ICD-10-CM | POA: Insufficient documentation

## 2016-02-05 DIAGNOSIS — I10 Essential (primary) hypertension: Secondary | ICD-10-CM

## 2016-02-05 DIAGNOSIS — E785 Hyperlipidemia, unspecified: Secondary | ICD-10-CM

## 2016-02-05 LAB — POC HEMOCCULT BLD/STL (OFFICE/1-CARD/DIAGNOSTIC): Fecal Occult Blood, POC: NEGATIVE

## 2016-02-05 LAB — HM DIABETES EYE EXAM

## 2016-02-05 MED ORDER — GABAPENTIN 300 MG PO CAPS: 300.0000 mg | ORAL_CAPSULE | Freq: Every day | ORAL | Status: DC

## 2016-02-05 MED ORDER — OXYBUTYNIN CHLORIDE ER 10 MG PO TB24: 10.0000 mg | ORAL_TABLET | Freq: Every day | ORAL | Status: DC

## 2016-02-05 NOTE — Progress Notes (Signed)
Subjective:    Patient ID: Tammy Suarez, female    DOB: 1946/06/04, 70 y.o.   MRN: [id number]  HPI   Tammy Suarez     MRN: [id number]      DOB: 02-Feb-1946   HPI Tammy Suarez is here for follow up and re-evaluation of chronic medical conditions, medication management and review of any available recent lab and radiology data.  Preventive health is updated, specifically  Cancer screening and Immunization.   Questions or concerns regarding consultations or procedures which the PT has had in the interim are  addressed. The PT denies any adverse reactions to current medications since the last visit.  C/o increased left hand pain , and weakness, and tingling, also uncontrolled back and lower extremity pain C/o uncontrolled incontinence ROS Denies recent fever or chills. Denies sinus pressure, nasal congestion, ear pain or sore throat. Denies chest congestion, productive cough or wheezing. Denies chest pains, palpitations and leg swelling Denies abdominal pain, nausea, vomiting,diarrhea or constipation.   Denies dysuria, frequency, hesitancy uncontrolled  Incontinence despite medication Increased and uncontrolled  joint pain, swelling and limitation in mobility. Denies headaches, seizures, numbness, or tingling. Denies depression, anxiety or insomnia. Denies skin break down or rash.   PE  BP 138/80 mmHg  Pulse 75  Resp 16  Ht 5\' 5"  (1.651 m)  Wt 210 lb (95.255 kg)  BMI 34.95 kg/m2  SpO2 96%  Patient alert and oriented and in no cardiopulmonary distress.  HEENT: No facial asymmetry, EOMI,   oropharynx pink and moist.  Neck supple no JVD, no mass.  Chest: Clear to auscultation bilaterally.Decreased air entry bilaterally  CVS: S1, S2 no murmurs, no S3.Regular rate.  ABD: Soft non tender. Normal BS, no organomegaly or mass Rectal: no mass, heme negative stool  Ext: No edema  MS: Adequate ROM spine, shoulders, hips and knees.  Skin: Intact, no ulcerations or rash  noted.  Psych: Good eye contact, normal affect. Memory intact not anxious or depressed appearing.  CNS: CN 2-12 intact, power,  normal throughout.no focal deficits noted.   Assessment & Plan   Hand pain, left 6 month h/o worsening left hand pain , numbness and weakness, needs nerve conduction teast, possibl;e carpal tunnel synd  Nicotine dependence Patient counseled for approximately 5 minutes regarding the health risks of ongoing nicotine use, specifically all types of cancer, heart disease, stroke and respiratory failure. The options available for help with cessation ,the behavioral changes to assist the process, and the option to either gradully reduce usage  Or abruptly stop.is also discussed. Pt is also encouraged to set specific goals in number of cigarettes used daily, as well as to set a quit date.    Urinary incontinence Inadequate response to vesicare, still very symptomatic, trial of oxybutynin, also commitment to Kegel exercises daily   Back pain with radiation Inadequate control add gabapentin at bedtime  Essential hypertension, benign Controlled, no change in medication DASH diet and commitment to daily physical activity for a minimum of 30 minutes discussed and encouraged, as a part of hypertension management. The importance of attaining a healthy weight is also discussed.  BP/Weight 02/05/2016 10/25/2015 10/02/2015 04/26/2015 04/09/2015 0000000 Q000111Q  Systolic BP 0000000 0000000 123456 [id number] 123456 Q000111Q -  Diastolic BP 80 76 74 68 70 94 -  Wt. (Lbs) 210 197 207.12 207 214 206 212  BMI 34.95 32.78 34.47 34.45 35.61 34.28 36.37        DM (diabetes mellitus), type 2 with renal complications  Deteriorated, no change in management Tammy Suarez is reminded of the importance of commitment to daily physical activity for 30 minutes or more, as able and the need to limit carbohydrate intake to 30 to 60 grams per meal to help with blood sugar control.   The need to take medication as  prescribed, test blood sugar as directed, and to call between visits if there is a concern that blood sugar is uncontrolled is also discussed.   Tammy Suarez is reminded of the importance of daily foot exam, annual eye examination, and good blood sugar, blood pressure and cholesterol control.  Diabetic Labs Latest Ref Rng 01/07/2016 10/02/2015 09/11/2015 05/10/2015 04/04/2015  HbA1c <5.7 % 7.4(H) - 7.2(H) - 6.9(H)  Microalbumin Not estab mg/dL - 2.4 - - -  Micro/Creat Ratio <30 mcg/mg creat - 22 - - -  Chol 125 - 200 mg/dL 173 - 159 - 137  HDL >=46 mg/dL 32(L) - 38(L) - 32(L)  Calc LDL <130 mg/dL 79 - 85 - 62  Triglycerides <150 mg/dL 310(H) - 178(H) - 215(H)  Creatinine 0.60 - 0.93 mg/dL 1.39(H) - 1.39(H) 1.62(H) 1.49(H)   BP/Weight 02/05/2016 10/25/2015 10/02/2015 04/26/2015 04/09/2015 0000000 Q000111Q  Systolic BP 0000000 0000000 123456 [id number] 123456 Q000111Q -  Diastolic BP 80 76 74 68 70 94 -  Wt. (Lbs) 210 197 207.12 207 214 206 212  BMI 34.95 32.78 34.47 34.45 35.61 34.28 36.37   Foot/eye exam completion dates Latest Ref Rng 10/02/2015 09/03/2014  Eye Exam No Retinopathy - -  Foot exam Order - - -  Foot Form Completion - Done Done       n   Hypothyroidism Controlled, no change in medication   Hyperlipidemia with target LDL less than 70 Hyperlipidemia:Low fat diet discussed and encouraged.   Lipid Panel  Lab Results  Component Value Date   CHOL 173 01/07/2016   HDL 32* 01/07/2016   LDLCALC 79 01/07/2016   TRIG 310* 01/07/2016   CHOLHDL 5.4* 01/07/2016   Elevated TG, add fenofibrate     Morbid obesity Deteriorated. Patient re-educated about  the importance of commitment to a  minimum of 150 minutes of exercise per week.  The importance of healthy food choices with portion control discussed. Encouraged to start a food diary, count calories and to consider  joining a support group. Sample diet sheets offered. Goals set by the patient for the next several months.   Weight /BMI  02/05/2016 10/25/2015 10/02/2015  WEIGHT 210 lb 197 lb 207 lb 1.9 oz  HEIGHT 5\' 5"  5\' 5"  5\' 5"   BMI 34.95 kg/m2 32.78 kg/m2 34.47 kg/m2    Current exercise per week 30 minutes.        Review of Systems     Objective:   Physical Exam        Assessment & Plan:

## 2016-02-05 NOTE — Assessment & Plan Note (Signed)
6 month h/o worsening left hand pain , numbness and weakness, needs nerve conduction teast, possibl;e carpal tunnel synd

## 2016-02-05 NOTE — Patient Instructions (Addendum)
Annual wellness in 4 months, call if you need me sooner  Reduce fatty foods and ice cream to improve cholesterol  Please set a QUIT date for smoking and work on quitting, you are almost there, and that will help you tremendously!  Fasting lipdid, cmp and EGFR, HBA1C, TSH and vit D level for next visit  NEW medication gabapentin one at bedtime to help with pain  In place of vesicare ditropan once daily started for incontinence  Rectal exam today is good  Take fenofibrate 145 mg one daily  You are referred to Dr Merlene Laughter to be tested for carpal tunnel in left hand, his office will call you with an appointment  Thanks for choosing [facility], we consider it a privelige to serve you.

## 2016-02-06 MED ORDER — RANITIDINE HCL 150 MG PO TABS: 150.0000 mg | ORAL_TABLET | Freq: Two times a day (BID) | ORAL | Status: DC

## 2016-02-06 MED ORDER — DULOXETINE HCL 60 MG PO CPEP: 60.0000 mg | ORAL_CAPSULE | Freq: Two times a day (BID) | ORAL | Status: DC

## 2016-02-06 MED ORDER — SIMVASTATIN 40 MG PO TABS: ORAL_TABLET | ORAL | Status: DC

## 2016-02-07 ENCOUNTER — Other Ambulatory Visit: Payer: Self-pay

## 2016-02-07 MED ORDER — HYDROCODONE-ACETAMINOPHEN 10-325 MG PO TABS: 1.0000 | ORAL_TABLET | Freq: Three times a day (TID) | ORAL | Status: DC

## 2016-02-09 NOTE — Assessment & Plan Note (Signed)
Controlled, no change in medication  

## 2016-02-09 NOTE — Assessment & Plan Note (Signed)
Inadequate control add gabapentin at bedtime

## 2016-02-09 NOTE — Assessment & Plan Note (Signed)

## 2016-02-09 NOTE — Assessment & Plan Note (Signed)
Deteriorated, no change in management Tammy Suarez is reminded of the importance of commitment to daily physical activity for 30 minutes or more, as able and the need to limit carbohydrate intake to 30 to 60 grams per meal to help with blood sugar control.   The need to take medication as prescribed, test blood sugar as directed, and to call between visits if there is a concern that blood sugar is uncontrolled is also discussed.   Tammy Suarez is reminded of the importance of daily foot exam, annual eye examination, and good blood sugar, blood pressure and cholesterol control.  Diabetic Labs Latest Ref Rng 01/07/2016 10/02/2015 09/11/2015 05/10/2015 04/04/2015  HbA1c <5.7 % 7.4(H) - 7.2(H) - 6.9(H)  Microalbumin Not estab mg/dL - 2.4 - - -  Micro/Creat Ratio <30 mcg/mg creat - 22 - - -  Chol 125 - 200 mg/dL 173 - 159 - 137  HDL >=46 mg/dL 32(L) - 38(L) - 32(L)  Calc LDL <130 mg/dL 79 - 85 - 62  Triglycerides <150 mg/dL 310(H) - 178(H) - 215(H)  Creatinine 0.60 - 0.93 mg/dL 1.39(H) - 1.39(H) 1.62(H) 1.49(H)   BP/Weight 02/05/2016 10/25/2015 10/02/2015 04/26/2015 04/09/2015 0000000 Q000111Q  Systolic BP 0000000 0000000 123456 [id number] 123456 Q000111Q -  Diastolic BP 80 76 74 68 70 94 -  Wt. (Lbs) 210 197 207.12 207 214 206 212  BMI 34.95 32.78 34.47 34.45 35.61 34.28 36.37   Foot/eye exam completion dates Latest Ref Rng 10/02/2015 09/03/2014  Eye Exam No Retinopathy - -  Foot exam Order - - -  Foot Form Completion - Done Done       n

## 2016-02-09 NOTE — Assessment & Plan Note (Signed)
Hyperlipidemia:Low fat diet discussed and encouraged.   Lipid Panel  Lab Results  Component Value Date   CHOL 173 01/07/2016   HDL 32* 01/07/2016   LDLCALC 79 01/07/2016   TRIG 310* 01/07/2016   CHOLHDL 5.4* 01/07/2016   Elevated TG, add fenofibrate

## 2016-02-09 NOTE — Assessment & Plan Note (Signed)
Deteriorated. Patient re-educated about  the importance of commitment to a  minimum of 150 minutes of exercise per week.  The importance of healthy food choices with portion control discussed. Encouraged to start a food diary, count calories and to consider  joining a support group. Sample diet sheets offered. Goals set by the patient for the next several months.   Weight /BMI 02/05/2016 10/25/2015 10/02/2015  WEIGHT 210 lb 197 lb 207 lb 1.9 oz  HEIGHT 5\' 5"  5\' 5"  5\' 5"   BMI 34.95 kg/m2 32.78 kg/m2 34.47 kg/m2    Current exercise per week 30 minutes.

## 2016-02-09 NOTE — Assessment & Plan Note (Signed)
Controlled, no change in medication DASH diet and commitment to daily physical activity for a minimum of 30 minutes discussed and encouraged, as a part of hypertension management. The importance of attaining a healthy weight is also discussed.  BP/Weight 02/05/2016 10/25/2015 10/02/2015 04/26/2015 04/09/2015 0000000 Q000111Q  Systolic BP 0000000 0000000 123456 [id number] 123456 Q000111Q -  Diastolic BP 80 76 74 68 70 94 -  Wt. (Lbs) 210 197 207.12 207 214 206 212  BMI 34.95 32.78 34.47 34.45 35.61 34.28 36.37

## 2016-02-09 NOTE — Assessment & Plan Note (Signed)
Inadequate response to vesicare, still very symptomatic, trial of oxybutynin, also commitment to Kegel exercises daily

## 2016-02-11 DIAGNOSIS — M5416 Radiculopathy, lumbar region: Secondary | ICD-10-CM | POA: Diagnosis not present

## 2016-02-11 DIAGNOSIS — E039 Hypothyroidism, unspecified: Secondary | ICD-10-CM | POA: Diagnosis not present

## 2016-02-11 DIAGNOSIS — R209 Unspecified disturbances of skin sensation: Secondary | ICD-10-CM | POA: Diagnosis not present

## 2016-02-11 DIAGNOSIS — E119 Type 2 diabetes mellitus without complications: Secondary | ICD-10-CM | POA: Diagnosis not present

## 2016-02-11 DIAGNOSIS — M5412 Radiculopathy, cervical region: Secondary | ICD-10-CM | POA: Diagnosis not present

## 2016-02-11 DIAGNOSIS — I10 Essential (primary) hypertension: Secondary | ICD-10-CM | POA: Diagnosis not present

## 2016-03-03 ENCOUNTER — Other Ambulatory Visit: Payer: Self-pay

## 2016-03-03 MED ORDER — HYDROCODONE-ACETAMINOPHEN 10-325 MG PO TABS
1.0000 | ORAL_TABLET | Freq: Three times a day (TID) | ORAL | Status: DC
Start: 2016-03-03 — End: 2016-04-14

## 2016-03-05 ENCOUNTER — Other Ambulatory Visit: Payer: Self-pay | Admitting: Family Medicine

## 2016-03-06 ENCOUNTER — Other Ambulatory Visit: Payer: Self-pay | Admitting: Family Medicine

## 2016-03-19 ENCOUNTER — Other Ambulatory Visit: Payer: Self-pay | Admitting: Family Medicine

## 2016-03-21 ENCOUNTER — Other Ambulatory Visit: Payer: Self-pay | Admitting: Family Medicine

## 2016-03-24 ENCOUNTER — Telehealth: Payer: Self-pay | Admitting: Family Medicine

## 2016-03-24 ENCOUNTER — Other Ambulatory Visit: Payer: Self-pay | Admitting: Family Medicine

## 2016-03-24 NOTE — Telephone Encounter (Signed)
Patient is asking for a medication refill on clonazePAM (KLONOPIN) 1 MG tablet please advise?

## 2016-03-24 NOTE — Telephone Encounter (Signed)
Med was refilled yesterday

## 2016-03-29 ENCOUNTER — Other Ambulatory Visit: Payer: Self-pay | Admitting: Cardiology

## 2016-04-14 ENCOUNTER — Other Ambulatory Visit: Payer: Self-pay

## 2016-04-14 MED ORDER — HYDROCODONE-ACETAMINOPHEN 10-325 MG PO TABS: 1.0000 | ORAL_TABLET | Freq: Three times a day (TID) | ORAL | Status: DC

## 2016-04-29 DIAGNOSIS — G5603 Carpal tunnel syndrome, bilateral upper limbs: Secondary | ICD-10-CM | POA: Diagnosis not present

## 2016-04-29 DIAGNOSIS — G5601 Carpal tunnel syndrome, right upper limb: Secondary | ICD-10-CM | POA: Diagnosis not present

## 2016-04-29 DIAGNOSIS — G5602 Carpal tunnel syndrome, left upper limb: Secondary | ICD-10-CM | POA: Diagnosis not present

## 2016-04-29 DIAGNOSIS — E1040 Type 1 diabetes mellitus with diabetic neuropathy, unspecified: Secondary | ICD-10-CM | POA: Diagnosis not present

## 2016-05-05 ENCOUNTER — Other Ambulatory Visit: Payer: Self-pay | Admitting: Family Medicine

## 2016-05-13 ENCOUNTER — Other Ambulatory Visit: Payer: Self-pay

## 2016-05-13 MED ORDER — HYDROCODONE-ACETAMINOPHEN 10-325 MG PO TABS: 1.0000 | ORAL_TABLET | Freq: Three times a day (TID) | ORAL | Status: DC

## 2016-05-18 ENCOUNTER — Telehealth: Payer: Self-pay

## 2016-05-18 DIAGNOSIS — Z5181 Encounter for therapeutic drug level monitoring: Secondary | ICD-10-CM

## 2016-05-18 NOTE — Telephone Encounter (Signed)
Patient signed updated contract.  UDS collected.  Prescription given

## 2016-05-20 ENCOUNTER — Telehealth: Payer: Self-pay | Admitting: Family Medicine

## 2016-05-20 ENCOUNTER — Ambulatory Visit: Payer: Self-pay | Admitting: Cardiology

## 2016-05-20 ENCOUNTER — Other Ambulatory Visit (HOSPITAL_COMMUNITY)
Admission: RE | Admit: 2016-05-20 | Discharge: 2016-05-20 | Disposition: A | Discharge status: Home / Self Care | Payer: Medicare Other | Source: Ambulatory Visit | Attending: Family Medicine | Admitting: Family Medicine

## 2016-05-20 DIAGNOSIS — Z5181 Encounter for therapeutic drug level monitoring: Secondary | ICD-10-CM | POA: Diagnosis not present

## 2016-05-20 LAB — RAPID URINE DRUG SCREEN, HOSP PERFORMED
Amphetamines: NOT DETECTED
BENZODIAZEPINES: NOT DETECTED
Barbiturates: NOT DETECTED
Cocaine: NOT DETECTED
Opiates: NOT DETECTED
Tetrahydrocannabinol: NOT DETECTED

## 2016-05-20 NOTE — Telephone Encounter (Signed)
Needs appt nextr week ( Tuesday looks good)for  medication management, explain important she come in and schedule appt pls, let me know if probs

## 2016-05-21 ENCOUNTER — Ambulatory Visit (HOSPITAL_COMMUNITY): Payer: Medicare Other

## 2016-05-21 NOTE — Telephone Encounter (Signed)
Patient is coming in Tammy Suarez July 11 at 1:45

## 2016-05-26 ENCOUNTER — Encounter: Payer: Self-pay | Admitting: Family Medicine

## 2016-05-26 ENCOUNTER — Ambulatory Visit (INDEPENDENT_AMBULATORY_CARE_PROVIDER_SITE_OTHER): Payer: Medicare Other | Admitting: Family Medicine

## 2016-05-26 VITALS — BP 130/78 | HR 73 | Resp 16 | Ht 65.0 in | Wt 205.0 lb

## 2016-05-26 DIAGNOSIS — Z1211 Encounter for screening for malignant neoplasm of colon: Secondary | ICD-10-CM | POA: Diagnosis not present

## 2016-05-26 DIAGNOSIS — M4806 Spinal stenosis, lumbar region: Secondary | ICD-10-CM | POA: Diagnosis not present

## 2016-05-26 DIAGNOSIS — F418 Other specified anxiety disorders: Secondary | ICD-10-CM

## 2016-05-26 DIAGNOSIS — M48061 Spinal stenosis, lumbar region without neurogenic claudication: Secondary | ICD-10-CM

## 2016-05-26 DIAGNOSIS — I10 Essential (primary) hypertension: Secondary | ICD-10-CM

## 2016-05-26 DIAGNOSIS — E1121 Type 2 diabetes mellitus with diabetic nephropathy: Secondary | ICD-10-CM

## 2016-05-26 DIAGNOSIS — G894 Chronic pain syndrome: Secondary | ICD-10-CM

## 2016-05-26 DIAGNOSIS — M5416 Radiculopathy, lumbar region: Secondary | ICD-10-CM

## 2016-05-26 DIAGNOSIS — Z5181 Encounter for therapeutic drug level monitoring: Secondary | ICD-10-CM | POA: Diagnosis not present

## 2016-05-26 MED ORDER — FLUOXETINE HCL 20 MG PO TABS: 20.0000 mg | ORAL_TABLET | Freq: Every day | ORAL | Status: DC

## 2016-05-26 NOTE — Patient Instructions (Addendum)
F/u in August as before, call if you need me sooner  New additional medication for depression and anxiety, fluoxetine take one daily  It is VITAL that you take medication as prescribed for safety and so that it WILL work  You are being referred to a pain specialist for improved pain management, we will notify you of the appointment and ensure you get medication prescribed to last until that appointment  I hope that things improve for you  CONGRATS ON QUITTING  SMOKING, no cigarettes allowed in your home!

## 2016-05-27 ENCOUNTER — Encounter (INDEPENDENT_AMBULATORY_CARE_PROVIDER_SITE_OTHER): Payer: Self-pay | Admitting: *Deleted

## 2016-05-31 ENCOUNTER — Encounter: Payer: Self-pay | Admitting: Family Medicine

## 2016-05-31 DIAGNOSIS — Z0000 Encounter for general adult medical examination without abnormal findings: Secondary | ICD-10-CM | POA: Insufficient documentation

## 2016-05-31 DIAGNOSIS — Z5181 Encounter for therapeutic drug level monitoring: Secondary | ICD-10-CM | POA: Insufficient documentation

## 2016-05-31 HISTORY — DX: Encounter for general adult medical examination without abnormal findings: Z00.00

## 2016-05-31 NOTE — Assessment & Plan Note (Signed)
Not suicidal or homicidal, however her personal ;life continues to be a main cause of despair and depression, reports that her  only living child recently "had to  "move out" because she did not like / agree with her lifestyle.Her Grandchildren are also a source of stress Start medication, no interest in therapy , does not feel that she would benefit F/u in 6 to 7 weeks

## 2016-05-31 NOTE — Assessment & Plan Note (Addendum)
.  cpon1 DASH diet and commitment to daily physical activity for a minimum of 30 minutes discussed and encouraged, as a part of hypertension management. The importance of attaining a healthy weight is also discussed.  BP/Weight 05/26/2016 02/05/2016 10/25/2015 10/02/2015 04/26/2015 04/09/2015 0000000  Systolic BP [id number] 0000000 0000000 123456 [id number] 123456 Q000111Q  Diastolic BP 78 80 76 74 68 70 94  Wt. (Lbs) 205 210 197 207.12 207 214 206  BMI 34.11 34.95 32.78 34.47 34.45 35.61 34.28

## 2016-05-31 NOTE — Progress Notes (Signed)
CHLOEANN LAING     MRN: [id number]      DOB: 06/22/1946   HPI Ms. Bloom is here to review a recent abnormal urine drug screen , which showed neither her narcotic pain medication which she takes for established spinal stenosis and disc disease , s/p spine surgery x 2 , and also the klonopin which she is prescribed for chronic anxiety, and in her mind , also for depression. She continues to report a high level of personal stress compounded by the fact that her only living child , as well as her Grandchildren remain a constant source of stress. She also reports that her daughter was recently forced to move out of her home because of continued poor lifestyle choices. She is not suicidal or homicidal, but often feels overwhelmed with little energy to continue to move forward, does not feel that counseling will benefit her , but will take medication for depression and GAD. She suffers from chronic lower back and lower extremity pain  ROS Denies recent fever or chills. Denies sinus pressure, nasal congestion, ear pain or sore throat. Denies chest congestion, productive cough or wheezing. Denies chest pains, palpitations and leg swelling Denies abdominal pain, nausea, vomiting,diarrhea or constipation.   Denies dysuria, frequency, hesitancy or incontinence,   Denies skin break down or rash.   PE  BP 130/78 mmHg  Pulse 73  Resp 16  Ht 5\' 5"  (1.651 m)  Wt 205 lb (92.987 kg)  BMI 34.11 kg/m2  SpO2 96%  Patient alert and oriented and in no cardiopulmonary distress.  HEENT: No facial asymmetry, EOMI,   oropharynx pink and moist.  Neck supple no JVD, no mass.  Chest: Clear to auscultation bilaterally.decreased though adequate air entry  CVS: S1, S2 no murmurs, no S3.Regular rate.  ABD: Soft non tender.   Ext: No edema  MS: decreased ROM spine, , hips and knees.  Skin: Intact, no ulcerations or rash noted.  Psych: Good eye contact, . Memory intact mildly  anxious and depressed  appearing.  CNS: CN 2-12 intact, power,  normal throughout.no focal deficits noted.   Assessment & Plan  Spinal stenosis of lumbar region with radiculopathy Established severe disease including disc protrusion and nerve compression s/p back surgery, cervical and lumbar Failed recent UDS unfortunately and will need pain management for ongoing management. Will continue to prescribe until new Pain Specialist is identified and she has an appt She clearly understands the absolute need to remain fully compliant with her current contract, esp in taking her medication exactly as prescribed, and ensuring that no one other than herself has access to her medication  Depression with anxiety Not suicidal or homicidal, however her personal ;life continues to be a main cause of despair and depression, reports that her  only living child recently "had to  "move out" because she did not like / agree with her lifestyle.Her Grandchildren are also a source of stress Start medication, no interest in therapy , does not feel that she would benefit F/u in 6 to 7 weeks  Essential hypertension, benign .cpon1 DASH diet and commitment to daily physical activity for a minimum of 30 minutes discussed and encouraged, as a part of hypertension management. The importance of attaining a healthy weight is also discussed.  BP/Weight 05/26/2016 02/05/2016 10/25/2015 10/02/2015 04/26/2015 04/09/2015 0000000  Systolic BP [id number] 0000000 0000000 123456 [id number] 123456 Q000111Q  Diastolic BP 78 80 76 74 68 70 94  Wt. (Lbs) 205 210 197 207.12 207 214 206  BMI 34.11 34.95 32.78 34.47 34.45 35.61 34.28            DM (diabetes mellitus), type 2 with renal complications Controlled, no change in management Ms. Heberlein is reminded of the importance of commitment to daily physical activity for 30 minutes or more, as able and the need to limit carbohydrate intake to 30 to 60 grams per meal to help with blood sugar control.  Updated lab needed at/ before  next visit.      Ms. Biese is reminded of the importance of daily foot exam, annual eye examination, and good blood sugar, blood pressure and cholesterol control.  Diabetic Labs Latest Ref Rng 01/07/2016 10/02/2015 09/11/2015 05/10/2015 04/04/2015  HbA1c <5.7 % 7.4(H) - 7.2(H) - 6.9(H)  Microalbumin Not estab mg/dL - 2.4 - - -  Micro/Creat Ratio <30 mcg/mg creat - 22 - - -  Chol 125 - 200 mg/dL 173 - 159 - 137  HDL >=46 mg/dL 32(L) - 38(L) - 32(L)  Calc LDL <130 mg/dL 79 - 85 - 62  Triglycerides <150 mg/dL 310(H) - 178(H) - 215(H)  Creatinine 0.60 - 0.93 mg/dL 1.39(H) - 1.39(H) 1.62(H) 1.49(H)   BP/Weight 05/26/2016 02/05/2016 10/25/2015 10/02/2015 04/26/2015 04/09/2015 0000000  Systolic BP [id number] 0000000 0000000 123456 [id number] 123456 Q000111Q  Diastolic BP 78 80 76 74 68 70 94  Wt. (Lbs) 205 210 197 207.12 207 214 206  BMI 34.11 34.95 32.78 34.47 34.45 35.61 34.28   Foot/eye exam completion dates Latest Ref Rng 02/05/2016 10/02/2015  Eye Exam No Retinopathy No Retinopathy -  Foot exam Order - - -  Foot Form Completion - - Done         Medication monitoring encounter Recently failed UDS requiring change in chronic pain management , and also change in mental health management, needs to be treated for pain through pain clinic, referral entered

## 2016-05-31 NOTE — Assessment & Plan Note (Signed)
Recently failed UDS requiring change in chronic pain management , and also change in mental health management, needs to be treated for pain through pain clinic, referral entered

## 2016-05-31 NOTE — Assessment & Plan Note (Signed)
Established severe disease including disc protrusion and nerve compression s/p back surgery, cervical and lumbar Failed recent UDS unfortunately and will need pain management for ongoing management. Will continue to prescribe until new Pain Specialist is identified and she has an appt She clearly understands the absolute need to remain fully compliant with her current contract, esp in taking her medication exactly as prescribed, and ensuring that no one other than herself has access to her medication

## 2016-05-31 NOTE — Assessment & Plan Note (Addendum)
Controlled, no change in management Tammy Suarez is reminded of the importance of commitment to daily physical activity for 30 minutes or more, as able and the need to limit carbohydrate intake to 30 to 60 grams per meal to help with blood sugar control.  Updated lab needed at/ before next visit.      Tammy Suarez is reminded of the importance of daily foot exam, annual eye examination, and good blood sugar, blood pressure and cholesterol control.  Diabetic Labs Latest Ref Rng 01/07/2016 10/02/2015 09/11/2015 05/10/2015 04/04/2015  HbA1c <5.7 % 7.4(H) - 7.2(H) - 6.9(H)  Microalbumin Not estab mg/dL - 2.4 - - -  Micro/Creat Ratio <30 mcg/mg creat - 22 - - -  Chol 125 - 200 mg/dL 173 - 159 - 137  HDL >=46 mg/dL 32(L) - 38(L) - 32(L)  Calc LDL <130 mg/dL 79 - 85 - 62  Triglycerides <150 mg/dL 310(H) - 178(H) - 215(H)  Creatinine 0.60 - 0.93 mg/dL 1.39(H) - 1.39(H) 1.62(H) 1.49(H)   BP/Weight 05/26/2016 02/05/2016 10/25/2015 10/02/2015 04/26/2015 04/09/2015 0000000  Systolic BP [id number] 0000000 0000000 123456 [id number] 123456 Q000111Q  Diastolic BP 78 80 76 74 68 70 94  Wt. (Lbs) 205 210 197 207.12 207 214 206  BMI 34.11 34.95 32.78 34.47 34.45 35.61 34.28   Foot/eye exam completion dates Latest Ref Rng 02/05/2016 10/02/2015  Eye Exam No Retinopathy No Retinopathy -  Foot exam Order - - -  Foot Form Completion - - Done

## 2016-06-01 ENCOUNTER — Ambulatory Visit (INDEPENDENT_AMBULATORY_CARE_PROVIDER_SITE_OTHER): Payer: Medicare Other | Admitting: Orthopedic Surgery

## 2016-06-01 ENCOUNTER — Encounter: Payer: Self-pay | Admitting: Orthopedic Surgery

## 2016-06-01 VITALS — BP 146/71 | Ht 62.0 in | Wt 202.0 lb

## 2016-06-01 DIAGNOSIS — G5603 Carpal tunnel syndrome, bilateral upper limbs: Secondary | ICD-10-CM | POA: Diagnosis not present

## 2016-06-01 NOTE — Progress Notes (Signed)
Patient ID: Tammy Suarez, female   DOB: 07/26/1946, 70 y.o.   MRN: [id number]  Chief Complaint  Patient presents with  . Carpal Tunnel    eval bilateral carpal tunnel    HPI Tammy Suarez is a 70 y.o. female.  Percent at the request of Dr. Marlou Sa cough for evaluation of bilateral carpal tunnel syndrome left worse than right positive carpal tunnel test complains of bilateral burning stabbing aching radiating pain both hands wrist 2 fingertips with decreased sensation thumb index long finger numbness and tingling night pain dropping things aching pain over the wrist no prior treatment symptoms present over a year pain levels vary between 6 and 9  Review of Systems Review of Systems  Fatigue sinusitis dental problems vision problems cough depression anxiety excessive night urination bladder control loss abdominal pain constipation numbness tingling burning pain leg seasonal allergies back pain   Past Medical History  Diagnosis Date  . Vitamin B 12 deficiency   . Chronic back pain   . GERD (gastroesophageal reflux disease)   . Hyperlipidemia   . Obesity   . Essential hypertension, benign   . Type 2 diabetes mellitus without complications ([place])   . History of conjunctivitis   . Coronary atherosclerosis of native coronary artery     Multivessel status post CABG - LIMA to LAD, SVG to OM and SVG to PDA  . CKD (chronic kidney disease) stage 3, GFR 30-59 ml/min     Past Surgical History  Procedure Laterality Date  . Cervical laminectomy    . Cholecystectomy    . Inguinal herniorrhaphy    . Vesicovaginal fistula closure w/ tah    . Lumbar fusion    . Left index finger repair    . Laparoscopic gastric banding  2009  . Cosmetic surgery  07/01/2012    Recurrent skin infection on lower abdomen, [facility]  . Abdominal hysterectomy    . Coronary artery bypass graft N/A 11/02/2013    Procedure: CORONARY ARTERY BYPASS GRAFTING (CABG);  Surgeon: Gaye Pollack, MD;  Location: [facility];  Service:  Open Heart Surgery;  Laterality: N/A;  CABG x three, using left internal mammary artery and right leg greater saphenous vein harvested endoscopically  . Intraoperative transesophageal echocardiogram N/A 11/02/2013    Procedure: INTRAOPERATIVE TRANSESOPHAGEAL ECHOCARDIOGRAM;  Surgeon: Gaye Pollack, MD;  Location: [facility];  Service: Open Heart Surgery;  Laterality: N/A;  . Hernia repair    . Back surgery    . Cardiac surgery    . Left heart catheterization with coronary angiogram N/A 10/30/2013    Procedure: LEFT HEART CATHETERIZATION WITH CORONARY ANGIOGRAM;  Surgeon: Minus Breeding, MD;  Location: [facility] CATH LAB;  Service: Cardiovascular;  Laterality: N/A;  . Spine surgery      Family History  Problem Relation Age of Onset  . Heart attack Mother   . Heart attack Father   . Hypertension Sister   . Heart attack Sister   . Cancer Brother     Renal cell     Social History Social History  Substance Use Topics  . Smoking status: Former Smoker -- 1.50 packs/day for 25 years    Types: Cigarettes  . Smokeless tobacco: Former Systems developer    Quit date: 11/14/2014     Comment: quit 2 months ago   . Alcohol Use: No    Allergies  Allergen Reactions  . Meperidine Hcl Nausea And Vomiting  . Niacin Other (See Comments)    REACTION: face peeling and  burning  . Propoxyphene N-Acetaminophen Nausea And Vomiting    Current Outpatient Prescriptions  Medication Sig Dispense Refill  . amitriptyline (ELAVIL) 25 MG tablet TAKE 1 TABLET BY MOUTH AT BEDTIME 30 tablet 2  . aspirin EC 81 MG tablet Take 81 mg by mouth daily.    . Aspirin-Acetaminophen-Caffeine (GOODY HEADACHE PO) Take 1 Package by mouth 2 (two) times daily as needed (headache).    Marland Kitchen buPROPion (WELLBUTRIN XL) 300 MG 24 hr tablet TAKE 1 TABLET BY MOUTH EVERY MORNING 90 tablet 1  . Choline Fenofibrate (FENOFIBRIC ACID) 45 MG CPDR TAKE ONE CAPSULE BY MOUTH EVERY DAY *10/7* 90 capsule 1  . clonazePAM (KLONOPIN) 1 MG tablet TAKE 1 TABLET BY MOUTH 3  TIMES A DAY 90 tablet 2  . DULoxetine (CYMBALTA) 60 MG capsule Take 1 capsule (60 mg total) by mouth 2 (two) times daily. 180 capsule 1  . fenofibrate (TRICOR) 145 MG tablet TAKE 1 TABLET BY MOUTH EVERYDAY 30 tablet 5  . FLUoxetine (PROZAC) 20 MG tablet Take 1 tablet (20 mg total) by mouth daily. 30 tablet 3  . furosemide (LASIX) 20 MG tablet Take daily as needed for leg swelling 30 tablet 6  . gabapentin (NEURONTIN) 300 MG capsule Take 1 capsule (300 mg total) by mouth at bedtime. 30 capsule 4  . HYDROcodone-acetaminophen (NORCO) 10-325 MG tablet Take 1 tablet by mouth 3 (three) times daily. 90 tablet 0  . levothyroxine (SYNTHROID, LEVOTHROID) 100 MCG tablet TAKE 1 TABLET BY MOUTH EVERY DAY *DOSE CHANGE* 30 tablet 2  . loratadine (CLARITIN) 10 MG tablet Take 1 tablet (10 mg total) by mouth daily as needed for allergies. 30 tablet 6  . metoprolol succinate (TOPROL-XL) 25 MG 24 hr tablet TAKE 1 TABLET BY MOUTH DAILY 90 tablet 0  . Multiple Vitamin (MULTIVITAMIN) tablet Take 1 tablet by mouth daily.    . nitroGLYCERIN (NITROSTAT) 0.4 MG SL tablet PLACE 1 TABLET UNDER THE TONGUE EVERY 5 MINUTES AS NEEDED FOR CHEST PAIN 25 tablet 1  . Omega-3 Fatty Acids (FISH OIL PO) Take 1 capsule by mouth daily.    Marland Kitchen oxybutynin (DITROPAN-XL) 10 MG 24 hr tablet Take 1 tablet (10 mg total) by mouth at bedtime. 30 tablet 4  . ranitidine (ZANTAC) 150 MG tablet Take 1 tablet (150 mg total) by mouth 2 (two) times daily. 180 tablet 1  . simvastatin (ZOCOR) 40 MG tablet TAKE 1 TABLET (40 MG TOTAL) BY MOUTH AT BEDTIME. 90 tablet 1   No current facility-administered medications for this visit.     Physical Exam Blood pressure 146/71, height 5\' 2"  (1.575 m), weight 202 lb (91.627 kg). Physical Exam The patient is well developed well nourished and well groomed.  Orientation to person place and time is normal  Mood is pleasant.  Ambulatory status Weightbearing as tolerated without significant limping  Right and left  Upper extremity examination reveals the following:  Inspection reveals no swelling. There is tenderness over the carpal tunnel.  Range of motion of the wrist and elbow are normal  Motor exam shows mild weakness with grip strength.  Wrist joint is stable  Provocative tests for carpal tunnel Phalen's test  positive Carpal tunnel compression test positive  Pulses are normal in the radial and ulnar artery with a normal Allen's test.  Decreased sensation is noted in the median nerve distribution. Soft touch is normal.    Data Reviewed  independent image interpretation :  Nerve study does not have interpretation but latencies appear long  Assessment    Bilateral CTS    Plan    Bilateral Wrist splint F/U 6 weeks if no improvement recommend left carpal tunnel release

## 2016-06-01 NOTE — Patient Instructions (Addendum)
Wear braces all the time   Carpal Tunnel Syndrome Carpal tunnel syndrome is a condition that causes pain in your hand and arm. The carpal tunnel is a narrow area located on the palm side of your wrist. Repeated wrist motion or certain diseases may cause swelling within the tunnel. This swelling pinches the main nerve in the wrist (median nerve). CAUSES  This condition may be caused by:   Repeated wrist motions.  Wrist injuries.  Arthritis.  A cyst or tumor in the carpal tunnel.  Fluid buildup during pregnancy. Sometimes the cause of this condition is not known.  RISK FACTORS This condition is more likely to develop in:   People who have jobs that cause them to repeatedly move their wrists in the same motion, such as butchers and cashiers.  Women.  People with certain conditions, such as:  Diabetes.  Obesity.  An underactive thyroid (hypothyroidism).  Kidney failure. SYMPTOMS  Symptoms of this condition include:   A tingling feeling in your fingers, especially in your thumb, index, and middle fingers.  Tingling or numbness in your hand.  An aching feeling in your entire arm, especially when your wrist and elbow are bent for long periods of time.  Wrist pain that goes up your arm to your shoulder.  Pain that goes down into your palm or fingers.  A weak feeling in your hands. You may have trouble grabbing and holding items. Your symptoms may feel worse during the night.  DIAGNOSIS  This condition is diagnosed with a medical history and physical exam. You may also have tests, including:   An electromyogram (EMG). This test measures electrical signals sent by your nerves into the muscles.  X-rays. TREATMENT  Treatment for this condition includes:  Lifestyle changes. It is important to stop doing or modify the activity that caused your condition.  Physical or occupational therapy.  Medicines for pain and inflammation. This may include medicine that is injected  into your wrist.  A wrist splint.  Surgery. HOME CARE INSTRUCTIONS  If You Have a Splint:  Wear it as told by your health care provider. Remove it only as told by your health care provider.  Loosen the splint if your fingers become numb and tingle, or if they turn cold and blue.  Keep the splint clean and dry. General Instructions  Take over-the-counter and prescription medicines only as told by your health care provider.  Rest your wrist from any activity that may be causing your pain. If your condition is work related, talk to your employer about changes that can be made, such as getting a wrist pad to use while typing.  If directed, apply ice to the painful area:  Put ice in a plastic bag.  Place a towel between your skin and the bag.  Leave the ice on for 20 minutes, 2-3 times per day.  Keep all follow-up visits as told by your health care provider. This is important.  Do any exercises as told by your health care provider, physical therapist, or occupational therapist. [place] IF:   You have new symptoms.  Your pain is not controlled with medicines.  Your symptoms get worse.   This information is not intended to replace advice given to you by your health care provider. Make sure you discuss any questions you have with your health care provider.   Document Released: 10/30/2000 Document Revised: 07/24/2015 Document Reviewed: 03/20/2015 Elsevier Interactive Patient Education 2016 [place] Syndrome Carpal tunnel syndrome  is a condition that causes pain in your hand and arm. The carpal tunnel is a narrow area located on the palm side of your wrist. Repeated wrist motion or certain diseases may cause swelling within the tunnel. This swelling pinches the main nerve in the wrist (median nerve). CAUSES  This condition may be caused by:   Repeated wrist motions.  Wrist injuries.  Arthritis.  A cyst or tumor in the carpal tunnel.  Fluid  buildup during pregnancy. Sometimes the cause of this condition is not known.  RISK FACTORS This condition is more likely to develop in:   People who have jobs that cause them to repeatedly move their wrists in the same motion, such as butchers and cashiers.  Women.  People with certain conditions, such as:  Diabetes.  Obesity.  An underactive thyroid (hypothyroidism).  Kidney failure. SYMPTOMS  Symptoms of this condition include:   A tingling feeling in your fingers, especially in your thumb, index, and middle fingers.  Tingling or numbness in your hand.  An aching feeling in your entire arm, especially when your wrist and elbow are bent for long periods of time.  Wrist pain that goes up your arm to your shoulder.  Pain that goes down into your palm or fingers.  A weak feeling in your hands. You may have trouble grabbing and holding items. Your symptoms may feel worse during the night.  DIAGNOSIS  This condition is diagnosed with a medical history and physical exam. You may also have tests, including:   An electromyogram (EMG). This test measures electrical signals sent by your nerves into the muscles.  X-rays. TREATMENT  Treatment for this condition includes:  Lifestyle changes. It is important to stop doing or modify the activity that caused your condition.  Physical or occupational therapy.  Medicines for pain and inflammation. This may include medicine that is injected into your wrist.  A wrist splint.  Surgery. HOME CARE INSTRUCTIONS  If You Have a Splint:  Wear it as told by your health care provider. Remove it only as told by your health care provider.  Loosen the splint if your fingers become numb and tingle, or if they turn cold and blue.  Keep the splint clean and dry. General Instructions  Take over-the-counter and prescription medicines only as told by your health care provider.  Rest your wrist from any activity that may be causing your  pain. If your condition is work related, talk to your employer about changes that can be made, such as getting a wrist pad to use while typing.  If directed, apply ice to the painful area:  Put ice in a plastic bag.  Place a towel between your skin and the bag.  Leave the ice on for 20 minutes, 2-3 times per day.  Keep all follow-up visits as told by your health care provider. This is important.  Do any exercises as told by your health care provider, physical therapist, or occupational therapist. [place] IF:   You have new symptoms.  Your pain is not controlled with medicines.  Your symptoms get worse.   This information is not intended to replace advice given to you by your health care provider. Make sure you discuss any questions you have with your health care provider.   Document Released: 10/30/2000 Document Revised: 07/24/2015 Document Reviewed: 03/20/2015 Elsevier Interactive Patient Education [employer].

## 2016-06-02 ENCOUNTER — Ambulatory Visit (INDEPENDENT_AMBULATORY_CARE_PROVIDER_SITE_OTHER): Payer: Medicare Other | Admitting: Cardiology

## 2016-06-02 ENCOUNTER — Encounter: Payer: Self-pay | Admitting: Cardiology

## 2016-06-02 VITALS — BP 158/94 | HR 68 | Ht 63.0 in | Wt 203.0 lb

## 2016-06-02 DIAGNOSIS — I25119 Atherosclerotic heart disease of native coronary artery with unspecified angina pectoris: Secondary | ICD-10-CM | POA: Diagnosis not present

## 2016-06-02 DIAGNOSIS — E785 Hyperlipidemia, unspecified: Secondary | ICD-10-CM

## 2016-06-02 DIAGNOSIS — N183 Chronic kidney disease, stage 3 (moderate): Secondary | ICD-10-CM | POA: Diagnosis not present

## 2016-06-02 DIAGNOSIS — I10 Essential (primary) hypertension: Secondary | ICD-10-CM

## 2016-06-02 NOTE — Progress Notes (Signed)
Cardiology Office Note  Date: 06/02/2016   ID: Tammy Suarez, DOB 1946-04-28, MRN [id number]  PCP: Tula Nakayama, MD  Primary Cardiologist: Rozann Lesches, MD   Chief Complaint  Patient presents with  . Coronary Artery Disease    History of Present Illness: Tammy Suarez is a 70 y.o. female last seen in December 2016. She presents for a routine follow-up visit. From a cardiac perspective she does not report any major change in stamina. Describes occasional brief episodes of chest pain for which she takes nitroglycerin. This has not been progressive. She continues to have problems with chronic back pain, has been having some epidural pain injections with benefit. Also recently experiencing trouble with carpal tunnel syndrome, currently wearing wrist braces.  She is status post CABG in 2014 as outlined below. Medications include aspirin, TriCor, Lasix, Toprol-XL, as needed nitroglycerin, and Zocor.  She continues to follow with Dr. Moshe Cipro for primary care. I reviewed the recent note. Blood pressure elevated today, but trend has not been this high in general on reviewing the chart. Discussed low salt diet. If she needed additional medication would consider ARB.  Past Medical History  Diagnosis Date  . Vitamin B 12 deficiency   . Chronic back pain   . GERD (gastroesophageal reflux disease)   . Hyperlipidemia   . Obesity   . Essential hypertension, benign   . Type 2 diabetes mellitus without complications ([place])   . History of conjunctivitis   . Coronary atherosclerosis of native coronary artery     Multivessel status post CABG - LIMA to LAD, SVG to OM and SVG to PDA  . CKD (chronic kidney disease) stage 3, GFR 30-59 ml/min     Past Surgical History  Procedure Laterality Date  . Cervical laminectomy    . Cholecystectomy    . Inguinal herniorrhaphy    . Vesicovaginal fistula closure w/ tah    . Lumbar fusion    . Left index finger repair    . Laparoscopic gastric  banding  2009  . Cosmetic surgery  07/01/2012    Recurrent skin infection on lower abdomen, [facility]  . Abdominal hysterectomy    . Coronary artery bypass graft N/A 11/02/2013    Procedure: CORONARY ARTERY BYPASS GRAFTING (CABG);  Surgeon: Gaye Pollack, MD;  Location: [facility];  Service: Open Heart Surgery;  Laterality: N/A;  CABG x three, using left internal mammary artery and right leg greater saphenous vein harvested endoscopically  . Intraoperative transesophageal echocardiogram N/A 11/02/2013    Procedure: INTRAOPERATIVE TRANSESOPHAGEAL ECHOCARDIOGRAM;  Surgeon: Gaye Pollack, MD;  Location: [facility] OR;  Service: Open Heart Surgery;  Laterality: N/A;  . Hernia repair    . Back surgery    . Cardiac surgery    . Left heart catheterization with coronary angiogram N/A 10/30/2013    Procedure: LEFT HEART CATHETERIZATION WITH CORONARY ANGIOGRAM;  Surgeon: Minus Breeding, MD;  Location: [facility] CATH LAB;  Service: Cardiovascular;  Laterality: N/A;  . Spine surgery      Current Outpatient Prescriptions  Medication Sig Dispense Refill  . amitriptyline (ELAVIL) 25 MG tablet TAKE 1 TABLET BY MOUTH AT BEDTIME 30 tablet 2  . aspirin EC 81 MG tablet Take 81 mg by mouth daily.    . Aspirin-Acetaminophen-Caffeine (GOODY HEADACHE PO) Take 1 Package by mouth 2 (two) times daily as needed (headache).    Marland Kitchen buPROPion (WELLBUTRIN XL) 300 MG 24 hr tablet TAKE 1 TABLET BY MOUTH EVERY MORNING 90 tablet 1  .  [place]oline Fenofibrate (FENOFIBRIC ACID) 45 MG CPDR TAKE ONE CAPSULE BY MOUTH EVERY DAY *10/7* 90 capsule 1  . clonazePAM (KLONOPIN) 1 MG tablet TAKE 1 TABLET BY MOUTH 3 TIMES A DAY 90 tablet 2  . DULoxetine (CYMBALTA) 60 MG capsule Take 1 capsule (60 mg total) by mouth 2 (two) times daily. 180 capsule 1  . fenofibrate (TRICOR) 145 MG tablet TAKE 1 TABLET BY MOUTH EVERYDAY 30 tablet 5  . FLUoxetine (PROZAC) 20 MG tablet Take 1 tablet (20 mg total) by mouth daily. 30 tablet 3  . furosemide (LASIX) 20 MG tablet Take daily  as needed for leg swelling 30 tablet 6  . gabapentin (NEURONTIN) 300 MG capsule Take 1 capsule (300 mg total) by mouth at bedtime. 30 capsule 4  . HYDROcodone-acetaminophen (NORCO) 10-325 MG tablet Take 1 tablet by mouth 3 (three) times daily. 90 tablet 0  . levothyroxine (SYNTHROID, LEVOTHROID) 100 MCG tablet TAKE 1 TABLET BY MOUTH EVERY DAY *DOSE CHANGE* 30 tablet 2  . loratadine (CLARITIN) 10 MG tablet Take 1 tablet (10 mg total) by mouth daily as needed for allergies. 30 tablet 6  . metoprolol succinate (TOPROL-XL) 25 MG 24 hr tablet TAKE 1 TABLET BY MOUTH DAILY 90 tablet 0  . Multiple Vitamin (MULTIVITAMIN) tablet Take 1 tablet by mouth daily.    . nitroGLYCERIN (NITROSTAT) 0.4 MG SL tablet PLACE 1 TABLET UNDER THE TONGUE EVERY 5 MINUTES AS NEEDED FOR CHEST PAIN 25 tablet 1  . Omega-3 Fatty Acids (FISH OIL PO) Take 1 capsule by mouth daily.    Marland Kitchen oxybutynin (DITROPAN-XL) 10 MG 24 hr tablet Take 1 tablet (10 mg total) by mouth at bedtime. 30 tablet 4  . ranitidine (ZANTAC) 150 MG tablet Take 1 tablet (150 mg total) by mouth 2 (two) times daily. 180 tablet 1  . simvastatin (ZOCOR) 40 MG tablet TAKE 1 TABLET (40 MG TOTAL) BY MOUTH AT BEDTIME. 90 tablet 1   No current facility-administered medications for this visit.   Allergies:  Meperidine hcl; Niacin; and Propoxyphene n-acetaminophen   Social History: The patient  reports that she has been smoking Cigarettes.  She has a 37.5 pack-year smoking history. She quit smokeless tobacco use about 18 months ago. She reports that she does not drink alcohol or use illicit drugs.   ROS:  Please see the history of present illness. Otherwise, complete review of systems is positive for bilateral finger tingling, chronic back pain.  All other systems are reviewed and negative.   Physical Exam: VS:  BP 158/94 mmHg  Pulse 68  Ht 5\' 3"  (1.6 m)  Wt 203 lb (92.08 kg)  BMI 35.97 kg/m2, BMI Body mass index is 35.97 kg/(m^2).  Wt Readings from Last 3  Encounters:  06/02/16 203 lb (92.08 kg)  06/01/16 202 lb (91.627 kg)  05/26/16 205 lb (92.987 kg)    Overweight woman, appears comfortable at rest.  HEENT: Conjunctiva and lids normal, oropharynx clear.  Neck: Supple, no elevated JVP or carotid bruits, no thyromegaly.  Lungs: Clear to auscultation, nonlabored breathing at rest.  Cardiac: Regular rate and rhythm, no S3 or significant systolic murmur, no pericardial rub.  Abdomen: Soft, nontender, bowel sounds present, no guarding or rebound.  Extremities: No pitting edema, distal pulses 2+. Bilateral wrist braces in place. Skin: Warm and dry. Musculoskeletal: No kyphosis. Neuropsychiatric: Alert and oriented 3, affect appropriate.   ECG: I personally reviewed the tracing from 10/25/2015 which showed sinus rhythm with borderline low voltage and decreased R wave  progression.  Recent Labwork: 09/11/2015: Hemoglobin 12.2; Platelets 286 01/07/2016: ALT 9; AST 17; BUN 29*; Creat 1.39*; Potassium 4.6; Sodium 138; TSH 7.32*     Component Value Date/Time   CHOL 173 01/07/2016 0948   TRIG 310* 01/07/2016 0948   HDL 32* 01/07/2016 0948   CHOLHDL 5.4* 01/07/2016 0948   VLDL 62* 01/07/2016 0948   LDLCALC 79 01/07/2016 0948    Other Studies Reviewed Today:  Echocardiogram 11/01/2013: Study Conclusions  Left ventricle: The cavity size was normal. Wall thickness was normal. Systolic function was normal. The estimated ejection fraction was in the range of 55% to 60%. Wall motion was normal; there were no regional wall motion abnormalities. There was an increased relative contribution of atrial contraction to ventricular filling.  Assessment and Plan:  1. Multivessel CAD status post CABG in 2014. LVEF normal range by echocardiogram at that time. No overall change in functional capacity. Continue medical therapy and observation.  2. Essential hypertension, keep an eye on blood pressure trend with Dr. Moshe Cipro. Low-salt diet  recommended. If additional agent needed for blood pressure control would consider ARB.  3. Hyperlipidemia, continues on Zocor. LDL 79 in February.  4. CKD stage 3, creatinine 1.4.  Current medicines were reviewed with the patient today.  Disposition: Follow-up with be in 6 months.  Signed, Satira Sark, MD, [facility] 06/02/2016 2:02 PM    [place] [facility] [address] [place], [place] [address] Phone: [phone]; Fax: [phone]

## 2016-06-02 NOTE — Patient Instructions (Signed)
Your physician wants you to follow-up in: 6 months with Dr. McDowell You will receive a reminder letter in the mail two months in advance. If you don't receive a letter, please call our office to schedule the follow-up appointment.  Your physician recommends that you continue on your current medications as directed. Please refer to the Current Medication list given to you today.  Thank you for choosing [facility]!!    

## 2016-06-06 ENCOUNTER — Other Ambulatory Visit: Payer: Self-pay | Admitting: Family Medicine

## 2016-06-06 ENCOUNTER — Encounter: Payer: Self-pay | Admitting: Family Medicine

## 2016-06-07 ENCOUNTER — Other Ambulatory Visit: Payer: Self-pay | Admitting: Family Medicine

## 2016-06-12 ENCOUNTER — Other Ambulatory Visit: Payer: Self-pay | Admitting: Family Medicine

## 2016-06-12 DIAGNOSIS — N39498 Other specified urinary incontinence: Secondary | ICD-10-CM

## 2016-06-17 ENCOUNTER — Emergency Department (HOSPITAL_COMMUNITY)
Admission: EM | Admit: 2016-06-17 | Discharge: 2016-06-17 | Discharge status: Against Medical Advice | Payer: No Typology Code available for payment source | Attending: Emergency Medicine | Admitting: Emergency Medicine

## 2016-06-17 ENCOUNTER — Encounter (HOSPITAL_COMMUNITY): Payer: Self-pay

## 2016-06-17 ENCOUNTER — Emergency Department (HOSPITAL_COMMUNITY): Payer: No Typology Code available for payment source

## 2016-06-17 ENCOUNTER — Other Ambulatory Visit: Payer: Self-pay

## 2016-06-17 DIAGNOSIS — K436 Other and unspecified ventral hernia with obstruction, without gangrene: Secondary | ICD-10-CM | POA: Insufficient documentation

## 2016-06-17 DIAGNOSIS — S2001XA Contusion of right breast, initial encounter: Secondary | ICD-10-CM

## 2016-06-17 DIAGNOSIS — Z79899 Other long term (current) drug therapy: Secondary | ICD-10-CM | POA: Insufficient documentation

## 2016-06-17 DIAGNOSIS — S0990XA Unspecified injury of head, initial encounter: Secondary | ICD-10-CM | POA: Diagnosis present

## 2016-06-17 DIAGNOSIS — E039 Hypothyroidism, unspecified: Secondary | ICD-10-CM | POA: Diagnosis not present

## 2016-06-17 DIAGNOSIS — S22010A Wedge compression fracture of first thoracic vertebra, initial encounter for closed fracture: Secondary | ICD-10-CM | POA: Diagnosis not present

## 2016-06-17 DIAGNOSIS — E1122 Type 2 diabetes mellitus with diabetic chronic kidney disease: Secondary | ICD-10-CM | POA: Diagnosis not present

## 2016-06-17 DIAGNOSIS — Z951 Presence of aortocoronary bypass graft: Secondary | ICD-10-CM | POA: Insufficient documentation

## 2016-06-17 DIAGNOSIS — S3991XA Unspecified injury of abdomen, initial encounter: Secondary | ICD-10-CM | POA: Diagnosis not present

## 2016-06-17 DIAGNOSIS — Y92410 Unspecified street and highway as the place of occurrence of the external cause: Secondary | ICD-10-CM | POA: Insufficient documentation

## 2016-06-17 DIAGNOSIS — I2510 Atherosclerotic heart disease of native coronary artery without angina pectoris: Secondary | ICD-10-CM | POA: Diagnosis not present

## 2016-06-17 DIAGNOSIS — Y9389 Activity, other specified: Secondary | ICD-10-CM | POA: Diagnosis not present

## 2016-06-17 DIAGNOSIS — R079 Chest pain, unspecified: Secondary | ICD-10-CM | POA: Diagnosis not present

## 2016-06-17 DIAGNOSIS — S064X0A Epidural hemorrhage without loss of consciousness, initial encounter: Secondary | ICD-10-CM | POA: Diagnosis not present

## 2016-06-17 DIAGNOSIS — Y999 Unspecified external cause status: Secondary | ICD-10-CM | POA: Insufficient documentation

## 2016-06-17 DIAGNOSIS — S098XXA Other specified injuries of head, initial encounter: Secondary | ICD-10-CM | POA: Diagnosis not present

## 2016-06-17 DIAGNOSIS — S0083XA Contusion of other part of head, initial encounter: Secondary | ICD-10-CM | POA: Diagnosis not present

## 2016-06-17 DIAGNOSIS — Z7982 Long term (current) use of aspirin: Secondary | ICD-10-CM | POA: Diagnosis not present

## 2016-06-17 DIAGNOSIS — N183 Chronic kidney disease, stage 3 (moderate): Secondary | ICD-10-CM | POA: Diagnosis not present

## 2016-06-17 DIAGNOSIS — S299XXA Unspecified injury of thorax, initial encounter: Secondary | ICD-10-CM | POA: Diagnosis not present

## 2016-06-17 DIAGNOSIS — IMO0001 Reserved for inherently not codable concepts without codable children: Secondary | ICD-10-CM

## 2016-06-17 DIAGNOSIS — S0003XA Contusion of scalp, initial encounter: Secondary | ICD-10-CM | POA: Diagnosis not present

## 2016-06-17 DIAGNOSIS — F17210 Nicotine dependence, cigarettes, uncomplicated: Secondary | ICD-10-CM | POA: Diagnosis not present

## 2016-06-17 DIAGNOSIS — S22019A Unspecified fracture of first thoracic vertebra, initial encounter for closed fracture: Secondary | ICD-10-CM | POA: Diagnosis not present

## 2016-06-17 DIAGNOSIS — S8002XA Contusion of left knee, initial encounter: Secondary | ICD-10-CM | POA: Insufficient documentation

## 2016-06-17 DIAGNOSIS — I129 Hypertensive chronic kidney disease with stage 1 through stage 4 chronic kidney disease, or unspecified chronic kidney disease: Secondary | ICD-10-CM | POA: Insufficient documentation

## 2016-06-17 DIAGNOSIS — M4850XA Collapsed vertebra, not elsewhere classified, site unspecified, initial encounter for fracture: Secondary | ICD-10-CM

## 2016-06-17 DIAGNOSIS — R109 Unspecified abdominal pain: Secondary | ICD-10-CM | POA: Diagnosis not present

## 2016-06-17 DIAGNOSIS — S199XXA Unspecified injury of neck, initial encounter: Secondary | ICD-10-CM | POA: Diagnosis not present

## 2016-06-17 DIAGNOSIS — S22029A Unspecified fracture of second thoracic vertebra, initial encounter for closed fracture: Secondary | ICD-10-CM | POA: Diagnosis not present

## 2016-06-17 DIAGNOSIS — M25562 Pain in left knee: Secondary | ICD-10-CM

## 2016-06-17 HISTORY — DX: Carpal tunnel syndrome, unspecified upper limb: G56.00

## 2016-06-17 LAB — CBC WITH DIFFERENTIAL/PLATELET
Basophils Absolute: 0 10*3/uL (ref 0.0–0.1)
Basophils Relative: 0 %
EOS ABS: 0.2 10*3/uL (ref 0.0–0.7)
Eosinophils Relative: 2 %
HEMATOCRIT: 34.9 % — AB (ref 36.0–46.0)
HEMOGLOBIN: 11.8 g/dL — AB (ref 12.0–15.0)
LYMPHS ABS: 1.9 10*3/uL (ref 0.7–4.0)
Lymphocytes Relative: 31 %
MCH: 31.6 pg (ref 26.0–34.0)
MCHC: 33.8 g/dL (ref 30.0–36.0)
MCV: 93.6 fL (ref 78.0–100.0)
MONO ABS: 0.5 10*3/uL (ref 0.1–1.0)
MONOS PCT: 7 %
NEUTROS ABS: 3.8 10*3/uL (ref 1.7–7.7)
NEUTROS PCT: 60 %
Platelets: 229 10*3/uL (ref 150–400)
RBC: 3.73 MIL/uL — ABNORMAL LOW (ref 3.87–5.11)
RDW: 12.9 % (ref 11.5–15.5)
WBC: 6.3 10*3/uL (ref 4.0–10.5)

## 2016-06-17 LAB — TROPONIN I: Troponin I: <0.03 ng/mL (ref <0.03)

## 2016-06-17 LAB — I-STAT CHEM 8, ED
BUN: 28 mg/dL — AB (ref 6–20)
CALCIUM ION: 1.22 mmol/L (ref 1.12–1.23)
CHLORIDE: 97 mmol/L — AB (ref 101–111)
CREATININE: 1.5 mg/dL — AB (ref 0.44–1.00)
Glucose, Bld: 97 mg/dL (ref 65–99)
HCT: 37 % (ref 36.0–46.0)
Hemoglobin: 12.6 g/dL (ref 12.0–15.0)
Potassium: 4.2 mmol/L (ref 3.5–5.1)
SODIUM: 137 mmol/L (ref 135–145)
TCO2: 26 mmol/L (ref 0–100)

## 2016-06-17 MED ORDER — MORPHINE SULFATE (PF) 4 MG/ML IV SOLN
4.0000 mg | INTRAVENOUS | Status: DC | PRN
  Administered 2016-06-17: 4 mg via INTRAVENOUS
  Filled 2016-06-17: qty 1

## 2016-06-17 MED ORDER — HYDROCODONE-ACETAMINOPHEN 10-325 MG PO TABS: 1.0000 | ORAL_TABLET | Freq: Three times a day (TID) | ORAL | 0 refills | Status: DC

## 2016-06-17 MED ORDER — FENTANYL CITRATE (PF) 100 MCG/2ML IJ SOLN
50.0000 ug | INTRAMUSCULAR | Status: DC | PRN
  Administered 2016-06-17: 50 ug via INTRAVENOUS
  Filled 2016-06-17: qty 2

## 2016-06-17 MED ORDER — ASPIRIN 81 MG PO CHEW
324.0000 mg | CHEWABLE_TABLET | Freq: Once | ORAL | Status: AC
  Administered 2016-06-17: 324 mg via ORAL
  Filled 2016-06-17: qty 4

## 2016-06-17 MED ORDER — IOPAMIDOL (ISOVUE-300) INJECTION 61%
80.0000 mL | Freq: Once | INTRAVENOUS | Status: AC | PRN
  Administered 2016-06-17: 80 mL via INTRAVENOUS

## 2016-06-17 MED ORDER — ONDANSETRON HCL 4 MG/2ML IJ SOLN
4.0000 mg | INTRAMUSCULAR | Status: DC | PRN
  Administered 2016-06-17: 4 mg via INTRAVENOUS
  Filled 2016-06-17: qty 2

## 2016-06-17 MED ORDER — NITROGLYCERIN 0.4 MG SL SUBL
0.4000 mg | SUBLINGUAL_TABLET | SUBLINGUAL | Status: DC | PRN
  Administered 2016-06-17: 0.4 mg via SUBLINGUAL
  Filled 2016-06-17: qty 1

## 2016-06-17 MED ORDER — MORPHINE SULFATE (PF) 2 MG/ML IV SOLN
1.0000 mg | INTRAVENOUS | Status: DC | PRN
Start: 2016-06-17 — End: 2016-06-18
  Administered 2016-06-17: 1 mg via INTRAVENOUS
  Filled 2016-06-17: qty 1

## 2016-06-17 MED ORDER — SODIUM CHLORIDE 0.9 % IV SOLN
INTRAVENOUS | Status: DC
  Administered 2016-06-17: 17:00:00 via INTRAVENOUS

## 2016-06-17 NOTE — ED Notes (Signed)
Pt sitting up on side of bed at discharge eating fries and a hamburger,
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
## 2016-06-17 NOTE — ED Notes (Signed)
Transported to CT 
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
## 2016-06-17 NOTE — Discharge Instructions (Signed)
Take your usual prescriptions as previously directed.  Apply moist heat or ice to the area(s) of discomfort, for 15 minutes at a time, several times per day for the next few days.  Do not fall asleep on a heating or ice pack.  Call your regular medical doctor and your Cardiologist tomorrow to schedule a follow up appointment within the next 24 hours.  Return to the Emergency Department immediately if worsening.

## 2016-06-17 NOTE — ED Notes (Signed)
Pt states that her pain has changed very little, Dr Thurnell Garbe notified,

## 2016-06-21 ENCOUNTER — Other Ambulatory Visit: Payer: Self-pay | Admitting: Family Medicine

## 2016-06-22 DIAGNOSIS — G5603 Carpal tunnel syndrome, bilateral upper limbs: Secondary | ICD-10-CM | POA: Diagnosis not present

## 2016-06-22 DIAGNOSIS — Z79891 Long term (current) use of opiate analgesic: Secondary | ICD-10-CM | POA: Diagnosis not present

## 2016-06-22 DIAGNOSIS — I25119 Atherosclerotic heart disease of native coronary artery with unspecified angina pectoris: Secondary | ICD-10-CM | POA: Diagnosis not present

## 2016-06-22 DIAGNOSIS — N183 Chronic kidney disease, stage 3 (moderate): Secondary | ICD-10-CM | POA: Diagnosis not present

## 2016-06-22 DIAGNOSIS — I10 Essential (primary) hypertension: Secondary | ICD-10-CM | POA: Diagnosis not present

## 2016-06-22 DIAGNOSIS — M545 Low back pain: Secondary | ICD-10-CM | POA: Diagnosis not present

## 2016-06-22 DIAGNOSIS — E1140 Type 2 diabetes mellitus with diabetic neuropathy, unspecified: Secondary | ICD-10-CM | POA: Diagnosis not present

## 2016-06-22 DIAGNOSIS — E039 Hypothyroidism, unspecified: Secondary | ICD-10-CM | POA: Diagnosis not present

## 2016-06-22 DIAGNOSIS — S22000D Wedge compression fracture of unspecified thoracic vertebra, subsequent encounter for fracture with routine healing: Secondary | ICD-10-CM | POA: Diagnosis not present

## 2016-06-22 DIAGNOSIS — M5412 Radiculopathy, cervical region: Secondary | ICD-10-CM | POA: Diagnosis not present

## 2016-06-22 DIAGNOSIS — M5116 Intervertebral disc disorders with radiculopathy, lumbar region: Secondary | ICD-10-CM | POA: Diagnosis not present

## 2016-06-22 DIAGNOSIS — F331 Major depressive disorder, recurrent, moderate: Secondary | ICD-10-CM | POA: Diagnosis not present

## 2016-06-22 DIAGNOSIS — F419 Anxiety disorder, unspecified: Secondary | ICD-10-CM | POA: Diagnosis not present

## 2016-06-22 DIAGNOSIS — M5416 Radiculopathy, lumbar region: Secondary | ICD-10-CM | POA: Diagnosis not present

## 2016-06-22 DIAGNOSIS — Z79899 Other long term (current) drug therapy: Secondary | ICD-10-CM | POA: Diagnosis not present

## 2016-06-23 ENCOUNTER — Ambulatory Visit (INDEPENDENT_AMBULATORY_CARE_PROVIDER_SITE_OTHER): Payer: Medicare Other | Admitting: Family Medicine

## 2016-06-23 ENCOUNTER — Encounter: Payer: Self-pay | Admitting: Family Medicine

## 2016-06-23 VITALS — BP 146/82 | HR 72 | Resp 16 | Ht 64.0 in | Wt 204.0 lb

## 2016-06-23 DIAGNOSIS — E785 Hyperlipidemia, unspecified: Secondary | ICD-10-CM

## 2016-06-23 DIAGNOSIS — IMO0002 Reserved for concepts with insufficient information to code with codable children: Secondary | ICD-10-CM

## 2016-06-23 DIAGNOSIS — Z0000 Encounter for general adult medical examination without abnormal findings: Secondary | ICD-10-CM

## 2016-06-23 DIAGNOSIS — I10 Essential (primary) hypertension: Secondary | ICD-10-CM

## 2016-06-23 DIAGNOSIS — F17208 Nicotine dependence, unspecified, with other nicotine-induced disorders: Secondary | ICD-10-CM

## 2016-06-23 DIAGNOSIS — E1121 Type 2 diabetes mellitus with diabetic nephropathy: Secondary | ICD-10-CM

## 2016-06-23 DIAGNOSIS — R222 Localized swelling, mass and lump, trunk: Secondary | ICD-10-CM

## 2016-06-23 DIAGNOSIS — E038 Other specified hypothyroidism: Secondary | ICD-10-CM

## 2016-06-23 DIAGNOSIS — R229 Localized swelling, mass and lump, unspecified: Secondary | ICD-10-CM
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
## 2016-06-23 NOTE — Patient Instructions (Addendum)
F/u in 2.5 month, call if you need me sooner  You are referred for [place] of left buttock then will be referred to a surgeon if this is abnnormal   You are referred to cardiology re new exertional fatigue and chest pain  Please be careful not to fall and ensure home is safe  Current is 3 cigarettes daily, pLS commit to qutting in the near futre, yoday is the BEST day t commit and quit!   Fasting lipid, cmp and EGFR, HBA1c and TSH in 2.5 month  Pharmacy is notified to stop gabapentin and amitriptyline so pls stop BOTH Fall Prevention in the Home  Falls can cause injuries. They can happen to people of all ages. There are many things you can do to make your home safe and to help prevent falls.  WHAT CAN I DO ON THE OUTSIDE OF MY HOME?  Regularly fix the edges of walkways and driveways and fix any cracks.  Remove anything that might make you trip as you walk through a door, such as a raised step or threshold.  Trim any bushes or trees on the path to your home.  Use bright outdoor lighting.  Clear any walking paths of anything that might make someone trip, such as rocks or tools.  Regularly check to see if handrails are loose or broken. Make sure that both sides of any steps have handrails.  Any raised decks and porches should have guardrails on the edges.  Have any leaves, snow, or ice cleared regularly.  Use sand or salt on walking paths during winter.  Clean up any spills in your garage right away. This includes oil or grease spills. WHAT CAN I DO IN THE BATHROOM?   Use night lights.  Install grab bars by the toilet and in the tub and shower. Do not use towel bars as grab bars.  Use non-skid mats or decals in the tub or shower.  If you need to sit down in the shower, use a plastic, non-slip stool.  Keep the floor dry. Clean up any water that spills on the floor as soon as it happens.  Remove soap buildup in the tub or shower regularly.  Attach bath mats securely with  double-sided non-slip rug tape.  Do not have throw rugs and other things on the floor that can make you trip. WHAT CAN I DO IN THE BEDROOM?  Use night lights.  Make sure that you have a light by your bed that is easy to reach.  Do not use any sheets or blankets that are too big for your bed. They should not hang down onto the floor.  Have a firm chair that has side arms. You can use this for support while you get dressed.  Do not have throw rugs and other things on the floor that can make you trip. WHAT CAN I DO IN THE KITCHEN?  Clean up any spills right away.  Avoid walking on wet floors.  Keep items that you use a lot in easy-to-reach places.  If you need to reach something above you, use a strong step stool that has a grab bar.  Keep electrical cords out of the way.  Do not use floor polish or wax that makes floors slippery. If you must use wax, use non-skid floor wax.  Do not have throw rugs and other things on the floor that can make you trip. WHAT CAN I DO WITH MY STAIRS?  Do not leave any items on the  stairs.  Make sure that there are handrails on both sides of the stairs and use them. Fix handrails that are broken or loose. Make sure that handrails are as long as the stairways.  Check any carpeting to make sure that it is firmly attached to the stairs. Fix any carpet that is loose or worn.  Avoid having throw rugs at the top or bottom of the stairs. If you do have throw rugs, attach them to the floor with carpet tape.  Make sure that you have a light switch at the top of the stairs and the bottom of the stairs. If you do not have them, ask someone to add them for you. WHAT ELSE CAN I DO TO HELP PREVENT FALLS?  Wear shoes that:  Do not have high heels.  Have rubber bottoms.  Are comfortable and fit you well.  Are closed at the toe. Do not wear sandals.  If you use a stepladder:  Make sure that it is fully opened. Do not climb a closed stepladder.  Make  sure that both sides of the stepladder are locked into place.  Ask someone to hold it for you, if possible.  Clearly mark and make sure that you can see:  Any grab bars or handrails.  First and last steps.  Where the edge of each step is.  Use tools that help you move around (mobility aids) if they are needed. These include:  Canes.  Walkers.  Scooters.  Crutches.  Turn on the lights when you go into a dark area. Replace any light bulbs as soon as they burn out.  Set up your furniture so you have a clear path. Avoid moving your furniture around.  If any of your floors are uneven, fix them.  If there are any pets around you, be aware of where they are.  Review your medicines with your doctor. Some medicines can make you feel dizzy. This can increase your chance of falling. Ask your doctor what other things that you can do to help prevent falls.   This information is not intended to replace advice given to you by your health care provider. Make sure you discuss any questions you have with your health care provider.   Document Released: 08/29/2009 Document Revised: 03/19/2015 Document Reviewed: 12/07/2014 Elsevier Interactive Patient Education 2016 [place] Can Quit Smoking If you are ready to quit smoking or are thinking about it, congratulations! You have chosen to help yourself be healthier and live longer! There are lots of different ways to quit smoking. Nicotine gum, nicotine patches, a nicotine inhaler, or nicotine nasal spray can help with physical craving. Hypnosis, support groups, and medicines help break the habit of smoking. TIPS TO GET OFF AND STAY OFF CIGARETTES  Learn to predict your moods. Do not let a bad situation be your excuse to have a cigarette. Some situations in your life might tempt you to have a cigarette.  Ask friends and co-workers not to smoke around you.  Make your home smoke-free.  Never have "just one" cigarette. It leads to  wanting another and another. Remind yourself of your decision to quit.  On a card, make a list of your reasons for not smoking. Read it at least the same number of times a day as you have a cigarette. Tell yourself everyday, "I do not want to smoke. I choose not to smoke."  Ask someone at home or work to help you with your plan to quit smoking.  Have something planned after you eat or have a cup of coffee. Take a walk or get other exercise to perk you up. This will help to keep you from overeating.  Try a relaxation exercise to calm you down and decrease your stress. Remember, you may be tense and nervous the first two weeks after you quit. This will pass.  Find new activities to keep your hands busy. Play with a pen, coin, or rubber band. Doodle or draw things on paper.  Brush your teeth right after eating. This will help cut down the craving for the taste of tobacco after meals. You can try mouthwash too.  Try gum, breath mints, or diet candy to keep something in your mouth. IF YOU SMOKE AND WANT TO QUIT:  Do not stock up on cigarettes. Never buy a carton. Wait until one pack is finished before you buy another.  Never carry cigarettes with you at work or at home.  Keep cigarettes as far away from you as possible. Leave them with someone else.  Never carry matches or a lighter with you.  Ask yourself, "Do I need this cigarette or is this just a reflex?"  Bet with someone that you can quit. Put cigarette money in a piggy bank every morning. If you smoke, you give up the money. If you do not smoke, by the end of the week, you keep the money.  Keep trying. It takes 21 days to change a habit!  Talk to your doctor about using medicines to help you quit. These include nicotine replacement gum, lozenges, or skin patches.   This information is not intended to replace advice given to you by your health care provider. Make sure you discuss any questions you have with your health care  provider.   Document Released: 08/29/2009 Document Revised: 01/25/2012 Document Reviewed: 08/29/2009 Elsevier Interactive Patient Education [employer].

## 2016-06-28 ENCOUNTER — Encounter: Payer: Self-pay | Admitting: Family Medicine

## 2016-06-28 DIAGNOSIS — R222 Localized swelling, mass and lump, trunk: Secondary | ICD-10-CM | POA: Insufficient documentation

## 2016-06-28 NOTE — Assessment & Plan Note (Signed)

## 2016-06-28 NOTE — Assessment & Plan Note (Signed)
Painful nodule on left buttock, enlarging, will refer for [place]

## 2016-06-28 NOTE — Assessment & Plan Note (Signed)

## 2016-06-29 ENCOUNTER — Ambulatory Visit (HOSPITAL_COMMUNITY)
Admission: RE | Admit: 2016-06-29 | Discharge: 2016-06-29 | Disposition: A | Discharge status: Home / Self Care | Payer: Medicare Other | Source: Ambulatory Visit | Attending: Family Medicine | Admitting: Family Medicine

## 2016-06-29 DIAGNOSIS — R229 Localized swelling, mass and lump, unspecified: Secondary | ICD-10-CM | POA: Diagnosis not present

## 2016-06-29 DIAGNOSIS — R1030 Lower abdominal pain, unspecified: Secondary | ICD-10-CM | POA: Diagnosis not present

## 2016-06-29 DIAGNOSIS — R222 Localized swelling, mass and lump, trunk: Secondary | ICD-10-CM

## 2016-07-11 ENCOUNTER — Other Ambulatory Visit: Payer: Self-pay | Admitting: Family Medicine

## 2016-07-13 ENCOUNTER — Encounter: Payer: Self-pay | Admitting: Orthopedic Surgery

## 2016-07-13 ENCOUNTER — Ambulatory Visit (INDEPENDENT_AMBULATORY_CARE_PROVIDER_SITE_OTHER): Payer: Medicare Other | Admitting: Orthopedic Surgery

## 2016-07-13 VITALS — BP 159/79 | HR 64 | Ht 63.0 in | Wt 197.0 lb

## 2016-07-13 DIAGNOSIS — I25119 Atherosclerotic heart disease of native coronary artery with unspecified angina pectoris: Secondary | ICD-10-CM | POA: Diagnosis not present

## 2016-07-13 DIAGNOSIS — G5602 Carpal tunnel syndrome, left upper limb: Secondary | ICD-10-CM | POA: Diagnosis not present

## 2016-07-13 DIAGNOSIS — G5603 Carpal tunnel syndrome, bilateral upper limbs: Secondary | ICD-10-CM | POA: Diagnosis not present

## 2016-07-13 NOTE — Progress Notes (Signed)
Patient ID: Tammy Suarez, female   DOB: 1946-10-30, 70 y.o.   MRN: [id number]  Chief Complaint  Patient presents with  . Follow-up    bilateral carpal tunnel    HPI Tammy Suarez is a 70 y.o. female.  Presents for reevaluation of bilateral carpal tunnel syndrome initially left was worse than right knee are both equally symptomatic  Pain and paresthesias both hands dropping things failed conservative care wishes to proceed with carpal tunnel release  Other symptoms include night pain distal wrist pain median nerve distribution numbness and tingling weakness in both hands  Review of Systems Review of Systems  Constitutional: Negative for fever.  Respiratory: Negative.   Cardiovascular: Negative.   Musculoskeletal: Positive for arthralgias.  Neurological: Positive for weakness and numbness.    Past Medical History:  Diagnosis Date  . Carpal tunnel syndrome    bilaterally  . Chronic back pain   . CKD (chronic kidney disease) stage 3, GFR 30-59 ml/min   . Coronary atherosclerosis of native coronary artery    Multivessel status post CABG - LIMA to LAD, SVG to OM and SVG to PDA  . Essential hypertension, benign   . GERD (gastroesophageal reflux disease)   . History of conjunctivitis   . Hyperlipidemia   . Obesity   . Type 2 diabetes mellitus without complications ([place])   . Vitamin B 12 deficiency     Past Surgical History:  Procedure Laterality Date  . ABDOMINAL HYSTERECTOMY    . BACK SURGERY    . CARDIAC SURGERY    . CERVICAL LAMINECTOMY    . CHOLECYSTECTOMY    . CORONARY ARTERY BYPASS GRAFT N/A 11/02/2013   Procedure: CORONARY ARTERY BYPASS GRAFTING (CABG);  Surgeon: Gaye Pollack, MD;  Location: [place];  Service: Open Heart Surgery;  Laterality: N/A;  CABG x three, using left internal mammary artery and right leg greater saphenous vein harvested endoscopically  . COSMETIC SURGERY  07/01/2012   Recurrent skin infection on lower abdomen, [facility]  . HERNIA REPAIR    .  Inguinal herniorrhaphy    . INTRAOPERATIVE TRANSESOPHAGEAL ECHOCARDIOGRAM N/A 11/02/2013   Procedure: INTRAOPERATIVE TRANSESOPHAGEAL ECHOCARDIOGRAM;  Surgeon: Gaye Pollack, MD;  Location: [facility] OR;  Service: Open Heart Surgery;  Laterality: N/A;  . LAPAROSCOPIC GASTRIC BANDING  2009  . LEFT HEART CATHETERIZATION WITH CORONARY ANGIOGRAM N/A 10/30/2013   Procedure: LEFT HEART CATHETERIZATION WITH CORONARY ANGIOGRAM;  Surgeon: Minus Breeding, MD;  Location: [facility] CATH LAB;  Service: Cardiovascular;  Laterality: N/A;  . Left index finger repair    . LUMBAR FUSION    . SPINE SURGERY    . VESICOVAGINAL FISTULA CLOSURE W/ TAH      Social History Social History  Substance Use Topics  . Smoking status: Current Some Day Smoker    Packs/day: 1.50    Years: 25.00    Types: Cigarettes  . Smokeless tobacco: Former Systems developer    Quit date: 11/14/2014     Comment: quit 2 months ago   . Alcohol use No    Allergies  Allergen Reactions  . Gabapentin Other (See Comments)    Feels dizzy  . Meperidine Hcl Nausea And Vomiting  . Niacin Other (See Comments)    REACTION: face peeling and burning  . Propoxyphene N-Acetaminophen Nausea And Vomiting    Current Outpatient Prescriptions  Medication Sig Dispense Refill  . Aspirin-Acetaminophen-Caffeine (GOODY HEADACHE PO) Take 1 Package by mouth 2 (two) times daily as needed (headache).    Marland Kitchen buPROPion (  WELLBUTRIN XL) 300 MG 24 hr tablet TAKE 1 TABLET BY MOUTH EVERY MORNING 90 tablet 1  . clonazePAM (KLONOPIN) 1 MG tablet TAKE 1 TABLET BY MOUTH 3 TIMES A DAY 90 tablet 2  . DULoxetine (CYMBALTA) 60 MG capsule Take 1 capsule (60 mg total) by mouth 2 (two) times daily. 180 capsule 1  . fenofibrate (TRICOR) 145 MG tablet TAKE 1 TABLET BY MOUTH EVERYDAY 30 tablet 5  . FLUoxetine (PROZAC) 20 MG capsule Take 20 mg by mouth daily.     . furosemide (LASIX) 20 MG tablet Take daily as needed for leg swelling (Patient taking differently: Take 20 mg by mouth daily as needed for  fluid. Take daily as needed for leg swelling) 30 tablet 6  . levothyroxine (SYNTHROID, LEVOTHROID) 100 MCG tablet TAKE 1 TABLET BY MOUTH EVERY DAY *DOSE CHANGE* (Patient taking differently: Take 100 mcg by mouth daily before breakfast. ) 30 tablet 2  . loratadine (CLARITIN) 10 MG tablet Take 1 tablet (10 mg total) by mouth daily as needed for allergies. 30 tablet 6  . metoprolol succinate (TOPROL-XL) 25 MG 24 hr tablet TAKE 1 TABLET BY MOUTH DAILY 90 tablet 0  . Multiple Vitamin (MULTIVITAMIN) tablet Take 1 tablet by mouth daily.    . nitroGLYCERIN (NITROSTAT) 0.4 MG SL tablet PLACE 1 TABLET UNDER THE TONGUE EVERY 5 MINUTES AS NEEDED FOR CHEST PAIN 25 tablet 1  . Omega-3 Fatty Acids (FISH OIL PO) Take 1 capsule by mouth daily.    Marland Kitchen oxybutynin (DITROPAN-XL) 10 MG 24 hr tablet TAKE 1 TABLET BY MOUTH AT BEDTIME 30 tablet 4  . ranitidine (ZANTAC) 150 MG tablet Take 1 tablet (150 mg total) by mouth 2 (two) times daily. 180 tablet 1  . simvastatin (ZOCOR) 40 MG tablet TAKE 1 TABLET BY MOUTH AT BEDTIME 90 tablet 1   No current facility-administered medications for this visit.       Physical Exam Physical Exam BP (!) 159/79   Pulse 64   Ht 5\' 3"  (1.6 m)   Wt 197 lb (89.4 kg)   BMI 34.90 kg/m   Gen. appearance normal The patient is alert and oriented person place and time Mood is normal affect is normal Ambulatory status normal   Exam of the left wrist and hand color is normal. Appearance is normal. Full range of motion. Wrists stable. Weakness to grip and power grip. Tenderness over the carpal tunnel. 2+ radial pulse normal capillary refill epitrochlear lymph nodes normal decreased sensation median nerve pathologic reflexes   We find similar findings on the right with tenderness over the carpal tunnel. Range of motion normal. No instability. Weakness in grip. Skin normal. Pulses and color normal. Sensation decreased in the median nerve distribution.  Assessment    Bilateral carpal tunnel  syndrome  Left carpal tunnel syndrome      Plan    Left carpal tunnel release        Arther Abbott 07/13/2016, 1:47 PM

## 2016-07-13 NOTE — Patient Instructions (Addendum)
SURGERY 07/23/16  Carpal Tunnel Release Carpal tunnel release is a surgical procedure to relieve numbness and pain in your hand that are caused by carpal tunnel syndrome. Your carpal tunnel is a narrow, hollow space in your wrist. It passes between your wrist bones and a band of connective tissue (transverse carpal ligament). The nerve that supplies most of your hand (median nerve) passes through this space, and so do the connections between your fingers and the muscles of your arm (tendons). Carpal tunnel syndrome makes this space swell and become narrow, and this causes pain and numbness. In carpal tunnel release surgery, a surgeon cuts through the transverse carpal ligament to make more room in the carpal tunnel space. You may have this surgery if other types of treatment have not worked. LET [facility] CARE PROVIDER KNOW ABOUT:  Any allergies you have.  All medicines you are taking, including vitamins, herbs, eye drops, creams, and over-the-counter medicines.  Previous problems you or members of your family have had with the use of anesthetics.  Any blood disorders you have.  Previous surgeries you have had.  Medical conditions you have. RISKS AND COMPLICATIONS Generally, this is a safe procedure. However, problems may occur, including:  Bleeding.  Infection.  Injury to the median nerve.  Need for additional surgery. BEFORE THE PROCEDURE  Ask your health care provider about:  Changing or stopping your regular medicines. This is especially important if you are taking diabetes medicines or blood thinners.  Taking medicines such as aspirin and ibuprofen. These medicines can thin your blood. Do not take these medicines before your procedure if your health care provider instructs you not to.  Do not eat or drink anything after midnight on the night before the procedure or as directed by your health care provider.  Plan to have someone take you home after the  procedure. PROCEDURE  An IV tube may be inserted into a vein.  You will be given one of the following:  A medicine that numbs the wrist area (local anesthetic). You may also be given a medicine to make you relax (sedative).  A medicine that makes you go to sleep (general anesthetic).  Your arm, hand, and wrist will be cleaned with a germ-killing solution (antiseptic).  Your surgeon will make a surgical cut (incision) over the palm side of your wrist. The surgeon will pull aside the skin of your wrist to expose the carpal tunnel space.  The surgeon will cut the transverse carpal ligament.  The edges of the incision will be closed with stitches (sutures) or staples.  A bandage (dressing) will be placed over your wrist and wrapped around your hand and wrist. AFTER THE PROCEDURE  You may spend some time in a recovery area.  Your blood pressure, heart rate, breathing rate, and blood oxygen level will be monitored often until the medicines you were given have worn off.  You will likely have some pain. You will be given pain medicine.  You may need to wear a splint or a wrist brace over your dressing.   This information is not intended to replace advice given to you by your health care provider. Make sure you discuss any questions you have with your health care provider.   Document Released: 01/23/2004 Document Revised: 11/23/2014 Document Reviewed: 06/20/2014 Elsevier Interactive Patient Education [employer].

## 2016-07-15 NOTE — Patient Instructions (Signed)
Tammy Suarez  07/15/2016     @PREFPERIOPPHARMACY @   Your procedure is scheduled on 07/23/2016.  Report to [facility] at 11:00 A.M.  Call this number if you have problems the morning of surgery:  [phone]   Remember:  Do not eat food or drink liquids after midnight.  Take these medicines the morning of surgery with A SIP OF WATER : WELLBUTRIN, KLONOPIN, CYMBALTA, PROZAC, SYNTHROID, CLARITIN, TOPROL AND ZANTAC   Do not wear jewelry, make-up or nail polish.  Do not wear lotions, powders, or perfumes, or deoderant.  Do not shave 48 hours prior to surgery.  Men may shave face and neck.  Do not bring valuables to the hospital.  [facility] is not responsible for any belongings or valuables.  Contacts, dentures or bridgework may not be worn into surgery.  Leave your suitcase in the car.  After surgery it may be brought to your room.  For patients admitted to the hospital, discharge time will be determined by your treatment team.  Patients discharged the day of surgery will not be allowed to drive home.   Name and phone number of your driver:   FAMILY Special instructions:  N/A  Please read over the following fact sheets that you were given. Care and Recovery After Surgery    Carpal Tunnel Release Carpal tunnel release is a surgical procedure to relieve numbness and pain in your hand that are caused by carpal tunnel syndrome. Your carpal tunnel is a narrow, hollow space in your wrist. It passes between your wrist bones and a band of connective tissue (transverse carpal ligament). The nerve that supplies most of your hand (median nerve) passes through this space, and so do the connections between your fingers and the muscles of your arm (tendons). Carpal tunnel syndrome makes this space swell and become narrow, and this causes pain and numbness. In carpal tunnel release surgery, a surgeon cuts through the transverse carpal ligament to make more room in the carpal tunnel space. You  may have this surgery if other types of treatment have not worked. LET [facility] CARE PROVIDER KNOW ABOUT:  Any allergies you have.  All medicines you are taking, including vitamins, herbs, eye drops, creams, and over-the-counter medicines.  Previous problems you or members of your family have had with the use of anesthetics.  Any blood disorders you have.  Previous surgeries you have had.  Medical conditions you have. RISKS AND COMPLICATIONS Generally, this is a safe procedure. However, problems may occur, including:  Bleeding.  Infection.  Injury to the median nerve.  Need for additional surgery. BEFORE THE PROCEDURE  Ask your health care provider about:  Changing or stopping your regular medicines. This is especially important if you are taking diabetes medicines or blood thinners.  Taking medicines such as aspirin and ibuprofen. These medicines can thin your blood. Do not take these medicines before your procedure if your health care provider instructs you not to.  Do not eat or drink anything after midnight on the night before the procedure or as directed by your health care provider.  Plan to have someone take you home after the procedure. PROCEDURE  An IV tube may be inserted into a vein.  You will be given one of the following:  A medicine that numbs the wrist area (local anesthetic). You may also be given a medicine to make you relax (sedative).  A medicine that makes you go to sleep (general anesthetic).  Your arm, hand,  and wrist will be cleaned with a germ-killing solution (antiseptic).  Your surgeon will make a surgical cut (incision) over the palm side of your wrist. The surgeon will pull aside the skin of your wrist to expose the carpal tunnel space.  The surgeon will cut the transverse carpal ligament.  The edges of the incision will be closed with stitches (sutures) or staples.  A bandage (dressing) will be placed over your wrist and wrapped  around your hand and wrist. AFTER THE PROCEDURE  You may spend some time in a recovery area.  Your blood pressure, heart rate, breathing rate, and blood oxygen level will be monitored often until the medicines you were given have worn off.  You will likely have some pain. You will be given pain medicine.  You may need to wear a splint or a wrist brace over your dressing.   This information is not intended to replace advice given to you by your health care provider. Make sure you discuss any questions you have with your health care provider.   Document Released: 01/23/2004 Document Revised: 11/23/2014 Document Reviewed: 06/20/2014 Elsevier Interactive Patient Education [employer].

## 2016-07-16 ENCOUNTER — Other Ambulatory Visit: Payer: Self-pay | Admitting: Family Medicine

## 2016-07-17 ENCOUNTER — Encounter (HOSPITAL_COMMUNITY): Payer: Self-pay

## 2016-07-17 ENCOUNTER — Encounter (HOSPITAL_COMMUNITY)
Admission: RE | Admit: 2016-07-17 | Discharge: 2016-07-17 | Disposition: A | Discharge status: Home / Self Care | Payer: Medicare Other | Source: Ambulatory Visit | Attending: Orthopedic Surgery | Admitting: Orthopedic Surgery

## 2016-07-17 ENCOUNTER — Other Ambulatory Visit: Payer: Self-pay

## 2016-07-17 DIAGNOSIS — I10 Essential (primary) hypertension: Secondary | ICD-10-CM | POA: Insufficient documentation

## 2016-07-17 DIAGNOSIS — E785 Hyperlipidemia, unspecified: Secondary | ICD-10-CM | POA: Diagnosis not present

## 2016-07-17 DIAGNOSIS — Z01812 Encounter for preprocedural laboratory examination: Secondary | ICD-10-CM | POA: Diagnosis not present

## 2016-07-17 DIAGNOSIS — I2510 Atherosclerotic heart disease of native coronary artery without angina pectoris: Secondary | ICD-10-CM | POA: Insufficient documentation

## 2016-07-17 HISTORY — DX: Unspecified osteoarthritis, unspecified site: M19.90

## 2016-07-17 HISTORY — DX: Headache, unspecified: R51.9

## 2016-07-17 HISTORY — DX: Personal history of other diseases of the digestive system: Z87.19

## 2016-07-17 HISTORY — DX: Depression, unspecified: F32.A

## 2016-07-17 HISTORY — DX: Acute myocardial infarction, unspecified: I21.9

## 2016-07-17 HISTORY — DX: Headache: R51

## 2016-07-17 HISTORY — DX: Hypothyroidism, unspecified: E03.9

## 2016-07-17 HISTORY — DX: Major depressive disorder, single episode, unspecified: F32.9

## 2016-07-17 LAB — BASIC METABOLIC PANEL
ANION GAP: 9 (ref 5–15)
BUN: 33 mg/dL — ABNORMAL HIGH (ref 6–20)
CO2: 27 mmol/L (ref 22–32)
Calcium: 9.6 mg/dL (ref 8.9–10.3)
Chloride: 101 mmol/L (ref 101–111)
Creatinine, Ser: 1.47 mg/dL — ABNORMAL HIGH (ref 0.44–1.00)
GFR calc Af Amer: 41 mL/min — ABNORMAL LOW (ref >60)
GFR, EST NON AFRICAN AMERICAN: 35 mL/min — AB (ref >60)
Glucose, Bld: 132 mg/dL — ABNORMAL HIGH (ref 65–99)
POTASSIUM: 4.1 mmol/L (ref 3.5–5.1)
SODIUM: 137 mmol/L (ref 135–145)

## 2016-07-17 LAB — CBC
HCT: 37.4 % (ref 36.0–46.0)
Hemoglobin: 12.5 g/dL (ref 12.0–15.0)
MCH: 31.6 pg (ref 26.0–34.0)
MCHC: 33.4 g/dL (ref 30.0–36.0)
MCV: 94.4 fL (ref 78.0–100.0)
PLATELETS: 276 10*3/uL (ref 150–400)
RBC: 3.96 MIL/uL (ref 3.87–5.11)
RDW: 12.9 % (ref 11.5–15.5)
WBC: 5.6 10*3/uL (ref 4.0–10.5)

## 2016-07-21 ENCOUNTER — Other Ambulatory Visit: Payer: Self-pay | Admitting: Family Medicine

## 2016-07-22 ENCOUNTER — Other Ambulatory Visit: Payer: Self-pay

## 2016-07-22 NOTE — H&P (Signed)
Patient ID: Tammy Suarez, female   DOB: 19-Apr-1946, 70 y.o.   MRN: [id number]                 Chief complaint pain paresthesias left upper extremity  HPI Tammy Suarez is a 70 y.o. female.  Presents for reevaluation of bilateral carpal tunnel syndrome initially left was worse than right knee are both equally symptomatic   Pain and paresthesias both hands dropping things failed conservative care wishes to proceed with carpal tunnel release   Other symptoms include night pain distal wrist pain median nerve distribution numbness and tingling weakness in both hands   Review of Systems Review of Systems  Constitutional: Negative for fever.  Respiratory: Negative.   Cardiovascular: Negative.   Musculoskeletal: Positive for arthralgias.  Neurological: Positive for weakness and numbness.          Past Medical History:  Diagnosis Date  . Carpal tunnel syndrome      bilaterally  . Chronic back pain    . CKD (chronic kidney disease) stage 3, GFR 30-59 ml/min    . Coronary atherosclerosis of native coronary artery      Multivessel status post CABG - LIMA to LAD, SVG to OM and SVG to PDA  . Essential hypertension, benign    . GERD (gastroesophageal reflux disease)    . History of conjunctivitis    . Hyperlipidemia    . Obesity    . Type 2 diabetes mellitus without complications ([place])    . Vitamin B 12 deficiency             Past Surgical History:  Procedure Laterality Date  . ABDOMINAL HYSTERECTOMY      . BACK SURGERY      . CARDIAC SURGERY      . CERVICAL LAMINECTOMY      . CHOLECYSTECTOMY      . CORONARY ARTERY BYPASS GRAFT N/A 11/02/2013    Procedure: CORONARY ARTERY BYPASS GRAFTING (CABG);  Surgeon: Gaye Pollack, MD;  Location: [facility];  Service: Open Heart Surgery;  Laterality: N/A;  CABG x three, using left internal mammary artery and right leg greater saphenous vein harvested endoscopically  . COSMETIC SURGERY   07/01/2012    Recurrent skin infection on lower  abdomen, [facility]  . HERNIA REPAIR      . Inguinal herniorrhaphy      . INTRAOPERATIVE TRANSESOPHAGEAL ECHOCARDIOGRAM N/A 11/02/2013    Procedure: INTRAOPERATIVE TRANSESOPHAGEAL ECHOCARDIOGRAM;  Surgeon: Gaye Pollack, MD;  Location: [facility] OR;  Service: Open Heart Surgery;  Laterality: N/A;  . LAPAROSCOPIC GASTRIC BANDING   2009  . LEFT HEART CATHETERIZATION WITH CORONARY ANGIOGRAM N/A 10/30/2013    Procedure: LEFT HEART CATHETERIZATION WITH CORONARY ANGIOGRAM;  Surgeon: Minus Breeding, MD;  Location: [facility] C[facility] LAB;  Service: Cardiovascular;  Laterality: N/A;  . Left index finger repair      . LUMBAR FUSION      . SPINE SURGERY      . VESICOVAGINAL FISTULA CLOSURE W/ TAH          Social History       Social History  Substance Use Topics  . Smoking status: Current Some Day Smoker      Packs/day: 1.50      Years: 25.00      Types: Cigarettes  . Smokeless tobacco: Former Systems developer      Quit date: 11/14/2014        Comment: quit 2 months ago   . Alcohol use  No           Allergies  Allergen Reactions  . Gabapentin Other (See Comments)      Feels dizzy  . Meperidine Hcl Nausea And Vomiting  . Niacin Other (See Comments)      REACTION: face peeling and burning  . Propoxyphene N-Acetaminophen Nausea And Vomiting            Current Outpatient Prescriptions  Medication Sig Dispense Refill  . Aspirin-Acetaminophen-Caffeine (GOODY HEADACHE PO) Take 1 Package by mouth 2 (two) times daily as needed (headache).      Marland Kitchen buPROPion (WELLBUTRIN XL) 300 MG 24 hr tablet TAKE 1 TABLET BY MOUTH EVERY MORNING 90 tablet 1  . clonazePAM (KLONOPIN) 1 MG tablet TAKE 1 TABLET BY MOUTH 3 TIMES A DAY 90 tablet 2  . DULoxetine (CYMBALTA) 60 MG capsule Take 1 capsule (60 mg total) by mouth 2 (two) times daily. 180 capsule 1  . fenofibrate (TRICOR) 145 MG tablet TAKE 1 TABLET BY MOUTH EVERYDAY 30 tablet 5  . FLUoxetine (PROZAC) 20 MG capsule Take 20 mg by mouth daily.       . furosemide (LASIX) 20 MG tablet Take  daily as needed for leg swelling (Patient taking differently: Take 20 mg by mouth daily as needed for fluid. Take daily as needed for leg swelling) 30 tablet 6  . levothyroxine (SYNTHROID, LEVOTHROID) 100 MCG tablet TAKE 1 TABLET BY MOUTH EVERY DAY *DOSE CHANGE* (Patient taking differently: Take 100 mcg by mouth daily before breakfast. ) 30 tablet 2  . loratadine (CLARITIN) 10 MG tablet Take 1 tablet (10 mg total) by mouth daily as needed for allergies. 30 tablet 6  . metoprolol succinate (TOPROL-XL) 25 MG 24 hr tablet TAKE 1 TABLET BY MOUTH DAILY 90 tablet 0  . Multiple Vitamin (MULTIVITAMIN) tablet Take 1 tablet by mouth daily.      . nitroGLYCERIN (NITROSTAT) 0.4 MG SL tablet PLACE 1 TABLET UNDER THE TONGUE EVERY 5 MINUTES AS NEEDED FOR CHEST PAIN 25 tablet 1  . Omega-3 Fatty Acids (FISH OIL PO) Take 1 capsule by mouth daily.      Marland Kitchen oxybutynin (DITROPAN-XL) 10 MG 24 hr tablet TAKE 1 TABLET BY MOUTH AT BEDTIME 30 tablet 4  . ranitidine (ZANTAC) 150 MG tablet Take 1 tablet (150 mg total) by mouth 2 (two) times daily. 180 tablet 1  . simvastatin (ZOCOR) 40 MG tablet TAKE 1 TABLET BY MOUTH AT BEDTIME 90 tablet 1    No current facility-administered medications for this visit.           Physical Exam Physical Exam BP (!) 159/79   Pulse 64   Ht 5\' 3"  (1.6 m)   Wt 197 lb (89.4 kg)   BMI 34.90 kg/m    Gen. appearance normal The patient is alert and oriented person place and time Mood is normal affect is normal Ambulatory status normal    Exam of the left wrist and hand color is normal. Appearance is normal. Full range of motion. Wrists stable. Weakness to grip and power grip. Tenderness over the carpal tunnel. 2+ radial pulse normal capillary refill epitrochlear lymph nodes normal decreased sensation median nerve pathologic reflexes     We find similar findings on the right with tenderness over the carpal tunnel. Range of motion normal. No instability. Weakness in grip. Skin normal. Pulses  and color normal. Sensation decreased in the median nerve distribution.   Assessment       Left carpal tunnel syndrome  Plan    Left carpal tunnel release           Arther Abbott

## 2016-07-23 ENCOUNTER — Ambulatory Visit (HOSPITAL_COMMUNITY)
Admission: RE | Admit: 2016-07-23 | Discharge: 2016-07-23 | Disposition: A | Discharge status: Home / Self Care | Payer: Medicare Other | Source: Ambulatory Visit | Attending: Orthopedic Surgery | Admitting: Orthopedic Surgery

## 2016-07-23 ENCOUNTER — Encounter (HOSPITAL_COMMUNITY): Payer: Self-pay | Admitting: *Deleted

## 2016-07-23 ENCOUNTER — Encounter (HOSPITAL_COMMUNITY)
Admission: RE | Disposition: A | Discharge status: Home / Self Care | Payer: Self-pay | Source: Ambulatory Visit | Attending: Orthopedic Surgery

## 2016-07-23 ENCOUNTER — Ambulatory Visit (HOSPITAL_COMMUNITY): Payer: Medicare Other | Admitting: Anesthesiology

## 2016-07-23 DIAGNOSIS — G5602 Carpal tunnel syndrome, left upper limb: Secondary | ICD-10-CM

## 2016-07-23 DIAGNOSIS — E785 Hyperlipidemia, unspecified: Secondary | ICD-10-CM | POA: Diagnosis not present

## 2016-07-23 DIAGNOSIS — Z6835 Body mass index (BMI) 35.0-35.9, adult: Secondary | ICD-10-CM | POA: Insufficient documentation

## 2016-07-23 DIAGNOSIS — Z951 Presence of aortocoronary bypass graft: Secondary | ICD-10-CM | POA: Diagnosis not present

## 2016-07-23 DIAGNOSIS — I2510 Atherosclerotic heart disease of native coronary artery without angina pectoris: Secondary | ICD-10-CM | POA: Insufficient documentation

## 2016-07-23 DIAGNOSIS — Z79899 Other long term (current) drug therapy: Secondary | ICD-10-CM | POA: Insufficient documentation

## 2016-07-23 DIAGNOSIS — I129 Hypertensive chronic kidney disease with stage 1 through stage 4 chronic kidney disease, or unspecified chronic kidney disease: Secondary | ICD-10-CM | POA: Diagnosis not present

## 2016-07-23 DIAGNOSIS — I252 Old myocardial infarction: Secondary | ICD-10-CM | POA: Diagnosis not present

## 2016-07-23 DIAGNOSIS — K219 Gastro-esophageal reflux disease without esophagitis: Secondary | ICD-10-CM | POA: Insufficient documentation

## 2016-07-23 DIAGNOSIS — F17210 Nicotine dependence, cigarettes, uncomplicated: Secondary | ICD-10-CM | POA: Insufficient documentation

## 2016-07-23 DIAGNOSIS — E1122 Type 2 diabetes mellitus with diabetic chronic kidney disease: Secondary | ICD-10-CM | POA: Diagnosis not present

## 2016-07-23 DIAGNOSIS — N183 Chronic kidney disease, stage 3 (moderate): Secondary | ICD-10-CM | POA: Insufficient documentation

## 2016-07-23 DIAGNOSIS — M79642 Pain in left hand: Secondary | ICD-10-CM | POA: Diagnosis present

## 2016-07-23 HISTORY — PX: CARPAL TUNNEL RELEASE: SHX101

## 2016-07-23 LAB — GLUCOSE, CAPILLARY
GLUCOSE-CAPILLARY: 148 mg/dL — AB (ref 65–99)
GLUCOSE-CAPILLARY: 124 mg/dL — AB (ref 65–99)

## 2016-07-23 SURGERY — CARPAL TUNNEL RELEASE
Anesthesia: Regional | Site: Hand | Laterality: Left

## 2016-07-23 MED ORDER — FENTANYL CITRATE (PF) 100 MCG/2ML IJ SOLN
25.0000 ug | INTRAMUSCULAR | Status: DC | PRN
  Administered 2016-07-23: 25 ug via INTRAVENOUS

## 2016-07-23 MED ORDER — PROPOFOL 10 MG/ML IV BOLUS
INTRAVENOUS | Status: AC
  Filled 2016-07-23: qty 20

## 2016-07-23 MED ORDER — PROPOFOL 500 MG/50ML IV EMUL
INTRAVENOUS | Status: DC | PRN
  Administered 2016-07-23: 30 ug/kg/min via INTRAVENOUS

## 2016-07-23 MED ORDER — FENTANYL CITRATE (PF) 100 MCG/2ML IJ SOLN
INTRAMUSCULAR | Status: DC | PRN
  Administered 2016-07-23: 50 ug via INTRAVENOUS

## 2016-07-23 MED ORDER — CEFAZOLIN SODIUM-DEXTROSE 2-4 GM/100ML-% IV SOLN
2.0000 g | INTRAVENOUS | Status: AC
  Administered 2016-07-23: 2 g via INTRAVENOUS
  Filled 2016-07-23: qty 100

## 2016-07-23 MED ORDER — LACTATED RINGERS IV SOLN
INTRAVENOUS | Status: DC
  Administered 2016-07-23: 12:00:00 via INTRAVENOUS

## 2016-07-23 MED ORDER — SUGAMMADEX SODIUM 500 MG/5ML IV SOLN
INTRAVENOUS | Status: AC
  Filled 2016-07-23: qty 5

## 2016-07-23 MED ORDER — CHLORHEXIDINE GLUCONATE 4 % EX LIQD: 60.0000 mL | Freq: Once | CUTANEOUS | Status: DC

## 2016-07-23 MED ORDER — PROPOFOL 10 MG/ML IV BOLUS
INTRAVENOUS | Status: DC | PRN
  Administered 2016-07-23: 10 mg via INTRAVENOUS

## 2016-07-23 MED ORDER — MIDAZOLAM HCL 2 MG/2ML IJ SOLN
INTRAMUSCULAR | Status: AC
  Filled 2016-07-23: qty 2

## 2016-07-23 MED ORDER — MIDAZOLAM HCL 2 MG/2ML IJ SOLN
1.0000 mg | INTRAMUSCULAR | Status: DC | PRN
  Administered 2016-07-23: 2 mg via INTRAVENOUS

## 2016-07-23 MED ORDER — FENTANYL CITRATE (PF) 100 MCG/2ML IJ SOLN
INTRAMUSCULAR | Status: AC
  Filled 2016-07-23: qty 2

## 2016-07-23 MED ORDER — BUPIVACAINE HCL (PF) 0.5 % IJ SOLN
INTRAMUSCULAR | Status: AC
  Filled 2016-07-23: qty 30

## 2016-07-23 MED ORDER — 0.9 % SODIUM CHLORIDE (POUR BTL) OPTIME
TOPICAL | Status: DC | PRN
  Administered 2016-07-23: 500 mL

## 2016-07-23 MED ORDER — HYDROMORPHONE HCL 1 MG/ML IJ SOLN: 0.2500 mg | INTRAMUSCULAR | Status: DC | PRN

## 2016-07-23 MED ORDER — BUPIVACAINE HCL (PF) 0.5 % IJ SOLN
INTRAMUSCULAR | Status: DC | PRN
  Administered 2016-07-23: 10 mL

## 2016-07-23 MED ORDER — LIDOCAINE HCL (PF) 0.5 % IJ SOLN
INTRAMUSCULAR | Status: DC | PRN
  Administered 2016-07-23: 50 mL via INTRAVENOUS

## 2016-07-23 MED ORDER — LIDOCAINE HCL 1 % IJ SOLN
INTRAMUSCULAR | Status: DC | PRN
  Administered 2016-07-23: 10 mg via INTRADERMAL

## 2016-07-23 MED ORDER — HYDROCODONE-ACETAMINOPHEN 5-325 MG PO TABS: 1.0000 | ORAL_TABLET | ORAL | 0 refills | Status: DC | PRN

## 2016-07-23 SURGICAL SUPPLY — 43 items
BAG HAMPER (MISCELLANEOUS) ×2 IMPLANT
BANDAGE ELASTIC 3 LF NS (GAUZE/BANDAGES/DRESSINGS) ×2 IMPLANT
BANDAGE ELASTIC 4 LF NS (GAUZE/BANDAGES/DRESSINGS) ×1 IMPLANT
BANDAGE ESMARK 4X12 BL STRL LF (DISPOSABLE) ×1 IMPLANT
BLADE SURG 15 STRL LF DISP TIS (BLADE) ×1 IMPLANT
BLADE SURG 15 STRL SS (BLADE) ×2
BNDG CMPR 12X4 ELC STRL LF (DISPOSABLE) ×1
BNDG CMPR MED 5X3 ELC HKLP NS (GAUZE/BANDAGES/DRESSINGS) ×1
BNDG CMPR MED 5X4 ELC HKLP NS (GAUZE/BANDAGES/DRESSINGS) ×1
BNDG COHESIVE 4X5 TAN STRL (GAUZE/BANDAGES/DRESSINGS) ×2 IMPLANT
BNDG ESMARK 4X12 BLUE STRL LF (DISPOSABLE) ×2
BNDG GAUZE ELAST 4 BULKY (GAUZE/BANDAGES/DRESSINGS) ×1 IMPLANT
CHLORAPREP W/TINT 26ML (MISCELLANEOUS) ×2 IMPLANT
CLOTH BEACON ORANGE TIMEOUT ST (SAFETY) ×2 IMPLANT
COVER LIGHT HANDLE STERIS (MISCELLANEOUS) ×4 IMPLANT
CUFF TOURNIQUET SINGLE 18IN (TOURNIQUET CUFF) ×2 IMPLANT
DECANTER SPIKE VIAL GLASS SM (MISCELLANEOUS) ×2 IMPLANT
DRAPE PROXIMA HALF (DRAPES) ×2 IMPLANT
DRSG XEROFORM 1X8 (GAUZE/BANDAGES/DRESSINGS) ×1 IMPLANT
ELECT NDL TIP 2.8 STRL (NEEDLE) ×1 IMPLANT
ELECT NEEDLE TIP 2.8 STRL (NEEDLE) ×2 IMPLANT
ELECT REM PT RETURN 9FT ADLT (ELECTROSURGICAL) ×2
ELECTRODE REM PT RTRN 9FT ADLT (ELECTROSURGICAL) ×1 IMPLANT
GAUZE SPONGE 4X4 12PLY STRL (GAUZE/BANDAGES/DRESSINGS) ×1 IMPLANT
GLOVE BIOGEL PI IND STRL 7.0 (GLOVE) ×1 IMPLANT
GLOVE BIOGEL PI INDICATOR 7.0 (GLOVE) ×1
GLOVE SKINSENSE NS SZ8.0 LF (GLOVE) ×1
GLOVE SKINSENSE STRL SZ8.0 LF (GLOVE) ×1 IMPLANT
GLOVE SS N UNI LF 8.5 STRL (GLOVE) ×2 IMPLANT
GOWN STRL REUS W/ TWL LRG LVL3 (GOWN DISPOSABLE) ×1 IMPLANT
GOWN STRL REUS W/TWL LRG LVL3 (GOWN DISPOSABLE) ×2
GOWN STRL REUS W/TWL XL LVL3 (GOWN DISPOSABLE) ×2 IMPLANT
HAND ALUMI XLG (SOFTGOODS) ×2 IMPLANT
KIT ROOM TURNOVER APOR (KITS) ×2 IMPLANT
MANIFOLD NEPTUNE II (INSTRUMENTS) ×2 IMPLANT
NDL HYPO 21X1.5 SAFETY (NEEDLE) ×1 IMPLANT
NEEDLE HYPO 21X1.5 SAFETY (NEEDLE) ×2 IMPLANT
NS IRRIG 1000ML POUR BTL (IV SOLUTION) ×2 IMPLANT
PACK BASIC LIMB (CUSTOM PROCEDURE TRAY) ×2 IMPLANT
PAD ARMBOARD 7.5X6 YLW CONV (MISCELLANEOUS) ×2 IMPLANT
SET BASIN LINEN APH (SET/KITS/TRAYS/PACK) ×2 IMPLANT
SUT ETHILON 3 0 FSL (SUTURE) ×2 IMPLANT
SYR CONTROL 10ML LL (SYRINGE) ×2 IMPLANT

## 2016-07-23 NOTE — Anesthesia Postprocedure Evaluation (Signed)
Anesthesia Post Note  Patient: Tammy Suarez  Procedure(s) Performed: Procedure(s) (LRB): CARPAL TUNNEL RELEASE (Left)  Patient location during evaluation: Short Stay Anesthesia Type: Bier Block Level of consciousness: awake and alert and oriented Pain management: pain level controlled Vital Signs Assessment: post-procedure vital signs reviewed and stable Respiratory status: spontaneous breathing Cardiovascular status: blood pressure returned to baseline Postop Assessment: no signs of nausea or vomiting Anesthetic complications: no    Last Vitals:  Vitals:   07/23/16 1430 07/23/16 1457  BP: (!) 179/88 (!) 186/92  Pulse: 61 60  Resp: 10 16  Temp:  36.4 C    Last Pain:  Vitals:   07/23/16 1457  TempSrc: Oral  PainSc:                  [name]

## 2016-07-23 NOTE — Anesthesia Procedure Notes (Signed)
Anesthesia Regional Block:  Bier block (IV Regional)  Pre-Anesthetic Checklist: ,, timeout performed, Correct Patient, Correct Site, Correct Laterality, Correct Procedure,, site marked, surgical consent,, at surgeon's request  Laterality: Left     Needles:  Injection technique: Single-shot  Needle Type: Other      Needle Gauge: 22 and 22 G    Additional Needles: Bier block (IV Regional)  Nerve Stimulator or Paresthesia:   Additional Responses:  Pulse checked post tourniquet inflation. IV NSL discontinued post injection. Narrative:   Performed by: Personally

## 2016-07-23 NOTE — Brief Op Note (Signed)
07/23/2016  1:42 PM  PATIENT:  Tammy Suarez  70 y.o. female  PRE-OPERATIVE DIAGNOSIS:  LEFT CARPAL TUNNEL SYNDROME  POST-OPERATIVE DIAGNOSIS:  Left carpal tunnel syndrome  Left carpal tunnel was stenotic the median nerve was discolored the median nerve was abnormal in shape  Surgery was done as follows  Tammy Suarez was identified in the preop area. We checked her chart review did confirm the surgical site and marked.  She went back to surgery she had a Bier block it was successful  The left upper extremity was prepped and draped sterilely  We completed the timeout process  I made an incision over the carpal tunnel I divided subcutaneous tissue down to the palmar fascia. I found the distal aspect of the transverse carpal ligament and passed a blunt instrument beneath it and then released the transverse carpal ligament. I then inspected the carpal tunnel found to be clear of any space-occupying lesions  I then examined the nerve and the findings are noted above  After irrigation I closed with a combination of interrupted and running 3-0 nylon suture. We injected 10 mL of Marcaine plain along the wound edges  Sterile dressing was applied  When the tourniquet was released the fingers were viable with good color and capillary refill  Patient taken recovery room in stable condition PROCEDURE:  Procedure(s): CARPAL TUNNEL RELEASE (Left)  SURGEON:  Surgeon(s) and Role:    * Carole Civil, MD - Primary  PHYSICIAN ASSISTANT:   ASSISTANTS: none   ANESTHESIA:   none  EBL:  Total I/O In: 500 [I.V.:500] Out: 0   BLOOD ADMINISTERED:none  DRAINS: none   LOCAL MEDICATIONS USED:  MARCAINE     SPECIMEN:  No Specimen  DISPOSITION OF SPECIMEN:  N/A  COUNTS:  YES  TOURNIQUET:  * Missing tourniquet times found for documented tourniquets in log:  [id number] *  DICTATION: .Dragon Dictation  PLAN OF CARE: Discharge to home after PACU  PATIENT DISPOSITION:  PACU -  hemodynamically stable.   Delay start of Pharmacological VTE agent (>24hrs) due to surgical blood loss or risk of bleeding: not applicable

## 2016-07-23 NOTE — Transfer of Care (Signed)
Immediate Anesthesia Transfer of Care Note  Patient: Tammy Suarez  Procedure(s) Performed: Procedure(s): CARPAL TUNNEL RELEASE (Left)  Patient Location: PACU  Anesthesia Type:Bier block  Level of Consciousness: awake and alert   Airway & Oxygen Therapy: Patient Spontanous Breathing and Patient connected to face mask oxygen  Post-op Assessment: Report given to RN  Post vital signs: Reviewed and stable  Last Vitals:  Vitals:   07/23/16 1250 07/23/16 1255  BP: (!) 155/77 (!) 151/74  Resp:    Temp:      Last Pain:  Vitals:   07/23/16 1105  TempSrc: Oral  PainSc: 10-Worst pain ever      Patients Stated Pain Goal: 10 (19/95/79 0092)  Complications: No apparent anesthesia complications

## 2016-07-23 NOTE — Anesthesia Preprocedure Evaluation (Signed)
Anesthesia Evaluation  Patient identified by MRN, date of birth, ID band Patient awake    Reviewed: Allergy & Precautions, NPO status , Patient's Chart, lab work & pertinent test results  Airway Mallampati: I  TM Distance: >3 FB Neck ROM: Full    Dental  (+) Teeth Intact   Pulmonary Current Smoker,    breath sounds clear to auscultation       Cardiovascular hypertension, Pt. on medications + CAD, + Past MI and + CABG   Rhythm:Regular Rate:Normal     Neuro/Psych  Headaches, PSYCHIATRIC DISORDERS Anxiety Depression  Neuromuscular disease    GI/Hepatic hiatal hernia, GERD  ,  Endo/Other  diabetes, Type 2Hypothyroidism Morbid obesity  Renal/GU Renal disease     Musculoskeletal   Abdominal   Peds  Hematology   Anesthesia Other Findings   Reproductive/Obstetrics                             Anesthesia Physical Anesthesia Plan  ASA: III  Anesthesia Plan: Bier Block   Post-op Pain Management:    Induction: Intravenous  Airway Management Planned: Simple Face Mask  Additional Equipment:   Intra-op Plan:   Post-operative Plan:   Informed Consent: I have reviewed the patients History and Physical, chart, labs and discussed the procedure including the risks, benefits and alternatives for the proposed anesthesia with the patient or authorized representative who has indicated his/her understanding and acceptance.     Plan Discussed with:   Anesthesia Plan Comments:         Anesthesia Quick Evaluation

## 2016-07-23 NOTE — Discharge Instructions (Signed)
PATIENT INSTRUCTIONS POST-ANESTHESIA  IMMEDIATELY FOLLOWING SURGERY:  Do not drive or operate machinery for the first twenty four hours after surgery.  Do not make any important decisions for twenty four hours after surgery or while taking narcotic pain medications or sedatives.  If you develop intractable nausea and vomiting or a severe headache please notify your doctor immediately.  FOLLOW-UP:  Please make an appointment with your surgeon as instructed. You do not need to follow up with anesthesia unless specifically instructed to do so.  WOUND CARE INSTRUCTIONS (if applicable):  Keep a dry clean dressing on the anesthesia/puncture wound site if there is drainage.  Once the wound has quit draining you may leave it open to air.  Generally you should leave the bandage intact for twenty four hours unless there is drainage.  If the epidural site drains for more than 36-48 hours please call the anesthesia department.  QUESTIONS?:  Please feel free to call your physician or the hospital operator if you have any questions, and they will be happy to assist you.      Carpal Tunnel Release, Care After Refer to this sheet in the next few weeks. These instructions provide you with information about caring for yourself after your procedure. Your health care provider may also give you more specific instructions. Your treatment has been planned according to current medical practices, but problems sometimes occur. Call your health care provider if you have any problems or questions after your procedure. WHAT TO EXPECT AFTER THE PROCEDURE After your procedure, it is typical to have the following:  Pain.  Numbness.  Tingling.  Swelling.  Stiffness.  Bruising. HOME CARE INSTRUCTIONS  Take medicines only as directed by your health care provider.  There are many different ways to close and cover an incision, including stitches (sutures), skin glue, and adhesive strips. Follow your health care  provider's instructions about:  Incision care.  Bandage (dressing) changes and removal.  Incision closure removal.  Wear a splint or a brace as directed by your surgeon. You may need to do this for 2-3 weeks.  Keep your hand raised (elevated) above the level of your heart while you are resting. Move your fingers often.  Avoid activities that cause hand pain.  Ask your surgeon when you can start to do all of your usual activities again, such as:  Driving.  Returning to work.  Bathing and swimming.  Keep all follow-up visits as directed by your health care provider. This is important. You may need physical therapy for several months to speed healing and regain movement. SEEK MEDICAL CARE IF:  You have drainage, redness, swelling, or pain at your incision site.  You have a fever.  You have chills.  Your pain medicine is not working.  Your symptoms do not go away after 2 months.  Your symptoms go away and then return. SEEK IMMEDIATE MEDICAL CARE IF:  You have pain or numbness that is getting worse.  Your fingers change color.  You are not able to move your fingers.   This information is not intended to replace advice given to you by your health care provider. Make sure you discuss any questions you have with your health care provider.   Document Released: 05/22/2005 Document Revised: 11/23/2014 Document Reviewed: 06/20/2014 Elsevier Interactive Patient Education [employer].

## 2016-07-23 NOTE — Op Note (Signed)
PATIENT:  Tammy Suarez  70 y.o. female  PRE-OPERATIVE DIAGNOSIS:  LEFT CARPAL TUNNEL SYNDROME  POST-OPERATIVE DIAGNOSIS:  Left carpal tunnel syndrome  Left carpal tunnel was stenotic the median nerve was discolored the median nerve was abnormal in shape  Surgery was done as follows  Tammy Suarez was identified in the preop area. We checked her chart review did confirm the surgical site and marked.  She went back to surgery she had a Bier block it was successful  The left upper extremity was prepped and draped sterilely  We completed the timeout process  I made an incision over the carpal tunnel I divided subcutaneous tissue down to the palmar fascia. I found the distal aspect of the transverse carpal ligament and passed a blunt instrument beneath it and then released the transverse carpal ligament. I then inspected the carpal tunnel found to be clear of any space-occupying lesions  I then examined the nerve and the findings are noted above  After irrigation I closed with a combination of interrupted and running 3-0 nylon suture. We injected 10 mL of Marcaine plain along the wound edges  Sterile dressing was applied  When the tourniquet was released the fingers were viable with good color and capillary refill  Patient taken recovery room in stable condition PROCEDURE:  Procedure(s): CARPAL TUNNEL RELEASE (Left)  SURGEON:  Surgeon(s) and Role:    * Carole Civil, MD - Primary  PHYSICIAN ASSISTANT:   ASSISTANTS: none   ANESTHESIA:   none  EBL:  Total I/O In: 500 [I.V.:500] Out: 0   BLOOD ADMINISTERED:none  DRAINS: none   LOCAL MEDICATIONS USED:  MARCAINE     SPECIMEN:  No Specimen  DISPOSITION OF SPECIMEN:  N/A  COUNTS:  YES  TOURNIQUET:  * Missing tourniquet times found for documented tourniquets in log:  [id number] *  DICTATION: .Dragon Dictation  PLAN OF CARE: Discharge to home after PACU  PATIENT DISPOSITION:  PACU - hemodynamically stable.    Delay start of Pharmacological VTE agent (>24hrs) due to surgical blood loss or risk of bleeding: not applicable

## 2016-07-23 NOTE — Interval H&P Note (Signed)
History and Physical Interval Note:  07/23/2016 11:34 AM Temp 98 F (36.7 C) (Oral)   Surgical site skin normal  Tammy Suarez  has presented today for surgery, with the diagnosis of LEFT CARPAL TUNNEL SYNDROME  The various methods of treatment have been discussed with the patient and family. After consideration of risks, benefits and other options for treatment, the patient has consented to  Procedure(s): CARPAL TUNNEL RELEASE (Left) as a surgical intervention .  The patient's history has been reviewed, patient examined, no change in status, stable for surgery.  I have reviewed the patient's chart and labs.  Questions were answered to the patient's satisfaction.     Arther Abbott

## 2016-07-27 ENCOUNTER — Ambulatory Visit (INDEPENDENT_AMBULATORY_CARE_PROVIDER_SITE_OTHER): Payer: Medicare Other | Admitting: Orthopedic Surgery

## 2016-07-27 ENCOUNTER — Encounter: Payer: Self-pay | Admitting: Orthopedic Surgery

## 2016-07-27 VITALS — BP 144/75 | HR 67 | Ht 63.5 in | Wt 200.0 lb

## 2016-07-27 DIAGNOSIS — I10 Essential (primary) hypertension: Secondary | ICD-10-CM | POA: Diagnosis not present

## 2016-07-27 DIAGNOSIS — G5603 Carpal tunnel syndrome, bilateral upper limbs: Secondary | ICD-10-CM | POA: Diagnosis not present

## 2016-07-27 DIAGNOSIS — N183 Chronic kidney disease, stage 3 (moderate): Secondary | ICD-10-CM | POA: Diagnosis not present

## 2016-07-27 DIAGNOSIS — F331 Major depressive disorder, recurrent, moderate: Secondary | ICD-10-CM | POA: Diagnosis not present

## 2016-07-27 DIAGNOSIS — Z4789 Encounter for other orthopedic aftercare: Secondary | ICD-10-CM

## 2016-07-27 DIAGNOSIS — G894 Chronic pain syndrome: Secondary | ICD-10-CM | POA: Diagnosis not present

## 2016-07-27 DIAGNOSIS — E1140 Type 2 diabetes mellitus with diabetic neuropathy, unspecified: Secondary | ICD-10-CM | POA: Diagnosis not present

## 2016-07-27 DIAGNOSIS — M5116 Intervertebral disc disorders with radiculopathy, lumbar region: Secondary | ICD-10-CM | POA: Diagnosis not present

## 2016-07-27 DIAGNOSIS — F419 Anxiety disorder, unspecified: Secondary | ICD-10-CM | POA: Diagnosis not present

## 2016-07-27 DIAGNOSIS — I25119 Atherosclerotic heart disease of native coronary artery with unspecified angina pectoris: Secondary | ICD-10-CM | POA: Diagnosis not present

## 2016-07-27 DIAGNOSIS — E039 Hypothyroidism, unspecified: Secondary | ICD-10-CM | POA: Diagnosis not present

## 2016-07-27 DIAGNOSIS — G5602 Carpal tunnel syndrome, left upper limb: Secondary | ICD-10-CM

## 2016-07-27 DIAGNOSIS — M545 Low back pain: Secondary | ICD-10-CM | POA: Diagnosis not present

## 2016-07-27 DIAGNOSIS — Z79891 Long term (current) use of opiate analgesic: Secondary | ICD-10-CM | POA: Diagnosis not present

## 2016-07-27 NOTE — Patient Instructions (Signed)
Keep clean keep dry change as needed

## 2016-07-27 NOTE — Progress Notes (Signed)
Patient ID: Tammy Suarez, female   DOB: 06-May-1946, 70 y.o.   MRN: [id number]  Post op visit   Chief Complaint  Patient presents with  . Follow-up    post op 1, LEFT CTR, DOS 07/23/16    BP (!) 144/75   Pulse 67   Ht 5' 3.5" (1.613 m)   Wt 200 lb (90.7 kg)   BMI 34.87 kg/m   The patient reports some of the numbness is, night pain is completely gone  Her wound looks good  We will follow postop day 13 for suture removal

## 2016-07-28 ENCOUNTER — Encounter (HOSPITAL_COMMUNITY): Payer: Self-pay | Admitting: Orthopedic Surgery

## 2016-08-05 ENCOUNTER — Ambulatory Visit (INDEPENDENT_AMBULATORY_CARE_PROVIDER_SITE_OTHER): Payer: Medicare Other | Admitting: Orthopedic Surgery

## 2016-08-05 ENCOUNTER — Encounter: Payer: Self-pay | Admitting: Orthopedic Surgery

## 2016-08-05 VITALS — BP 143/71 | HR 75 | Wt 195.0 lb

## 2016-08-05 DIAGNOSIS — E1121 Type 2 diabetes mellitus with diabetic nephropathy: Secondary | ICD-10-CM | POA: Diagnosis not present

## 2016-08-05 DIAGNOSIS — T814XXA Infection following a procedure, initial encounter: Secondary | ICD-10-CM

## 2016-08-05 DIAGNOSIS — E785 Hyperlipidemia, unspecified: Secondary | ICD-10-CM | POA: Diagnosis not present

## 2016-08-05 DIAGNOSIS — G5602 Carpal tunnel syndrome, left upper limb: Secondary | ICD-10-CM

## 2016-08-05 DIAGNOSIS — E038 Other specified hypothyroidism: Secondary | ICD-10-CM | POA: Diagnosis not present

## 2016-08-05 DIAGNOSIS — I10 Essential (primary) hypertension: Secondary | ICD-10-CM | POA: Diagnosis not present

## 2016-08-05 DIAGNOSIS — IMO0001 Reserved for inherently not codable concepts without codable children: Secondary | ICD-10-CM

## 2016-08-05 DIAGNOSIS — Z4789 Encounter for other orthopedic aftercare: Secondary | ICD-10-CM

## 2016-08-05 MED ORDER — SULFAMETHOXAZOLE-TRIMETHOPRIM 800-160 MG PO TABS: 1.0000 | ORAL_TABLET | Freq: Two times a day (BID) | ORAL | 1 refills | Status: DC

## 2016-08-05 MED ORDER — HYDROCODONE-ACETAMINOPHEN 5-325 MG PO TABS: 1.0000 | ORAL_TABLET | ORAL | 0 refills | Status: DC | PRN

## 2016-08-05 NOTE — Progress Notes (Signed)
Patient ID: Tammy Suarez, female   DOB: 04/16/1946, 70 y.o.   MRN: [id number]  Post op visit   Chief Complaint  Patient presents with  . Follow-up    POST OP LEFT CTR, DOS 07/23/16    BP (!) 143/71   Pulse 75   Wt 195 lb (88.5 kg)   BMI 34.00 kg/m   Left carpal tunnel release.  Wound is infected I squeezed purulent material from the wound , it was erythematous.  Recommend continue with home exercises to regain full range of motion and strength  Stop smoking  Return 1 week   Encounter Diagnoses  Name Primary?  . Left carpal tunnel syndrome   . Surgical aftercare, musculoskeletal system   . Cellulitis, wound, post-operative, initial encounter Yes

## 2016-08-05 NOTE — Patient Instructions (Addendum)
Please try to stop smoking call your primary care doctor for assistance if needed. Smoking inhibits nerve recovery  Change dressing daily

## 2016-08-06 ENCOUNTER — Other Ambulatory Visit: Payer: Self-pay | Admitting: Family Medicine

## 2016-08-06 LAB — LIPID PANEL
CHOLESTEROL: 144 mg/dL (ref 125–200)
HDL: 36 mg/dL — AB (ref >=46)
LDL Cholesterol: 78 mg/dL (ref <130)
TRIGLYCERIDES: 152 mg/dL — AB (ref <150)
Total CHOL/HDL Ratio: 4 Ratio (ref <=5.0)
VLDL: 30 mg/dL (ref <30)

## 2016-08-06 LAB — COMPLETE METABOLIC PANEL WITH GFR
ALBUMIN: 3.9 g/dL (ref 3.6–5.1)
ALT: 6 U/L (ref 6–29)
AST: 13 U/L (ref 10–35)
Alkaline Phosphatase: 51 U/L (ref 33–130)
BILIRUBIN TOTAL: 0.4 mg/dL (ref 0.2–1.2)
BUN: 30 mg/dL — ABNORMAL HIGH (ref 7–25)
CALCIUM: 10.3 mg/dL (ref 8.6–10.4)
CO2: 26 mmol/L (ref 20–31)
Chloride: 102 mmol/L (ref 98–110)
Creat: 1.38 mg/dL — ABNORMAL HIGH (ref 0.60–0.93)
GFR, EST AFRICAN AMERICAN: 45 mL/min — AB (ref >=60)
GFR, EST NON AFRICAN AMERICAN: 39 mL/min — AB (ref >=60)
Glucose, Bld: 160 mg/dL — ABNORMAL HIGH (ref 65–99)
Potassium: 4.6 mmol/L (ref 3.5–5.3)
Sodium: 137 mmol/L (ref 135–146)
TOTAL PROTEIN: 6.8 g/dL (ref 6.1–8.1)

## 2016-08-06 LAB — TSH: TSH: 5.46 m[IU]/L — AB

## 2016-08-06 LAB — HEMOGLOBIN A1C
HEMOGLOBIN A1C: 6.5 % — AB (ref <5.7)
MEAN PLASMA GLUCOSE: 140 mg/dL

## 2016-08-12 ENCOUNTER — Ambulatory Visit (INDEPENDENT_AMBULATORY_CARE_PROVIDER_SITE_OTHER): Payer: Medicare Other | Admitting: Orthopedic Surgery

## 2016-08-12 ENCOUNTER — Encounter: Payer: Self-pay | Admitting: Orthopedic Surgery

## 2016-08-12 VITALS — BP 161/75 | HR 64 | Ht 62.0 in | Wt 195.0 lb

## 2016-08-12 DIAGNOSIS — G5602 Carpal tunnel syndrome, left upper limb: Secondary | ICD-10-CM

## 2016-08-12 DIAGNOSIS — Z4789 Encounter for other orthopedic aftercare: Secondary | ICD-10-CM

## 2016-08-12 NOTE — Progress Notes (Signed)
Patient ID: Tammy Suarez, female   DOB: 12/01/1945, 70 y.o.   MRN: [id number]  Post op visit   Chief Complaint  Patient presents with  . Follow-up    left carpal tunnel, DOS 07/23/16    BP (!) 161/75   Pulse 64   Ht 5\' 2"  (1.575 m)   Wt 195 lb (88.5 kg)   BMI 35.67 kg/m   Postoperative carpal tunnel release.  Treated with by mouth antibiotics for wound cellulitis  Wound has cleared. Patient would like to have a right carpal tunnel release done in about 2 months  She'll come back in 6 weeks for preop.

## 2016-08-14 ENCOUNTER — Other Ambulatory Visit: Payer: Self-pay

## 2016-08-14 MED ORDER — LEVOTHYROXINE SODIUM 112 MCG PO TABS: 112.0000 ug | ORAL_TABLET | Freq: Every day | ORAL | 4 refills | Status: DC

## 2016-08-19 ENCOUNTER — Ambulatory Visit: Payer: Self-pay | Admitting: Family Medicine

## 2016-08-25 ENCOUNTER — Telehealth: Payer: Self-pay | Admitting: Orthopedic Surgery

## 2016-08-25 NOTE — Telephone Encounter (Signed)
Hydrocodone-Acetaminophen  5/325 mg  Qty 30 Tablets °

## 2016-08-25 NOTE — Telephone Encounter (Signed)
ROUTING TO DR HARRISON FOR APPROVAL 

## 2016-08-25 NOTE — Telephone Encounter (Signed)
Can resume after surgery

## 2016-08-26 NOTE — Telephone Encounter (Signed)
Patient made aware.

## 2016-09-01 ENCOUNTER — Ambulatory Visit: Payer: Self-pay | Admitting: Family Medicine

## 2016-09-09 ENCOUNTER — Encounter (INDEPENDENT_AMBULATORY_CARE_PROVIDER_SITE_OTHER): Payer: Self-pay | Admitting: *Deleted

## 2016-09-09 ENCOUNTER — Ambulatory Visit (INDEPENDENT_AMBULATORY_CARE_PROVIDER_SITE_OTHER): Payer: Medicare Other | Admitting: Family Medicine

## 2016-09-09 ENCOUNTER — Encounter: Payer: Self-pay | Admitting: Family Medicine

## 2016-09-09 VITALS — BP 130/72 | HR 66 | Ht 62.0 in | Wt 199.4 lb

## 2016-09-09 DIAGNOSIS — E785 Hyperlipidemia, unspecified: Secondary | ICD-10-CM | POA: Diagnosis not present

## 2016-09-09 DIAGNOSIS — Z23 Encounter for immunization: Secondary | ICD-10-CM | POA: Diagnosis not present

## 2016-09-09 DIAGNOSIS — N39498 Other specified urinary incontinence: Secondary | ICD-10-CM

## 2016-09-09 DIAGNOSIS — F418 Other specified anxiety disorders: Secondary | ICD-10-CM

## 2016-09-09 DIAGNOSIS — E038 Other specified hypothyroidism: Secondary | ICD-10-CM | POA: Diagnosis not present

## 2016-09-09 DIAGNOSIS — M48061 Spinal stenosis, lumbar region without neurogenic claudication: Secondary | ICD-10-CM | POA: Diagnosis not present

## 2016-09-09 DIAGNOSIS — E559 Vitamin D deficiency, unspecified: Secondary | ICD-10-CM

## 2016-09-09 DIAGNOSIS — E1121 Type 2 diabetes mellitus with diabetic nephropathy: Secondary | ICD-10-CM

## 2016-09-09 DIAGNOSIS — Z1211 Encounter for screening for malignant neoplasm of colon: Secondary | ICD-10-CM

## 2016-09-09 DIAGNOSIS — I25119 Atherosclerotic heart disease of native coronary artery with unspecified angina pectoris: Secondary | ICD-10-CM | POA: Diagnosis not present

## 2016-09-09 DIAGNOSIS — F17218 Nicotine dependence, cigarettes, with other nicotine-induced disorders: Secondary | ICD-10-CM

## 2016-09-09 DIAGNOSIS — M5416 Radiculopathy, lumbar region: Secondary | ICD-10-CM | POA: Diagnosis not present

## 2016-09-09 DIAGNOSIS — Z1231 Encounter for screening mammogram for malignant neoplasm of breast: Secondary | ICD-10-CM

## 2016-09-09 DIAGNOSIS — I10 Essential (primary) hypertension: Secondary | ICD-10-CM

## 2016-09-09 MED ORDER — KETOROLAC TROMETHAMINE 60 MG/2ML IM SOLN
60.0000 mg | Freq: Once | INTRAMUSCULAR | Status: AC
  Administered 2016-09-09: 60 mg via INTRAMUSCULAR

## 2016-09-09 MED ORDER — CLONAZEPAM 1 MG PO TABS: 1.0000 mg | ORAL_TABLET | Freq: Three times a day (TID) | ORAL | 3 refills | Status: DC

## 2016-09-09 MED ORDER — OXYBUTYNIN CHLORIDE ER 10 MG PO TB24: 10.0000 mg | ORAL_TABLET | Freq: Every day | ORAL | 4 refills | Status: DC

## 2016-09-09 MED ORDER — METHYLPREDNISOLONE ACETATE 80 MG/ML IJ SUSP
80.0000 mg | Freq: Once | INTRAMUSCULAR | Status: AC
  Administered 2016-09-09: 80 mg via INTRAMUSCULAR

## 2016-09-09 NOTE — Patient Instructions (Addendum)
F/u in 4 month, call if you need me sooner   Fasting lipid, cmp and eGFr, hBA1c tSH  And vit D in 4 month  Flu vaccine and foot exam today  Toradol and depo medrol in office today for generalized pain and referral to [facility] pain clinic per your request   Need to continue to cut back on smoking, need to QUIT  Congrats on weight loss, keep it up!      You Can Quit Smoking If you are ready to quit smoking or are thinking about it, congratulations! You have chosen to help yourself be healthier and live longer! There are lots of different ways to quit smoking. Nicotine gum, nicotine patches, a nicotine inhaler, or nicotine nasal spray can help with physical craving. Hypnosis, support groups, and medicines help break the habit of smoking. TIPS TO GET OFF AND STAY OFF CIGARETTES  Learn to predict your moods. Do not let a bad situation be your excuse to have a cigarette. Some situations in your life might tempt you to have a cigarette.  Ask friends and co-workers not to smoke around you.  Make your home smoke-free.  Never have "just one" cigarette. It leads to wanting another and another. Remind yourself of your decision to quit.  On a card, make a list of your reasons for not smoking. Read it at least the same number of times a day as you have a cigarette. Tell yourself everyday, "I do not want to smoke. I choose not to smoke."  Ask someone at home or work to help you with your plan to quit smoking.  Have something planned after you eat or have a cup of coffee. Take a walk or get other exercise to perk you up. This will help to keep you from overeating.  Try a relaxation exercise to calm you down and decrease your stress. Remember, you may be tense and nervous the first two weeks after you quit. This will pass.  Find new activities to keep your hands busy. Play with a pen, coin, or rubber band. Doodle or draw things on paper.  Brush your teeth right after eating. This will help  cut down the craving for the taste of tobacco after meals. You can try mouthwash too.  Try gum, breath mints, or diet candy to keep something in your mouth. IF YOU SMOKE AND WANT TO QUIT:  Do not stock up on cigarettes. Never buy a carton. Wait until one pack is finished before you buy another.  Never carry cigarettes with you at work or at home.  Keep cigarettes as far away from you as possible. Leave them with someone else.  Never carry matches or a lighter with you.  Ask yourself, "Do I need this cigarette or is this just a reflex?"  Bet with someone that you can quit. Put cigarette money in a piggy bank every morning. If you smoke, you give up the money. If you do not smoke, by the end of the week, you keep the money.  Keep trying. It takes 21 days to change a habit!  Talk to your doctor about using medicines to help you quit. These include nicotine replacement gum, lozenges, or skin patches.   This information is not intended to replace advice given to you by your health care provider. Make sure you discuss any questions you have with your health care provider.   Document Released: 08/29/2009 Document Revised: 01/25/2012 Document Reviewed: 08/29/2009 Elsevier Interactive Patient Education [employer]

## 2016-09-10 ENCOUNTER — Encounter (INDEPENDENT_AMBULATORY_CARE_PROVIDER_SITE_OTHER): Payer: Self-pay

## 2016-09-13 ENCOUNTER — Encounter: Payer: Self-pay | Admitting: Family Medicine

## 2016-09-13 NOTE — Assessment & Plan Note (Signed)
Uncontrolled.Toradol and depo medrol administered IM in the office , 

## 2016-09-13 NOTE — Assessment & Plan Note (Signed)
Controlled, no change in medication DASH diet and commitment to daily physical activity for a minimum of 30 minutes discussed and encouraged, as a part of hypertension management. The importance of attaining a healthy weight is also discussed.  BP/Weight 09/09/2016 08/12/2016 08/05/2016 07/27/2016 07/23/2016 07/17/2016 02/22/8285  Systolic BP 751 982 429 980 699 967 227  Diastolic BP 72 75 71 75 92 85 79  Wt. (Lbs) 199.4 195 195 200 - 199.5 197  BMI 36.47 35.67 34 34.87 - 35.34 34.9

## 2016-09-13 NOTE — Assessment & Plan Note (Signed)
Hyperlipidemia:Low fat diet discussed and encouraged.   Lipid Panel  Lab Results  Component Value Date   CHOL 144 08/05/2016   HDL 36 (L) 08/05/2016   LDLCALC 78 08/05/2016   TRIG 152 (H) 08/05/2016   CHOLHDL 4.0 08/05/2016   No med change, needs to reduce fatty food and increase exercise
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
## 2016-09-13 NOTE — Assessment & Plan Note (Signed)
Deteriorated. Patient re-educated about  the importance of commitment to a  minimum of 150 minutes of exercise per week.  The importance of healthy food choices with portion control discussed. Encouraged to start a food diary, count calories and to consider  joining a support group. Sample diet sheets offered. Goals set by the patient for the next several months.   Weight /BMI 09/09/2016 08/12/2016 08/05/2016  WEIGHT 199 lb 6.4 oz 195 lb 195 lb  HEIGHT 5\' 2"  5\' 2"  -  BMI 36.47 kg/m2 35.67 kg/m2 34 kg/m2

## 2016-09-13 NOTE — Assessment & Plan Note (Signed)
mildly under corrected , dose has been adjusted

## 2016-09-13 NOTE — Assessment & Plan Note (Signed)
Improved and controlled on current medication, grand daughter to start opioid addiction treatment

## 2016-09-13 NOTE — Assessment & Plan Note (Signed)
Controlled, no change in management Tammy Suarez is reminded of the importance of commitment to daily physical activity for 30 minutes or more, as able and the need to limit carbohydrate intake to 30 to 60 grams per meal to help with blood sugar control.   The need to take medication as prescribed, test blood sugar as directed, and to call between visits if there is a concern that blood sugar is uncontrolled is also discussed.   [name] is reminded of the importance of daily foot exam, annual eye examination, and good blood sugar, blood pressure and cholesterol control.  Diabetic Labs Latest Ref Rng & Units 08/05/2016 07/17/2016 06/17/2016 01/07/2016 10/02/2015  HbA1c <5.7 % 6.5(H) - - 7.4(H) -  Microalbumin Not estab mg/dL - - - - 2.4  Micro/Creat Ratio <30 mcg/mg creat - - - - 22  Chol 125 - 200 mg/dL 144 - - 173 -  HDL >=46 mg/dL 36(L) - - 32(L) -  Calc LDL <130 mg/dL 78 - - 79 -  Triglycerides <150 mg/dL 152(H) - - 310(H) -  Creatinine 0.60 - 0.93 mg/dL 1.38(H) 1.47(H) 1.50(H) 1.39(H) -   BP/Weight 09/09/2016 08/12/2016 08/05/2016 07/27/2016 07/23/2016 07/17/2016 12/02/3565  Systolic BP 014 103 013 143 888 757 972  Diastolic BP 72 75 71 75 92 85 79  Wt. (Lbs) 199.4 195 195 200 - 199.5 197  BMI 36.47 35.67 34 34.87 - 35.34 34.9   Foot/eye exam completion dates Latest Ref Rng & Units 09/09/2016 02/05/2016  Eye Exam No Retinopathy - No Retinopathy  Foot exam Order - - -  Foot Form Completion - Done -

## 2016-09-13 NOTE — Assessment & Plan Note (Signed)

## 2016-09-15 ENCOUNTER — Other Ambulatory Visit: Payer: Self-pay | Admitting: Family Medicine

## 2016-09-18 ENCOUNTER — Other Ambulatory Visit: Payer: Self-pay | Admitting: Family Medicine

## 2016-09-21 ENCOUNTER — Telehealth: Payer: Self-pay | Admitting: Orthopedic Surgery

## 2016-09-21 NOTE — Telephone Encounter (Signed)
Patient called and stated that she wanted to cancel her appointment for this coming Wednesday 09-23-16.  She stated that she wanted to wait until after Christmas and the first of the year to have any surgery.  She said she would call back to reschedule another appointment

## 2016-09-23 ENCOUNTER — Ambulatory Visit: Payer: Self-pay | Admitting: Orthopedic Surgery

## 2016-10-08 ENCOUNTER — Other Ambulatory Visit: Payer: Self-pay | Admitting: Family Medicine

## 2016-10-27 ENCOUNTER — Other Ambulatory Visit: Payer: Self-pay | Admitting: Family Medicine

## 2016-11-03 ENCOUNTER — Other Ambulatory Visit: Payer: Self-pay | Admitting: *Deleted

## 2016-11-03 DIAGNOSIS — Z4789 Encounter for other orthopedic aftercare: Secondary | ICD-10-CM

## 2016-11-03 DIAGNOSIS — IMO0001 Reserved for inherently not codable concepts without codable children: Secondary | ICD-10-CM

## 2016-11-03 DIAGNOSIS — G5602 Carpal tunnel syndrome, left upper limb: Secondary | ICD-10-CM

## 2016-11-03 DIAGNOSIS — T814XXA Infection following a procedure, initial encounter: Secondary | ICD-10-CM

## 2016-11-03 MED ORDER — SULFAMETHOXAZOLE-TRIMETHOPRIM 800-160 MG PO TABS: 1.0000 | ORAL_TABLET | Freq: Two times a day (BID) | ORAL | 5 refills | Status: DC

## 2016-11-05 ENCOUNTER — Other Ambulatory Visit: Payer: Self-pay | Admitting: Family Medicine

## 2016-11-05 DIAGNOSIS — Z1231 Encounter for screening mammogram for malignant neoplasm of breast: Secondary | ICD-10-CM

## 2016-11-19 ENCOUNTER — Ambulatory Visit (HOSPITAL_COMMUNITY): Payer: Self-pay

## 2016-11-26 ENCOUNTER — Ambulatory Visit (HOSPITAL_COMMUNITY)
Admission: RE | Admit: 2016-11-26 | Discharge: 2016-11-26 | Disposition: A | Discharge status: Home / Self Care | Payer: Medicare Other | Source: Ambulatory Visit | Attending: Family Medicine | Admitting: Family Medicine

## 2016-11-26 DIAGNOSIS — E785 Hyperlipidemia, unspecified: Secondary | ICD-10-CM | POA: Diagnosis not present

## 2016-11-26 DIAGNOSIS — I10 Essential (primary) hypertension: Secondary | ICD-10-CM | POA: Diagnosis not present

## 2016-11-26 DIAGNOSIS — Z1231 Encounter for screening mammogram for malignant neoplasm of breast: Secondary | ICD-10-CM | POA: Insufficient documentation

## 2016-11-26 DIAGNOSIS — E559 Vitamin D deficiency, unspecified: Secondary | ICD-10-CM | POA: Diagnosis not present

## 2016-11-26 DIAGNOSIS — E1121 Type 2 diabetes mellitus with diabetic nephropathy: Secondary | ICD-10-CM | POA: Diagnosis not present

## 2016-11-26 LAB — COMPLETE METABOLIC PANEL WITH GFR
ALBUMIN: 3.8 g/dL (ref 3.6–5.1)
ALK PHOS: 39 U/L (ref 33–130)
ALT: 11 U/L (ref 6–29)
AST: 22 U/L (ref 10–35)
BUN: 32 mg/dL — ABNORMAL HIGH (ref 7–25)
CALCIUM: 9.9 mg/dL (ref 8.6–10.4)
CHLORIDE: 100 mmol/L (ref 98–110)
CO2: 27 mmol/L (ref 20–31)
Creat: 1.84 mg/dL — ABNORMAL HIGH (ref 0.60–0.93)
GFR, EST NON AFRICAN AMERICAN: 27 mL/min — AB (ref >=60)
GFR, Est African American: 32 mL/min — ABNORMAL LOW (ref >=60)
Glucose, Bld: 148 mg/dL — ABNORMAL HIGH (ref 65–99)
POTASSIUM: 5 mmol/L (ref 3.5–5.3)
SODIUM: 135 mmol/L (ref 135–146)
Total Bilirubin: 0.4 mg/dL (ref 0.2–1.2)
Total Protein: 6.8 g/dL (ref 6.1–8.1)

## 2016-11-26 LAB — LIPID PANEL
CHOL/HDL RATIO: 5.4 ratio — AB (ref <5.0)
CHOLESTEROL: 161 mg/dL (ref <200)
HDL: 30 mg/dL — AB (ref >50)
LDL Cholesterol: 88 mg/dL (ref <100)
Triglycerides: 216 mg/dL — ABNORMAL HIGH (ref <150)
VLDL: 43 mg/dL — AB (ref <30)

## 2016-11-26 LAB — TSH: TSH: 0.25 m[IU]/L — AB

## 2016-11-27 ENCOUNTER — Other Ambulatory Visit: Payer: Self-pay | Admitting: Family Medicine

## 2016-11-27 LAB — VITAMIN D 25 HYDROXY (VIT D DEFICIENCY, FRACTURES): VIT D 25 HYDROXY: 30 ng/mL (ref 30–100)

## 2016-11-27 LAB — HEMOGLOBIN A1C
HEMOGLOBIN A1C: 6.3 % — AB (ref <5.7)
MEAN PLASMA GLUCOSE: 134 mg/dL

## 2016-12-04 ENCOUNTER — Other Ambulatory Visit: Payer: Self-pay

## 2016-12-04 ENCOUNTER — Telehealth: Payer: Self-pay

## 2016-12-04 DIAGNOSIS — E038 Other specified hypothyroidism: Secondary | ICD-10-CM

## 2016-12-04 MED ORDER — ROSUVASTATIN CALCIUM 40 MG PO TABS: 40.0000 mg | ORAL_TABLET | Freq: Every day | ORAL | 3 refills | Status: DC

## 2016-12-04 MED ORDER — LEVOTHYROXINE SODIUM 100 MCG PO TABS: 100.0000 ug | ORAL_TABLET | Freq: Every day | ORAL | 3 refills | Status: DC

## 2016-12-04 NOTE — Telephone Encounter (Signed)
-----   Message from Fayrene Helper, MD sent at 11/27/2016  6:43 AM EST ----- pls needs rept TSH for Feb visit 3 days before also , thanks

## 2016-12-05 ENCOUNTER — Other Ambulatory Visit: Payer: Self-pay | Admitting: Family Medicine

## 2016-12-07 ENCOUNTER — Encounter: Payer: Self-pay | Admitting: Cardiology

## 2016-12-07 ENCOUNTER — Ambulatory Visit (INDEPENDENT_AMBULATORY_CARE_PROVIDER_SITE_OTHER): Payer: Medicare Other | Admitting: Cardiology

## 2016-12-07 VITALS — BP 128/64 | HR 70 | Ht 65.0 in | Wt 199.0 lb

## 2016-12-07 DIAGNOSIS — I25709 Atherosclerosis of coronary artery bypass graft(s), unspecified, with unspecified angina pectoris: Secondary | ICD-10-CM

## 2016-12-07 DIAGNOSIS — N183 Chronic kidney disease, stage 3 unspecified: Secondary | ICD-10-CM

## 2016-12-07 DIAGNOSIS — I209 Angina pectoris, unspecified: Secondary | ICD-10-CM

## 2016-12-07 DIAGNOSIS — I10 Essential (primary) hypertension: Secondary | ICD-10-CM

## 2016-12-07 DIAGNOSIS — E782 Mixed hyperlipidemia: Secondary | ICD-10-CM | POA: Diagnosis not present

## 2016-12-07 NOTE — Patient Instructions (Signed)
Your physician wants you to follow-up in: 6 months with Dr McDowell You will receive a reminder letter in the mail two months in advance. If you don't receive a letter, please call our office to schedule the follow-up appointment.     Your physician recommends that you continue on your current medications as directed. Please refer to the Current Medication list given to you today.    If you need a refill on your cardiac medications before your next appointment, please call your pharmacy.     Thank you for choosing [facility] !        

## 2016-12-07 NOTE — Progress Notes (Signed)
Cardiology Office Note  Date: 12/07/2016   ID: Tammy Suarez, DOB 03-08-1946, MRN [id number]  PCP: Tula Nakayama, MD  Primary Cardiologist: Rozann Lesches, MD   Chief Complaint  Patient presents with  . Coronary Artery Disease    History of Present Illness: Tammy Suarez is a 71 y.o. female last seen in July 2017. She presents for a routine follow-up visit. Reports only 2 episodes of nitroglycerin use in the last 6 months. No regular angina or increasing shortness of breath. She has been limited by back pain but has had some epidural injections that have been helpful.  I reviewed her medications which are outlined below. Cardiac regimen is overall stable. She did have a recent lipid panel with Dr. Moshe Cipro. Reports tolerating high-dose Crestor without side effects.  She underwent bypass surgery in December 2014 as outlined below. We continue with observation for now.  Past Medical History:  Diagnosis Date  . Arthritis   . Carpal tunnel syndrome    bilaterally  . Chronic back pain   . CKD (chronic kidney disease) stage 3, GFR 30-59 ml/min   . Coronary atherosclerosis of native coronary artery    Multivessel status post CABG - LIMA to LAD, SVG to OM and SVG to PDA  . Depression   . Essential hypertension, benign   . GERD (gastroesophageal reflux disease)   . Headache   . History of conjunctivitis   . History of hiatal hernia   . Hyperlipidemia   . Hypothyroidism   . Myocardial infarction   . Obesity   . Type 2 diabetes mellitus without complications ([place])   . Vitamin B 12 deficiency     Past Surgical History:  Procedure Laterality Date  . ABDOMINAL HYSTERECTOMY    . BACK SURGERY    . CARDIAC SURGERY    . CARPAL TUNNEL RELEASE Left 07/23/2016   Procedure: CARPAL TUNNEL RELEASE;  Surgeon: Carole Civil, MD;  Location: [facility];  Service: Orthopedics;  Laterality: Left;  . CERVICAL LAMINECTOMY    . CHOLECYSTECTOMY    . CORONARY ARTERY BYPASS GRAFT N/A  11/02/2013   Procedure: CORONARY ARTERY BYPASS GRAFTING (CABG);  Surgeon: Gaye Pollack, MD;  Location: [facility];  Service: Open Heart Surgery;  Laterality: N/A;  CABG x three, using left internal mammary artery and right leg greater saphenous vein harvested endoscopically  . COSMETIC SURGERY  07/01/2012   Recurrent skin infection on lower abdomen, [facility]  . HERNIA REPAIR    . Inguinal herniorrhaphy    . INTRAOPERATIVE TRANSESOPHAGEAL ECHOCARDIOGRAM N/A 11/02/2013   Procedure: INTRAOPERATIVE TRANSESOPHAGEAL ECHOCARDIOGRAM;  Surgeon: Gaye Pollack, MD;  Location: [facility] OR;  Service: Open Heart Surgery;  Laterality: N/A;  . LAPAROSCOPIC GASTRIC BANDING  2009  . LEFT HEART CATHETERIZATION WITH CORONARY ANGIOGRAM N/A 10/30/2013   Procedure: LEFT HEART CATHETERIZATION WITH CORONARY ANGIOGRAM;  Surgeon: Minus Breeding, MD;  Location[facility] C[facility] LAB;  Service: Cardiovascular;  Laterality: N/A;  . Left index finger repair    . LUMBAR FUSION    . SPINE SURGERY    . VESICOVAGINAL FISTULA CLOSURE W/ TAH      Current Outpatient Prescriptions  Medication Sig Dispense Refill  . amitriptyline (ELAVIL) 25 MG tablet TAKE 1 TABLET BY MOUTH AT BEDTIME 30 tablet 3  . Aspirin-Acetaminophen-Caffeine (GOODY HEADACHE PO) Take 1 Package by mouth 2 (two) times daily as needed (headache).    Marland Kitchen buPROPion (WELLBUTRIN XL) 300 MG 24 hr tablet TAKE 1 TABLET BY MOUTH EVERY MORNING  90 tablet 1  . clonazePAM (KLONOPIN) 1 MG tablet Take 1 tablet (1 mg total) by mouth 3 (three) times daily. 90 tablet 3  . DULoxetine (CYMBALTA) 60 MG capsule TAKE ONE CAPSULE BY MOUTH TWICE A DAY 180 capsule 1  . fenofibrate (TRICOR) 145 MG tablet TAKE 1 TABLET BY MOUTH EVERYDAY 90 tablet 1  . furosemide (LASIX) 20 MG tablet Take daily as needed for leg swelling (Patient taking differently: Take 20 mg by mouth daily as needed for fluid. Take daily as needed for leg swelling) 30 tablet 6  . levothyroxine (SYNTHROID, LEVOTHROID) 100 MCG tablet Take 1  tablet (100 mcg total) by mouth daily. 30 tablet 3  . loratadine (CLARITIN) 10 MG tablet Take 1 tablet (10 mg total) by mouth daily as needed for allergies. 30 tablet 6  . metoprolol succinate (TOPROL-XL) 25 MG 24 hr tablet TAKE 1 TABLET BY MOUTH DAILY 90 tablet 0  . Multiple Vitamin (MULTIVITAMIN) tablet Take 1 tablet by mouth daily.    . nitroGLYCERIN (NITROSTAT) 0.4 MG SL tablet PLACE 1 TABLET UNDER THE TONGUE EVERY 5 MINUTES AS NEEDED FOR CHEST PAIN 25 tablet 1  . Omega-3 Fatty Acids (FISH OIL PO) Take 1 capsule by mouth daily.    Marland Kitchen oxybutynin (DITROPAN-XL) 10 MG 24 hr tablet Take 1 tablet (10 mg total) by mouth at bedtime. 30 tablet 4  . ranitidine (ZANTAC) 150 MG tablet TAKE 1 TABLET BY MOUTH TWICE A DAY 180 tablet 0  . rosuvastatin (CRESTOR) 40 MG tablet Take 1 tablet (40 mg total) by mouth daily. 30 tablet 3   No current facility-administered medications for this visit.    Allergies:  Gabapentin; Meperidine hcl; Niacin; and Propoxyphene n-acetaminophen   Social History: The patient  reports that she has been smoking Cigarettes.  She has a 37.50 pack-year smoking history. She quit smokeless tobacco use about 2 years ago. She reports that she does not drink alcohol or use drugs.   ROS:  Please see the history of present illness. Otherwise, complete review of systems is positive for chronic back pain and right shoulder pain.  All other systems are reviewed and negative.   Physical Exam: VS:  BP 128/64   Pulse 70   Ht 5\' 5"  (1.651 m)   Wt 199 lb (90.3 kg)   SpO2 97%   BMI 33.12 kg/m , BMI Body mass index is 33.12 kg/m.  Wt Readings from Last 3 Encounters:  12/07/16 199 lb (90.3 kg)  09/09/16 199 lb 6.4 oz (90.4 kg)  08/12/16 195 lb (88.5 kg)    Overweight woman, appears comfortable at rest.  HEENT: Conjunctiva and lids normal, oropharynx clear.  Neck: Supple, no elevated JVP or carotid bruits, no thyromegaly.  Lungs: Clear to auscultation, nonlabored breathing at rest.   Cardiac: Regular rate and rhythm, no S3 or significant systolic murmur, no pericardial rub.  Abdomen: Soft, nontender, bowel sounds present, no guarding or rebound.  Extremities: No pitting edema, distal pulses 2+. Bilateral wrist braces in place. Skin: Warm and dry. Musculoskeletal: No kyphosis. Neuropsychiatric: Alert and oriented 3, affect appropriate.  ECG: I personally reviewed the tracing from 06/17/2016 which showed sinus rhythm with leftward axis and R' in lead V1 and V2.  Recent Labwork: 07/17/2016: Hemoglobin 12.5; Platelets 276 11/26/2016: ALT 11; AST 22; BUN 32; Creat 1.84; Potassium 5.0; Sodium 135; TSH 0.25     Component Value Date/Time   CHOL 161 11/26/2016 0858   TRIG 216 (H) 11/26/2016 0858   HDL  30 (L) 11/26/2016 0858   CHOLHDL 5.4 (H) 11/26/2016 0858   VLDL 43 (H) 11/26/2016 0858   LDLCALC 88 11/26/2016 0858    Other Studies Reviewed Today:  Echocardiogram 11/01/2013: Study Conclusions  Left ventricle: The cavity size was normal. Wall thickness was normal. Systolic function was normal. The estimated ejection fraction was in the range of 55% to 60%. Wall motion was normal; there were no regional wall motion abnormalities. There was an increased relative contribution of atrial contraction to ventricular filling.  Assessment and Plan:  1. Multivessel CAD status post CABG in 2014. She is symptomatically stable with infrequent angina symptoms on medical therapy. We will continue with observation for now.  2. Mixed hyperlipidemia, continues on Crestor and TriCor. Recent lipid panel reviewed above. LDL was 88.  3. CKD stage 3, creatinine 1.8 assess recently.  4. Essential hypertension, blood pressure is adequately controlled today.  Current medicines were reviewed with the patient today.  Disposition: Follow-up in 6 months.  Signed, Satira Sark, MD, [facility] 12/07/2016 2:13 PM    [facility] at [facility] [address],  [place], [place] [address] Phone: [phone]; Fax: [phone]

## 2016-12-18 ENCOUNTER — Other Ambulatory Visit: Payer: Self-pay | Admitting: Family Medicine

## 2016-12-24 ENCOUNTER — Other Ambulatory Visit: Payer: Self-pay | Admitting: Family Medicine

## 2016-12-26 ENCOUNTER — Other Ambulatory Visit: Payer: Self-pay | Admitting: Family Medicine

## 2017-01-11 ENCOUNTER — Ambulatory Visit (INDEPENDENT_AMBULATORY_CARE_PROVIDER_SITE_OTHER): Payer: Medicare Other | Admitting: Family Medicine

## 2017-01-11 ENCOUNTER — Ambulatory Visit: Payer: Self-pay | Admitting: Family Medicine

## 2017-01-11 ENCOUNTER — Encounter: Payer: Self-pay | Admitting: Family Medicine

## 2017-01-11 VITALS — BP 150/80 | HR 71 | Resp 15 | Ht 65.0 in | Wt 199.4 lb

## 2017-01-11 DIAGNOSIS — E1121 Type 2 diabetes mellitus with diabetic nephropathy: Secondary | ICD-10-CM | POA: Diagnosis not present

## 2017-01-11 DIAGNOSIS — I25709 Atherosclerosis of coronary artery bypass graft(s), unspecified, with unspecified angina pectoris: Secondary | ICD-10-CM | POA: Diagnosis not present

## 2017-01-11 DIAGNOSIS — R296 Repeated falls: Secondary | ICD-10-CM

## 2017-01-11 DIAGNOSIS — G894 Chronic pain syndrome: Secondary | ICD-10-CM | POA: Diagnosis not present

## 2017-01-11 DIAGNOSIS — I10 Essential (primary) hypertension: Secondary | ICD-10-CM | POA: Diagnosis not present

## 2017-01-11 DIAGNOSIS — E038 Other specified hypothyroidism: Secondary | ICD-10-CM | POA: Diagnosis not present

## 2017-01-11 DIAGNOSIS — F17218 Nicotine dependence, cigarettes, with other nicotine-induced disorders: Secondary | ICD-10-CM

## 2017-01-11 DIAGNOSIS — M5416 Radiculopathy, lumbar region: Secondary | ICD-10-CM

## 2017-01-11 DIAGNOSIS — M48061 Spinal stenosis, lumbar region without neurogenic claudication: Secondary | ICD-10-CM | POA: Diagnosis not present

## 2017-01-11 DIAGNOSIS — M542 Cervicalgia: Secondary | ICD-10-CM

## 2017-01-11 HISTORY — DX: Cervicalgia: M54.2

## 2017-01-11 LAB — BASIC METABOLIC PANEL
BUN: 22 mg/dL (ref 7–25)
CALCIUM: 9.5 mg/dL (ref 8.6–10.4)
CO2: 29 mmol/L (ref 20–31)
CREATININE: 1.43 mg/dL — AB (ref 0.60–0.93)
Chloride: 104 mmol/L (ref 98–110)
GLUCOSE: 116 mg/dL — AB (ref 65–99)
Potassium: 4.5 mmol/L (ref 3.5–5.3)
Sodium: 139 mmol/L (ref 135–146)

## 2017-01-11 MED ORDER — PREDNISONE 5 MG PO TABS: 5.0000 mg | ORAL_TABLET | Freq: Two times a day (BID) | ORAL | 0 refills | Status: AC

## 2017-01-11 MED ORDER — METHYLPREDNISOLONE ACETATE 80 MG/ML IJ SUSP
80.0000 mg | Freq: Once | INTRAMUSCULAR | Status: AC
  Administered 2017-01-11: 80 mg via INTRAMUSCULAR

## 2017-01-11 NOTE — Patient Instructions (Addendum)
Wellness visit August 9 are to call if he needs me sooner.  Urine sent today from the office for further  for testing.  Depo-Medrol 80 mg IM in office followed by 5 day course of prednisone for neck and right upper extremity pain.    you are referred to pain specialist in [place].  You are referred for in-home physical therapy twice weekly for 4 weeks because of repeated falls.  Continue working on smoking cessation nicotine is toxic to your health. Call 1 800 quit now for help.  Labs today with  kidney function and thyroid function.  Please call a stomach specialist to schedule your colonoscopy, you are provided with a letter which is sent to you thank you   Steps to Quit Smoking Smoking tobacco can be bad for your health. It can also affect almost every organ in your body. Smoking puts you and people around you at risk for many serious long-lasting (chronic) diseases. Quitting smoking is hard, but it is one of the best things that you can do for your health. It is never too late to quit. What are the benefits of quitting smoking? When you quit smoking, you lower your risk for getting serious diseases and conditions. They can include:  Lung cancer or lung disease.  Heart disease.  Stroke.  Heart attack.  Not being able to have children (infertility).  Weak bones (osteoporosis) and broken bones (fractures). If you have coughing, wheezing, and shortness of breath, those symptoms may get better when you quit. You may also get sick less often. If you are pregnant, quitting smoking can help to lower your chances of having a baby of low birth weight. What can I do to help me quit smoking? Talk with your doctor about what can help you quit smoking. Some things you can do (strategies) include:  Quitting smoking totally, instead of slowly cutting back how much you smoke over a period of time.  Going to in-person counseling. You are more likely to quit if you go to many counseling  sessions.  Using resources and support systems, such as:  Online chats with a Social worker.  Phone quitlines.  Printed Furniture conservator/restorer.  Support groups or group counseling.  Text messaging programs.  Mobile phone apps or applications.  Taking medicines. Some of these medicines may have nicotine in them. If you are pregnant or breastfeeding, do not take any medicines to quit smoking unless your doctor says it is okay. Talk with your doctor about counseling or other things that can help you. Talk with your doctor about using more than one strategy at the same time, such as taking medicines while you are also going to in-person counseling. This can help make quitting easier. What things can I do to make it easier to quit? Quitting smoking might feel very hard at first, but there is a lot that you can do to make it easier. Take these steps:  Talk to your family and friends. Ask them to support and encourage you.  Call phone quitlines, reach out to support groups, or work with a Social worker.  Ask people who smoke to not smoke around you.  Avoid places that make you want (trigger) to smoke, such as:  Bars.  Parties.  Smoke-break areas at work.  Spend time with people who do not smoke.  Lower the stress in your life. Stress can make you want to smoke. Try these things to help your stress:  Getting regular exercise.  Deep-breathing exercises.  Yoga.  Meditating.  Doing a body scan. To do this, close your eyes, focus on one area of your body at a time from head to toe, and notice which parts of your body are tense. Try to relax the muscles in those areas.  Download or buy apps on your mobile phone or tablet that can help you stick to your quit plan. There are many free apps, such as QuitGuide from the [employer] Office manager for Disease Control and Prevention). You can find more support from smokefree.gov and other websites. This information is not intended to replace advice given to you  by your health care provider. Make sure you discuss any questions you have with your health care provider. Document Released: 08/29/2009 Document Revised: 06/30/2016 Document Reviewed: 03/19/2015 Elsevier Interactive Patient Education  2017 [employer].

## 2017-01-11 NOTE — Assessment & Plan Note (Signed)
Updated lab needed at/ before next visit. Over corrected when last checked

## 2017-01-11 NOTE — Assessment & Plan Note (Signed)
Tammy Suarez is reminded of the importance of commitment to daily physical activity for 30 minutes or more, as able and the need to limit carbohydrate intake to 30 to 60 grams per meal to help with blood sugar control.   Updated lab needed at/ before next visit.   Tammy Suarez is reminded of the importance of daily foot exam, annual eye examination, and good blood sugar, blood pressure and cholesterol control.  Diabetic Labs Latest Ref Rng & Units 01/11/2017 11/26/2016 08/05/2016 07/17/2016 06/17/2016  HbA1c <5.7 % - 6.3(H) 6.5(H) - -  Microalbumin Not estab mg/dL - - - - -  Micro/Creat Ratio <30 mcg/mg creat - - - - -  Chol <200 mg/dL - 161 144 - -  HDL >50 mg/dL - 30(L) 36(L) - -  Calc LDL <100 mg/dL - 88 78 - -  Triglycerides <150 mg/dL - 216(H) 152(H) - -  Creatinine 0.60 - 0.93 mg/dL 1.43(H) 1.84(H) 1.38(H) 1.47(H) 1.50(H)   BP/Weight 01/11/2017 12/07/2016 09/09/2016 08/12/2016 08/05/2016 8/54/6270 01/19/92  Systolic BP 818 299 371 696 789 381 017  Diastolic BP 80 64 72 75 71 75 92  Wt. (Lbs) 199.4 199 199.4 195 195 200 -  BMI 33.18 33.12 36.47 35.67 34 34.87 -   Foot/eye exam completion dates Latest Ref Rng & Units 09/09/2016 02/05/2016  Eye Exam No Retinopathy - No Retinopathy  Foot exam Order - - -  Foot Form Completion - Done -

## 2017-01-11 NOTE — Progress Notes (Signed)
Tammy Suarez     MRN: [id number]      DOB: Sep 27, 1946   HPI Ms. Ehrsam is here for follow up and re-evaluation of chronic medical conditions, medication management and review of any available recent lab and radiology data.  Preventive health is updated, specifically  Cancer screening and Immunization.   Questions or concerns regarding consultations or procedures which the PT has had in the interim are  addressed. The PT denies any adverse reactions to current medications since the last visit.  C/o uncontrolled generalized pain , wants referral to specialist C/o ongoing stress , de[pression and anxiety states children and grand children treat her as if it's time for her to get into the casket, no interest in therapy or psych eval, tho states her only reason for living is a 71 yr old boy she recently adopted, not suicidal or homicidal ROS Denies recent fever or chills. Denies sinus pressure, nasal congestion, ear pain or sore throat. Denies chest congestion, productive cough or wheezing. Denies chest pains, palpitations and leg swelling Denies abdominal pain, nausea, vomiting,diarrhea or constipation.   Denies dysuria, frequency, hesitancy or incontinence.  Denies headaches, seizures, Denies skin break down or rash.   PE  BP (!) 150/80   Pulse 71   Resp 15   Ht 5\' 5"  (1.651 m)   Wt 199 lb 6.4 oz (90.4 kg)   SpO2 98%   BMI 33.18 kg/m   Patient alert and oriented and in no cardiopulmonary distress. Tearful at imes  HEENT: No facial asymmetry, EOMI,   oropharynx pink and moist.  Neck decreased ROM no JVD, no mass.  Chest: Clear to auscultation bilaterally.Decreased air entry  bilaterally  CVS: S1, S2 no murmurs, no S3.Regular rate.  ABD: Soft non tender.   Ext: No edema  MS: Decreased ROM spine, shoulders, hips and knees.  Skin: Intact, no ulcerations or rash noted.  Psych: Good eye contact, normal affect. Memory intact not anxious or depressed appearing.  CNS: CN  2-12 intact, power,  normal throughout.no focal deficits noted.   Assessment & Plan  Neck pain on right side Increased , worsening, uncontrolled. Depo medrol in office followed by 5 day course of prednisone  Recurrent falls C/o unsteady gait and recurrent falls and near falls in the home, refer for in home PT twice weekly for 4 to 6 weeks  Hypothyroidism Updated lab needed at/ before next visit. Over corrected when last checked  Morbid obesity Unchanged Patient re-educated about  the importance of commitment to a  minimum of 150 minutes of exercise per week.  The importance of healthy food choices with portion control discussed. Encouraged to start a food diary, count calories and to consider  joining a support group. Sample diet sheets offered. Goals set by the patient for the next several months.   Weight /BMI 01/11/2017 12/07/2016 09/09/2016  WEIGHT 199 lb 6.4 oz 199 lb 199 lb 6.4 oz  HEIGHT 5\' 5"  5\' 5"  5\' 2"   BMI 33.18 kg/m2 33.12 kg/m2 36.47 kg/m2      DM (diabetes mellitus), type 2 with renal complications Ms. Biswell is reminded of the importance of commitment to daily physical activity for 30 minutes or more, as able and the need to limit carbohydrate intake to 30 to 60 grams per meal to help with blood sugar control.   Updated lab needed at/ before next visit.   Ms. Buzan is reminded of the importance of daily foot exam, annual eye examination, and good blood  sugar, blood pressure and cholesterol control.  Diabetic Labs Latest Ref Rng & Units 01/11/2017 11/26/2016 08/05/2016 07/17/2016 06/17/2016  HbA1c <5.7 % - 6.3(H) 6.5(H) - -  Microalbumin Not estab mg/dL - - - - -  Micro/Creat Ratio <30 mcg/mg creat - - - - -  Chol <200 mg/dL - 161 144 - -  HDL >50 mg/dL - 30(L) 36(L) - -  Calc LDL <100 mg/dL - 88 78 - -  Triglycerides <150 mg/dL - 216(H) 152(H) - -  Creatinine 0.60 - 0.93 mg/dL 1.43(H) 1.84(H) 1.38(H) 1.47(H) 1.50(H)   BP/Weight 01/11/2017 12/07/2016 09/09/2016  08/12/2016 08/05/2016 9/52/8413 12/20/4008  Systolic BP 272 536 644 034 742 595 638  Diastolic BP 80 64 72 75 71 75 92  Wt. (Lbs) 199.4 199 199.4 195 195 200 -  BMI 33.18 33.12 36.47 35.67 34 34.87 -   Foot/eye exam completion dates Latest Ref Rng & Units 09/09/2016 02/05/2016  Eye Exam No Retinopathy - No Retinopathy  Foot exam Order - - -  Foot Form Completion - Done -        Nicotine dependence Patient counseled for approximately 5 minutes regarding the health risks of ongoing nicotine use, specifically all types of cancer, heart disease, stroke and respiratory failure. The options available for help with cessation ,the behavioral changes to assist the process, and the option to either gradully reduce usage  Or abruptly stop.is also discussed. Pt is also encouraged to set specific goals in number of cigarettes used daily, as well as to set a quit date.

## 2017-01-11 NOTE — Assessment & Plan Note (Signed)
C/o unsteady gait and recurrent falls and near falls in the home, refer for in home PT twice weekly for 4 to 6 weeks

## 2017-01-11 NOTE — Assessment & Plan Note (Signed)
Increased , worsening, uncontrolled. Depo medrol in office followed by 5 day course of prednisone

## 2017-01-11 NOTE — Assessment & Plan Note (Signed)
Unchanged Patient re-educated about  the importance of commitment to a  minimum of 150 minutes of exercise per week.  The importance of healthy food choices with portion control discussed. Encouraged to start a food diary, count calories and to consider  joining a support group. Sample diet sheets offered. Goals set by the patient for the next several months.   Weight /BMI 01/11/2017 12/07/2016 09/09/2016  WEIGHT 199 lb 6.4 oz 199 lb 199 lb 6.4 oz  HEIGHT 5\' 5"  5\' 5"  5\' 2"   BMI 33.18 kg/m2 33.12 kg/m2 36.47 kg/m2

## 2017-01-11 NOTE — Assessment & Plan Note (Signed)

## 2017-01-12 DIAGNOSIS — E785 Hyperlipidemia, unspecified: Secondary | ICD-10-CM | POA: Diagnosis not present

## 2017-01-12 DIAGNOSIS — M542 Cervicalgia: Secondary | ICD-10-CM | POA: Diagnosis not present

## 2017-01-12 DIAGNOSIS — Z9181 History of falling: Secondary | ICD-10-CM | POA: Diagnosis not present

## 2017-01-12 DIAGNOSIS — F418 Other specified anxiety disorders: Secondary | ICD-10-CM | POA: Diagnosis not present

## 2017-01-12 DIAGNOSIS — K219 Gastro-esophageal reflux disease without esophagitis: Secondary | ICD-10-CM | POA: Diagnosis not present

## 2017-01-12 DIAGNOSIS — E039 Hypothyroidism, unspecified: Secondary | ICD-10-CM | POA: Diagnosis not present

## 2017-01-12 DIAGNOSIS — I129 Hypertensive chronic kidney disease with stage 1 through stage 4 chronic kidney disease, or unspecified chronic kidney disease: Secondary | ICD-10-CM | POA: Diagnosis not present

## 2017-01-12 DIAGNOSIS — F17200 Nicotine dependence, unspecified, uncomplicated: Secondary | ICD-10-CM | POA: Diagnosis not present

## 2017-01-12 DIAGNOSIS — N183 Chronic kidney disease, stage 3 (moderate): Secondary | ICD-10-CM | POA: Diagnosis not present

## 2017-01-12 DIAGNOSIS — E1122 Type 2 diabetes mellitus with diabetic chronic kidney disease: Secondary | ICD-10-CM | POA: Diagnosis not present

## 2017-01-12 DIAGNOSIS — R296 Repeated falls: Secondary | ICD-10-CM | POA: Diagnosis not present

## 2017-01-12 DIAGNOSIS — M48062 Spinal stenosis, lumbar region with neurogenic claudication: Secondary | ICD-10-CM | POA: Diagnosis not present

## 2017-01-12 DIAGNOSIS — I2510 Atherosclerotic heart disease of native coronary artery without angina pectoris: Secondary | ICD-10-CM | POA: Diagnosis not present

## 2017-01-12 LAB — MICROALBUMIN / CREATININE URINE RATIO
Creatinine, Urine: 91 mg/dL (ref 20–320)
Microalb Creat Ratio: 49 mcg/mg creat — ABNORMAL HIGH (ref <30)
Microalb, Ur: 4.5 mg/dL

## 2017-01-12 LAB — TSH: TSH: 1.72 mIU/L

## 2017-01-14 ENCOUNTER — Other Ambulatory Visit: Payer: Self-pay | Admitting: Family Medicine

## 2017-01-14 DIAGNOSIS — I129 Hypertensive chronic kidney disease with stage 1 through stage 4 chronic kidney disease, or unspecified chronic kidney disease: Secondary | ICD-10-CM | POA: Diagnosis not present

## 2017-01-14 DIAGNOSIS — M48062 Spinal stenosis, lumbar region with neurogenic claudication: Secondary | ICD-10-CM | POA: Diagnosis not present

## 2017-01-14 DIAGNOSIS — E1122 Type 2 diabetes mellitus with diabetic chronic kidney disease: Secondary | ICD-10-CM | POA: Diagnosis not present

## 2017-01-14 DIAGNOSIS — N183 Chronic kidney disease, stage 3 (moderate): Secondary | ICD-10-CM | POA: Diagnosis not present

## 2017-01-14 DIAGNOSIS — M542 Cervicalgia: Secondary | ICD-10-CM | POA: Diagnosis not present

## 2017-01-14 DIAGNOSIS — I2510 Atherosclerotic heart disease of native coronary artery without angina pectoris: Secondary | ICD-10-CM | POA: Diagnosis not present

## 2017-01-17 ENCOUNTER — Other Ambulatory Visit: Payer: Self-pay | Admitting: Family Medicine

## 2017-01-26 ENCOUNTER — Telehealth: Payer: Self-pay | Admitting: Family Medicine

## 2017-01-26 DIAGNOSIS — M48062 Spinal stenosis, lumbar region with neurogenic claudication: Secondary | ICD-10-CM | POA: Diagnosis not present

## 2017-01-26 DIAGNOSIS — I129 Hypertensive chronic kidney disease with stage 1 through stage 4 chronic kidney disease, or unspecified chronic kidney disease: Secondary | ICD-10-CM | POA: Diagnosis not present

## 2017-01-26 DIAGNOSIS — M542 Cervicalgia: Secondary | ICD-10-CM | POA: Diagnosis not present

## 2017-01-26 DIAGNOSIS — N183 Chronic kidney disease, stage 3 (moderate): Secondary | ICD-10-CM | POA: Diagnosis not present

## 2017-01-26 DIAGNOSIS — E1122 Type 2 diabetes mellitus with diabetic chronic kidney disease: Secondary | ICD-10-CM | POA: Diagnosis not present

## 2017-01-26 DIAGNOSIS — I2510 Atherosclerotic heart disease of native coronary artery without angina pectoris: Secondary | ICD-10-CM | POA: Diagnosis not present

## 2017-01-26 NOTE — Telephone Encounter (Signed)
Tammy Suarez is calling asking for a refill on her medication clonazePAM (KLONOPIN) 1 MG tablet  Please advise?

## 2017-01-28 DIAGNOSIS — I129 Hypertensive chronic kidney disease with stage 1 through stage 4 chronic kidney disease, or unspecified chronic kidney disease: Secondary | ICD-10-CM | POA: Diagnosis not present

## 2017-01-28 DIAGNOSIS — I2510 Atherosclerotic heart disease of native coronary artery without angina pectoris: Secondary | ICD-10-CM | POA: Diagnosis not present

## 2017-01-28 DIAGNOSIS — M542 Cervicalgia: Secondary | ICD-10-CM | POA: Diagnosis not present

## 2017-01-28 DIAGNOSIS — E1122 Type 2 diabetes mellitus with diabetic chronic kidney disease: Secondary | ICD-10-CM | POA: Diagnosis not present

## 2017-01-28 DIAGNOSIS — N183 Chronic kidney disease, stage 3 (moderate): Secondary | ICD-10-CM | POA: Diagnosis not present

## 2017-01-28 DIAGNOSIS — M48062 Spinal stenosis, lumbar region with neurogenic claudication: Secondary | ICD-10-CM | POA: Diagnosis not present

## 2017-01-28 NOTE — Telephone Encounter (Signed)
Done

## 2017-02-02 DIAGNOSIS — E1122 Type 2 diabetes mellitus with diabetic chronic kidney disease: Secondary | ICD-10-CM | POA: Diagnosis not present

## 2017-02-02 DIAGNOSIS — I2510 Atherosclerotic heart disease of native coronary artery without angina pectoris: Secondary | ICD-10-CM | POA: Diagnosis not present

## 2017-02-02 DIAGNOSIS — N183 Chronic kidney disease, stage 3 (moderate): Secondary | ICD-10-CM | POA: Diagnosis not present

## 2017-02-02 DIAGNOSIS — M542 Cervicalgia: Secondary | ICD-10-CM | POA: Diagnosis not present

## 2017-02-02 DIAGNOSIS — I129 Hypertensive chronic kidney disease with stage 1 through stage 4 chronic kidney disease, or unspecified chronic kidney disease: Secondary | ICD-10-CM | POA: Diagnosis not present

## 2017-02-02 DIAGNOSIS — M48062 Spinal stenosis, lumbar region with neurogenic claudication: Secondary | ICD-10-CM | POA: Diagnosis not present

## 2017-02-04 DIAGNOSIS — I2510 Atherosclerotic heart disease of native coronary artery without angina pectoris: Secondary | ICD-10-CM | POA: Diagnosis not present

## 2017-02-04 DIAGNOSIS — I129 Hypertensive chronic kidney disease with stage 1 through stage 4 chronic kidney disease, or unspecified chronic kidney disease: Secondary | ICD-10-CM | POA: Diagnosis not present

## 2017-02-04 DIAGNOSIS — M48062 Spinal stenosis, lumbar region with neurogenic claudication: Secondary | ICD-10-CM | POA: Diagnosis not present

## 2017-02-04 DIAGNOSIS — M542 Cervicalgia: Secondary | ICD-10-CM | POA: Diagnosis not present

## 2017-02-04 DIAGNOSIS — N183 Chronic kidney disease, stage 3 (moderate): Secondary | ICD-10-CM | POA: Diagnosis not present

## 2017-02-04 DIAGNOSIS — E1122 Type 2 diabetes mellitus with diabetic chronic kidney disease: Secondary | ICD-10-CM | POA: Diagnosis not present

## 2017-02-09 DIAGNOSIS — M542 Cervicalgia: Secondary | ICD-10-CM | POA: Diagnosis not present

## 2017-02-09 DIAGNOSIS — E1122 Type 2 diabetes mellitus with diabetic chronic kidney disease: Secondary | ICD-10-CM | POA: Diagnosis not present

## 2017-02-09 DIAGNOSIS — I2510 Atherosclerotic heart disease of native coronary artery without angina pectoris: Secondary | ICD-10-CM | POA: Diagnosis not present

## 2017-02-09 DIAGNOSIS — M48062 Spinal stenosis, lumbar region with neurogenic claudication: Secondary | ICD-10-CM | POA: Diagnosis not present

## 2017-02-09 DIAGNOSIS — N183 Chronic kidney disease, stage 3 (moderate): Secondary | ICD-10-CM | POA: Diagnosis not present

## 2017-02-09 DIAGNOSIS — I129 Hypertensive chronic kidney disease with stage 1 through stage 4 chronic kidney disease, or unspecified chronic kidney disease: Secondary | ICD-10-CM | POA: Diagnosis not present

## 2017-02-10 ENCOUNTER — Other Ambulatory Visit: Payer: Self-pay | Admitting: Family Medicine

## 2017-02-10 DIAGNOSIS — I129 Hypertensive chronic kidney disease with stage 1 through stage 4 chronic kidney disease, or unspecified chronic kidney disease: Secondary | ICD-10-CM | POA: Diagnosis not present

## 2017-02-10 DIAGNOSIS — M48062 Spinal stenosis, lumbar region with neurogenic claudication: Secondary | ICD-10-CM | POA: Diagnosis not present

## 2017-02-10 DIAGNOSIS — E1122 Type 2 diabetes mellitus with diabetic chronic kidney disease: Secondary | ICD-10-CM | POA: Diagnosis not present

## 2017-02-10 DIAGNOSIS — N183 Chronic kidney disease, stage 3 (moderate): Secondary | ICD-10-CM | POA: Diagnosis not present

## 2017-02-10 DIAGNOSIS — M542 Cervicalgia: Secondary | ICD-10-CM | POA: Diagnosis not present

## 2017-02-10 DIAGNOSIS — I2510 Atherosclerotic heart disease of native coronary artery without angina pectoris: Secondary | ICD-10-CM | POA: Diagnosis not present

## 2017-02-22 ENCOUNTER — Other Ambulatory Visit: Payer: Self-pay | Admitting: Family Medicine

## 2017-02-23 ENCOUNTER — Other Ambulatory Visit: Payer: Self-pay

## 2017-03-15 ENCOUNTER — Other Ambulatory Visit: Payer: Self-pay | Admitting: Family Medicine

## 2017-04-07 ENCOUNTER — Other Ambulatory Visit: Payer: Self-pay | Admitting: Family Medicine

## 2017-04-21 ENCOUNTER — Other Ambulatory Visit: Payer: Self-pay | Admitting: Family Medicine

## 2017-04-22 ENCOUNTER — Encounter: Payer: Self-pay | Admitting: Family Medicine

## 2017-04-22 ENCOUNTER — Ambulatory Visit (INDEPENDENT_AMBULATORY_CARE_PROVIDER_SITE_OTHER): Payer: Medicare Other | Admitting: Family Medicine

## 2017-04-22 ENCOUNTER — Telehealth: Payer: Self-pay | Admitting: Family Medicine

## 2017-04-22 VITALS — BP 144/80 | HR 57 | Resp 16 | Ht 65.0 in | Wt 191.0 lb

## 2017-04-22 DIAGNOSIS — M25512 Pain in left shoulder: Secondary | ICD-10-CM | POA: Insufficient documentation

## 2017-04-22 DIAGNOSIS — R296 Repeated falls: Secondary | ICD-10-CM | POA: Diagnosis not present

## 2017-04-22 DIAGNOSIS — F17210 Nicotine dependence, cigarettes, uncomplicated: Secondary | ICD-10-CM | POA: Diagnosis not present

## 2017-04-22 DIAGNOSIS — M25511 Pain in right shoulder: Secondary | ICD-10-CM

## 2017-04-22 DIAGNOSIS — E038 Other specified hypothyroidism: Secondary | ICD-10-CM | POA: Diagnosis not present

## 2017-04-22 DIAGNOSIS — E1159 Type 2 diabetes mellitus with other circulatory complications: Secondary | ICD-10-CM

## 2017-04-22 DIAGNOSIS — I25709 Atherosclerosis of coronary artery bypass graft(s), unspecified, with unspecified angina pectoris: Secondary | ICD-10-CM | POA: Diagnosis not present

## 2017-04-22 DIAGNOSIS — E785 Hyperlipidemia, unspecified: Secondary | ICD-10-CM

## 2017-04-22 DIAGNOSIS — M542 Cervicalgia: Secondary | ICD-10-CM | POA: Diagnosis not present

## 2017-04-22 DIAGNOSIS — E1121 Type 2 diabetes mellitus with diabetic nephropathy: Secondary | ICD-10-CM | POA: Diagnosis not present

## 2017-04-22 DIAGNOSIS — I10 Essential (primary) hypertension: Secondary | ICD-10-CM | POA: Diagnosis not present

## 2017-04-22 DIAGNOSIS — F17208 Nicotine dependence, unspecified, with other nicotine-induced disorders: Secondary | ICD-10-CM

## 2017-04-22 HISTORY — DX: Pain in left shoulder: M25.512

## 2017-04-22 LAB — COMPLETE METABOLIC PANEL WITH GFR
ALBUMIN: 3.9 g/dL (ref 3.6–5.1)
ALK PHOS: 51 U/L (ref 33–130)
ALT: 8 U/L (ref 6–29)
AST: 16 U/L (ref 10–35)
BUN: 24 mg/dL (ref 7–25)
CALCIUM: 9.6 mg/dL (ref 8.6–10.4)
CO2: 25 mmol/L (ref 20–31)
Chloride: 105 mmol/L (ref 98–110)
Creat: 1.55 mg/dL — ABNORMAL HIGH (ref 0.60–0.93)
GFR, EST AFRICAN AMERICAN: 39 mL/min — AB (ref >=60)
GFR, Est Non African American: 33 mL/min — ABNORMAL LOW (ref >=60)
Glucose, Bld: 104 mg/dL — ABNORMAL HIGH (ref 65–99)
POTASSIUM: 4.7 mmol/L (ref 3.5–5.3)
Sodium: 138 mmol/L (ref 135–146)
Total Bilirubin: 0.4 mg/dL (ref 0.2–1.2)
Total Protein: 6.8 g/dL (ref 6.1–8.1)

## 2017-04-22 LAB — CBC
HCT: 38.3 % (ref 35.0–45.0)
Hemoglobin: 12.6 g/dL (ref 11.7–15.5)
MCH: 32 pg (ref 27.0–33.0)
MCHC: 32.9 g/dL (ref 32.0–36.0)
MCV: 97.2 fL (ref 80.0–100.0)
MPV: 10.1 fL (ref 7.5–12.5)
PLATELETS: 252 10*3/uL (ref 140–400)
RBC: 3.94 MIL/uL (ref 3.80–5.10)
RDW: 13.2 % (ref 11.0–15.0)
WBC: 5.2 10*3/uL (ref 3.8–10.8)

## 2017-04-22 LAB — LIPID PANEL
CHOL/HDL RATIO: 4.7 ratio (ref <5.0)
CHOLESTEROL: 107 mg/dL (ref <200)
HDL: 23 mg/dL — AB (ref >50)
LDL Cholesterol: 47 mg/dL (ref <100)
TRIGLYCERIDES: 185 mg/dL — AB (ref <150)
VLDL: 37 mg/dL — ABNORMAL HIGH (ref <30)

## 2017-04-22 MED ORDER — NAPROXEN 500 MG PO TABS: 500.0000 mg | ORAL_TABLET | Freq: Two times a day (BID) | ORAL | 0 refills | Status: DC

## 2017-04-22 MED ORDER — PREDNISONE 5 MG PO TABS: 5.0000 mg | ORAL_TABLET | Freq: Two times a day (BID) | ORAL | 0 refills | Status: AC

## 2017-04-22 NOTE — Assessment & Plan Note (Signed)
Tammy Suarez is reminded of the importance of commitment to daily physical activity for 30 minutes or more, as able and the need to limit carbohydrate intake to 30 to 60 grams per meal to help with blood sugar control.   Up[dated lab today  Tammy Suarez is reminded of the importance of daily foot exam, annual eye examination, and good blood sugar, blood pressure and cholesterol control.  Diabetic Labs Latest Ref Rng & Units 01/11/2017 11/26/2016 08/05/2016 07/17/2016 06/17/2016  HbA1c <5.7 % - 6.3(H) 6.5(H) - -  Microalbumin Not estab mg/dL 4.5 - - - -  Micro/Creat Ratio <30 mcg/mg creat 49(H) - - - -  Chol <200 mg/dL - 161 144 - -  HDL >50 mg/dL - 30(L) 36(L) - -  Calc LDL <100 mg/dL - 88 78 - -  Triglycerides <150 mg/dL - 216(H) 152(H) - -  Creatinine 0.60 - 0.93 mg/dL 1.43(H) 1.84(H) 1.38(H) 1.47(H) 1.50(H)   BP/Weight 04/22/2017 01/11/2017 12/07/2016 09/09/2016 08/12/2016 08/05/2016 4/78/2956  Systolic BP 213 086 578 469 629 528 413  Diastolic BP 80 80 64 72 75 71 75  Wt. (Lbs) 191 199.4 199 199.4 195 195 200  BMI 31.78 33.18 33.12 36.47 35.67 34 34.87   Foot/eye exam completion dates Latest Ref Rng & Units 09/09/2016 02/05/2016  Eye Exam No Retinopathy - No Retinopathy  Foot exam Order - - -  Foot Form Completion - Done -

## 2017-04-22 NOTE — Telephone Encounter (Signed)
Patient here now and going to lab.  Requesting lab work print out.

## 2017-04-22 NOTE — Assessment & Plan Note (Addendum)
2 week h/o right shoulder pain s//p fall with reduced rOM, diagnostic x ray  Needed Short course of anti inflammatories prescribed

## 2017-04-22 NOTE — Patient Instructions (Addendum)
Wellness with nurse early September  Cancel MD visit next week, we will call you with lab results  MD follow up mid November,call if you need me sooner  Please get x rays of neck and right shoulder as soon as possible, today preferred  Toradol and depo medrol administered in office todaty for pain and 5 day course of medications sent on for acute pain also  Please work on stopping smoking you need to [place], call [place] are referred to Dr Precious Bard for diabetic eye exam  Call to schedule your colonoscopy with Dr Laural Golden , I have re printed your letter   Steps to Quit Smoking Smoking tobacco can be bad for your health. It can also affect almost every organ in your body. Smoking puts you and people around you at risk for many serious long-lasting (chronic) diseases. Quitting smoking is hard, but it is one of the best things that you can do for your health. It is never too late to quit. What are the benefits of quitting smoking? When you quit smoking, you lower your risk for getting serious diseases and conditions. They can include:  Lung cancer or lung disease.  Heart disease.  Stroke.  Heart attack.  Not being able to have children (infertility).  Weak bones (osteoporosis) and broken bones (fractures).  If you have coughing, wheezing, and shortness of breath, those symptoms may get better when you quit. You may also get sick less often. If you are pregnant, quitting smoking can help to lower your chances of having a baby of low birth weight. What can I do to help me quit smoking? Talk with your doctor about what can help you quit smoking. Some things you can do (strategies) include:  Quitting smoking totally, instead of slowly cutting back how much you smoke over a period of time.  Going to in-person counseling. You are more likely to quit if you go to many counseling sessions.  Using resources and support systems, such as: ? Database administrator with a Social worker. ? Phone  quitlines. ? Careers information officer. ? Support groups or group counseling. ? Text messaging programs. ? Mobile phone apps or applications.  Taking medicines. Some of these medicines may have nicotine in them. If you are pregnant or breastfeeding, do not take any medicines to quit smoking unless your doctor says it is okay. Talk with your doctor about counseling or other things that can help you.  Talk with your doctor about using more than one strategy at the same time, such as taking medicines while you are also going to in-person counseling. This can help make quitting easier. What things can I do to make it easier to quit? Quitting smoking might feel very hard at first, but there is a lot that you can do to make it easier. Take these steps:  Talk to your family and friends. Ask them to support and encourage you.  Call phone quitlines, reach out to support groups, or work with a Social worker.  Ask people who smoke to not smoke around you.  Avoid places that make you want (trigger) to smoke, such as: ? Bars. ? Parties. ? Smoke-break areas at work.  Spend time with people who do not smoke.  Lower the stress in your life. Stress can make you want to smoke. Try these things to help your stress: ? Getting regular exercise. ? Deep-breathing exercises. ? Yoga. ? Meditating. ? Doing a body scan. To do this, close your eyes, focus  on one area of your body at a time from head to toe, and notice which parts of your body are tense. Try to relax the muscles in those areas.  Download or buy apps on your mobile phone or tablet that can help you stick to your quit plan. There are many free apps, such as QuitGuide from the [employer] Office manager for Disease Control and Prevention). You can find more support from smokefree.gov and other websites.  This information is not intended to replace advice given to you by your health care provider. Make sure you discuss any questions you have with your health care  provider. Document Released: 08/29/2009 Document Revised: 06/30/2016 Document Reviewed: 03/19/2015 Elsevier Interactive Patient Education  2018 [employer].

## 2017-04-22 NOTE — Assessment & Plan Note (Addendum)
Neck pain on right side following fall Uncontrolled.Toradol and depo medrol administered IM in the office , to be followed by a short course of oral prednisone and NSAIDS.

## 2017-04-23 ENCOUNTER — Ambulatory Visit (HOSPITAL_COMMUNITY)
Admission: RE | Admit: 2017-04-23 | Discharge: 2017-04-23 | Disposition: A | Discharge status: Home / Self Care | Payer: Medicare Other | Source: Ambulatory Visit | Attending: Family Medicine | Admitting: Family Medicine

## 2017-04-23 DIAGNOSIS — W19XXXA Unspecified fall, initial encounter: Secondary | ICD-10-CM | POA: Diagnosis not present

## 2017-04-23 DIAGNOSIS — M542 Cervicalgia: Secondary | ICD-10-CM

## 2017-04-23 DIAGNOSIS — M25511 Pain in right shoulder: Secondary | ICD-10-CM | POA: Diagnosis not present

## 2017-04-23 LAB — HEMOGLOBIN A1C
Hgb A1c MFr Bld: 6.2 % — ABNORMAL HIGH (ref <5.7)
MEAN PLASMA GLUCOSE: 131 mg/dL

## 2017-04-23 MED ORDER — KETOROLAC TROMETHAMINE 60 MG/2ML IM SOLN
60.0000 mg | Freq: Once | INTRAMUSCULAR | Status: AC
  Administered 2017-04-23: 60 mg via INTRAMUSCULAR

## 2017-04-23 MED ORDER — METHYLPREDNISOLONE ACETATE 80 MG/ML IJ SUSP
80.0000 mg | Freq: Once | INTRAMUSCULAR | Status: AC
  Administered 2017-04-23: 80 mg via INTRAMUSCULAR

## 2017-04-26 NOTE — Assessment & Plan Note (Signed)
Triglycerides elevated , needs to lower fried and fatty food intake

## 2017-04-26 NOTE — Assessment & Plan Note (Signed)
Controlled, no change in medication  

## 2017-04-26 NOTE — Progress Notes (Signed)
Tammy Suarez     MRN: [id number]      DOB: Mar 22, 1946   HPI Tammy Suarez is here for follow up and re-evaluation of chronic medical conditions, medication management and review of any available recent lab and radiology data.  Preventive health is updated, specifically  Cancer screening and Immunization.   Questions or concerns regarding consultations or procedures which the PT has had in the interim are  addressed. The PT denies any adverse reactions to current medications since the last visit.  C/o 2 falls in the last 2 weeks, hitting right side of neck and right shoulder states unable to lify shoulder wants this checked Also stressed due to child she is raising being recently removed from her home  ROS Denies recent fever or chills. Denies sinus pressure, nasal congestion, ear pain or sore throat. Denies chest congestion, productive cough or wheezing. Denies chest pains, palpitations and leg swelling Denies abdominal pain, nausea, vomiting,diarrhea or constipation.   Denies dysuria, frequency, hesitancy or incontinence. . Denies headaches, seizures, numbness, or tingling.  Denies skin break down or rash.   PE  BP (!) 144/80   Pulse (!) 57   Resp 16   Ht 5\' 5"  (1.651 m)   Wt 191 lb (86.6 kg)   SpO2 98%   BMI 31.78 kg/m   Patient alert and oriented and in no cardiopulmonary distress.  HEENT: No facial asymmetry, EOMI,   oropharynx pink and moist.  Neck decreased ROM no JVD, no mass.  Chest: Clear to auscultation bilaterally.  CVS: S1, S2 no murmurs, no S3.Regular rate.  ABD: Soft non tender.   Ext: No edema  OA:[id number]  ROM lumbar  Spine, and right  shoulder, adequate in hips and knees.  Skin: Intact, no ulcerations or rash noted.  Psych: Good eye contact, normal affect. Memory intact not anxious or depressed appearing.  CNS: CN 2-12 intact, power,  normal throughout.no focal deficits noted.   Assessment & Plan  Neck pain Neck pain on right side  following fall Uncontrolled.Toradol and depo medrol administered IM in the office , to be followed by a short course of oral prednisone and NSAIDS.   Shoulder pain, right 2 week h/o right shoulder pain s//p fall with reduced rOM, diagnostic x ray  Needed Short course of anti inflammatories prescribed  DM (diabetes mellitus), type 2 with renal complications Tammy Suarez is reminded of the importance of commitment to daily physical activity for 30 minutes or more, as able and the need to limit carbohydrate intake to 30 to 60 grams per meal to help with blood sugar control.   Up[dated lab today  Tammy Suarez is reminded of the importance of daily foot exam, annual eye examination, and good blood sugar, blood pressure and cholesterol control.  Diabetic Labs Latest Ref Rng & Units 01/11/2017 11/26/2016 08/05/2016 07/17/2016 06/17/2016  HbA1c <5.7 % - 6.3(H) 6.5(H) - -  Microalbumin Not estab mg/dL 4.5 - - - -  Micro/Creat Ratio <30 mcg/mg creat 49(H) - - - -  Chol <200 mg/dL - 161 144 - -  HDL >50 mg/dL - 30(L) 36(L) - -  Calc LDL <100 mg/dL - 88 78 - -  Triglycerides <150 mg/dL - 216(H) 152(H) - -  Creatinine 0.60 - 0.93 mg/dL 1.43(H) 1.84(H) 1.38(H) 1.47(H) 1.50(H)   BP/Weight 04/22/2017 01/11/2017 12/07/2016 09/09/2016 08/12/2016 08/05/2016 04/17/931  Systolic BP 355 732 202 542 706 237 628  Diastolic BP 80 80 64 72 75 71 75  Wt. (Lbs)  191 199.4 199 199.4 195 195 200  BMI 31.78 33.18 33.12 36.47 35.67 34 34.87   Foot/eye exam completion dates Latest Ref Rng & Units 09/09/2016 02/05/2016  Eye Exam No Retinopathy - No Retinopathy  Foot exam Order - - -  Foot Form Completion - Done -        Nicotine dependence Patient is asked and  confirms current  Nicotine use.  Five to seven minutes of time is spent in counseling the patient of the need to quit smoking  Advice to quit is delivered clearly specifically in reducing the risk of developing heart disease, having a stroke, or of developing all  types of cancer, especially lung and oral cancer. Improvement in breathing and exercise tolerance and quality of life is also discussed, as is the economic benefit.  Assessment of willingness to quit or to make an attempt to quit is made and documented  Assistance in quit attempt is made with several and varied options presented, based on patient's desire and need. These include  literature, local classes available, 1800 QUIT NOW number, OTC and prescription medication.  The GOAL to be NICOTINE FREE is re emphasized.  The patient has set a personal goal of either reduction or discontinuation and follow up is arranged for 16 weeks. Increased difficulty with breathing and risk of recurrent cAD is stgressed   Essential hypertension, benign Increased systolic pressure at visit , however, pt in pain and anxious, no med change DASH diet and commitment to daily physical activity for a minimum of 30 minutes discussed and encouraged, as a part of hypertension management. The importance of attaining a healthy weight is also discussed.  BP/Weight 04/22/2017 01/11/2017 12/07/2016 09/09/2016 08/12/2016 08/05/2016 01/09/4974  Systolic BP 300 511 021 117 356 701 410  Diastolic BP 80 80 64 72 75 71 75  Wt. (Lbs) 191 199.4 199 199.4 195 195 200  BMI 31.78 33.18 33.12 36.47 35.67 34 34.87       Hypothyroidism Controlled, no change in medication   Recurrent falls Home safety discussed  Hyperlipidemia with target LDL less than 70 Triglycerides elevated , needs to lower fried and fatty food intake

## 2017-04-26 NOTE — Assessment & Plan Note (Signed)
Home safety discussed 

## 2017-04-26 NOTE — Assessment & Plan Note (Signed)
Increased systolic pressure at visit , however, pt in pain and anxious, no med change DASH diet and commitment to daily physical activity for a minimum of 30 minutes discussed and encouraged, as a part of hypertension management. The importance of attaining a healthy weight is also discussed.  BP/Weight 04/22/2017 01/11/2017 12/07/2016 09/09/2016 08/12/2016 08/05/2016 4/48/1856  Systolic BP 314 970 263 785 885 027 741  Diastolic BP 80 80 64 72 75 71 75  Wt. (Lbs) 191 199.4 199 199.4 195 195 200  BMI 31.78 33.18 33.12 36.47 35.67 34 34.87

## 2017-04-26 NOTE — Assessment & Plan Note (Signed)
Patient is asked and  confirms current  Nicotine use.  Five to seven minutes of time is spent in counseling the patient of the need to quit smoking  Advice to quit is delivered clearly specifically in reducing the risk of developing heart disease, having a stroke, or of developing all types of cancer, especially lung and oral cancer. Improvement in breathing and exercise tolerance and quality of life is also discussed, as is the economic benefit.  Assessment of willingness to quit or to make an attempt to quit is made and documented  Assistance in quit attempt is made with several and varied options presented, based on patient's desire and need. These include  literature, local classes available, 1800 QUIT NOW number, OTC and prescription medication.  The GOAL to be NICOTINE FREE is re emphasized.  The patient has set a personal goal of either reduction or discontinuation and follow up is arranged for 16 weeks. Increased difficulty with breathing and risk of recurrent cAD is stgressed

## 2017-04-27 ENCOUNTER — Telehealth: Payer: Self-pay | Admitting: Family Medicine

## 2017-04-27 ENCOUNTER — Ambulatory Visit: Payer: Self-pay | Admitting: Family Medicine

## 2017-04-27 NOTE — Telephone Encounter (Signed)
Called patient to find out whom her eye doctor is .

## 2017-05-05 ENCOUNTER — Emergency Department (HOSPITAL_COMMUNITY): Payer: Medicare Other

## 2017-05-05 ENCOUNTER — Encounter (HOSPITAL_COMMUNITY): Payer: Self-pay | Admitting: Emergency Medicine

## 2017-05-05 ENCOUNTER — Emergency Department (HOSPITAL_COMMUNITY)
Admission: EM | Admit: 2017-05-05 | Discharge: 2017-05-05 | Disposition: A | Discharge status: Home Health | Payer: Medicare Other | Attending: Emergency Medicine | Admitting: Emergency Medicine

## 2017-05-05 DIAGNOSIS — M5030 Other cervical disc degeneration, unspecified cervical region: Secondary | ICD-10-CM | POA: Insufficient documentation

## 2017-05-05 DIAGNOSIS — Y939 Activity, unspecified: Secondary | ICD-10-CM | POA: Diagnosis not present

## 2017-05-05 DIAGNOSIS — M5090 Cervical disc disorder, unspecified, unspecified cervical region: Secondary | ICD-10-CM | POA: Diagnosis not present

## 2017-05-05 DIAGNOSIS — S0990XA Unspecified injury of head, initial encounter: Secondary | ICD-10-CM | POA: Diagnosis not present

## 2017-05-05 DIAGNOSIS — Z87891 Personal history of nicotine dependence: Secondary | ICD-10-CM | POA: Insufficient documentation

## 2017-05-05 DIAGNOSIS — E039 Hypothyroidism, unspecified: Secondary | ICD-10-CM | POA: Diagnosis not present

## 2017-05-05 DIAGNOSIS — Z7982 Long term (current) use of aspirin: Secondary | ICD-10-CM | POA: Diagnosis not present

## 2017-05-05 DIAGNOSIS — Z951 Presence of aortocoronary bypass graft: Secondary | ICD-10-CM | POA: Insufficient documentation

## 2017-05-05 DIAGNOSIS — S4991XA Unspecified injury of right shoulder and upper arm, initial encounter: Secondary | ICD-10-CM | POA: Diagnosis present

## 2017-05-05 DIAGNOSIS — Y999 Unspecified external cause status: Secondary | ICD-10-CM | POA: Diagnosis not present

## 2017-05-05 DIAGNOSIS — I25810 Atherosclerosis of coronary artery bypass graft(s) without angina pectoris: Secondary | ICD-10-CM | POA: Diagnosis not present

## 2017-05-05 DIAGNOSIS — I129 Hypertensive chronic kidney disease with stage 1 through stage 4 chronic kidney disease, or unspecified chronic kidney disease: Secondary | ICD-10-CM | POA: Diagnosis not present

## 2017-05-05 DIAGNOSIS — W1839XA Other fall on same level, initial encounter: Secondary | ICD-10-CM | POA: Diagnosis not present

## 2017-05-05 DIAGNOSIS — Z79899 Other long term (current) drug therapy: Secondary | ICD-10-CM | POA: Insufficient documentation

## 2017-05-05 DIAGNOSIS — Y929 Unspecified place or not applicable: Secondary | ICD-10-CM | POA: Diagnosis not present

## 2017-05-05 DIAGNOSIS — S199XXA Unspecified injury of neck, initial encounter: Secondary | ICD-10-CM | POA: Diagnosis not present

## 2017-05-05 DIAGNOSIS — S46911A Strain of unspecified muscle, fascia and tendon at shoulder and upper arm level, right arm, initial encounter: Secondary | ICD-10-CM | POA: Insufficient documentation

## 2017-05-05 DIAGNOSIS — E1122 Type 2 diabetes mellitus with diabetic chronic kidney disease: Secondary | ICD-10-CM | POA: Insufficient documentation

## 2017-05-05 DIAGNOSIS — N183 Chronic kidney disease, stage 3 (moderate): Secondary | ICD-10-CM | POA: Diagnosis not present

## 2017-05-05 MED ORDER — DEXAMETHASONE SODIUM PHOSPHATE 4 MG/ML IJ SOLN
8.0000 mg | Freq: Once | INTRAMUSCULAR | Status: AC
  Administered 2017-05-05: 8 mg via INTRAMUSCULAR
  Filled 2017-05-05: qty 2

## 2017-05-05 MED ORDER — ACETAMINOPHEN 325 MG PO TABS
650.0000 mg | ORAL_TABLET | Freq: Once | ORAL | Status: AC
  Administered 2017-05-05: 650 mg via ORAL
  Filled 2017-05-05: qty 2

## 2017-05-05 MED ORDER — HYDROCODONE-ACETAMINOPHEN 5-325 MG PO TABS: 1.0000 | ORAL_TABLET | ORAL | 0 refills | Status: DC | PRN

## 2017-05-05 MED ORDER — MELOXICAM 7.5 MG PO TABS: 7.5000 mg | ORAL_TABLET | Freq: Two times a day (BID) | ORAL | 0 refills | Status: DC

## 2017-05-05 MED ORDER — ONDANSETRON HCL 4 MG PO TABS
4.0000 mg | ORAL_TABLET | Freq: Once | ORAL | Status: AC
  Administered 2017-05-05: 4 mg via ORAL
  Filled 2017-05-05: qty 1

## 2017-05-05 NOTE — ED Triage Notes (Signed)
Pt states " I fell while I was chopping grass on a bank and fell backwards on my back.  I hit right arm and it is getting worse" Tingling in right arm.

## 2017-05-05 NOTE — ED Provider Notes (Signed)
[facility] DEPT Provider Note   CSN: [id number] Arrival date & time: 05/05/17  1311     History   Chief Complaint Chief Complaint  Patient presents with  . Shoulder Pain    HPI Tammy Suarez is a 71 y.o. female.  Patient is a 71 year old female who presents to the emergency department with a complaint of pain in the right upper extremity.  The patient states that she has had several falls, but she most recently had a fall approximately 3 or 4 days ago in which she fell backwards, she hit her head hurt her neck and her shoulder on the tongue of a trailer. She denies any loss of consciousness. She states that she had pain in her neck and shoulder. She was seen by her primary physician Dr. Moshe Cipro. She had x-rays of her cervical spine and her shoulder. The x-rays revealed arthritis, but nothing broken according to the patient. Patient states however that she received 2 shots in the doctor's office. They have not done anything for her pain according to the patient. The patient states that she gets sharp shooting pains from her shoulder down to her elbow, and she feels that there is a quotation marks nerve being pinched" on or something else bad wrong.". The patient requests evaluation of this issue. She is not dropping objects. She states however she has limited range of motion of her right shoulder because of pain.      Past Medical History:  Diagnosis Date  . Arthritis   . Carpal tunnel syndrome    bilaterally  . Chronic back pain   . CKD (chronic kidney disease) stage 3, GFR 30-59 ml/min   . Coronary atherosclerosis of native coronary artery    Multivessel status post CABG - LIMA to LAD, SVG to OM and SVG to PDA  . Depression   . Essential hypertension, benign   . GERD (gastroesophageal reflux disease)   . Headache   . History of conjunctivitis   . History of hiatal hernia   . Hyperlipidemia   . Hypothyroidism   . Myocardial infarction ([place])   . Obesity   . Vitamin B  12 deficiency     Patient Active Problem List   Diagnosis Date Noted  . Shoulder pain, right 04/22/2017  . Neck pain 01/11/2017  . Carpal tunnel syndrome, left   . Benign mole 10/02/2015  . Left carpal tunnel syndrome 12/04/2014  . Coronary atherosclerosis of native coronary artery 11/02/2013  . CKD (chronic kidney disease) stage 3, GFR 30-59 ml/min   . DM (diabetes mellitus), type 2 with renal complications ([place]) 41/74/0814  . Recurrent falls 07/28/2013  . Urinary incontinence 03/23/2013  . Nicotine dependence 11/24/2012  . Hypothyroidism 09/28/2010  . Depression with anxiety 09/28/2010  . FATIGUE 02/08/2009  . VITAMIN B12 DEFICIENCY 12/09/2007  . Hyperlipidemia with target LDL less than 70 12/09/2007  . Morbid obesity ([place]) 12/09/2007  . Essential hypertension, benign 12/09/2007  . GERD 12/09/2007  . Spinal stenosis of lumbar region with radiculopathy 12/09/2007    Past Surgical History:  Procedure Laterality Date  . ABDOMINAL HYSTERECTOMY    . BACK SURGERY    . CARDIAC SURGERY    . CARPAL TUNNEL RELEASE Left 07/23/2016   Procedure: CARPAL TUNNEL RELEASE;  Surgeon: Carole Civil, MD;  Location: [facility];  Service: Orthopedics;  Laterality: Left;  . CERVICAL LAMINECTOMY    . CHOLECYSTECTOMY    . CORONARY ARTERY BYPASS GRAFT N/A 11/02/2013   Procedure: CORONARY ARTERY  BYPASS GRAFTING (CABG);  Surgeon: Gaye Pollack, MD;  Location: [facility];  Service: Open Heart Surgery;  Laterality: N/A;  CABG x three, using left internal mammary artery and right leg greater saphenous vein harvested endoscopically  . COSMETIC SURGERY  07/01/2012   Recurrent skin infection on lower abdomen, [facility]  . HERNIA REPAIR    . Inguinal herniorrhaphy    . INTRAOPERATIVE TRANSESOPHAGEAL ECHOCARDIOGRAM N/A 11/02/2013   Procedure: INTRAOPERATIVE TRANSESOPHAGEAL ECHOCARDIOGRAM;  Surgeon: Gaye Pollack, MD;  Location: [facility] OR;  Service: Open Heart Surgery;  Laterality: N/A;  . LAPAROSCOPIC GASTRIC BANDING   2009  . LEFT HEART CATHETERIZATION WITH CORONARY ANGIOGRAM N/A 10/30/2013   Procedure: LEFT HEART CATHETERIZATION WITH CORONARY ANGIOGRAM;  Surgeon: Minus Breeding, MD;  Location: [facility] CATH LAB;  Service: Cardiovascular;  Laterality: N/A;  . Left index finger repair    . LUMBAR FUSION    . SPINE SURGERY    . VESICOVAGINAL FISTULA CLOSURE W/ TAH      OB History    Gravida Para Term Preterm AB Living   3 3 3     1    SAB TAB Ectopic Multiple Live Births                   Home Medications    Prior to Admission medications   Medication Sig Start Date End Date Taking? Authorizing Provider  amitriptyline (ELAVIL) 25 MG tablet TAKE 1 TABLET BY MOUTH AT BEDTIME 02/10/17   Fayrene Helper, MD  Aspirin-Acetaminophen-Caffeine (GOODY HEADACHE PO) Take 1 Package by mouth 2 (two) times daily as needed (headache).    [provider]  buPROPion (WELLBUTRIN XL) 300 MG 24 hr tablet TAKE 1 TABLET BY MOUTH EVERY MORNING 04/07/17   Fayrene Helper, MD  clonazePAM (KLONOPIN) 1 MG tablet TAKE 1 TABLET BY MOUTH 3 TIMES A DAY 01/27/17   Fayrene Helper, MD  DULoxetine (CYMBALTA) 60 MG capsule TAKE ONE CAPSULE BY MOUTH TWICE A DAY 12/07/16   Fayrene Helper, MD  fenofibrate (TRICOR) 145 MG tablet TAKE 1 TABLET BY MOUTH EVERY DAY **12/23 03/15/17   Fayrene Helper, MD  furosemide (LASIX) 20 MG tablet Take daily as needed for leg swelling Patient taking differently: Take 20 mg by mouth daily as needed for fluid. Take daily as needed for leg swelling 04/26/15   Satira Sark, MD  levothyroxine (SYNTHROID, LEVOTHROID) 100 MCG tablet Take 1 tablet (100 mcg total) by mouth daily. 12/04/16   Fayrene Helper, MD  loratadine (CLARITIN) 10 MG tablet Take 1 tablet (10 mg total) by mouth daily as needed for allergies. 12/04/14   Fayrene Helper, MD  metoprolol succinate (TOPROL-XL) 25 MG 24 hr tablet TAKE 1 TABLET BY MOUTH DAILY 04/07/17   Fayrene Helper, MD  Multiple Vitamin  (MULTIVITAMIN) tablet Take 1 tablet by mouth daily.    [provider]  naproxen (NAPROSYN) 500 MG tablet Take 1 tablet (500 mg total) by mouth 2 (two) times daily with a meal. 04/22/17   Fayrene Helper, MD  nitroGLYCERIN (NITROSTAT) 0.4 MG SL tablet PLACE 1 TABLET UNDER THE TONGUE EVERY 5 MINUTES AS NEEDED FOR CHEST PAIN 03/30/16   Satira Sark, MD  Omega-3 Fatty Acids (FISH OIL PO) Take 1 capsule by mouth daily.    [provider]  oxybutynin (DITROPAN-XL) 10 MG 24 hr tablet Take 1 tablet (10 mg total) by mouth at bedtime. 09/09/16   Fayrene Helper, MD  ranitidine Keturah Shavers)  150 MG tablet TAKE 1 TABLET BY MOUTH TWICE A DAY 01/18/17   Fayrene Helper, MD  rosuvastatin (CRESTOR) 40 MG tablet TAKE 1 TABLET (40 MG TOTAL) BY MOUTH DAILY. 04/21/17   Fayrene Helper, MD    Family History Family History  Problem Relation Age of Onset  . Heart attack Mother   . Heart attack Father   . Hypertension Sister   . Heart attack Sister   . Cancer Brother        Renal cell     Social History Social History  Substance Use Topics  . Smoking status: Former Smoker    Types: Cigarettes    Quit date: 05/03/2017  . Smokeless tobacco: Former Systems developer    Quit date: 11/14/2014     Comment: pt states I quit on 05/03/17  . Alcohol use No     Allergies   Gabapentin; Meperidine hcl; Niacin; and Propoxyphene n-acetaminophen   Review of Systems Review of Systems  Constitutional: Negative for activity change.       All ROS Neg except as noted in HPI  HENT: Negative for nosebleeds.   Eyes: Negative for photophobia and discharge.  Respiratory: Negative for cough, shortness of breath and wheezing.   Cardiovascular: Negative for chest pain and palpitations.  Gastrointestinal: Negative for abdominal pain and blood in stool.  Genitourinary: Negative for dysuria, frequency and hematuria.  Musculoskeletal: Positive for arthralgias and neck pain. Negative for back pain.  Skin:  Negative.   Neurological: Negative for dizziness, seizures and speech difficulty.  Psychiatric/Behavioral: Negative for confusion and hallucinations.     Physical Exam Updated Vital Signs BP (!) 141/103 (BP Location: Left Arm)   Pulse 72   Temp 98 F (36.7 C) (Oral)   Resp 18   Ht 5\' 5"  (1.651 m)   Wt 86.6 kg (191 lb)   SpO2 98%   BMI 31.78 kg/m   Physical Exam  Constitutional: Vital signs are normal. She appears well-developed and well-nourished. She is active.  HENT:  Head: Normocephalic and atraumatic.  Right Ear: Tympanic membrane, external ear and ear canal normal.  Left Ear: Tympanic membrane, external ear and ear canal normal.  Nose: Nose normal.  Mouth/Throat: Uvula is midline, oropharynx is clear and moist and mucous membranes are normal.  There is soreness to palpation of the occipital area of the scalp. There is a negative Battle's sign.  Eyes: Conjunctivae, EOM and lids are normal. Pupils are equal, round, and reactive to light.  Neck: Trachea normal, normal range of motion and phonation normal. Neck supple. Carotid bruit is not present.  Cardiovascular: Normal rate, regular rhythm and normal pulses.   Abdominal: Soft. Normal appearance and bowel sounds are normal.  Musculoskeletal:       Right shoulder: She exhibits decreased range of motion, tenderness and pain.       Arms: There is soreness with attempted range of motion of the neck. There is paraspinal area tenderness right greater than left. There is no palpable step off of the cervical, or thoracic spine.  There is limited range of motion of the right shoulder. There is full range of motion of the right elbow and wrist. There is no deformity of the right shoulder, elbow, wrist, or hand on the right.  Lymphadenopathy:       Head (right side): No submental, no preauricular and no posterior auricular adenopathy present.       Head (left side): No submental, no preauricular and no posterior auricular adenopathy  present.    She has no cervical adenopathy.  Neurological: She is alert. She has normal strength. No cranial nerve deficit or sensory deficit. GCS eye subscore is 4. GCS verbal subscore is 5. GCS motor subscore is 6.  The grip is symmetrical. The coordination of the fingers and wrist is normal. I cannot examine for shoulder strength. As the patient has pain with attempting to lift her arm, and states it hurts too much to abduct or adduct .  Skin: Skin is warm and dry.  Psychiatric: Her speech is normal.     ED Treatments / Results  Labs (all labs ordered are listed, but only abnormal results are displayed) Labs Reviewed - No data to display  EKG  EKG Interpretation None       Radiology No results found.  Procedures Procedures (including critical care time)  Medications Ordered in ED Medications - No data to display   Initial Impression / Assessment and Plan / ED Course Patient seen with me by Dr. Regenia Skeeter   I have reviewed the triage vital signs and the nursing notes.  Pertinent labs & imaging results that were available during my care of the patient were reviewed by me and considered in my medical decision making (see chart for details).       Final Clinical Impressions(s) / ED Diagnoses MDM Blood pressure is elevated at 141/103, the remainder the vital signs within normal limits. I have reviewed the x-rays of the cervical spine in the right shoulder. No fracture appreciated. Multiple areas of degenerative joint disease appreciated.  During the examination the patient has a cold shooting pain from the shoulder down to the elbow with certain movements and sometimes even while just at rest. Will obtain a CT scan of the head and neck to rule out silent fracture. Patient may need outpatient  MR to further evaluate for more severe spinal  issue.   Patient treated with intramuscular Decadron and oral Tylenol. (Patient is driving)  CT cervical spine reveals degenerative  disc disease changes from C4-C5 to C7-T1 with disc space narrowing and anterior spurring noted. The CT scan of the head is negative for skull fracture, also negative for any intracranial on injury.  Rx for mobic and norco given to the patient. Pt referred to orthopedic for evaluation of the shoulder and neck pain. Pt to return immediately to ED if any changes or problems.   Final diagnoses:  Strain of right shoulder, initial encounter  DDD (degenerative disc disease), cervical    New Prescriptions Discharge Medication List as of 05/05/2017  4:05 PM    START taking these medications   Details  HYDROcodone-acetaminophen (NORCO/VICODIN) 5-325 MG tablet Take 1 tablet by mouth every 4 (four) hours as needed., Starting Wed 05/05/2017, Print    meloxicam (MOBIC) 7.5 MG tablet Take 1 tablet (7.5 mg total) by mouth 2 (two) times daily. Take with a meal, Starting Wed 05/05/2017, Print         Lily Kocher, PA-C 05/06/17 1410    Sherwood Gambler, MD 05/10/17 [phone]

## 2017-05-05 NOTE — Discharge Instructions (Signed)
Your vital signs reveal the blood pressure be elevated at 141/103, otherwise the vital signs within normal limits. There is increased pain and decreased range of motion of your right shoulder. Please see Dr. Aline Brochure, or the orthopedic specialist of your choice concerning this. There is question as to whether or not you may have some injury to your rotator cuff. The CT scan of your cervical spine shows significant degenerative disc disease from C4 down through T1. Please discuss this with Dr. Aline Brochure also. Heating pad to your neck and shoulder area will be helpful. Use mobile 2 times daily with a meal. May use Norco for more severe pain. Norco may cause drowsiness, please do not drive a vehicle, operating machinery, drink alcohol, or participate in activities requiring concentration when taking this medication. Please take these medications with you to Dr. Griffin Dakin office so that they may become a part of your medical record.

## 2017-05-31 ENCOUNTER — Other Ambulatory Visit: Payer: Self-pay | Admitting: Cardiology

## 2017-06-01 ENCOUNTER — Other Ambulatory Visit: Payer: Self-pay | Admitting: Family Medicine

## 2017-06-01 ENCOUNTER — Telehealth: Payer: Self-pay | Admitting: Family Medicine

## 2017-06-01 NOTE — Telephone Encounter (Signed)
pls contact pt, she needs to continue her current dose of thyroid medication ,pls send me a message of current dose so it can be accurately documented for prescription refill request

## 2017-06-02 ENCOUNTER — Other Ambulatory Visit: Payer: Self-pay | Admitting: Family Medicine

## 2017-06-02 ENCOUNTER — Other Ambulatory Visit: Payer: Self-pay

## 2017-06-02 MED ORDER — LEVOTHYROXINE SODIUM 112 MCG PO TABS: 112.0000 ug | ORAL_TABLET | Freq: Every day | ORAL | 1 refills | Status: DC

## 2017-06-02 NOTE — Telephone Encounter (Signed)
Thanks ,  I will send the med

## 2017-06-02 NOTE — Telephone Encounter (Signed)
Patient states she is taking 140mcg of levothyroxine daily.

## 2017-06-02 NOTE — Addendum Note (Signed)
Addended by: Merceda Elks on: 06/02/2017 01:22 PM   Modules accepted: Orders

## 2017-06-10 ENCOUNTER — Telehealth: Payer: Self-pay

## 2017-06-10 NOTE — Telephone Encounter (Signed)
Called pt to schedule Medicare Annual Wellness Visit. -nr  

## 2017-06-13 NOTE — Progress Notes (Signed)
Cardiology Office Note  Date: 06/14/2017   ID: Tammy Suarez, DOB 12-11-1945, MRN [id number]  PCP: Fayrene Helper, MD  Primary Cardiologist: Rozann Lesches, MD   Chief Complaint  Patient presents with  . Coronary Artery Disease    History of Present Illness: Tammy Suarez is a 71 y.o. female last seen in January. She presents for a routine follow-up visit today. Reports single use of nitroglycerin in the last 6 months. Remains functional with ADLs including yard work.  I reviewed her medications which are outlined below. She also takes a baby aspirin daily. Recent lipid profile is noted below. She reports no intolerances on Crestor.  I personally reviewed her ECG from today which shows sinus bradycardia with low voltage in the precordial leads and decreased R wave progression.  She continues to follow regularly with Dr. Moshe Cipro.  Past Medical History:  Diagnosis Date  . Arthritis   . Carpal tunnel syndrome    bilaterally  . Chronic back pain   . CKD (chronic kidney disease) stage 3, GFR 30-59 ml/min   . Coronary atherosclerosis of native coronary artery    Multivessel status post CABG - LIMA to LAD, SVG to OM and SVG to PDA  . Depression   . Essential hypertension, benign   . GERD (gastroesophageal reflux disease)   . Headache   . History of conjunctivitis   . History of hiatal hernia   . Hyperlipidemia   . Hypothyroidism   . Myocardial infarction ([place])   . Obesity   . Vitamin B 12 deficiency     Past Surgical History:  Procedure Laterality Date  . ABDOMINAL HYSTERECTOMY    . BACK SURGERY    . CARDIAC SURGERY    . CARPAL TUNNEL RELEASE Left 07/23/2016   Procedure: CARPAL TUNNEL RELEASE;  Surgeon: Carole Civil, MD;  Location: [facility];  Service: Orthopedics;  Laterality: Left;  . CERVICAL LAMINECTOMY    . CHOLECYSTECTOMY    . CORONARY ARTERY BYPASS GRAFT N/A 11/02/2013   Procedure: CORONARY ARTERY BYPASS GRAFTING (CABG);  Surgeon: Gaye Pollack,  MD;  Location: [facility];  Service: Open Heart Surgery;  Laterality: N/A;  CABG x three, using left internal mammary artery and right leg greater saphenous vein harvested endoscopically  . COSMETIC SURGERY  07/01/2012   Recurrent skin infection on lower abdomen, [facility]  . HERNIA REPAIR    . Inguinal herniorrhaphy    . INTRAOPERATIVE TRANSESOPHAGEAL ECHOCARDIOGRAM N/A 11/02/2013   Procedure: INTRAOPERATIVE TRANSESOPHAGEAL ECHOCARDIOGRAM;  Surgeon: Gaye Pollack, MD;  Location: [facility] OR;  Service: Open Heart Surgery;  Laterality: N/A;  . LAPAROSCOPIC GASTRIC BANDING  2009  . LEFT HEART CATHETERIZATION WITH CORONARY ANGIOGRAM N/A 10/30/2013   Procedure: LEFT HEART CATHETERIZATION WITH CORONARY ANGIOGRAM;  Surgeon: Minus Breeding, MD;  Location[facility] CATH LAB;  Service: Cardiovascular;  Laterality: N/A;  . Left index finger repair    . LUMBAR FUSION    . SPINE SURGERY    . VESICOVAGINAL FISTULA CLOSURE W/ TAH      Current Outpatient Prescriptions  Medication Sig Dispense Refill  . amitriptyline (ELAVIL) 25 MG tablet TAKE 1 TABLET BY MOUTH AT BEDTIME 90 tablet 1  . Aspirin-Acetaminophen-Caffeine (GOODY HEADACHE PO) Take 1 Package by mouth 2 (two) times daily as needed (headache).    Marland Kitchen buPROPion (WELLBUTRIN XL) 300 MG 24 hr tablet TAKE 1 TABLET BY MOUTH EVERY MORNING 90 tablet 1  . clonazePAM (KLONOPIN) 1 MG tablet TAKE 1 TABLET BY MOUTH  3 TIMES A DAY 90 tablet 0  . DULoxetine (CYMBALTA) 60 MG capsule TAKE ONE CAPSULE BY MOUTH TWICE A DAY 180 capsule 1  . fenofibrate (TRICOR) 145 MG tablet TAKE 1 TABLET BY MOUTH EVERY DAY **12/23 90 tablet 1  . furosemide (LASIX) 20 MG tablet Take daily as needed for leg swelling (Patient taking differently: Take 20 mg by mouth daily as needed for fluid. Take daily as needed for leg swelling) 30 tablet 6  . HYDROcodone-acetaminophen (NORCO/VICODIN) 5-325 MG tablet Take 1 tablet by mouth every 4 (four) hours as needed. 15 tablet 0  . levothyroxine (SYNTHROID, LEVOTHROID)  112 MCG tablet TAKE 1 TABLET (112 MCG TOTAL) BY MOUTH DAILY. DOSE INCREASE EFFECTIVE 08/06/2016 30 tablet 4  . levothyroxine (SYNTHROID, LEVOTHROID) 112 MCG tablet Take 1 tablet (112 mcg total) by mouth daily. 90 tablet 1  . loratadine (CLARITIN) 10 MG tablet Take 1 tablet (10 mg total) by mouth daily as needed for allergies. 30 tablet 6  . meloxicam (MOBIC) 7.5 MG tablet Take 1 tablet (7.5 mg total) by mouth 2 (two) times daily. Take with a meal 12 tablet 0  . metoprolol succinate (TOPROL-XL) 25 MG 24 hr tablet TAKE 1 TABLET BY MOUTH DAILY 90 tablet 1  . Multiple Vitamin (MULTIVITAMIN) tablet Take 1 tablet by mouth daily.    . naproxen (NAPROSYN) 500 MG tablet Take 1 tablet (500 mg total) by mouth 2 (two) times daily with a meal. 10 tablet 0  . nitroGLYCERIN (NITROSTAT) 0.4 MG SL tablet PLACE 1 TABLET UNDER THE TONGUE EVERY 5 MINUTES AS NEEDED FOR CHEST PAIN 25 tablet 3  . Omega-3 Fatty Acids (FISH OIL PO) Take 1 capsule by mouth daily.    Marland Kitchen oxybutynin (DITROPAN-XL) 10 MG 24 hr tablet Take 1 tablet (10 mg total) by mouth at bedtime. 30 tablet 4  . ranitidine (ZANTAC) 150 MG tablet TAKE 1 TABLET BY MOUTH TWICE A DAY 180 tablet 1  . rosuvastatin (CRESTOR) 40 MG tablet TAKE 1 TABLET (40 MG TOTAL) BY MOUTH DAILY. 90 tablet 1   No current facility-administered medications for this visit.    Allergies:  Gabapentin; Meperidine hcl; Niacin; and Propoxyphene n-acetaminophen   Social History: The patient  reports that she has been smoking Cigarettes.  She has never used smokeless tobacco. She reports that she does not drink alcohol or use drugs.   ROS:  Please see the history of present illness. Otherwise, complete review of systems is positive for decreased range of motion right shoulder after fall.  All other systems are reviewed and negative.   Physical Exam: VS:  BP (!) 154/68 (BP Location: Left Arm)   Pulse 84   Ht 5\' 5"  (1.651 m)   Wt 191 lb (86.6 kg)   SpO2 98%   BMI 31.78 kg/m , BMI Body  mass index is 31.78 kg/m.  Wt Readings from Last 3 Encounters:  06/14/17 191 lb (86.6 kg)  05/05/17 191 lb (86.6 kg)  04/22/17 191 lb (86.6 kg)    Overweight woman, appears comfortable at rest.  HEENT: Conjunctiva and lids normal, oropharynx clear.  Neck: Supple, no elevated JVP or carotid bruits, no thyromegaly.  Lungs: Clear to auscultation, nonlabored breathing at rest.  Cardiac: Regular rate and rhythm, no S3 or significant systolic murmur, no pericardial rub.  Abdomen: Soft, nontender, bowel sounds present, no guarding or rebound.  Extremities: No pitting edema, distal pulses 2+. Skin: Warm and dry. Musculoskeletal: No kyphosis. Neuropsychiatric: Alert and oriented 3, affect appropriate.  ECG: I personally reviewed the tracing from 06/17/2016 which showed sinus rhythm with leftward axis and R' in lead V1 and V2.  Recent Labwork: 01/11/2017: TSH 1.72 04/22/2017: ALT 8; AST 16; BUN 24; Creat 1.55; Hemoglobin 12.6; Platelets 252; Potassium 4.7; Sodium 138     Component Value Date/Time   CHOL 107 04/22/2017 0952   TRIG 185 (H) 04/22/2017 0952   HDL 23 (L) 04/22/2017 0952   CHOLHDL 4.7 04/22/2017 0952   VLDL 37 (H) 04/22/2017 0952   LDLCALC 47 04/22/2017 0952    Other Studies Reviewed Today:  Echocardiogram 11/01/2013: Study Conclusions  Left ventricle: The cavity size was normal. Wall thickness was normal. Systolic function was normal. The estimated ejection fraction was in the range of 55% to 60%. Wall motion was normal; there were no regional wall motion abnormalities. There was an increased relative contribution of atrial contraction to ventricular filling.  Assessment and Plan:  1. Multivessel CAD status post CABG in 2014. She has stable infrequent angina symptoms and reports compliance with her medications. ECG reviewed. Continue with observation.  2. Mixed hyperlipidemia on Crestor and TriCor. Recent lab work shows triglycerides mildly elevated and well  controlled LDL.  3. Essential hypertension, no changes made present regimen. Keep follow-up with Dr. Moshe Cipro.  4. CKD stage III, creatinine 1.55.  Current medicines were reviewed with the patient today.   Orders Placed This Encounter  Procedures  . EKG 12-Lead    Disposition: Follow-up in 6 months.  Signed, Satira Sark, MD, [facility] 06/14/2017 11:49 AM    [facility]. [address] [place], [place] [address] Phone: [phone]; Fax: [phone]

## 2017-06-14 ENCOUNTER — Encounter: Payer: Self-pay | Admitting: Cardiology

## 2017-06-14 ENCOUNTER — Ambulatory Visit (INDEPENDENT_AMBULATORY_CARE_PROVIDER_SITE_OTHER): Payer: Medicare Other | Admitting: Cardiology

## 2017-06-14 VITALS — BP 154/68 | HR 84 | Ht 65.0 in | Wt 191.0 lb

## 2017-06-14 DIAGNOSIS — E782 Mixed hyperlipidemia: Secondary | ICD-10-CM | POA: Diagnosis not present

## 2017-06-14 DIAGNOSIS — N183 Chronic kidney disease, stage 3 unspecified: Secondary | ICD-10-CM

## 2017-06-14 DIAGNOSIS — I25119 Atherosclerotic heart disease of native coronary artery with unspecified angina pectoris: Secondary | ICD-10-CM | POA: Diagnosis not present

## 2017-06-14 DIAGNOSIS — I10 Essential (primary) hypertension: Secondary | ICD-10-CM

## 2017-06-14 NOTE — Patient Instructions (Signed)
Your physician wants you to follow-up in: 6 months with Dr Ferne Reus will receive a reminder letter in the mail two months in advance. If you don't receive a letter, please call our office to schedule the follow-up appointment.     Your physician recommends that you continue on your current medications as directed. Please refer to the Current Medication list given to you today.    If you need a refill on your cardiac medications before your next appointment, please call your pharmacy.      No tests or lab work ordered today.      Thank you for choosing [facility] !

## 2017-06-21 ENCOUNTER — Other Ambulatory Visit: Payer: Self-pay | Admitting: Family Medicine

## 2017-06-21 DIAGNOSIS — N39498 Other specified urinary incontinence: Secondary | ICD-10-CM

## 2017-06-30 ENCOUNTER — Ambulatory Visit: Payer: Self-pay

## 2017-07-05 ENCOUNTER — Ambulatory Visit (INDEPENDENT_AMBULATORY_CARE_PROVIDER_SITE_OTHER): Payer: Medicare Other

## 2017-07-05 ENCOUNTER — Ambulatory Visit: Payer: Self-pay

## 2017-07-05 VITALS — BP 134/72 | HR 60 | Temp 98.6°F | Ht 65.0 in | Wt 191.1 lb

## 2017-07-05 DIAGNOSIS — Z0000 Encounter for general adult medical examination without abnormal findings: Secondary | ICD-10-CM

## 2017-07-05 DIAGNOSIS — Z1211 Encounter for screening for malignant neoplasm of colon: Secondary | ICD-10-CM | POA: Diagnosis not present

## 2017-07-05 NOTE — Progress Notes (Signed)
Subjective:   Tammy Suarez is a 71 y.o. female who presents for Medicare Annual (Subsequent) preventive examination.  Review of Systems:  Cardiac Risk Factors include: advanced age (>76men, >82 women);diabetes mellitus;dyslipidemia;hypertension;obesity (BMI >30kg/m2);sedentary lifestyle;smoking/ tobacco exposure     Objective:     Vitals: BP 134/72   Pulse 60   Temp 98.6 F (37 C) (Oral)   Ht 5\' 5"  (1.651 m)   Wt 191 lb 1.9 oz (86.7 kg)   BMI 31.80 kg/m   Body mass index is 31.8 kg/m.   Tobacco History  Smoking Status  . Current Every Day Smoker  . Packs/day: 0.25  . Years: 50.00  . Types: Cigarettes  Smokeless Tobacco  . Never Used    Comment: 1 PPD smoker for 2 years      Ready to quit: Yes Counseling given: Yes   Past Medical History:  Diagnosis Date  . Arthritis   . Carpal tunnel syndrome    bilaterally  . Chronic back pain   . CKD (chronic kidney disease) stage 3, GFR 30-59 ml/min   . Coronary atherosclerosis of native coronary artery    Multivessel status post CABG - LIMA to LAD, SVG to OM and SVG to PDA  . Depression   . Essential hypertension, benign   . GERD (gastroesophageal reflux disease)   . Headache   . History of conjunctivitis   . History of hiatal hernia   . Hyperlipidemia   . Hypothyroidism   . Myocardial infarction ([place])   . Obesity   . Vitamin B 12 deficiency    Past Surgical History:  Procedure Laterality Date  . ABDOMINAL HYSTERECTOMY    . BACK SURGERY    . CARDIAC SURGERY    . CARPAL TUNNEL RELEASE Left 07/23/2016   Procedure: CARPAL TUNNEL RELEASE;  Surgeon: Carole Civil, MD;  Location: [facility];  Service: Orthopedics;  Laterality: Left;  . CERVICAL LAMINECTOMY    . CHOLECYSTECTOMY    . CORONARY ARTERY BYPASS GRAFT N/A 11/02/2013   Procedure: CORONARY ARTERY BYPASS GRAFTING (CABG);  Surgeon: Gaye Pollack, MD;  Location: [facility];  Service: Open Heart Surgery;  Laterality: N/A;  CABG x three, using left internal mammary  artery and right leg greater saphenous vein harvested endoscopically  . COSMETIC SURGERY  07/01/2012   Recurrent skin infection on lower abdomen, [facility]  . HERNIA REPAIR    . Inguinal herniorrhaphy    . INTRAOPERATIVE TRANSESOPHAGEAL ECHOCARDIOGRAM N/A 11/02/2013   Procedure: INTRAOPERATIVE TRANSESOPHAGEAL ECHOCARDIOGRAM;  Surgeon: Gaye Pollack, MD;  Location[facility];  Service: Open Heart Surgery;  Laterality: N/A;  . LAPAROSCOPIC GASTRIC BANDING  2009  . LEFT HEART CATHETERIZATION WITH CORONARY ANGIOGRAM N/A 10/30/2013   Procedure: LEFT HEART CATHETERIZATION WITH CORONARY ANGIOGRAM;  Surgeon: Minus Breeding, MD;  Location: [facility] CATH LAB;  Service: Cardiovascular;  Laterality: N/A;  . Left index finger repair    . LUMBAR FUSION    . SPINE SURGERY    . VESICOVAGINAL FISTULA CLOSURE W/ TAH     Family History  Problem Relation Age of Onset  . Heart attack Mother   . Heart attack Father   . Hypertension Sister   . Heart attack Sister   . Cancer Brother        Renal cell   . Hepatitis Daughter   . Drug abuse Daughter   . Hypertension Daughter    History  Sexual Activity  . Sexual activity: No    Outpatient Encounter Prescriptions as  of 07/05/2017  Medication Sig  . amitriptyline (ELAVIL) 25 MG tablet TAKE 1 TABLET BY MOUTH AT BEDTIME  . Aspirin-Acetaminophen-Caffeine (GOODY HEADACHE PO) Take 1 Package by mouth 2 (two) times daily as needed (headache).  Marland Kitchen buPROPion (WELLBUTRIN XL) 300 MG 24 hr tablet TAKE 1 TABLET BY MOUTH EVERY MORNING  . clonazePAM (KLONOPIN) 1 MG tablet TAKE 1 TABLET BY MOUTH 3 TIMES A DAY  . DULoxetine (CYMBALTA) 60 MG capsule TAKE ONE CAPSULE BY MOUTH TWICE A DAY  . fenofibrate (TRICOR) 145 MG tablet TAKE 1 TABLET BY MOUTH EVERY DAY **12/23  . furosemide (LASIX) 20 MG tablet Take daily as needed for leg swelling (Patient taking differently: Take 20 mg by mouth daily as needed for fluid. Take daily as needed for leg swelling)  . levothyroxine (SYNTHROID,  LEVOTHROID) 112 MCG tablet Take 1 tablet (112 mcg total) by mouth daily.  Marland Kitchen loratadine (CLARITIN) 10 MG tablet Take 1 tablet (10 mg total) by mouth daily as needed for allergies.  . meloxicam (MOBIC) 7.5 MG tablet Take 1 tablet (7.5 mg total) by mouth 2 (two) times daily. Take with a meal  . metoprolol succinate (TOPROL-XL) 25 MG 24 hr tablet TAKE 1 TABLET BY MOUTH DAILY  . Multiple Vitamin (MULTIVITAMIN) tablet Take 1 tablet by mouth daily.  . naproxen (NAPROSYN) 500 MG tablet Take 1 tablet (500 mg total) by mouth 2 (two) times daily with a meal.  . nitroGLYCERIN (NITROSTAT) 0.4 MG SL tablet PLACE 1 TABLET UNDER THE TONGUE EVERY 5 MINUTES AS NEEDED FOR CHEST PAIN  . Omega-3 Fatty Acids (FISH OIL PO) Take 1 capsule by mouth daily.  Marland Kitchen oxybutynin (DITROPAN-XL) 10 MG 24 hr tablet Take 1 tablet (10 mg total) by mouth at bedtime.  . ranitidine (ZANTAC) 150 MG tablet TAKE 1 TABLET BY MOUTH TWICE A DAY  . rosuvastatin (CRESTOR) 40 MG tablet TAKE 1 TABLET (40 MG TOTAL) BY MOUTH DAILY.  . [DISCONTINUED] HYDROcodone-acetaminophen (NORCO/VICODIN) 5-325 MG tablet Take 1 tablet by mouth every 4 (four) hours as needed.  . [DISCONTINUED] levothyroxine (SYNTHROID, LEVOTHROID) 112 MCG tablet TAKE 1 TABLET (112 MCG TOTAL) BY MOUTH DAILY. DOSE INCREASE EFFECTIVE 08/06/2016  . [DISCONTINUED] oxybutynin (DITROPAN-XL) 10 MG 24 hr tablet TAKE 1 TABLET BY MOUTH AT BEDTIME *INS WILL PAY 08/12   No facility-administered encounter medications on file as of 07/05/2017.     Activities of Daily Living In your present state of health, do you have any difficulty performing the following activities: 07/05/2017 07/17/2016  Hearing? Y -  Comment [facility] -  Vision? Y N  Comment recommend follow up with Dr. Precious Bard -  Difficulty concentrating or making decisions? N N  Walking or climbing stairs? [name]  Comment difficulty climbing stairs SOB on exertion  Dressing or bathing? N N    Doing errands, shopping? N N  Preparing Food and eating ? N -  Using the Toilet? N -  In the past six months, have you accidently leaked urine? N -  Do you have problems with loss of bowel control? N -  Managing your Medications? N -  Managing your Finances? N -  Housekeeping or managing your Housekeeping? N -  Some recent data might be hidden    Patient Care Team: Fayrene Helper, MD as PCP - General Domenic Polite Aloha Gell, MD as Consulting Physician (Cardiology) Earnie Larsson, MD as Consulting Physician (Neurosurgery) Carole Civil, MD as Consulting Physician (  Orthopedic Surgery) Phillips Odor, MD as Consulting Physician (Neurology)    Assessment:    Exercise Activities and Dietary recommendations Current Exercise Habits: The patient does not participate in regular exercise at present, Exercise limited by: None identified  Goals    . Exercise 3x per week (30 min per time)          Recommend starting a routine exercise program at least 3 days a week for 30-45 minutes at a time as tolerated.       . Quit smoking / using tobacco      Fall Risk Fall Risk  07/05/2017 04/22/2017 06/23/2016 05/26/2016 04/09/2015  Falls in the past year? Yes Yes Yes Yes Yes  Number falls in past yr: 2 or more 2 or more 2 or more 2 or more 2 or more  Injury with Fall? No Yes - - Yes  Risk Factor Category  - High Fall Risk - - High Fall Risk  Risk for fall due to : Impaired balance/gait - - - -  Follow up Falls evaluation completed;Education provided;Falls prevention discussed - - - -   Depression Screen PHQ 2/9 Scores 07/05/2017 01/11/2017 06/23/2016 06/23/2016  PHQ - 2 Score 4 3 6 6   PHQ- 9 Score 6 13 17 17      Cognitive Function: Normal   6CIT Screen 07/05/2017  What Year? 0 points  What month? 0 points  What time? 0 points  Count back from 20 0 points  Months in reverse 0 points  Repeat phrase 0 points  Total Score 0    Immunization History  Administered Date(s) Administered  .  Influenza Split 10/20/2011, 08/08/2012  . Influenza Whole 09/23/2009, 09/26/2010  . Influenza,inj,Quad PF,36+ Mos 07/28/2013, 09/03/2014, 10/02/2015, 09/09/2016  . Pneumococcal Conjugate-13 01/30/2015  . Pneumococcal Polysaccharide-23 01/30/2011  . Td 06/13/2010  . Zoster 12/09/2015   Screening Tests Health Maintenance  Topic Date Due  . COLONOSCOPY  02/19/2016  . OPHTHALMOLOGY EXAM  02/04/2017  . INFLUENZA VACCINE  06/16/2017  . FOOT EXAM  09/09/2017  . HEMOGLOBIN A1C  10/22/2017  . URINE MICROALBUMIN  01/11/2018  . MAMMOGRAM  11/26/2018  . TETANUS/TDAP  06/13/2020  . DEXA SCAN  Completed  . Hepatitis C Screening  Completed  . PNA vac Low Risk Adult  Completed      Plan:   I have personally reviewed and noted the following in the patient's chart:   . Medical and social history . Use of alcohol, tobacco or illicit drugs  . Current medications and supplements . Functional ability and status . Nutritional status . Physical activity . Advanced directives . List of other physicians . Hospitalizations, surgeries, and ER visits in previous 12 months . Vitals . Screenings to include cognitive, depression, and falls . Referrals and appointments: Community resource referral sent today to help assist patient in paying her copays for her diabetic eye exam and for her follow up visit with Dr. Aline Brochure. Referral sent in to Dr. Oneida Alar for her screening colonoscopy.   In addition, I have reviewed and discussed with patient certain preventive protocols, quality metrics, and best practice recommendations. A written personalized care plan for preventive services as well as general preventive health recommendations were provided to patient.     Stormy Fabian, LPN  [id number]

## 2017-07-05 NOTE — Patient Instructions (Addendum)
[name] , Thank you for taking time to come for your Medicare Wellness Visit. I appreciate your ongoing commitment to your health goals. Please review the following plan we discussed and let me know if I can assist you in the future.   Screening recommendations/referrals: Colonoscopy: Due, referral sent today to Tammy Suarez Mammogram: Up to date, next due 11/2017 Bone Density: Up to date Diabetic Eye Exam: Due, please schedule this as soon as you are able.  Recommended yearly dental visit for hygiene and checkup  Vaccinations: Influenza vaccine: Due Pneumococcal vaccine: Up to date Tdap vaccine: Up to date, next due 05/2020 Shingles vaccine: Complete    Advanced directives: Advance directive discussed with you today. I have provided a copy for you to complete at [facility] and have notarized. Once this is complete please bring a copy in to our office so we can scan it into your chart.  Conditions/risks identified: Obese, recommend starting a routine exercise program at least 3 days a week for 30-45 minutes at a time as tolerated.   Next appointment: Follow up with Tammy Suarez on 09/22/2017 at 9:00 am. Follow up in 1 year for your annual wellness visit.  Preventive Care 71 Years and Older, Female Preventive care refers to lifestyle choices and visits with your health care provider that can promote health and wellness. What does preventive care include?  A yearly physical exam. This is also called an annual well check.  Dental exams once or twice a year.  Routine eye exams. Ask your health care provider how often you should have your eyes checked.  Personal lifestyle choices, including:  Daily care of your teeth and gums.  Regular physical activity.  Eating a healthy diet.  Avoiding tobacco and drug use.  Limiting alcohol use.  Practicing safe sex.  Taking low-dose aspirin every day.  Taking vitamin and mineral supplements as recommended by your health care  provider. What happens during an annual well check? The services and screenings done by your health care provider during your annual well check will depend on your age, overall health, lifestyle risk factors, and family history of disease. Counseling  Your health care provider may ask you questions about your:  Alcohol use.  Tobacco use.  Drug use.  Emotional well-being.  Suarez and relationship well-being.  Sexual activity.  Eating habits.  History of falls.  Memory and ability to understand (cognition).  Work and work Statistician.  Reproductive health. Screening  You may have the following tests or measurements:  Height, weight, and BMI.  Blood pressure.  Lipid and cholesterol levels. These may be checked every 5 years, or more frequently if you are over 30 years old.  Skin check.  Lung cancer screening. You may have this screening every year starting at age 42 if you have a 30-pack-year history of smoking and currently smoke or have quit within the past 15 years.  Fecal occult blood test (FOBT) of the stool. You may have this test every year starting at age 57.  Flexible sigmoidoscopy or colonoscopy. You may have a sigmoidoscopy every 5 years or a colonoscopy every 10 years starting at age 36.  Hepatitis C blood test.  Hepatitis B blood test.  Sexually transmitted disease (STD) testing.  Diabetes screening. This is done by checking your blood sugar (glucose) after you have not eaten for a while (fasting). You may have this done every 1-3 years.  Bone density scan. This is done to screen for osteoporosis. You may have  this done starting at age 85.  Mammogram. This may be done every 1-2 years. Talk to your health care provider about how often you should have regular mammograms. Talk with your health care provider about your test results, treatment options, and if necessary, the need for more tests. Vaccines  Your health care provider may recommend certain  vaccines, such as:  Influenza vaccine. This is recommended every year.  Tetanus, diphtheria, and acellular pertussis (Tdap, Td) vaccine. You may need a Td booster every 10 years.  Zoster vaccine. You may need this after age 34.  Pneumococcal 13-valent conjugate (PCV13) vaccine. One dose is recommended after age 94.  Pneumococcal polysaccharide (PPSV23) vaccine. One dose is recommended after age 11. Talk to your health care provider about which screenings and vaccines you need and how often you need them. This information is not intended to replace advice given to you by your health care provider. Make sure you discuss any questions you have with your health care provider. Document Released: 11/29/2015 Document Revised: 07/22/2016 Document Reviewed: 09/03/2015 Tammy Suarez  2017 Tammy Suarez Falls can cause injuries. They can happen to people of all ages. There are many things you can do to make your Suarez safe and to help prevent falls. What can I do on the outside of my Suarez?  Regularly fix the edges of walkways and driveways and fix any cracks.  Remove anything that might make you trip as you walk through a door, such as a raised step or threshold.  Trim any bushes or trees on the path to your Suarez.  Use bright outdoor lighting.  Clear any walking paths of anything that might make someone trip, such as rocks or tools.  Regularly check to see if handrails are loose or broken. Make sure that both sides of any steps have handrails.  Any raised decks and porches should have guardrails on the edges.  Have any leaves, snow, or ice cleared regularly.  Use sand or salt on walking paths during winter.  Clean up any spills in your garage right away. This includes oil or grease spills. What can I do in the bathroom?  Use night lights.  Install grab bars by the toilet and in the tub and shower. Do not use towel bars as grab  bars.  Use non-skid mats or decals in the tub or shower.  If you need to sit down in the shower, use a plastic, non-slip stool.  Keep the floor dry. Clean up any water that spills on the floor as soon as it happens.  Remove soap buildup in the tub or shower regularly.  Attach bath mats securely with double-sided non-slip rug tape.  Do not have throw rugs and other things on the floor that can make you trip. What can I do in the bedroom?  Use night lights.  Make sure that you have a light by your bed that is easy to reach.  Do not use any sheets or blankets that are too big for your bed. They should not hang down onto the floor.  Have a firm chair that has side arms. You can use this for support while you get dressed.  Do not have throw rugs and other things on the floor that can make you trip. What can I do in the kitchen?  Clean up any spills right away.  Avoid walking on wet floors.  Keep items that you use a lot in easy-to-reach  places.  If you need to reach something above you, use a strong step stool that has a grab bar.  Keep electrical cords out of the way.  Do not use floor polish or wax that makes floors slippery. If you must use wax, use non-skid floor wax.  Do not have throw rugs and other things on the floor that can make you trip. What can I do with my stairs?  Do not leave any items on the stairs.  Make sure that there are handrails on both sides of the stairs and use them. Fix handrails that are broken or loose. Make sure that handrails are as long as the stairways.  Check any carpeting to make sure that it is firmly attached to the stairs. Fix any carpet that is loose or worn.  Avoid having throw rugs at the top or bottom of the stairs. If you do have throw rugs, attach them to the floor with carpet tape.  Make sure that you have a light switch at the top of the stairs and the bottom of the stairs. If you do not have them, ask someone to add them for  you. What else can I do to help prevent falls?  Wear shoes that:  Do not have high heels.  Have rubber bottoms.  Are comfortable and fit you well.  Are closed at the toe. Do not wear sandals.  If you use a stepladder:  Make sure that it is fully opened. Do not climb a closed stepladder.  Make sure that both sides of the stepladder are locked into place.  Ask someone to hold it for you, if possible.  Clearly mark and make sure that you can see:  Any grab bars or handrails.  First and last steps.  Where the edge of each step is.  Use tools that help you move around (mobility aids) if they are needed. These include:  Canes.  Walkers.  Scooters.  Crutches.  Turn on the lights when you go into a dark area. Replace any light bulbs as soon as they burn out.  Set up your furniture so you have a clear path. Avoid moving your furniture around.  If any of your floors are uneven, fix them.  If there are any pets around you, be aware of where they are.  Review your medicines with your doctor. Some medicines can make you feel dizzy. This can increase your chance of falling. Ask your doctor what other things that you can do to help prevent falls. This information is not intended to replace advice given to you by your health care provider. Make sure you discuss any questions you have with your health care provider. Document Released: 08/29/2009 Document Revised: 04/09/2016 Document Reviewed: 12/07/2014 Tammy Suarez  2017 [employer].  Steps to Quit Smoking Smoking tobacco can be bad for your health. It can also affect almost every organ in your body. Smoking puts you and people around you at risk for many serious long-lasting (chronic) diseases. Quitting smoking is hard, but it is one of the best things that you can do for your health. It is never too late to quit. What are the benefits of quitting smoking? When you quit smoking, you lower your risk  for getting serious diseases and conditions. They can include:  Lung cancer or lung disease.  Heart disease.  Stroke.  Heart attack.  Not being able to have children (infertility).  Weak bones (osteoporosis) and broken bones (fractures).  If  you have coughing, wheezing, and shortness of breath, those symptoms may get better when you quit. You may also get sick less often. If you are pregnant, quitting smoking can help to lower your chances of having a baby of low birth weight. What can I do to help me quit smoking? Talk with your doctor about what can help you quit smoking. Some things you can do (strategies) include:  Quitting smoking totally, instead of slowly cutting back how much you smoke over a period of time.  Going to in-person counseling. You are more likely to quit if you go to many counseling sessions.  Using resources and support systems, such as: ? Database administrator with a Social worker. ? Phone quitlines. ? Careers information officer. ? Support groups or group counseling. ? Text messaging programs. ? Mobile phone apps or applications.  Taking medicines. Some of these medicines may have nicotine in them. If you are pregnant or breastfeeding, do not take any medicines to quit smoking unless your doctor says it is okay. Talk with your doctor about counseling or other things that can help you.  Talk with your doctor about using more than one strategy at the same time, such as taking medicines while you are also going to in-person counseling. This can help make quitting easier. What things can I do to make it easier to quit? Quitting smoking might feel very hard at first, but there is a lot that you can do to make it easier. Take these steps:  Talk to your family and friends. Ask them to support and encourage you.  Call phone quitlines, reach out to support groups, or work with a Social worker.  Ask people who smoke to not smoke around you.  Avoid places that make you want  (trigger) to smoke, such as: ? Bars. ? Parties. ? Smoke-break areas at work.  Spend time with people who do not smoke.  Lower the stress in your life. Stress can make you want to smoke. Try these things to help your stress: ? Getting regular exercise. ? Deep-breathing exercises. ? Yoga. ? Meditating. ? Doing a body scan. To do this, close your eyes, focus on one area of your body at a time from head to toe, and notice which parts of your body are tense. Try to relax the muscles in those areas.  Download or buy apps on your mobile phone or tablet that can help you stick to your quit plan. There are many free apps, such as QuitGuide from the [employer] Office manager for Disease Control and Prevention). You can find more support from smokefree.gov and other websites.  This information is not intended to replace advice given to you by your health care provider. Make sure you discuss any questions you have with your health care provider. Document Released: 08/29/2009 Document Revised: 06/30/2016 Document Reviewed: 03/19/2015 Tammy Suarez  2018 [employer].

## 2017-07-06 ENCOUNTER — Other Ambulatory Visit: Payer: Self-pay

## 2017-07-06 MED ORDER — CLONAZEPAM 1 MG PO TABS: 1.0000 mg | ORAL_TABLET | Freq: Three times a day (TID) | ORAL | 0 refills | Status: DC

## 2017-07-16 ENCOUNTER — Other Ambulatory Visit: Payer: Self-pay | Admitting: Family Medicine

## 2017-07-16 NOTE — Telephone Encounter (Signed)
Seen [phone]

## 2017-07-21 ENCOUNTER — Ambulatory Visit: Payer: Self-pay

## 2017-08-11 ENCOUNTER — Other Ambulatory Visit: Payer: Self-pay | Admitting: Family Medicine

## 2017-08-19 ENCOUNTER — Telehealth: Payer: Self-pay

## 2017-08-19 ENCOUNTER — Telehealth: Payer: Self-pay | Admitting: Family Medicine

## 2017-08-19 MED ORDER — CLONAZEPAM 1 MG PO TABS: 1.0000 mg | ORAL_TABLET | Freq: Three times a day (TID) | ORAL | 0 refills | Status: DC

## 2017-08-19 NOTE — Telephone Encounter (Signed)
Called pt to discuss need for community resources. Will mail patient resource information for food resources, dental resources, financial resources and eyeglasses resources.    Josepha Pigg, B.A.  Care Guide [phone]

## 2017-08-19 NOTE — Telephone Encounter (Signed)
Patient is requesting refill of klonopin  [facility]  Cb#: [id number]

## 2017-08-20 ENCOUNTER — Telehealth: Payer: Self-pay | Admitting: Family Medicine

## 2017-08-20 NOTE — Telephone Encounter (Signed)
Patient calling to get medications refilled. Did not specify which medications in message.

## 2017-08-20 NOTE — Telephone Encounter (Signed)
refaxed klonipin rx

## 2017-09-20 ENCOUNTER — Other Ambulatory Visit: Payer: Self-pay | Admitting: Family Medicine

## 2017-09-20 DIAGNOSIS — N39498 Other specified urinary incontinence: Secondary | ICD-10-CM

## 2017-09-22 ENCOUNTER — Encounter: Payer: Self-pay | Admitting: Family Medicine

## 2017-09-22 ENCOUNTER — Ambulatory Visit (INDEPENDENT_AMBULATORY_CARE_PROVIDER_SITE_OTHER): Payer: Medicare Other | Admitting: Family Medicine

## 2017-09-22 VITALS — BP 132/70 | HR 88 | Resp 16 | Ht 65.0 in | Wt 188.0 lb

## 2017-09-22 DIAGNOSIS — F17218 Nicotine dependence, cigarettes, with other nicotine-induced disorders: Secondary | ICD-10-CM | POA: Diagnosis not present

## 2017-09-22 DIAGNOSIS — E785 Hyperlipidemia, unspecified: Secondary | ICD-10-CM

## 2017-09-22 DIAGNOSIS — I25119 Atherosclerotic heart disease of native coronary artery with unspecified angina pectoris: Secondary | ICD-10-CM

## 2017-09-22 DIAGNOSIS — N39498 Other specified urinary incontinence: Secondary | ICD-10-CM | POA: Diagnosis not present

## 2017-09-22 DIAGNOSIS — E038 Other specified hypothyroidism: Secondary | ICD-10-CM

## 2017-09-22 DIAGNOSIS — F418 Other specified anxiety disorders: Secondary | ICD-10-CM

## 2017-09-22 DIAGNOSIS — I10 Essential (primary) hypertension: Secondary | ICD-10-CM | POA: Diagnosis not present

## 2017-09-22 DIAGNOSIS — Z23 Encounter for immunization: Secondary | ICD-10-CM

## 2017-09-22 MED ORDER — BUPROPION HCL ER (XL) 300 MG PO TB24: 300.0000 mg | ORAL_TABLET | Freq: Every day | ORAL | 5 refills | Status: DC

## 2017-09-22 MED ORDER — METOPROLOL SUCCINATE ER 25 MG PO TB24: 25.0000 mg | ORAL_TABLET | Freq: Every day | ORAL | 3 refills | Status: DC

## 2017-09-22 MED ORDER — LEVOTHYROXINE SODIUM 112 MCG PO TABS: 112.0000 ug | ORAL_TABLET | Freq: Every day | ORAL | 1 refills | Status: DC

## 2017-09-22 MED ORDER — FENOFIBRATE 145 MG PO TABS: 145.0000 mg | ORAL_TABLET | Freq: Every day | ORAL | 1 refills | Status: DC

## 2017-09-22 MED ORDER — ROSUVASTATIN CALCIUM 40 MG PO TABS: 40.0000 mg | ORAL_TABLET | Freq: Every day | ORAL | 1 refills | Status: DC

## 2017-09-22 MED ORDER — FUROSEMIDE 20 MG PO TABS: ORAL_TABLET | ORAL | 0 refills | Status: DC

## 2017-09-22 MED ORDER — CLONAZEPAM 1 MG PO TABS: 1.0000 mg | ORAL_TABLET | Freq: Three times a day (TID) | ORAL | 5 refills | Status: DC

## 2017-09-22 MED ORDER — CLONAZEPAM 1 MG PO TABS: 1.0000 mg | ORAL_TABLET | Freq: Three times a day (TID) | ORAL | 0 refills | Status: DC

## 2017-09-22 NOTE — Patient Instructions (Addendum)
F/uu with rectal in 3 months, call if you need me before  Flu vaccine today  Please work on QUITTING smoking, start with 5 cigarettes daily  Effective today and I am sending in Wellbutrin for this and for your depression  Furosemide in limited quantity is sent in for as needed use only      Stop ditropan   You need to get back on propranolol for your heart this is sent in  Fasting lipid, cmp and EGFR, TH, HBA1C and vit D first week in December      Steps to Quit Smoking Smoking tobacco can be bad for your health. It can also affect almost every organ in your body. Smoking puts you and people around you at risk for many serious long-lasting (chronic) diseases. Quitting smoking is hard, but it is one of the best things that you can do for your health. It is never too late to quit. What are the benefits of quitting smoking? When you quit smoking, you lower your risk for getting serious diseases and conditions. They can include:  Lung cancer or lung disease.  Heart disease.  Stroke.  Heart attack.  Not being able to have children (infertility).  Weak bones (osteoporosis) and broken bones (fractures).  If you have coughing, wheezing, and shortness of breath, those symptoms may get better when you quit. You may also get sick less often. If you are pregnant, quitting smoking can help to lower your chances of having a baby of low birth weight. What can I do to help me quit smoking? Talk with your doctor about what can help you quit smoking. Some things you can do (strategies) include:  Quitting smoking totally, instead of slowly cutting back how much you smoke over a period of time.  Going to in-person counseling. You are more likely to quit if you go to many counseling sessions.  Using resources and support systems, such as: ? Database administrator with a Social worker. ? Phone quitlines. ? Careers information officer. ? Support groups or group counseling. ? Text messaging  programs. ? Mobile phone apps or applications.  Taking medicines. Some of these medicines may have nicotine in them. If you are pregnant or breastfeeding, do not take any medicines to quit smoking unless your doctor says it is okay. Talk with your doctor about counseling or other things that can help you.  Talk with your doctor about using more than one strategy at the same time, such as taking medicines while you are also going to in-person counseling. This can help make quitting easier. What things can I do to make it easier to quit? Quitting smoking might feel very hard at first, but there is a lot that you can do to make it easier. Take these steps:  Talk to your family and friends. Ask them to support and encourage you.  Call phone quitlines, reach out to support groups, or work with a Social worker.  Ask people who smoke to not smoke around you.  Avoid places that make you want (trigger) to smoke, such as: ? Bars. ? Parties. ? Smoke-break areas at work.  Spend time with people who do not smoke.  Lower the stress in your life. Stress can make you want to smoke. Try these things to help your stress: ? Getting regular exercise. ? Deep-breathing exercises. ? Yoga. ? Meditating. ? Doing a body scan. To do this, close your eyes, focus on one area of your body at a time from head to toe,  and notice which parts of your body are tense. Try to relax the muscles in those areas.  Download or buy apps on your mobile phone or tablet that can help you stick to your quit plan. There are many free apps, such as QuitGuide from the [employer] Office manager for Disease Control and Prevention). You can find more support from smokefree.gov and other websites.  This information is not intended to replace advice given to you by your health care provider. Make sure you discuss any questions you have with your health care provider. Document Released: 08/29/2009 Document Revised: 06/30/2016 Document Reviewed:  03/19/2015 Elsevier Interactive Patient Education  2018 [employer].

## 2017-09-24 ENCOUNTER — Other Ambulatory Visit: Payer: Self-pay | Admitting: Family Medicine

## 2017-09-25 ENCOUNTER — Encounter: Payer: Self-pay | Admitting: Family Medicine

## 2017-09-25 NOTE — Assessment & Plan Note (Signed)

## 2017-09-25 NOTE — Assessment & Plan Note (Signed)
Tammy Suarez is reminded of the importance of commitment to daily physical activity for 30 minutes or more, as able and the need to limit carbohydrate intake to 30 to 60 grams per meal to help with blood sugar control.   Tammy Suarez is reminded of the importance of daily foot exam, annual eye examination, and good blood sugar, blood pressure and cholesterol control. Updated lab needed at/ before next visit.   Diabetic Labs Latest Ref Rng & Units 04/22/2017 01/11/2017 11/26/2016 08/05/2016 07/17/2016  HbA1c <5.7 % 6.2(H) - 6.3(H) 6.5(H) -  Microalbumin Not estab mg/dL - 4.5 - - -  Micro/Creat Ratio <30 mcg/mg creat - 49(H) - - -  Chol <200 mg/dL 107 - 161 144 -  HDL >50 mg/dL 23(L) - 30(L) 36(L) -  Calc LDL <100 mg/dL 47 - 88 78 -  Triglycerides <150 mg/dL 185(H) - 216(H) 152(H) -  Creatinine 0.60 - 0.93 mg/dL 1.55(H) 1.43(H) 1.84(H) 1.38(H) 1.47(H)   BP/Weight 09/22/2017 07/05/2017 06/14/2017 05/05/2017 04/22/2017 01/11/2017 8/38/1840  Systolic BP 375 436 067 703 403 524 818  Diastolic BP 70 72 68 83 80 80 64  Wt. (Lbs) 188 191.12 191 191 191 199.4 199  BMI 31.28 31.8 31.78 31.78 31.78 33.18 33.12   Foot/eye exam completion dates Latest Ref Rng & Units 09/09/2016 02/05/2016  Eye Exam No Retinopathy - No Retinopathy  Foot exam Order - - -  Foot Form Completion - Done -

## 2017-09-25 NOTE — Assessment & Plan Note (Signed)
Improvedtient re-educated about  the importance of commitment to a  minimum of 150 minutes of exercise per week.  The importance of healthy food choices with portion control discussed. Encouraged to start a food diary, count calories and to consider  joining a support group. Sample diet sheets offered. Goals set by the patient for the next several months.   Weight /BMI 09/22/2017 07/05/2017 06/14/2017  WEIGHT 188 lb 191 lb 1.9 oz 191 lb  HEIGHT 5\' 5"  5\' 5"  5\' 5"   BMI 31.28 kg/m2 31.8 kg/m2 31.78 kg/m2

## 2017-09-25 NOTE — Assessment & Plan Note (Signed)
Controlled, no change in medication  

## 2017-09-25 NOTE — Progress Notes (Signed)
KOLBEE STALLMAN     MRN: [id number]      DOB: May 09, 1946   HPI Ms. Homen is here for follow up and re-evaluation of chronic medical conditions, medication management and review of any available recent lab and radiology data.  Preventive health is updated, specifically  Cancer screening and Immunization.   Questions or concerns regarding consultations or procedures which the PT has had in the interim are  addressed. The PT denies any adverse reactions to current medications since the last visit.  C/o pain in shoulder and hand C/o depression since unable to see the child she was fostering for 1 year  ROS Denies recent fever or chills. Denies sinus pressure, nasal congestion, ear pain or sore throat. Denies chest congestion, productive cough or wheezing. Denies chest pains, palpitations and leg swelling Denies abdominal pain, nausea, vomiting,diarrhea or constipation.   Denies dysuria, frequency, hesitancy or incontinence. Denies headaches, seizures, Denies skin break down or rash.   PE  BP 132/70   Pulse 88   Resp 16   Ht 5\' 5"  (1.651 m)   Wt 188 lb (85.3 kg)   SpO2 96%   BMI 31.28 kg/m   Patient alert and oriented and in no cardiopulmonary distress.  HEENT: No facial asymmetry, EOMI,   oropharynx pink and moist.  Neck supple no JVD, no mass.  Chest: Clear to auscultation bilaterally.Decreased air entry  CVS: S1, S2 no murmurs, no S3.Regular rate.  ABD: Soft non tender.   Ext: No edema  MS: Adequate ROM spine, shoulders, hips and knees.  Skin: Intact, no ulcerations or rash noted.  Psych: Good eye contact, normal affect. Memory intact not anxious or depressed appearing.  CNS: CN 2-12 intact, power,  normal throughout.no focal deficits noted.   Assessment & Plan  Depression with anxiety Resume wellbutrin, no interest in counseling, states unable to afford, not currently suicidal, but states she has thought about it in the past  Essential hypertension,  benign At goal Resume metoprolol , also for protection of her heart. DASH diet and commitment to daily physical activity for a minimum of 30 minutes discussed and encouraged, as a part of hypertension management. The importance of attaining a healthy weight is also discussed.  BP/Weight 09/22/2017 07/05/2017 06/14/2017 05/05/2017 04/22/2017 01/11/2017 2/97/9892  Systolic BP 119 417 408 144 818 563 149  Diastolic BP 70 72 68 83 80 80 64  Wt. (Lbs) 188 191.12 191 191 191 199.4 199  BMI 31.28 31.8 31.78 31.78 31.78 33.18 33.12    '  Hyperlipidemia with target LDL less than 70 Hyperlipidemia:Low fat diet discussed and encouraged.   Lipid Panel  Lab Results  Component Value Date   CHOL 107 04/22/2017   HDL 23 (L) 04/22/2017   LDLCALC 47 04/22/2017   TRIG 185 (H) 04/22/2017   CHOLHDL 4.7 04/22/2017   Needs to reduce fat in diet Updated lab needed at/ before next visit.   '  Hypothyroidism Controlled, no change in medication   Nicotine dependence Patient is asked and  confirms current  Nicotine use.  Five to seven minutes of time is spent in counseling the patient of the need to quit smoking  Advice to quit is delivered clearly specifically in reducing the risk of developing heart disease, having a stroke, or of developing all types of cancer, especially lung and oral cancer. Improvement in breathing and exercise tolerance and quality of life is also discussed, as is the economic benefit.  Assessment of willingness to quit  or to make an attempt to quit is made and documented  Assistance in quit attempt is made with several and varied options presented, based on patient's desire and need. These include  literature, local classes available, 1800 QUIT NOW number, OTC and prescription medication.  The GOAL to be NICOTINE FREE is re emphasized.  The patient has set a personal goal of either reduction or discontinuation and follow up is arranged between 6 an 16 weeks.    Urinary  incontinence Discontinue ditropan , s/e outweighs benefit currently  DM (diabetes mellitus), type 2 with renal complications Ms. Berkowitz is reminded of the importance of commitment to daily physical activity for 30 minutes or more, as able and the need to limit carbohydrate intake to 30 to 60 grams per meal to help with blood sugar control.   Ms. Kise is reminded of the importance of daily foot exam, annual eye examination, and good blood sugar, blood pressure and cholesterol control. Updated lab needed at/ before next visit.   Diabetic Labs Latest Ref Rng & Units 04/22/2017 01/11/2017 11/26/2016 08/05/2016 07/17/2016  HbA1c <5.7 % 6.2(H) - 6.3(H) 6.5(H) -  Microalbumin Not estab mg/dL - 4.5 - - -  Micro/Creat Ratio <30 mcg/mg creat - 49(H) - - -  Chol <200 mg/dL 107 - 161 144 -  HDL >50 mg/dL 23(L) - 30(L) 36(L) -  Calc LDL <100 mg/dL 47 - 88 78 -  Triglycerides <150 mg/dL 185(H) - 216(H) 152(H) -  Creatinine 0.60 - 0.93 mg/dL 1.55(H) 1.43(H) 1.84(H) 1.38(H) 1.47(H)   BP/Weight 09/22/2017 07/05/2017 06/14/2017 05/05/2017 04/22/2017 01/11/2017 1/85/6314  Systolic BP 970 263 785 885 027 741 287  Diastolic BP 70 72 68 83 80 80 64  Wt. (Lbs) 188 191.12 191 191 191 199.4 199  BMI 31.28 31.8 31.78 31.78 31.78 33.18 33.12   Foot/eye exam completion dates Latest Ref Rng & Units 09/09/2016 02/05/2016  Eye Exam No Retinopathy - No Retinopathy  Foot exam Order - - -  Foot Form Completion - Done -        Morbid obesity Improvedtient re-educated about  the importance of commitment to a  minimum of 150 minutes of exercise per week.  The importance of healthy food choices with portion control discussed. Encouraged to start a food diary, count calories and to consider  joining a support group. Sample diet sheets offered. Goals set by the patient for the next several months.   Weight /BMI 09/22/2017 07/05/2017 06/14/2017  WEIGHT 188 lb 191 lb 1.9 oz 191 lb  HEIGHT 5\' 5"  5\' 5"  5\' 5"   BMI 31.28 kg/m2  31.8 kg/m2 31.78 kg/m2

## 2017-09-25 NOTE — Assessment & Plan Note (Signed)
Hyperlipidemia:Low fat diet discussed and encouraged.   Lipid Panel  Lab Results  Component Value Date   CHOL 107 04/22/2017   HDL 23 (L) 04/22/2017   LDLCALC 47 04/22/2017   TRIG 185 (H) 04/22/2017   CHOLHDL 4.7 04/22/2017   Needs to reduce fat in diet Updated lab needed at/ before next visit.   '

## 2017-09-25 NOTE — Assessment & Plan Note (Signed)
At goal Resume metoprolol , also for protection of her heart. DASH diet and commitment to daily physical activity for a minimum of 30 minutes discussed and encouraged, as a part of hypertension management. The importance of attaining a healthy weight is also discussed.  BP/Weight 09/22/2017 07/05/2017 06/14/2017 05/05/2017 04/22/2017 01/11/2017 04/02/9841  Systolic BP 103 128 118 867 737 366 815  Diastolic BP 70 72 68 83 80 80 64  Wt. (Lbs) 188 191.12 191 191 191 199.4 199  BMI 31.28 31.8 31.78 31.78 31.78 33.18 33.12    '

## 2017-09-25 NOTE — Assessment & Plan Note (Signed)
Resume wellbutrin, no interest in counseling, states unable to afford, not currently suicidal, but states she has thought about it in the past

## 2017-09-25 NOTE — Assessment & Plan Note (Signed)
Discontinue ditropan , s/e outweighs benefit currently

## 2017-09-27 ENCOUNTER — Other Ambulatory Visit: Payer: Self-pay

## 2017-09-27 DIAGNOSIS — Z23 Encounter for immunization: Secondary | ICD-10-CM | POA: Diagnosis not present

## 2017-09-27 MED ORDER — FUROSEMIDE 20 MG PO TABS: ORAL_TABLET | ORAL | 0 refills | Status: DC

## 2017-09-27 MED ORDER — METOPROLOL SUCCINATE ER 25 MG PO TB24: 25.0000 mg | ORAL_TABLET | Freq: Every day | ORAL | 3 refills | Status: DC

## 2017-09-27 MED ORDER — ROSUVASTATIN CALCIUM 40 MG PO TABS: 40.0000 mg | ORAL_TABLET | Freq: Every day | ORAL | 1 refills | Status: DC

## 2017-09-27 MED ORDER — LEVOTHYROXINE SODIUM 112 MCG PO TABS: 112.0000 ug | ORAL_TABLET | Freq: Every day | ORAL | 1 refills | Status: DC

## 2017-09-27 MED ORDER — CLONAZEPAM 1 MG PO TABS: 1.0000 mg | ORAL_TABLET | Freq: Three times a day (TID) | ORAL | 5 refills | Status: DC

## 2017-09-27 MED ORDER — BUPROPION HCL ER (XL) 300 MG PO TB24
300.0000 mg | ORAL_TABLET | Freq: Every day | ORAL | 5 refills | Status: DC
Start: 2017-09-27 — End: 2018-05-05

## 2017-09-27 MED ORDER — FENOFIBRATE 145 MG PO TABS: 145.0000 mg | ORAL_TABLET | Freq: Every day | ORAL | 1 refills | Status: DC

## 2017-10-26 ENCOUNTER — Telehealth: Payer: Self-pay | Admitting: Family Medicine

## 2017-10-26 NOTE — Telephone Encounter (Signed)
Called and left message that she would have to come into the office for a nurse visit- blood pressure check before anything could be prescribed. Please schedule if she calls back

## 2017-10-26 NOTE — Telephone Encounter (Signed)
Marisha called in left a message, stating that her blood pressure is high, and needs Dr Moshe Cipro to call her something else in at the [facility] on [address].

## 2017-10-29 ENCOUNTER — Ambulatory Visit (INDEPENDENT_AMBULATORY_CARE_PROVIDER_SITE_OTHER): Payer: Medicare Other | Admitting: Family Medicine

## 2017-10-29 VITALS — BP 136/78

## 2017-10-29 DIAGNOSIS — E038 Other specified hypothyroidism: Secondary | ICD-10-CM

## 2017-10-29 DIAGNOSIS — E1121 Type 2 diabetes mellitus with diabetic nephropathy: Secondary | ICD-10-CM

## 2017-10-29 DIAGNOSIS — I10 Essential (primary) hypertension: Secondary | ICD-10-CM | POA: Diagnosis not present

## 2017-10-30 LAB — CBC
HEMATOCRIT: 35.9 % (ref 35.0–45.0)
Hemoglobin: 12.2 g/dL (ref 11.7–15.5)
MCH: 31.2 pg (ref 27.0–33.0)
MCHC: 34 g/dL (ref 32.0–36.0)
MCV: 91.8 fL (ref 80.0–100.0)
MPV: 11.2 fL (ref 7.5–12.5)
PLATELETS: 221 10*3/uL (ref 140–400)
RBC: 3.91 10*6/uL (ref 3.80–5.10)
RDW: 11.7 % (ref 11.0–15.0)
WBC: 5 10*3/uL (ref 3.8–10.8)

## 2017-10-30 LAB — COMPLETE METABOLIC PANEL WITH GFR
AG Ratio: 1.5 (calc) (ref 1.0–2.5)
ALT: 8 U/L (ref 6–29)
AST: 15 U/L (ref 10–35)
Albumin: 3.9 g/dL (ref 3.6–5.1)
Alkaline phosphatase (APISO): 44 U/L (ref 33–130)
BUN/Creatinine Ratio: 24 (calc) — ABNORMAL HIGH (ref 6–22)
BUN: 31 mg/dL — AB (ref 7–25)
CALCIUM: 9.9 mg/dL (ref 8.6–10.4)
CO2: 31 mmol/L (ref 20–32)
CREATININE: 1.27 mg/dL — AB (ref 0.60–0.93)
Chloride: 102 mmol/L (ref 98–110)
GFR, EST NON AFRICAN AMERICAN: 42 mL/min/{1.73_m2} — AB (ref >=60)
GFR, Est African American: 49 mL/min/{1.73_m2} — ABNORMAL LOW (ref >=60)
Globulin: 2.6 g/dL (calc) (ref 1.9–3.7)
Glucose, Bld: 157 mg/dL — ABNORMAL HIGH (ref 65–99)
Potassium: 4.2 mmol/L (ref 3.5–5.3)
Sodium: 138 mmol/L (ref 135–146)
Total Bilirubin: 0.5 mg/dL (ref 0.2–1.2)
Total Protein: 6.5 g/dL (ref 6.1–8.1)

## 2017-10-30 LAB — URINALYSIS, ROUTINE W REFLEX MICROSCOPIC
BACTERIA UA: NONE SEEN /HPF
Bilirubin Urine: NEGATIVE
Glucose, UA: NEGATIVE
HGB URINE DIPSTICK: NEGATIVE
Ketones, ur: NEGATIVE
Nitrite: NEGATIVE
RBC / HPF: NONE SEEN /HPF (ref 0–2)
SPECIFIC GRAVITY, URINE: 1.019 (ref 1.001–1.03)
pH: 5.5 (ref 5.0–8.0)

## 2017-10-30 LAB — MICROALBUMIN / CREATININE URINE RATIO
Creatinine, Urine: 101 mg/dL (ref 20–275)
Microalb Creat Ratio: 132 mcg/mg creat — ABNORMAL HIGH (ref <30)
Microalb, Ur: 13.3 mg/dL

## 2017-10-30 LAB — HEMOGLOBIN A1C
EAG (MMOL/L): 8.1 (calc)
Hgb A1c MFr Bld: 6.7 % of total Hgb — ABNORMAL HIGH (ref <5.7)
Mean Plasma Glucose: 146 (calc)

## 2017-10-30 LAB — TSH: TSH: 0.03 mIU/L — ABNORMAL LOW (ref 0.40–4.50)

## 2017-10-30 LAB — MICROSCOPIC MESSAGE

## 2017-10-30 LAB — LIPID PANEL
CHOL/HDL RATIO: 5.6 (calc) — AB (ref <5.0)
Cholesterol: 129 mg/dL (ref <200)
HDL: 23 mg/dL — AB (ref >50)
LDL CHOLESTEROL (CALC): 75 mg/dL
NON-HDL CHOLESTEROL (CALC): 106 mg/dL (ref <130)
TRIGLYCERIDES: 216 mg/dL — AB (ref <150)

## 2017-11-04 ENCOUNTER — Other Ambulatory Visit: Payer: Self-pay | Admitting: Family Medicine

## 2017-11-04 MED ORDER — LEVOTHYROXINE SODIUM 100 MCG PO TABS: 100.0000 ug | ORAL_TABLET | Freq: Every day | ORAL | 1 refills | Status: DC

## 2017-11-11 ENCOUNTER — Telehealth: Payer: Self-pay

## 2017-11-11 DIAGNOSIS — E038 Other specified hypothyroidism: Secondary | ICD-10-CM

## 2017-11-11 NOTE — Telephone Encounter (Signed)
-----   Message from Fayrene Helper, MD sent at 11/04/2017 12:37 PM EST ----- I spoke directly with the pt and she understands dose of thyroid med is reduced to 100 mcg daily, she is over corrected, this is sent in today, needs non fast TSH, 3 to 5 days before Feb f?u please mail order to her, I am sending in the med Also knows to reduce fried and fatty foods, butter and cheese

## 2017-12-07 DIAGNOSIS — H524 Presbyopia: Secondary | ICD-10-CM | POA: Diagnosis not present

## 2017-12-13 DIAGNOSIS — E038 Other specified hypothyroidism: Secondary | ICD-10-CM | POA: Diagnosis not present

## 2017-12-13 LAB — TSH: TSH: 0.02 m[IU]/L — AB (ref 0.40–4.50)

## 2017-12-16 ENCOUNTER — Other Ambulatory Visit: Payer: Self-pay | Admitting: Family Medicine

## 2017-12-23 ENCOUNTER — Ambulatory Visit: Payer: Self-pay | Admitting: Family Medicine

## 2017-12-27 ENCOUNTER — Telehealth: Payer: Self-pay | Admitting: Family Medicine

## 2017-12-27 NOTE — Telephone Encounter (Signed)
Patient left a vm requesting a call back, I returned her call. No answer, no voicemail.

## 2017-12-30 ENCOUNTER — Other Ambulatory Visit: Payer: Self-pay | Admitting: Family Medicine

## 2018-01-04 DIAGNOSIS — M25512 Pain in left shoulder: Secondary | ICD-10-CM | POA: Diagnosis not present

## 2018-01-04 DIAGNOSIS — Z79899 Other long term (current) drug therapy: Secondary | ICD-10-CM | POA: Diagnosis not present

## 2018-01-04 DIAGNOSIS — M25511 Pain in right shoulder: Secondary | ICD-10-CM | POA: Diagnosis not present

## 2018-01-04 DIAGNOSIS — M542 Cervicalgia: Secondary | ICD-10-CM | POA: Diagnosis not present

## 2018-01-04 DIAGNOSIS — M5440 Lumbago with sciatica, unspecified side: Secondary | ICD-10-CM | POA: Diagnosis not present

## 2018-01-11 ENCOUNTER — Telehealth: Payer: Self-pay | Admitting: Family Medicine

## 2018-01-11 NOTE — Telephone Encounter (Signed)
Needs clinical evaluation

## 2018-01-11 NOTE — Telephone Encounter (Signed)
Pls cal re symptoms I will sned in tamiflu if positive

## 2018-01-11 NOTE — Telephone Encounter (Signed)
Started feb 24 dry cough, no temp, some rattling in chest and achy with brownish mucus.  Also hasn't had BM since Feb 18. Used laxative and enema and suppositories but no relief. Gwinda Passe had offered her an appt but per previous message she declined

## 2018-01-11 NOTE — Telephone Encounter (Signed)
Pts daughter called, with PT in the back ground, wanted for Dr Moshe Cipro to call her something in, she thinks she has the FLU, I advised that she needed to be seen today as Dr Moshe Cipro was full, but Dr Meda Coffee had 1 opening. Pt stated she was to sick to come in the office. Pts daughter said that her mom was refusing to get in the car. Pt daughter advised if she got worse she would call 911

## 2018-01-12 NOTE — Telephone Encounter (Signed)
I called to offer an appt, she was going to [facility] in the AM for a Dr appt, MRI--I advised her to call on Friday morning for an same day appt

## 2018-01-13 DIAGNOSIS — M25512 Pain in left shoulder: Secondary | ICD-10-CM | POA: Diagnosis not present

## 2018-01-13 DIAGNOSIS — M25511 Pain in right shoulder: Secondary | ICD-10-CM | POA: Diagnosis not present

## 2018-01-13 DIAGNOSIS — M542 Cervicalgia: Secondary | ICD-10-CM | POA: Diagnosis not present

## 2018-01-13 DIAGNOSIS — M5440 Lumbago with sciatica, unspecified side: Secondary | ICD-10-CM | POA: Diagnosis not present

## 2018-01-14 DIAGNOSIS — B9689 Other specified bacterial agents as the cause of diseases classified elsewhere: Secondary | ICD-10-CM | POA: Diagnosis not present

## 2018-01-14 DIAGNOSIS — M25512 Pain in left shoulder: Secondary | ICD-10-CM | POA: Diagnosis not present

## 2018-01-14 DIAGNOSIS — Z79899 Other long term (current) drug therapy: Secondary | ICD-10-CM | POA: Diagnosis not present

## 2018-01-14 DIAGNOSIS — M25511 Pain in right shoulder: Secondary | ICD-10-CM | POA: Diagnosis not present

## 2018-01-14 DIAGNOSIS — M5137 Other intervertebral disc degeneration, lumbosacral region: Secondary | ICD-10-CM | POA: Diagnosis not present

## 2018-01-14 DIAGNOSIS — J208 Acute bronchitis due to other specified organisms: Secondary | ICD-10-CM | POA: Diagnosis not present

## 2018-01-14 DIAGNOSIS — M5030 Other cervical disc degeneration, unspecified cervical region: Secondary | ICD-10-CM | POA: Diagnosis not present

## 2018-01-25 ENCOUNTER — Ambulatory Visit (INDEPENDENT_AMBULATORY_CARE_PROVIDER_SITE_OTHER): Payer: Medicare HMO | Admitting: Family Medicine

## 2018-01-25 ENCOUNTER — Encounter: Payer: Self-pay | Admitting: Family Medicine

## 2018-01-25 VITALS — BP 140/82 | HR 67 | Resp 16 | Ht 65.0 in | Wt 189.0 lb

## 2018-01-25 DIAGNOSIS — E038 Other specified hypothyroidism: Secondary | ICD-10-CM

## 2018-01-25 DIAGNOSIS — R079 Chest pain, unspecified: Secondary | ICD-10-CM | POA: Diagnosis not present

## 2018-01-25 DIAGNOSIS — Z1231 Encounter for screening mammogram for malignant neoplasm of breast: Secondary | ICD-10-CM | POA: Diagnosis not present

## 2018-01-25 DIAGNOSIS — F418 Other specified anxiety disorders: Secondary | ICD-10-CM

## 2018-01-25 DIAGNOSIS — M5416 Radiculopathy, lumbar region: Secondary | ICD-10-CM

## 2018-01-25 DIAGNOSIS — I25119 Atherosclerotic heart disease of native coronary artery with unspecified angina pectoris: Secondary | ICD-10-CM

## 2018-01-25 DIAGNOSIS — E785 Hyperlipidemia, unspecified: Secondary | ICD-10-CM

## 2018-01-25 DIAGNOSIS — F17218 Nicotine dependence, cigarettes, with other nicotine-induced disorders: Secondary | ICD-10-CM

## 2018-01-25 DIAGNOSIS — M48061 Spinal stenosis, lumbar region without neurogenic claudication: Secondary | ICD-10-CM

## 2018-01-25 DIAGNOSIS — I10 Essential (primary) hypertension: Secondary | ICD-10-CM

## 2018-01-25 DIAGNOSIS — E1121 Type 2 diabetes mellitus with diabetic nephropathy: Secondary | ICD-10-CM | POA: Diagnosis not present

## 2018-01-25 NOTE — Patient Instructions (Addendum)
Wellness with nurse september same day as MD visit  F/U with MD in September   We need to schedule mammogram at checkout, and lung cancer screening test will be scheduled and we will call with that appt and call you   You are referred to cardiology    TSH today  Please stop cooking with oil and cut back on fried foods  HBA1C, fasting lipids, cmp and EGFr  In mid May  No more smoking!!!Please, heart disease  And cancer risk will decrease, you already have heart dioisease  1800 QUIT NOW call 24/7 free help with stopping smoking

## 2018-01-26 LAB — TSH: TSH: 0.92 mIU/L (ref 0.40–4.50)

## 2018-01-26 LAB — HM DIABETES EYE EXAM

## 2018-02-04 ENCOUNTER — Emergency Department (HOSPITAL_COMMUNITY)
Admission: EM | Admit: 2018-02-04 | Discharge: 2018-02-04 | Disposition: A | Discharge status: Home / Self Care | Payer: Medicare HMO | Attending: Emergency Medicine | Admitting: Emergency Medicine

## 2018-02-04 ENCOUNTER — Emergency Department (HOSPITAL_COMMUNITY): Payer: Medicare HMO

## 2018-02-04 ENCOUNTER — Encounter (HOSPITAL_COMMUNITY): Payer: Self-pay | Admitting: Emergency Medicine

## 2018-02-04 DIAGNOSIS — Z5321 Procedure and treatment not carried out due to patient leaving prior to being seen by health care provider: Secondary | ICD-10-CM | POA: Diagnosis not present

## 2018-02-04 DIAGNOSIS — R079 Chest pain, unspecified: Secondary | ICD-10-CM | POA: Diagnosis not present

## 2018-02-04 DIAGNOSIS — R0789 Other chest pain: Secondary | ICD-10-CM | POA: Diagnosis not present

## 2018-02-04 LAB — CBC
HCT: 35.8 % — ABNORMAL LOW (ref 36.0–46.0)
Hemoglobin: 11.6 g/dL — ABNORMAL LOW (ref 12.0–15.0)
MCH: 31 pg (ref 26.0–34.0)
MCHC: 32.4 g/dL (ref 30.0–36.0)
MCV: 95.7 fL (ref 78.0–100.0)
PLATELETS: 195 10*3/uL (ref 150–400)
RBC: 3.74 MIL/uL — AB (ref 3.87–5.11)
RDW: 13.3 % (ref 11.5–15.5)
WBC: 4.5 10*3/uL (ref 4.0–10.5)

## 2018-02-04 LAB — I-STAT TROPONIN, ED: Troponin i, poc: 0.01 ng/mL (ref 0.00–0.08)

## 2018-02-04 LAB — BASIC METABOLIC PANEL
Anion gap: 10 (ref 5–15)
BUN: 27 mg/dL — AB (ref 6–20)
CO2: 25 mmol/L (ref 22–32)
CREATININE: 1.44 mg/dL — AB (ref 0.44–1.00)
Calcium: 9.6 mg/dL (ref 8.9–10.3)
Chloride: 103 mmol/L (ref 101–111)
GFR calc Af Amer: 41 mL/min — ABNORMAL LOW (ref >60)
GFR calc non Af Amer: 35 mL/min — ABNORMAL LOW (ref >60)
Glucose, Bld: 184 mg/dL — ABNORMAL HIGH (ref 65–99)
Potassium: 3.9 mmol/L (ref 3.5–5.1)
Sodium: 138 mmol/L (ref 135–145)

## 2018-02-04 NOTE — ED Triage Notes (Signed)
Pt to ER for central chest tightness while driving down the road. States power steering in vehicle is out and the steering wheel has become difficult to turn. Pt has significant cardiac hx for CABG. Reports pain in right shoulder, worse with movement. Also reports minimal short of breath, dizziness, and weakness. Pt is a/o x4 NAD.

## 2018-02-04 NOTE — ED Triage Notes (Signed)
Patient called for room x 3, called over intercom, not seen in lobby.

## 2018-02-10 DIAGNOSIS — M9981 Other biomechanical lesions of cervical region: Secondary | ICD-10-CM | POA: Diagnosis not present

## 2018-02-10 DIAGNOSIS — M5030 Other cervical disc degeneration, unspecified cervical region: Secondary | ICD-10-CM | POA: Diagnosis not present

## 2018-02-10 DIAGNOSIS — M47816 Spondylosis without myelopathy or radiculopathy, lumbar region: Secondary | ICD-10-CM | POA: Diagnosis not present

## 2018-02-10 DIAGNOSIS — M5137 Other intervertebral disc degeneration, lumbosacral region: Secondary | ICD-10-CM | POA: Diagnosis not present

## 2018-02-16 ENCOUNTER — Other Ambulatory Visit (HOSPITAL_COMMUNITY): Payer: Self-pay | Admitting: Family Medicine

## 2018-02-16 DIAGNOSIS — M7541 Impingement syndrome of right shoulder: Secondary | ICD-10-CM

## 2018-02-18 ENCOUNTER — Encounter: Payer: Self-pay | Admitting: Family Medicine

## 2018-02-18 NOTE — Assessment & Plan Note (Signed)
CAD s/p  CABG with new non specific chest pain and reduce exercise tolerance , cardiology to evaluate pt

## 2018-02-18 NOTE — Assessment & Plan Note (Signed)
Not suicidal or homicidal, would benefit from therapy but not interested currently, continue current medication

## 2018-02-18 NOTE — Progress Notes (Signed)
Tammy Suarez     MRN: [id number]      DOB: 1945-11-19   HPI Tammy Suarez is here for follow up and re-evaluation of chronic medical conditions, medication management and review of any available recent lc/o new intermittent chest discomfort , which may occur with or without activity, and also notes increase fatigue, she has CAd and continues to smoke, needs cardiology evaluation, no current pain C/o chronic stress and anxiety due to family circumstances, however she has learned to live with this still distressed about not being able to see a little boy she had started raising C/o chronic back pain, now enroled at a pain clinic  ROS Denies recent fever or chills. Denies sinus pressure, nasal congestion, ear pain or sore throat. Denies chest congestion, productive cough c/o intermittent  wheezing.c/o shortness of breath Denies , palpitations and does c/o mild  leg swelling Denies abdominal pain, nausea, vomiting,diarrhea or constipation.  C/o chronic back pain and joint pain with limitation in mobility  Denies dysuria, frequency, hesitancy does c/o  incontinence. C/o c/o depression, anxiety or insomnia. Denies skin break down or rash.   PE  BP 140/82   Pulse 67   Resp 16   Ht 5\' 5"  (1.651 m)   Wt 189 lb (85.7 kg)   SpO2 96%   BMI 31.45 kg/m   Patient alert and oriented and in no cardiopulmonary distress.  HEENT: No facial asymmetry, EOMI,   oropharynx pink and moist.  Neck supple no JVD, no mass.  Chest: decreased air entry scattered crackles , no wheezes No reproducible chestr wall pain CVS: S1, S2 no murmurs, no S3.Regular rate.  ABD: Soft non tender.   Ext: No edema  MS: decreased  ROM spine, shoulders, hips and knees.  Skin: Intact, no ulcerations or rash noted.  Psych: Good eye contact, normal affect. Memory intact not anxious mildly  depressed appearing.  CNS: CN 2-12 intact, power,  normal throughout.no focal deficits noted.   Assessment & Plan  Coronary  atherosclerosis of native coronary artery CAD s/p  CABG with new non specific chest pain and reduce exercise tolerance , cardiology to evaluate pt  Nicotine dependence Asked: confirms currently smokes cigarettes Assess: unwilling to committing to quit now, states needs to smoke for her nerves, wiling to try to cut down Advise : needs to quit to  reduce risk of MI and death, already has hd CABG, also stroke and cancer risk and lung disease Assist: continue wellborn, call 1800 [place], counseled for 5 mins and info on ;loical cessation classes provided Arrange 4 month f/u  Morbid obesity Deteriorated. Patient re-educated about  the importance of commitment to a  minimum of 150 minutes of exercise per week.  The importance of healthy food choices with portion control discussed. Encouraged to start a food diary, count calories and to consider  joining a support group. Sample diet sheets offered. Goals set by the patient for the next several months.   Weight /BMI 02/04/2018 01/25/2018 09/22/2017  WEIGHT 190 lb 189 lb 188 lb  HEIGHT 5\' 5"  5\' 5"  5\' 5"   BMI 31.62 kg/m2 31.45 kg/m2 31.28 kg/m2      Essential hypertension, benign Sub optimal control, nom med change , needs to work on lifestyle modification DASH diet and commitment to daily physical activity for a minimum of 30 minutes discussed and encouraged, as a part of hypertension management. The importance of attaining a healthy weight is also discussed.  BP/Weight 02/04/2018 01/25/2018 10/29/2017 09/22/2017  07/05/2017 06/14/2017 6/94/8546  Systolic BP 270 350 093 818 299 371 696  Diastolic BP 73 82 78 70 72 68 83  Wt. (Lbs) 190 189 - 188 191.12 191 191  BMI 31.62 31.45 - 31.28 31.8 31.78 31.78       DM (diabetes mellitus), type 2 with renal complications Tammy Suarez is reminded of the importance of commitment to daily physical activity for 30 minutes or more, as able and the need to limit carbohydrate intake to 30 to 60 grams per meal  to help with blood sugar control.   Tammy Suarez is reminded of the importance of daily foot exam, annual eye examination, and good blood sugar, blood pressure and cholesterol control. Controlled, no change inmangement.  Diabetic Labs Latest Ref Rng & Units 02/04/2018 10/29/2017 04/22/2017 01/11/2017 11/26/2016  HbA1c <5.7 % of total Hgb - 6.7(H) 6.2(H) - 6.3(H)  Microalbumin mg/dL - 13.3 - 4.5 -  Micro/Creat Ratio <30 mcg/mg creat - 132(H) - 49(H) -  Chol <200 mg/dL - 129 107 - 161  HDL >50 mg/dL - 23(L) 23(L) - 30(L)  Calc LDL mg/dL (calc) - 75 47 - 88  Triglycerides <150 mg/dL - 216(H) 185(H) - 216(H)  Creatinine 0.44 - 1.00 mg/dL 1.44(H) 1.27(H) 1.55(H) 1.43(H) 1.84(H)   BP/Weight 02/04/2018 01/25/2018 10/29/2017 09/22/2017 07/05/2017 06/14/2017 7/89/3810  Systolic BP 175 102 585 277 824 235 361  Diastolic BP 73 82 78 70 72 68 83  Wt. (Lbs) 190 189 - 188 191.12 191 191  BMI 31.62 31.45 - 31.28 31.8 31.78 31.78   Foot/eye exam completion dates Latest Ref Rng & Units 09/09/2016 02/05/2016  Eye Exam No Retinopathy - No Retinopathy  Foot exam Order - - -  Foot Form Completion - Done -        Depression with anxiety Not suicidal or homicidal, would benefit from therapy but not interested currently, continue current medication  Spinal stenosis of lumbar region with radiculopathy Currently in the care of pain clinic

## 2018-02-18 NOTE — Assessment & Plan Note (Signed)
Currently in the care of pain clinic

## 2018-02-18 NOTE — Assessment & Plan Note (Signed)
Sub optimal control, nom med change , needs to work on lifestyle modification DASH diet and commitment to daily physical activity for a minimum of 30 minutes discussed and encouraged, as a part of hypertension management. The importance of attaining a healthy weight is also discussed.  BP/Weight 02/04/2018 01/25/2018 10/29/2017 09/22/2017 07/05/2017 06/14/2017 5/73/2202  Systolic BP 542 706 237 628 315 176 160  Diastolic BP 73 82 78 70 72 68 83  Wt. (Lbs) 190 189 - 188 191.12 191 191  BMI 31.62 31.45 - 31.28 31.8 31.78 31.78

## 2018-02-18 NOTE — Assessment & Plan Note (Signed)
Tammy Suarez is reminded of the importance of commitment to daily physical activity for 30 minutes or more, as able and the need to limit carbohydrate intake to 30 to 60 grams per meal to help with blood sugar control.   Tammy Suarez is reminded of the importance of daily foot exam, annual eye examination, and good blood sugar, blood pressure and cholesterol control. Controlled, no change inmangement.  Diabetic Labs Latest Ref Rng & Units 02/04/2018 10/29/2017 04/22/2017 01/11/2017 11/26/2016  HbA1c <5.7 % of total Hgb - 6.7(H) 6.2(H) - 6.3(H)  Microalbumin mg/dL - 13.3 - 4.5 -  Micro/Creat Ratio <30 mcg/mg creat - 132(H) - 49(H) -  Chol <200 mg/dL - 129 107 - 161  HDL >50 mg/dL - 23(L) 23(L) - 30(L)  Calc LDL mg/dL (calc) - 75 47 - 88  Triglycerides <150 mg/dL - 216(H) 185(H) - 216(H)  Creatinine 0.44 - 1.00 mg/dL 1.44(H) 1.27(H) 1.55(H) 1.43(H) 1.84(H)   BP/Weight 02/04/2018 01/25/2018 10/29/2017 09/22/2017 07/05/2017 06/14/2017 1/64/3539  Systolic BP 122 583 462 194 712 527 129  Diastolic BP 73 82 78 70 72 68 83  Wt. (Lbs) 190 189 - 188 191.12 191 191  BMI 31.62 31.45 - 31.28 31.8 31.78 31.78   Foot/eye exam completion dates Latest Ref Rng & Units 09/09/2016 02/05/2016  Eye Exam No Retinopathy - No Retinopathy  Foot exam Order - - -  Foot Form Completion - Done -

## 2018-02-18 NOTE — Assessment & Plan Note (Signed)
Deteriorated. Patient re-educated about  the importance of commitment to a  minimum of 150 minutes of exercise per week.  The importance of healthy food choices with portion control discussed. Encouraged to start a food diary, count calories and to consider  joining a support group. Sample diet sheets offered. Goals set by the patient for the next several months.   Weight /BMI 02/04/2018 01/25/2018 09/22/2017  WEIGHT 190 lb 189 lb 188 lb  HEIGHT 5\' 5"  5\' 5"  5\' 5"   BMI 31.62 kg/m2 31.45 kg/m2 31.28 kg/m2

## 2018-02-18 NOTE — Assessment & Plan Note (Signed)
Asked: confirms currently smokes cigarettes Assess: unwilling to committing to quit now, states needs to smoke for her nerves, wiling to try to cut down Advise : needs to quit to  reduce risk of MI and death, already has hd CABG, also stroke and cancer risk and lung disease Assist: continue wellborn, call [place], counseled for 5 mins and info on ;loical cessation classes provided Arrange 4 month f/u

## 2018-02-21 ENCOUNTER — Encounter (HOSPITAL_COMMUNITY): Payer: Self-pay

## 2018-02-21 ENCOUNTER — Ambulatory Visit (HOSPITAL_COMMUNITY): Payer: Medicare HMO

## 2018-03-07 ENCOUNTER — Ambulatory Visit: Payer: Self-pay

## 2018-03-08 NOTE — Progress Notes (Signed)
Lab only 

## 2018-03-08 NOTE — Progress Notes (Deleted)
Cardiology Office Note  Date: 03/08/2018   ID: Tammy Suarez, DOB 22-Sep-1946, MRN [id number]  PCP: Fayrene Helper, MD  Primary Cardiologist: Rozann Lesches, MD   No chief complaint on file.   History of Present Illness: Tammy Suarez is a 72 y.o. female last seen in July 2018.  Past Medical History:  Diagnosis Date  . Arthritis   . Carpal tunnel syndrome    bilaterally  . Chronic back pain   . CKD (chronic kidney disease) stage 3, GFR 30-59 ml/min (HCC)   . Coronary atherosclerosis of native coronary artery    Multivessel status post CABG - LIMA to LAD, SVG to OM and SVG to PDA  . Depression   . Essential hypertension, benign   . GERD (gastroesophageal reflux disease)   . Headache   . History of conjunctivitis   . History of hiatal hernia   . Hyperlipidemia   . Hypothyroidism   . Myocardial infarction ([place])   . Obesity   . Vitamin B 12 deficiency     Past Surgical History:  Procedure Laterality Date  . ABDOMINAL HYSTERECTOMY    . BACK SURGERY    . CARDIAC SURGERY    . CARPAL TUNNEL RELEASE Left 07/23/2016   Procedure: CARPAL TUNNEL RELEASE;  Surgeon: Carole Civil, MD;  Location: [facility];  Service: Orthopedics;  Laterality: Left;  . CERVICAL LAMINECTOMY    . CHOLECYSTECTOMY    . CORONARY ARTERY BYPASS GRAFT N/A 11/02/2013   Procedure: CORONARY ARTERY BYPASS GRAFTING (CABG);  Surgeon: Gaye Pollack, MD;  Location: [facility];  Service: Open Heart Surgery;  Laterality: N/A;  CABG x three, using left internal mammary artery and right leg greater saphenous vein harvested endoscopically  . COSMETIC SURGERY  07/01/2012   Recurrent skin infection on lower abdomen, [facility]  . HERNIA REPAIR    . Inguinal herniorrhaphy    . INTRAOPERATIVE TRANSESOPHAGEAL ECHOCARDIOGRAM N/A 11/02/2013   Procedure: INTRAOPERATIVE TRANSESOPHAGEAL ECHOCARDIOGRAM;  Surgeon: Gaye Pollack, MD;  [facility];  Service: Open Heart Surgery;  Laterality: N/A;  . LAPAROSCOPIC GASTRIC  BANDING  2009  . LEFT HEART CATHETERIZATION WITH CORONARY ANGIOGRAM N/A 10/30/2013   Procedure: LEFT HEART CATHETERIZATION WITH CORONARY ANGIOGRAM;  Surgeon: Minus Breeding, MD;  Location: [facility] CATH LAB;  Service: Cardiovascular;  Laterality: N/A;  . Left index finger repair    . LUMBAR FUSION    . SPINE SURGERY    . VESICOVAGINAL FISTULA CLOSURE W/ TAH      Current Outpatient Medications  Medication Sig Dispense Refill  . amitriptyline (ELAVIL) 25 MG tablet TAKE 1 TABLET BY MOUTH AT BEDTIME 90 tablet 1  . Aspirin-Acetaminophen-Caffeine (GOODY HEADACHE PO) Take 1 Package by mouth 2 (two) times daily as needed (headache).    [place] Kitchen buPROPion (WELLBUTRIN XL) 300 MG 24 hr tablet Take 1 tablet (300 mg total) daily by mouth. 30 tablet 5  . clonazePAM (KLONOPIN) 1 MG tablet Take 1 tablet (1 mg total) 3 (three) times daily by mouth. 90 tablet 5  . DULoxetine (CYMBALTA) 60 MG capsule TAKE ONE CAPSULE BY MOUTH TWICE A DAY 180 capsule 1  . fenofibrate (TRICOR) 145 MG tablet Take 1 tablet (145 mg total) daily by mouth. 90 tablet 1  . furosemide (LASIX) 20 MG tablet One tablet dailyu as needed for leg swelling 30 tablet 0  . loratadine (CLARITIN) 10 MG tablet Take 1 tablet (10 mg total) by mouth daily as needed for allergies. 30 tablet 6  .  metoprolol succinate (TOPROL-XL) 25 MG 24 hr tablet Take 1 tablet (25 mg total) daily by mouth. 90 tablet 3  . Multiple Vitamin (MULTIVITAMIN) tablet Take 1 tablet by mouth daily.    . naproxen (NAPROSYN) 500 MG tablet Take 1 tablet (500 mg total) by mouth 2 (two) times daily with a meal. 10 tablet 0  . nitroGLYCERIN (NITROSTAT) 0.4 MG SL tablet PLACE 1 TABLET UNDER THE TONGUE EVERY 5 MINUTES AS NEEDED FOR CHEST PAIN 25 tablet 3  . Omega-3 Fatty Acids (FISH OIL PO) Take 1 capsule by mouth daily.    . ranitidine (ZANTAC) 150 MG tablet TAKE 1 TABLET BY MOUTH TWICE DAILY 180 tablet 2  . rosuvastatin (CRESTOR) 40 MG tablet Take 1 tablet (40 mg total) daily by mouth. 90 tablet 1    No current facility-administered medications for this visit.    Allergies:  Gabapentin; Meperidine hcl; Niacin; and Propoxyphene n-acetaminophen   Social History: The patient  reports that she has been smoking cigarettes.  She has a 12.50 pack-year smoking history. She has never used smokeless tobacco. She reports that she does not drink alcohol or use drugs.   Family History: The patient's family history includes Cancer in her brother; Drug abuse in her daughter; Heart attack in her father, mother, and sister; Hepatitis in her daughter; Hypertension in her daughter and sister.   ROS:  Please see the history of present illness. Otherwise, complete review of systems is positive for {NONE DEFAULTED:18576::"none"}.  All other systems are reviewed and negative.   Physical Exam: VS:  There were no vitals taken for this visit., BMI There is no height or weight on file to calculate BMI.  Wt Readings from Last 3 Encounters:  02/04/18 190 lb (86.2 kg)  01/25/18 189 lb (85.7 kg)  09/22/17 188 lb (85.3 kg)    General: Patient appears comfortable at rest. HEENT: Conjunctiva and lids normal, oropharynx clear with moist mucosa. Neck: Supple, no elevated JVP or carotid bruits, no thyromegaly. Lungs: Clear to auscultation, nonlabored breathing at rest. Cardiac: Regular rate and rhythm, no S3 or significant systolic murmur, no pericardial rub. Abdomen: Soft, nontender, no hepatomegaly, bowel sounds present, no guarding or rebound. Extremities: No pitting edema, distal pulses 2+. Skin: Warm and dry. Musculoskeletal: No kyphosis. Neuropsychiatric: Alert and oriented x3, affect grossly appropriate.  ECG: I personally reviewed the tracing from 02/04/2018 which showed sinus bradycardia with PAC, decreased R wave progression, diffuse nonspecific ST-T wave abnormalities..  Recent Labwork: 10/29/2017: ALT 8; AST 15 01/25/2018: TSH 0.92 02/04/2018: BUN 27; Creatinine, Ser 1.44; Hemoglobin 11.6; Platelets  195; Potassium 3.9; Sodium 138     Component Value Date/Time   CHOL 129 10/29/2017 1059   TRIG 216 (H) 10/29/2017 1059   HDL 23 (L) 10/29/2017 1059   CHOLHDL 5.6 (H) 10/29/2017 1059   VLDL 37 (H) 04/22/2017 0952   LDLCALC 75 10/29/2017 1059    Other Studies Reviewed Today:  Echocardiogram 11/01/2013: Study Conclusions  Left ventricle: The cavity size was normal. Wall thickness was normal. Systolic function was normal. The estimated ejection fraction was in the range of 55% to 60%. Wall motion was normal; there were no regional wall motion abnormalities. There was an increased relative contribution of atrial contraction to ventricular filling.  Assessment and Plan:   Current medicines were reviewed with the patient today.  No orders of the defined types were placed in this encounter.   Disposition:  Signed, Satira Sark, MD, [facility] 03/08/2018 9:58 AM  [facility] [address], [place], [place] [address] Phone: [phone]; Fax: [phone]

## 2018-03-10 ENCOUNTER — Ambulatory Visit: Payer: Self-pay | Admitting: Cardiology

## 2018-03-11 DIAGNOSIS — G8929 Other chronic pain: Secondary | ICD-10-CM | POA: Diagnosis not present

## 2018-03-11 DIAGNOSIS — M25511 Pain in right shoulder: Secondary | ICD-10-CM | POA: Diagnosis not present

## 2018-03-11 DIAGNOSIS — Z79899 Other long term (current) drug therapy: Secondary | ICD-10-CM | POA: Diagnosis not present

## 2018-03-11 DIAGNOSIS — M25512 Pain in left shoulder: Secondary | ICD-10-CM | POA: Diagnosis not present

## 2018-03-11 DIAGNOSIS — M5030 Other cervical disc degeneration, unspecified cervical region: Secondary | ICD-10-CM | POA: Diagnosis not present

## 2018-03-17 ENCOUNTER — Other Ambulatory Visit: Payer: Self-pay | Admitting: Family Medicine

## 2018-03-21 ENCOUNTER — Other Ambulatory Visit: Payer: Self-pay | Admitting: Family Medicine

## 2018-03-21 MED ORDER — CLONAZEPAM 1 MG PO TABS: ORAL_TABLET | ORAL | 1 refills | Status: DC

## 2018-04-01 ENCOUNTER — Ambulatory Visit (INDEPENDENT_AMBULATORY_CARE_PROVIDER_SITE_OTHER): Payer: Medicare HMO

## 2018-04-01 VITALS — BP 142/80 | HR 81 | Resp 16 | Ht 65.0 in | Wt 187.0 lb

## 2018-04-01 DIAGNOSIS — E1121 Type 2 diabetes mellitus with diabetic nephropathy: Secondary | ICD-10-CM | POA: Diagnosis not present

## 2018-04-01 DIAGNOSIS — Z0000 Encounter for general adult medical examination without abnormal findings: Secondary | ICD-10-CM

## 2018-04-01 DIAGNOSIS — E785 Hyperlipidemia, unspecified: Secondary | ICD-10-CM | POA: Diagnosis not present

## 2018-04-01 DIAGNOSIS — I10 Essential (primary) hypertension: Secondary | ICD-10-CM | POA: Diagnosis not present

## 2018-04-01 NOTE — Patient Instructions (Signed)
Tammy Suarez , Thank you for taking time to come for your Medicare Wellness Visit. I appreciate your ongoing commitment to your health goals. Please review the following plan we discussed and let me know if I can assist you in the future.   Screening recommendations/referrals: Colonoscopy: overdue  Mammogram: overdue  Bone Density: done  Recommended yearly ophthalmology/optometry visit for glaucoma screening and checkup Recommended yearly dental visit for hygiene and checkup  Vaccinations: Influenza vaccine: done  Pneumococcal vaccine: done  Tdap vaccine: done  Shingles vaccine: done     Advanced directives: form given   Conditions/risks identified: done   Next appointment: scheduled 05/05/2018   Preventive Care 6 Years and Older, Female Preventive care refers to lifestyle choices and visits with your health care provider that can promote health and wellness. What does preventive care include?  A yearly physical exam. This is also called an annual well check.  Dental exams once or twice a year.  Routine eye exams. Ask your health care provider how often you should have your eyes checked.  Personal lifestyle choices, including:  Daily care of your teeth and gums.  Regular physical activity.  Eating a healthy diet.  Avoiding tobacco and drug use.  Limiting alcohol use.  Practicing safe sex.  Taking low-dose aspirin every day.  Taking vitamin and mineral supplements as recommended by your health care provider. What happens during an annual well check? The services and screenings done by your health care provider during your annual well check will depend on your age, overall health, lifestyle risk factors, and family history of disease. Counseling  Your health care provider may ask you questions about your:  Alcohol use.  Tobacco use.  Drug use.  Emotional well-being.  Home and relationship well-being.  Sexual activity.  Eating habits.  History of  falls.  Memory and ability to understand (cognition).  Work and work Statistician.  Reproductive health. Screening  You may have the following tests or measurements:  Height, weight, and BMI.  Blood pressure.  Lipid and cholesterol levels. These may be checked every 5 years, or more frequently if you are over 62 years old.  Skin check.  Lung cancer screening. You may have this screening every year starting at age 38 if you have a 30-pack-year history of smoking and currently smoke or have quit within the past 15 years.  Fecal occult blood test (FOBT) of the stool. You may have this test every year starting at age 81.  Flexible sigmoidoscopy or colonoscopy. You may have a sigmoidoscopy every 5 years or a colonoscopy every 10 years starting at age 82.  Hepatitis C blood test.  Hepatitis B blood test.  Sexually transmitted disease (STD) testing.  Diabetes screening. This is done by checking your blood sugar (glucose) after you have not eaten for a while (fasting). You may have this done every 1-3 years.  Bone density scan. This is done to screen for osteoporosis. You may have this done starting at age 26.  Mammogram. This may be done every 1-2 years. Talk to your health care provider about how often you should have regular mammograms. Talk with your health care provider about your test results, treatment options, and if necessary, the need for more tests. Vaccines  Your health care provider may recommend certain vaccines, such as:  Influenza vaccine. This is recommended every year.  Tetanus, diphtheria, and acellular pertussis (Tdap, Td) vaccine. You may need a Td booster every 10 years.  Zoster vaccine. You may need  this after age 7.  Pneumococcal 13-valent conjugate (PCV13) vaccine. One dose is recommended after age 81.  Pneumococcal polysaccharide (PPSV23) vaccine. One dose is recommended after age 64. Talk to your health care provider about which screenings and  vaccines you need and how often you need them. This information is not intended to replace advice given to you by your health care provider. Make sure you discuss any questions you have with your health care provider. Document Released: 11/29/2015 Document Revised: 07/22/2016 Document Reviewed: 09/03/2015 Elsevier Interactive Patient Education  2017 [place] Prevention in the Home Falls can cause injuries. They can happen to people of all ages. There are many things you can do to make your home safe and to help prevent falls. What can I do on the outside of my home?  Regularly fix the edges of walkways and driveways and fix any cracks.  Remove anything that might make you trip as you walk through a door, such as a raised step or threshold.  Trim any bushes or trees on the path to your home.  Use bright outdoor lighting.  Clear any walking paths of anything that might make someone trip, such as rocks or tools.  Regularly check to see if handrails are loose or broken. Make sure that both sides of any steps have handrails.  Any raised decks and porches should have guardrails on the edges.  Have any leaves, snow, or ice cleared regularly.  Use sand or salt on walking paths during winter.  Clean up any spills in your garage right away. This includes oil or grease spills. What can I do in the bathroom?  Use night lights.  Install grab bars by the toilet and in the tub and shower. Do not use towel bars as grab bars.  Use non-skid mats or decals in the tub or shower.  If you need to sit down in the shower, use a plastic, non-slip stool.  Keep the floor dry. Clean up any water that spills on the floor as soon as it happens.  Remove soap buildup in the tub or shower regularly.  Attach bath mats securely with double-sided non-slip rug tape.  Do not have throw rugs and other things on the floor that can make you trip. What can I do in the bedroom?  Use night  lights.  Make sure that you have a light by your bed that is easy to reach.  Do not use any sheets or blankets that are too big for your bed. They should not hang down onto the floor.  Have a firm chair that has side arms. You can use this for support while you get dressed.  Do not have throw rugs and other things on the floor that can make you trip. What can I do in the kitchen?  Clean up any spills right away.  Avoid walking on wet floors.  Keep items that you use a lot in easy-to-reach places.  If you need to reach something above you, use a strong step stool that has a grab bar.  Keep electrical cords out of the way.  Do not use floor polish or wax that makes floors slippery. If you must use wax, use non-skid floor wax.  Do not have throw rugs and other things on the floor that can make you trip. What can I do with my stairs?  Do not leave any items on the stairs.  Make sure that there are handrails on both sides of  the stairs and use them. Fix handrails that are broken or loose. Make sure that handrails are as long as the stairways.  Check any carpeting to make sure that it is firmly attached to the stairs. Fix any carpet that is loose or worn.  Avoid having throw rugs at the top or bottom of the stairs. If you do have throw rugs, attach them to the floor with carpet tape.  Make sure that you have a light switch at the top of the stairs and the bottom of the stairs. If you do not have them, ask someone to add them for you. What else can I do to help prevent falls?  Wear shoes that:  Do not have high heels.  Have rubber bottoms.  Are comfortable and fit you well.  Are closed at the toe. Do not wear sandals.  If you use a stepladder:  Make sure that it is fully opened. Do not climb a closed stepladder.  Make sure that both sides of the stepladder are locked into place.  Ask someone to hold it for you, if possible.  Clearly mark and make sure that you can  see:  Any grab bars or handrails.  First and last steps.  Where the edge of each step is.  Use tools that help you move around (mobility aids) if they are needed. These include:  Canes.  Walkers.  Scooters.  Crutches.  Turn on the lights when you go into a dark area. Replace any light bulbs as soon as they burn out.  Set up your furniture so you have a clear path. Avoid moving your furniture around.  If any of your floors are uneven, fix them.  If there are any pets around you, be aware of where they are.  Review your medicines with your doctor. Some medicines can make you feel dizzy. This can increase your chance of falling. Ask your doctor what other things that you can do to help prevent falls. This information is not intended to replace advice given to you by your health care provider. Make sure you discuss any questions you have with your health care provider. Document Released: 08/29/2009 Document Revised: 04/09/2016 Document Reviewed: 12/07/2014 Elsevier Interactive Patient Education  2017 [employer].

## 2018-04-01 NOTE — Progress Notes (Signed)
Subjective:   Tammy Suarez is a 72 y.o. female who presents for Medicare Annual (Subsequent) preventive examination.  Review of Systems:  Cardiac Risk Factors include: advanced age (>54men, >71 women);diabetes mellitus;dyslipidemia;sedentary lifestyle;hypertension;obesity (BMI >30kg/m2)     Objective:     Vitals: BP (!) 142/80   Pulse 81   Resp 16   Ht 5\' 5"  (1.651 m)   Wt 187 lb (84.8 kg)   SpO2 96%   BMI 31.12 kg/m   Body mass index is 31.12 kg/m.  Advanced Directives 07/05/2017 05/05/2017 07/23/2016 07/17/2016 06/17/2016 03/14/2015 11/02/2013  Does Patient Have a Medical Advance Directive? No No No No No No Patient does not have advance directive  Would patient like information on creating a medical advance directive? Yes (MAU/Ambulatory/Procedural Areas - Information given) No - Patient declined No - patient declined information Yes - Scientist, clinical (histocompatibility and immunogenetics) given - Yes - Educational materials given -  Pre-existing out of facility DNR order (yellow form or pink MOST form) - - - - - - No    Tobacco Social History   Tobacco Use  Smoking Status Current Every Day Smoker  . Packs/day: 0.25  . Years: 50.00  . Pack years: 12.50  . Types: Cigarettes  Smokeless Tobacco Never Used  Tobacco Comment   1 PPD smoker for 2 years      Ready to quit: Not Answered Counseling given: Not Answered Comment: 1 PPD smoker for 2 years    Clinical Intake:  Pre-visit preparation completed: Yes  Pain : 0-10 Pain Score: 8  Pain Type: Chronic pain Pain Location: Other (Comment)(pt states all over pain ) Effect of Pain on Daily Activities: hard to get up and do like she would like      Nutritional Status: BMI > 30  Obese Diabetes: Yes CBG done?: No Did pt. bring in CBG monitor from home?: No  How often do you need to have someone help you when you read instructions, pamphlets, or other written materials from your doctor or pharmacy?: 1 - Never What is the last grade level you completed  in school?: GED  Interpreter Needed?: No  Information entered by :: Kate Sable, LPN   Past Medical History:  Diagnosis Date  . Arthritis   . Carpal tunnel syndrome    bilaterally  . Chronic back pain   . CKD (chronic kidney disease) stage 3, GFR 30-59 ml/min (HCC)   . Coronary atherosclerosis of native coronary artery    Multivessel status post CABG - LIMA to LAD, SVG to OM and SVG to PDA  . Depression   . Essential hypertension, benign   . GERD (gastroesophageal reflux disease)   . Headache   . History of conjunctivitis   . History of hiatal hernia   . Hyperlipidemia   . Hypothyroidism   . Myocardial infarction ([place])   . Obesity   . Vitamin B 12 deficiency    Past Surgical History:  Procedure Laterality Date  . ABDOMINAL HYSTERECTOMY    . BACK SURGERY    . CARDIAC SURGERY    . CARPAL TUNNEL RELEASE Left 07/23/2016   Procedure: CARPAL TUNNEL RELEASE;  Surgeon: Carole Civil, MD;  Location: [facility];  Service: Orthopedics;  Laterality: Left;  . CERVICAL LAMINECTOMY    . CHOLECYSTECTOMY    . CORONARY ARTERY BYPASS GRAFT N/A 11/02/2013   Procedure: CORONARY ARTERY BYPASS GRAFTING (CABG);  Surgeon: Gaye Pollack, MD;  Location: [facility];  Service: Open Heart Surgery;  Laterality: N/A;  CABG x three, using left internal mammary artery and right leg greater saphenous vein harvested endoscopically  . COSMETIC SURGERY  07/01/2012   Recurrent skin infection on lower abdomen, [facility]  . HERNIA REPAIR    . Inguinal herniorrhaphy    . INTRAOPERATIVE TRANSESOPHAGEAL ECHOCARDIOGRAM N/A 11/02/2013   Procedure: INTRAOPERATIVE TRANSESOPHAGEAL ECHOCARDIOGRAM;  Surgeon: Gaye Pollack, MD;  Location: [facility];  Service: Open Heart Surgery;  Laterality: N/A;  . LAPAROSCOPIC GASTRIC BANDING  2009  . LEFT HEART CATHETERIZATION WITH CORONARY ANGIOGRAM N/A 10/30/2013   Procedure: LEFT HEART CATHETERIZATION WITH CORONARY ANGIOGRAM;  Surgeon: Minus Breeding, MD;  Location: [facility] C[facility] LAB;  Service:  Cardiovascular;  Laterality: N/A;  . Left index finger repair    . LUMBAR FUSION    . SPINE SURGERY    . VESICOVAGINAL FISTULA CLOSURE W/ TAH     Family History  Problem Relation Age of Onset  . Heart attack Mother   . Heart attack Father   . Hypertension Sister   . Heart attack Sister   . Cancer Brother        Renal cell   . Hepatitis Daughter   . Drug abuse Daughter   . Hypertension Daughter    Social History   Socioeconomic History  . Marital status: Single    Spouse name: Not on file  . Number of children: 3  . Years of education: Not on file  . Highest education level: GED or equivalent  Occupational History  . Not on file  Social Needs  . Financial resource strain: Very hard  . Food insecurity:    Worry: Often true    Inability: Sometimes true  . Transportation needs:    Medical: Yes    Non-medical: Yes  Tobacco Use  . Smoking status: Current Every Day Smoker    Packs/day: 0.25    Years: 50.00    Pack years: 12.50    Types: Cigarettes  . Smokeless tobacco: Never Used  . Tobacco comment: 1 PPD smoker for 2 years   Substance and Sexual Activity  . Alcohol use: No    Alcohol/week: 0.0 oz  . Drug use: No  . Sexual activity: Never    Birth control/protection: Surgical  Lifestyle  . Physical activity:    Days per week: 0 days    Minutes per session: 0 min  . Stress: Very much  Relationships  . Social connections:    Talks on phone: Twice a week    Gets together: More than three times a week    Attends religious service: Not on file    Active member of club or organization: Yes    Attends meetings of clubs or organizations: 1 to 4 times per year    Relationship status: Separated  Other Topics Concern  . Not on file  Social History Narrative  . Not on file    Outpatient Encounter Medications as of 04/01/2018  Medication Sig  . amitriptyline (ELAVIL) 25 MG tablet TAKE 1 TABLET BY MOUTH AT BEDTIME  . Aspirin-Acetaminophen-Caffeine (GOODY HEADACHE PO)  Take 1 Package by mouth 2 (two) times daily as needed (headache).  Marland Kitchen buPROPion (WELLBUTRIN XL) 300 MG 24 hr tablet Take 1 tablet (300 mg total) daily by mouth.  . clonazePAM (KLONOPIN) 1 MG tablet One tablet three times daily for anxiety  . DULoxetine (CYMBALTA) 60 MG capsule TAKE ONE CAPSULE BY MOUTH TWICE A DAY  . fenofibrate (TRICOR) 145 MG tablet Take 1 tablet (145 mg total) daily by  mouth.  . furosemide (LASIX) 20 MG tablet One tablet dailyu as needed for leg swelling  . loratadine (CLARITIN) 10 MG tablet Take 1 tablet (10 mg total) by mouth daily as needed for allergies.  . metoprolol succinate (TOPROL-XL) 25 MG 24 hr tablet Take 1 tablet (25 mg total) daily by mouth.  . Multiple Vitamin (MULTIVITAMIN) tablet Take 1 tablet by mouth daily.  . naproxen (NAPROSYN) 500 MG tablet Take 1 tablet (500 mg total) by mouth 2 (two) times daily with a meal.  . nitroGLYCERIN (NITROSTAT) 0.4 MG SL tablet PLACE 1 TABLET UNDER THE TONGUE EVERY 5 MINUTES AS NEEDED FOR CHEST PAIN  . Omega-3 Fatty Acids (FISH OIL PO) Take 1 capsule by mouth daily.  . ranitidine (ZANTAC) 150 MG tablet TAKE 1 TABLET BY MOUTH TWICE DAILY  . rosuvastatin (CRESTOR) 40 MG tablet Take 1 tablet (40 mg total) daily by mouth.   No facility-administered encounter medications on file as of 04/01/2018.     Activities of Daily Living In your present state of health, do you have any difficulty performing the following activities: 04/01/2018 07/05/2017  Hearing? N Y  Comment - [facility]  Vision? N Y  Comment - recommend follow up with Dr. Precious Bard  Difficulty concentrating or making decisions? N N  Walking or climbing stairs? Y Y  Comment - difficulty climbing stairs  Dressing or bathing? N N  Doing errands, shopping? N N  Preparing Food and eating ? N N  Using the Toilet? N N  In the past six months, have you accidently leaked urine? Y N  Do you have problems with loss of bowel  control? N N  Managing your Medications? N N  Managing your Finances? N N  Housekeeping or managing your Housekeeping? N N  Some recent data might be hidden    Patient Care Team: Fayrene Helper, MD as PCP - General Domenic Polite Aloha Gell, MD as PCP - Cardiology (Cardiology) Earnie Larsson, MD as Consulting Physician (Neurosurgery) Carole Civil, MD as Consulting Physician (Orthopedic Surgery) Phillips Odor, MD as Consulting Physician (Neurology)    Assessment:   This is a routine wellness examination for Tammy Suarez.  Exercise Activities and Dietary recommendations Current Exercise Habits: Home exercise routine, Type of exercise: (S) Other - see comments(works outside, gardening, mowing ), Time (Minutes): 40, Frequency (Times/Week): 5, Weekly Exercise (Minutes/Week): 200, Intensity: Mild  Goals    . Exercise 3x per week (30 min per time)     Recommend starting a routine exercise program at least 3 days a week for 30-45 minutes at a time as tolerated.          Fall Risk Fall Risk  04/01/2018 01/25/2018 07/05/2017 04/22/2017 06/23/2016  Falls in the past year? Yes Yes Yes Yes Yes  Number falls in past yr: 2 or more 1 2 or more 2 or more 2 or more  Injury with Fall? - - No Yes -  Risk Factor Category  - - - High Fall Risk -  Risk for fall due to : - - Impaired balance/gait - -  Follow up - - Falls evaluation completed;Education provided;Falls prevention discussed - -   Is the patient's home free of loose throw rugs in walkways, pet beds, electrical cords, etc?   yes      Grab bars in the bathroom? yes      Handrails on the stairs?   yes  Adequate lighting?   yes    Depression Screen PHQ 2/9 Scores 04/01/2018 09/22/2017 07/05/2017 01/11/2017  PHQ - 2 Score 4 3 4 3   PHQ- 9 Score 8 11 6 13      Cognitive Function     6CIT Screen 07/05/2017  What Year? 0 points  What month? 0 points  What time? 0 points  Count back from 20 0 points  Months in reverse 0 points  Repeat phrase  0 points  Total Score 0    Immunization History  Administered Date(s) Administered  . Influenza Split 10/20/2011, 08/08/2012  . Influenza Whole 09/23/2009, 09/26/2010  . Influenza,inj,Quad PF,6+ Mos 07/28/2013, 09/03/2014, 10/02/2015, 09/09/2016, 09/27/2017  . Pneumococcal Conjugate-13 01/30/2015  . Pneumococcal Polysaccharide-23 01/30/2011  . Td 06/13/2010  . Zoster 12/09/2015    Qualifies for Shingles Vaccine? Ask insurance  Screening Tests Health Maintenance  Topic Date Due  . COLONOSCOPY  02/19/2016  . FOOT EXAM  09/09/2017  . HEMOGLOBIN A1C  04/29/2018  . INFLUENZA VACCINE  06/16/2018  . URINE MICROALBUMIN  10/29/2018  . MAMMOGRAM  11/26/2018  . OPHTHALMOLOGY EXAM  01/27/2019  . TETANUS/TDAP  06/13/2020  . DEXA SCAN  Completed  . Hepatitis C Screening  Completed  . PNA vac Low Risk Adult  Completed    Cancer Screenings: Lung: Low Dose CT Chest recommended if Age 76-80 years, 30 pack-year currently smoking OR have quit w/in 15years. Patient does qualify. Breast:  Up to date on Mammogram? No   Up to date of Bone Density/Dexa? Yes Colorectal: overdue since 02/19/2016  :      Plan:    I have personally reviewed and noted the following in the patient's chart:   . Medical and social history . Use of alcohol, tobacco or illicit drugs  . Current medications and supplements . Functional ability and status . Nutritional status . Physical activity . Advanced directives . List of other physicians . Hospitalizations, surgeries, and ER visits in previous 12 months . Vitals . Screenings to include cognitive, depression, and falls . Referrals and appointments  In addition, I have reviewed and discussed with patient certain preventive protocols, quality metrics, and best practice recommendations. A written personalized care plan for preventive services as well as general preventive health recommendations were provided to patient.     Kate Sable, LPN,  LPN  12/30/863

## 2018-04-02 LAB — COMPLETE METABOLIC PANEL WITH GFR
AG RATIO: 1.3 (calc) (ref 1.0–2.5)
ALBUMIN MSPROF: 3.9 g/dL (ref 3.6–5.1)
ALT: 11 U/L (ref 6–29)
AST: 21 U/L (ref 10–35)
Alkaline phosphatase (APISO): 69 U/L (ref 33–130)
BUN / CREAT RATIO: 25 (calc) — AB (ref 6–22)
BUN: 34 mg/dL — ABNORMAL HIGH (ref 7–25)
CO2: 29 mmol/L (ref 20–32)
Calcium: 10 mg/dL (ref 8.6–10.4)
Chloride: 101 mmol/L (ref 98–110)
Creat: 1.37 mg/dL — ABNORMAL HIGH (ref 0.60–0.93)
GFR, EST AFRICAN AMERICAN: 45 mL/min/{1.73_m2} — AB (ref >=60)
GFR, EST NON AFRICAN AMERICAN: 38 mL/min/{1.73_m2} — AB (ref >=60)
GLOBULIN: 2.9 g/dL (ref 1.9–3.7)
Glucose, Bld: 158 mg/dL — ABNORMAL HIGH (ref 65–99)
POTASSIUM: 4.6 mmol/L (ref 3.5–5.3)
SODIUM: 137 mmol/L (ref 135–146)
TOTAL PROTEIN: 6.8 g/dL (ref 6.1–8.1)
Total Bilirubin: 0.4 mg/dL (ref 0.2–1.2)

## 2018-04-02 LAB — LIPID PANEL
CHOL/HDL RATIO: 4.1 (calc) (ref <5.0)
CHOLESTEROL: 110 mg/dL (ref <200)
HDL: 27 mg/dL — AB (ref >50)
LDL Cholesterol (Calc): 51 mg/dL (calc)
Non-HDL Cholesterol (Calc): 83 mg/dL (calc) (ref <130)
TRIGLYCERIDES: 269 mg/dL — AB (ref <150)

## 2018-04-02 LAB — HEMOGLOBIN A1C
Hgb A1c MFr Bld: 6.9 % of total Hgb — ABNORMAL HIGH (ref <5.7)
MEAN PLASMA GLUCOSE: 151 (calc)
eAG (mmol/L): 8.4 (calc)

## 2018-04-08 DIAGNOSIS — M9983 Other biomechanical lesions of lumbar region: Secondary | ICD-10-CM | POA: Diagnosis not present

## 2018-04-08 DIAGNOSIS — M47816 Spondylosis without myelopathy or radiculopathy, lumbar region: Secondary | ICD-10-CM | POA: Diagnosis not present

## 2018-04-08 DIAGNOSIS — Z79899 Other long term (current) drug therapy: Secondary | ICD-10-CM | POA: Diagnosis not present

## 2018-04-08 DIAGNOSIS — M9981 Other biomechanical lesions of cervical region: Secondary | ICD-10-CM | POA: Diagnosis not present

## 2018-04-08 DIAGNOSIS — M5387 Other specified dorsopathies, lumbosacral region: Secondary | ICD-10-CM | POA: Diagnosis not present

## 2018-04-30 ENCOUNTER — Other Ambulatory Visit: Payer: Self-pay | Admitting: Family Medicine

## 2018-05-05 ENCOUNTER — Other Ambulatory Visit: Payer: Self-pay | Admitting: Family Medicine

## 2018-05-05 ENCOUNTER — Encounter: Payer: Self-pay | Admitting: Family Medicine

## 2018-05-05 ENCOUNTER — Ambulatory Visit (INDEPENDENT_AMBULATORY_CARE_PROVIDER_SITE_OTHER): Payer: Medicare HMO | Admitting: Family Medicine

## 2018-05-05 VITALS — BP 150/80 | HR 71 | Resp 16 | Ht 65.0 in | Wt 186.0 lb

## 2018-05-05 DIAGNOSIS — E1121 Type 2 diabetes mellitus with diabetic nephropathy: Secondary | ICD-10-CM | POA: Diagnosis not present

## 2018-05-05 DIAGNOSIS — F17218 Nicotine dependence, cigarettes, with other nicotine-induced disorders: Secondary | ICD-10-CM | POA: Diagnosis not present

## 2018-05-05 DIAGNOSIS — F418 Other specified anxiety disorders: Secondary | ICD-10-CM

## 2018-05-05 DIAGNOSIS — I10 Essential (primary) hypertension: Secondary | ICD-10-CM

## 2018-05-05 MED ORDER — LOSARTAN POTASSIUM 50 MG PO TABS: 50.0000 mg | ORAL_TABLET | Freq: Every day | ORAL | 3 refills | Status: DC

## 2018-05-05 MED ORDER — FLUOXETINE HCL 20 MG PO TABS: 20.0000 mg | ORAL_TABLET | Freq: Every day | ORAL | 3 refills | Status: DC
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
## 2018-05-05 NOTE — Patient Instructions (Signed)
F/ U in 6 to 8 weeks, call if you need me before  Please schedule mammogram at checkout  Please cut back gradually on cigarettes by one every other week  You are not alone in the fight for better health

## 2018-05-06 ENCOUNTER — Telehealth: Payer: Self-pay

## 2018-05-06 ENCOUNTER — Encounter: Payer: Self-pay | Admitting: Family Medicine

## 2018-05-06 DIAGNOSIS — F418 Other specified anxiety disorders: Secondary | ICD-10-CM
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
## 2018-05-06 NOTE — Assessment & Plan Note (Signed)
Asked: confirms currently smokes 15 cigarettes per day Assess: wants to quit but not readt Advise: needs to quit , has cAD, need to reduce risk of cancer and stroke Arrange f/u n 8 weeks Assist: counseled for 5 minutes, literature and QUIT NOW #provided

## 2018-05-06 NOTE — Assessment & Plan Note (Signed)
Improved Patient re-educated about  the importance of commitment to a  minimum of 150 minutes of exercise per week.  The importance of healthy food choices with portion control discussed. Encouraged to start a food diary, count calories and to consider  joining a support group. Sample diet sheets offered. Goals set by the patient for the next several months.   Weight /BMI 05/05/2018 04/01/2018 02/04/2018  WEIGHT 186 lb 187 lb 190 lb  HEIGHT 5\' 5"  5\' 5"  5\' 5"   BMI 30.95 kg/m2 31.12 kg/m2 31.62 kg/m2
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
## 2018-05-06 NOTE — Assessment & Plan Note (Signed)
Tammy Suarez is reminded of the importance of commitment to daily physical activity for 30 minutes or more, as able and the need to limit carbohydrate intake to 30 to 60 grams per meal to help with blood sugar control.   The need to take medication as prescribed, test blood sugar as directed, and to call between visits if there is a concern that blood sugar is uncontrolled is also discussed.   Tammy Suarez is reminded of the importance of daily foot exam, annual eye examination, and good blood sugar, blood pressure and cholesterol control. Deteriorated  Diabetic Labs Latest Ref Rng & Units 04/01/2018 02/04/2018 10/29/2017 04/22/2017 01/11/2017  HbA1c <5.7 % of total Hgb 6.9(H) - 6.7(H) 6.2(H) -  Microalbumin mg/dL - - 13.3 - 4.5  Micro/Creat Ratio <30 mcg/mg creat - - 132(H) - 49(H)  Chol <200 mg/dL 110 - 129 107 -  HDL >50 mg/dL 27(L) - 23(L) 23(L) -  Calc LDL mg/dL (calc) 51 - 75 47 -  Triglycerides <150 mg/dL 269(H) - 216(H) 185(H) -  Creatinine 0.60 - 0.93 mg/dL 1.37(H) 1.44(H) 1.27(H) 1.55(H) 1.43(H)   BP/Weight 05/05/2018 04/01/2018 02/04/2018 01/25/2018 10/29/2017 09/22/2017 9/73/5329  Systolic BP 924 268 341 962 229 798 921  Diastolic BP 80 80 73 82 78 70 72  Wt. (Lbs) 186 187 190 189 - 188 191.12  BMI 30.95 31.12 31.62 31.45 - 31.28 31.8   Foot/eye exam completion dates Latest Ref Rng & Units 01/26/2018 09/09/2016  Eye Exam No Retinopathy No Retinopathy -  Foot exam Order - - -  Foot Form Completion - - Done

## 2018-05-06 NOTE — Assessment & Plan Note (Signed)
Add fluoxetine 20 mg daily and refer to telepsych

## 2018-05-06 NOTE — Assessment & Plan Note (Signed)
Uncontrolled  And not at goal , medication adjusted with dose increase at this visit DASH diet and commitment to daily physical activity for a minimum of 30 minutes discussed and encouraged, as a part of hypertension management. The importance of attaining a healthy weight is also discussed.  BP/Weight 05/05/2018 04/01/2018 02/04/2018 01/25/2018 10/29/2017 09/22/2017 7/62/2633  Systolic BP 354 562 563 893 734 287 681  Diastolic BP 80 80 73 82 78 70 72  Wt. (Lbs) 186 187 190 189 - 188 191.12  BMI 30.95 31.12 31.62 31.45 - 31.28 31.8

## 2018-05-10 ENCOUNTER — Other Ambulatory Visit: Payer: Self-pay | Admitting: Family Medicine

## 2018-05-11 ENCOUNTER — Other Ambulatory Visit: Payer: Self-pay | Admitting: Family Medicine
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
## 2018-05-12 ENCOUNTER — Ambulatory Visit (HOSPITAL_COMMUNITY): Payer: Self-pay

## 2018-05-13 DIAGNOSIS — Z79899 Other long term (current) drug therapy: Secondary | ICD-10-CM | POA: Diagnosis not present

## 2018-05-13 DIAGNOSIS — M47816 Spondylosis without myelopathy or radiculopathy, lumbar region: Secondary | ICD-10-CM | POA: Diagnosis not present

## 2018-05-13 DIAGNOSIS — M19012 Primary osteoarthritis, left shoulder: Secondary | ICD-10-CM | POA: Diagnosis not present

## 2018-05-13 DIAGNOSIS — M5030 Other cervical disc degeneration, unspecified cervical region: Secondary | ICD-10-CM | POA: Diagnosis not present

## 2018-05-13 DIAGNOSIS — M5137 Other intervertebral disc degeneration, lumbosacral region: Secondary | ICD-10-CM | POA: Diagnosis not present

## 2018-05-13 NOTE — Progress Notes (Signed)
Cardiology Office Note  Date: 05/16/2018   ID: Tammy Suarez, DOB 1946-08-11, MRN [id number]  PCP: Fayrene Helper, MD  Primary Cardiologist: Rozann Lesches, MD   Chief Complaint  Patient presents with  . Coronary Artery Disease    History of Present Illness: Tammy Suarez is a 72 y.o. female last seen in July 2018.  She presents overdue for follow-up, referred back by Dr. Moshe Cipro.  He tells me that over the last few months she has been experiencing a difficult to describe sensation in her chest, has also had elevations in blood pressure.  Describes sort of a "weakness" without chest pain, tends to occur when she does her activities around the house.  She has taken a nitroglycerin occasionally.  I reviewed her medications.  She has not been consistent with aspirin, we discussed using an 81 mg dose daily.  Otherwise she is on Toprol-XL, Crestor, and was recently placed on Cozaar by Dr. Moshe Cipro.  She uses Lasix as needed.  I reviewed her recent lab work, LDL was 51.  She has not undergone ischemic testing since bypass surgery in 2014.  Past Medical History:  Diagnosis Date  . Arthritis   . Carpal tunnel syndrome    bilaterally  . Chronic back pain   . CKD (chronic kidney disease) stage 3, GFR 30-59 ml/min (HCC)   . Coronary atherosclerosis of native coronary artery    Multivessel status post CABG - LIMA to LAD, SVG to OM and SVG to PDA  . Depression   . Essential hypertension, benign   . GERD (gastroesophageal reflux disease)   . Headache   . History of conjunctivitis   . History of hiatal hernia   . Hyperlipidemia   . Hypothyroidism   . Myocardial infarction ([place])   . Obesity   . Vitamin B 12 deficiency     Past Surgical History:  Procedure Laterality Date  . ABDOMINAL HYSTERECTOMY    . BACK SURGERY    . CARDIAC SURGERY    . CARPAL TUNNEL RELEASE Left 07/23/2016   Procedure: CARPAL TUNNEL RELEASE;  Surgeon: Carole Civil, MD;  Location: [facility];   Service: Orthopedics;  Laterality: Left;  . CERVICAL LAMINECTOMY    . CHOLECYSTECTOMY    . CORONARY ARTERY BYPASS GRAFT N/A 11/02/2013   Procedure: CORONARY ARTERY BYPASS GRAFTING (CABG);  Surgeon: Gaye Pollack, MD;  Location: [facility];  Service: Open Heart Surgery;  Laterality: N/A;  CABG x three, using left internal mammary artery and right leg greater saphenous vein harvested endoscopically  . COSMETIC SURGERY  07/01/2012   Recurrent skin infection on lower abdomen, [facility]  . HERNIA REPAIR    . Inguinal herniorrhaphy    . INTRAOPERATIVE TRANSESOPHAGEAL ECHOCARDIOGRAM N/A 11/02/2013   Procedure: INTRAOPERATIVE TRANSESOPHAGEAL ECHOCARDIOGRAM;  Surgeon: Gaye Pollack, MD;  Location: [facility] OR;  Service: Open Heart Surgery;  Laterality: N/A;  . LAPAROSCOPIC GASTRIC BANDING  2009  . LEFT HEART CATHETERIZATION WITH CORONARY ANGIOGRAM N/A 10/30/2013   Procedure: LEFT HEART CATHETERIZATION WITH CORONARY ANGIOGRAM;  Surgeon: Minus Breeding, MD;  Location[facility] CATH LAB;  Service: Cardiovascular;  Laterality: N/A;  . Left index finger repair    . LUMBAR FUSION    . SPINE SURGERY    . VESICOVAGINAL FISTULA CLOSURE W/ TAH      Current Outpatient Medications  Medication Sig Dispense Refill  . aspirin EC 81 MG tablet Take 81 mg by mouth daily.    . Aspirin-Acetaminophen-Caffeine (GOODY HEADACHE PO)  Take 1 Package by mouth 2 (two) times daily as needed (headache).    . clonazePAM (KLONOPIN) 1 MG tablet One tablet three times daily for anxiety 90 tablet 1  . DULoxetine (CYMBALTA) 60 MG capsule TAKE ONE CAPSULE BY MOUTH TWICE A DAY 180 capsule 1  . fenofibrate (TRICOR) 145 MG tablet Take 1 tablet (145 mg total) daily by mouth. 90 tablet 1  . furosemide (LASIX) 20 MG tablet One tablet dailyu as needed for leg swelling 30 tablet 0  . loratadine (CLARITIN) 10 MG tablet Take 1 tablet (10 mg total) by mouth daily as needed for allergies. 30 tablet 6  . losartan (COZAAR) 50 MG tablet TAKE 1 TABLET(50 MG) BY MOUTH  DAILY 90 tablet 1  . metoprolol succinate (TOPROL-XL) 25 MG 24 hr tablet Take 1 tablet (25 mg total) daily by mouth. 90 tablet 3  . Multiple Vitamin (MULTIVITAMIN) tablet Take 1 tablet by mouth daily.    . nitroGLYCERIN (NITROSTAT) 0.4 MG SL tablet PLACE 1 TABLET UNDER THE TONGUE EVERY 5 MINUTES AS NEEDED FOR CHEST PAIN 25 tablet 3  . Omega-3 Fatty Acids (FISH OIL PO) Take 1 capsule by mouth daily.    Marland Kitchen oxyCODONE-acetaminophen (PERCOCET) 10-325 MG tablet TK 1 T PO  Q 6 H PRF CHRONIC PAIN  0  . ranitidine (ZANTAC) 150 MG tablet TAKE 1 TABLET BY MOUTH TWICE DAILY 180 tablet 2  . rosuvastatin (CRESTOR) 40 MG tablet TAKE 1 TABLET BY MOUTH EVERY DAY 90 tablet 0   No current facility-administered medications for this visit.    Allergies:  Gabapentin; Meperidine hcl; Niacin; and Propoxyphene n-acetaminophen   Social History: The patient  reports that she has been smoking cigarettes.  She has a 12.50 pack-year smoking history. She has never used smokeless tobacco. She reports that she does not drink alcohol or use drugs.   ROS:  Please see the history of present illness. Otherwise, complete review of systems is positive for arthritic pain and stiffness.  All other systems are reviewed and negative.   Physical Exam: VS:  BP (!) 174/80 (BP Location: Right Arm)   Pulse 77   Ht 5\' 5"  (1.651 m)   Wt 188 lb (85.3 kg)   SpO2 97%   BMI 31.28 kg/m , BMI Body mass index is 31.28 kg/m.  Wt Readings from Last 3 Encounters:  05/16/18 188 lb (85.3 kg)  05/05/18 186 lb (84.4 kg)  04/01/18 187 lb (84.8 kg)    General: Patient appears comfortable at rest. HEENT: Conjunctiva and lids normal, oropharynx clear. Neck: Supple, no elevated JVP or carotid bruits, no thyromegaly. Lungs: Clear to auscultation, nonlabored breathing at rest. Cardiac: Regular rate and rhythm, no S3 or significant systolic murmur, no pericardial rub. Abdomen: Soft, nontender, bowel sounds present. Extremities: Trace ankle edema,  distal pulses 2+. Skin: Warm and dry. Musculoskeletal: No kyphosis. Neuropsychiatric: Alert and oriented x3, affect grossly appropriate.  ECG: I personally reviewed the tracing from 02/07/2018 which showed sinus bradycardia with PAC, increased voltage, decreased R wave progression.  Recent Labwork: 01/25/2018: TSH 0.92 02/04/2018: Hemoglobin 11.6; Platelets 195 04/01/2018: ALT 11; AST 21; BUN 34; Creat 1.37; Potassium 4.6; Sodium 137     Component Value Date/Time   CHOL 110 04/01/2018 0905   TRIG 269 (H) 04/01/2018 0905   HDL 27 (L) 04/01/2018 0905   CHOLHDL 4.1 04/01/2018 0905   VLDL 37 (H) 04/22/2017 0952   LDLCALC 51 04/01/2018 0905    Other Studies Reviewed Today:  Echocardiogram 11/01/2013:  Study Conclusions  Left ventricle: The cavity size was normal. Wall thickness was normal. Systolic function was normal. The estimated ejection fraction was in the range of 55% to 60%. Wall motion was normal; there were no regional wall motion abnormalities. There was an increased relative contribution of atrial contraction to ventricular filling.  Assessment and Plan:  1.  Multivessel CAD status post CABG in 2014.  She is reporting vague exertional symptoms, possibly angina.  We will continue with her current medical regimen and proceed with a Lexiscan Myoview to assess ischemic burden.  Anticipate further medical therapy adjustments, possibly addition of long-acting nitrate.  2.  Mixed hyperlipidemia.  She continues on Crestor.  Recent LDL 51.  3.  Essential hypertension, blood pressure trend has been up.  Cozaar recently added by Dr. Moshe Cipro.  4.  CKD stage 3 by history, recent creatinine 1.37.  Current medicines were reviewed with the patient today.   Orders Placed This Encounter  Procedures  . NM Myocar Multi W/Spect W/Wall Motion / EF    Disposition: Call with test results and next step.  Otherwise follow-up in 6 months.  Signed, Satira Sark, MD, [facility] 05/16/2018  9:02 AM    [place] at [facility] [address], [place], [place] [address] Phone: [phone]; Fax: [phone]

## 2018-05-16 ENCOUNTER — Encounter: Payer: Self-pay | Admitting: Cardiology

## 2018-05-16 ENCOUNTER — Ambulatory Visit (INDEPENDENT_AMBULATORY_CARE_PROVIDER_SITE_OTHER): Payer: Medicare HMO | Admitting: Cardiology

## 2018-05-16 VITALS — BP 174/80 | HR 77 | Ht 65.0 in | Wt 188.0 lb

## 2018-05-16 DIAGNOSIS — N183 Chronic kidney disease, stage 3 unspecified: Secondary | ICD-10-CM

## 2018-05-16 DIAGNOSIS — I25119 Atherosclerotic heart disease of native coronary artery with unspecified angina pectoris: Secondary | ICD-10-CM

## 2018-05-16 DIAGNOSIS — I10 Essential (primary) hypertension: Secondary | ICD-10-CM

## 2018-05-16 DIAGNOSIS — E782 Mixed hyperlipidemia: Secondary | ICD-10-CM

## 2018-05-16 NOTE — Patient Instructions (Addendum)
Your physician wants you to follow-up in: 6 months  with Dr.McDowell You will receive a reminder letter in the mail two months in advance. If you don't receive a letter, please call our office to schedule the follow-up appointment.   Your physician has requested that you have a lexiscan myoview. For further information please visit [email or web address]. Please follow instruction sheet, as given.     Your physician recommends that you continue on your current medications as directed. Please refer to the Current Medication list given to you today.    If you need a refill on your cardiac medications before your next appointment, please call your pharmacy.    No lab work ordered today     Thank you for choosing [facility] !

## 2018-05-20 ENCOUNTER — Other Ambulatory Visit: Payer: Self-pay | Admitting: Family Medicine

## 2018-05-24 ENCOUNTER — Other Ambulatory Visit: Payer: Self-pay | Admitting: Family Medicine

## 2018-05-24 ENCOUNTER — Telehealth: Payer: Self-pay | Admitting: Family Medicine

## 2018-05-24 NOTE — Telephone Encounter (Signed)
Patient left voicemail requesting a callback to discuss a denial of a recent medication request. I called her back, no answer, lvm to see what medication she is talking about Cb# [phone]/ [phone]

## 2018-05-24 NOTE — Telephone Encounter (Signed)
Pt aware it was sent back to [facility]

## 2018-06-03 DIAGNOSIS — M5030 Other cervical disc degeneration, unspecified cervical region: Secondary | ICD-10-CM | POA: Diagnosis not present

## 2018-06-03 DIAGNOSIS — M19012 Primary osteoarthritis, left shoulder: Secondary | ICD-10-CM | POA: Diagnosis not present

## 2018-06-03 DIAGNOSIS — M47816 Spondylosis without myelopathy or radiculopathy, lumbar region: Secondary | ICD-10-CM | POA: Diagnosis not present

## 2018-06-03 DIAGNOSIS — Z79899 Other long term (current) drug therapy: Secondary | ICD-10-CM | POA: Diagnosis not present

## 2018-06-03 DIAGNOSIS — M5137 Other intervertebral disc degeneration, lumbosacral region: Secondary | ICD-10-CM | POA: Diagnosis not present

## 2018-06-12 ENCOUNTER — Other Ambulatory Visit: Payer: Self-pay | Admitting: Family Medicine

## 2018-06-21 ENCOUNTER — Other Ambulatory Visit: Payer: Self-pay | Admitting: Family Medicine

## 2018-06-22 ENCOUNTER — Telehealth: Payer: Self-pay | Admitting: Family Medicine

## 2018-06-22 ENCOUNTER — Other Ambulatory Visit: Payer: Self-pay | Admitting: Family Medicine

## 2018-06-22 ENCOUNTER — Telehealth: Payer: Self-pay

## 2018-06-22 MED ORDER — CLONAZEPAM 1 MG PO TABS: ORAL_TABLET | ORAL | 4 refills | Status: DC

## 2018-06-22 NOTE — Progress Notes (Signed)
Klonopin 

## 2018-06-22 NOTE — Telephone Encounter (Signed)
[facility] - Left Msg 

## 2018-06-22 NOTE — Telephone Encounter (Signed)
[facility] [address] in [place]... Pt needs her  Klonopin

## 2018-06-23 ENCOUNTER — Other Ambulatory Visit: Payer: Self-pay

## 2018-06-23 ENCOUNTER — Ambulatory Visit: Payer: Self-pay | Admitting: Family Medicine

## 2018-06-23 MED ORDER — CLONAZEPAM 1 MG PO TABS: ORAL_TABLET | ORAL | 3 refills | Status: DC

## 2018-06-23 NOTE — Telephone Encounter (Signed)
Patient is requesting her klonopin to be re routed to [facility] in [place]

## 2018-06-23 NOTE — Telephone Encounter (Signed)
Waiting for dr simpson to sign and will fax to [facility]

## 2018-06-29 ENCOUNTER — Ambulatory Visit: Payer: Self-pay | Admitting: Family Medicine

## 2018-07-01 ENCOUNTER — Other Ambulatory Visit: Payer: Self-pay | Admitting: Family Medicine

## 2018-07-05 ENCOUNTER — Ambulatory Visit: Payer: Medicare HMO | Admitting: Family Medicine

## 2018-07-05 ENCOUNTER — Telehealth: Payer: Self-pay | Admitting: Family Medicine

## 2018-07-05 ENCOUNTER — Telehealth: Payer: Self-pay

## 2018-07-05 DIAGNOSIS — F418 Other specified anxiety disorders: Secondary | ICD-10-CM

## 2018-07-05 NOTE — Telephone Encounter (Signed)
New Message   *STAT* If patient is at the pharmacy, call can be transferred to refill team.   1. Which medications need to be refilled? (please list name of each medication and dose if known)  Levothyroxine Sodium Tab 100 MCG once daily  2. Which pharmacy/location (including street and city if local pharmacy) is medication to be sent to? [facility] [phone] - [place], [address] AT [place]  3. Do they need a 30 day or 90 day supply?  30 days  Pt verbalized if we can send 50 MCG instead of 100 MCG would be great if not the 100 MCG is okay.

## 2018-07-05 NOTE — BH Specialist Note (Signed)
[facility] Telephone Follow-up  MRN: [id number] NAME: Tammy Suarez Date: 07/05/18   Total time: 30 minutes Call number: 3/6  Reason for call today: Reason for Contact: PHQ9-8 weeks  PHQ-9 Scores:  Depression screen [facility] 2/9 07/05/2018 05/05/2018 04/01/2018 09/22/2017 07/05/2017  Decreased Interest 2 3 2  - 1  Down, Depressed, Hopeless 3 3 2 3 3   PHQ - 2 Score 5 6 4 3 4   Altered sleeping 2 3 1  0 0  Tired, decreased energy 2 2 1 2 1   Change in appetite 1 1 1  0 0  Feeling bad or failure about yourself  3 1 1  0 0  Trouble concentrating 1 3 0 2 0  Moving slowly or fidgety/restless 1 2 0 3 0  Suicidal thoughts 0 2 0 1 1  PHQ-9 Score 15 20 8 11 6   Difficult doing work/chores Very difficult Very difficult - - Somewhat difficult  Some recent data might be hidden    GAD-7 Scores:  GAD 7 : Generalized Anxiety Score 07/05/2018 05/05/2018  Nervous, Anxious, on Edge 2 3  Control/stop worrying 2 3  Worry too much - different things 2 3  Trouble relaxing 2 3  Restless 1 3  Easily annoyed or irritable 1 0  Afraid - awful might happen 2 3  Total GAD 7 Score 12 18  Anxiety Difficulty Somewhat difficult -    Stress Current stressors: Current Stressors: (Daughter and grandaughter are in jail) Sleep: Sleep: No problems Appetite: Appetite: No problems Coping ability: Coping ability: Exhausted, Overwhelmed  Patient taking medications as prescribed: Patient taking medications as prescribed: Yes  Current medications:  Outpatient Encounter Medications as of 07/05/2018  Medication Sig  . aspirin EC 81 MG tablet Take 81 mg by mouth daily.  . Aspirin-Acetaminophen-Caffeine (GOODY HEADACHE PO) Take 1 Package by mouth 2 (two) times daily as needed (headache).  . clonazePAM (KLONOPIN) 1 MG tablet Take one tablet by mouth three times daily for anxiety  . DULoxetine (CYMBALTA) 60 MG capsule TAKE ONE CAPSULE BY MOUTH TWICE A DAY  . fenofibrate (TRICOR) 145 MG tablet TAKE 1 TABLET BY MOUTH  EVERY DAY  . furosemide (LASIX) 20 MG tablet One tablet dailyu as needed for leg swelling  . levothyroxine (SYNTHROID, LEVOTHROID) 100 MCG tablet TAKE 1 TABLET BY MOUTH EVERY DAY  . loratadine (CLARITIN) 10 MG tablet Take 1 tablet (10 mg total) by mouth daily as needed for allergies.  Marland Kitchen losartan (COZAAR) 50 MG tablet TAKE 1 TABLET(50 MG) BY MOUTH DAILY  . metoprolol succinate (TOPROL-XL) 25 MG 24 hr tablet Take 1 tablet (25 mg total) daily by mouth.  . Multiple Vitamin (MULTIVITAMIN) tablet Take 1 tablet by mouth daily.  . nitroGLYCERIN (NITROSTAT) 0.4 MG SL tablet PLACE 1 TABLET UNDER THE TONGUE EVERY 5 MINUTES AS NEEDED FOR CHEST PAIN  . Omega-3 Fatty Acids (FISH OIL PO) Take 1 capsule by mouth daily.  Marland Kitchen oxyCODONE-acetaminophen (PERCOCET) 10-325 MG tablet TK 1 T PO  Q 6 H PRF CHRONIC PAIN  . ranitidine (ZANTAC) 150 MG tablet TAKE 1 TABLET BY MOUTH TWICE DAILY  . rosuvastatin (CRESTOR) 40 MG tablet TAKE 1 TABLET BY MOUTH EVERY DAY   No facility-administered encounter medications on file as of 07/05/2018.      Self-harm Behaviors Risk Assessment Self-harm risk factors: Self-harm risk factors: (None Reported) Patient endorses recent thoughts of harming self: Have you recently had any thoughts about harming yourself?: No  [place] Suicide Severity Rating Scale: No flowsheet data  found. C-SRSS 05/06/2018 25-Jul-2018 07/25/18  1. Wish to be Dead No No No  2. Suicidal Thoughts No No No  6. Suicide Behavior Question No No No     Danger to Others Risk Assessment Danger to others risk factors: Danger to Others Risk Factors: No risk factors noted Patient endorses recent thoughts of harming others: Notification required: No need or identified person    Substance Use Assessment Patient recently consumed alcohol: Have you recently consumed alcohol?: No  Alcohol Use Disorder Identification Test (AUDIT):  Alcohol Use Disorder Test (AUDIT) 05/06/2018 2018-07-25  1. How often do you have a drink  containing alcohol? 0 0  2. How many drinks containing alcohol do you have on a typical day when you are drinking? 0 0  3. How often do you have six or more drinks on one occasion? 0 0  AUDIT-C Score 0 0  Intervention/Follow-up AUDIT Score <7 follow-up not indicated AUDIT Score <7 follow-up not indicated   Patient recently used drugs: Have you recently used any drugs?: No    Goals, Interventions and Follow-up Plan Goals: Increase healthy adjustment to current life circumstances Interventions: Behavioral Activation and Supportive Counseling Follow-up Plan: [facility] Phone Follow Up   Summary:   Follow Up - Patient reports that she stopped taking Prozac because she did not like the side effects.  Patient reports that she has been taking her Cymbalta.  Patient reports that the psychiatric medication is helping with her depression.   Patient reports that she has been worried about her adult daughter and her 59 year old granddaughter are both in prison in [place] and [place].   Patient denies SI/HI/Psychosis/Substance Abuse. If your symptoms worsen or you have thoughts of suicide/homicide, PLEASE SEEK IMMEDIATE MEDICAL ATTENTION.  You may always call:  National Suicide Hotline: 203-197-2508; [place] Crisis Line: 223-215-9193; Crisis Recovery in [place]: (231)306-9793.  These are available 24 hours a day, 7 days a week.  Patient reports that she likes to garden in her back yard.  During the next session patient will begin to mow the lawn as a means of controlling her depression    [name], LCAS-A

## 2018-07-06 ENCOUNTER — Other Ambulatory Visit: Payer: Self-pay

## 2018-07-06 MED ORDER — LEVOTHYROXINE SODIUM 100 MCG PO TABS: 100.0000 ug | ORAL_TABLET | Freq: Every day | ORAL | 5 refills | Status: DC

## 2018-07-06 NOTE — Progress Notes (Signed)
Improvement in PHQ9, GAD 7. No change in medication recommendation.

## 2018-07-06 NOTE — Telephone Encounter (Signed)
Refill sent.

## 2018-07-08 ENCOUNTER — Other Ambulatory Visit: Payer: Self-pay | Admitting: Family Medicine

## 2018-07-08 DIAGNOSIS — M5137 Other intervertebral disc degeneration, lumbosacral region: Secondary | ICD-10-CM | POA: Diagnosis not present

## 2018-07-08 DIAGNOSIS — M47816 Spondylosis without myelopathy or radiculopathy, lumbar region: Secondary | ICD-10-CM | POA: Diagnosis not present

## 2018-07-08 DIAGNOSIS — M19012 Primary osteoarthritis, left shoulder: Secondary | ICD-10-CM | POA: Diagnosis not present

## 2018-07-08 DIAGNOSIS — M5030 Other cervical disc degeneration, unspecified cervical region: Secondary | ICD-10-CM | POA: Diagnosis not present

## 2018-07-08 DIAGNOSIS — Z79899 Other long term (current) drug therapy: Secondary | ICD-10-CM | POA: Diagnosis not present

## 2018-07-21 ENCOUNTER — Encounter: Payer: Self-pay | Admitting: Cardiovascular Disease

## 2018-07-26 ENCOUNTER — Ambulatory Visit (INDEPENDENT_AMBULATORY_CARE_PROVIDER_SITE_OTHER): Payer: Medicare HMO | Admitting: Family Medicine

## 2018-07-26 ENCOUNTER — Encounter: Payer: Self-pay | Admitting: Family Medicine

## 2018-07-26 ENCOUNTER — Other Ambulatory Visit: Payer: Self-pay | Admitting: Family Medicine

## 2018-07-26 VITALS — BP 148/78 | HR 91 | Resp 97 | Ht 60.0 in | Wt 190.0 lb

## 2018-07-26 DIAGNOSIS — F17218 Nicotine dependence, cigarettes, with other nicotine-induced disorders: Secondary | ICD-10-CM | POA: Diagnosis not present

## 2018-07-26 DIAGNOSIS — Z23 Encounter for immunization: Secondary | ICD-10-CM

## 2018-07-26 DIAGNOSIS — I10 Essential (primary) hypertension: Secondary | ICD-10-CM | POA: Diagnosis not present

## 2018-07-26 DIAGNOSIS — E038 Other specified hypothyroidism: Secondary | ICD-10-CM

## 2018-07-26 DIAGNOSIS — F418 Other specified anxiety disorders: Secondary | ICD-10-CM

## 2018-07-26 DIAGNOSIS — E1121 Type 2 diabetes mellitus with diabetic nephropathy: Secondary | ICD-10-CM | POA: Diagnosis not present

## 2018-07-26 DIAGNOSIS — E785 Hyperlipidemia, unspecified: Secondary | ICD-10-CM | POA: Diagnosis not present

## 2018-07-26 NOTE — Patient Instructions (Addendum)
F/U in 4 months, call if you need me before  Please continue to work on stopping smoking  entirely  Please get your heart test done  Labst today, cBc, cmp an EGFR, HBA1C, TSH   Flu vaccine today    Please get your mammogram  [facility] yur energy improves, we need to find out the problem

## 2018-07-27 ENCOUNTER — Telehealth: Payer: Self-pay

## 2018-07-27 DIAGNOSIS — F418 Other specified anxiety disorders: Secondary | ICD-10-CM

## 2018-07-27 LAB — COMPLETE METABOLIC PANEL WITH GFR
AG Ratio: 1.4 (calc) (ref 1.0–2.5)
ALBUMIN MSPROF: 3.9 g/dL (ref 3.6–5.1)
ALKALINE PHOSPHATASE (APISO): 53 U/L (ref 33–130)
ALT: 11 U/L (ref 6–29)
AST: 24 U/L (ref 10–35)
BUN / CREAT RATIO: 21 (calc) (ref 6–22)
BUN: 28 mg/dL — ABNORMAL HIGH (ref 7–25)
CO2: 28 mmol/L (ref 20–32)
CREATININE: 1.36 mg/dL — AB (ref 0.60–0.93)
Calcium: 10.2 mg/dL (ref 8.6–10.4)
Chloride: 100 mmol/L (ref 98–110)
GFR, EST NON AFRICAN AMERICAN: 39 mL/min/{1.73_m2} — AB (ref >=60)
GFR, Est African American: 45 mL/min/{1.73_m2} — ABNORMAL LOW (ref >=60)
GLOBULIN: 2.8 g/dL (ref 1.9–3.7)
Glucose, Bld: 129 mg/dL (ref 65–139)
Potassium: 5.5 mmol/L — ABNORMAL HIGH (ref 3.5–5.3)
SODIUM: 137 mmol/L (ref 135–146)
Total Bilirubin: 0.5 mg/dL (ref 0.2–1.2)
Total Protein: 6.7 g/dL (ref 6.1–8.1)

## 2018-07-27 LAB — TSH: TSH: 1.57 mIU/L (ref 0.40–4.50)

## 2018-07-27 LAB — CBC
HEMATOCRIT: 36.1 % (ref 35.0–45.0)
HEMOGLOBIN: 12.2 g/dL (ref 11.7–15.5)
MCH: 31.9 pg (ref 27.0–33.0)
MCHC: 33.8 g/dL (ref 32.0–36.0)
MCV: 94.3 fL (ref 80.0–100.0)
MPV: 11.4 fL (ref 7.5–12.5)
Platelets: 235 10*3/uL (ref 140–400)
RBC: 3.83 10*6/uL (ref 3.80–5.10)
RDW: 12 % (ref 11.0–15.0)
WBC: 5.4 10*3/uL (ref 3.8–10.8)

## 2018-07-27 LAB — HEMOGLOBIN A1C
EAG (MMOL/L): 8.5 (calc)
HEMOGLOBIN A1C: 7 %{Hb} — AB (ref <5.7)
MEAN PLASMA GLUCOSE: 154 (calc)

## 2018-07-27 NOTE — BH Specialist Note (Signed)
[place] Telephone Follow-up  MRN: [id number] NAME: Tammy Suarez Date: 07/27/18   Total time: 30 minutes Call number: 3/6  Reason for call today:  [facility] Phone Follow Up   PHQ-9 Scores:  Depression screen [facility] 2/9 07/27/2018 07/26/2018 07/05/2018 05/05/2018 04/01/2018  Decreased Interest 1 1 2 3 2   Down, Depressed, Hopeless 1 1 3 3 2   PHQ - 2 Score 2 2 5 6 4   Altered sleeping 1 2 2 3 1   Tired, decreased energy 1 3 2 2 1   Change in appetite 0 2 1 1 1   Feeling bad or failure about yourself  1 1 3 1 1   Trouble concentrating 0 1 1 3  0  Moving slowly or fidgety/restless 0 1 1 2  0  Suicidal thoughts 0 0 0 2 0  PHQ-9 Score 5 12 15 20 8   Difficult doing work/chores Not difficult at all Somewhat difficult Very difficult Very difficult -  Some recent data might be hidden     GAD-7 Scores:  GAD 7 : Generalized Anxiety Score 07/27/2018 07/05/2018 05/05/2018  Nervous, Anxious, on Edge 1 2 3   Control/stop worrying 1 2 3   Worry too much - different things 0 2 3  Trouble relaxing 1 2 3   Restless 0 1 3  Easily annoyed or irritable 1 1 0  Afraid - awful might happen 0 2 3  Total GAD 7 Score 4 12 18   Anxiety Difficulty Not difficult at all Somewhat difficult -    Stress Current stressors: Current Stressors: (Her daughter and grand daughter are in jail for a year due to drug charges. ) Sleep: Sleep: Decreased, Difficulty falling asleep, Difficulty staying asleep Appetite: Appetite: No problems Coping ability: Coping ability: Overwhelmed  Patient taking medications as prescribed: Patient taking medications as prescribed: Yes  Current medications:  Outpatient Encounter Medications as of 07/27/2018  Medication Sig  . amitriptyline (ELAVIL) 25 MG tablet TAKE 1 TABLET BY MOUTH EVERY NIGHT AT BEDTIME  . aspirin EC 81 MG tablet Take 81 mg by mouth daily.  . Aspirin-Acetaminophen-Caffeine (GOODY HEADACHE PO) Take 1 Package by mouth 2 (two) times daily as needed (headache).  .  clonazePAM (KLONOPIN) 1 MG tablet Take one tablet by mouth three times daily for anxiety  . DULoxetine (CYMBALTA) 60 MG capsule TAKE ONE CAPSULE BY MOUTH TWICE A DAY  . fenofibrate (TRICOR) 145 MG tablet TAKE 1 TABLET BY MOUTH EVERY DAY  . furosemide (LASIX) 20 MG tablet One tablet dailyu as needed for leg swelling  . levothyroxine (SYNTHROID, LEVOTHROID) 100 MCG tablet TAKE 1 TABLET(100 MCG) BY MOUTH DAILY  . loratadine (CLARITIN) 10 MG tablet Take 1 tablet (10 mg total) by mouth daily as needed for allergies.  Marland Kitchen losartan (COZAAR) 50 MG tablet TAKE 1 TABLET(50 MG) BY MOUTH DAILY  . metoprolol succinate (TOPROL-XL) 25 MG 24 hr tablet Take 1 tablet (25 mg total) daily by mouth.  . Multiple Vitamin (MULTIVITAMIN) tablet Take 1 tablet by mouth daily.  . nitroGLYCERIN (NITROSTAT) 0.4 MG SL tablet PLACE 1 TABLET UNDER THE TONGUE EVERY 5 MINUTES AS NEEDED FOR CHEST PAIN  . Omega-3 Fatty Acids (FISH OIL PO) Take 1 capsule by mouth daily.  Marland Kitchen oxyCODONE-acetaminophen (PERCOCET) 10-325 MG tablet TK 1 T PO  Q 6 H PRF CHRONIC PAIN  . ranitidine (ZANTAC) 150 MG tablet TAKE 1 TABLET BY MOUTH TWICE DAILY  . rosuvastatin (CRESTOR) 40 MG tablet TAKE 1 TABLET BY MOUTH EVERY DAY   No facility-administered encounter medications  on file as of August 23, 2018.      Self-harm Behaviors Risk Assessment Self-harm risk factors: Self-harm risk factors: (None Reported) Patient endorses recent thoughts of harming self: Have you recently had any thoughts about harming yourself?: No  [place] Suicide Severity Rating Scale: No flowsheet data found. C-SRSS 05/06/2018 07/05/2018 07/05/2018 2018/08/23  1. Wish to be Dead No No No No  2. Suicidal Thoughts No No No No  6. Suicide Behavior Question No No No No     Danger to Others Risk Assessment Danger to others risk factors: Danger to Others Risk Factors: No risk factors noted Patient endorses recent thoughts of harming others: Notification required: No need or identified  person    Substance Use Assessment Patient recently consumed alcohol:  None Reported  Alcohol Use Disorder Identification Test (AUDIT):  Alcohol Use Disorder Test (AUDIT) 05/06/2018 07/05/2018 Aug 23, 2018  1. How often do you have a drink containing alcohol? 0 0 0  2. How many drinks containing alcohol do you have on a typical day when you are drinking? 0 0 0  3. How often do you have six or more drinks on one occasion? 0 0 0  AUDIT-C Score 0 0 0  Intervention/Follow-up AUDIT Score <7 follow-up not indicated AUDIT Score <7 follow-up not indicated AUDIT Score <7 follow-up not indicated   Patient recently used drugs:  None Reported   Goals, Interventions and Follow-up Plan Goals: Increase healthy adjustment to current life circumstances Interventions: Behavioral Activation, Supportive Counseling and Sleep Hygiene Follow-up Plan: [facility] Phone Follow Up   Summary:   Follow up   Patient has a decrease in PHQ and GAD scores.  Patient reports improved mood.  Patient has not had any problems with her son in law since going to court for the restraining order.   Patient reports decreased sleep and an inability to stay asleep.  Patient reports compliance with taking her psychiatric medication.  Patient has stopped taking her prozac one and a half month ago.    Patient reports that her daughter and grand daughter will remain in jail for a year due to their drug charges.  Writer discussed different ways in which she can get out of the house and meet other people.  Patient acknowledged the importance of being able to interact with others and not spending all of her time alone. Writer discussed various sleep hygiene techniques.   During the next session patient will make efforts to find a local AL-NON support group.       Tammy Suarez, LCAS-A

## 2018-07-29 ENCOUNTER — Encounter: Payer: Self-pay | Admitting: Family Medicine

## 2018-07-29 NOTE — Assessment & Plan Note (Signed)
Controlled, no change in medication  

## 2018-07-29 NOTE — Assessment & Plan Note (Signed)
Asked: confirms smokes 10/day Asses; wants to quit but  Not yet ready, wil be cutting back Advise: has CAD and lung disease, also needs to reduce cancer risk Assist: counseled for 5 mins , literature and 1800 QUITNOW # provided Arrange : f/u in 4 months

## 2018-07-29 NOTE — Assessment & Plan Note (Signed)
Not at goal but improved. DASH diet and commitment to daily physical activity for a minimum of 30 minutes discussed and encouraged, as a part of hypertension management. The importance of attaining a healthy weight is also discussed.  BP/Weight 07/26/2018 05/16/2018 05/05/2018 04/01/2018 02/04/2018 01/25/2018 34/94/9447  Systolic BP 395 844 171 278 718 367 255  Diastolic BP 78 80 80 80 73 82 78  Wt. (Lbs) 190 188 186 187 190 189 -  BMI 37.11 31.28 30.95 31.12 31.62 31.45 -

## 2018-07-29 NOTE — Assessment & Plan Note (Signed)
Deteriorated. Patient re-educated about  the importance of commitment to a  minimum of 150 minutes of exercise per week.  The importance of healthy food choices with portion control discussed. Encouraged to start a food diary, count calories and to consider  joining a support group. Sample diet sheets offered. Goals set by the patient for the next several months.   Weight /BMI 07/26/2018 05/16/2018 05/05/2018  WEIGHT 190 lb 188 lb 186 lb  HEIGHT 5\' 0"  5\' 5"  5\' 5"   BMI 37.11 kg/m2 31.28 kg/m2 30.95 kg/m2

## 2018-07-29 NOTE — Progress Notes (Signed)
Tammy Suarez     MRN: [id number]      DOB: 21-Oct-1946   HPI Tammy Suarez is here for follow up and re-evaluation of chronic medical conditions, medication management and review of any available recent lab and radiology data.  Preventive health is updated, specifically  Cancer screening and Immunization.   Currently followed at pain clinic. The PT denies any adverse reactions to current medications since the last visit.  There are no new concerns.  There are no specific complaints   ROS Denies recent fever or chills.c/o fatigue Denies sinus pressure, nasal congestion, ear pain or sore throat. Denies chest congestion, productive cough or wheezing. Denies chest pains, palpitations and leg swelling Denies abdominal pain, nausea, vomiting,diarrhea or constipation.   Denies dysuria, frequency, hesitancy or incontinence. C/o chronic  joint pain, swelling and limitation in mobility. Denies headaches, seizures, numbness, or tingling. C/o  depression, anxiety denies  insomnia. Denies skin break down or rash.   PE  BP (!) 148/78   Pulse 91   Resp (!) 97   Ht 5' (1.524 m)   Wt 190 lb (86.2 kg)   HC 16" (40.6 cm)   SpO2 97%   BMI 37.11 kg/m   Patient alert and oriented and in no cardiopulmonary distress.  HEENT: No facial asymmetry, EOMI,   oropharynx pink and moist.  Neck supple no JVD, no mass.  Chest: Clear to auscultation bilaterally.Decreased air entry   CVS: S1, S2 no murmurs, no S3.Regular rate.  ABD: Soft non tender.   Ext: No edema  MS: Adequate though reduced  ROM spine, shoulders, hips and knees.  Skin: Intact, no ulcerations or rash noted.  Psych: Good eye contact, normal affect. Memory intact not anxious or depressed appearing.  CNS: CN 2-12 intact, power,  normal throughout.no focal deficits noted.   Assessment & Plan  Essential hypertension, benign Not at goal but improved. DASH diet and commitment to daily physical activity for a minimum of 30  minutes discussed and encouraged, as a part of hypertension management. The importance of attaining a healthy weight is also discussed.  BP/Weight 07/26/2018 05/16/2018 05/05/2018 04/01/2018 02/04/2018 01/25/2018 87/56/4332  Systolic BP 951 884 166 063 016 010 932  Diastolic BP 78 80 80 80 73 82 78  Wt. (Lbs) 190 188 186 187 190 189 -  BMI 37.11 31.28 30.95 31.12 31.62 31.45 -       Hyperlipidemia with target LDL less than 70 Hyperlipidemia:Low fat diet discussed and encouraged.   Lipid Panel  Lab Results  Component Value Date   CHOL 110 04/01/2018   HDL 27 (L) 04/01/2018   LDLCALC 51 04/01/2018   TRIG 269 (H) 04/01/2018   CHOLHDL 4.1 04/01/2018   Updated lab needed at/ before next visit. uncontrolled    Hypothyroidism Controlled, no change in medication   Morbid obesity Deteriorated. Patient re-educated about  the importance of commitment to a  minimum of 150 minutes of exercise per week.  The importance of healthy food choices with portion control discussed. Encouraged to start a food diary, count calories and to consider  joining a support group. Sample diet sheets offered. Goals set by the patient for the next several months.   Weight /BMI 07/26/2018 05/16/2018 05/05/2018  WEIGHT 190 lb 188 lb 186 lb  HEIGHT 5\' 0"  5\' 5"  5\' 5"   BMI 37.11 kg/m2 31.28 kg/m2 30.95 kg/m2      Nicotine dependence Asked: confirms smokes 10/day Asses; wants to quit but  Not yet  ready, wil be cutting back Advise: has CAD and lung disease, also needs to reduce cancer risk Assist: counseled for 5 mins , literature and 1800 QUITNOW # provided Arrange : f/u in 4 months  Depression with anxiety Adequately controlled, no change in meds

## 2018-07-29 NOTE — Assessment & Plan Note (Signed)
Adequately controlled, no change in meds

## 2018-07-29 NOTE — Assessment & Plan Note (Signed)
Hyperlipidemia:Low fat diet discussed and encouraged.   Lipid Panel  Lab Results  Component Value Date   CHOL 110 04/01/2018   HDL 27 (L) 04/01/2018   LDLCALC 51 04/01/2018   TRIG 269 (H) 04/01/2018   CHOLHDL 4.1 04/01/2018   Updated lab needed at/ before next visit. uncontrolled

## 2018-07-29 NOTE — Assessment & Plan Note (Signed)
After obtaining informed consent, the vaccine is  administered by LPN.  

## 2018-08-02 DIAGNOSIS — Z23 Encounter for immunization: Secondary | ICD-10-CM | POA: Diagnosis not present

## 2018-08-03 ENCOUNTER — Other Ambulatory Visit: Payer: Self-pay | Admitting: Family Medicine

## 2018-08-05 DIAGNOSIS — G894 Chronic pain syndrome: Secondary | ICD-10-CM | POA: Diagnosis not present

## 2018-08-05 DIAGNOSIS — M7541 Impingement syndrome of right shoulder: Secondary | ICD-10-CM | POA: Diagnosis not present

## 2018-08-05 DIAGNOSIS — M5137 Other intervertebral disc degeneration, lumbosacral region: Secondary | ICD-10-CM | POA: Diagnosis not present

## 2018-08-05 DIAGNOSIS — Z79899 Other long term (current) drug therapy: Secondary | ICD-10-CM | POA: Diagnosis not present

## 2018-08-05 DIAGNOSIS — M129 Arthropathy, unspecified: Secondary | ICD-10-CM | POA: Diagnosis not present

## 2018-08-16 ENCOUNTER — Telehealth: Payer: Self-pay

## 2018-08-16 NOTE — Telephone Encounter (Signed)
2nd attempt - [facility]   

## 2018-08-23 ENCOUNTER — Telehealth: Payer: Self-pay

## 2018-08-23 DIAGNOSIS — F418 Other specified anxiety disorders: Secondary | ICD-10-CM

## 2018-08-25 ENCOUNTER — Other Ambulatory Visit: Payer: Self-pay | Admitting: Family Medicine

## 2018-08-30 ENCOUNTER — Encounter (HOSPITAL_BASED_OUTPATIENT_CLINIC_OR_DEPARTMENT_OTHER)
Admission: RE | Admit: 2018-08-30 | Discharge: 2018-08-30 | Disposition: A | Discharge status: Home / Self Care | Payer: Medicare HMO | Source: Ambulatory Visit | Attending: Cardiology | Admitting: Cardiology

## 2018-08-30 ENCOUNTER — Encounter (HOSPITAL_COMMUNITY)
Admission: RE | Admit: 2018-08-30 | Discharge: 2018-08-30 | Disposition: A | Discharge status: Home / Self Care | Payer: Medicare HMO | Source: Ambulatory Visit | Attending: Cardiology | Admitting: Cardiology

## 2018-08-30 ENCOUNTER — Encounter (HOSPITAL_COMMUNITY): Payer: Self-pay

## 2018-08-30 DIAGNOSIS — I25119 Atherosclerotic heart disease of native coronary artery with unspecified angina pectoris: Secondary | ICD-10-CM | POA: Diagnosis not present

## 2018-08-30 HISTORY — DX: Type 2 diabetes mellitus without complications: E11.9

## 2018-08-30 HISTORY — DX: Malignant (primary) neoplasm, unspecified: C80.1

## 2018-08-30 LAB — NM MYOCAR MULTI W/SPECT W/WALL MOTION / EF
CHL CUP NUCLEAR SSS: 0
CSEPPHR: 65 {beats}/min
LHR: 0.28
LV sys vol: 20 mL
LVDIAVOL: 74 mL (ref 46–106)
Rest HR: 48 {beats}/min
SDS: 0
SRS: 0
TID: 1.08

## 2018-08-30 MED ORDER — TECHNETIUM TC 99M TETROFOSMIN IV KIT
10.0000 | PACK | Freq: Once | INTRAVENOUS | Status: AC | PRN
  Administered 2018-08-30: 10.1 via INTRAVENOUS

## 2018-08-30 MED ORDER — SODIUM CHLORIDE 0.9% FLUSH
INTRAVENOUS | Status: AC
Start: 2018-08-30 — End: 2018-08-30
  Administered 2018-08-30: 10 mL via INTRAVENOUS
  Filled 2018-08-30: qty 10

## 2018-08-30 MED ORDER — REGADENOSON 0.4 MG/5ML IV SOLN
INTRAVENOUS | Status: AC
  Administered 2018-08-30: 0.4 mg via INTRAVENOUS
  Filled 2018-08-30: qty 5

## 2018-08-30 MED ORDER — TECHNETIUM TC 99M TETROFOSMIN IV KIT
30.0000 | PACK | Freq: Once | INTRAVENOUS | Status: AC | PRN
  Administered 2018-08-30: 32 via INTRAVENOUS

## 2018-08-31 ENCOUNTER — Telehealth: Payer: Self-pay

## 2018-08-31 NOTE — Telephone Encounter (Signed)
Called pt. No answer, left message for pt. To return call.

## 2018-08-31 NOTE — Telephone Encounter (Signed)
-----   Message from Satira Sark, MD sent at 08/31/2018  8:07 AM EDT ----- Results reviewed.  Please let her know that the stress test was low risk and did not show any large ischemic areas to suggest progressive CAD.  We could consider a trial of Imdur 30 mg once in the evening to see if this helps with any of her symptoms.  Otherwise keep regular follow-up. A copy of this test should be forwarded to Fayrene Helper, MD.

## 2018-09-01 ENCOUNTER — Telehealth: Payer: Self-pay

## 2018-09-01 MED ORDER — ISOSORBIDE MONONITRATE ER 30 MG PO TB24: 30.0000 mg | ORAL_TABLET | Freq: Every day | ORAL | 3 refills | Status: DC

## 2018-09-01 NOTE — Telephone Encounter (Signed)
Relayed stress test results to pt, e-scribed Imdur to local pharmacy

## 2018-09-01 NOTE — Telephone Encounter (Signed)
-----   Message from Satira Sark, MD sent at 08/31/2018  8:07 AM EDT ----- Results reviewed.  Please let her know that the stress test was low risk and did not show any large ischemic areas to suggest progressive CAD.  We could consider a trial of Imdur 30 mg once in the evening to see if this helps with any of her symptoms.  Otherwise keep regular follow-up. A copy of this test should be forwarded to Fayrene Helper, MD.

## 2018-09-02 DIAGNOSIS — G894 Chronic pain syndrome: Secondary | ICD-10-CM | POA: Diagnosis not present

## 2018-09-02 DIAGNOSIS — G5601 Carpal tunnel syndrome, right upper limb: Secondary | ICD-10-CM | POA: Diagnosis not present

## 2018-09-02 DIAGNOSIS — Z79899 Other long term (current) drug therapy: Secondary | ICD-10-CM | POA: Diagnosis not present

## 2018-09-13 ENCOUNTER — Telehealth: Payer: Self-pay

## 2018-09-13 DIAGNOSIS — F418 Other specified anxiety disorders: Secondary | ICD-10-CM

## 2018-09-13 NOTE — BH Specialist Note (Signed)
[facility] Telephone Follow-up  MRN: [id number] NAME: Tammy Suarez Date: 09/13/18   Total time: 30 minutes Call number: 4/6  Reason for call today: Reason for Contact: PHQ9-Discharge  PHQ-9 Scores:  Depression screen [facility] 2/9 09/13/2018 08/23/2018 07/27/2018 07/26/2018 07/05/2018  Decreased Interest 0 1 1 1 2   Down, Depressed, Hopeless 0 1 1 1 3   PHQ - 2 Score 0 2 2 2 5   Altered sleeping 0 1 1 2 2   Tired, decreased energy 0 1 1 3 2   Change in appetite 0 0 0 2 1  Feeling bad or failure about yourself  0 1 1 1 3   Trouble concentrating 0 0 0 1 1  Moving slowly or fidgety/restless 0 0 0 1 1  Suicidal thoughts 0 0 0 0 0  PHQ-9 Score 0 5 5 12 15   Difficult doing work/chores Not difficult at all Somewhat difficult Not difficult at all Somewhat difficult Very difficult  Some recent data might be hidden     GAD-7 Scores:  GAD 7 : Generalized Anxiety Score 09/13/2018 08/29/2018 07/27/2018 07/05/2018  Nervous, Anxious, on Edge 0 1 1 2   Control/stop worrying 0 0 1 2  Worry too much - different things 0 1 0 2  Trouble relaxing 0 1 1 2   Restless 0 0 0 1  Easily annoyed or irritable 0 0 1 1  Afraid - awful might happen 0 1 0 2  Total GAD 7 Score 0 4 4 12   Anxiety Difficulty - - Not difficult at all Somewhat difficult    Stress Current stressors: Current Stressors: (None Reported) Sleep: Sleep: No problems Appetite: Appetite: No problems Coping ability: Coping ability: Normal  Patient taking medications as prescribed: Patient taking medications as prescribed: No prescribed medications  Current medications:  Outpatient Encounter Medications as of 09/13/2018  Medication Sig  . amitriptyline (ELAVIL) 25 MG tablet TAKE 1 TABLET BY MOUTH EVERY NIGHT AT BEDTIME  . aspirin EC 81 MG tablet Take 81 mg by mouth daily.  . Aspirin-Acetaminophen-Caffeine (GOODY HEADACHE PO) Take 1 Package by mouth 2 (two) times daily as needed (headache).  . clonazePAM (KLONOPIN) 1 MG tablet Take one  tablet by mouth three times daily for anxiety  . DULoxetine (CYMBALTA) 60 MG capsule TAKE 1 CAPSULE BY MOUTH TWICE DAILY  . fenofibrate (TRICOR) 145 MG tablet TAKE 1 TABLET BY MOUTH EVERY DAY  . furosemide (LASIX) 20 MG tablet TAKE 1 TABLET BY MOUTH DAILY AS NEEDED FOR LEG SWELLING  . isosorbide mononitrate (IMDUR) 30 MG 24 hr tablet Take 1 tablet (30 mg total) by mouth at bedtime.  Marland Kitchen levothyroxine (SYNTHROID, LEVOTHROID) 100 MCG tablet TAKE 1 TABLET(100 MCG) BY MOUTH DAILY  . loratadine (CLARITIN) 10 MG tablet Take 1 tablet (10 mg total) by mouth daily as needed for allergies.  Marland Kitchen losartan (COZAAR) 50 MG tablet TAKE 1 TABLET(50 MG) BY MOUTH DAILY  . metoprolol succinate (TOPROL-XL) 25 MG 24 hr tablet TAKE 1 TABLET BY MOUTH EVERY DAY  . Multiple Vitamin (MULTIVITAMIN) tablet Take 1 tablet by mouth daily.  . nitroGLYCERIN (NITROSTAT) 0.4 MG SL tablet PLACE 1 TABLET UNDER THE TONGUE EVERY 5 MINUTES AS NEEDED FOR CHEST PAIN  . Omega-3 Fatty Acids (FISH OIL PO) Take 1 capsule by mouth daily.  Marland Kitchen oxyCODONE-acetaminophen (PERCOCET) 10-325 MG tablet TK 1 T PO  Q 6 H PRF CHRONIC PAIN  . ranitidine (ZANTAC) 150 MG tablet TAKE 1 TABLET BY MOUTH TWICE DAILY  . rosuvastatin (CRESTOR) 40 MG  tablet TAKE 1 TABLET BY MOUTH EVERY DAY   No facility-administered encounter medications on file as of 10-01-18.      Self-harm Behaviors Risk Assessment Self-harm risk factors: Self-harm risk factors: (None Reported) Patient endorses recent thoughts of harming self: Have you recently had any thoughts about harming yourself?: No  [place] Suicide Severity Rating Scale: No flowsheet data found. C-SRSS 05/06/2018 07/05/2018 07/05/2018 07/27/2018 2018-10-01  1. Wish to be Dead No No No No No  2. Suicidal Thoughts No No No No No  6. Suicide Behavior Question No No No No No     Danger to Others Risk Assessment Danger to others risk factors: Danger to Others Risk Factors: No risk factors noted Patient endorses recent  thoughts of harming others: Notification required: No need or identified person    Substance Use Assessment Patient recently consumed alcohol:  No  Alcohol Use Disorder Identification Test (AUDIT):  Alcohol Use Disorder Test (AUDIT) 05/06/2018 07/05/2018 07/27/2018 2018-10-01  1. How often do you have a drink containing alcohol? 0 0 0 0  2. How many drinks containing alcohol do you have on a typical day when you are drinking? 0 0 0 0  3. How often do you have six or more drinks on one occasion? 0 0 0 0  AUDIT-C Score 0 0 0 0  Intervention/Follow-up AUDIT Score <7 follow-up not indicated AUDIT Score <7 follow-up not indicated AUDIT Score <7 follow-up not indicated AUDIT Score <7 follow-up not indicated   Patient recently used drugs:  No    Goals, Interventions and Follow-up Plan Goals: Increase healthy adjustment to current life circumstances Interventions: Motivational Interviewing and Supportive Counseling Follow-up Plan: [facility] Discharge   Summary:   Patient is a 25=year old female. Patient discussed relapse prevention coping mechanisms.  Patient reports that her mood has improved.  Patient PHQ and GAD score has remained decreased.    Patient denies SI/HI/Psychosis/Substance Abuse. If your symptoms worsen or you have thoughts of suicide/homicide, PLEASE SEEK IMMEDIATE MEDICAL ATTENTION.  You may always call:  National Suicide Hotline: (701) 662-1312;  [place] Crisis Line: 479-505-9042;  [facility] in [place]: (860)716-4855.  These are available 24 hours a day, 7 days a week.  Writer will forward this information to the PCP and Dr. Modesta Messing.        Patient is a 72 year old female.  Patient   Tammy Suarez, LCAS-A

## 2018-09-14 NOTE — Progress Notes (Signed)
Patient denies significant mood symptoms, and PHQ 9/GAD7 scores remain low. Would recommend continuing current dose of antidepressant for at least 6-9 months to avoid relapse. These medication may be tapered off in the future if the patient has had less than two mood episodes in the past. Would limit clonazepam use given its risk of fall, oversedation.   [facility] will sign off. Please contact us if any questions or concerns.

## 2018-09-16 ENCOUNTER — Other Ambulatory Visit: Payer: Self-pay | Admitting: Family Medicine

## 2018-09-20 ENCOUNTER — Other Ambulatory Visit: Payer: Self-pay | Admitting: Family Medicine

## 2018-09-20 MED ORDER — CLONAZEPAM 1 MG PO TABS: ORAL_TABLET | ORAL | 2 refills | Status: DC

## 2018-10-03 DIAGNOSIS — M19012 Primary osteoarthritis, left shoulder: Secondary | ICD-10-CM | POA: Diagnosis not present

## 2018-10-03 DIAGNOSIS — M25512 Pain in left shoulder: Secondary | ICD-10-CM | POA: Diagnosis not present

## 2018-10-03 DIAGNOSIS — G8929 Other chronic pain: Secondary | ICD-10-CM | POA: Diagnosis not present

## 2018-10-03 DIAGNOSIS — M25511 Pain in right shoulder: Secondary | ICD-10-CM | POA: Diagnosis not present

## 2018-10-03 DIAGNOSIS — Z79899 Other long term (current) drug therapy: Secondary | ICD-10-CM | POA: Diagnosis not present

## 2018-10-22 ENCOUNTER — Other Ambulatory Visit: Payer: Self-pay | Admitting: Family Medicine

## 2018-10-24 ENCOUNTER — Other Ambulatory Visit: Payer: Self-pay | Admitting: Family Medicine

## 2018-11-04 DIAGNOSIS — Z79899 Other long term (current) drug therapy: Secondary | ICD-10-CM | POA: Diagnosis not present

## 2018-11-04 DIAGNOSIS — M7541 Impingement syndrome of right shoulder: Secondary | ICD-10-CM | POA: Diagnosis not present

## 2018-11-04 DIAGNOSIS — G894 Chronic pain syndrome: Secondary | ICD-10-CM | POA: Diagnosis not present

## 2018-11-07 ENCOUNTER — Other Ambulatory Visit: Payer: Self-pay | Admitting: Nurse Practitioner

## 2018-11-07 DIAGNOSIS — M7541 Impingement syndrome of right shoulder: Secondary | ICD-10-CM

## 2018-11-11 ENCOUNTER — Other Ambulatory Visit: Payer: Self-pay | Admitting: Family Medicine

## 2018-11-29 ENCOUNTER — Ambulatory Visit: Payer: Medicare HMO | Admitting: Family Medicine

## 2018-11-29 ENCOUNTER — Other Ambulatory Visit: Payer: Self-pay

## 2018-12-05 DIAGNOSIS — G894 Chronic pain syndrome: Secondary | ICD-10-CM | POA: Diagnosis not present

## 2018-12-05 DIAGNOSIS — M7541 Impingement syndrome of right shoulder: Secondary | ICD-10-CM | POA: Diagnosis not present

## 2018-12-05 DIAGNOSIS — Z79899 Other long term (current) drug therapy: Secondary | ICD-10-CM | POA: Diagnosis not present

## 2018-12-06 ENCOUNTER — Ambulatory Visit
Admission: RE | Admit: 2018-12-06 | Discharge: 2018-12-06 | Disposition: A | Discharge status: Home / Self Care | Payer: Medicare HMO | Source: Ambulatory Visit | Attending: Nurse Practitioner | Admitting: Nurse Practitioner

## 2018-12-06 DIAGNOSIS — M25511 Pain in right shoulder: Secondary | ICD-10-CM | POA: Diagnosis not present

## 2018-12-06 DIAGNOSIS — M7541 Impingement syndrome of right shoulder: Secondary | ICD-10-CM

## 2018-12-16 ENCOUNTER — Other Ambulatory Visit: Payer: Self-pay | Admitting: Family Medicine

## 2018-12-20 ENCOUNTER — Other Ambulatory Visit: Payer: Self-pay | Admitting: Family Medicine

## 2018-12-21 ENCOUNTER — Telehealth: Payer: Self-pay | Admitting: *Deleted

## 2018-12-21 NOTE — Telephone Encounter (Signed)
Patient no showed for her January appt. Last seen in Sept

## 2018-12-21 NOTE — Telephone Encounter (Signed)
Pt call needing a refill on clonazepam. This can be sent to [facility] in [place]

## 2018-12-22 ENCOUNTER — Ambulatory Visit (INDEPENDENT_AMBULATORY_CARE_PROVIDER_SITE_OTHER): Payer: Medicare HMO | Admitting: Family Medicine

## 2018-12-22 ENCOUNTER — Encounter: Payer: Self-pay | Admitting: Family Medicine

## 2018-12-22 VITALS — BP 160/80 | HR 65 | Resp 15 | Ht 60.0 in | Wt 202.0 lb

## 2018-12-22 DIAGNOSIS — Z1211 Encounter for screening for malignant neoplasm of colon: Secondary | ICD-10-CM

## 2018-12-22 DIAGNOSIS — F17218 Nicotine dependence, cigarettes, with other nicotine-induced disorders: Secondary | ICD-10-CM | POA: Diagnosis not present

## 2018-12-22 DIAGNOSIS — E038 Other specified hypothyroidism: Secondary | ICD-10-CM

## 2018-12-22 DIAGNOSIS — E1121 Type 2 diabetes mellitus with diabetic nephropathy: Secondary | ICD-10-CM

## 2018-12-22 DIAGNOSIS — R936 Abnormal findings on diagnostic imaging of limbs: Secondary | ICD-10-CM | POA: Diagnosis not present

## 2018-12-22 DIAGNOSIS — M25511 Pain in right shoulder: Secondary | ICD-10-CM

## 2018-12-22 DIAGNOSIS — I10 Essential (primary) hypertension: Secondary | ICD-10-CM

## 2018-12-22 DIAGNOSIS — Z1239 Encounter for other screening for malignant neoplasm of breast: Secondary | ICD-10-CM | POA: Diagnosis not present

## 2018-12-22 MED ORDER — CLONAZEPAM 1 MG PO TABS: ORAL_TABLET | ORAL | 3 refills | Status: DC

## 2018-12-22 NOTE — Patient Instructions (Addendum)
HBA1C, cmp and EGFR, HBA!C and TSH today, if possible  F/U in 4 months, call if you need me before  Medication is being sent  Please schedule mammogram at checkoout I will refer you for Ortho consultaion for a May visit with and [facility] , we will get in touch re right shoulder and will message Ms. Donovan Kail  Colonoscopy will be done

## 2018-12-23 ENCOUNTER — Encounter: Payer: Self-pay | Admitting: Family Medicine

## 2018-12-23 LAB — HEMOGLOBIN A1C
HEMOGLOBIN A1C: 7.8 %{Hb} — AB (ref <5.7)
Mean Plasma Glucose: 177 (calc)
eAG (mmol/L): 9.8 (calc)

## 2018-12-23 LAB — COMPLETE METABOLIC PANEL WITH GFR
AG RATIO: 1.5 (calc) (ref 1.0–2.5)
ALT: 9 U/L (ref 6–29)
AST: 22 U/L (ref 10–35)
Albumin: 4 g/dL (ref 3.6–5.1)
Alkaline phosphatase (APISO): 50 U/L (ref 37–153)
BUN / CREAT RATIO: 18 (calc) (ref 6–22)
BUN: 24 mg/dL (ref 7–25)
CO2: 26 mmol/L (ref 20–32)
Calcium: 10.3 mg/dL (ref 8.6–10.4)
Chloride: 102 mmol/L (ref 98–110)
Creat: 1.35 mg/dL — ABNORMAL HIGH (ref 0.60–0.93)
GFR, Est African American: 45 mL/min/{1.73_m2} — ABNORMAL LOW (ref >=60)
GFR, Est Non African American: 39 mL/min/{1.73_m2} — ABNORMAL LOW (ref >=60)
Globulin: 2.6 g/dL (calc) (ref 1.9–3.7)
Glucose, Bld: 153 mg/dL — ABNORMAL HIGH (ref 65–139)
Potassium: 4.7 mmol/L (ref 3.5–5.3)
SODIUM: 138 mmol/L (ref 135–146)
Total Bilirubin: 0.5 mg/dL (ref 0.2–1.2)
Total Protein: 6.6 g/dL (ref 6.1–8.1)

## 2018-12-23 LAB — TSH: TSH: 1.19 mIU/L (ref 0.40–4.50)

## 2018-12-23 MED ORDER — GLIPIZIDE ER 2.5 MG PO TB24: 2.5000 mg | ORAL_TABLET | Freq: Every day | ORAL | 5 refills | Status: DC

## 2018-12-23 MED ORDER — HYDROCHLOROTHIAZIDE 12.5 MG PO CAPS: 12.5000 mg | ORAL_CAPSULE | Freq: Every day | ORAL | 4 refills | Status: DC

## 2018-12-23 NOTE — Assessment & Plan Note (Signed)
Uncontrolled at visit, generally not  well controlled, will increase medication DASH diet and commitment to daily physical activity for a minimum of 30 minutes discussed and encouraged, as a part of hypertension management. The importance of attaining a healthy weight is also discussed.  BP/Weight 12/22/2018 07/26/2018 05/16/2018 05/05/2018 04/01/2018 02/04/2018 07/10/1897  Systolic BP 421 031 281 188 677 373 668  Diastolic BP 80 78 80 80 80 73 82  Wt. (Lbs) 202 190 188 186 187 190 189  BMI 39.45 37.11 31.28 30.95 31.12 31.62 31.45

## 2018-12-23 NOTE — Assessment & Plan Note (Signed)
Deteriorated, dietary change needed and start glipizide ER 2.5 mg daily Tammy Suarez is reminded of the importance of commitment to daily physical activity for 30 minutes or more, as able and the need to limit carbohydrate intake to 30 to 60 grams per meal to help with blood sugar control.   The need to take medication as prescribed, test blood sugar as directed, and to call between visits if there is a concern that blood sugar is uncontrolled is also discussed.   Tammy Suarez is reminded of the importance of daily foot exam, annual eye examination, and good blood sugar, blood pressure and cholesterol control.  Diabetic Labs Latest Ref Rng & Units 12/22/2018 07/26/2018 04/01/2018 02/04/2018 10/29/2017  HbA1c <5.7 % of total Hgb 7.8(H) 7.0(H) 6.9(H) - 6.7(H)  Microalbumin mg/dL - - - - 13.3  Micro/Creat Ratio <30 mcg/mg creat - - - - 132(H)  Chol <200 mg/dL - - 110 - 129  HDL >50 mg/dL - - 27(L) - 23(L)  Calc LDL mg/dL (calc) - - 51 - 75  Triglycerides <150 mg/dL - - 269(H) - 216(H)  Creatinine 0.60 - 0.93 mg/dL 1.35(H) 1.36(H) 1.37(H) 1.44(H) 1.27(H)   BP/Weight 12/22/2018 07/26/2018 05/16/2018 05/05/2018 04/01/2018 02/04/2018 0/96/0454  Systolic BP 098 119 147 829 562 130 865  Diastolic BP 80 78 80 80 80 73 82  Wt. (Lbs) 202 190 188 186 187 190 189  BMI 39.45 37.11 31.28 30.95 31.12 31.62 31.45   Foot/eye exam completion dates Latest Ref Rng & Units 05/05/2018 01/26/2018  Eye Exam No Retinopathy - No Retinopathy  Foot exam Order - - -  Foot Form Completion - Done -

## 2018-12-23 NOTE — Progress Notes (Signed)
Tammy Suarez     MRN: [id number]      DOB: 08-18-1946   HPI Tammy Suarez is here for follow up and re-evaluation of chronic medical conditions, medication management and review of any available recent lab and radiology data.  Preventive health is updated, specifically  Cancer screening and Immunization.   Questions or concerns regarding consultations or procedures which the PT has had in the interim are  Addressed.Being treated at the pain clinic succesfully The PT denies any adverse reactions to current medications since the last visit.  C/o markedly reduced ROM right shoulder , recent MRI shows significant arthropathy as well as muscle   ROS Denies recent fever or chills. Denies sinus pressure, nasal congestion, ear pain or sore throat. Denies chest congestion, productive cough or wheezing. Denies chest pains, palpitations and leg swelling Denies abdominal pain, nausea, vomiting,diarrhea or constipation.   Denies dysuria, frequency, hesitancy or incontinence. Denies headaches, seizures, numbness, or tingling. C/o  depression, anxiety and  Insomnia.Dealing with life the best she is able to Denies skin break down or rash.   PE  BP (!) 160/80   Pulse 65   Resp 15   Ht 5' (1.524 m)   Wt 202 lb (91.6 kg)   SpO2 97%   BMI 39.45 kg/m   Patient alert and oriented and in no cardiopulmonary distress.  HEENT: No facial asymmetry, EOMI,   oropharynx pink and moist.  Neck supple no JVD, no mass.  Chest: Clear to auscultation bilaterally.  CVS: S1, S2 no murmurs, no S3.Regular rate.  ABD: Soft non tender.   Ext: No edema  MS: Decreased ROM spine, , hips and knees.Markedly reduced OM right shoulder  Skin: Intact, no ulcerations or rash noted.  Psych: Good eye contact, normal affect. Memory intact not anxious or depressed appearing.  CNS: CN 2-12 intact, power,  normal throughout.no focal deficits noted.   Assessment & Plan  Essential hypertension, benign Uncontrolled  at visit, generally not  well controlled, will increase medication DASH diet and commitment to daily physical activity for a minimum of 30 minutes discussed and encouraged, as a part of hypertension management. The importance of attaining a healthy weight is also discussed.  BP/Weight 12/22/2018 07/26/2018 05/16/2018 05/05/2018 04/01/2018 02/04/2018 04/22/3015  Systolic BP 010 932 355 732 202 542 706  Diastolic BP 80 78 80 80 80 73 82  Wt. (Lbs) 202 190 188 186 187 190 189  BMI 39.45 37.11 31.28 30.95 31.12 31.62 31.45       DM (diabetes mellitus), type 2 with renal complications (HCC) Deteriorated, dietary change needed and start glipizide ER 2.5 mg daily Tammy Suarez is reminded of the importance of commitment to daily physical activity for 30 minutes or more, as able and the need to limit carbohydrate intake to 30 to 60 grams per meal to help with blood sugar control.   The need to take medication as prescribed, test blood sugar as directed, and to call between visits if there is a concern that blood sugar is uncontrolled is also discussed.   Tammy Suarez is reminded of the importance of daily foot exam, annual eye examination, and good blood sugar, blood pressure and cholesterol control.  Diabetic Labs Latest Ref Rng & Units 12/22/2018 07/26/2018 04/01/2018 02/04/2018 10/29/2017  HbA1c <5.7 % of total Hgb 7.8(H) 7.0(H) 6.9(H) - 6.7(H)  Microalbumin mg/dL - - - - 13.3  Micro/Creat Ratio <30 mcg/mg creat - - - - 132(H)  Chol <200 mg/dL - -  110 - 129  HDL >50 mg/dL - - 27(L) - 23(L)  Calc LDL mg/dL (calc) - - 51 - 75  Triglycerides <150 mg/dL - - 269(H) - 216(H)  Creatinine 0.60 - 0.93 mg/dL 1.35(H) 1.36(H) 1.37(H) 1.44(H) 1.27(H)   BP/Weight 12/22/2018 07/26/2018 05/16/2018 05/05/2018 04/01/2018 02/04/2018 8/33/5825  Systolic BP 189 842 103 128 118 867 737  Diastolic BP 80 78 80 80 80 73 82  Wt. (Lbs) 202 190 188 186 187 190 189  BMI 39.45 37.11 31.28 30.95 31.12 31.62 31.45   Foot/eye exam  completion dates Latest Ref Rng & Units 05/05/2018 01/26/2018  Eye Exam No Retinopathy - No Retinopathy  Foot exam Order - - -  Foot Form Completion - Done -        Nicotine dependence Asked:confirms currently smokes cigarettes Assess: Unwilling to quit but cutting back Advise: needs to QUIT to reduce risk of cancer, cardio and cerebrovascular disease Assist: counseled for 5 minutes and literature provided Arrange: follow up in 3 months   Morbid obesity Deteriorated. Patient re-educated about  the importance of commitment to a  minimum of 150 minutes of exercise per week.  The importance of healthy food choices with portion control discussed. Encouraged to start a food diary, count calories and to consider  joining a support group. Sample diet sheets offered. Goals set by the patient for the next several months.   Weight /BMI 12/22/2018 07/26/2018 05/16/2018  WEIGHT 202 lb 190 lb 188 lb  HEIGHT 5\' 0"  5\' 0"  5\' 5"   BMI 39.45 kg/m2 37.11 kg/m2 31.28 kg/m2      Shoulder pain, right Marked deterioration in symptoms, recent MRI shows marked disease, will refer to Ortho for eval and management , not currently desirous of surgery, has no ine to care her, but wants consultaion  Hypothyroidism Controlled, no change in medication

## 2018-12-23 NOTE — Assessment & Plan Note (Signed)
Asked:confirms currently smokes cigarettes Assess: Unwilling to quit but cutting back Advise: needs to QUIT to reduce risk of cancer, cardio and cerebrovascular disease Assist: counseled for 5 minutes and literature provided Arrange: follow up in 3 months  

## 2018-12-23 NOTE — Assessment & Plan Note (Signed)
Controlled, no change in medication  

## 2018-12-23 NOTE — Assessment & Plan Note (Signed)
Marked deterioration in symptoms, recent MRI shows marked disease, will refer to Ortho for eval and management , not currently desirous of surgery, has no ine to care her, but wants consultaion

## 2018-12-23 NOTE — Assessment & Plan Note (Signed)
Deteriorated. Patient re-educated about  the importance of commitment to a  minimum of 150 minutes of exercise per week.  The importance of healthy food choices with portion control discussed. Encouraged to start a food diary, count calories and to consider  joining a support group. Sample diet sheets offered. Goals set by the patient for the next several months.   Weight /BMI 12/22/2018 07/26/2018 05/16/2018  WEIGHT 202 lb 190 lb 188 lb  HEIGHT 5\' 0"  5\' 0"  5\' 5"   BMI 39.45 kg/m2 37.11 kg/m2 31.28 kg/m2

## 2018-12-27 ENCOUNTER — Other Ambulatory Visit: Payer: Self-pay

## 2018-12-27 ENCOUNTER — Other Ambulatory Visit: Payer: Self-pay | Admitting: Family Medicine

## 2018-12-27 DIAGNOSIS — E1121 Type 2 diabetes mellitus with diabetic nephropathy: Secondary | ICD-10-CM

## 2018-12-27 MED ORDER — BLOOD GLUCOSE METER KIT: PACK | 0 refills | Status: DC

## 2019-01-02 ENCOUNTER — Encounter: Payer: Self-pay | Admitting: Internal Medicine

## 2019-01-05 DIAGNOSIS — G894 Chronic pain syndrome: Secondary | ICD-10-CM | POA: Diagnosis not present

## 2019-01-05 DIAGNOSIS — M5030 Other cervical disc degeneration, unspecified cervical region: Secondary | ICD-10-CM | POA: Diagnosis not present

## 2019-01-05 DIAGNOSIS — F1120 Opioid dependence, uncomplicated: Secondary | ICD-10-CM | POA: Diagnosis not present

## 2019-01-05 DIAGNOSIS — Z79899 Other long term (current) drug therapy: Secondary | ICD-10-CM | POA: Diagnosis not present

## 2019-01-05 DIAGNOSIS — M19012 Primary osteoarthritis, left shoulder: Secondary | ICD-10-CM | POA: Diagnosis not present

## 2019-01-19 ENCOUNTER — Other Ambulatory Visit: Payer: Self-pay | Admitting: Family Medicine

## 2019-01-22 ENCOUNTER — Other Ambulatory Visit: Payer: Self-pay | Admitting: Family Medicine

## 2019-02-01 ENCOUNTER — Other Ambulatory Visit: Payer: Self-pay | Admitting: Family Medicine

## 2019-02-02 DIAGNOSIS — G894 Chronic pain syndrome: Secondary | ICD-10-CM | POA: Diagnosis not present

## 2019-02-02 DIAGNOSIS — M5030 Other cervical disc degeneration, unspecified cervical region: Secondary | ICD-10-CM | POA: Diagnosis not present

## 2019-02-02 DIAGNOSIS — Z79899 Other long term (current) drug therapy: Secondary | ICD-10-CM | POA: Diagnosis not present

## 2019-02-07 ENCOUNTER — Ambulatory Visit: Payer: Medicare HMO | Admitting: Gastroenterology

## 2019-03-01 ENCOUNTER — Encounter: Payer: Self-pay | Admitting: *Deleted

## 2019-03-06 DIAGNOSIS — M19012 Primary osteoarthritis, left shoulder: Secondary | ICD-10-CM | POA: Diagnosis not present

## 2019-03-06 DIAGNOSIS — G894 Chronic pain syndrome: Secondary | ICD-10-CM | POA: Diagnosis not present

## 2019-03-06 DIAGNOSIS — M7541 Impingement syndrome of right shoulder: Secondary | ICD-10-CM | POA: Diagnosis not present

## 2019-03-06 DIAGNOSIS — Z79899 Other long term (current) drug therapy: Secondary | ICD-10-CM | POA: Diagnosis not present

## 2019-03-13 ENCOUNTER — Other Ambulatory Visit: Payer: Self-pay | Admitting: Cardiology

## 2019-03-16 ENCOUNTER — Other Ambulatory Visit: Payer: Self-pay | Admitting: Family Medicine

## 2019-03-23 ENCOUNTER — Ambulatory Visit: Payer: Medicare HMO | Admitting: Orthopaedic Surgery

## 2019-03-23 ENCOUNTER — Other Ambulatory Visit: Payer: Self-pay

## 2019-03-30 ENCOUNTER — Other Ambulatory Visit: Payer: Self-pay | Admitting: Family Medicine

## 2019-03-31 ENCOUNTER — Ambulatory Visit: Payer: Medicare HMO | Admitting: Gastroenterology

## 2019-04-03 ENCOUNTER — Ambulatory Visit: Payer: Self-pay

## 2019-04-04 ENCOUNTER — Ambulatory Visit: Payer: Self-pay | Admitting: Family Medicine

## 2019-04-05 ENCOUNTER — Encounter: Payer: Self-pay | Admitting: Family Medicine

## 2019-04-05 ENCOUNTER — Ambulatory Visit (INDEPENDENT_AMBULATORY_CARE_PROVIDER_SITE_OTHER): Payer: Medicare HMO | Admitting: Family Medicine

## 2019-04-05 VITALS — BP 120/67 | HR 65 | Ht 60.0 in | Wt 202.0 lb

## 2019-04-05 DIAGNOSIS — R252 Cramp and spasm: Secondary | ICD-10-CM

## 2019-04-05 DIAGNOSIS — Z0000 Encounter for general adult medical examination without abnormal findings: Secondary | ICD-10-CM

## 2019-04-05 DIAGNOSIS — K219 Gastro-esophageal reflux disease without esophagitis: Secondary | ICD-10-CM

## 2019-04-05 MED ORDER — FAMOTIDINE 20 MG PO TABS: 20.0000 mg | ORAL_TABLET | Freq: Two times a day (BID) | ORAL | 0 refills | Status: DC

## 2019-04-05 NOTE — Progress Notes (Signed)
Subjective:   Tammy Suarez is a 73 y.o. female who presents for Medicare Annual (Subsequent) preventive examination.  Location of Patient: Home Location of Provider: Telehealth Consent was obtain for visit to be over via telehealth. I verified that I am speaking with the correct person using two identifiers.  HPI: Tammy Suarez is a 73 year old female patient of Dr. Moshe Cipro.  Who presents today for annual wellness visit via telemedicine.  Overall she is doing good she needs to have her Zantac switched over to Pepcid or something of the like.  Additionally she reports that she is having some mild cramps in her legs.  She is taking spoonful mustard and it helps.  She is unsure of what is causing it.  Denies having any injury or trauma to the site.   Review of Systems:    Cardiac Risk Factors include: advanced age (>73mn, >>51women);obesity (BMI >30kg/m2);hypertension;dyslipidemia     Objective:     Vitals: BP 120/67   Pulse 65   Ht 5' (1.524 m)   Wt 202 lb (91.6 kg)   BMI 39.45 kg/m   Body mass index is 39.45 kg/m.  Advanced Directives 07/05/2017 05/05/2017 07/23/2016 07/17/2016 06/17/2016 03/14/2015 11/02/2013  Does Patient Have a Medical Advance Directive? No No No No No No Patient does not have advance directive  Would patient like information on creating a medical advance directive? Yes (MAU/Ambulatory/Procedural Areas - Information given) No - Patient declined No - patient declined information Yes - EScientist, clinical (histocompatibility and immunogenetics)given - Yes - Educational materials given -  Pre-existing out of facility DNR order (yellow form or pink MOST form) - - - - - - No    Tobacco Social History   Tobacco Use  Smoking Status Current Every Day Smoker  . Packs/day: 0.25  . Years: 50.00  . Pack years: 12.50  . Types: Cigarettes  Smokeless Tobacco Never Used  Tobacco Comment   15     Ready to quit: No Counseling given: Yes Comment: 15   Clinical Intake:  Pre-visit preparation completed:  No  Pain : 0-10 Pain Score: 8  Pain Type: Chronic pain Pain Location: Neck Pain Radiating Towards: arm and hand Pain Descriptors / Indicators: Aching, Sharp Pain Frequency: Constant Pain Relieving Factors: pain medicine Effect of Pain on Daily Activities: sometimes  Pain Relieving Factors: pain medicine  BMI - recorded: 39.45 Nutritional Status: BMI > 30  Obese Nutritional Risks: None Diabetes: No  How often do you need to have someone help you when you read instructions, pamphlets, or other written materials from your doctor or pharmacy?: 1 - Never What is the last grade level you completed in school?: GED  Interpreter Needed?: No     Past Medical History:  Diagnosis Date  . Arthritis   . Cancer (HCC)    Uterine  . Carpal tunnel syndrome    bilaterally  . Chronic back pain   . CKD (chronic kidney disease) stage 3, GFR 30-59 ml/min (HCC)   . Coronary atherosclerosis of native coronary artery    Multivessel status post CABG - LIMA to LAD, SVG to OM and SVG to PDA  . Depression   . Diabetes mellitus without complication ([place]   . Essential hypertension, benign   . GERD (gastroesophageal reflux disease)   . Headache   . History of conjunctivitis   . History of hiatal hernia   . Hyperlipidemia   . Hypothyroidism   . Myocardial infarction ([place]   . Obesity   .  Vitamin B 12 deficiency    Past Surgical History:  Procedure Laterality Date  . ABDOMINAL HYSTERECTOMY    . BACK SURGERY    . CARDIAC SURGERY    . CARPAL TUNNEL RELEASE Left 07/23/2016   Procedure: CARPAL TUNNEL RELEASE;  Surgeon: Carole Civil, MD;  Location: [facility];  Service: Orthopedics;  Laterality: Left;  . CERVICAL LAMINECTOMY    . CHOLECYSTECTOMY    . CORONARY ARTERY BYPASS GRAFT N/A 11/02/2013   Procedure: CORONARY ARTERY BYPASS GRAFTING (CABG);  Surgeon: Gaye Pollack, MD;  Location: [facility];  Service: Open Heart Surgery;  Laterality: N/A;  CABG x three, using left internal mammary artery and  right leg greater saphenous vein harvested endoscopically  . COSMETIC SURGERY  07/01/2012   Recurrent skin infection on lower abdomen, [facility]  . HERNIA REPAIR    . Inguinal herniorrhaphy    . INTRAOPERATIVE TRANSESOPHAGEAL ECHOCARDIOGRAM N/A 11/02/2013   Procedure: INTRAOPERATIVE TRANSESOPHAGEAL ECHOCARDIOGRAM;  Surgeon: Gaye Pollack, MD;  Location: [facility] OR;  Service: Open Heart Surgery;  Laterality: N/A;  . LAPAROSCOPIC GASTRIC BANDING  2009  . LEFT HEART CATHETERIZATION WITH CORONARY ANGIOGRAM N/A 10/30/2013   Procedure: LEFT HEART CATHETERIZATION WITH CORONARY ANGIOGRAM;  Surgeon: Minus Breeding, MD;  Location: [facility] CATH LAB;  Service: Cardiovascular;  Laterality: N/A;  . Left index finger repair    . LUMBAR FUSION    . SPINE SURGERY    . VESICOVAGINAL FISTULA CLOSURE W/ TAH     Family History  Problem Relation Age of Onset  . Heart attack Mother   . Heart attack Father   . Hypertension Sister   . Heart attack Sister   . Cancer Brother        Renal cell   . Hepatitis Daughter   . Drug abuse Daughter   . Hypertension Daughter    Social History   Socioeconomic History  . Marital status: Single    Spouse name: Not on file  . Number of children: 3  . Years of education: Not on file  . Highest education level: GED or equivalent  Occupational History  . Not on file  Social Needs  . Financial resource strain: Very hard  . Food insecurity:    Worry: Often true    Inability: Sometimes true  . Transportation needs:    Medical: Yes    Non-medical: Yes  Tobacco Use  . Smoking status: Current Every Day Smoker    Packs/day: 0.25    Years: 50.00    Pack years: 12.50    Types: Cigarettes  . Smokeless tobacco: Never Used  . Tobacco comment: 15  Substance and Sexual Activity  . Alcohol use: No    Alcohol/week: 0.0 standard drinks  . Drug use: No  . Sexual activity: Never    Birth control/protection: Surgical  Lifestyle  . Physical activity:    Days per week: 0 days     Minutes per session: 0 min  . Stress: Very much  Relationships  . Social connections:    Talks on phone: Twice a week    Gets together: More than three times a week    Attends religious service: Not on file    Active member of club or organization: Yes    Attends meetings of clubs or organizations: 1 to 4 times per year    Relationship status: Separated  Other Topics Concern  . Not on file  Social History Narrative  . Not on file    Outpatient Encounter  Medications as of 04/05/2019  Medication Sig  . ACCU-CHEK AVIVA PLUS test strip AS DIRECTED  . ACCU-CHEK FASTCLIX LANCETS MISC AS DIRECTED  . amitriptyline (ELAVIL) 25 MG tablet TAKE 1 TABLET BY MOUTH EVERY NIGHT AT BEDTIME  . aspirin EC 81 MG tablet Take 81 mg by mouth daily.  . Aspirin-Acetaminophen-Caffeine (GOODY HEADACHE PO) Take 1 Package by mouth 2 (two) times daily as needed (headache).  . blood glucose meter kit and supplies Dispense based on patient and insurance preference. Once daily testing dx e11.9  . clonazePAM (KLONOPIN) 1 MG tablet Take one tablet three times daily for anxiety  . DULoxetine (CYMBALTA) 60 MG capsule TAKE 1 CAPSULE BY MOUTH TWICE DAILY  . fenofibrate (TRICOR) 145 MG tablet TAKE 1 TABLET BY MOUTH EVERY DAY  . furosemide (LASIX) 20 MG tablet TAKE 1 TABLET BY MOUTH DAILY AS NEEDED FOR LEG SWELLING  . glipiZIDE (GLUCOTROL XL) 2.5 MG 24 hr tablet Take 1 tablet (2.5 mg total) by mouth daily with breakfast.  . hydrochlorothiazide (MICROZIDE) 12.5 MG capsule Take 1 capsule (12.5 mg total) by mouth daily.  . isosorbide mononitrate (IMDUR) 30 MG 24 hr tablet Take 1 tablet (30 mg total) by mouth at bedtime.  Marland Kitchen levothyroxine (SYNTHROID) 100 MCG tablet TAKE 1 TABLET BY MOUTH EVERY DAY  . loratadine (CLARITIN) 10 MG tablet Take 1 tablet (10 mg total) by mouth daily as needed for allergies.  Marland Kitchen losartan (COZAAR) 50 MG tablet TAKE 1 TABLET(50 MG) BY MOUTH DAILY  . metoprolol succinate (TOPROL-XL) 25 MG 24 hr tablet TAKE  1 TABLET BY MOUTH EVERY DAY  . Multiple Vitamin (MULTIVITAMIN) tablet Take 1 tablet by mouth daily.  . nitroGLYCERIN (NITROSTAT) 0.4 MG SL tablet DISSOLVE 1 TABLET UNDER THE TONGUE EVERY 5 MINUTES AS NEEDED FOR CHEST PAIN  . Omega-3 Fatty Acids (FISH OIL PO) Take 1 capsule by mouth daily.  Marland Kitchen oxyCODONE-acetaminophen (PERCOCET) 10-325 MG tablet TK 1 T PO  Q 6 H PRF CHRONIC PAIN  . ranitidine (ZANTAC) 150 MG tablet TAKE 1 TABLET BY MOUTH TWICE DAILY  . rosuvastatin (CRESTOR) 40 MG tablet TAKE 1 TABLET BY MOUTH EVERY DAY   No facility-administered encounter medications on file as of 04/05/2019.     Activities of Daily Living In your present state of health, do you have any difficulty performing the following activities: 04/05/2019  Hearing? N  Vision? N  Difficulty concentrating or making decisions? N  Walking or climbing stairs? N  Dressing or bathing? N  Doing errands, shopping? N  Preparing Food and eating ? N  Using the Toilet? N  In the past six months, have you accidently leaked urine? Y  Do you have problems with loss of bowel control? N  Managing your Medications? N  Managing your Finances? N  Housekeeping or managing your Housekeeping? N  Some recent data might be hidden    Patient Care Team: Fayrene Helper, MD as PCP - General Domenic Polite Aloha Gell, MD as PCP - Cardiology (Cardiology) Earnie Larsson, MD as Consulting Physician (Neurosurgery) Carole Civil, MD as Consulting Physician (Orthopedic Surgery) Phillips Odor, MD as Consulting Physician (Neurology) Gala Romney Cristopher Estimable, MD as Consulting Physician (Gastroenterology)    Assessment:   This is a routine wellness examination for Tammy Suarez.  Exercise Activities and Dietary recommendations Current Exercise Habits: The patient does not participate in regular exercise at present, Exercise limited by: orthopedic condition(s)  Goals    . Exercise 3x per week (30 min per time)  Recommend starting a routine exercise  program at least 3 days a week for 30-45 minutes at a time as tolerated.       . Quit smoking / using tobacco       Fall Risk Fall Risk  04/05/2019 07/26/2018 05/05/2018 04/01/2018 01/25/2018  Falls in the past year? 0 No Yes Yes Yes  Number falls in past yr: - - 2 or more 2 or more 1  Injury with Fall? 0 - - - -  Risk Factor Category  - - - - -  Risk for fall due to : - - - - -  Follow up - - - - -   Is the patient's home free of loose throw rugs in walkways, pet beds, electrical cords, etc?   yes      Grab bars in the bathroom? yes      Handrails on the stairs?   no stairs      Adequate lighting?   yes  Timed Get Up and Go performed:  Unable to assess due to telemedicine  Depression Screen PHQ 2/9 Scores 04/05/2019 09/13/2018 08/23/2018 07/27/2018  PHQ - 2 Score 2 0 2 2  PHQ- 9 Score 8 0 5 5     Cognitive Function     6CIT Screen 04/05/2019 07/05/2017  What Year? 0 points 0 points  What month? 0 points 0 points  What time? 0 points 0 points  Count back from 20 0 points 0 points  Months in reverse 0 points 0 points  Repeat phrase 4 points 0 points  Total Score 4 0    Immunization History  Administered Date(s) Administered  . Influenza Split 10/20/2011, 08/08/2012  . Influenza Whole 09/23/2009, 09/26/2010  . Influenza,inj,Quad PF,6+ Mos 07/28/2013, 09/03/2014, 10/02/2015, 09/09/2016, 09/27/2017, 08/02/2018  . Pneumococcal Conjugate-13 01/30/2015  . Pneumococcal Polysaccharide-23 01/30/2011  . Td 06/13/2010  . Zoster 12/09/2015    Qualifies for Shingles Vaccine? Completed with Zoster.   Screening Tests Health Maintenance  Topic Date Due  . COLONOSCOPY  02/19/2016  . MAMMOGRAM  11/26/2018  . OPHTHALMOLOGY EXAM  01/27/2019  . FOOT EXAM  05/07/2019  . INFLUENZA VACCINE  06/17/2019  . HEMOGLOBIN A1C  06/22/2019  . TETANUS/TDAP  06/13/2020  . DEXA SCAN  Completed  . Hepatitis C Screening  Completed  . PNA vac Low Risk Adult  Completed    Cancer Screenings:  Lung: Low Dose CT Chest recommended if Age 3-80 years, 30 pack-year currently smoking OR have quit w/in 15years. Patient does qualify. Breast:  Up to date on Mammogram? No-needs to schedule   Up to date of Bone Density/Dexa? Yes  Colorectal: needs to be scheduled   Additional Screenings:   Hepatitis C Screening: completed      Plan:      1. Encounter for Medicare annual wellness exam I have personally reviewed and noted the following in the patient's chart:   . Medical and social history . Use of alcohol, tobacco or illicit drugs  . Current medications and supplements . Functional ability and status . Nutritional status . Physical activity . Advanced directives . List of other physicians . Hospitalizations, surgeries, and ER visits in previous 12 months . Vitals . Screenings to include cognitive, depression, and falls . Referrals and appointments  In addition, I have reviewed and discussed with patient certain preventive protocols, quality metrics, and best practice recommendations. A written personalized care plan for preventive services as well as general preventive health recommendations were provided to patient.  2. Gastroesophageal reflux disease, esophagitis presence not specified Controlled, was on Zantac, DC Zantac secondary to recent findings of this medication not being good in recall.  Prescribed Pepcid to see if this will help as she does suffer from heartburn if she does not take it regularly.  We will follow-up as needed.  Reviewed side effects, risks and benefits of medication.   Patient acknowledged agreement and understanding of the plan.    - famotidine (PEPCID) 20 MG tablet; Take 1 tablet (20 mg total) by mouth 2 (two) times daily.  Dispense: 30 tablet; Refill: 0  3. Cramp in lower leg Reports that she has some lower leg cramping that hasbeen relieved gated with a tablespoon of mustard.  Educated on the need to make sure she is maintaining proper  electrolytes.  She could consume a banana or potato every now and then along with drinking water and making sure she is eating a well-balanced diet.  She gets these with walking and at rest.  Unsure of the true cause versus if it is claudication.  She reports that her leg feels warm to the touch.  Unable to assess pulses due to the telemedicine visit advised that if it does not improve or if it worsens that she is to get an appointment with [place] in the office. Patient acknowledged agreement and understanding of the plan.     I provided 30 minutes of non-face-to-face time during this encounter.  Perlie Mayo, NP  04/05/2019

## 2019-04-05 NOTE — Patient Instructions (Signed)
[name] , Thank you for taking time to come for your Medicare Wellness Visit. I appreciate your ongoing commitment to your health goals. Please review the following plan we discussed and let me know if I can assist you in the future.   I have sent you in Pepcid, in place of your Zantac.  Please take this when as directed.  And stop taking your Zantac.  Please continue to make sure you are eating a well-balanced diet, getting enough electrolytes which can be found in fresh fruits and vegetables.  And maintaining good water intake.  To help with your leg cramping.  If your leg cramping does not subside or you are getting it at rest or it stops as soon as you are done walking please let us know as we would need to see you in the doctor's office for better assessment of this.  Screening recommendations/referrals: Colonoscopy: Needs to reschedule secondary to the virus  mammogram: Needs to reschedule secondary to the virus Bone Density: Completed could use update  recommended yearly ophthalmology/optometry visit for glaucoma screening and checkup Recommended yearly dental visit for hygiene and checkup  Vaccinations: Influenza vaccine: Due fall 2020 Pneumococcal vaccine: Completed Tdap vaccine: Up-to-date till 2021 Shingles vaccine: Has zoster, could possibly get Shingrix if insurance covers it will discuss with Dr. Moshe Cipro if needed.  Advanced directives: Discussed with Dr. Moshe Cipro  Conditions/risks identified: Identified and discussed  Next appointment: 04/20/2019   Preventive Care 73 Years and Older, Female Preventive care refers to lifestyle choices and visits with your health care provider that can promote health and wellness. What does preventive care include?  A yearly physical exam. This is also called an annual well check.  Dental exams once or twice a year.  Routine eye exams. Ask your health care provider how often you should have your eyes checked.  Personal lifestyle  choices, including:  Daily care of your teeth and gums.  Regular physical activity.  Eating a healthy diet.  Avoiding tobacco and drug use.  Limiting alcohol use.  Practicing safe sex.  Taking low-dose aspirin every day.  Taking vitamin and mineral supplements as recommended by your health care provider. What happens during an annual well check? The services and screenings done by your health care provider during your annual well check will depend on your age, overall health, lifestyle risk factors, and family history of disease. Counseling  Your health care provider may ask you questions about your:  Alcohol use.  Tobacco use.  Drug use.  Emotional well-being.  Home and relationship well-being.  Sexual activity.  Eating habits.  History of falls.  Memory and ability to understand (cognition).  Work and work Statistician.  Reproductive health. Screening  You may have the following tests or measurements:  Height, weight, and BMI.  Blood pressure.  Lipid and cholesterol levels. These may be checked every 5 years, or more frequently if you are over 73 years old.  Skin check.  Lung cancer screening. You may have this screening every year starting at age 73 if you have a 30-pack-year history of smoking and currently smoke or have quit within the past 15 years.  Fecal occult blood test (FOBT) of the stool. You may have this test every year starting at age 73  Flexible sigmoidoscopy or colonoscopy. You may have a sigmoidoscopy every 5 years or a colonoscopy every 10 years starting at age 73  Hepatitis C blood test.  Hepatitis B blood test.  Sexually transmitted disease (STD) testing.  Diabetes screening. This is done by checking your blood sugar (glucose) after you have not eaten for a while (fasting). You may have this done every 1-3 years.  Bone density scan. This is done to screen for osteoporosis. You may have this done starting at age  73.  Mammogram. This may be done every 1-2 years. Talk to your health care provider about how often you should have regular mammograms. Talk with your health care provider about your test results, treatment options, and if necessary, the need for more tests. Vaccines  Your health care provider may recommend certain vaccines, such as:  Influenza vaccine. This is recommended every year.  Tetanus, diphtheria, and acellular pertussis (Tdap, Td) vaccine. You may need a Td booster every 10 years.  Zoster vaccine. You may need this after age 73  Pneumococcal 13-valent conjugate (PCV13) vaccine. One dose is recommended after age 73.  Pneumococcal polysaccharide (PPSV23) vaccine. One dose is recommended after age 73. Talk to your health care provider about which screenings and vaccines you need and how often you need them. This information is not intended to replace advice given to you by your health care provider. Make sure you discuss any questions you have with your health care provider. Document Released: 11/29/2015 Document Revised: 07/22/2016 Document Reviewed: 09/03/2015 Elsevier Interactive Patient Education  2017 [place] Prevention in the Home Falls can cause injuries. They can happen to people of all ages. There are many things you can do to make your home safe and to help prevent falls. What can I do on the outside of my home?  Regularly fix the edges of walkways and driveways and fix any cracks.  Remove anything that might make you trip as you walk through a door, such as a raised step or threshold.  Trim any bushes or trees on the path to your home.  Use bright outdoor lighting.  Clear any walking paths of anything that might make someone trip, such as rocks or tools.  Regularly check to see if handrails are loose or broken. Make sure that both sides of any steps have handrails.  Any raised decks and porches should have guardrails on the edges.  Have any  leaves, snow, or ice cleared regularly.  Use sand or salt on walking paths during winter.  Clean up any spills in your garage right away. This includes oil or grease spills. What can I do in the bathroom?  Use night lights.  Install grab bars by the toilet and in the tub and shower. Do not use towel bars as grab bars.  Use non-skid mats or decals in the tub or shower.  If you need to sit down in the shower, use a plastic, non-slip stool.  Keep the floor dry. Clean up any water that spills on the floor as soon as it happens.  Remove soap buildup in the tub or shower regularly.  Attach bath mats securely with double-sided non-slip rug tape.  Do not have throw rugs and other things on the floor that can make you trip. What can I do in the bedroom?  Use night lights.  Make sure that you have a light by your bed that is easy to reach.  Do not use any sheets or blankets that are too big for your bed. They should not hang down onto the floor.  Have a firm chair that has side arms. You can use this for support while you get dressed.  Do not have throw  rugs and other things on the floor that can make you trip. What can I do in the kitchen?  Clean up any spills right away.  Avoid walking on wet floors.  Keep items that you use a lot in easy-to-reach places.  If you need to reach something above you, use a strong step stool that has a grab bar.  Keep electrical cords out of the way.  Do not use floor polish or wax that makes floors slippery. If you must use wax, use non-skid floor wax.  Do not have throw rugs and other things on the floor that can make you trip. What can I do with my stairs?  Do not leave any items on the stairs.  Make sure that there are handrails on both sides of the stairs and use them. Fix handrails that are broken or loose. Make sure that handrails are as long as the stairways.  Check any carpeting to make sure that it is firmly attached to the stairs.  Fix any carpet that is loose or worn.  Avoid having throw rugs at the top or bottom of the stairs. If you do have throw rugs, attach them to the floor with carpet tape.  Make sure that you have a light switch at the top of the stairs and the bottom of the stairs. If you do not have them, ask someone to add them for you. What else can I do to help prevent falls?  Wear shoes that:  Do not have high heels.  Have rubber bottoms.  Are comfortable and fit you well.  Are closed at the toe. Do not wear sandals.  If you use a stepladder:  Make sure that it is fully opened. Do not climb a closed stepladder.  Make sure that both sides of the stepladder are locked into place.  Ask someone to hold it for you, if possible.  Clearly mark and make sure that you can see:  Any grab bars or handrails.  First and last steps.  Where the edge of each step is.  Use tools that help you move around (mobility aids) if they are needed. These include:  Canes.  Walkers.  Scooters.  Crutches.  Turn on the lights when you go into a dark area. Replace any light bulbs as soon as they burn out.  Set up your furniture so you have a clear path. Avoid moving your furniture around.  If any of your floors are uneven, fix them.  If there are any pets around you, be aware of where they are.  Review your medicines with your doctor. Some medicines can make you feel dizzy. This can increase your chance of falling. Ask your doctor what other things that you can do to help prevent falls. This information is not intended to replace advice given to you by your health care provider. Make sure you discuss any questions you have with your health care provider. Document Released: 08/29/2009 Document Revised: 04/09/2016 Document Reviewed: 12/07/2014 Elsevier Interactive Patient Education  2017 [employer].

## 2019-04-19 ENCOUNTER — Other Ambulatory Visit: Payer: Self-pay | Admitting: Family Medicine

## 2019-04-20 ENCOUNTER — Ambulatory Visit: Payer: Medicare HMO | Admitting: Family Medicine

## 2019-04-21 ENCOUNTER — Other Ambulatory Visit: Payer: Self-pay | Admitting: Family Medicine

## 2019-04-21 ENCOUNTER — Telehealth: Payer: Self-pay | Admitting: Family Medicine

## 2019-04-21 MED ORDER — CLONAZEPAM 1 MG PO TABS: ORAL_TABLET | ORAL | 0 refills | Status: DC

## 2019-04-21 NOTE — Telephone Encounter (Signed)
Sent , pls let her know

## 2019-04-21 NOTE — Telephone Encounter (Signed)
[email or web address] has appoinment next week, can this be called in

## 2019-04-21 NOTE — Telephone Encounter (Signed)
Left generic message regarding medication.

## 2019-04-22 ENCOUNTER — Other Ambulatory Visit: Payer: Self-pay | Admitting: Family Medicine

## 2019-04-25 ENCOUNTER — Encounter: Payer: Self-pay | Admitting: Family Medicine

## 2019-04-25 ENCOUNTER — Ambulatory Visit (INDEPENDENT_AMBULATORY_CARE_PROVIDER_SITE_OTHER): Payer: Medicare HMO | Admitting: Family Medicine

## 2019-04-25 ENCOUNTER — Other Ambulatory Visit: Payer: Self-pay

## 2019-04-25 ENCOUNTER — Encounter (INDEPENDENT_AMBULATORY_CARE_PROVIDER_SITE_OTHER): Payer: Self-pay

## 2019-04-25 VITALS — BP 145/78 | Ht 60.0 in | Wt 203.0 lb

## 2019-04-25 DIAGNOSIS — I25119 Atherosclerotic heart disease of native coronary artery with unspecified angina pectoris: Secondary | ICD-10-CM | POA: Diagnosis not present

## 2019-04-25 DIAGNOSIS — E1121 Type 2 diabetes mellitus with diabetic nephropathy: Secondary | ICD-10-CM

## 2019-04-25 DIAGNOSIS — F418 Other specified anxiety disorders: Secondary | ICD-10-CM

## 2019-04-25 DIAGNOSIS — I10 Essential (primary) hypertension: Secondary | ICD-10-CM

## 2019-04-25 DIAGNOSIS — K219 Gastro-esophageal reflux disease without esophagitis: Secondary | ICD-10-CM | POA: Diagnosis not present

## 2019-04-25 DIAGNOSIS — F17210 Nicotine dependence, cigarettes, uncomplicated: Secondary | ICD-10-CM

## 2019-04-25 DIAGNOSIS — F17218 Nicotine dependence, cigarettes, with other nicotine-induced disorders: Secondary | ICD-10-CM

## 2019-04-25 DIAGNOSIS — E559 Vitamin D deficiency, unspecified: Secondary | ICD-10-CM

## 2019-04-25 DIAGNOSIS — Z599 Problem related to housing and economic circumstances, unspecified: Secondary | ICD-10-CM | POA: Diagnosis not present

## 2019-04-25 DIAGNOSIS — E785 Hyperlipidemia, unspecified: Secondary | ICD-10-CM

## 2019-04-25 HISTORY — DX: Problem related to housing and economic circumstances, unspecified: Z59.9

## 2019-04-25 MED ORDER — FAMOTIDINE 20 MG PO TABS: 20.0000 mg | ORAL_TABLET | Freq: Two times a day (BID) | ORAL | 5 refills | Status: DC

## 2019-04-25 NOTE — Assessment & Plan Note (Signed)
Reports no plumbing, no money to fix plumbing , daughter and grand daughter incarcerated and the only one at home is ion training for ? Nursing Depressed, crying , sad, overwhelmed, n o help. Refer to Education officer, museum

## 2019-04-25 NOTE — Progress Notes (Signed)
Virtual Visit via Telephone Note  I connected with Tammy Suarez on 04/25/19 at  9:40 AM EDT by telephone and verified that I am speaking with the correct person using two identifiers.  Location: Patient: home  Provider: office   I discussed the limitations, risks, security and privacy concerns of performing an evaluation and management service by telephone and the availability of in person appointments. I also discussed with the patient that there may be a patient responsible charge related to this service. The patient expressed understanding and agreed to proceed. This visit type is conducted due to national recommendations for restrictions regarding the COVID -19 Pandemic. Due to the patient's age and / or co morbidities, this format is felt to be most appropriate at this time without adequate follow up. The patient has no access to video technology/ had technical difficulties with video, requiring transitioning to audio format  only ( telephone ). All issues noted this document were discussed and addressed,no physical exam can be performed in this format.    History of Present Illness: Increased exertional fatigue x 1 year, increasingly stressed as has not got finances to repair her home, pluming a problem, any help she can het is needed and  Greatly appreciated, her daughter and grand daughter are incarcerated Denies recent fever or chills. Denies sinus pressure, nasal congestion, ear pain or sore throat. Denies chest congestion, productive cough or wheezing. Denies chest pains, palpitations and leg swelling Denies abdominal pain, nausea, vomiting,diarrhea or constipation.   Denies dysuria, frequency, hesitancy has chronic  incontinence. C/o chronic joint pain, swelling and limitation in mobility. Denies headaches, seizures, numbness, or tingling. C/o  depression, anxiety mainly due to family stress, not suicidal or homicidal, often feels overwhelmed and helpless. Denies skin break  down or rash. Denies polyuria, polydipsia, blurred vision , or hypoglycemic episodes.        Observations/Objective:  BP (!) 145/78   Ht 5' (1.524 m)   Wt 203 lb (92.1 kg)   BMI 39.65 kg/m  Good communication with no confusion and intact memory. Alert and oriented x 3 No signs of respiratory distress during speech   Assessment and Plan: DM (diabetes mellitus), type 2 with renal complications ([place]) Ms. Magro is reminded of the importance of commitment to daily physical activity for 30 minutes or more, as able and the need to limit carbohydrate intake to 30 to 60 grams per meal to help with blood sugar control.   The need to take medication as prescribed, test blood sugar as directed, and to call between visits if there is a concern that blood sugar is uncontrolled is also discussed.   Ms. Else is reminded of the importance of daily foot exam, annual eye examination, and good blood sugar, blood pressure and cholesterol control. Poor control, updated lab needed, and eye exam is past due  Diabetic Labs Latest Ref Rng & Units 12/22/2018 07/26/2018 04/01/2018 02/04/2018 10/29/2017  HbA1c <5.7 % of total Hgb 7.8(H) 7.0(H) 6.9(H) - 6.7(H)  Microalbumin mg/dL - - - - 13.3  Micro/Creat Ratio <30 mcg/mg creat - - - - 132(H)  Chol <200 mg/dL - - 110 - 129  HDL >50 mg/dL - - 27(L) - 23(L)  Calc LDL mg/dL (calc) - - 51 - 75  Triglycerides <150 mg/dL - - 269(H) - 216(H)  Creatinine 0.60 - 0.93 mg/dL 1.35(H) 1.36(H) 1.37(H) 1.44(H) 1.27(H)   BP/Weight 04/25/2019 04/05/2019 12/22/2018 07/26/2018 05/16/2018 05/05/2018 03/10/9562  Systolic BP 875 643 329 518 174 150 142  Diastolic BP 78 67 80 78 80 80 80  Wt. (Lbs) 203 202 202 190 188 186 187  BMI 39.65 39.45 39.45 37.11 31.28 30.95 31.12   Foot/eye exam completion dates Latest Ref Rng & Units 05/05/2018 01/26/2018  Eye Exam No Retinopathy - No Retinopathy  Foot exam Order - - -  Foot Form Completion - Done -        Economic stress Reports  no plumbing, no money to fix plumbing , daughter and grand daughter incarcerated and the only one at home is ion training for ? Nursing Depressed, crying , sad, overwhelmed, n o help. Refer to social worker  Coronary atherosclerosis of native coronary artery Reports increased fatigue and poor exercise tolerance. Still smoking , needs f/u with card ,referral entered  Essential hypertension, benign Uncontrolled, needs in office eval, cardiology f/u of cAD overdue , pt referred DASH diet and commitment to daily physical activity for a minimum of 30 minutes discussed and encouraged, as a part of hypertension management. The importance of attaining a healthy weight is also discussed.  BP/Weight 04/25/2019 04/05/2019 12/22/2018 07/26/2018 05/16/2018 05/05/2018 3/74/8270  Systolic BP 786 754 492 010 071 219 758  Diastolic BP 78 67 80 78 80 80 80  Wt. (Lbs) 203 202 202 190 188 186 187  BMI 39.65 39.45 39.45 37.11 31.28 30.95 31.12       Depression with anxiety Chronic uncontrolled , due to economic and family stress , referred to Education officer, museum for any assistance possible  Morbid obesity Obesity associated with arthritis, hypertension, diabetes, depression. Patient re-educated about  the importance of commitment to a  minimum of 150 minutes of exercise per week as able.  The importance of healthy food choices with portion control discussed, as well as eating regularly and within a 12 hour window most days. The need to choose "clean , green" food 50 to 75% of the time is discussed, as well as to make water the primary drink and set a goal of 64 ounces water daily.    Weight /BMI 04/25/2019 04/05/2019 12/22/2018  WEIGHT 203 lb 202 lb 202 lb  HEIGHT 5\' 0"  5\' 0"  5\' 0"   BMI 39.65 kg/m2 39.45 kg/m2 39.45 kg/m2      Nicotine dependence Asked:confirms currently smokes cigarettes, 13/day Assess: Unwilling to quit but cutting back Advise: needs to QUIT to reduce risk of cancer, cardio and cerebrovascular  disease, already has CAD Assist: counseled for 5 minutes and literature provided Arrange: follow up in 3 months     Follow Up Instructions:    I discussed the assessment and treatment plan with the patient. The patient was provided an opportunity to ask questions and all were answered. The patient agreed with the plan and demonstrated an understanding of the instructions.   The patient was advised to call back or seek an in-person evaluation if the symptoms worsen or if the condition fails to improve as anticipated.  I provided 30  minutes of non-face-to-face time during this encounter.   Tula Nakayama, MD

## 2019-04-25 NOTE — Patient Instructions (Addendum)
Annual physical exam with MD in  12 weeks, call if you need me sooner  Mammogram is scheduled , please keep appt  You are referred for eye exam, Dr . Precious Bard in [place]  We will reach out to social worker re your current concern, and  Get back in touch with you   Labs past due please get as soon as possible, fasting lipid, hBA1c, tSH, cmp and eGFR and vit D  You are referred to Dr Domenic Polite for f/u  Please cut back to 13 cig/ day, then down to 11 , trying to [place] again, now at [place] day  Social distancing. Frequent hand washing with soap and water Keeping your hands off of your face. These 3 practices will help to keep both you and your community healthy during this time. Please practice them faithfully! Thanks for choosing [facility], we consider it a privelige to serve you.    Steps to Quit Smoking  Smoking tobacco can be bad for your health. It can also affect almost every organ in your body. Smoking puts you and people around you at risk for many serious long-lasting (chronic) diseases. Quitting smoking is hard, but it is one of the best things that you can do for your health. It is never too late to quit. What are the benefits of quitting smoking? When you quit smoking, you lower your risk for getting serious diseases and conditions. They can include:  Lung cancer or lung disease.  Heart disease.  Stroke.  Heart attack.  Not being able to have children (infertility).  Weak bones (osteoporosis) and broken bones (fractures). If you have coughing, wheezing, and shortness of breath, those symptoms may get better when you quit. You may also get sick less often. If you are pregnant, quitting smoking can help to lower your chances of having a baby of low birth weight. What can I do to help me quit smoking? Talk with your doctor about what can help you quit smoking. Some things you can do (strategies) include:  Quitting smoking totally, instead of slowly cutting  back how much you smoke over a period of time.  Going to in-person counseling. You are more likely to quit if you go to many counseling sessions.  Using resources and support systems, such as: ? Database administrator with a Social worker. ? Phone quitlines. ? Careers information officer. ? Support groups or group counseling. ? Text messaging programs. ? Mobile phone apps or applications.  Taking medicines. Some of these medicines may have nicotine in them. If you are pregnant or breastfeeding, do not take any medicines to quit smoking unless your doctor says it is okay. Talk with your doctor about counseling or other things that can help you. Talk with your doctor about using more than one strategy at the same time, such as taking medicines while you are also going to in-person counseling. This can help make quitting easier. What things can I do to make it easier to quit? Quitting smoking might feel very hard at first, but there is a lot that you can do to make it easier. Take these steps:  Talk to your family and friends. Ask them to support and encourage you.  Call phone quitlines, reach out to support groups, or work with a Social worker.  Ask people who smoke to not smoke around you.  Avoid places that make you want (trigger) to smoke, such as: ? Bars. ? Parties. ? Smoke-break areas at work.  Spend time with people  who do not smoke.  Lower the stress in your life. Stress can make you want to smoke. Try these things to help your stress: ? Getting regular exercise. ? Deep-breathing exercises. ? Yoga. ? Meditating. ? Doing a body scan. To do this, close your eyes, focus on one area of your body at a time from head to toe, and notice which parts of your body are tense. Try to relax the muscles in those areas.  Download or buy apps on your mobile phone or tablet that can help you stick to your quit plan. There are many free apps, such as QuitGuide from the [employer] Office manager for Disease Control and  Prevention). You can find more support from smokefree.gov and other websites. This information is not intended to replace advice given to you by your health care provider. Make sure you discuss any questions you have with your health care provider. Document Released: 08/29/2009 Document Revised: 06/30/2016 Document Reviewed: 03/19/2015 Elsevier Interactive Patient Education  2019 [employer].

## 2019-04-25 NOTE — Assessment & Plan Note (Signed)
Ms. Kapusta is reminded of the importance of commitment to daily physical activity for 30 minutes or more, as able and the need to limit carbohydrate intake to 30 to 60 grams per meal to help with blood sugar control.   The need to take medication as prescribed, test blood sugar as directed, and to call between visits if there is a concern that blood sugar is uncontrolled is also discussed.   Ms. Fonseca is reminded of the importance of daily foot exam, annual eye examination, and good blood sugar, blood pressure and cholesterol control. Poor control, updated lab needed, and eye exam is past due  Diabetic Labs Latest Ref Rng & Units 12/22/2018 07/26/2018 04/01/2018 02/04/2018 10/29/2017  HbA1c <5.7 % of total Hgb 7.8(H) 7.0(H) 6.9(H) - 6.7(H)  Microalbumin mg/dL - - - - 13.3  Micro/Creat Ratio <30 mcg/mg creat - - - - 132(H)  Chol <200 mg/dL - - 110 - 129  HDL >50 mg/dL - - 27(L) - 23(L)  Calc LDL mg/dL (calc) - - 51 - 75  Triglycerides <150 mg/dL - - 269(H) - 216(H)  Creatinine 0.60 - 0.93 mg/dL 1.35(H) 1.36(H) 1.37(H) 1.44(H) 1.27(H)   BP/Weight 04/25/2019 04/05/2019 12/22/2018 07/26/2018 05/16/2018 05/05/2018 5/49/8264  Systolic BP 158 309 407 680 881 103 159  Diastolic BP 78 67 80 78 80 80 80  Wt. (Lbs) 203 202 202 190 188 186 187  BMI 39.65 39.45 39.45 37.11 31.28 30.95 31.12   Foot/eye exam completion dates Latest Ref Rng & Units 05/05/2018 01/26/2018  Eye Exam No Retinopathy - No Retinopathy  Foot exam Order - - -  Foot Form Completion - Done -

## 2019-04-25 NOTE — Assessment & Plan Note (Addendum)
Reports increased fatigue and poor exercise tolerance. Still smoking , needs f/u with card ,referral entered

## 2019-04-28 DIAGNOSIS — S42291A Other displaced fracture of upper end of right humerus, initial encounter for closed fracture: Secondary | ICD-10-CM | POA: Diagnosis not present

## 2019-04-28 DIAGNOSIS — W133XXA Fall through floor, initial encounter: Secondary | ICD-10-CM | POA: Diagnosis not present

## 2019-04-30 ENCOUNTER — Encounter: Payer: Self-pay | Admitting: Family Medicine

## 2019-04-30 NOTE — Assessment & Plan Note (Signed)
Asked:confirms currently smokes cigarettes, 13/day Assess: Unwilling to quit but cutting back Advise: needs to QUIT to reduce risk of cancer, cardio and cerebrovascular disease, already has CAD Assist: counseled for 5 minutes and literature provided Arrange: follow up in 3 months

## 2019-04-30 NOTE — Assessment & Plan Note (Addendum)
Obesity associated with arthritis, hypertension, diabetes, depression. Patient re-educated about  the importance of commitment to a  minimum of 150 minutes of exercise per week as able.  The importance of healthy food choices with portion control discussed, as well as eating regularly and within a 12 hour window most days. The need to choose "clean , green" food 50 to 75% of the time is discussed, as well as to make water the primary drink and set a goal of 64 ounces water daily.    Weight /BMI 04/25/2019 04/05/2019 12/22/2018  WEIGHT 203 lb 202 lb 202 lb  HEIGHT 5\' 0"  5\' 0"  5\' 0"   BMI 39.65 kg/m2 39.45 kg/m2 39.45 kg/m2

## 2019-04-30 NOTE — Assessment & Plan Note (Signed)
Chronic uncontrolled , due to economic and family stress , referred to Education officer, museum for any assistance possible

## 2019-04-30 NOTE — Assessment & Plan Note (Signed)
Uncontrolled, needs in office eval, cardiology f/u of cAD overdue , pt referred DASH diet and commitment to daily physical activity for a minimum of 30 minutes discussed and encouraged, as a part of hypertension management. The importance of attaining a healthy weight is also discussed.  BP/Weight 04/25/2019 04/05/2019 12/22/2018 07/26/2018 05/16/2018 05/05/2018 0/30/0923  Systolic BP 300 762 263 335 456 256 389  Diastolic BP 78 67 80 78 80 80 80  Wt. (Lbs) 203 202 202 190 188 186 187  BMI 39.65 39.45 39.45 37.11 31.28 30.95 31.12

## 2019-05-02 ENCOUNTER — Other Ambulatory Visit: Payer: Self-pay

## 2019-05-02 ENCOUNTER — Ambulatory Visit (INDEPENDENT_AMBULATORY_CARE_PROVIDER_SITE_OTHER): Payer: Medicare HMO | Admitting: Family Medicine

## 2019-05-02 ENCOUNTER — Encounter: Payer: Self-pay | Admitting: Family Medicine

## 2019-05-02 VITALS — BP 145/78 | Ht 60.0 in | Wt 203.0 lb

## 2019-05-02 DIAGNOSIS — S4291XK Fracture of right shoulder girdle, part unspecified, subsequent encounter for fracture with nonunion: Secondary | ICD-10-CM | POA: Diagnosis not present

## 2019-05-02 DIAGNOSIS — S4291XA Fracture of right shoulder girdle, part unspecified, initial encounter for closed fracture: Secondary | ICD-10-CM | POA: Insufficient documentation

## 2019-05-02 DIAGNOSIS — F17218 Nicotine dependence, cigarettes, with other nicotine-induced disorders: Secondary | ICD-10-CM

## 2019-05-02 DIAGNOSIS — I10 Essential (primary) hypertension: Secondary | ICD-10-CM

## 2019-05-02 NOTE — Progress Notes (Signed)
Virtual Visit via Telephone Note  I connected with Tammy Suarez on 05/02/19 at  2:00 PM EDT by telephone and verified that I am speaking with the correct person using two identifiers.  Location: Patient: home Provider: office   I discussed the limitations, risks, security and privacy concerns of performing an evaluation and management service by telephone and the availability of in person appointments. I also discussed with the patient that there may be a patient responsible charge related to this service. The patient expressed understanding and agreed to proceed. This visit type is conducted due to national recommendations for restrictions regarding the COVID -19 Pandemic. Due to the patient's age and / or co morbidities, this format is felt to be most appropriate at this time without adequate follow up. The patient has no access to video technology/ had technical difficulties with video, requiring transitioning to audio format  only ( telephone ). All issues noted this document were discussed and addressed,no physical exam can be performed in this format.    History of Present Illness: Pt fell 3 ays prior and was evaluated at a hospital, dx with fracture of shoulder , record reviewe confirms this and she needs Ortho eval and  mx asap Denies recent fever or chills. Denies sinus pressure, nasal congestion, ear pain or sore throat. Denies chest congestion, productive cough or wheezing. Denies chest pains, palpitations and leg swelling Denies abdominal pain, nausea, vomiting,diarrhea or constipation.   Denies dysuria, frequency, hesitancy or incontinence. Denies headaches, seizures, numbness, or tingling. C/o chronic depression, anxiety and  insomnia. Denies skin break down or rash.       Observations/Objective: BP (!) 145/78   Ht 5' (1.524 m)   Wt 203 lb (92.1 kg)   BMI 39.65 kg/m  Good communication with no confusion and intact memory. Alert and oriented x 3 No signs of  respiratory distress during speech    Assessment and Plan: Fracture of right shoulder Told 3 days ago hat she had a fracture of her right shoulder, needs ortho asap  Nicotine dependence Asked:confirms currently smokes cigarettes Assess: Unwilling to quit but cutting back Advise: needs to QUIT to reduce risk of cancer, cardio and cerebrovascular disease Assist: counseled for 5 minutes and literature provided Arrange: follow up in 3 months   Morbid obesity Obesity associated with hypertension and diabetes  Patient re-educated about  the importance of commitment to a  minimum of 150 minutes of exercise per week as able.  The importance of healthy food choices with portion control discussed, as well as eating regularly and within a 12 hour window most days. The need to choose "clean , green" food 50 to 75% of the time is discussed, as well as to make water the primary drink and set a goal of 64 ounces water daily.    Weight /BMI 05/03/2019 05/02/2019 04/25/2019  WEIGHT 194 lb 203 lb 203 lb  HEIGHT 5\' 5"  5\' 0"  5\' 0"   BMI 32.28 kg/m2 39.65 kg/m2 39.65 kg/m2           Essential hypertension, benign Uncontrolled , will adjust med at in office visit DASH diet and commitment to daily physical activity for a minimum of 30 minutes discussed and encouraged, as a part of hypertension management. The importance of attaining a healthy weight is also discussed.  BP/Weight 05/03/2019 05/02/2019 04/25/2019 04/05/2019 12/22/2018 6/76/7209 02/20/961  Systolic BP 836 629 476 546 503 546 568  Diastolic BP 62 78 78 67 80 78 80  Wt. (Lbs)  194 203 203 202 202 190 188  BMI 32.28 39.65 39.65 39.45 39.45 37.11 31.28         Follow Up Instructions:    I discussed the assessment and treatment plan with the patient. The patient was provided an opportunity to ask questions and all were answered. The patient agreed with the plan and demonstrated an understanding of the instructions.   The patient was  advised to call back or seek an in-person evaluation if the symptoms worsen or if the condition fails to improve as anticipated.  I provided 22 minutes of non-face-to-face time during this encounter.   Tula Nakayama, MD

## 2019-05-02 NOTE — Patient Instructions (Signed)
F/U as before, call if you need me sooner  You are referred urgently to Dr Luna Glasgow or Aline Brochure for an appointment re fracture of shoulder you sustained 3 days ago,   Please be careful to avoid further falls

## 2019-05-02 NOTE — Assessment & Plan Note (Signed)
Told 3 days ago hat she had a fracture of her right shoulder, needs ortho asap

## 2019-05-03 ENCOUNTER — Encounter: Payer: Self-pay | Admitting: Orthopedic Surgery

## 2019-05-03 ENCOUNTER — Other Ambulatory Visit: Payer: Self-pay

## 2019-05-03 ENCOUNTER — Ambulatory Visit: Payer: Medicare HMO | Admitting: Orthopedic Surgery

## 2019-05-03 ENCOUNTER — Ambulatory Visit: Payer: Medicare HMO | Admitting: Family Medicine

## 2019-05-03 VITALS — BP 115/62 | HR 63 | Temp 97.2°F | Ht 65.0 in | Wt 194.0 lb

## 2019-05-03 DIAGNOSIS — S42294A Other nondisplaced fracture of upper end of right humerus, initial encounter for closed fracture: Secondary | ICD-10-CM | POA: Diagnosis not present

## 2019-05-03 NOTE — Addendum Note (Signed)
Addended by: Carole Civil on: 05/03/2019 02:54 PM   Modules accepted: Level of Service

## 2019-05-03 NOTE — Progress Notes (Signed)
Patient ID: Tammy Suarez, female   DOB: 05-31-1946, 73 y.o.   MRN: [id number]  Chief Complaint  Patient presents with  . Shoulder Injury    [place] and fx'd rt shoulder DOI 04/29/19    HPI Tammy Suarez is a 73 y.o. female. 79 female had a history of rotator cuff declined surgery then fell on Saturday, June 13 injured her right shoulder went to the ER x-rays were obtained they are here on a disc  Complains of mild pain dull nonradiating focal right proximal humerus and arm some in the elbow no numbness tingling or paresthesias   Review of Systems Review of Systems  Respiratory: Positive for shortness of breath.   Musculoskeletal: Positive for joint swelling.  Neurological: Negative for numbness.     Past Medical History:  Diagnosis Date  . Arthritis   . Cancer (HCC)    Uterine  . Carpal tunnel syndrome    bilaterally  . Chronic back pain   . CKD (chronic kidney disease) stage 3, GFR 30-59 ml/min (HCC)   . Coronary atherosclerosis of native coronary artery    Multivessel status post CABG - LIMA to LAD, SVG to OM and SVG to PDA  . Depression   . Diabetes mellitus without complication ([place])   . Essential hypertension, benign   . GERD (gastroesophageal reflux disease)   . Headache   . History of conjunctivitis   . History of hiatal hernia   . Hyperlipidemia   . Hypothyroidism   . Myocardial infarction ([place])   . Obesity   . Vitamin B 12 deficiency     Past Surgical History:  Procedure Laterality Date  . ABDOMINAL HYSTERECTOMY    . BACK SURGERY    . CARDIAC SURGERY    . CARPAL TUNNEL RELEASE Left 07/23/2016   Procedure: CARPAL TUNNEL RELEASE;  Surgeon: Carole Civil, MD;  Location: [facility];  Service: Orthopedics;  Laterality: Left;  . CERVICAL LAMINECTOMY    . CHOLECYSTECTOMY    . CORONARY ARTERY BYPASS GRAFT N/A 11/02/2013   Procedure: CORONARY ARTERY BYPASS GRAFTING (CABG);  Surgeon: Gaye Pollack, MD;  Location: [facility];  Service: Open Heart Surgery;   Laterality: N/A;  CABG x three, using left internal mammary artery and right leg greater saphenous vein harvested endoscopically  . COSMETIC SURGERY  07/01/2012   Recurrent skin infection on lower abdomen, [facility]  . HERNIA REPAIR    . Inguinal herniorrhaphy    . INTRAOPERATIVE TRANSESOPHAGEAL ECHOCARDIOGRAM N/A 11/02/2013   Procedure: INTRAOPERATIVE TRANSESOPHAGEAL ECHOCARDIOGRAM;  Surgeon: Gaye Pollack, MD;  Location: [facility] OR;  Service: Open Heart Surgery;  Laterality: N/A;  . LAPAROSCOPIC GASTRIC BANDING  2009  . LEFT HEART CATHETERIZATION WITH CORONARY ANGIOGRAM N/A 10/30/2013   Procedure: LEFT HEART CATHETERIZATION WITH CORONARY ANGIOGRAM;  Surgeon: Minus Breeding, MD;  Location: [facility];  Service: Cardiovascular;  Laterality: N/A;  . Left index finger repair    . LUMBAR FUSION    . SPINE SURGERY    . VESICOVAGINAL FISTULA CLOSURE W/ TAH      Family History  Problem Relation Age of Onset  . Heart attack Mother   . Heart attack Father   . Hypertension Sister   . Heart attack Sister   . Cancer Brother        Renal cell   . Hepatitis Daughter   . Drug abuse Daughter   . Hypertension Daughter     Social History Social History   Tobacco Use  .  Smoking status: Current Every Day Smoker    Packs/day: 0.25    Years: 50.00    Pack years: 12.50    Types: Cigarettes  . Smokeless tobacco: Never Used  . Tobacco comment: hasn't smoked since saturday   Substance Use Topics  . Alcohol use: No    Alcohol/week: 0.0 standard drinks  . Drug use: No    Allergies  Allergen Reactions  . Gabapentin Other (See Comments)    Feels dizzy  . Meperidine Hcl Nausea And Vomiting  . Niacin Other (See Comments)    REACTION: face peeling and burning  . Propoxyphene N-Acetaminophen Nausea And Vomiting  . Hydrocodone-Acetaminophen Nausea And Vomiting    Current Outpatient Medications  Medication Sig Dispense Refill  . ACCU-CHEK AVIVA PLUS test strip AS DIRECTED 300 each 2  . ACCU-CHEK  FASTCLIX LANCETS MISC AS DIRECTED 306 each 3  . aspirin EC 81 MG tablet Take 81 mg by mouth daily.    . Aspirin-Acetaminophen-Caffeine (GOODY HEADACHE PO) Take 1 Package by mouth 2 (two) times daily as needed (headache).    . blood glucose meter kit and supplies Dispense based on patient and insurance preference. Once daily testing dx e11.9 1 each 0  . clonazePAM (KLONOPIN) 1 MG tablet Take one tablet by mouth three times daily for anxiety 90 tablet 0  . DULoxetine (CYMBALTA) 60 MG capsule TAKE 1 CAPSULE BY MOUTH TWICE DAILY 180 capsule 2  . famotidine (PEPCID) 20 MG tablet Take 1 tablet (20 mg total) by mouth 2 (two) times daily. 60 tablet 5  . fenofibrate (TRICOR) 145 MG tablet TAKE 1 TABLET BY MOUTH EVERY DAY 90 tablet 2  . glipiZIDE (GLUCOTROL XL) 2.5 MG 24 hr tablet Take 1 tablet (2.5 mg total) by mouth daily with breakfast. 30 tablet 5  . isosorbide mononitrate (IMDUR) 30 MG 24 hr tablet Take 1 tablet (30 mg total) by mouth at bedtime. 90 tablet 3  . levothyroxine (SYNTHROID) 100 MCG tablet TAKE 1 TABLET BY MOUTH EVERY DAY 90 tablet 0  . losartan (COZAAR) 50 MG tablet TAKE 1 TABLET(50 MG) BY MOUTH DAILY 90 tablet 1  . metoprolol succinate (TOPROL-XL) 25 MG 24 hr tablet TAKE 1 TABLET BY MOUTH EVERY DAY 90 tablet 2  . Multiple Vitamin (MULTIVITAMIN) tablet Take 1 tablet by mouth daily.    . nitroGLYCERIN (NITROSTAT) 0.4 MG SL tablet DISSOLVE 1 TABLET UNDER THE TONGUE EVERY 5 MINUTES AS NEEDED FOR CHEST PAIN 25 tablet 3  . Omega-3 Fatty Acids (FISH OIL PO) Take 1 capsule by mouth daily.    Marland Kitchen oxyCODONE-acetaminophen (PERCOCET) 10-325 MG tablet TK 1 T PO  Q 6 H PRF CHRONIC PAIN  0  . rosuvastatin (CRESTOR) 40 MG tablet TAKE 1 TABLET BY MOUTH EVERY DAY 90 tablet 0  . hydrochlorothiazide (MICROZIDE) 12.5 MG capsule Take 1 capsule (12.5 mg total) by mouth daily. (Patient not taking: Reported on 05/02/2019) 30 capsule 4   No current facility-administered medications for this visit.         Physical Exam Blood pressure 115/62, pulse 63, temperature (!) 97.2 F (36.2 C), height _0  (1.651 m), weight 194 lb (88 kg). Physical Exam The patient is well developed well nourished and well groomed.  Orientation to person place and time is normal  Mood is pleasant.  Ambulatory status normal Cervical spine exam is as follows Supples  Right shoulder  Examination: Inspection of the shoulder shows that there is ecchymosis of the skin proximal humerus down into the  upper arm. Tenderness at the proximal humerus and fracture site is noted.  Range of motion examination is deferred because of pain.  Motor exam hand wrist elbow normal including normal muscle tone.  Shoulder stability tests deferred because of fracture, elbow stable wrist stable.  Neurovascular examination is intact and the lymph nodes in the axilla and supraclavicular regions are normal   The opposite shoulder has no swelling, normal range of motion, no joint contracture subluxation atrophy tremor or skin lesion. Neurovascular exam is intact.   MEDICAL DECISION SECTION  xrays ordered?  No Images photos are in the media section   My independent reading of xrays: Proximal humerus comminuted fracture appears to be primarily greater tuberosity with some other comminuted fragments    Assessment    Right proximal humerus fracture    Plan    Sling 2 weeks followed by x-ray of the shoulder  No orders of the defined types were placed in this encounter.

## 2019-05-03 NOTE — Patient Instructions (Signed)
Sling x 2 weeks Come back for x-rays

## 2019-05-04 ENCOUNTER — Ambulatory Visit (HOSPITAL_COMMUNITY): Admission: RE | Admit: 2019-05-04 | Payer: Medicare HMO | Source: Ambulatory Visit

## 2019-05-07 NOTE — Assessment & Plan Note (Addendum)
Obesity associated with hypertension and diabetes  Patient re-educated about  the importance of commitment to a  minimum of 150 minutes of exercise per week as able.  The importance of healthy food choices with portion control discussed, as well as eating regularly and within a 12 hour window most days. The need to choose "clean , green" food 50 to 75% of the time is discussed, as well as to make water the primary drink and set a goal of 64 ounces water daily.    Weight /BMI 05/03/2019 05/02/2019 04/25/2019  WEIGHT 194 lb 203 lb 203 lb  HEIGHT 5\' 5"  5\' 0"  5\' 0"   BMI 32.28 kg/m2 39.65 kg/m2 39.65 kg/m2

## 2019-05-07 NOTE — Assessment & Plan Note (Signed)
Uncontrolled , will adjust med at in office visit DASH diet and commitment to daily physical activity for a minimum of 30 minutes discussed and encouraged, as a part of hypertension management. The importance of attaining a healthy weight is also discussed.  BP/Weight 05/03/2019 05/02/2019 04/25/2019 04/05/2019 12/22/2018 03/16/1483 0/01/9794  Systolic BP 369 223 009 794 997 182 099  Diastolic BP 62 78 78 67 80 78 80  Wt. (Lbs) 194 203 203 202 202 190 188  BMI 32.28 39.65 39.65 39.45 39.45 37.11 31.28

## 2019-05-07 NOTE — Assessment & Plan Note (Signed)
Asked:confirms currently smokes cigarettes Assess: Unwilling to quit but cutting back Advise: needs to QUIT to reduce risk of cancer, cardio and cerebrovascular disease Assist: counseled for 5 minutes and literature provided Arrange: follow up in 3 months  

## 2019-05-15 DIAGNOSIS — S42201A Unspecified fracture of upper end of right humerus, initial encounter for closed fracture: Secondary | ICD-10-CM | POA: Insufficient documentation

## 2019-05-17 ENCOUNTER — Ambulatory Visit: Payer: Medicare HMO | Admitting: Orthopedic Surgery

## 2019-05-17 ENCOUNTER — Other Ambulatory Visit: Payer: Self-pay | Admitting: Family Medicine

## 2019-05-18 ENCOUNTER — Ambulatory Visit (INDEPENDENT_AMBULATORY_CARE_PROVIDER_SITE_OTHER): Payer: Medicare HMO | Admitting: Orthopedic Surgery

## 2019-05-18 ENCOUNTER — Other Ambulatory Visit: Payer: Self-pay | Admitting: Family Medicine

## 2019-05-18 ENCOUNTER — Other Ambulatory Visit: Payer: Self-pay

## 2019-05-18 ENCOUNTER — Telehealth: Payer: Self-pay

## 2019-05-18 ENCOUNTER — Ambulatory Visit (INDEPENDENT_AMBULATORY_CARE_PROVIDER_SITE_OTHER): Payer: Medicare HMO

## 2019-05-18 ENCOUNTER — Encounter: Payer: Self-pay | Admitting: Orthopedic Surgery

## 2019-05-18 VITALS — BP 120/68 | HR 84 | Temp 96.8°F | Ht 65.0 in | Wt 194.0 lb

## 2019-05-18 DIAGNOSIS — S42294D Other nondisplaced fracture of upper end of right humerus, subsequent encounter for fracture with routine healing: Secondary | ICD-10-CM

## 2019-05-18 DIAGNOSIS — E860 Dehydration: Secondary | ICD-10-CM

## 2019-05-18 LAB — COMPLETE METABOLIC PANEL WITH GFR
AG Ratio: 1.5 (calc) (ref 1.0–2.5)
ALT: 8 U/L (ref 6–29)
AST: 19 U/L (ref 10–35)
Albumin: 3.8 g/dL (ref 3.6–5.1)
Alkaline phosphatase (APISO): 91 U/L (ref 37–153)
BUN/Creatinine Ratio: 25 (calc) — ABNORMAL HIGH (ref 6–22)
BUN: 47 mg/dL — ABNORMAL HIGH (ref 7–25)
CO2: 28 mmol/L (ref 20–32)
Calcium: 9.9 mg/dL (ref 8.6–10.4)
Chloride: 102 mmol/L (ref 98–110)
Creat: 1.9 mg/dL — ABNORMAL HIGH (ref 0.60–0.93)
GFR, Est African American: 30 mL/min/{1.73_m2} — ABNORMAL LOW (ref >=60)
GFR, Est Non African American: 26 mL/min/{1.73_m2} — ABNORMAL LOW (ref >=60)
Globulin: 2.6 g/dL (calc) (ref 1.9–3.7)
Glucose, Bld: 161 mg/dL — ABNORMAL HIGH (ref 65–99)
Potassium: 4.7 mmol/L (ref 3.5–5.3)
Sodium: 137 mmol/L (ref 135–146)
Total Bilirubin: 0.4 mg/dL (ref 0.2–1.2)
Total Protein: 6.4 g/dL (ref 6.1–8.1)

## 2019-05-18 LAB — LIPID PANEL
Cholesterol: 104 mg/dL (ref <200)
HDL: 19 mg/dL — ABNORMAL LOW (ref >=50)
LDL Cholesterol (Calc): 51 mg/dL (calc)
Non-HDL Cholesterol (Calc): 85 mg/dL (calc) (ref <130)
Total CHOL/HDL Ratio: 5.5 (calc) — ABNORMAL HIGH (ref <5.0)
Triglycerides: 288 mg/dL — ABNORMAL HIGH (ref <150)

## 2019-05-18 LAB — TSH: TSH: 3.09 mIU/L (ref 0.40–4.50)

## 2019-05-18 LAB — VITAMIN D 25 HYDROXY (VIT D DEFICIENCY, FRACTURES): Vit D, 25-Hydroxy: 25 ng/mL — ABNORMAL LOW (ref 30–100)

## 2019-05-18 LAB — HEMOGLOBIN A1C
Hgb A1c MFr Bld: 6.2 % of total Hgb — ABNORMAL HIGH (ref <5.7)
Mean Plasma Glucose: 131 (calc)
eAG (mmol/L): 7.3 (calc)

## 2019-05-18 MED ORDER — GLIPIZIDE ER 2.5 MG PO TB24: ORAL_TABLET | ORAL | 3 refills | Status: DC

## 2019-05-18 MED ORDER — HYDROCHLOROTHIAZIDE 12.5 MG PO CAPS: 12.5000 mg | ORAL_CAPSULE | Freq: Every day | ORAL | 0 refills | Status: DC

## 2019-05-18 MED ORDER — CLONAZEPAM 1 MG PO TABS: ORAL_TABLET | ORAL | 2 refills | Status: DC

## 2019-05-18 NOTE — Progress Notes (Signed)
Encounter Diagnosis  Name Primary?  . Other closed nondisplaced fracture of proximal end of right humerus with routine healing, subsequent encounter 04/29/19 Yes   F/U right proximal humerus fracture   Limited motion   Xray looks ok   Start PT   Fu 2 months

## 2019-05-18 NOTE — Patient Instructions (Signed)
Start PT 

## 2019-05-18 NOTE — Progress Notes (Signed)
Dose reduction

## 2019-05-18 NOTE — Telephone Encounter (Signed)
Lab entered to be drawn in 3 weeks.

## 2019-05-18 NOTE — Addendum Note (Signed)
Addended byCandice Camp on: 05/18/2019 12:42 PM   Modules accepted: Orders

## 2019-05-26 ENCOUNTER — Telehealth: Payer: Self-pay | Admitting: Internal Medicine

## 2019-05-26 ENCOUNTER — Encounter: Payer: Self-pay | Admitting: Gastroenterology

## 2019-05-26 ENCOUNTER — Encounter: Payer: Self-pay | Admitting: Internal Medicine

## 2019-05-26 ENCOUNTER — Ambulatory Visit: Payer: Medicare HMO | Admitting: Gastroenterology

## 2019-05-26 NOTE — Telephone Encounter (Signed)
Patient was a no show and letter sent  °

## 2019-05-29 ENCOUNTER — Other Ambulatory Visit: Payer: Self-pay | Admitting: Family Medicine

## 2019-06-09 LAB — BASIC METABOLIC PANEL WITH GFR
BUN/Creatinine Ratio: 25 (calc) — ABNORMAL HIGH (ref 6–22)
BUN: 39 mg/dL — ABNORMAL HIGH (ref 7–25)
CO2: 25 mmol/L (ref 20–32)
Calcium: 9.8 mg/dL (ref 8.6–10.4)
Chloride: 103 mmol/L (ref 98–110)
Creat: 1.53 mg/dL — ABNORMAL HIGH (ref 0.60–0.93)
GFR, Est African American: 39 mL/min/{1.73_m2} — ABNORMAL LOW (ref >=60)
GFR, Est Non African American: 33 mL/min/{1.73_m2} — ABNORMAL LOW (ref >=60)
Glucose, Bld: 88 mg/dL (ref 65–99)
Potassium: 4.9 mmol/L (ref 3.5–5.3)
Sodium: 138 mmol/L (ref 135–146)

## 2019-06-26 ENCOUNTER — Other Ambulatory Visit: Payer: Self-pay | Admitting: Family Medicine

## 2019-07-06 ENCOUNTER — Other Ambulatory Visit: Payer: Self-pay

## 2019-07-06 DIAGNOSIS — Z20822 Contact with and (suspected) exposure to covid-19: Secondary | ICD-10-CM

## 2019-07-08 LAB — NOVEL CORONAVIRUS, NAA: SARS-CoV-2, NAA: NOT DETECTED

## 2019-07-14 ENCOUNTER — Other Ambulatory Visit: Payer: Self-pay | Admitting: Family Medicine

## 2019-07-18 ENCOUNTER — Encounter: Payer: Medicare HMO | Admitting: Family Medicine

## 2019-07-19 ENCOUNTER — Other Ambulatory Visit: Payer: Self-pay

## 2019-07-19 ENCOUNTER — Encounter: Payer: Self-pay | Admitting: Orthopedic Surgery

## 2019-07-19 ENCOUNTER — Ambulatory Visit (INDEPENDENT_AMBULATORY_CARE_PROVIDER_SITE_OTHER): Payer: Medicare HMO | Admitting: Orthopedic Surgery

## 2019-07-19 VITALS — Ht 65.0 in | Wt 201.0 lb

## 2019-07-19 DIAGNOSIS — S42294D Other nondisplaced fracture of upper end of right humerus, subsequent encounter for fracture with routine healing: Secondary | ICD-10-CM

## 2019-07-19 NOTE — Progress Notes (Signed)
Fracture care follow-up  Right shoulder  X-ray right shoulder from July 2 shows a comminuted fracture the proximal humerus with reasonable alignment of the head shaft and tuberosity fractures which are nondisplaced  Patient presents for follow-up with stiffness  Chief Complaint  Patient presents with  . Shoulder Injury    right shoulder fracture 04/29/2019 improving but still limited ROM     Patient has extreme stiffness in terms of active motion but passive motion 150 degrees of flexion 90 degrees of abduction 45 degrees external rotation  Recommend [place] exercises at home as she is reluctant to do therapy formally  Encounter Diagnosis  Name Primary?  . Other closed nondisplaced fracture of proximal end of right humerus with routine healing, subsequent encounter 04/29/19 Yes    Follow-up as needed

## 2019-07-19 NOTE — Patient Instructions (Signed)
Home exercises  Follow-up as needed

## 2019-07-20 ENCOUNTER — Other Ambulatory Visit: Payer: Self-pay | Admitting: Family Medicine

## 2019-07-25 ENCOUNTER — Other Ambulatory Visit: Payer: Self-pay | Admitting: Family Medicine

## 2019-08-18 ENCOUNTER — Other Ambulatory Visit: Payer: Self-pay | Admitting: Family Medicine

## 2019-08-21 ENCOUNTER — Ambulatory Visit: Payer: Medicare HMO | Admitting: Family Medicine

## 2019-08-24 ENCOUNTER — Other Ambulatory Visit: Payer: Self-pay

## 2019-08-24 ENCOUNTER — Ambulatory Visit (INDEPENDENT_AMBULATORY_CARE_PROVIDER_SITE_OTHER): Payer: Medicare HMO | Admitting: Family Medicine

## 2019-08-24 ENCOUNTER — Encounter: Payer: Self-pay | Admitting: Family Medicine

## 2019-08-24 VITALS — BP 134/78 | HR 67 | Temp 97.4°F | Resp 15 | Ht 65.0 in | Wt 203.4 lb

## 2019-08-24 DIAGNOSIS — N39498 Other specified urinary incontinence: Secondary | ICD-10-CM

## 2019-08-24 DIAGNOSIS — E785 Hyperlipidemia, unspecified: Secondary | ICD-10-CM

## 2019-08-24 DIAGNOSIS — Z1231 Encounter for screening mammogram for malignant neoplasm of breast: Secondary | ICD-10-CM

## 2019-08-24 DIAGNOSIS — I10 Essential (primary) hypertension: Secondary | ICD-10-CM

## 2019-08-24 DIAGNOSIS — Z23 Encounter for immunization: Secondary | ICD-10-CM | POA: Diagnosis not present

## 2019-08-24 DIAGNOSIS — F17210 Nicotine dependence, cigarettes, uncomplicated: Secondary | ICD-10-CM | POA: Diagnosis not present

## 2019-08-24 DIAGNOSIS — K219 Gastro-esophageal reflux disease without esophagitis: Secondary | ICD-10-CM

## 2019-08-24 DIAGNOSIS — Z780 Asymptomatic menopausal state: Secondary | ICD-10-CM

## 2019-08-24 DIAGNOSIS — E1121 Type 2 diabetes mellitus with diabetic nephropathy: Secondary | ICD-10-CM

## 2019-08-24 DIAGNOSIS — F17218 Nicotine dependence, cigarettes, with other nicotine-induced disorders: Secondary | ICD-10-CM

## 2019-08-24 MED ORDER — FLUOXETINE HCL 20 MG PO TABS: 20.0000 mg | ORAL_TABLET | Freq: Every day | ORAL | 3 refills | Status: DC

## 2019-08-24 MED ORDER — CLONAZEPAM 1 MG PO TABS: ORAL_TABLET | ORAL | 3 refills | Status: DC

## 2019-08-24 MED ORDER — CLONAZEPAM 1 MG PO TABS: ORAL_TABLET | ORAL | 5 refills | Status: DC

## 2019-08-24 MED ORDER — GLIPIZIDE ER 2.5 MG PO TB24: ORAL_TABLET | ORAL | 1 refills | Status: DC

## 2019-08-24 MED ORDER — ALENDRONATE SODIUM 70 MG PO TABS: 70.0000 mg | ORAL_TABLET | ORAL | 11 refills | Status: DC

## 2019-08-24 MED ORDER — LEVOTHYROXINE SODIUM 112 MCG PO TABS: 112.0000 ug | ORAL_TABLET | Freq: Every day | ORAL | 1 refills | Status: DC

## 2019-08-24 MED ORDER — FLUOXETINE HCL 20 MG PO CAPS: 20.0000 mg | ORAL_CAPSULE | Freq: Every day | ORAL | 3 refills | Status: DC

## 2019-08-24 MED ORDER — ROSUVASTATIN CALCIUM 40 MG PO TABS: 40.0000 mg | ORAL_TABLET | Freq: Every day | ORAL | 1 refills | Status: DC

## 2019-08-24 MED ORDER — SOLIFENACIN SUCCINATE 5 MG PO TABS: 5.0000 mg | ORAL_TABLET | Freq: Every day | ORAL | 3 refills | Status: DC

## 2019-08-24 NOTE — Patient Instructions (Addendum)
Annual physical exam as before  Please schedule mammogram and dexa at discharge  Flu vaccine tioday  Please get fasting lipid, cmp and EGFr, TSH and CBC  5 days before next visit  New is vesicare for incontinence Fluoxetine for depression  New is once weekly fosamax for bones Please start calcium 1000 to 1200 mg every day and vit D 3 1000 IU every day

## 2019-08-25 ENCOUNTER — Telehealth: Payer: Self-pay | Admitting: Family Medicine

## 2019-08-25 NOTE — Telephone Encounter (Signed)
Pt is having trouble getting her medication --please call

## 2019-08-25 NOTE — Telephone Encounter (Signed)
Her kidney function is not good, no she should not have this. Pls let her know

## 2019-08-25 NOTE — Telephone Encounter (Signed)
Advised patient that Dr.Simpson doesn't want her taking the meloxicam with verbal understanding.

## 2019-08-25 NOTE — Telephone Encounter (Signed)
Patient called wanting her Meloxicam refilled. It isn't on her active medication list. Do you still want her to take this? Please advise.

## 2019-08-26 ENCOUNTER — Encounter: Payer: Self-pay | Admitting: Family Medicine

## 2019-08-26 NOTE — Assessment & Plan Note (Signed)
Hyperlipidemia:Low fat diet discussed and encouraged.   Lipid Panel  Lab Results  Component Value Date   CHOL 104 05/17/2019   HDL 19 (L) 05/17/2019   LDLCALC 51 05/17/2019   TRIG 288 (H) 05/17/2019   CHOLHDL 5.5 (H) 05/17/2019   Needs to reduce fat intake

## 2019-08-26 NOTE — Assessment & Plan Note (Signed)
Controlled, no change in medication DASH diet and commitment to daily physical activity for a minimum of 30 minutes discussed and encouraged, as a part of hypertension management. The importance of attaining a healthy weight is also discussed.  BP/Weight 08/24/2019 07/19/2019 05/18/2019 05/03/2019 05/02/2019 04/25/2019 5/59/7416  Systolic BP 384 - 536 468 032 122 482  Diastolic BP 78 - 68 62 78 78 67  Wt. (Lbs) 203.4 201 194 194 203 203 202  BMI 33.85 33.45 32.28 32.28 39.65 39.65 39.45

## 2019-08-26 NOTE — Assessment & Plan Note (Signed)
Asked:confirms currently smokes cigarettes Assess: Unwilling to quit but cutting back Advise: needs to QUIT to reduce risk of cancer, cardio and cerebrovascular disease Assist: counseled for 5 minutes and literature provided Arrange: follow up in 3 months  

## 2019-08-26 NOTE — Assessment & Plan Note (Signed)
Tammy Suarez is reminded of the importance of commitment to daily physical activity for 30 minutes or more, as able and the need to limit carbohydrate intake to 30 to 60 grams per meal to help with blood sugar control.   The need to take medication as prescribed, test blood sugar as directed, and to call between visits if there is a concern that blood sugar is uncontrolled is also discussed.   Tammy Suarez is reminded of the importance of daily foot exam, annual eye examination, and good blood sugar, blood pressure and cholesterol control Controlled, no change in medication .  Diabetic Labs Latest Ref Rng & Units 06/08/2019 05/17/2019 12/22/2018 07/26/2018 04/01/2018  HbA1c <5.7 % of total Hgb - 6.2(H) 7.8(H) 7.0(H) 6.9(H)  Microalbumin mg/dL - - - - -  Micro/Creat Ratio <30 mcg/mg creat - - - - -  Chol <200 mg/dL - 104 - - 110  HDL > OR = 50 mg/dL - 19(L) - - 27(L)  Calc LDL mg/dL (calc) - 51 - - 51  Triglycerides <150 mg/dL - 288(H) - - 269(H)  Creatinine 0.60 - 0.93 mg/dL 1.53(H) 1.90(H) 1.35(H) 1.36(H) 1.37(H)   BP/Weight 08/24/2019 07/19/2019 05/18/2019 05/03/2019 05/02/2019 04/25/2019 4/80/1655  Systolic BP 374 - 827 078 675 449 201  Diastolic BP 78 - 68 62 78 78 67  Wt. (Lbs) 203.4 201 194 194 203 203 202  BMI 33.85 33.45 32.28 32.28 39.65 39.65 39.45   Foot/eye exam completion dates Latest Ref Rng & Units 05/05/2018 01/26/2018  Eye Exam No Retinopathy - No Retinopathy  Foot exam Order - - -  Foot Form Completion - Done -

## 2019-08-26 NOTE — Progress Notes (Signed)
Tammy Suarez     MRN: [id number]      DOB: 01-22-1946   HPI Tammy Suarez is here for follow up and re-evaluation of chronic medical conditions, medication management and review of any available recent lab and radiology data.  Preventive health is updated, specifically  Cancer screening and Immunization.   Questions or concerns regarding consultations or procedures which the PT has had in the interim are  addressed. The PT denies any adverse reactions to current medications since the last visit.  There are no new concerns.  Chronic depression due to family stress , unchanged, sheis not suicidal or homicidal and has no interest in therapy  ROS Denies recent fever or chills. Denies sinus pressure, nasal congestion, ear pain or sore throat. Denies chest congestion, productive cough or wheezing. Denies chest pains, palpitations and leg swelling Denies abdominal pain, nausea, vomiting,diarrhea or constipation.   Denies dysuria, frequency, c/o worsening  incontinence. C/o chronic joint pain, swelling and limitation in mobility. Denies headaches, seizures, numbness, or tingling. C/o  depression, anxiety or insomnia. Denies skin break down or rash.   PE  BP 134/78   Pulse 67   Temp (!) 97.4 F (36.3 C) (Temporal)   Resp 15   Ht 5\' 5"  (1.651 m)   Wt 203 lb 6.4 oz (92.3 kg)   SpO2 98%   BMI 33.85 kg/m   Patient alert and oriented and in no cardiopulmonary distress.  HEENT: No facial asymmetry, EOMI,     Neck decreased ROM.  Chest: Clear to auscultation bilaterally.  CVS: S1, S2 no murmurs, no S3.Regular rate.  ABD: Soft non tender.   Ext: No edema  MS: Decreased  ROM spine, shoulders, hips and knees.  Skin: Intact, no ulcerations or rash noted.  Psych: Good eye contact, normal affect. Memory intact  anxious , teaful at times and  depressed appearing.  CNS: CN 2-12 intact, power,  normal throughout.no focal deficits noted.   Assessment & Plan Essential hypertension,  benign Controlled, no change in medication DASH diet and commitment to daily physical activity for a minimum of 30 minutes discussed and encouraged, as a part of hypertension management. The importance of attaining a healthy weight is also discussed.  BP/Weight 08/24/2019 07/19/2019 05/18/2019 05/03/2019 05/02/2019 04/25/2019 01/14/6009  Systolic BP 932 - 355 732 202 542 706  Diastolic BP 78 - 68 62 78 78 67  Wt. (Lbs) 203.4 201 194 194 203 203 202  BMI 33.85 33.45 32.28 32.28 39.65 39.65 39.45       DM (diabetes mellitus), type 2 with renal complications ([place]) Tammy Suarez is reminded of the importance of commitment to daily physical activity for 30 minutes or more, as able and the need to limit carbohydrate intake to 30 to 60 grams per meal to help with blood sugar control.   The need to take medication as prescribed, test blood sugar as directed, and to call between visits if there is a concern that blood sugar is uncontrolled is also discussed.   Tammy Suarez is reminded of the importance of daily foot exam, annual eye examination, and good blood sugar, blood pressure and cholesterol control Controlled, no change in medication .  Diabetic Labs Latest Ref Rng & Units 06/08/2019 05/17/2019 12/22/2018 07/26/2018 04/01/2018  HbA1c <5.7 % of total Hgb - 6.2(H) 7.8(H) 7.0(H) 6.9(H)  Microalbumin mg/dL - - - - -  Micro/Creat Ratio <30 mcg/mg creat - - - - -  Chol <200 mg/dL - 104 - -  110  HDL > OR = 50 mg/dL - 19(L) - - 27(L)  Calc LDL mg/dL (calc) - 51 - - 51  Triglycerides <150 mg/dL - 288(H) - - 269(H)  Creatinine 0.60 - 0.93 mg/dL 1.53(H) 1.90(H) 1.35(H) 1.36(H) 1.37(H)   BP/Weight 08/24/2019 07/19/2019 05/18/2019 05/03/2019 05/02/2019 04/25/2019 08/14/902  Systolic BP 014 - 996 924 932 419 914  Diastolic BP 78 - 68 62 78 78 67  Wt. (Lbs) 203.4 201 194 194 203 203 202  BMI 33.85 33.45 32.28 32.28 39.65 39.65 39.45   Foot/eye exam completion dates Latest Ref Rng & Units 05/05/2018 01/26/2018  Eye Exam No  Retinopathy - No Retinopathy  Foot exam Order - - -  Foot Form Completion - Done -        Hyperlipidemia with target LDL less than 70 Hyperlipidemia:Low fat diet discussed and encouraged.   Lipid Panel  Lab Results  Component Value Date   CHOL 104 05/17/2019   HDL 19 (L) 05/17/2019   LDLCALC 51 05/17/2019   TRIG 288 (H) 05/17/2019   CHOLHDL 5.5 (H) 05/17/2019   Needs to reduce fat intake   Urinary incontinence Uncontrolled and worsening, start vesicare

## 2019-08-26 NOTE — Assessment & Plan Note (Signed)
Uncontrolled and worsening, start vesicare

## 2019-08-28 ENCOUNTER — Other Ambulatory Visit: Payer: Self-pay

## 2019-08-28 ENCOUNTER — Other Ambulatory Visit: Payer: Self-pay | Admitting: Family Medicine

## 2019-08-28 MED ORDER — SOLIFENACIN SUCCINATE 5 MG PO TABS: 5.0000 mg | ORAL_TABLET | Freq: Every day | ORAL | 3 refills | Status: DC

## 2019-08-28 MED ORDER — OXYBUTYNIN CHLORIDE ER 5 MG PO TB24: 5.0000 mg | ORAL_TABLET | Freq: Every day | ORAL | 3 refills | Status: DC

## 2019-09-06 ENCOUNTER — Other Ambulatory Visit: Payer: Self-pay

## 2019-09-06 ENCOUNTER — Ambulatory Visit (HOSPITAL_COMMUNITY)
Admission: RE | Admit: 2019-09-06 | Discharge: 2019-09-06 | Disposition: A | Discharge status: Home / Self Care | Payer: Medicare HMO | Source: Ambulatory Visit | Attending: Family Medicine | Admitting: Family Medicine

## 2019-09-06 DIAGNOSIS — Z1231 Encounter for screening mammogram for malignant neoplasm of breast: Secondary | ICD-10-CM | POA: Diagnosis present

## 2019-09-06 DIAGNOSIS — Z780 Asymptomatic menopausal state: Secondary | ICD-10-CM | POA: Diagnosis present

## 2019-09-07 ENCOUNTER — Ambulatory Visit
Admission: EM | Admit: 2019-09-07 | Discharge: 2019-09-07 | Disposition: A | Discharge status: Home / Self Care | Payer: Medicare HMO | Attending: Emergency Medicine | Admitting: Emergency Medicine

## 2019-09-07 ENCOUNTER — Emergency Department (HOSPITAL_COMMUNITY)
Admission: EM | Admit: 2019-09-07 | Discharge: 2019-09-07 | Disposition: A | Discharge status: Home / Self Care | Payer: Medicare HMO | Attending: Emergency Medicine | Admitting: Emergency Medicine

## 2019-09-07 ENCOUNTER — Other Ambulatory Visit: Payer: Self-pay

## 2019-09-07 ENCOUNTER — Ambulatory Visit (INDEPENDENT_AMBULATORY_CARE_PROVIDER_SITE_OTHER): Payer: Medicare HMO

## 2019-09-07 ENCOUNTER — Encounter (HOSPITAL_COMMUNITY): Payer: Self-pay

## 2019-09-07 DIAGNOSIS — Z5321 Procedure and treatment not carried out due to patient leaving prior to being seen by health care provider: Secondary | ICD-10-CM | POA: Insufficient documentation

## 2019-09-07 DIAGNOSIS — Y939 Activity, unspecified: Secondary | ICD-10-CM | POA: Insufficient documentation

## 2019-09-07 DIAGNOSIS — S40022A Contusion of left upper arm, initial encounter: Secondary | ICD-10-CM | POA: Insufficient documentation

## 2019-09-07 DIAGNOSIS — Y929 Unspecified place or not applicable: Secondary | ICD-10-CM | POA: Insufficient documentation

## 2019-09-07 DIAGNOSIS — M79622 Pain in left upper arm: Secondary | ICD-10-CM

## 2019-09-07 DIAGNOSIS — X58XXXA Exposure to other specified factors, initial encounter: Secondary | ICD-10-CM | POA: Insufficient documentation

## 2019-09-07 DIAGNOSIS — S42214D Unspecified nondisplaced fracture of surgical neck of right humerus, subsequent encounter for fracture with routine healing: Secondary | ICD-10-CM

## 2019-09-07 DIAGNOSIS — M25512 Pain in left shoulder: Secondary | ICD-10-CM | POA: Diagnosis not present

## 2019-09-07 DIAGNOSIS — Y998 Other external cause status: Secondary | ICD-10-CM | POA: Diagnosis not present

## 2019-09-07 NOTE — Discharge Instructions (Signed)
LT arm x-ray negative for fracture RT arm x-ray with healing impacted RT humeral neck fracture Continue conservative management of rest, ice, and elevation Use OTC medications as needed for pain and inflammation Follow up with orthopedist for further evaluation and management Return or go to the ER if you have any new or worsening symptoms (fever, chills, chest pain, increased redness, increased swelling, worsening symptoms, etc...)

## 2019-09-07 NOTE — ED Provider Notes (Signed)
[place]   [id number] 09/07/19 Arrival Time: 8416  CC: Left arm bruise  SUBJECTIVE: History from: patient. Tammy Suarez is a 73 y.o. female complains of left arm pain and bruising that began 1 day ago.  Denies a precipitating event or specific injury.  Localizes the pain to the left upper arm and shoulder.  Describes the pain as intermittent, sharp, and achy in character.  Has tried OTC medications with relief.  Symptoms are made worse with reaching.  Reports RT humeral head fracture 6 weeks ago.  Complains of associated swelling, bruising, and weakness.  Also mentions numbness and tingling to bilateral UE, this is chronic and stable.  Denies fever, chills, erythema, weakness.    Request RT humerus xray.    ROS: As per HPI.  All other pertinent ROS negative.     Past Medical History:  Diagnosis Date  . Arthritis   . Cancer (HCC)    Uterine  . Carpal tunnel syndrome    bilaterally  . Chronic back pain   . CKD (chronic kidney disease) stage 3, GFR 30-59 ml/min   . Coronary atherosclerosis of native coronary artery    Multivessel status post CABG - LIMA to LAD, SVG to OM and SVG to PDA  . Depression   . Diabetes mellitus without complication ([place])   . Essential hypertension, benign   . GERD (gastroesophageal reflux disease)   . Headache   . History of conjunctivitis   . History of hiatal hernia   . Hyperlipidemia   . Hypothyroidism   . Myocardial infarction ([place])   . Obesity   . Vitamin B 12 deficiency    Past Surgical History:  Procedure Laterality Date  . ABDOMINAL HYSTERECTOMY    . BACK SURGERY    . CARDIAC SURGERY    . CARPAL TUNNEL RELEASE Left 07/23/2016   Procedure: CARPAL TUNNEL RELEASE;  Surgeon: Carole Civil, MD;  Location: [facility];  Service: Orthopedics;  Laterality: Left;  . CERVICAL LAMINECTOMY    . CHOLECYSTECTOMY    . COLONOSCOPY  2007   Dr. Irving Shows: reported to be normal  . CORONARY ARTERY BYPASS GRAFT N/A 11/02/2013   Procedure:  CORONARY ARTERY BYPASS GRAFTING (CABG);  Surgeon: Gaye Pollack, MD;  Location: [facility];  Service: Open Heart Surgery;  Laterality: N/A;  CABG x three, using left internal mammary artery and right leg greater saphenous vein harvested endoscopically  . COSMETIC SURGERY  07/01/2012   Recurrent skin infection on lower abdomen, [facility]  . HERNIA REPAIR    . Inguinal herniorrhaphy    . INTRAOPERATIVE TRANSESOPHAGEAL ECHOCARDIOGRAM N/A 11/02/2013   Procedure: INTRAOPERATIVE TRANSESOPHAGEAL ECHOCARDIOGRAM;  Surgeon: Gaye Pollack, MD;  Location: [facility] OR;  Service: Open Heart Surgery;  Laterality: N/A;  . LAPAROSCOPIC GASTRIC BANDING  2009  . LEFT HEART CATHETERIZATION WITH CORONARY ANGIOGRAM N/A 10/30/2013   Procedure: LEFT HEART CATHETERIZATION WITH CORONARY ANGIOGRAM;  Surgeon: Minus Breeding, MD;  Location: [facility] CATH LAB;  Service: Cardiovascular;  Laterality: N/A;  . Left index finger repair    . LUMBAR FUSION    . SPINE SURGERY    . VESICOVAGINAL FISTULA CLOSURE W/ TAH     Allergies  Allergen Reactions  . Gabapentin Other (See Comments)    Feels dizzy  . Meperidine Hcl Nausea And Vomiting  . Niacin Other (See Comments)    REACTION: face peeling and burning  . Propoxyphene N-Acetaminophen Nausea And Vomiting  . Hydrocodone-Acetaminophen Nausea And Vomiting   No current  facility-administered medications on file prior to encounter.    Current Outpatient Medications on File Prior to Encounter  Medication Sig Dispense Refill  . ACCU-CHEK AVIVA PLUS test strip AS DIRECTED 300 each 2  . ACCU-CHEK FASTCLIX LANCETS MISC AS DIRECTED 306 each 3  . alendronate (FOSAMAX) 70 MG tablet Take 1 tablet (70 mg total) by mouth every 7 (seven) days. Take with a full glass of water on an empty stomach. 4 tablet 11  . aspirin EC 81 MG tablet Take 81 mg by mouth daily.    . Aspirin-Acetaminophen-Caffeine (GOODY HEADACHE PO) Take 1 Package by mouth 2 (two) times daily as needed (headache).    . blood glucose meter  kit and supplies Dispense based on patient and insurance preference. Once daily testing dx e11.9 1 each 0  . clonazePAM (KLONOPIN) 1 MG tablet Take one tablet by mouth, three times daily for anxiety 90 tablet 2  . clonazePAM (KLONOPIN) 1 MG tablet Take one tablet by mouth three times daily for anxiety 90 tablet 3  . clonazePAM (KLONOPIN) 1 MG tablet Take one tablet three times daily for anxiety, by mouth 90 tablet 5  . DULoxetine (CYMBALTA) 60 MG capsule TAKE 1 CAPSULE BY MOUTH TWICE DAILY 180 capsule 2  . famotidine (PEPCID) 20 MG tablet Take 1 tablet (20 mg total) by mouth 2 (two) times daily. 60 tablet 5  . fenofibrate (TRICOR) 145 MG tablet TAKE 1 TABLET BY MOUTH EVERY DAY 90 tablet 2  . FLUoxetine (PROZAC) 20 MG capsule Take 1 capsule (20 mg total) by mouth daily. 30 capsule 3  . glipiZIDE (GLUCOTROL XL) 2.5 MG 24 hr tablet TAKE 1 TABLET(2.5 MG) BY MOUTH DAILY WITH BREAKFAST 90 tablet 1  . hydrochlorothiazide (MICROZIDE) 12.5 MG capsule TAKE 1 CAPSULE(12.5 MG) BY MOUTH DAILY 90 capsule 0  . isosorbide mononitrate (IMDUR) 30 MG 24 hr tablet Take 1 tablet (30 mg total) by mouth at bedtime. 90 tablet 3  . levothyroxine (SYNTHROID) 100 MCG tablet TAKE 1 TABLET BY MOUTH EVERY DAY 90 tablet 0  . levothyroxine (SYNTHROID) 112 MCG tablet Take 1 tablet (112 mcg total) by mouth daily. 90 tablet 1  . losartan (COZAAR) 50 MG tablet TAKE 1 TABLET(50 MG) BY MOUTH DAILY 90 tablet 1  . metoprolol succinate (TOPROL-XL) 25 MG 24 hr tablet TAKE 1 TABLET BY MOUTH EVERY DAY 90 tablet 2  . Multiple Vitamin (MULTIVITAMIN) tablet Take 1 tablet by mouth daily.    . nitroGLYCERIN (NITROSTAT) 0.4 MG SL tablet DISSOLVE 1 TABLET UNDER THE TONGUE EVERY 5 MINUTES AS NEEDED FOR CHEST PAIN 25 tablet 3  . Omega-3 Fatty Acids (FISH OIL PO) Take 1 capsule by mouth daily.    Marland Kitchen oxybutynin (DITROPAN-XL) 5 MG 24 hr tablet Take 1 tablet (5 mg total) by mouth at bedtime. 30 tablet 3  . oxyCODONE-acetaminophen (PERCOCET) 10-325 MG  tablet TK 1 T PO  Q 6 H PRF CHRONIC PAIN  0  . rosuvastatin (CRESTOR) 40 MG tablet Take 1 tablet (40 mg total) by mouth daily. 90 tablet 1   Social History   Socioeconomic History  . Marital status: Single    Spouse name: Not on file  . Number of children: 3  . Years of education: Not on file  . Highest education level: GED or equivalent  Occupational History  . Not on file  Social Needs  . Financial resource strain: Very hard  . Food insecurity    Worry: Often true    Inability:  Sometimes true  . Transportation needs    Medical: Yes    Non-medical: Yes  Tobacco Use  . Smoking status: Current Every Day Smoker    Packs/day: 0.25    Years: 50.00    Pack years: 12.50    Types: Cigarettes  . Smokeless tobacco: Never Used  . Tobacco comment: smokes about 10 a day   Substance and Sexual Activity  . Alcohol use: No    Alcohol/week: 0.0 standard drinks  . Drug use: No  . Sexual activity: Never    Birth control/protection: Surgical  Lifestyle  . Physical activity    Days per week: 0 days    Minutes per session: 0 min  . Stress: Very much  Relationships  . Social Herbalist on phone: Twice a week    Gets together: More than three times a week    Attends religious service: Not on file    Active member of club or organization: Yes    Attends meetings of clubs or organizations: 1 to 4 times per year    Relationship status: Separated  . Intimate partner violence    Fear of current or ex partner: No    Emotionally abused: No    Physically abused: No    Forced sexual activity: No  Other Topics Concern  . Not on file  Social History Narrative  . Not on file   Family History  Problem Relation Age of Onset  . Heart attack Mother   . Heart attack Father   . Hypertension Sister   . Heart attack Sister   . Cancer Brother        Renal cell   . Hepatitis Daughter   . Drug abuse Daughter   . Hypertension Daughter     OBJECTIVE:  Vitals:   09/07/19 1631  BP:  (!) 168/76  Pulse: 70  Resp: 16  Temp: 98.8 F (37.1 C)  TempSrc: Oral  SpO2: 95%    General appearance: ALERT; in no acute distress.  Head: NCAT Lungs: Normal respiratory effort CV: Radial pulses 2+ bilaterally. Cap refill < 2 seconds Musculoskeletal: Left arm Inspection: significant ecchymosis to anterior upper arm (see picture below) Palpation: TTP over left biceps and posterior shoulder ROM: LROM about the shoulder Strength:+ /5 shld abduction, 4+/5 shld adduction, 5/5 elbow flexion, 5/5 elbow extension, 5/5 grip strength, Skin: warm and dry Neurologic: Ambulates without difficulty; Sensation intact about the upper extremities Psychological: alert and cooperative; normal mood and affect      DIAGNOSTIC STUDIES:  Dg Humerus Left  Result Date: 09/07/2019 CLINICAL DATA:  Bruising of the LEFT humerus. No recent injury. RIGHT humeral fracture more than 6 weeks ago. EXAM: LEFT HUMERUS - 2+ VIEW COMPARISON:  None. FINDINGS: There is no evidence of fracture or other focal bone lesions. Soft tissues are unremarkable. IMPRESSION: Negative. Electronically Signed   By: Nolon Nations M.D.   On: 09/07/2019 17:20   Dg Humerus Right  Result Date: 09/07/2019 CLINICAL DATA:  Patient states that she has pain in bilateral humerus. Hx of rt humerus fracture more than 6 weeks ago. Left humerus began to be painful within the last week, NKI. Brusing along left humeral head EXAM: RIGHT HUMERUS - 2+ VIEW COMPARISON:  05/18/2019 FINDINGS: Impacted RIGHT humeral neck fracture, unchanged in alignment. Callus formation is identified consistent with healing. Subacromial narrowing likely accentuated by patient position. IMPRESSION: Healing impacted RIGHT humeral neck fracture. Electronically Signed   By: Nolon Nations M.D.  On: 09/07/2019 17:19    LT arm x-rays negative for bony abnormalities including fracture, or dislocation.  No soft tissue swelling.    I have reviewed the x-rays myself and the  radiologist interpretation. I am in agreement with the radiologist interpretation.     ASSESSMENT & PLAN:  1. Hematoma of arm, left, initial encounter   2. Closed nondisplaced fracture of surgical neck of right humerus with routine healing, unspecified fracture morphology, subsequent encounter    Left arm symptoms could be related to muscle strain or tear. Instructed patient to continue with conservative management and follow up with ortho.    LT arm x-ray negative for fracture RT arm x-ray with healing impacted RT humeral neck fracture Continue conservative management of rest, ice, and elevation Use OTC medications as needed for pain and inflammation Follow up with orthopedist for further evaluation and management Return or go to the ER if you have any new or worsening symptoms (fever, chills, chest pain, increased redness, increased swelling, worsening symptoms, etc...)   Reviewed expectations re: course of current medical issues. Questions answered. Outlined signs and symptoms indicating need for more acute intervention. Patient verbalized understanding. After Visit Summary given.    Lestine Box, PA-C 09/07/19 1749

## 2019-09-07 NOTE — ED Triage Notes (Signed)
Pt presents with severe bruising and swelling to left ar that occurred spontaneously yesterday, pt also states she had a fracture of right upper arm last month that she has seen ortho for but states she hears popping in arm .

## 2019-09-07 NOTE — ED Triage Notes (Signed)
Pt has extensive bruising and swelling to left arm. Has no idea what happened to arm or when it happened. Denies any use of blood thinners. Complaining of left shoulder pain.

## 2019-09-20 LAB — CBC
HCT: 30.5 % — ABNORMAL LOW (ref 35.0–45.0)
Hemoglobin: 10.3 g/dL — ABNORMAL LOW (ref 11.7–15.5)
MCH: 33.1 pg — ABNORMAL HIGH (ref 27.0–33.0)
MCHC: 33.8 g/dL (ref 32.0–36.0)
MCV: 98.1 fL (ref 80.0–100.0)
MPV: 11.5 fL (ref 7.5–12.5)
Platelets: 240 10*3/uL (ref 140–400)
RBC: 3.11 10*6/uL — ABNORMAL LOW (ref 3.80–5.10)
RDW: 11.7 % (ref 11.0–15.0)
WBC: 4.4 10*3/uL (ref 3.8–10.8)

## 2019-09-20 LAB — COMPLETE METABOLIC PANEL WITH GFR
AG Ratio: 1.3 (calc) (ref 1.0–2.5)
ALT: 10 U/L (ref 6–29)
AST: 24 U/L (ref 10–35)
Albumin: 3.6 g/dL (ref 3.6–5.1)
Alkaline phosphatase (APISO): 59 U/L (ref 37–153)
BUN/Creatinine Ratio: 22 (calc) (ref 6–22)
BUN: 51 mg/dL — ABNORMAL HIGH (ref 7–25)
CO2: 27 mmol/L (ref 20–32)
Calcium: 10.1 mg/dL (ref 8.6–10.4)
Chloride: 102 mmol/L (ref 98–110)
Creat: 2.35 mg/dL — ABNORMAL HIGH (ref 0.60–0.93)
GFR, Est African American: 23 mL/min/{1.73_m2} — ABNORMAL LOW (ref >=60)
GFR, Est Non African American: 20 mL/min/{1.73_m2} — ABNORMAL LOW (ref >=60)
Globulin: 2.7 g/dL (calc) (ref 1.9–3.7)
Glucose, Bld: 147 mg/dL — ABNORMAL HIGH (ref 65–99)
Potassium: 5.2 mmol/L (ref 3.5–5.3)
Sodium: 138 mmol/L (ref 135–146)
Total Bilirubin: 0.5 mg/dL (ref 0.2–1.2)
Total Protein: 6.3 g/dL (ref 6.1–8.1)

## 2019-09-20 LAB — LIPID PANEL
Cholesterol: 95 mg/dL (ref <200)
HDL: 16 mg/dL — ABNORMAL LOW (ref >=50)
LDL Cholesterol (Calc): 50 mg/dL (calc)
Non-HDL Cholesterol (Calc): 79 mg/dL (calc) (ref <130)
Total CHOL/HDL Ratio: 5.9 (calc) — ABNORMAL HIGH (ref <5.0)
Triglycerides: 230 mg/dL — ABNORMAL HIGH (ref <150)

## 2019-09-20 LAB — TSH: TSH: 0.55 mIU/L (ref 0.40–4.50)

## 2019-09-25 ENCOUNTER — Ambulatory Visit
Admission: EM | Admit: 2019-09-25 | Discharge: 2019-09-25 | Disposition: A | Discharge status: Home / Self Care | Payer: Medicare HMO | Attending: Emergency Medicine | Admitting: Emergency Medicine

## 2019-09-25 ENCOUNTER — Encounter: Payer: Medicare HMO | Admitting: Family Medicine

## 2019-09-25 DIAGNOSIS — R109 Unspecified abdominal pain: Secondary | ICD-10-CM | POA: Diagnosis present

## 2019-09-25 DIAGNOSIS — N3000 Acute cystitis without hematuria: Secondary | ICD-10-CM | POA: Diagnosis present

## 2019-09-25 LAB — POCT URINALYSIS DIP (MANUAL ENTRY)
Bilirubin, UA: NEGATIVE
Blood, UA: NEGATIVE
Glucose, UA: NEGATIVE mg/dL
Ketones, POC UA: NEGATIVE mg/dL
Nitrite, UA: NEGATIVE
Protein Ur, POC: 100 mg/dL — AB
Spec Grav, UA: 1.02 (ref 1.010–1.025)
Urobilinogen, UA: 0.2 E.U./dL
pH, UA: 5 (ref 5.0–8.0)

## 2019-09-25 MED ORDER — PHENAZOPYRIDINE HCL 200 MG PO TABS: 200.0000 mg | ORAL_TABLET | Freq: Three times a day (TID) | ORAL | 0 refills | Status: DC

## 2019-09-25 MED ORDER — CEPHALEXIN 500 MG PO CAPS: 500.0000 mg | ORAL_CAPSULE | Freq: Two times a day (BID) | ORAL | 0 refills | Status: AC

## 2019-09-25 NOTE — ED Provider Notes (Signed)
[facility]   CC: RT flank pain and strong urine odor  SUBJECTIVE:  Tammy Suarez is a 73 y.o. female who complains of RT flank pain, and strong urine odor x 4 days.  Admits to recent medication change for nocturnal urinary incontinence, urgency, and frequency that she started prior to symptoms.  Localizes the pain to the RT flank.  Pain is intermittent and describes it as burning, stinging, and achy in character.  Has NOT tried OTC medications.  Symptoms are made worse with movement, however, currently she is asymptomatic.  Denies similar symptoms in the past.  Tried to get in to see PCP today, but was unable to.  Denies fever, chills, nausea, vomiting, SOB, CP, abdominal pain, abnormal vaginal discharge or bleeding, hematuria, constipation, diarrhea, hematochezia, melena.    Last BM yesterday and normal for patient  LMP: No LMP recorded. Patient has had a hysterectomy.  ROS: As in HPI.  All other pertinent ROS negative.     Past Medical History:  Diagnosis Date  . Arthritis   . Cancer (HCC)    Uterine  . Carpal tunnel syndrome    bilaterally  . Chronic back pain   . CKD (chronic kidney disease) stage 3, GFR 30-59 ml/min   . Coronary atherosclerosis of native coronary artery    Multivessel status post CABG - LIMA to LAD, SVG to OM and SVG to PDA  . Depression   . Diabetes mellitus without complication ([place])   . Essential hypertension, benign   . GERD (gastroesophageal reflux disease)   . Headache   . History of conjunctivitis   . History of hiatal hernia   . Hyperlipidemia   . Hypothyroidism   . Myocardial infarction ([place])   . Obesity   . Vitamin B 12 deficiency    Past Surgical History:  Procedure Laterality Date  . ABDOMINAL HYSTERECTOMY    . BACK SURGERY    . CARDIAC SURGERY    . CARPAL TUNNEL RELEASE Left 07/23/2016   Procedure: CARPAL TUNNEL RELEASE;  Surgeon: Carole Civil, MD;  Location: [facility];  Service: Orthopedics;  Laterality: Left;  .  CERVICAL LAMINECTOMY    . CHOLECYSTECTOMY    . COLONOSCOPY  2007   Dr. Irving Shows: reported to be normal  . CORONARY ARTERY BYPASS GRAFT N/A 11/02/2013   Procedure: CORONARY ARTERY BYPASS GRAFTING (CABG);  Surgeon: Gaye Pollack, MD;  Location: [facility];  Service: Open Heart Surgery;  Laterality: N/A;  CABG x three, using left internal mammary artery and right leg greater saphenous vein harvested endoscopically  . COSMETIC SURGERY  07/01/2012   Recurrent skin infection on lower abdomen, [facility]  . HERNIA REPAIR    . Inguinal herniorrhaphy    . INTRAOPERATIVE TRANSESOPHAGEAL ECHOCARDIOGRAM N/A 11/02/2013   Procedure: INTRAOPERATIVE TRANSESOPHAGEAL ECHOCARDIOGRAM;  Surgeon: Gaye Pollack, MD;  Location: [facility] OR;  Service: Open Heart Surgery;  Laterality: N/A;  . LAPAROSCOPIC GASTRIC BANDING  2009  . LEFT HEART CATHETERIZATION WITH CORONARY ANGIOGRAM N/A 10/30/2013   Procedure: LEFT HEART CATHETERIZATION WITH CORONARY ANGIOGRAM;  Surgeon: Minus Breeding, MD;  Location: [facility] CATH LAB;  Service: Cardiovascular;  Laterality: N/A;  . Left index finger repair    . LUMBAR FUSION    . SPINE SURGERY    . VESICOVAGINAL FISTULA CLOSURE W/ TAH     Allergies  Allergen Reactions  . Gabapentin Other (See Comments)    Feels dizzy  . Meperidine Hcl Nausea And Vomiting  . Niacin Other (See  Comments)    REACTION: face peeling and burning  . Propoxyphene N-Acetaminophen Nausea And Vomiting  . Hydrocodone-Acetaminophen Nausea And Vomiting   No current facility-administered medications on file prior to encounter.    Current Outpatient Medications on File Prior to Encounter  Medication Sig Dispense Refill  . ACCU-CHEK AVIVA PLUS test strip AS DIRECTED 300 each 2  . ACCU-CHEK FASTCLIX LANCETS MISC AS DIRECTED 306 each 3  . alendronate (FOSAMAX) 70 MG tablet Take 1 tablet (70 mg total) by mouth every 7 (seven) days. Take with a full glass of water on an empty stomach. 4 tablet 11  . aspirin EC 81 MG tablet Take  81 mg by mouth daily.    . Aspirin-Acetaminophen-Caffeine (GOODY HEADACHE PO) Take 1 Package by mouth 2 (two) times daily as needed (headache).    . blood glucose meter kit and supplies Dispense based on patient and insurance preference. Once daily testing dx e11.9 1 each 0  . clonazePAM (KLONOPIN) 1 MG tablet Take one tablet by mouth, three times daily for anxiety 90 tablet 2  . clonazePAM (KLONOPIN) 1 MG tablet Take one tablet by mouth three times daily for anxiety 90 tablet 3  . clonazePAM (KLONOPIN) 1 MG tablet Take one tablet three times daily for anxiety, by mouth 90 tablet 5  . DULoxetine (CYMBALTA) 60 MG capsule TAKE 1 CAPSULE BY MOUTH TWICE DAILY 180 capsule 2  . famotidine (PEPCID) 20 MG tablet Take 1 tablet (20 mg total) by mouth 2 (two) times daily. 60 tablet 5  . fenofibrate (TRICOR) 145 MG tablet TAKE 1 TABLET BY MOUTH EVERY DAY 90 tablet 2  . FLUoxetine (PROZAC) 20 MG capsule Take 1 capsule (20 mg total) by mouth daily. 30 capsule 3  . glipiZIDE (GLUCOTROL XL) 2.5 MG 24 hr tablet TAKE 1 TABLET(2.5 MG) BY MOUTH DAILY WITH BREAKFAST 90 tablet 1  . hydrochlorothiazide (MICROZIDE) 12.5 MG capsule TAKE 1 CAPSULE(12.5 MG) BY MOUTH DAILY 90 capsule 0  . isosorbide mononitrate (IMDUR) 30 MG 24 hr tablet Take 1 tablet (30 mg total) by mouth at bedtime. 90 tablet 3  . levothyroxine (SYNTHROID) 100 MCG tablet TAKE 1 TABLET BY MOUTH EVERY DAY 90 tablet 0  . levothyroxine (SYNTHROID) 112 MCG tablet Take 1 tablet (112 mcg total) by mouth daily. 90 tablet 1  . losartan (COZAAR) 50 MG tablet TAKE 1 TABLET(50 MG) BY MOUTH DAILY 90 tablet 1  . metoprolol succinate (TOPROL-XL) 25 MG 24 hr tablet TAKE 1 TABLET BY MOUTH EVERY DAY 90 tablet 2  . Multiple Vitamin (MULTIVITAMIN) tablet Take 1 tablet by mouth daily.    . nitroGLYCERIN (NITROSTAT) 0.4 MG SL tablet DISSOLVE 1 TABLET UNDER THE TONGUE EVERY 5 MINUTES AS NEEDED FOR CHEST PAIN 25 tablet 3  . Omega-3 Fatty Acids (FISH OIL PO) Take 1 capsule by  mouth daily.    Marland Kitchen oxybutynin (DITROPAN-XL) 5 MG 24 hr tablet Take 1 tablet (5 mg total) by mouth at bedtime. 30 tablet 3  . oxyCODONE-acetaminophen (PERCOCET) 10-325 MG tablet TK 1 T PO  Q 6 H PRF CHRONIC PAIN  0  . rosuvastatin (CRESTOR) 40 MG tablet Take 1 tablet (40 mg total) by mouth daily. 90 tablet 1   Social History   Socioeconomic History  . Marital status: Single    Spouse name: Not on file  . Number of children: 3  . Years of education: Not on file  . Highest education level: GED or equivalent  Occupational History  . Not  on file  Social Needs  . Financial resource strain: Very hard  . Food insecurity    Worry: Often true    Inability: Sometimes true  . Transportation needs    Medical: Yes    Non-medical: Yes  Tobacco Use  . Smoking status: Current Every Day Smoker    Packs/day: 0.25    Years: 50.00    Pack years: 12.50    Types: Cigarettes  . Smokeless tobacco: Never Used  . Tobacco comment: smokes about 10 a day   Substance and Sexual Activity  . Alcohol use: No    Alcohol/week: 0.0 standard drinks  . Drug use: No  . Sexual activity: Never    Birth control/protection: Surgical  Lifestyle  . Physical activity    Days per week: 0 days    Minutes per session: 0 min  . Stress: Very much  Relationships  . Social Herbalist on phone: Twice a week    Gets together: More than three times a week    Attends religious service: Not on file    Active member of club or organization: Yes    Attends meetings of clubs or organizations: 1 to 4 times per year    Relationship status: Separated  . Intimate partner violence    Fear of current or ex partner: No    Emotionally abused: No    Physically abused: No    Forced sexual activity: No  Other Topics Concern  . Not on file  Social History Narrative  . Not on file   Family History  Problem Relation Age of Onset  . Heart attack Mother   . Heart attack Father   . Hypertension Sister   . Heart attack  Sister   . Cancer Brother        Renal cell   . Hepatitis Daughter   . Drug abuse Daughter   . Hypertension Daughter     OBJECTIVE:  Vitals:   09/25/19 1142  BP: (!) 143/74  Pulse: 79  Resp: 17  Temp: 98.4 F (36.9 C)  TempSrc: Oral  SpO2: 96%   General appearance: Alert; no acute distress HEENT: NCAT.  PERRL, EOMI grossly; Oropharynx clear.  Lungs: clear to auscultation bilaterally without adventitious breath sounds Heart: regular rate and rhythm.   Abdomen: multiple scars post gallbladder removal, and lap band surgery; soft; non-distended; no tenderness; bowel sounds present; no guarding Back: no CVA tenderness Extremities: no edema; symmetrical with no gross deformities Skin: warm and dry Neurologic: Ambulates from chair to exam table without difficulty Psychological: alert and cooperative; normal mood and affect  Labs Reviewed  POCT URINALYSIS DIP (MANUAL ENTRY) - Abnormal; Notable for the following components:      Result Value   Protein Ur, POC =100 (*)    Leukocytes, UA Moderate (2+) (*)    All other components within normal limits  URINE CULTURE    ASSESSMENT & PLAN:  1. Acute cystitis without hematuria   2. Right flank pain     Meds ordered this encounter  Medications  . cephALEXin (KEFLEX) 500 MG capsule    Sig: Take 1 capsule (500 mg total) by mouth 2 (two) times daily for 7 days.    Dispense:  14 capsule    Refill:  0    Order Specific Question:   Supervising Provider    Answer:   Raylene Everts [[id number]]  . phenazopyridine (PYRIDIUM) 200 MG tablet    Sig: Take 1 tablet (  200 mg total) by mouth 3 (three) times daily.    Dispense:  6 tablet    Refill:  0    Order Specific Question:   Supervising Provider    Answer:   Raylene Everts [[id number]]   Urine concerning for infection Urine culture sent.  We will call you with abnormal results.   Push fluids and get plenty of rest.   Take antibiotic as directed and to completion Follow up with  PCP for further evaluation and management.  This infection may be secondary to starting your new medication.  Return here or go to ER if you have any new or worsening symptoms such as fever, abdominal pain, nausea/vomiting, worsening flank pain, changes in bowel habits, no improvement in symptoms despite medication, etc...  Pyridium sent in to pharmacy on file to help with burning, and stinging pain  Outlined signs and symptoms indicating need for more acute intervention. Patient verbalized understanding. After Visit Summary given.     Lestine Box, PA-C 09/25/19 1233

## 2019-09-25 NOTE — Discharge Instructions (Signed)
Urine concerning for infection Urine culture sent.  We will call you with abnormal results.   Push fluids and get plenty of rest.   Take antibiotic as directed and to completion Follow up with PCP for further evaluation and management.  This infection may be secondary to starting your new medication.  Return here or go to ER if you have any new or worsening symptoms such as fever, abdominal pain, nausea/vomiting, worsening flank pain, changes in bowel habits, no improvement in symptoms despite medication, etc..Marland Kitchen

## 2019-09-25 NOTE — ED Triage Notes (Signed)
Pt presents with lower abdominal and flank pain  That began last week

## 2019-09-26 ENCOUNTER — Other Ambulatory Visit: Payer: Self-pay | Admitting: *Deleted

## 2019-09-26 LAB — URINE CULTURE

## 2019-09-26 MED ORDER — ISOSORBIDE MONONITRATE ER 30 MG PO TB24: 30.0000 mg | ORAL_TABLET | Freq: Every day | ORAL | 0 refills | Status: DC

## 2019-09-28 ENCOUNTER — Telehealth: Payer: Self-pay

## 2019-09-28 DIAGNOSIS — I10 Essential (primary) hypertension: Secondary | ICD-10-CM

## 2019-09-28 NOTE — Telephone Encounter (Signed)
-----  Message from Fayrene Helper, MD sent at 09/28/2019  7:48 AM EST ----- Regarding: pls call pt Needs chem 7 and eGFR today / tomorrow, had been advised needed it on Monday, kidney function has deteriorated, may need to get iV fluids if worse so need stat this morning please

## 2019-09-30 LAB — BASIC METABOLIC PANEL WITH GFR
BUN/Creatinine Ratio: 31 (calc) — ABNORMAL HIGH (ref 6–22)
BUN: 50 mg/dL — ABNORMAL HIGH (ref 7–25)
CO2: 26 mmol/L (ref 20–32)
Calcium: 9.8 mg/dL (ref 8.6–10.4)
Chloride: 101 mmol/L (ref 98–110)
Creat: 1.62 mg/dL — ABNORMAL HIGH (ref 0.60–0.93)
GFR, Est African American: 36 mL/min/{1.73_m2} — ABNORMAL LOW (ref >=60)
GFR, Est Non African American: 31 mL/min/{1.73_m2} — ABNORMAL LOW (ref >=60)
Glucose, Bld: 142 mg/dL — ABNORMAL HIGH (ref 65–139)
Potassium: 4.8 mmol/L (ref 3.5–5.3)
Sodium: 137 mmol/L (ref 135–146)

## 2019-10-04 ENCOUNTER — Emergency Department (HOSPITAL_COMMUNITY)
Admission: EM | Admit: 2019-10-04 | Discharge: 2019-10-05 | Disposition: A | Discharge status: Home / Self Care | Payer: Medicare HMO | Attending: Emergency Medicine | Admitting: Emergency Medicine

## 2019-10-04 ENCOUNTER — Telehealth: Payer: Self-pay

## 2019-10-04 ENCOUNTER — Encounter (HOSPITAL_COMMUNITY): Payer: Self-pay | Admitting: Emergency Medicine

## 2019-10-04 ENCOUNTER — Emergency Department (HOSPITAL_COMMUNITY): Payer: Medicare HMO

## 2019-10-04 ENCOUNTER — Other Ambulatory Visit: Payer: Self-pay

## 2019-10-04 DIAGNOSIS — Z79899 Other long term (current) drug therapy: Secondary | ICD-10-CM | POA: Diagnosis not present

## 2019-10-04 DIAGNOSIS — Z951 Presence of aortocoronary bypass graft: Secondary | ICD-10-CM | POA: Insufficient documentation

## 2019-10-04 DIAGNOSIS — N183 Chronic kidney disease, stage 3 unspecified: Secondary | ICD-10-CM | POA: Diagnosis not present

## 2019-10-04 DIAGNOSIS — R1084 Generalized abdominal pain: Secondary | ICD-10-CM | POA: Diagnosis present

## 2019-10-04 DIAGNOSIS — N3000 Acute cystitis without hematuria: Secondary | ICD-10-CM | POA: Diagnosis not present

## 2019-10-04 DIAGNOSIS — E039 Hypothyroidism, unspecified: Secondary | ICD-10-CM | POA: Insufficient documentation

## 2019-10-04 DIAGNOSIS — I2510 Atherosclerotic heart disease of native coronary artery without angina pectoris: Secondary | ICD-10-CM | POA: Diagnosis not present

## 2019-10-04 DIAGNOSIS — E1122 Type 2 diabetes mellitus with diabetic chronic kidney disease: Secondary | ICD-10-CM | POA: Diagnosis not present

## 2019-10-04 DIAGNOSIS — Z7982 Long term (current) use of aspirin: Secondary | ICD-10-CM | POA: Insufficient documentation

## 2019-10-04 DIAGNOSIS — F17210 Nicotine dependence, cigarettes, uncomplicated: Secondary | ICD-10-CM | POA: Insufficient documentation

## 2019-10-04 MED ORDER — MORPHINE SULFATE (PF) 4 MG/ML IV SOLN
4.0000 mg | Freq: Once | INTRAVENOUS | Status: AC
  Administered 2019-10-05: 4 mg via INTRAVENOUS
  Filled 2019-10-04: qty 1

## 2019-10-04 MED ORDER — ONDANSETRON HCL 4 MG/2ML IJ SOLN
4.0000 mg | Freq: Once | INTRAMUSCULAR | Status: AC
  Administered 2019-10-05: 4 mg via INTRAVENOUS
  Filled 2019-10-04: qty 2

## 2019-10-04 NOTE — Telephone Encounter (Signed)
Pt aware to go to the ER today

## 2019-10-04 NOTE — ED Triage Notes (Signed)
Patient c/o bilateral flank pain that radiates into her abdomen. Has been seen x 2 at EDs, seen by PCP. Patient has been unable to get relief at home.

## 2019-10-04 NOTE — Telephone Encounter (Signed)
Yes go to eD she needs to go today

## 2019-10-04 NOTE — Telephone Encounter (Signed)
Patient wants call today about her labs.  She is really upset she is so sick she said.

## 2019-10-04 NOTE — Telephone Encounter (Signed)
Patient states she has felt so bad for about 2 weeks now. Wanted to know about her recent lab results. States she doesn't know if she is having lumbar pain or kidney pain as she is hurting in her back and down her leg with some tingling and numbness in her leg but also states there is pressure like pain around her waistline and it hurts her so bad all she wants to do is lay in bed and shes getting weak from that. Went to urgent care but no scan was done and they only checked her urine which was neg for specific bacteria. Tried to go to ER but waited about 3 hrs last week and left. Please advise if she needs appt here or if she should go back to ER.

## 2019-10-04 NOTE — ED Provider Notes (Signed)
[facility] EMERGENCY DEPARTMENT Provider Note   CSN: [id number] Arrival date & time: 10/04/19  1824     History   Chief Complaint Chief Complaint  Patient presents with  . Abdominal Pain    HPI Tammy Suarez is a 73 y.o. female.     HPI  This is a 73 year old female with a history of chronic kidney disease, coronary artery disease, diabetes who presents with flank pain.  Patient reports mostly right-sided flank pain that radiates into her lower abdomen.  This is been ongoing for just over a week.  She states that she went to urgent care and was treated for possible UTI.  However, she was unable to afford the medications prescribed to her.  Additionally, he states that she called her primary office and had some labs done" they were normal."  She describes ongoing pain in the right flank that radiates into her abdomen.  Currently she rates her pain at 10 out of 10.  She denies any hematuria or dysuria.  No history of kidney stones.  She describes at times as sharp.  She is not had any nausea, vomiting, diarrhea.  At some point she thought she was constipated but took a laxative and is now having normal bowel movements.  She is not had any fevers.  Patient was seen in urgent care on November 9.  She was treated for possible UTI with Keflex and Pyridium (has not taken).  Urine culture had multiple species present.  Past Medical History:  Diagnosis Date  . Arthritis   . Cancer (HCC)    Uterine  . Carpal tunnel syndrome    bilaterally  . Chronic back pain   . CKD (chronic kidney disease) stage 3, GFR 30-59 ml/min   . Coronary atherosclerosis of native coronary artery    Multivessel status post CABG - LIMA to LAD, SVG to OM and SVG to PDA  . Depression   . Diabetes mellitus without complication ([place])   . Essential hypertension, benign   . GERD (gastroesophageal reflux disease)   . Headache   . History of conjunctivitis   . History of hiatal hernia   . Hyperlipidemia   .  Hypothyroidism   . Myocardial infarction ([place])   . Obesity   . Vitamin B 12 deficiency     Patient Active Problem List   Diagnosis Date Noted  . Closed fracture of proximal end of right humerus 04/29/19 05/15/2019  . Fracture of right shoulder 05/02/2019  . Economic stress 04/25/2019  . Shoulder pain, left 04/22/2017  . Neck pain 01/11/2017  . Carpal tunnel syndrome, left   . Benign mole 10/02/2015  . Left carpal tunnel syndrome 12/04/2014  . Coronary atherosclerosis of native coronary artery 11/02/2013  . CKD (chronic kidney disease) stage 3, GFR 30-59 ml/min   . DM (diabetes mellitus), type 2 with renal complications ([place]) 93/71/6967  . Recurrent falls 07/28/2013  . Urinary incontinence 03/23/2013  . Nicotine dependence 11/24/2012  . Hypothyroidism 09/28/2010  . Depression with anxiety 09/28/2010  . FATIGUE 02/08/2009  . VITAMIN B12 DEFICIENCY 12/09/2007  . Hyperlipidemia with target LDL less than 70 12/09/2007  . Morbid obesity ([place]) 12/09/2007  . Essential hypertension, benign 12/09/2007  . GERD 12/09/2007  . Spinal stenosis of lumbar region with radiculopathy 12/09/2007    Past Surgical History:  Procedure Laterality Date  . ABDOMINAL HYSTERECTOMY    . BACK SURGERY    . CARDIAC SURGERY    . CARPAL TUNNEL RELEASE Left 07/23/2016  Procedure: CARPAL TUNNEL RELEASE;  Surgeon: Carole Civil, MD;  Location: [facility];  Service: Orthopedics;  Laterality: Left;  . CERVICAL LAMINECTOMY    . CHOLECYSTECTOMY    . COLONOSCOPY  2007   Dr. Irving Shows: reported to be normal  . CORONARY ARTERY BYPASS GRAFT N/A 11/02/2013   Procedure: CORONARY ARTERY BYPASS GRAFTING (CABG);  Surgeon: Gaye Pollack, MD;  Location: [facility];  Service: Open Heart Surgery;  Laterality: N/A;  CABG x three, using left internal mammary artery and right leg greater saphenous vein harvested endoscopically  . COSMETIC SURGERY  07/01/2012   Recurrent skin infection on lower abdomen, [facility]  . HERNIA REPAIR     . Inguinal herniorrhaphy    . INTRAOPERATIVE TRANSESOPHAGEAL ECHOCARDIOGRAM N/A 11/02/2013   Procedure: INTRAOPERATIVE TRANSESOPHAGEAL ECHOCARDIOGRAM;  Surgeon: Gaye Pollack, MD;  Location: [facility] OR;  Service: Open Heart Surgery;  Laterality: N/A;  . LAPAROSCOPIC GASTRIC BANDING  2009  . LEFT HEART CATHETERIZATION WITH CORONARY ANGIOGRAM N/A 10/30/2013   Procedure: LEFT HEART CATHETERIZATION WITH CORONARY ANGIOGRAM;  Surgeon: Minus Breeding, MD;  Location: [facility] CATH LAB;  Service: Cardiovascular;  Laterality: N/A;  . Left index finger repair    . LUMBAR FUSION    . SPINE SURGERY    . VESICOVAGINAL FISTULA CLOSURE W/ TAH       OB History    Gravida  3   Para  3   Term  3   Preterm      AB      Living  1     SAB      TAB      Ectopic      Multiple      Live Births               Home Medications    Prior to Admission medications   Medication Sig Start Date End Date Taking? Authorizing Provider  ACCU-CHEK AVIVA PLUS test strip AS DIRECTED 12/27/18   Fayrene Helper, MD  ACCU-CHEK FASTCLIX LANCETS MISC AS DIRECTED 12/27/18   Fayrene Helper, MD  alendronate (FOSAMAX) 70 MG tablet Take 1 tablet (70 mg total) by mouth every 7 (seven) days. Take with a full glass of water on an empty stomach. 08/24/19   Fayrene Helper, MD  aspirin EC 81 MG tablet Take 81 mg by mouth daily.    [provider]  Aspirin-Acetaminophen-Caffeine (GOODY HEADACHE PO) Take 1 Package by mouth 2 (two) times daily as needed (headache).    [provider]  blood glucose meter kit and supplies Dispense based on patient and insurance preference. Once daily testing dx e11.9 12/27/18   Fayrene Helper, MD  cephALEXin (KEFLEX) 500 MG capsule Take 1 capsule (500 mg total) by mouth 3 (three) times daily. 10/05/19   Adelynn Gipe, Barbette Hair, MD  clonazePAM Bobbye [place]) 1 MG tablet Take one tablet by mouth, three times daily for anxiety 05/18/19   Fayrene Helper, MD  clonazePAM  Bobbye [place]) 1 MG tablet Take one tablet by mouth three times daily for anxiety 08/24/19   Fayrene Helper, MD  clonazePAM Bobbye [place]) 1 MG tablet Take one tablet three times daily for anxiety, by mouth 08/24/19   Fayrene Helper, MD  DULoxetine (CYMBALTA) 60 MG capsule TAKE 1 CAPSULE BY MOUTH TWICE DAILY 05/30/19   Fayrene Helper, MD  famotidine (PEPCID) 20 MG tablet Take 1 tablet (20 mg total) by mouth 2 (two) times daily. 04/25/19   Fayrene Helper,  MD  fenofibrate (TRICOR) 145 MG tablet TAKE 1 TABLET BY MOUTH EVERY DAY 05/30/19   Fayrene Helper, MD  FLUoxetine (PROZAC) 20 MG capsule Take 1 capsule (20 mg total) by mouth daily. 08/24/19   Fayrene Helper, MD  glipiZIDE (GLUCOTROL XL) 2.5 MG 24 hr tablet TAKE 1 TABLET(2.5 MG) BY MOUTH DAILY WITH BREAKFAST 08/24/19   Fayrene Helper, MD  hydrochlorothiazide (MICROZIDE) 12.5 MG capsule TAKE 1 CAPSULE(12.5 MG) BY MOUTH DAILY 08/21/19   Fayrene Helper, MD  isosorbide mononitrate (IMDUR) 30 MG 24 hr tablet Take 1 tablet (30 mg total) by mouth at bedtime. 09/26/19 12/25/19  Satira Sark, MD  levothyroxine (SYNTHROID) 100 MCG tablet TAKE 1 TABLET BY MOUTH EVERY DAY 07/17/19   Fayrene Helper, MD  levothyroxine (SYNTHROID) 112 MCG tablet Take 1 tablet (112 mcg total) by mouth daily. 08/24/19   Fayrene Helper, MD  losartan (COZAAR) 50 MG tablet TAKE 1 TABLET(50 MG) BY MOUTH DAILY 04/24/19   Fayrene Helper, MD  metoprolol succinate (TOPROL-XL) 25 MG 24 hr tablet TAKE 1 TABLET BY MOUTH EVERY DAY 05/30/19   Fayrene Helper, MD  Multiple Vitamin (MULTIVITAMIN) tablet Take 1 tablet by mouth daily.    [provider]  nitroGLYCERIN (NITROSTAT) 0.4 MG SL tablet DISSOLVE 1 TABLET UNDER THE TONGUE EVERY 5 MINUTES AS NEEDED FOR CHEST PAIN 03/13/19   Satira Sark, MD  Omega-3 Fatty Acids (FISH OIL PO) Take 1 capsule by mouth daily.    [provider]  oxybutynin (DITROPAN-XL) 5 MG 24 hr tablet Take 1  tablet (5 mg total) by mouth at bedtime. 08/28/19   Fayrene Helper, MD  oxyCODONE-acetaminophen (PERCOCET) 10-325 MG tablet TK 1 T PO  Q 6 H PRF CHRONIC PAIN 05/13/18   [provider]  phenazopyridine (PYRIDIUM) 200 MG tablet Take 1 tablet (200 mg total) by mouth 3 (three) times daily. 09/25/19   Wurst, [place], PA-C  rosuvastatin (CRESTOR) 40 MG tablet Take 1 tablet (40 mg total) by mouth daily. 08/24/19   Fayrene Helper, MD    Family History Family History  Problem Relation Age of Onset  . Heart attack Mother   . Heart attack Father   . Hypertension Sister   . Heart attack Sister   . Cancer Brother        Renal cell   . Hepatitis Daughter   . Drug abuse Daughter   . Hypertension Daughter     Social History Social History   Tobacco Use  . Smoking status: Current Every Day Smoker    Packs/day: 0.25    Years: 50.00    Pack years: 12.50    Types: Cigarettes  . Smokeless tobacco: Never Used  . Tobacco comment: smokes about 10 a day   Substance Use Topics  . Alcohol use: No    Alcohol/week: 0.0 standard drinks  . Drug use: No     Allergies   Gabapentin, Meperidine hcl, Niacin, Propoxyphene n-acetaminophen, and Hydrocodone-acetaminophen   Review of Systems Review of Systems  Constitutional: Negative for fever.  Respiratory: Negative for shortness of breath.   Cardiovascular: Negative for chest pain.  Gastrointestinal: Positive for abdominal pain and constipation. Negative for diarrhea and vomiting.  Genitourinary: Positive for flank pain. Negative for dysuria and hematuria.  All other systems reviewed and are negative.    Physical Exam Updated Vital Signs BP (!) 161/84   Pulse 83   Temp 98.5 F (36.9 C) (Oral)  Resp 20   Ht 1.651 m ('5\' 5"'$ )   Wt 87.1 kg   SpO2 100%   BMI 31.95 kg/m   Physical Exam Vitals signs and nursing note reviewed.  Constitutional:      Appearance: She is well-developed.     Comments: Elderly, overweight,  nontoxic-appearing  HENT:     Head: Normocephalic and atraumatic.  Eyes:     Pupils: Pupils are equal, round, and reactive to light.  Neck:     Musculoskeletal: Neck supple.  Cardiovascular:     Rate and Rhythm: Normal rate and regular rhythm.     Heart sounds: Normal heart sounds.  Pulmonary:     Effort: Pulmonary effort is normal. No respiratory distress.     Breath sounds: No wheezing.  Abdominal:     General: Bowel sounds are normal.     Palpations: Abdomen is soft.     Tenderness: There is no abdominal tenderness. There is no right CVA tenderness or left CVA tenderness.  Skin:    General: Skin is warm and dry.  Neurological:     Mental Status: She is alert and oriented to person, place, and time.  Psychiatric:        Mood and Affect: Mood normal.      ED Treatments / Results  Labs (all labs ordered are listed, but only abnormal results are displayed) Labs Reviewed  CBC WITH DIFFERENTIAL/PLATELET - Abnormal; Notable for the following components:      Result Value   RBC 3.65 (*)    MCV 101.6 (*)    All other components within normal limits  COMPREHENSIVE METABOLIC PANEL - Abnormal; Notable for the following components:   Sodium 134 (*)    Glucose, Bld 176 (*)    BUN 46 (*)    Creatinine, Ser 1.95 (*)    AST 48 (*)    GFR calc non Af Amer 25 (*)    GFR calc Af Amer 29 (*)    All other components within normal limits  URINALYSIS, ROUTINE W REFLEX MICROSCOPIC - Abnormal; Notable for the following components:   APPearance HAZY (*)    Leukocytes,Ua LARGE (*)    Bacteria, UA RARE (*)    All other components within normal limits  URINE CULTURE  LIPASE, BLOOD    EKG None  Radiology Ct Renal Stone Study  Result Date: 10/05/2019 CLINICAL DATA:  Bilateral flank pain EXAM: CT ABDOMEN AND PELVIS WITHOUT CONTRAST TECHNIQUE: Multidetector CT imaging of the abdomen and pelvis was performed following the standard protocol without IV contrast. COMPARISON:  12/12/2012  FINDINGS: Lower chest: Diffuse coronary artery calcifications. No acute findings. Hepatobiliary: No focal hepatic abnormality. Prior cholecystectomy. Common bile duct prominent at 12 mm, likely related to post cholecystectomy state. Pancreas: No focal abnormality or ductal dilatation. Spleen: No focal abnormality.  Normal size. Adrenals/Urinary Tract: Punctate nonobstructing left renal stones. Small cysts in the mid and lower pole of the left kidney. No ureteral stones or hydronephrosis. Adrenal glands and urinary bladder unremarkable. Stomach/Bowel: Gastric band device in place around the proximal stomach. Moderate stool burden throughout the colon. Stomach, large and small bowel grossly unremarkable. Normal appendix. Vascular/Lymphatic: Aortic atherosclerosis. No enlarged abdominal or pelvic lymph nodes. Reproductive: Prior hysterectomy.  No adnexal masses. Other: No free fluid or free air. Musculoskeletal: Diffuse degenerative disc and facet disease. No acute bony abnormality. IMPRESSION: Diffuse coronary artery calcifications.  Aortic atherosclerosis. Gastric band device in place.  No complicating feature. Moderate stool throughout the colon. Punctate nonobstructing  left renal stones. No ureteral stones or hydronephrosis bilaterally. Prior cholecystectomy and hysterectomy. Electronically Signed   By: Rolm Baptise M.D.   On: 10/05/2019 01:24    Procedures Procedures (including critical care time)  Medications Ordered in ED Medications  cefTRIAXone (ROCEPHIN) 1 g in sodium chloride 0.9 % 100 mL IVPB (1 g Intravenous New Bag/Given 10/05/19 0259)  morphine 4 MG/ML injection 4 mg (4 mg Intravenous Given 10/05/19 0053)  ondansetron (ZOFRAN) injection 4 mg (4 mg Intravenous Given 10/05/19 0053)     Initial Impression / Assessment and Plan / ED Course  I have reviewed the triage vital signs and the nursing notes.  Pertinent labs & imaging results that were available during my care of the patient were  reviewed by me and considered in my medical decision making (see chart for details).        Patient presents with flank and abdominal pain.  Overall nontoxic-appearing and vital signs are reassuring.  No known history of kidney stones.  Her abdominal exam is fairly benign.  Considerations include kidney stone, urinary tract infection.  Less likely intra-abdominal pathology such as cholecystitis, pancreatitis, appendicitis.  CT stone study obtained.  Basic lab work obtained and reassuring.  No leukocytosis.  Liver function and lipase are normal.  She does have large leukocyte esterase, 21-50 white cells and rare bacteria in her urine.  Feel her symptoms could be related to a UTI.  Her CT stone study also shows a large stool burden.  I discussed this with the patient.  She was given a dose of Rocephin.  It appears that she was prescribed cephalexin but states that it was over $40 at the pharmacy.  I informed her that this is on the $4 list at [facility].  Will prescribe this for her and have her pick it up at [facility].  Additionally, recommend MiraLAX for constipation.  After history, exam, and medical workup I feel the patient has been appropriately medically screened and is safe for discharge home. Pertinent diagnoses were discussed with the patient. Patient was given return precautions.   Final Clinical Impressions(s) / ED Diagnoses   Final diagnoses:  Generalized abdominal pain  Acute cystitis without hematuria    ED Discharge Orders         Ordered    cephALEXin (KEFLEX) 500 MG capsule  3 times daily     10/05/19 0302           Shadrick Senne, Barbette Hair, MD 10/05/19 [phone]

## 2019-10-05 LAB — CBC WITH DIFFERENTIAL/PLATELET
Abs Immature Granulocytes: 0.02 10*3/uL (ref 0.00–0.07)
Basophils Absolute: 0 10*3/uL (ref 0.0–0.1)
Basophils Relative: 0 %
Eosinophils Absolute: 0 10*3/uL (ref 0.0–0.5)
Eosinophils Relative: 1 %
HCT: 37.1 % (ref 36.0–46.0)
Hemoglobin: 12 g/dL (ref 12.0–15.0)
Immature Granulocytes: 0 %
Lymphocytes Relative: 32 %
Lymphs Abs: 1.6 10*3/uL (ref 0.7–4.0)
MCH: 32.9 pg (ref 26.0–34.0)
MCHC: 32.3 g/dL (ref 30.0–36.0)
MCV: 101.6 fL — ABNORMAL HIGH (ref 80.0–100.0)
Monocytes Absolute: 0.5 10*3/uL (ref 0.1–1.0)
Monocytes Relative: 10 %
Neutro Abs: 2.8 10*3/uL (ref 1.7–7.7)
Neutrophils Relative %: 57 %
Platelets: 155 10*3/uL (ref 150–400)
RBC: 3.65 MIL/uL — ABNORMAL LOW (ref 3.87–5.11)
RDW: 12.5 % (ref 11.5–15.5)
WBC: 5 10*3/uL (ref 4.0–10.5)
nRBC: 0 % (ref 0.0–0.2)

## 2019-10-05 LAB — URINALYSIS, ROUTINE W REFLEX MICROSCOPIC
Bilirubin Urine: NEGATIVE
Glucose, UA: NEGATIVE mg/dL
Hgb urine dipstick: NEGATIVE
Ketones, ur: NEGATIVE mg/dL
Nitrite: NEGATIVE
Protein, ur: NEGATIVE mg/dL
Specific Gravity, Urine: 1.014 (ref 1.005–1.030)
pH: 5 (ref 5.0–8.0)

## 2019-10-05 LAB — COMPREHENSIVE METABOLIC PANEL
ALT: 17 U/L (ref 0–44)
AST: 48 U/L — ABNORMAL HIGH (ref 15–41)
Albumin: 3.5 g/dL (ref 3.5–5.0)
Alkaline Phosphatase: 43 U/L (ref 38–126)
Anion gap: 10 (ref 5–15)
BUN: 46 mg/dL — ABNORMAL HIGH (ref 8–23)
CO2: 22 mmol/L (ref 22–32)
Calcium: 9.5 mg/dL (ref 8.9–10.3)
Chloride: 102 mmol/L (ref 98–111)
Creatinine, Ser: 1.95 mg/dL — ABNORMAL HIGH (ref 0.44–1.00)
GFR calc Af Amer: 29 mL/min — ABNORMAL LOW (ref >60)
GFR calc non Af Amer: 25 mL/min — ABNORMAL LOW (ref >60)
Glucose, Bld: 176 mg/dL — ABNORMAL HIGH (ref 70–99)
Potassium: 4.9 mmol/L (ref 3.5–5.1)
Sodium: 134 mmol/L — ABNORMAL LOW (ref 135–145)
Total Bilirubin: 1.2 mg/dL (ref 0.3–1.2)
Total Protein: 6.8 g/dL (ref 6.5–8.1)

## 2019-10-05 LAB — LIPASE, BLOOD: Lipase: 17 U/L (ref 11–51)

## 2019-10-05 MED ORDER — CEPHALEXIN 500 MG PO CAPS: 500.0000 mg | ORAL_CAPSULE | Freq: Three times a day (TID) | ORAL | 0 refills | Status: DC

## 2019-10-05 MED ORDER — SODIUM CHLORIDE 0.9 % IV SOLN
1.0000 g | Freq: Once | INTRAVENOUS | Status: AC
  Administered 2019-10-05: 1 g via INTRAVENOUS
  Filled 2019-10-05: qty 10

## 2019-10-05 NOTE — Discharge Instructions (Addendum)
You were seen today for abdominal and flank pain.  Your work-up is notable for possible urinary tract infection.  Additionally, you do have a large amount of stool on your CT scan.  You may take over-the-counter MiraLAX.  1 capful in the drink of your choice daily.  You need antibiotics for your urinary tract infection.  Cephalexin is on the $4 [facility] list.  If you develop fevers, worsening pain, nausea, vomiting, any new or worsening changes you should be reevaluated.

## 2019-10-06 LAB — URINE CULTURE: Culture: <10000 — AB

## 2019-10-26 ENCOUNTER — Encounter: Payer: Medicare HMO | Admitting: Family Medicine

## 2019-10-27 ENCOUNTER — Other Ambulatory Visit: Payer: Self-pay

## 2019-10-27 MED ORDER — ISOSORBIDE MONONITRATE ER 30 MG PO TB24: 30.0000 mg | ORAL_TABLET | Freq: Every day | ORAL | 1 refills | Status: DC

## 2019-10-27 NOTE — Telephone Encounter (Signed)
Refilled imdur 

## 2019-11-30 ENCOUNTER — Encounter: Payer: Medicare HMO | Admitting: Family Medicine

## 2019-12-09 ENCOUNTER — Other Ambulatory Visit: Payer: Self-pay | Admitting: Family Medicine

## 2019-12-21 ENCOUNTER — Other Ambulatory Visit: Payer: Self-pay

## 2019-12-21 ENCOUNTER — Encounter: Payer: Self-pay | Admitting: Family Medicine

## 2019-12-21 ENCOUNTER — Ambulatory Visit (INDEPENDENT_AMBULATORY_CARE_PROVIDER_SITE_OTHER): Payer: Medicare HMO | Admitting: Family Medicine

## 2019-12-21 VITALS — BP 148/78 | HR 61 | Temp 98.3°F | Ht 65.0 in | Wt 206.0 lb

## 2019-12-21 DIAGNOSIS — Z122 Encounter for screening for malignant neoplasm of respiratory organs: Secondary | ICD-10-CM

## 2019-12-21 DIAGNOSIS — F17218 Nicotine dependence, cigarettes, with other nicotine-induced disorders: Secondary | ICD-10-CM

## 2019-12-21 DIAGNOSIS — E1121 Type 2 diabetes mellitus with diabetic nephropathy: Secondary | ICD-10-CM

## 2019-12-21 DIAGNOSIS — E038 Other specified hypothyroidism: Secondary | ICD-10-CM

## 2019-12-21 DIAGNOSIS — E785 Hyperlipidemia, unspecified: Secondary | ICD-10-CM

## 2019-12-21 DIAGNOSIS — J3489 Other specified disorders of nose and nasal sinuses: Secondary | ICD-10-CM | POA: Diagnosis not present

## 2019-12-21 DIAGNOSIS — Z1211 Encounter for screening for malignant neoplasm of colon: Secondary | ICD-10-CM

## 2019-12-21 DIAGNOSIS — I10 Essential (primary) hypertension: Secondary | ICD-10-CM

## 2019-12-21 DIAGNOSIS — F418 Other specified anxiety disorders: Secondary | ICD-10-CM | POA: Diagnosis not present

## 2019-12-21 DIAGNOSIS — Z0000 Encounter for general adult medical examination without abnormal findings: Secondary | ICD-10-CM

## 2019-12-21 DIAGNOSIS — E559 Vitamin D deficiency, unspecified: Secondary | ICD-10-CM

## 2019-12-21 MED ORDER — CLONAZEPAM 0.5 MG PO TABS: ORAL_TABLET | ORAL | 1 refills | Status: DC

## 2019-12-21 NOTE — Patient Instructions (Addendum)
F/U in office with MD in 7 weeks , re evaluate anxiety, needs gGD score at visit , call if you need me sooner  Fasting lipid, cmp and eGFR, HBa1c and TSH and vit D a in the next 1 week   Lung  Scan to be scheduled, Please cut back cigarettes aim for 5/ day in next 2 months  cologuard test result to be obtained for record  Eye exam past due pls let us know if we can refer you for this so it is done   pLease no more falls or accidents  Reduced dose of klonopin, no new medication at this vist for anxiety, practice behavioral techniques to calm down  Antibiotic oint sparingly two times daily to nostril for 3 days, then you may use vaseline if dry, Use Qtip to apply, not your finger  Thanks for choosing [facility], we consider it a privelige to serve you.  

## 2019-12-21 NOTE — Progress Notes (Signed)
    Tammy Suarez     MRN: [id number]      DOB: 11-Dec-1945  HPI: Patient is in for annual physical exam. Anxiety and medication management of this is  Addressed in light of need for narcotic pain management and recurrent falls and accidents C/o stuffy nose with sores inside the nostrils Recent labs, if available are reviewed. Immunization is reviewed , and  updated if needed.   PE: BP (!) 148/78 (BP Location: Left Arm, Patient Position: Sitting)   Pulse 61   Temp 98.3 F (36.8 C) (Temporal)   Ht 5\' 5"  (1.651 m)   Wt 206 lb (93.4 kg)   SpO2 99%   BMI 34.28 kg/m   Pleasant  female, alert and oriented x 3, in no cardio-pulmonary distress. Afebrile. HEENT No facial trauma or asymetry. Sinuses non tender.  Extra occullar muscles intact.. External ears normal, . Neck: decreased ROM, no adenopathy,JVD or thyromegaly.No bruits.  Chest: Clear to ascultation bilaterally.No crackles or wheezes. Non tender to palpation  Breast: No physical exam, no complaints, normal mammogram in 08/2019  Cardiovascular system; Heart sounds normal,  S1 and  S2 ,no S3.  No murmur, or thrill. Apical beat not displaced Peripheral pulses normal.  Abdomen: Soft, non tender,No guarding, tenderness or rebound.     Musculoskeletal exam: Decreased  ROM of spine, hips , shoulders and knees.  deformity ,swelling or crepitus noted.  muscle wasting and  atrophy.   Neurologic: Cranial nerves 2 to 12 intact. Power, tone ,s normal throughout.  disturbance in gait. No tremor.  Skin: Intact, no ulceration, erythema , scaling or rash noted.Bruises noted Pigmentation normal throughout  Psych; Normal mood and affect. Judgement and concentration normal   Assessment & Plan:  Annual physical exam Annual exam as documented. Counseling done  re healthy lifestyle involving commitment to 150 minutes exercise per week, heart healthy diet, and attaining healthy weight.The importance of adequate sleep also  discussed. Regular seat belt use and home safety, is also discussed. Changes in health habits are decided on by the patient with goals and time frames  set for achieving them. Immunization and cancer screening needs are specifically addressed at this visit.   Depression with anxiety Reduce anxiolytic dose , pan is to change to buspar due to the fact that she remains on chronic pain management and has a h/o multiple falls and accidents  Nicotine dependence Asked:confirms currently smokes 5  Cigarettes/ day Assess: willing to quit but cutting back Advise: needs to QUIT to reduce risk of cancer, cardio and cerebrovascular disease Assist: counseled for 5 minutes and literature provided Arrange: follow up in 3 months   Nasal sore Topical bactroban twice daily for 3 days, and desist of picking nose, vaseline following antibiotic

## 2019-12-24 ENCOUNTER — Encounter: Payer: Self-pay | Admitting: Family Medicine

## 2019-12-24 DIAGNOSIS — J3489 Other specified disorders of nose and nasal sinuses: Secondary | ICD-10-CM | POA: Insufficient documentation

## 2019-12-24 NOTE — Assessment & Plan Note (Signed)
Topical bactroban twice daily for 3 days, and desist of picking nose, vaseline following antibiotic

## 2019-12-24 NOTE — Assessment & Plan Note (Signed)
Asked:confirms currently smokes 5  Cigarettes/ day Assess: willing to quit but cutting back Advise: needs to QUIT to reduce risk of cancer, cardio and cerebrovascular disease Assist: counseled for 5 minutes and literature provided Arrange: follow up in 3 months

## 2019-12-24 NOTE — Assessment & Plan Note (Signed)
Reduce anxiolytic dose , pan is to change to buspar due to the fact that she remains on chronic pain management and has a h/o multiple falls and accidents

## 2019-12-24 NOTE — Assessment & Plan Note (Signed)

## 2019-12-25 ENCOUNTER — Other Ambulatory Visit: Payer: Self-pay | Admitting: Family Medicine

## 2019-12-26 ENCOUNTER — Encounter (HOSPITAL_COMMUNITY): Payer: Self-pay | Admitting: *Deleted

## 2019-12-26 ENCOUNTER — Other Ambulatory Visit (HOSPITAL_COMMUNITY): Payer: Self-pay | Admitting: *Deleted

## 2019-12-26 DIAGNOSIS — Z122 Encounter for screening for malignant neoplasm of respiratory organs: Secondary | ICD-10-CM

## 2019-12-26 DIAGNOSIS — Z87891 Personal history of nicotine dependence: Secondary | ICD-10-CM

## 2019-12-26 LAB — VITAMIN D 25 HYDROXY (VIT D DEFICIENCY, FRACTURES): Vit D, 25-Hydroxy: 24 ng/mL — ABNORMAL LOW (ref 30–100)

## 2019-12-26 LAB — COMPLETE METABOLIC PANEL WITH GFR
AG Ratio: 1.5 (calc) (ref 1.0–2.5)
ALT: 10 U/L (ref 6–29)
AST: 23 U/L (ref 10–35)
Albumin: 3.8 g/dL (ref 3.6–5.1)
Alkaline phosphatase (APISO): 41 U/L (ref 37–153)
BUN/Creatinine Ratio: 27 (calc) — ABNORMAL HIGH (ref 6–22)
BUN: 55 mg/dL — ABNORMAL HIGH (ref 7–25)
CO2: 27 mmol/L (ref 20–32)
Calcium: 10.6 mg/dL — ABNORMAL HIGH (ref 8.6–10.4)
Chloride: 103 mmol/L (ref 98–110)
Creat: 2.04 mg/dL — ABNORMAL HIGH (ref 0.60–0.93)
GFR, Est African American: 27 mL/min/{1.73_m2} — ABNORMAL LOW (ref >=60)
GFR, Est Non African American: 23 mL/min/{1.73_m2} — ABNORMAL LOW (ref >=60)
Globulin: 2.5 g/dL (calc) (ref 1.9–3.7)
Glucose, Bld: 81 mg/dL (ref 65–99)
Potassium: 5.8 mmol/L — ABNORMAL HIGH (ref 3.5–5.3)
Sodium: 137 mmol/L (ref 135–146)
Total Bilirubin: 0.6 mg/dL (ref 0.2–1.2)
Total Protein: 6.3 g/dL (ref 6.1–8.1)

## 2019-12-26 LAB — LIPID PANEL
Cholesterol: 93 mg/dL (ref <200)
HDL: 18 mg/dL — ABNORMAL LOW (ref >=50)
LDL Cholesterol (Calc): 51 mg/dL (calc)
Non-HDL Cholesterol (Calc): 75 mg/dL (calc) (ref <130)
Total CHOL/HDL Ratio: 5.2 (calc) — ABNORMAL HIGH (ref <5.0)
Triglycerides: 160 mg/dL — ABNORMAL HIGH (ref <150)

## 2019-12-26 LAB — HEMOGLOBIN A1C
Hgb A1c MFr Bld: 6.1 % of total Hgb — ABNORMAL HIGH (ref <5.7)
Mean Plasma Glucose: 128 (calc)
eAG (mmol/L): 7.1 (calc)

## 2019-12-26 LAB — TSH: TSH: 0.13 mIU/L — ABNORMAL LOW (ref 0.40–4.50)

## 2019-12-26 NOTE — Progress Notes (Signed)
Received referral for initial lung cancer screening scan from Dr. Tula Nakayama.  Contacted patient and obtained smoking history (started age 74, has smoked 1 pack per day since that time.  She did quit for about 4 years, currently smokes, 44  pack year history) as well as answering questions related to the screening process.  Patient denies signs/symptoms of lung cancer such as weight loss or hemoptysis.  Patient denies comorbidity that would prevent curative treatment if lung cancer were to be found.  Patient is scheduled for shared decision making visit and CT scan on 01/09/2020 at 1030am.

## 2019-12-26 NOTE — Progress Notes (Signed)
Orders for LDCT placed.

## 2019-12-28 ENCOUNTER — Ambulatory Visit (INDEPENDENT_AMBULATORY_CARE_PROVIDER_SITE_OTHER): Payer: Medicare HMO | Admitting: Cardiology

## 2019-12-28 ENCOUNTER — Encounter: Payer: Self-pay | Admitting: Cardiology

## 2019-12-28 ENCOUNTER — Other Ambulatory Visit: Payer: Self-pay

## 2019-12-28 VITALS — BP 174/79 | HR 59 | Temp 98.4°F | Ht 65.0 in | Wt 202.0 lb

## 2019-12-28 DIAGNOSIS — I25119 Atherosclerotic heart disease of native coronary artery with unspecified angina pectoris: Secondary | ICD-10-CM

## 2019-12-28 DIAGNOSIS — I10 Essential (primary) hypertension: Secondary | ICD-10-CM

## 2019-12-28 DIAGNOSIS — N1832 Chronic kidney disease, stage 3b: Secondary | ICD-10-CM | POA: Diagnosis not present

## 2019-12-28 DIAGNOSIS — E782 Mixed hyperlipidemia: Secondary | ICD-10-CM

## 2019-12-28 NOTE — Patient Instructions (Signed)
Medication Instructions:  Your physician recommends that you continue on your current medications as directed. Please refer to the Current Medication list given to you today.  *If you need a refill on your cardiac medications before your next appointment, please call your pharmacy*  Lab Work: none If you have labs (blood work) drawn today and your tests are completely normal, you will receive your results only by: Marland Kitchen MyChart Message (if you have MyChart) OR . A paper copy in the mail If you have any lab test that is abnormal or we need to change your treatment, we will call you to review the results.  Testing/Procedures: none  Follow-Up: At [facility], you and your health needs are our priority.  As part of our continuing mission to provide you with exceptional heart care, we have created designated Provider Care Teams.  These Care Teams include your primary Cardiologist (physician) and Advanced Practice Providers (APPs -  Physician Assistants and Nurse Practitioners) who all work together to provide you with the care you need, when you need it.  Your next appointment:   12 month(s)  The format for your next appointment:   In Person  Provider:   Rozann Lesches, MD     Thank you for choosing [facility] !

## 2019-12-28 NOTE — Progress Notes (Signed)
Cardiology Office Note  Date: 12/28/2019   ID: Tammy Suarez, DOB 12/28/1945, MRN [id number]  PCP:  Fayrene Helper, MD  Cardiologist:  Rozann Lesches, MD Electrophysiologist:  None   Chief Complaint  Patient presents with  . Cardiac follow-up    History of Present Illness: Tammy Suarez is a 74 y.o. female referred back to the office by Dr. Moshe Cipro for follow-up, she was last seen in July 2019.  From a cardiac standpoint, she does not describe any progressive angina symptoms, uses a single nitroglycerin perhaps once every 2 to 3 months.  She states that she has been compliant with her medications.  I reviewed her current cardiac regimen which includes aspirin, Imdur, Cozaar, HCTZ, Toprol-XL, and as needed nitroglycerin.  I reviewed the recent lab work obtained by Dr. Moshe Cipro in February, outlined below.  LDL was 51.  Creatinine up to 2.04 with potassium 5.8.  She is not on potassium supplements.  Last ischemic testing was in October 2019, low risk without active ischemia.  I personally reviewed her ECG today which shows sinus bradycardia with old anterior infarct pattern, ST-T wave abnormalities.  Past Medical History:  Diagnosis Date  . Arthritis   . Carpal tunnel syndrome    bilaterally  . Chronic back pain   . CKD (chronic kidney disease) stage 3, GFR 30-59 ml/min   . Coronary atherosclerosis of native coronary artery    Multivessel status post CABG - LIMA to LAD, SVG to OM and SVG to PDA  . Depression   . Essential hypertension   . GERD (gastroesophageal reflux disease)   . Headache   . History of conjunctivitis   . History of hiatal hernia   . Hyperlipidemia   . Hypothyroidism   . Myocardial infarction ([place])   . Obesity   . Type 2 diabetes mellitus ([place])   . Uterine cancer ([place])   . Vitamin B 12 deficiency     Past Surgical History:  Procedure Laterality Date  . ABDOMINAL HYSTERECTOMY    . BACK SURGERY    . CARDIAC SURGERY    . CARPAL TUNNEL  RELEASE Left 07/23/2016   Procedure: CARPAL TUNNEL RELEASE;  Surgeon: Carole Civil, MD;  Location: [facility];  Service: Orthopedics;  Laterality: Left;  . CERVICAL LAMINECTOMY    . CHOLECYSTECTOMY    . COLONOSCOPY  2007   Dr. Irving Shows: reported to be normal  . CORONARY ARTERY BYPASS GRAFT N/A 11/02/2013   Procedure: CORONARY ARTERY BYPASS GRAFTING (CABG);  Surgeon: Gaye Pollack, MD;  Location: [facility];  Service: Open Heart Surgery;  Laterality: N/A;  CABG x three, using left internal mammary artery and right leg greater saphenous vein harvested endoscopically  . COSMETIC SURGERY  07/01/2012   Recurrent skin infection on lower abdomen, [facility]  . HERNIA REPAIR    . Inguinal herniorrhaphy    . INTRAOPERATIVE TRANSESOPHAGEAL ECHOCARDIOGRAM N/A 11/02/2013   Procedure: INTRAOPERATIVE TRANSESOPHAGEAL ECHOCARDIOGRAM;  Surgeon: Gaye Pollack, MD;  Location: [facility] OR;  Service: Open Heart Surgery;  Laterality: N/A;  . LAPAROSCOPIC GASTRIC BANDING  2009  . LEFT HEART CATHETERIZATION WITH CORONARY ANGIOGRAM N/A 10/30/2013   Procedure: LEFT HEART CATHETERIZATION WITH CORONARY ANGIOGRAM;  Surgeon: Minus Breeding, MD;  Location: [facility] CATH LAB;  Service: Cardiovascular;  Laterality: N/A;  . Left index finger repair    . LUMBAR FUSION    . SPINE SURGERY    . VESICOVAGINAL FISTULA CLOSURE W/ TAH      Current  Outpatient Medications  Medication Sig Dispense Refill  . ACCU-CHEK AVIVA PLUS test strip AS DIRECTED 300 each 2  . ACCU-CHEK FASTCLIX LANCETS MISC AS DIRECTED 306 each 3  . alendronate (FOSAMAX) 70 MG tablet Take 1 tablet (70 mg total) by mouth every 7 (seven) days. Take with a full glass of water on an empty stomach. 4 tablet 11  . aspirin EC 81 MG tablet Take 81 mg by mouth daily.    . Aspirin-Acetaminophen-Caffeine (GOODY HEADACHE PO) Take 1 Package by mouth 2 (two) times daily as needed (headache).    . blood glucose meter kit and supplies Dispense based on patient and insurance preference. Once  daily testing dx e11.9 1 each 0  . clonazePAM (KLONOPIN) 0.5 MG tablet Take one tablet by mouth three times daily for anxiety 90 tablet 1  . DULoxetine (CYMBALTA) 60 MG capsule TAKE 1 CAPSULE BY MOUTH TWICE DAILY 180 capsule 2  . famotidine (PEPCID) 20 MG tablet Take 1 tablet (20 mg total) by mouth 2 (two) times daily. 60 tablet 5  . fenofibrate (TRICOR) 145 MG tablet TAKE 1 TABLET BY MOUTH EVERY DAY 90 tablet 2  . hydrochlorothiazide (MICROZIDE) 12.5 MG capsule TAKE 1 CAPSULE(12.5 MG) BY MOUTH DAILY 90 capsule 0  . isosorbide mononitrate (IMDUR) 30 MG 24 hr tablet Take 1 tablet (30 mg total) by mouth at bedtime. 30 tablet 1  . losartan (COZAAR) 50 MG tablet TAKE 1 TABLET(50 MG) BY MOUTH DAILY 90 tablet 1  . metoprolol succinate (TOPROL-XL) 25 MG 24 hr tablet TAKE 1 TABLET BY MOUTH EVERY DAY 90 tablet 2  . Multiple Vitamin (MULTIVITAMIN) tablet Take 1 tablet by mouth daily.    . nitroGLYCERIN (NITROSTAT) 0.4 MG SL tablet DISSOLVE 1 TABLET UNDER THE TONGUE EVERY 5 MINUTES AS NEEDED FOR CHEST PAIN 25 tablet 3  . Omega-3 Fatty Acids (FISH OIL PO) Take 1 capsule by mouth daily.    Marland Kitchen oxybutynin (DITROPAN-XL) 5 MG 24 hr tablet Take 1 tablet (5 mg total) by mouth at bedtime. 30 tablet 3  . oxyCODONE-acetaminophen (PERCOCET) 10-325 MG tablet TK 1 T PO  Q 6 H PRF CHRONIC PAIN  0  . rosuvastatin (CRESTOR) 40 MG tablet Take 1 tablet (40 mg total) by mouth daily. 90 tablet 1  . levothyroxine (SYNTHROID) 100 MCG tablet TAKE 1 TABLET BY MOUTH EVERY DAY 90 tablet 0  . levothyroxine (SYNTHROID) 112 MCG tablet Take 1 tablet (112 mcg total) by mouth daily. 90 tablet 1   No current facility-administered medications for this visit.   Allergies:  Gabapentin, Meperidine hcl, Niacin, Propoxyphene n-acetaminophen, and Hydrocodone-acetaminophen   ROS: No palpitations or syncope.  Physical Exam: VS:  BP (!) 174/79   Pulse (!) 59   Temp 98.4 F (36.9 C)   Ht '5\' 5"'$  (1.651 m)   Wt 202 lb (91.6 kg)   SpO2 100%    BMI 33.61 kg/m , BMI Body mass index is 33.61 kg/m.  Wt Readings from Last 3 Encounters:  12/28/19 202 lb (91.6 kg)  12/21/19 206 lb (93.4 kg)  10/04/19 192 lb (87.1 kg)    General: Elderly woman, appears comfortable at rest. HEENT: Conjunctiva and lids normal, wearing a mask. Neck: Supple, no elevated JVP or carotid bruits, no thyromegaly. Lungs: Clear to auscultation, nonlabored breathing at rest. Cardiac: Regular rate and rhythm, no S3, soft systolic murmur, no pericardial rub. Abdomen: Soft, nontender, bowel sounds present. Extremities: No pitting edema, distal pulses 2+. Skin: Warm and dry. Musculoskeletal: No  kyphosis. Neuropsychiatric: Alert and oriented x3, affect grossly appropriate.  ECG:  An ECG dated 02/07/2018 was personally reviewed today and demonstrated:  Sinus bradycardia with PAC, increased voltage, decreased R wave progression.  Recent Labwork: 10/05/2019: Hemoglobin 12.0; Platelets 155 12/25/2019: ALT 10; AST 23; BUN 55; Creat 2.04; Potassium 5.8; Sodium 137; TSH 0.13     Component Value Date/Time   CHOL 93 12/25/2019 0859   TRIG 160 (H) 12/25/2019 0859   HDL 18 (L) 12/25/2019 0859   CHOLHDL 5.2 (H) 12/25/2019 0859   VLDL 37 (H) 04/22/2017 0952   LDLCALC 51 12/25/2019 0859    Other Studies Reviewed Today:  Echocardiogram 11/01/2013: Study Conclusions   Left ventricle: The cavity size was normal. Wall thickness  was normal. Systolic function was normal. The estimated  ejection fraction was in the range of 55% to 60%. Wall  motion was normal; there were no regional wall motion  abnormalities. There was an increased relative contribution  of atrial contraction to ventricular filling.  Lexiscan Myoview 08/30/2018:  There was no ST segment deviation noted during stress.  The study is normal. No myocardial ischemia or scar.  This is a low risk study.  Nuclear stress EF: 72%.  CT renal stone study 10/04/2019: FINDINGS: Lower chest: Diffuse coronary  artery calcifications. No acute findings.  Hepatobiliary: No focal hepatic abnormality. Prior cholecystectomy. Common bile duct prominent at 12 mm, likely related to post cholecystectomy state.  Pancreas: No focal abnormality or ductal dilatation.  Spleen: No focal abnormality.  Normal size.  Adrenals/Urinary Tract: Punctate nonobstructing left renal stones. Small cysts in the mid and lower pole of the left kidney. No ureteral stones or hydronephrosis. Adrenal glands and urinary bladder unremarkable.  Stomach/Bowel: Gastric band device in place around the proximal stomach. Moderate stool burden throughout the colon. Stomach, large and small bowel grossly unremarkable. Normal appendix.  Vascular/Lymphatic: Aortic atherosclerosis. No enlarged abdominal or pelvic lymph nodes.  Reproductive: Prior hysterectomy.  No adnexal masses.  Other: No free fluid or free air.  Musculoskeletal: Diffuse degenerative disc and facet disease. No acute bony abnormality.  IMPRESSION: Diffuse coronary artery calcifications.  Aortic atherosclerosis.  Gastric band device in place.  No complicating feature.  Moderate stool throughout the colon.  Punctate nonobstructing left renal stones. No ureteral stones or hydronephrosis bilaterally.  Prior cholecystectomy and hysterectomy.  Assessment and Plan:  1.  Multivessel CAD status post CABG in 2014.  She reports occasional angina symptoms or nitroglycerin use, no progressive pattern.  ECG consistent with previous anterior infarct with diffuse ST-T wave abnormalities.  Her current medical regimen includes aspirin, Crestor, Imdur, Cozaar, and Toprol-XL.  She underwent ischemic testing in 2019 which was low risk.  Continue with observation for now.  2.  Mixed hyperlipidemia, doing well on Crestor with recent LDL 51.  3.  Essential hypertension, blood pressure is elevated today, was not quite as high at recent PCP visit.  She has not been  checking her blood pressure regularly with cuff at home, feels that it might be inconsistent.  Would keep follow-up with Dr. Moshe Cipro in case further medication adjustments are necessary.  Norvasc would be a consideration if titration of Cozaar is limited by renal insufficiency and hyperkalemia.  4.  CKD stage IIIb with hyperkalemia.  Following now with Dr. Moshe Cipro, although she recalls seeing a nephrologist in the past.  May need to reduce Cozaar and possibly evaluate for renal artery stenosis.  Follow-up with Dr. Moshe Cipro.  Medication Adjustments/Labs and Tests Ordered: Current medicines  are reviewed at length with the patient today.  Concerns regarding medicines are outlined above.   Tests Ordered: Orders Placed This Encounter  Procedures  . EKG 12-Lead    Medication Changes: No orders of the defined types were placed in this encounter.   Disposition:  Follow up 1 year in the [place] office.  Signed, Satira Sark, MD, [facility] 12/28/2019 9:17 AM    [facility] [address] [place], [place] [address] Phone: [phone]; Fax: [phone]

## 2020-01-02 ENCOUNTER — Other Ambulatory Visit: Payer: Self-pay | Admitting: Family Medicine

## 2020-01-02 ENCOUNTER — Other Ambulatory Visit: Payer: Self-pay

## 2020-01-02 DIAGNOSIS — N184 Chronic kidney disease, stage 4 (severe): Secondary | ICD-10-CM

## 2020-01-02 DIAGNOSIS — E038 Other specified hypothyroidism: Secondary | ICD-10-CM

## 2020-01-02 MED ORDER — LEVOTHYROXINE SODIUM 100 MCG PO TABS: 100.0000 ug | ORAL_TABLET | Freq: Every day | ORAL | 3 refills | Status: DC

## 2020-01-02 MED ORDER — EZETIMIBE 10 MG PO TABS: 10.0000 mg | ORAL_TABLET | Freq: Every day | ORAL | 3 refills | Status: DC

## 2020-01-02 NOTE — Progress Notes (Signed)
AMB NEPHROLO

## 2020-01-09 ENCOUNTER — Ambulatory Visit (HOSPITAL_COMMUNITY): Admission: RE | Admit: 2020-01-09 | Payer: Medicare HMO | Source: Ambulatory Visit

## 2020-01-09 ENCOUNTER — Telehealth (HOSPITAL_COMMUNITY): Payer: Self-pay | Admitting: *Deleted

## 2020-01-09 NOTE — Telephone Encounter (Signed)
Created in error

## 2020-01-16 LAB — COLOGUARD: Cologuard: POSITIVE — AB

## 2020-01-18 ENCOUNTER — Telehealth: Payer: Self-pay | Admitting: Family Medicine

## 2020-01-18 DIAGNOSIS — R195 Other fecal abnormalities: Secondary | ICD-10-CM

## 2020-01-18 NOTE — Telephone Encounter (Signed)
Pls let her know cologuard is positive, needs colonoscopy, and I have referred her to Dr Sydell Axon, impt she go , needs this to make sure no colon cancer

## 2020-01-18 NOTE — Telephone Encounter (Signed)
Pt aware.

## 2020-01-23 ENCOUNTER — Encounter: Payer: Self-pay | Admitting: Internal Medicine

## 2020-01-23 LAB — HM DIABETES EYE EXAM

## 2020-01-24 ENCOUNTER — Other Ambulatory Visit: Payer: Self-pay | Admitting: Cardiology

## 2020-02-01 ENCOUNTER — Other Ambulatory Visit: Payer: Self-pay | Admitting: Nephrology

## 2020-02-01 ENCOUNTER — Other Ambulatory Visit (HOSPITAL_COMMUNITY): Payer: Self-pay | Admitting: Nephrology

## 2020-02-01 DIAGNOSIS — N189 Chronic kidney disease, unspecified: Secondary | ICD-10-CM

## 2020-02-01 DIAGNOSIS — E1122 Type 2 diabetes mellitus with diabetic chronic kidney disease: Secondary | ICD-10-CM

## 2020-02-05 ENCOUNTER — Encounter (HOSPITAL_COMMUNITY): Payer: Self-pay | Admitting: *Deleted

## 2020-02-05 NOTE — Progress Notes (Signed)
I spoke with patient today and she reports that at this time she does not wish to pursue the LDCT to screen for lung cancer.  She will call me back should she change her mind.

## 2020-02-07 ENCOUNTER — Ambulatory Visit: Payer: Medicare HMO | Admitting: Family Medicine

## 2020-02-08 ENCOUNTER — Ambulatory Visit: Payer: Medicare HMO | Admitting: Family Medicine

## 2020-02-09 ENCOUNTER — Other Ambulatory Visit: Payer: Self-pay

## 2020-02-09 ENCOUNTER — Ambulatory Visit (HOSPITAL_COMMUNITY)
Admission: RE | Admit: 2020-02-09 | Discharge: 2020-02-09 | Disposition: A | Discharge status: Home / Self Care | Payer: Medicare HMO | Source: Ambulatory Visit | Attending: Nephrology | Admitting: Nephrology

## 2020-02-09 DIAGNOSIS — E1122 Type 2 diabetes mellitus with diabetic chronic kidney disease: Secondary | ICD-10-CM | POA: Insufficient documentation

## 2020-02-09 DIAGNOSIS — N189 Chronic kidney disease, unspecified: Secondary | ICD-10-CM | POA: Diagnosis present

## 2020-02-10 NOTE — Progress Notes (Signed)
Referring Provider: Fayrene Helper, MD Primary Care Physician:  Fayrene Helper, MD Primary Gastroenterologist:  Dr. Gala Romney  Chief Complaint  Patient presents with  . + cologuard    strains with BM    HPI:   Tammy Suarez is a 75 y.o. female presenting today at the request of Fayrene Helper, MD for positive Cologuard.  Cologuard was positive on 01/16/2020.   Past medical history significant for CAD s/p CABG in 2014, HTN, HLD, CKD, type II DM, uterine cancer s/p hysterectomy, GERD, anxiety, and depression.  Per review of surgical history, appears patient's last colonoscopy was in 2007 with Dr. Irving Shows and was reported to be normal.  I do not see the full report in our system.  Today: No history of colon polyps. No blood in the stool or black stools. No unintentional weight loss. No abdominal pain. She does have some constipation which she feels is related to pain medications. BMs daily. About 50 % of the time stools are soft and formed, other times stools are hard with straining. Doesn't feel she empties completely. Not taking anything regularly to help with constipation. This is chronic. Taking percocet every 6 hours. No family history of colon cancer. Doesn't eat much fruits and vegetables. Little water.   No nausea, vomiting. GERD symptoms well controlled on pepcid. Had gastric bypass years ago. Reports some dysphagia occasionally with foods feeling like a stop in the lower esophagus. Has been present since her gastric banding in 2009. About once a month. More with meats. No trouble with soft foods or liquids. No prior esophageal dilation. Doesn't feel this needs evaluation at this time.   HTN:  States her BP runs high at times. Has taken BP medication today.  States she is a bit nervous and was rushing to get here today. BP at recent visit with nephrology was 146/76.  Taking goody powders about 3 times a week. Has diffuse joint pain.   Past Medical History:  Diagnosis  Date  . Arthritis   . Carpal tunnel syndrome    bilaterally  . Chronic back pain   . CKD (chronic kidney disease) stage 3, GFR 30-59 ml/min   . Coronary atherosclerosis of native coronary artery    Multivessel status post CABG - LIMA to LAD, SVG to OM and SVG to PDA  . Depression   . Essential hypertension   . GERD (gastroesophageal reflux disease)   . Headache   . History of conjunctivitis   . History of hiatal hernia   . Hyperlipidemia   . Hypothyroidism   . Myocardial infarction ([place])   . Obesity   . Type 2 diabetes mellitus ([place])   . Uterine cancer ([place])   . Vitamin B 12 deficiency     Past Surgical History:  Procedure Laterality Date  . ABDOMINAL HYSTERECTOMY    . BACK SURGERY    . CARDIAC SURGERY    . CARPAL TUNNEL RELEASE Left 07/23/2016   Procedure: CARPAL TUNNEL RELEASE;  Surgeon: Carole Civil, MD;  Location: [facility];  Service: Orthopedics;  Laterality: Left;  . CERVICAL LAMINECTOMY    . CHOLECYSTECTOMY    . COLONOSCOPY  2007   Dr. Irving Shows: reported to be normal  . CORONARY ARTERY BYPASS GRAFT N/A 11/02/2013   Procedure: CORONARY ARTERY BYPASS GRAFTING (CABG);  Surgeon: Gaye Pollack, MD;  Location: [place];  Service: Open Heart Surgery;  Laterality: N/A;  CABG x three, using left internal mammary artery and  right leg greater saphenous vein harvested endoscopically  . COSMETIC SURGERY  07/01/2012   Recurrent skin infection on lower abdomen, [facility]  . HERNIA REPAIR    . Inguinal herniorrhaphy    . INTRAOPERATIVE TRANSESOPHAGEAL ECHOCARDIOGRAM N/A 11/02/2013   Procedure: INTRAOPERATIVE TRANSESOPHAGEAL ECHOCARDIOGRAM;  Surgeon: Gaye Pollack, MD;  Location: [facility] OR;  Service: Open Heart Surgery;  Laterality: N/A;  . LAPAROSCOPIC GASTRIC BANDING  2009  . LEFT HEART CATHETERIZATION WITH CORONARY ANGIOGRAM N/A 10/30/2013   Procedure: LEFT HEART CATHETERIZATION WITH CORONARY ANGIOGRAM;  Surgeon: Minus Breeding, MD;  Location: [facility] CATH LAB;  Service: Cardiovascular;   Laterality: N/A;  . Left index finger repair    . LUMBAR FUSION    . SPINE SURGERY    . VESICOVAGINAL FISTULA CLOSURE W/ TAH      Current Outpatient Medications  Medication Sig Dispense Refill  . ACCU-CHEK AVIVA PLUS test strip AS DIRECTED 300 each 2  . ACCU-CHEK FASTCLIX LANCETS MISC AS DIRECTED 306 each 3  . alendronate (FOSAMAX) 70 MG tablet Take 1 tablet (70 mg total) by mouth every 7 (seven) days. Take with a full glass of water on an empty stomach. 4 tablet 11  . aspirin EC 81 MG tablet Take 81 mg by mouth daily.    . Aspirin-Acetaminophen-Caffeine (GOODY HEADACHE PO) Take 1 Package by mouth 2 (two) times daily as needed (headache).    . blood glucose meter kit and supplies Dispense based on patient and insurance preference. Once daily testing dx e11.9 1 each 0  . clonazePAM (KLONOPIN) 0.5 MG tablet Take one tablet by mouth three times daily for anxiety 90 tablet 1  . DULoxetine (CYMBALTA) 60 MG capsule TAKE 1 CAPSULE BY MOUTH TWICE DAILY 180 capsule 2  . ezetimibe (ZETIA) 10 MG tablet Take 1 tablet (10 mg total) by mouth daily. 90 tablet 3  . famotidine (PEPCID) 20 MG tablet Take 1 tablet (20 mg total) by mouth 2 (two) times daily. (Patient taking differently: Take 20 mg by mouth 2 (two) times daily. Taking daily.) 60 tablet 5  . fenofibrate (TRICOR) 145 MG tablet TAKE 1 TABLET BY MOUTH EVERY DAY 90 tablet 2  . hydrochlorothiazide (MICROZIDE) 12.5 MG capsule TAKE 1 CAPSULE(12.5 MG) BY MOUTH DAILY 90 capsule 0  . isosorbide mononitrate (IMDUR) 30 MG 24 hr tablet TAKE 1 TABLET(30 MG) BY MOUTH AT BEDTIME 30 tablet 6  . levothyroxine (SYNTHROID) 100 MCG tablet Take 1 tablet (100 mcg total) by mouth daily. 90 tablet 3  . losartan (COZAAR) 50 MG tablet TAKE 1 TABLET(50 MG) BY MOUTH DAILY 90 tablet 1  . metoprolol succinate (TOPROL-XL) 25 MG 24 hr tablet TAKE 1 TABLET BY MOUTH EVERY DAY 90 tablet 2  . Multiple Vitamin (MULTIVITAMIN) tablet Take 1 tablet by mouth daily.    . nitroGLYCERIN  (NITROSTAT) 0.4 MG SL tablet DISSOLVE 1 TABLET UNDER THE TONGUE EVERY 5 MINUTES AS NEEDED FOR CHEST PAIN 25 tablet 3  . Omega-3 Fatty Acids (FISH OIL PO) Take 1 capsule by mouth daily.    Marland Kitchen oxybutynin (DITROPAN-XL) 5 MG 24 hr tablet Take 1 tablet (5 mg total) by mouth at bedtime. 30 tablet 3  . oxyCODONE-acetaminophen (PERCOCET) 10-325 MG tablet TK 1 T PO  Q 6 H PRF CHRONIC PAIN  0  . rosuvastatin (CRESTOR) 40 MG tablet Take 1 tablet (40 mg total) by mouth daily. 90 tablet 1  . polyethylene glycol-electrolytes (TRILYTE) 420 g solution Take 4,000 mLs by mouth as directed. 4000 mL 0  No current facility-administered medications for this visit.    Allergies as of 02/12/2020 - Review Complete 02/12/2020  Allergen Reaction Noted  . Gabapentin Other (See Comments) 06/23/2016  . Meperidine hcl Nausea And Vomiting 12/09/2007  . Niacin Other (See Comments) 03/14/2010  . Propoxyphene n-acetaminophen Nausea And Vomiting 09/05/2008  . Hydrocodone-acetaminophen Nausea And Vomiting 06/16/2012    Family History  Problem Relation Age of Onset  . Heart attack Mother   . Heart attack Father   . Hypertension Sister   . Heart attack Sister   . Cancer Brother        Renal cell   . Hepatitis Daughter   . Drug abuse Daughter   . Hypertension Daughter   . Colon cancer Neg Hx     Social History   Socioeconomic History  . Marital status: Single    Spouse name: Not on file  . Number of children: 3  . Years of education: Not on file  . Highest education level: GED or equivalent  Occupational History  . Not on file  Tobacco Use  . Smoking status: Current Every Day Smoker    Packs/day: 0.25    Years: 50.00    Pack years: 12.50    Types: Cigarettes  . Smokeless tobacco: Never Used  . Tobacco comment: smokes about 10 a day   Substance and Sexual Activity  . Alcohol use: No    Alcohol/week: 0.0 standard drinks  . Drug use: No  . Sexual activity: Never    Birth control/protection: Surgical   Other Topics Concern  . Not on file  Social History Narrative  . Not on file   Social Determinants of Health   Financial Resource Strain:   . Difficulty of Paying Living Expenses:   Food Insecurity:   . Worried About Charity fundraiser in the Last Year:   . Arboriculturist in the Last Year:   Transportation Needs:   . Film/video editor (Medical):   Marland Kitchen Lack of Transportation (Non-Medical):   Physical Activity:   . Days of Exercise per Week:   . Minutes of Exercise per Session:   Stress:   . Feeling of Stress :   Social Connections:   . Frequency of Communication with Friends and Family:   . Frequency of Social Gatherings with Friends and Family:   . Attends Religious Services:   . Active Member of Clubs or Organizations:   . Attends Archivist Meetings:   Marland Kitchen Marital Status:   Intimate Partner Violence:   . Fear of Current or Ex-Partner:   . Emotionally Abused:   Marland Kitchen Physically Abused:   . Sexually Abused:     Review of Systems: Gen: Denies any fever, chills, lightheadedness, dizziness, presyncope, syncope.  She does get headaches intermittently but none at this time.  Denies blurry vision. CV: Denies heart palpitations. Has angina occasionally which is chronic.  Hasn't had to use nitroglycerine at least in the last 6 months.  Saw cardiology last month with plans to continue medical management and follow along. Resp: Denies shortness of breath or cough.  GI: See HPI. GU : Admits to some urinary incontinence.  MS: See HPI Derm: See HPI Psych: Medications work pretty well for depression and anxiety.  Heme: Denies bruising or bleeding  Physical Exam: BP (!) 160/80 (BP Location: Left Arm)   Pulse 60   Temp 97.6 F (36.4 C) (Oral)   Ht '5\' 5"'$  (1.651 m)   Wt 196 lb  12.8 oz (89.3 kg)   BMI 32.75 kg/m  General:   Alert and oriented. Pleasant and cooperative. Well-nourished and well-developed.  Head:  Normocephalic and atraumatic. Eyes:  Without icterus,  sclera clear and conjunctiva pink.  Ears:  Normal auditory acuity. Lungs:  Clear to auscultation bilaterally. No wheezes, rales, or rhonchi. No distress.  Heart:  S1, S2 present without murmurs appreciated.  Abdomen:  +BS, soft, non-tender and non-distended. No HSM noted. No guarding or rebound. No masses appreciated.  Rectal:  Deferred  Msk:  Symmetrical without gross deformities. Normal posture. Extremities:  Without edema. Neurologic:  Alert and  oriented x4;  grossly normal neurologically. Skin:  Intact without significant lesions or rashes. Psych: Normal mood and affect.

## 2020-02-12 ENCOUNTER — Encounter: Payer: Self-pay | Admitting: Gastroenterology

## 2020-02-12 ENCOUNTER — Ambulatory Visit (INDEPENDENT_AMBULATORY_CARE_PROVIDER_SITE_OTHER): Payer: Medicare HMO | Admitting: Gastroenterology

## 2020-02-12 ENCOUNTER — Telehealth: Payer: Self-pay

## 2020-02-12 ENCOUNTER — Other Ambulatory Visit: Payer: Self-pay

## 2020-02-12 DIAGNOSIS — R195 Other fecal abnormalities: Secondary | ICD-10-CM

## 2020-02-12 DIAGNOSIS — K5900 Constipation, unspecified: Secondary | ICD-10-CM

## 2020-02-12 MED ORDER — PEG 3350-KCL-NA BICARB-NACL 420 G PO SOLR: 4000.0000 mL | ORAL | 0 refills | Status: DC

## 2020-02-12 NOTE — Assessment & Plan Note (Signed)
Chronic mild intermittent constipation likely secondary to chronic pain medication.  Currently with BMs daily.  50% of the time, stools are soft and formed, other times, stools are hard with straining.  Also with some incomplete evacuation.  Not currently on any medications to help with bowel regularity.  No alarm symptoms.  She did recently have a positive Cologuard test completed on 01/16/2020.  Last colonoscopy in 2007 was reported to be normal.  We are planning to update colonoscopy as discussed below.   For constipation, she was advised to add Benefiber or Metamucil daily.  If this does not help with bowel regularity, she was advised to add MiraLAX 1 capful (17 g) in 8 ounces of water daily.  May decrease frequency if she develops loose stools.  Also advised to increase her daily fruit and vegetable intake as well as daily water intake.   Follow-up after colonoscopy.

## 2020-02-12 NOTE — Patient Instructions (Addendum)
We will get you scheduled for a colonoscopy in the near future with Dr. Gala Romney.  For constipation: Add Benefiber or Metamucil daily.  These are fiber supplements.  If addition of Benefiber or Metamucil does not help produce regular soft, formed stools with sensation of complete evacuation, I recommend you add MiraLAX 1 capful (17 g) in 8 ounces of water daily. You may decrease frequency of MiraLAX if you develop frequent loose stools. Try increasing your daily fruit and vegetable intake. I recommend you increase your water intake as well. It is important that your bowels are moving well prior to your colonoscopy to ensure you have a good colon prep.  Keep a close eye on your blood pressure.  I recommend you following up with your PCP very soon to evaluate whether or not you need adjustments in your medications.  Please also try to limit Goody powders as well as other NSAIDs including Advil, Aleve, ibuprofen as these can cause inflammation in your stomach and predispose you to developing ulcers.  We will see you back after your colonoscopy.   Aliene Altes, PA-C [facility]

## 2020-02-12 NOTE — Progress Notes (Signed)
Cc'ed to pcp °

## 2020-02-12 NOTE — Telephone Encounter (Signed)
PA for TCS submitted via [employer] website. Case approved. [employer]# [id number], valid 04/29/20-05/29/20.

## 2020-02-12 NOTE — Assessment & Plan Note (Addendum)
74 year old female with history of CAD s/p CABG in 2014, HTN, HLD, CKD, type II DM (not on any medications currently), uterine cancer s/p hysterectomy, GERD, anxiety, and depression presenting to discuss scheduling colonoscopy due to positive Cologuard completed on 01/16/2020.  Hemoglobin 12 in November 2020.  Last colonoscopy completed in 2007 with Dr. Irving Shows and was reported to be normal.  Full report is not in our system.  Patient denies history of colon polyps, bright red blood per rectum, melena, unintentional weight loss, abdominal pain.  She does report chronic intermittent constipation in the setting of chronic pain medications.  Not currently taking anything to help with bowel regularity.  No family history of colon cancer.  Proceed with TCS with propofol with Dr. Gala Romney in the near future for further evaluation of positive Cologuard. The risks, benefits, and alternatives have been discussed in detail with patient. They have stated understanding and desire to proceed.  Propofol due to polypharmacy. Add Benefiber or Metamucil daily. If no improvement in constipation with daily fiber supplement, add MiraLAX 1 capful (17g) in 8 ounces of water daily.  May decrease frequency if she develops loose stools. Also advised to increase daily fruit and vegetable intake as well as daily water intake. Follow-up after colonoscopy.

## 2020-02-16 ENCOUNTER — Ambulatory Visit (INDEPENDENT_AMBULATORY_CARE_PROVIDER_SITE_OTHER): Payer: Medicare HMO | Admitting: Family Medicine

## 2020-02-16 ENCOUNTER — Encounter: Payer: Self-pay | Admitting: Family Medicine

## 2020-02-16 ENCOUNTER — Other Ambulatory Visit: Payer: Self-pay

## 2020-02-16 VITALS — BP 149/72 | HR 65 | Temp 97.8°F | Ht 65.0 in | Wt 197.0 lb

## 2020-02-16 DIAGNOSIS — N183 Chronic kidney disease, stage 3 unspecified: Secondary | ICD-10-CM | POA: Diagnosis not present

## 2020-02-16 DIAGNOSIS — N39498 Other specified urinary incontinence: Secondary | ICD-10-CM

## 2020-02-16 DIAGNOSIS — E1121 Type 2 diabetes mellitus with diabetic nephropathy: Secondary | ICD-10-CM | POA: Diagnosis not present

## 2020-02-16 DIAGNOSIS — F418 Other specified anxiety disorders: Secondary | ICD-10-CM

## 2020-02-16 DIAGNOSIS — I10 Essential (primary) hypertension: Secondary | ICD-10-CM

## 2020-02-16 MED ORDER — LOSARTAN POTASSIUM 100 MG PO TABS: 100.0000 mg | ORAL_TABLET | Freq: Every day | ORAL | 2 refills | Status: DC

## 2020-02-16 MED ORDER — OXYBUTYNIN CHLORIDE ER 5 MG PO TB24: 5.0000 mg | ORAL_TABLET | Freq: Every day | ORAL | 3 refills | Status: DC

## 2020-02-16 NOTE — Assessment & Plan Note (Signed)
Extremely high elevation in her GAD, she reports that she needs her Klonopin.  Will reassess this at next visit as Dr. Moshe Cipro would like to change her to BuSpar secondary to chronic pain management and history of multiple falls and accidents.

## 2020-02-16 NOTE — Patient Instructions (Signed)
I appreciate the opportunity to provide you with care for your health and wellness. Today we discussed: BP and recent labs, anxiety   Follow up: 4-6 weeks   No labs or referrals today  Changes: start taking 100 mg of Losartan daily   Please continue to practice social distancing to keep you, your family, and our community safe.  If you must go out, please wear a mask and practice good handwashing.  It was a pleasure to see you and I look forward to continuing to work together on your health and well-being. Please do not hesitate to call the office if you need care or have questions about your care.  Have a wonderful day and week. With Gratitude, Cherly Beach, DNP, AGNP-BC

## 2020-02-16 NOTE — Assessment & Plan Note (Signed)
Increasing Cozaar 100 mg from 50 mg.  As her systolic pressure continues to remain 140s or higher.  She is willing to do this adjustment to make sure that her kidney function is preserved. She reports that she is trying to eat a better diet to avoid salt.  Could do a little bit better with this.  Encouraged her to do some form of exercise 30 to 60 minutes at least 5 days a week.  Close follow-up to see how she is doing with the change in blood pressure medicine she is advised to call if she has any changes such as blood pressure going under 100, feelings of dizziness or chest pain or discomfort.

## 2020-02-16 NOTE — Assessment & Plan Note (Signed)
Uncontrolled, is taking Ditropan.  If we can get blood pressure better controlled possibly will be able to reduce her from being on hydrochlorothiazide which contributes to worsening symptoms of uncontrolled bladder/incontinence.

## 2020-02-16 NOTE — Assessment & Plan Note (Signed)
Needs to get updated labs.  Will be checking these today.

## 2020-02-16 NOTE — Progress Notes (Signed)
Subjective:  Patient ID: Tammy Suarez, female    DOB: 1946/03/11  Age: 74 y.o. MRN: [id number]  CC:  Chief Complaint  Patient presents with  . Anxiety    follow up on anxiety about the same feels like a lot of health problems are hitting her at once       HPI  HPI  Tammy Suarez is a 74 year old female patient who presents today for anxiety follow-up.  Recent positive [place] rectal cancer screening with Cologuard back in the beginning of March in addition to her kidney function changing.  Reports she takes all her medications without any issues.  Reports that her Cymbalta is helping her that she thinks.  Even though she has uncontrolled anxiety she also thinks that her Klonopin is helpful to.  She does not want to change any medications at this time and feels that there is nothing that Dr. Moshe Cipro or I can do for her currently.  She denies having any issues or concerns to discuss today in the office.   Today patient denies signs and symptoms of COVID 19 infection including fever, chills, cough, shortness of breath, and headache. Past Medical, Surgical, Social History, Allergies, and Medications have been Reviewed.   Past Medical History:  Diagnosis Date  . Annual physical exam 05/31/2016  . Arthritis   . Carpal tunnel syndrome    bilaterally  . Chronic back pain   . CKD (chronic kidney disease) stage 3, GFR 30-59 ml/min   . Coronary atherosclerosis of native coronary artery    Multivessel status post CABG - LIMA to LAD, SVG to OM and SVG to PDA  . Depression   . Economic stress 04/25/2019  . Essential hypertension   . GERD (gastroesophageal reflux disease)   . Headache   . History of conjunctivitis   . History of hiatal hernia   . Hyperlipidemia   . Hypothyroidism   . Myocardial infarction ([place])   . Neck pain 01/11/2017  . Obesity   . Shoulder pain, left 04/22/2017  . Type 2 diabetes mellitus ([place])   . Uterine cancer ([place])   . Vitamin B 12 deficiency     Current  Meds  Medication Sig  . ACCU-CHEK AVIVA PLUS test strip AS DIRECTED  . ACCU-CHEK FASTCLIX LANCETS MISC AS DIRECTED  . alendronate (FOSAMAX) 70 MG tablet Take 1 tablet (70 mg total) by mouth every 7 (seven) days. Take with a full glass of water on an empty stomach.  Marland Kitchen aspirin EC 81 MG tablet Take 81 mg by mouth daily.  . Aspirin-Acetaminophen-Caffeine (GOODY HEADACHE PO) Take 1 Package by mouth 2 (two) times daily as needed (headache).  . blood glucose meter kit and supplies Dispense based on patient and insurance preference. Once daily testing dx e11.9  . clonazePAM (KLONOPIN) 0.5 MG tablet Take one tablet by mouth three times daily for anxiety  . DULoxetine (CYMBALTA) 60 MG capsule TAKE 1 CAPSULE BY MOUTH TWICE DAILY  . ezetimibe (ZETIA) 10 MG tablet Take 1 tablet (10 mg total) by mouth daily.  . famotidine (PEPCID) 20 MG tablet Take 1 tablet (20 mg total) by mouth 2 (two) times daily. (Patient taking differently: Take 20 mg by mouth 2 (two) times daily. Taking daily.)  . fenofibrate (TRICOR) 145 MG tablet TAKE 1 TABLET BY MOUTH EVERY DAY  . isosorbide mononitrate (IMDUR) 30 MG 24 hr tablet TAKE 1 TABLET(30 MG) BY MOUTH AT BEDTIME  . levothyroxine (SYNTHROID) 100 MCG tablet Take 1 tablet (  100 mcg total) by mouth daily.  . losartan (COZAAR) 100 MG tablet Take 1 tablet (100 mg total) by mouth daily.  . metoprolol succinate (TOPROL-XL) 25 MG 24 hr tablet TAKE 1 TABLET BY MOUTH EVERY DAY  . Multiple Vitamin (MULTIVITAMIN) tablet Take 1 tablet by mouth daily.  . nitroGLYCERIN (NITROSTAT) 0.4 MG SL tablet DISSOLVE 1 TABLET UNDER THE TONGUE EVERY 5 MINUTES AS NEEDED FOR CHEST PAIN  . Omega-3 Fatty Acids (FISH OIL PO) Take 1 capsule by mouth daily.  . oxybutynin (DITROPAN-XL) 5 MG 24 hr tablet Take 1 tablet (5 mg total) by mouth at bedtime.  . oxyCODONE-acetaminophen (PERCOCET) 10-325 MG tablet TK 1 T PO  Q 6 H PRF CHRONIC PAIN  . rosuvastatin (CRESTOR) 40 MG tablet Take 1 tablet (40 mg total) by  mouth daily.  . [DISCONTINUED] losartan (COZAAR) 50 MG tablet TAKE 1 TABLET(50 MG) BY MOUTH DAILY  . [DISCONTINUED] oxybutynin (DITROPAN-XL) 5 MG 24 hr tablet Take 1 tablet (5 mg total) by mouth at bedtime.    ROS:  Review of Systems  Constitutional: Negative.   HENT: Negative.   Eyes: Negative.   Respiratory: Negative.   Cardiovascular: Negative.   Gastrointestinal: Negative.   Genitourinary: Negative.   Musculoskeletal: Negative.   Skin: Negative.   Neurological: Negative.   Endo/Heme/Allergies: Negative.   Psychiatric/Behavioral: Positive for depression. The patient is nervous/anxious.   All other systems reviewed and are negative.    Objective:   Today's Vitals: BP (!) 149/72 (BP Location: Left Arm, Patient Position: Sitting, Cuff Size: Normal)   Pulse 65   Temp 97.8 F (36.6 C) (Temporal)   Ht 5' 5" (1.651 m)   Wt 197 lb (89.4 kg)   SpO2 95%   BMI 32.78 kg/m  Vitals with BMI 02/16/2020 02/12/2020 02/12/2020  Height 5' 5" - 5' 5"  Weight 197 lbs - 196 lbs 13 oz  BMI 32.78 - 32.75  Systolic 149 160 189  Diastolic 72 80 79  Pulse 65 - 60     Physical Exam Vitals and nursing note reviewed.  Constitutional:      Appearance: Normal appearance. She is well-developed and well-groomed. She is obese.  HENT:     Head: Normocephalic and atraumatic.     Right Ear: External ear normal.     Left Ear: External ear normal.     Mouth/Throat:     Comments: Mask in place Eyes:     General:        Right eye: No discharge.        Left eye: No discharge.     Conjunctiva/sclera: Conjunctivae normal.  Cardiovascular:     Rate and Rhythm: Normal rate and regular rhythm.     Pulses: Normal pulses.     Heart sounds: Normal heart sounds.  Pulmonary:     Effort: Pulmonary effort is normal.     Breath sounds: Normal breath sounds.  Musculoskeletal:        General: Normal range of motion.     Cervical back: Normal range of motion and neck supple.  Skin:    General: Skin is warm.   Neurological:     General: No focal deficit present.     Mental Status: She is alert and oriented to person, place, and time.  Psychiatric:        Attention and Perception: Attention normal.        Mood and Affect: Mood is anxious. Affect is flat.          Speech: Speech normal.        Behavior: Behavior is withdrawn. Behavior is cooperative.        Thought Content: Thought content normal.        Cognition and Memory: Cognition normal.        Judgment: Judgment normal.      GAD 7 : Generalized Anxiety Score 02/16/2020 09/13/2018 08/29/2018 07/27/2018  Nervous, Anxious, on Edge 3 0 1 1  Control/stop worrying 1 0 0 1  Worry too much - different things 1 0 1 0  Trouble relaxing 3 0 1 1  Restless 3 0 0 0  Easily annoyed or irritable 2 0 0 1  Afraid - awful might happen 1 0 1 0  Total GAD 7 Score 14 0 4 4  Anxiety Difficulty Not difficult at all - - Not difficult at all    Depression screen [facility] 2/9 02/16/2020 12/21/2019 08/24/2019  Decreased Interest 0 2 2  Down, Depressed, Hopeless 0 1 3  PHQ - 2 Score 0 3 5  Altered sleeping 2 0 2  Tired, decreased energy _0 Change in appetite 0 0 1  Feeling bad or failure about yourself  0 0 1  Trouble concentrating 1 0 2  Moving slowly or fidgety/restless 0 0 2  Suicidal thoughts 1 0 1  PHQ-9 Score _1 Difficult doing work/chores Somewhat difficult Somewhat difficult Very difficult  Some recent data might be hidden       Assessment   1. Type 2 diabetes mellitus with diabetic nephropathy, without long-term current use of insulin ([place])   2. Essential hypertension, benign   3. Other urinary incontinence   4. Stage 3 chronic kidney disease, unspecified whether stage 3a or 3b CKD   5. Depression with anxiety     Tests ordered No orders of the defined types were placed in this encounter.      Plan: Please see assessment and plan per problem list above.   Meds ordered this encounter  Medications  . losartan (COZAAR) 100 MG  tablet    Sig: Take 1 tablet (100 mg total) by mouth daily.    Dispense:  30 tablet    Refill:  2    Order Specific Question:   Supervising Provider    Answer:   SIMPSON, MARGARET E [[id number]]  . oxybutynin (DITROPAN-XL) 5 MG 24 hr tablet    Sig: Take 1 tablet (5 mg total) by mouth at bedtime.    Dispense:  30 tablet    Refill:  3    Please dispense oxybutynin chloride ER 5 mg once daily Cancel vesicare prescription as not covered    Order Specific Question:   Supervising Provider    Answer:   Jacklynn Bue    Patient to follow-up in 03/22/2020   Perlie Mayo, NP

## 2020-02-16 NOTE — Assessment & Plan Note (Signed)
Getting updated labs to assess kidney function.  Encouraged her to make sure that she maintains good control of her diabetes and her blood pressure.  Increasing her Cozaar to 100 mg to see how she does to get her blood pressure in a better range.  For kidney preservation

## 2020-02-17 LAB — TSH: TSH: 0.08 mIU/L — ABNORMAL LOW (ref 0.40–4.50)

## 2020-02-18 ENCOUNTER — Other Ambulatory Visit: Payer: Self-pay | Admitting: Family Medicine

## 2020-02-21 ENCOUNTER — Telehealth: Payer: Self-pay | Admitting: Family Medicine

## 2020-02-21 ENCOUNTER — Other Ambulatory Visit: Payer: Self-pay | Admitting: Family Medicine

## 2020-02-21 DIAGNOSIS — R52 Pain, unspecified: Secondary | ICD-10-CM

## 2020-02-21 MED ORDER — CLONAZEPAM 0.5 MG PO TABS: 0.5000 mg | ORAL_TABLET | Freq: Three times a day (TID) | ORAL | 0 refills | Status: DC | PRN

## 2020-02-21 NOTE — Telephone Encounter (Signed)
Pt requesting refill

## 2020-02-21 NOTE — Telephone Encounter (Signed)
30 day supply of klonopin prescribed , has appt with Jarrett Soho in 30 days for depression and anxiety , follow up so I will not be prescribing the klonopin moving forward . She NEEDS a UDS as on chronic pain meds, from pain clinic also, pls test for the klonopin and her pain medication , pls arrange that in the next 1  To 2 weeks. I am messaging Jarrett Soho about this also so it can be a coordinated effort ?//concerns , please ask!

## 2020-02-21 NOTE — Telephone Encounter (Signed)
Pt has return appt with you in May for depression/ anxiety. I have filled a 30 day supply of klonopin with no refills unless you continue them' Concerns are not new, she has h /o falls, and does take  Chronic pain meds through a pain clinic. Using alterantive to benzo for her anxiety is optimal, I have tried in the past and if at all able to  taper / switch from the  Benzo is best ??/ concerns , please send a message I have asked for UDS in next 1 to 2 weeks also

## 2020-02-23 ENCOUNTER — Other Ambulatory Visit: Payer: Self-pay | Admitting: Family Medicine

## 2020-02-26 ENCOUNTER — Other Ambulatory Visit: Payer: Self-pay | Admitting: *Deleted

## 2020-02-26 MED ORDER — DULOXETINE HCL 60 MG PO CPEP: 60.0000 mg | ORAL_CAPSULE | Freq: Two times a day (BID) | ORAL | 2 refills | Status: DC

## 2020-02-26 NOTE — Addendum Note (Signed)
Addended by: Eual Fines on: 02/26/2020 07:58 PM   Modules accepted: Orders

## 2020-03-04 ENCOUNTER — Other Ambulatory Visit: Payer: Self-pay

## 2020-03-04 DIAGNOSIS — E038 Other specified hypothyroidism: Secondary | ICD-10-CM

## 2020-03-18 ENCOUNTER — Other Ambulatory Visit: Payer: Self-pay | Admitting: Family Medicine

## 2020-03-22 ENCOUNTER — Encounter: Payer: Self-pay | Admitting: Family Medicine

## 2020-03-22 ENCOUNTER — Other Ambulatory Visit: Payer: Self-pay

## 2020-03-22 ENCOUNTER — Telehealth (INDEPENDENT_AMBULATORY_CARE_PROVIDER_SITE_OTHER): Payer: Medicare HMO | Admitting: Family Medicine

## 2020-03-22 VITALS — BP 149/72 | Ht 65.0 in | Wt 185.0 lb

## 2020-03-22 DIAGNOSIS — F418 Other specified anxiety disorders: Secondary | ICD-10-CM

## 2020-03-22 DIAGNOSIS — I10 Essential (primary) hypertension: Secondary | ICD-10-CM | POA: Diagnosis not present

## 2020-03-22 MED ORDER — BUSPIRONE HCL 10 MG PO TABS: 10.0000 mg | ORAL_TABLET | Freq: Three times a day (TID) | ORAL | 2 refills | Status: DC

## 2020-03-22 NOTE — Patient Instructions (Signed)
I appreciate the opportunity to provide you with care for your health and wellness. Today we discussed: Anxiety and blood pressure  Follow up: 3 months  No labs or referrals today  Continue taking medications as directed. Glad blood pressure is doing well.  Use Buspar 3 times daily for anxiety.   Please continue to practice social distancing to keep you, your family, and our community safe.  If you must go out, please wear a mask and practice good handwashing.  It was a pleasure to see you and I look forward to continuing to work together on your health and well-being. Please do not hesitate to call the office if you need care or have questions about your care.  Have a wonderful day and week. With Gratitude, Cherly Beach, DNP, AGNP-BC

## 2020-03-22 NOTE — Assessment & Plan Note (Signed)
Changed from Klonopin to BuSpar she is on chronic pain medicine as well.  Has a history of multiple falls and accidents.  She was very reluctant to this change.  But with encouragement in conversation she was willing to try.  Provided with education of side effects of BuSpar.  Patient verbalized understanding of treatment plan.

## 2020-03-22 NOTE — Progress Notes (Signed)
Virtual Visit via Telephone Note   This visit type was conducted due to national recommendations for restrictions regarding the COVID-19 Pandemic (e.g. social distancing) in an effort to limit this patient's exposure and mitigate transmission in our community.  Due to her co-morbid illnesses, this patient is at least at moderate risk for complications without adequate follow up.  This format is felt to be most appropriate for this patient at this time.  The patient did not have access to video technology/had technical difficulties with video requiring transitioning to audio format only (telephone).  All issues noted in this document were discussed and addressed.  No physical exam could be performed with this format.    Evaluation Performed:  Follow-up visit  Date:  03/22/2020   ID:  Tammy Suarez, DOB 12-19-1945, MRN [id number]  Patient Location: Home Provider Location: Office  Location of Patient: Home Location of Provider: Telehealth Consent was obtain for visit to be over via telehealth. I verified that I am speaking with the correct person using two identifiers.  PCP:  Fayrene Helper, MD   Chief Complaint:  Blood pressure and anxiety   History of Present Illness:    Tammy Suarez is a 74 y.o. female with history of arthritis, carpal tunnel, hypertension, GERD, headache, hyperlipidemia, hypothyroidism, obesity, type 2 diabetes.  Recently increased 200 mg of Cozaar blood pressure is well controlled at home she reports 120s over the 70s to 80s.  She denies having any headaches, vision changes, dizziness, chest pain, shortness of breath, leg swelling or any other signs or symptoms of high blood pressure.  Is on Klonopin 3 times a day discussion of anxiety and use of Klonopin and secondary to having history of falls.  Advised that it would be best to take her off of Klonopin and put her on a safer medication that she can use several times a day such as BuSpar she is very  reluctant to this change but is open to trying it.  The patient does not have symptoms concerning for COVID-19 infection (fever, chills, cough, or new shortness of breath).   Past Medical, Surgical, Social History, Allergies, and Medications have been Reviewed.  Past Medical History:  Diagnosis Date  . Annual physical exam 05/31/2016  . Arthritis   . Carpal tunnel syndrome    bilaterally  . Chronic back pain   . CKD (chronic kidney disease) stage 3, GFR 30-59 ml/min   . Coronary atherosclerosis of native coronary artery    Multivessel status post CABG - LIMA to LAD, SVG to OM and SVG to PDA  . Depression   . Economic stress 04/25/2019  . Essential hypertension   . GERD (gastroesophageal reflux disease)   . Headache   . History of conjunctivitis   . History of hiatal hernia   . Hyperlipidemia   . Hypothyroidism   . Myocardial infarction ([place])   . Neck pain 01/11/2017  . Obesity   . Shoulder pain, left 04/22/2017  . Type 2 diabetes mellitus ([place])   . Uterine cancer ([place])   . Vitamin B 12 deficiency    Past Surgical History:  Procedure Laterality Date  . ABDOMINAL HYSTERECTOMY    . BACK SURGERY    . CARDIAC SURGERY    . CARPAL TUNNEL RELEASE Left 07/23/2016   Procedure: CARPAL TUNNEL RELEASE;  Surgeon: Carole Civil, MD;  Location: [facility];  Service: Orthopedics;  Laterality: Left;  . CERVICAL LAMINECTOMY    . CHOLECYSTECTOMY    .  COLONOSCOPY  2007   Dr. Irving Shows: reported to be normal  . CORONARY ARTERY BYPASS GRAFT N/A 11/02/2013   Procedure: CORONARY ARTERY BYPASS GRAFTING (CABG);  Surgeon: Gaye Pollack, MD;  Location: [place];  Service: Open Heart Surgery;  Laterality: N/A;  CABG x three, using left internal mammary artery and right leg greater saphenous vein harvested endoscopically  . COSMETIC SURGERY  07/01/2012   Recurrent skin infection on lower abdomen, [facility]  . HERNIA REPAIR    . Inguinal herniorrhaphy    . INTRAOPERATIVE TRANSESOPHAGEAL ECHOCARDIOGRAM N/A  11/02/2013   Procedure: INTRAOPERATIVE TRANSESOPHAGEAL ECHOCARDIOGRAM;  Surgeon: Gaye Pollack, MD;  Location: [facility];  Service: Open Heart Surgery;  Laterality: N/A;  . LAPAROSCOPIC GASTRIC BANDING  2009  . LEFT HEART CATHETERIZATION WITH CORONARY ANGIOGRAM N/A 10/30/2013   Procedure: LEFT HEART CATHETERIZATION WITH CORONARY ANGIOGRAM;  Surgeon: Minus Breeding, MD;  Location[facility];  Service: Cardiovascular;  Laterality: N/A;  . Left index finger repair    . LUMBAR FUSION    . SPINE SURGERY    . VESICOVAGINAL FISTULA CLOSURE W/ TAH       Current Meds  Medication Sig  . ACCU-CHEK AVIVA PLUS test strip AS DIRECTED  . ACCU-CHEK FASTCLIX LANCETS MISC AS DIRECTED  . alendronate (FOSAMAX) 70 MG tablet Take 1 tablet (70 mg total) by mouth every 7 (seven) days. Take with a full glass of water on an empty stomach.  Marland Kitchen aspirin EC 81 MG tablet Take 81 mg by mouth daily.  . Aspirin-Acetaminophen-Caffeine (GOODY HEADACHE PO) Take 1 Package by mouth 2 (two) times daily as needed (headache).  . blood glucose meter kit and supplies Dispense based on patient and insurance preference. Once daily testing dx e11.9  . calcium carbonate (OSCAL) 1500 (600 Ca) MG TABS tablet Take by mouth.  . Cholecalciferol 1.25 MG (50000 UT) TABS Take by mouth.  . clonazePAM (KLONOPIN) 0.5 MG tablet Take one tablet by mouth three times daily for anxiety  . clonazePAM (KLONOPIN) 0.5 MG tablet Take 1 tablet (0.5 mg total) by mouth 3 (three) times daily as needed for anxiety.  . DULoxetine (CYMBALTA) 60 MG capsule Take 1 capsule (60 mg total) by mouth 2 (two) times daily.  Marland Kitchen ezetimibe (ZETIA) 10 MG tablet Take 1 tablet (10 mg total) by mouth daily.  . famotidine (PEPCID) 20 MG tablet Take 1 tablet (20 mg total) by mouth 2 (two) times daily. (Patient taking differently: Take 20 mg by mouth 2 (two) times daily. Taking daily.)  . fenofibrate (TRICOR) 145 MG tablet TAKE 1 TABLET BY MOUTH EVERY DAY  . hydrochlorothiazide  (MICROZIDE) 12.5 MG capsule TAKE 1 CAPSULE(12.5 MG) BY MOUTH DAILY  . isosorbide mononitrate (IMDUR) 30 MG 24 hr tablet TAKE 1 TABLET(30 MG) BY MOUTH AT BEDTIME  . levothyroxine (SYNTHROID) 100 MCG tablet Take 1 tablet (100 mcg total) by mouth daily.  Marland Kitchen losartan (COZAAR) 100 MG tablet Take 1 tablet (100 mg total) by mouth daily.  . metoprolol succinate (TOPROL-XL) 25 MG 24 hr tablet TAKE 1 TABLET BY MOUTH EVERY DAY  . Multiple Vitamin (MULTIVITAMIN) tablet Take 1 tablet by mouth daily.  . nitroGLYCERIN (NITROSTAT) 0.4 MG SL tablet DISSOLVE 1 TABLET UNDER THE TONGUE EVERY 5 MINUTES AS NEEDED FOR CHEST PAIN  . Omega-3 Fatty Acids (FISH OIL PO) Take 1 capsule by mouth daily.  Marland Kitchen oxybutynin (DITROPAN-XL) 5 MG 24 hr tablet Take 1 tablet (5 mg total) by mouth at bedtime.  Marland Kitchen oxyCODONE-acetaminophen (PERCOCET)  10-325 MG tablet TK 1 T PO  Q 6 H PRF CHRONIC PAIN  . polyethylene glycol-electrolytes (TRILYTE) 420 g solution Take 4,000 mLs by mouth as directed.  . rosuvastatin (CRESTOR) 40 MG tablet Take 1 tablet (40 mg total) by mouth daily.     Allergies:   Gabapentin, Meperidine hcl, Niacin, Propoxyphene n-acetaminophen, and Hydrocodone-acetaminophen   ROS:   Please see the history of present illness.    All other systems reviewed and are negative.   Labs/Other Tests and Data Reviewed:    Recent Labs: 10/05/2019: Hemoglobin 12.0; Platelets 155 12/25/2019: ALT 10; BUN 55; Creat 2.04; Potassium 5.8; Sodium 137 02/16/2020: TSH 0.08   Recent Lipid Panel Lab Results  Component Value Date/Time   CHOL 93 12/25/2019 08:59 AM   TRIG 160 (H) 12/25/2019 08:59 AM   HDL 18 (L) 12/25/2019 08:59 AM   CHOLHDL 5.2 (H) 12/25/2019 08:59 AM   LDLCALC 51 12/25/2019 08:59 AM    Wt Readings from Last 3 Encounters:  03/22/20 185 lb (83.9 kg)  02/16/20 197 lb (89.4 kg)  02/12/20 196 lb 12.8 oz (89.3 kg)     Objective:    Vital Signs:  BP (!) 149/72   Ht '5\' 5"'$  (1.651 m)   Wt 185 lb (83.9 kg)   BMI 30.79  kg/m    VITAL SIGNS:  reviewed GEN:  alert and oriented RESPIRATORY:  no shortness of breath in conversation  PSYCH:  flat and withdrawn affect   GAD 7 : Generalized Anxiety Score 03/22/2020 02/16/2020 09/13/2018 08/29/2018  Nervous, Anxious, on Edge 2 3 0 1  Control/stop worrying 1 1 0 0  Worry too much - different things 2 1 0 1  Trouble relaxing 3 3 0 1  Restless 2 3 0 0  Easily annoyed or irritable 3 2 0 0  Afraid - awful might happen 0 1 0 1  Total GAD 7 Score 13 14 0 4  Anxiety Difficulty Somewhat difficult Not difficult at all - -      ASSESSMENT & PLAN:    1. Essential hypertension, benign  2. Depression with anxiety  - busPIRone (BUSPAR) 10 MG tablet; Take 1 tablet (10 mg total) by mouth 3 (three) times daily.  Dispense: 90 tablet; Refill: 2   Time:   Today, I have spent 15 minutes with the patient with telehealth technology discussing the above problems.     Medication Adjustments/Labs and Tests Ordered: Current medicines are reviewed at length with the patient today.  Concerns regarding medicines are outlined above.   Tests Ordered: No orders of the defined types were placed in this encounter.   Medication Changes: No orders of the defined types were placed in this encounter.   Disposition:  Follow up 3 months  Signed, Perlie Mayo, NP  03/22/2020 10:04 AM     [place]

## 2020-03-22 NOTE — Assessment & Plan Note (Signed)
Doing well on the 100 mg of Cozaar.   We will continue this dose for now.  Follow-up in office for blood pressure check and next appointment.   She reports much encouraged to follow DASH diet and to get exercise.

## 2020-03-24 ENCOUNTER — Other Ambulatory Visit: Payer: Self-pay | Admitting: Family Medicine

## 2020-03-24 DIAGNOSIS — K219 Gastro-esophageal reflux disease without esophagitis: Secondary | ICD-10-CM

## 2020-03-30 ENCOUNTER — Other Ambulatory Visit: Payer: Self-pay | Admitting: Family Medicine

## 2020-04-03 ENCOUNTER — Other Ambulatory Visit: Payer: Self-pay | Admitting: Family Medicine

## 2020-04-05 ENCOUNTER — Encounter: Payer: Medicare HMO | Admitting: Family Medicine

## 2020-04-09 ENCOUNTER — Telehealth (INDEPENDENT_AMBULATORY_CARE_PROVIDER_SITE_OTHER): Payer: Medicare HMO

## 2020-04-09 ENCOUNTER — Other Ambulatory Visit: Payer: Self-pay

## 2020-04-09 VITALS — BP 149/72 | Ht 65.0 in | Wt 185.0 lb

## 2020-04-09 DIAGNOSIS — Z0000 Encounter for general adult medical examination without abnormal findings: Secondary | ICD-10-CM

## 2020-04-09 NOTE — Patient Instructions (Signed)
Tammy Suarez , Thank you for taking time to come for your Medicare Wellness Visit. I appreciate your ongoing commitment to your health goals. Please review the following plan we discussed and let me know if I can assist you in the future.   Screening recommendations/referrals: Colonoscopy: scheduled Mammogram: up to date  Bone Density: up to date  Recommended yearly ophthalmology/optometry visit for glaucoma screening and checkup Recommended yearly dental visit for hygiene and checkup  Vaccinations: Influenza vaccine: up to date  Pneumococcal vaccine: up to date Tdap vaccine: due in 05/2020 Shingles vaccine: qualifies- will have to get at pharmacy due to insurance      Next appointment: wellness in 1 year   Preventive Care 74 Years and Older, Female Preventive care refers to lifestyle choices and visits with your health care provider that can promote health and wellness. What does preventive care include?  A yearly physical exam. This is also called an annual well check.  Dental exams once or twice a year.  Routine eye exams. Ask your health care provider how often you should have your eyes checked.  Personal lifestyle choices, including:  Daily care of your teeth and gums.  Regular physical activity.  Eating a healthy diet.  Avoiding tobacco and drug use.  Limiting alcohol use.  Practicing safe sex.  Taking low-dose aspirin every day.  Taking vitamin and mineral supplements as recommended by your health care provider. What happens during an annual well check? The services and screenings done by your health care provider during your annual well check will depend on your age, overall health, lifestyle risk factors, and family history of disease. Counseling  Your health care provider may ask you questions about your:  Alcohol use.  Tobacco use.  Drug use.  Emotional well-being.  Home and relationship well-being.  Sexual activity.  Eating habits.  History  of falls.  Memory and ability to understand (cognition).  Work and work Statistician.  Reproductive health. Screening  You may have the following tests or measurements:  Height, weight, and BMI.  Blood pressure.  Lipid and cholesterol levels. These may be checked every 5 years, or more frequently if you are over 74 years old.  Skin check.  Lung cancer screening. You may have this screening every year starting at age 74 if you have a 30-pack-year history of smoking and currently smoke or have quit within the past 15 years.  Fecal occult blood test (FOBT) of the stool. You may have this test every year starting at age 74.  Flexible sigmoidoscopy or colonoscopy. You may have a sigmoidoscopy every 5 years or a colonoscopy every 10 years starting at age 74.  Hepatitis C blood test.  Hepatitis B blood test.  Sexually transmitted disease (STD) testing.  Diabetes screening. This is done by checking your blood sugar (glucose) after you have not eaten for a while (fasting). You may have this done every 1-3 years.  Bone density scan. This is done to screen for osteoporosis. You may have this done starting at age 74.  Mammogram. This may be done every 1-2 years. Talk to your health care provider about how often you should have regular mammograms. Talk with your health care provider about your test results, treatment options, and if necessary, the need for more tests. Vaccines  Your health care provider may recommend certain vaccines, such as:  Influenza vaccine. This is recommended every year.  Tetanus, diphtheria, and acellular pertussis (Tdap, Td) vaccine. You may need a Td booster every  10 years.  Zoster vaccine. You may need this after age 74.  Pneumococcal 13-valent conjugate (PCV13) vaccine. One dose is recommended after age 74.  Pneumococcal polysaccharide (PPSV23) vaccine. One dose is recommended after age 74. Talk to your health care provider about which screenings and  vaccines you need and how often you need them. This information is not intended to replace advice given to you by your health care provider. Make sure you discuss any questions you have with your health care provider. Document Released: 11/29/2015 Document Revised: 07/22/2016 Document Reviewed: 09/03/2015 Elsevier Interactive Patient Education  2017 [employer] Prevention in the Home Falls can cause injuries. They can happen to people of all ages. There are many things you can do to make your home safe and to help prevent falls. What can I do on the outside of my home?  Regularly fix the edges of walkways and driveways and fix any cracks.  Remove anything that might make you trip as you walk through a door, such as a raised step or threshold.  Trim any bushes or trees on the path to your home.  Use bright outdoor lighting.  Clear any walking paths of anything that might make someone trip, such as rocks or tools.  Regularly check to see if handrails are loose or broken. Make sure that both sides of any steps have handrails.  Any raised decks and porches should have guardrails on the edges.  Have any leaves, snow, or ice cleared regularly.  Use sand or salt on walking paths during winter.  Clean up any spills in your garage right away. This includes oil or grease spills. What can I do in the bathroom?  Use night lights.  Install grab bars by the toilet and in the tub and shower. Do not use towel bars as grab bars.  Use non-skid mats or decals in the tub or shower.  If you need to sit down in the shower, use a plastic, non-slip stool.  Keep the floor dry. Clean up any water that spills on the floor as soon as it happens.  Remove soap buildup in the tub or shower regularly.  Attach bath mats securely with double-sided non-slip rug tape.  Do not have throw rugs and other things on the floor that can make you trip. What can I do in the bedroom?  Use night  lights.  Make sure that you have a light by your bed that is easy to reach.  Do not use any sheets or blankets that are too big for your bed. They should not hang down onto the floor.  Have a firm chair that has side arms. You can use this for support while you get dressed.  Do not have throw rugs and other things on the floor that can make you trip. What can I do in the kitchen?  Clean up any spills right away.  Avoid walking on wet floors.  Keep items that you use a lot in easy-to-reach places.  If you need to reach something above you, use a strong step stool that has a grab bar.  Keep electrical cords out of the way.  Do not use floor polish or wax that makes floors slippery. If you must use wax, use non-skid floor wax.  Do not have throw rugs and other things on the floor that can make you trip. What can I do with my stairs?  Do not leave any items on the stairs.  Make sure  that there are handrails on both sides of the stairs and use them. Fix handrails that are broken or loose. Make sure that handrails are as long as the stairways.  Check any carpeting to make sure that it is firmly attached to the stairs. Fix any carpet that is loose or worn.  Avoid having throw rugs at the top or bottom of the stairs. If you do have throw rugs, attach them to the floor with carpet tape.  Make sure that you have a light switch at the top of the stairs and the bottom of the stairs. If you do not have them, ask someone to add them for you. What else can I do to help prevent falls?  Wear shoes that:  Do not have high heels.  Have rubber bottoms.  Are comfortable and fit you well.  Are closed at the toe. Do not wear sandals.  If you use a stepladder:  Make sure that it is fully opened. Do not climb a closed stepladder.  Make sure that both sides of the stepladder are locked into place.  Ask someone to hold it for you, if possible.  Clearly mark and make sure that you can  see:  Any grab bars or handrails.  First and last steps.  Where the edge of each step is.  Use tools that help you move around (mobility aids) if they are needed. These include:  Canes.  Walkers.  Scooters.  Crutches.  Turn on the lights when you go into a dark area. Replace any light bulbs as soon as they burn out.  Set up your furniture so you have a clear path. Avoid moving your furniture around.  If any of your floors are uneven, fix them.  If there are any pets around you, be aware of where they are.  Review your medicines with your doctor. Some medicines can make you feel dizzy. This can increase your chance of falling. Ask your doctor what other things that you can do to help prevent falls. This information is not intended to replace advice given to you by your health care provider. Make sure you discuss any questions you have with your health care provider. Document Released: 08/29/2009 Document Revised: 04/09/2016 Document Reviewed: 12/07/2014 Elsevier Interactive Patient Education  2017 [employer].

## 2020-04-09 NOTE — Progress Notes (Addendum)
Subjective:   Tammy Suarez is a 74 y.o. female who presents for Medicare Annual (Subsequent) preventive examination.   Location of Patient: Home Location of Provider: Telehealth Consent was obtain for visit to be over via telehealth.  Pt verified correct person using two identifiers.   Review of Systems:   Cardiac Risk Factors include: advanced age (>61mn, >>44women);diabetes mellitus;dyslipidemia;hypertension;obesity (BMI >30kg/m2);sedentary lifestyle;smoking/ tobacco exposure     Objective:     Vitals: BP (!) 149/72   Ht 5' 5" (1.651 m)   Wt 185 lb (83.9 kg)   BMI 30.79 kg/m   Body mass index is 30.79 kg/m.  Advanced Directives 10/04/2019 09/07/2019 07/05/2017 05/05/2017 07/23/2016 07/17/2016 06/17/2016  Does Patient Have a Medical Advance Directive? _0  No No  Would patient like information on creating a medical advance directive? No - Patient declined - Yes (MAU/Ambulatory/Procedural Areas - Information given) No - Patient declined No - patient declined information Yes - Educational materials given -  Pre-existing out of facility DNR order (yellow form or pink MOST form) - - - - - - -    Tobacco Social History   Tobacco Use  Smoking Status Current Every Day Smoker  . Packs/day: 0.25  . Years: 50.00  . Pack years: 12.50  . Types: Cigarettes  Smokeless Tobacco Never Used  Tobacco Comment   smokes about 10 a day      Ready to quit: Not Answered Counseling given: Not Answered Comment: smokes about 10 a day    Clinical Intake:     Pain : No/denies pain Pain Score: 0-No pain     Nutritional Status: BMI > 30  Obese Diabetes: Yes CBG done?: No Did pt. bring in CBG monitor from home?: No  How often do you need to have someone help you when you read instructions, pamphlets, or other written materials from your doctor or pharmacy?: 1 - Never What is the last grade level you completed in school?:   LPN  Interpreter Needed?: No      Past Medical History:  Diagnosis Date  . Annual physical exam 05/31/2016  . Arthritis   . Carpal tunnel syndrome    bilaterally  . Chronic back pain   . CKD (chronic kidney disease) stage 3, GFR 30-59 ml/min   . Coronary atherosclerosis of native coronary artery    Multivessel status post CABG - LIMA to LAD, SVG to OM and SVG to PDA  . Depression   . Economic stress 04/25/2019  . Essential hypertension   . GERD (gastroesophageal reflux disease)   . Headache   . History of conjunctivitis   . History of hiatal hernia   . Hyperlipidemia   . Hypothyroidism   . Myocardial infarction ([facility]   . Neck pain 01/11/2017  . Obesity   . Shoulder pain, left 04/22/2017  . Type 2 diabetes mellitus ([facility]   . Uterine cancer ([facility]   . Vitamin B 12 deficiency    Past Surgical History:  Procedure Laterality Date  . ABDOMINAL HYSTERECTOMY    . BACK SURGERY    . CARDIAC SURGERY    . CARPAL TUNNEL RELEASE Left 07/23/2016   Procedure: CARPAL TUNNEL RELEASE;  Surgeon: SCarole Civil MD;  Location: [facility];  Service: Orthopedics;  Laterality: Left;  . CERVICAL LAMINECTOMY    . CHOLECYSTECTOMY    . COLONOSCOPY  2007   Dr. LIrving Shows reported to be normal  . CORONARY ARTERY BYPASS GRAFT N/A 11/02/2013  Procedure: CORONARY ARTERY BYPASS GRAFTING (CABG);  Surgeon: Gaye Pollack, MD;  Location: [facility];  Service: Open Heart Surgery;  Laterality: N/A;  CABG x three, using left internal mammary artery and right leg greater saphenous vein harvested endoscopically  . COSMETIC SURGERY  07/01/2012   Recurrent skin infection on lower abdomen, [facility]  . HERNIA REPAIR    . Inguinal herniorrhaphy    . INTRAOPERATIVE TRANSESOPHAGEAL ECHOCARDIOGRAM N/A 11/02/2013   Procedure: INTRAOPERATIVE TRANSESOPHAGEAL ECHOCARDIOGRAM;  Surgeon: Gaye Pollack, MD;  Location: [facility];  Service: Open Heart Surgery;  Laterality: N/A;  . LAPAROSCOPIC GASTRIC BANDING  2009  . LEFT HEART CATHETERIZATION WITH CORONARY ANGIOGRAM N/A  10/30/2013   Procedure: LEFT HEART CATHETERIZATION WITH CORONARY ANGIOGRAM;  Surgeon: Minus Breeding, MD;  Location[facility];  Service: Cardiovascular;  Laterality: N/A;  . Left index finger repair    . LUMBAR FUSION    . SPINE SURGERY    . VESICOVAGINAL FISTULA CLOSURE W/ TAH     Family History  Problem Relation Age of Onset  . Heart attack Mother   . Heart attack Father   . Hypertension Sister   . Heart attack Sister   . Cancer Brother        Renal cell   . Hepatitis Daughter   . Drug abuse Daughter   . Hypertension Daughter   . Colon cancer Neg Hx    Social History   Socioeconomic History  . Marital status: Single    Spouse name: Not on file  . Number of children: 3  . Years of education: Not on file  . Highest education level: GED or equivalent  Occupational History  . Not on file  Tobacco Use  . Smoking status: Current Every Day Smoker    Packs/day: 0.25    Years: 50.00    Pack years: 12.50    Types: Cigarettes  . Smokeless tobacco: Never Used  . Tobacco comment: smokes about 10 a day   Substance and Sexual Activity  . Alcohol use: No    Alcohol/week: 0.0 standard drinks  . Drug use: No  . Sexual activity: Never    Birth control/protection: Surgical  Other Topics Concern  . Not on file  Social History Narrative  . Not on file   Social Determinants of Health   Financial Resource Strain: Low Risk   . Difficulty of Paying Living Expenses: Not hard at all  Food Insecurity: No Food Insecurity  . Worried About Charity fundraiser in the Last Year: Never true  . Ran Out of Food in the Last Year: Never true  Transportation Needs: No Transportation Needs  . Lack of Transportation (Medical): No  . Lack of Transportation (Non-Medical): No  Physical Activity: Insufficiently Active  . Days of Exercise per Week: 3 days  . Minutes of Exercise per Session: 40 min  Stress: No Stress Concern Present  . Feeling of Stress : Only a little  Social Connections:  Moderately Isolated  . Frequency of Communication with Friends and Family: Three times a week  . Frequency of Social Gatherings with Friends and Family: Three times a week  . Attends Religious Services: Never  . Active Member of Clubs or Organizations: No  . Attends Archivist Meetings: Never  . Marital Status: Widowed    Outpatient Encounter Medications as of 04/09/2020  Medication Sig  . ACCU-CHEK AVIVA PLUS test strip AS DIRECTED  . ACCU-CHEK FASTCLIX LANCETS MISC AS DIRECTED  . alendronate (FOSAMAX)  70 MG tablet Take 1 tablet (70 mg total) by mouth every 7 (seven) days. Take with a full glass of water on an empty stomach.  Marland Kitchen aspirin EC 81 MG tablet Take 81 mg by mouth daily.  . Aspirin-Acetaminophen-Caffeine (GOODY HEADACHE PO) Take 1 Package by mouth 2 (two) times daily as needed (headache).  . blood glucose meter kit and supplies Dispense based on patient and insurance preference. Once daily testing dx e11.9  . busPIRone (BUSPAR) 10 MG tablet Take 1 tablet (10 mg total) by mouth 3 (three) times daily.  . calcium carbonate (OSCAL) 1500 (600 Ca) MG TABS tablet Take by mouth.  . Cholecalciferol 1.25 MG (50000 UT) TABS Take by mouth.  . DULoxetine (CYMBALTA) 60 MG capsule Take 1 capsule (60 mg total) by mouth 2 (two) times daily.  Marland Kitchen ezetimibe (ZETIA) 10 MG tablet Take 1 tablet (10 mg total) by mouth daily.  . famotidine (PEPCID) 20 MG tablet TAKE 1 TABLET(20 MG) BY MOUTH TWICE DAILY  . fenofibrate (TRICOR) 145 MG tablet TAKE 1 TABLET BY MOUTH EVERY DAY  . hydrochlorothiazide (MICROZIDE) 12.5 MG capsule TAKE 1 CAPSULE(12.5 MG) BY MOUTH DAILY  . isosorbide mononitrate (IMDUR) 30 MG 24 hr tablet TAKE 1 TABLET(30 MG) BY MOUTH AT BEDTIME  . levothyroxine (SYNTHROID) 100 MCG tablet Take 1 tablet (100 mcg total) by mouth daily.  Marland Kitchen losartan (COZAAR) 100 MG tablet Take 1 tablet (100 mg total) by mouth daily.  . metoprolol succinate (TOPROL-XL) 25 MG 24 hr tablet TAKE 1 TABLET BY MOUTH  EVERY DAY  . Multiple Vitamin (MULTIVITAMIN) tablet Take 1 tablet by mouth daily.  . nitroGLYCERIN (NITROSTAT) 0.4 MG SL tablet DISSOLVE 1 TABLET UNDER THE TONGUE EVERY 5 MINUTES AS NEEDED FOR CHEST PAIN  . Omega-3 Fatty Acids (FISH OIL PO) Take 1 capsule by mouth daily.  Marland Kitchen oxybutynin (DITROPAN-XL) 5 MG 24 hr tablet Take 1 tablet (5 mg total) by mouth at bedtime.  Marland Kitchen oxyCODONE-acetaminophen (PERCOCET) 10-325 MG tablet TK 1 T PO  Q 6 H PRF CHRONIC PAIN  . polyethylene glycol-electrolytes (TRILYTE) 420 g solution Take 4,000 mLs by mouth as directed.  . rosuvastatin (CRESTOR) 40 MG tablet Take 1 tablet (40 mg total) by mouth daily.   No facility-administered encounter medications on file as of 04/09/2020.    Activities of Daily Living In your present state of health, do you have any difficulty performing the following activities: 04/09/2020  Hearing? N  Vision? N  Difficulty concentrating or making decisions? N  Walking or climbing stairs? Y  Dressing or bathing? N  Doing errands, shopping? N  Preparing Food and eating ? N  Using the Toilet? N  In the past six months, have you accidently leaked urine? N  Do you have problems with loss of bowel control? N  Managing your Medications? N  Managing your Finances? N  Housekeeping or managing your Housekeeping? N  Some recent data might be hidden    Patient Care Team: Fayrene Helper, MD as PCP - General Domenic Polite Aloha Gell, MD as PCP - Cardiology (Cardiology) Earnie Larsson, MD as Consulting Physician (Neurosurgery) Carole Civil, MD as Consulting Physician (Orthopedic Surgery) Phillips Odor, MD as Consulting Physician (Neurology) Gala Romney Cristopher Estimable, MD as Consulting Physician (Gastroenterology)    Assessment:   This is a routine wellness examination for Arica.  Exercise Activities and Dietary recommendations Current Exercise Habits: The patient does not participate in regular exercise at present, Exercise limited by: orthopedic  condition(s)  Goals    .  Exercise 3x per week (30 min per time)     Recommend starting a routine exercise program at least 3 days a week for 30-45 minutes at a time as tolerated.       . Prevent falls     Patient has a history of previous falls. Encouraged to take her time, keep walkways free of clutter     . Quit smoking / using tobacco       Fall Risk Fall Risk  04/09/2020 03/22/2020 02/16/2020 08/24/2019 05/02/2019  Falls in the past year? 1 0 0 1 1  Number falls in past yr: 1 0 0 1 1  Injury with Fall? - 0 0 1 1  Risk Factor Category  - - - - -  Risk for fall due to : Impaired mobility No Fall Risks No Fall Risks Impaired balance/gait;Impaired mobility;Orthopedic patient -  Follow up - Falls evaluation completed - - -   Is the patient's home free of loose throw rugs in walkways, pet beds, electrical cords, etc?   yes      Grab bars in the bathroom? yes      Handrails on the stairs?   yes      Adequate lighting?   yes  Timed Get Up and Go performed: unable due to virtual visit   Depression Screen PHQ 2/9 Scores 04/09/2020 03/22/2020 02/16/2020 12/21/2019  PHQ - 2 Score 0 0 0 3  PHQ- 9 Score 0 _0 Exception Documentation - - - Patient refusal     Cognitive Function     6CIT Screen 04/09/2020 04/05/2019 07/05/2017  What Year? 0 points 0 points 0 points  What month? 0 points 0 points 0 points  What time? 0 points 0 points 0 points  Count back from 20 0 points 0 points 0 points  Months in reverse 0 points 0 points 0 points  Repeat phrase 4 points 4 points 0 points  Total Score 4 4 0    Immunization History  Administered Date(s) Administered  . Fluad Quad(high Dose 65+) 08/24/2019  . Influenza Split 10/20/2011, 08/08/2012  . Influenza Whole 09/23/2009, 09/26/2010  . Influenza,inj,Quad PF,6+ Mos 07/28/2013, 09/03/2014, 10/02/2015, 09/09/2016, 09/27/2017, 08/02/2018  . Pneumococcal Conjugate-13 01/30/2015  . Pneumococcal Polysaccharide-23 01/30/2011  . Td 06/13/2010  .  Zoster 12/09/2015    Qualifies for Shingles Vaccine? Can get at pharmacy   Screening Tests Health Maintenance  Topic Date Due  . COVID-19 Vaccine (1) Never done  . COLONOSCOPY  02/19/2016  . OPHTHALMOLOGY EXAM  01/27/2019  . TETANUS/TDAP  06/13/2020  . INFLUENZA VACCINE  06/16/2020  . HEMOGLOBIN A1C  06/23/2020  . FOOT EXAM  08/25/2020  . MAMMOGRAM  09/05/2021  . DEXA SCAN  Completed  . Hepatitis C Screening  Completed  . PNA vac Low Risk Adult  Completed    Cancer Screenings: Lung: Low Dose CT Chest recommended if Age 27-80 years, 30 pack-year currently smoking OR have quit w/in 15years. Patient does qualify.- scheduled  Breast:  Up to date on Mammogram? Yes   Up to date of Bone Density/Dexa? Yes Colorectal: up to date   Additional Screenings:  Hepatitis C Screening:      Plan:     I have personally reviewed and noted the following in the patient's chart:   . Medical and social history . Use of alcohol, tobacco or illicit drugs  . Current medications and supplements . Functional ability and status . Nutritional status . Physical activity . Advanced  directives . List of other physicians . Hospitalizations, surgeries, and ER visits in previous 12 months . Vitals . Screenings to include cognitive, depression, and falls . Referrals and appointments  In addition, I have reviewed and discussed with patient certain preventive protocols, quality metrics, and best practice recommendations. A written personalized care plan for preventive services as well as general preventive health recommendations were provided to patient.    I provided 20 minutes of non-face-to-face time during this encounter.    Kate Sable, LPN, LPN  [id number]/33/[id number]

## 2020-04-18 ENCOUNTER — Other Ambulatory Visit: Payer: Self-pay | Admitting: Family Medicine

## 2020-04-23 NOTE — Patient Instructions (Signed)
ZARRA GEFFERT  04/23/2020     @PREFPERIOPPHARMACY @   Your procedure is scheduled on  04/29/2020 .  Report to [facility] at  1100  A.M.  Call this number if you have problems the morning of surgery:  [phone]   Remember:  Follow the diet and prep instructions given to you by Dr Roseanne Kaufman office.                      Take these medicines the morning of surgery with A SIP OF WATER  Cymbalta, pepcid, levothyroxine.    Do not wear jewelry, make-up or nail polish.  Do not wear lotions, powders, or perfumes. Please wear deodorant and brush your teeth.  Do not shave 48 hours prior to surgery.  Men may shave face and neck.  Do not bring valuables to the hospital.  [facility] is not responsible for any belongings or valuables.  Contacts, dentures or bridgework may not be worn into surgery.  Leave your suitcase in the car.  After surgery it may be brought to your room.  For patients admitted to the hospital, discharge time will be determined by your treatment team.  Patients discharged the day of surgery will not be allowed to drive home.   Name and phone number of your driver:   family Special instructions:  DO NOT smoke the morning of your procedure.  Please read over the following fact sheets that you were given. Anesthesia Post-op Instructions and Care and Recovery After Surgery       Colonoscopy, Adult, Care After This sheet gives you information about how to care for yourself after your procedure. Your health care provider may also give you more specific instructions. If you have problems or questions, contact your health care provider. What can I expect after the procedure? After the procedure, it is common to have:  A small amount of blood in your stool for 24 hours after the procedure.  Some gas.  Mild cramping or bloating of your abdomen. Follow these instructions at home: Eating and drinking   Drink enough fluid to keep your urine pale  yellow.  Follow instructions from your health care provider about eating or drinking restrictions.  Resume your normal diet as instructed by your health care provider. Avoid heavy or fried foods that are hard to digest. Activity  Rest as told by your health care provider.  Avoid sitting for a long time without moving. Get up to take short walks every 1-2 hours. This is important to improve blood flow and breathing. Ask for help if you feel weak or unsteady.  Return to your normal activities as told by your health care provider. Ask your health care provider what activities are safe for you. Managing cramping and bloating   Try walking around when you have cramps or feel bloated.  Apply heat to your abdomen as told by your health care provider. Use the heat source that your health care provider recommends, such as a moist heat pack or a heating pad. ? Place a towel between your skin and the heat source. ? Leave the heat on for 20-30 minutes. ? Remove the heat if your skin turns bright red. This is especially important if you are unable to feel pain, heat, or cold. You may have a greater risk of getting burned. General instructions  For the first 24 hours after the procedure: ? Do not drive or use machinery. ? Do  not sign important documents. ? Do not drink alcohol. ? Do your regular daily activities at a slower pace than normal. ? Eat soft foods that are easy to digest.  Take over-the-counter and prescription medicines only as told by your health care provider.  Keep all follow-up visits as told by your health care provider. This is important. Contact a health care provider if:  You have blood in your stool 2-3 days after the procedure. Get help right away if you have:  More than a small spotting of blood in your stool.  Large blood clots in your stool.  Swelling of your abdomen.  Nausea or vomiting.  A fever.  Increasing pain in your abdomen that is not relieved with  medicine. Summary  After the procedure, it is common to have a small amount of blood in your stool. You may also have mild cramping and bloating of your abdomen.  For the first 24 hours after the procedure, do not drive or use machinery, sign important documents, or drink alcohol.  Get help right away if you have a lot of blood in your stool, nausea or vomiting, a fever, or increased pain in your abdomen. This information is not intended to replace advice given to you by your health care provider. Make sure you discuss any questions you have with your health care provider. Document Revised: 05/29/2019 Document Reviewed: 05/29/2019 Elsevier Patient Education  [place] After These instructions provide you with information about caring for yourself after your procedure. Your health care provider may also give you more specific instructions. Your treatment has been planned according to current medical practices, but problems sometimes occur. Call your health care provider if you have any problems or questions after your procedure. What can I expect after the procedure? After your procedure, you may:  Feel sleepy for several hours.  Feel clumsy and have poor balance for several hours.  Feel forgetful about what happened after the procedure.  Have poor judgment for several hours.  Feel nauseous or vomit.  Have a sore throat if you had a breathing tube during the procedure. Follow these instructions at home: For at least 24 hours after the procedure:      Have a responsible adult stay with you. It is important to have someone help care for you until you are awake and alert.  Rest as needed.  Do not: ? Participate in activities in which you could fall or become injured. ? Drive. ? Use heavy machinery. ? Drink alcohol. ? Take sleeping pills or medicines that cause drowsiness. ? Make important decisions or sign legal documents. ? Take care  of children on your own. Eating and drinking  Follow the diet that is recommended by your health care provider.  If you vomit, drink water, juice, or soup when you can drink without vomiting.  Make sure you have little or no nausea before eating solid foods. General instructions  Take over-the-counter and prescription medicines only as told by your health care provider.  If you have sleep apnea, surgery and certain medicines can increase your risk for breathing problems. Follow instructions from your health care provider about wearing your sleep device: ? Anytime you are sleeping, including during daytime naps. ? While taking prescription pain medicines, sleeping medicines, or medicines that make you drowsy.  If you smoke, do not smoke without supervision.  Keep all follow-up visits as told by your health care provider. This is important. Contact a health care  provider if:  You keep feeling nauseous or you keep vomiting.  You feel light-headed.  You develop a rash.  You have a fever. Get help right away if:  You have trouble breathing. Summary  For several hours after your procedure, you may feel sleepy and have poor judgment.  Have a responsible adult stay with you for at least 24 hours or until you are awake and alert. This information is not intended to replace advice given to you by your health care provider. Make sure you discuss any questions you have with your health care provider. Document Revised: 01/31/2018 Document Reviewed: 02/23/2016 Elsevier Patient Education  [place].

## 2020-04-25 ENCOUNTER — Other Ambulatory Visit (HOSPITAL_COMMUNITY)
Admission: RE | Admit: 2020-04-25 | Discharge: 2020-04-25 | Disposition: A | Discharge status: Home / Self Care | Payer: Medicare HMO | Source: Ambulatory Visit | Attending: Internal Medicine | Admitting: Internal Medicine

## 2020-04-25 ENCOUNTER — Encounter (HOSPITAL_COMMUNITY)
Admission: RE | Admit: 2020-04-25 | Discharge: 2020-04-25 | Disposition: A | Discharge status: Home / Self Care | Payer: Medicare HMO | Source: Ambulatory Visit | Attending: Internal Medicine | Admitting: Internal Medicine

## 2020-04-25 ENCOUNTER — Other Ambulatory Visit: Payer: Self-pay

## 2020-04-25 ENCOUNTER — Encounter (HOSPITAL_COMMUNITY): Payer: Self-pay

## 2020-04-25 DIAGNOSIS — Z01812 Encounter for preprocedural laboratory examination: Secondary | ICD-10-CM | POA: Diagnosis present

## 2020-04-25 DIAGNOSIS — Z20822 Contact with and (suspected) exposure to covid-19: Secondary | ICD-10-CM | POA: Diagnosis not present

## 2020-04-25 LAB — BASIC METABOLIC PANEL
Anion gap: 8 (ref 5–15)
BUN: 35 mg/dL — ABNORMAL HIGH (ref 8–23)
CO2: 25 mmol/L (ref 22–32)
Calcium: 9.2 mg/dL (ref 8.9–10.3)
Chloride: 100 mmol/L (ref 98–111)
Creatinine, Ser: 1.55 mg/dL — ABNORMAL HIGH (ref 0.44–1.00)
GFR calc Af Amer: 38 mL/min — ABNORMAL LOW (ref >60)
GFR calc non Af Amer: 33 mL/min — ABNORMAL LOW (ref >60)
Glucose, Bld: 164 mg/dL — ABNORMAL HIGH (ref 70–99)
Potassium: 4.1 mmol/L (ref 3.5–5.1)
Sodium: 133 mmol/L — ABNORMAL LOW (ref 135–145)

## 2020-04-25 LAB — SARS CORONAVIRUS 2 (TAT 6-24 HRS): SARS Coronavirus 2: NEGATIVE

## 2020-04-29 ENCOUNTER — Ambulatory Visit (HOSPITAL_COMMUNITY): Payer: Medicare HMO | Admitting: Anesthesiology

## 2020-04-29 ENCOUNTER — Encounter (HOSPITAL_COMMUNITY)
Admission: RE | Disposition: A | Discharge status: Home / Self Care | Payer: Self-pay | Source: Home / Self Care | Attending: Internal Medicine

## 2020-04-29 ENCOUNTER — Ambulatory Visit (HOSPITAL_COMMUNITY)
Admission: RE | Admit: 2020-04-29 | Discharge: 2020-04-29 | Disposition: A | Discharge status: Home / Self Care | Payer: Medicare HMO | Attending: Internal Medicine | Admitting: Internal Medicine

## 2020-04-29 ENCOUNTER — Encounter (HOSPITAL_COMMUNITY): Payer: Self-pay | Admitting: Internal Medicine

## 2020-04-29 DIAGNOSIS — E785 Hyperlipidemia, unspecified: Secondary | ICD-10-CM | POA: Diagnosis not present

## 2020-04-29 DIAGNOSIS — E1122 Type 2 diabetes mellitus with diabetic chronic kidney disease: Secondary | ICD-10-CM | POA: Insufficient documentation

## 2020-04-29 DIAGNOSIS — I129 Hypertensive chronic kidney disease with stage 1 through stage 4 chronic kidney disease, or unspecified chronic kidney disease: Secondary | ICD-10-CM | POA: Insufficient documentation

## 2020-04-29 DIAGNOSIS — D124 Benign neoplasm of descending colon: Secondary | ICD-10-CM | POA: Diagnosis not present

## 2020-04-29 DIAGNOSIS — Z951 Presence of aortocoronary bypass graft: Secondary | ICD-10-CM | POA: Diagnosis not present

## 2020-04-29 DIAGNOSIS — Z7982 Long term (current) use of aspirin: Secondary | ICD-10-CM | POA: Diagnosis not present

## 2020-04-29 DIAGNOSIS — I252 Old myocardial infarction: Secondary | ICD-10-CM | POA: Insufficient documentation

## 2020-04-29 DIAGNOSIS — K640 First degree hemorrhoids: Secondary | ICD-10-CM | POA: Diagnosis not present

## 2020-04-29 DIAGNOSIS — K5730 Diverticulosis of large intestine without perforation or abscess without bleeding: Secondary | ICD-10-CM | POA: Insufficient documentation

## 2020-04-29 DIAGNOSIS — E669 Obesity, unspecified: Secondary | ICD-10-CM | POA: Insufficient documentation

## 2020-04-29 DIAGNOSIS — N183 Chronic kidney disease, stage 3 unspecified: Secondary | ICD-10-CM | POA: Diagnosis not present

## 2020-04-29 DIAGNOSIS — K219 Gastro-esophageal reflux disease without esophagitis: Secondary | ICD-10-CM | POA: Diagnosis not present

## 2020-04-29 DIAGNOSIS — I2510 Atherosclerotic heart disease of native coronary artery without angina pectoris: Secondary | ICD-10-CM | POA: Insufficient documentation

## 2020-04-29 DIAGNOSIS — Z7983 Long term (current) use of bisphosphonates: Secondary | ICD-10-CM | POA: Diagnosis not present

## 2020-04-29 DIAGNOSIS — Z885 Allergy status to narcotic agent status: Secondary | ICD-10-CM | POA: Insufficient documentation

## 2020-04-29 DIAGNOSIS — M1990 Unspecified osteoarthritis, unspecified site: Secondary | ICD-10-CM | POA: Insufficient documentation

## 2020-04-29 DIAGNOSIS — Z6832 Body mass index (BMI) 32.0-32.9, adult: Secondary | ICD-10-CM | POA: Insufficient documentation

## 2020-04-29 DIAGNOSIS — D120 Benign neoplasm of cecum: Secondary | ICD-10-CM | POA: Insufficient documentation

## 2020-04-29 DIAGNOSIS — Z79899 Other long term (current) drug therapy: Secondary | ICD-10-CM | POA: Insufficient documentation

## 2020-04-29 DIAGNOSIS — F17210 Nicotine dependence, cigarettes, uncomplicated: Secondary | ICD-10-CM | POA: Insufficient documentation

## 2020-04-29 DIAGNOSIS — Z8542 Personal history of malignant neoplasm of other parts of uterus: Secondary | ICD-10-CM | POA: Insufficient documentation

## 2020-04-29 DIAGNOSIS — K635 Polyp of colon: Secondary | ICD-10-CM | POA: Diagnosis not present

## 2020-04-29 DIAGNOSIS — E039 Hypothyroidism, unspecified: Secondary | ICD-10-CM | POA: Diagnosis not present

## 2020-04-29 DIAGNOSIS — Z888 Allergy status to other drugs, medicaments and biological substances status: Secondary | ICD-10-CM | POA: Diagnosis not present

## 2020-04-29 DIAGNOSIS — R195 Other fecal abnormalities: Secondary | ICD-10-CM | POA: Diagnosis present

## 2020-04-29 DIAGNOSIS — Z79890 Hormone replacement therapy (postmenopausal): Secondary | ICD-10-CM | POA: Diagnosis not present

## 2020-04-29 DIAGNOSIS — F418 Other specified anxiety disorders: Secondary | ICD-10-CM | POA: Insufficient documentation

## 2020-04-29 HISTORY — PX: COLONOSCOPY WITH PROPOFOL: SHX5780

## 2020-04-29 HISTORY — PX: POLYPECTOMY: SHX5525

## 2020-04-29 LAB — GLUCOSE, CAPILLARY
Glucose-Capillary: 123 mg/dL — ABNORMAL HIGH (ref 70–99)
Glucose-Capillary: 105 mg/dL — ABNORMAL HIGH (ref 70–99)

## 2020-04-29 SURGERY — COLONOSCOPY WITH PROPOFOL
Anesthesia: General

## 2020-04-29 MED ORDER — LACTATED RINGERS IV SOLN: INTRAVENOUS | Status: DC

## 2020-04-29 MED ORDER — GLYCOPYRROLATE PF 0.2 MG/ML IJ SOSY
PREFILLED_SYRINGE | INTRAMUSCULAR | Status: AC
  Filled 2020-04-29: qty 4

## 2020-04-29 MED ORDER — PROPOFOL 10 MG/ML IV BOLUS
INTRAVENOUS | Status: AC
  Filled 2020-04-29: qty 40

## 2020-04-29 MED ORDER — CHLORHEXIDINE GLUCONATE CLOTH 2 % EX PADS: 6.0000 | MEDICATED_PAD | Freq: Once | CUTANEOUS | Status: DC

## 2020-04-29 MED ORDER — PROPOFOL 500 MG/50ML IV EMUL
INTRAVENOUS | Status: DC | PRN
  Administered 2020-04-29: 150 ug/kg/min via INTRAVENOUS

## 2020-04-29 MED ORDER — PROPOFOL 10 MG/ML IV BOLUS
INTRAVENOUS | Status: DC | PRN
  Administered 2020-04-29: 20 mg via INTRAVENOUS
  Administered 2020-04-29: 50 mg via INTRAVENOUS
  Administered 2020-04-29: 30 mg via INTRAVENOUS
  Administered 2020-04-29: 40 mg via INTRAVENOUS
  Administered 2020-04-29: 10 mg via INTRAVENOUS
  Administered 2020-04-29: 30 mg via INTRAVENOUS
  Administered 2020-04-29: 20 mg via INTRAVENOUS
  Administered 2020-04-29: 30 mg via INTRAVENOUS
  Administered 2020-04-29: 50 mg via INTRAVENOUS
  Administered 2020-04-29: 30 mg via INTRAVENOUS

## 2020-04-29 MED ORDER — LACTATED RINGERS IV SOLN
INTRAVENOUS | Status: DC | PRN
Start: 2020-04-29 — End: 2020-04-29
  Administered 2020-04-29: 1000 mL via INTRAVENOUS

## 2020-04-29 NOTE — Discharge Instructions (Signed)
Colonoscopy Discharge Instructions  Read the instructions outlined below and refer to this sheet in the next few weeks. These discharge instructions provide you with general information on caring for yourself after you leave the hospital. Your doctor may also give you specific instructions. While your treatment has been planned according to the most current medical practices available, unavoidable complications occasionally occur. If you have any problems or questions after discharge, call Dr. Gala Romney at [phone]. ACTIVITY  You may resume your regular activity, but move at a slower pace for the next 24 hours.   Take frequent rest periods for the next 24 hours.   Walking will help get rid of the air and reduce the bloated feeling in your belly (abdomen).   No driving for 24 hours (because of the medicine (anesthesia) used during the test).    Do not sign any important legal documents or operate any machinery for 24 hours (because of the anesthesia used during the test).  NUTRITION  Drink plenty of fluids.   You may resume your normal diet as instructed by your doctor.   Begin with a light meal and progress to your normal diet. Heavy or fried foods are harder to digest and may make you feel sick to your stomach (nauseated).   Avoid alcoholic beverages for 24 hours or as instructed.  MEDICATIONS  You may resume your normal medications unless your doctor tells you otherwise.  WHAT YOU CAN EXPECT TODAY  Some feelings of bloating in the abdomen.   Passage of more gas than usual.   Spotting of blood in your stool or on the toilet paper.  IF YOU HAD POLYPS REMOVED DURING THE COLONOSCOPY:  No aspirin products for 7 days or as instructed.   No alcohol for 7 days or as instructed.   Eat a soft diet for the next 24 hours.  FINDING OUT THE RESULTS OF YOUR TEST Not all test results are available during your visit. If your test results are not back during the visit, make an appointment  with your caregiver to find out the results. Do not assume everything is normal if you have not heard from your caregiver or the medical facility. It is important for you to follow up on all of your test results.  SEEK IMMEDIATE MEDICAL ATTENTION IF:  You have more than a spotting of blood in your stool.   Your belly is swollen (abdominal distention).   You are nauseated or vomiting.   You have a temperature over 101.   You have abdominal pain or discomfort that is severe or gets worse throughout the day.   Multiple polyps removed from your colon today  Information on polyp and diverticulosis provided  No MRI until clip gone  Further recommendations to follow pending review of pathology report  At patient request I called Hilbert Corrigan at [phone] -went to [place] Anesthesia is a term that refers to techniques, procedures, and medicines that help a person stay safe and comfortable during a medical procedure. Monitored anesthesia care, or sedation, is one type of anesthesia. Your anesthesia specialist may recommend sedation if you will be having a procedure that does not require you to be unconscious, such as:  Cataract surgery.  A dental procedure.  A biopsy.  A colonoscopy. During the procedure, you may receive a medicine to help you relax (sedative). There are three levels of sedation:  Mild sedation. At this level, you may feel awake and relaxed. You will be  able to follow directions.  Moderate sedation. At this level, you will be sleepy. You may not remember the procedure.  Deep sedation. At this level, you will be asleep. You will not remember the procedure. The more medicine you are given, the deeper your level of sedation will be. Depending on how you respond to the procedure, the anesthesia specialist may change your level of sedation or the type of anesthesia to fit your needs. An anesthesia specialist will monitor you closely  during the procedure. Let your health care provider know about:  Any allergies you have.  All medicines you are taking, including vitamins, herbs, eye drops, creams, and over-the-counter medicines.  Any use of steroids (by mouth or as a cream).  Any problems you or family members have had with sedatives and anesthetic medicines.  Any blood disorders you have.  Any surgeries you have had.  Any medical conditions you have, such as sleep apnea.  Whether you are pregnant or may be pregnant.  Any use of cigarettes, alcohol, or street drugs. What are the risks? Generally, this is a safe procedure. However, problems may occur, including:  Getting too much medicine (oversedation).  Nausea.  Allergic reaction to medicines.  Trouble breathing. If this happens, a breathing tube may be used to help with breathing. It will be removed when you are awake and breathing on your own.  Heart trouble.  Lung trouble. Before the procedure Staying hydrated Follow instructions from your health care provider about hydration, which may include:  Up to 2 hours before the procedure - you may continue to drink clear liquids, such as water, clear fruit juice, black coffee, and plain tea. Eating and drinking restrictions Follow instructions from your health care provider about eating and drinking, which may include:  8 hours before the procedure - stop eating heavy meals or foods such as meat, fried foods, or fatty foods.  6 hours before the procedure - stop eating light meals or foods, such as toast or cereal.  6 hours before the procedure - stop drinking milk or drinks that contain milk.  2 hours before the procedure - stop drinking clear liquids. Medicines Ask your health care provider about:  Changing or stopping your regular medicines. This is especially important if you are taking diabetes medicines or blood thinners.  Taking medicines such as aspirin and ibuprofen. These medicines can  thin your blood. Do not take these medicines before your procedure if your health care provider instructs you not to. Tests and exams  You will have a physical exam.  You may have blood tests done to show: ? How well your kidneys and liver are working. ? How well your blood can clot. General instructions  Plan to have someone take you home from the hospital or clinic.  If you will be going home right after the procedure, plan to have someone with you for 24 hours.  What happens during the procedure?  Your blood pressure, heart rate, breathing, level of pain and overall condition will be monitored.  An IV tube will be inserted into one of your veins.  Your anesthesia specialist will give you medicines as needed to keep you comfortable during the procedure. This may mean changing the level of sedation.  The procedure will be performed. After the procedure  Your blood pressure, heart rate, breathing rate, and blood oxygen level will be monitored until the medicines you were given have worn off.  Do not drive for 24 hours if you received  a sedative.  You may: ? Feel sleepy, clumsy, or nauseous. ? Feel forgetful about what happened after the procedure. ? Have a sore throat if you had a breathing tube during the procedure. ? Vomit. This information is not intended to replace advice given to you by your health care provider. Make sure you discuss any questions you have with your health care provider. Document Revised: 10/15/2017 Document Reviewed: 02/23/2016 Elsevier Patient Education  [place].    Colon Polyps  Polyps are tissue growths inside the body. Polyps can grow in many places, including the large intestine (colon). A polyp may be a round bump or a mushroom-shaped growth. You could have one polyp or several. Most colon polyps are noncancerous (benign). However, some colon polyps can become cancerous over time. Finding and removing the polyps early can help  prevent this. What are the causes? The exact cause of colon polyps is not known. What increases the risk? You are more likely to develop this condition if you:  Have a family history of colon cancer or colon polyps.  Are older than 82 or older than 45 if you are African American.  Have inflammatory bowel disease, such as ulcerative colitis or Crohn's disease.  Have certain hereditary conditions, such as: ? Familial adenomatous polyposis. ? Lynch syndrome. ? Turcot syndrome. ? Peutz-Jeghers syndrome.  Are overweight.  Smoke cigarettes.  Do not get enough exercise.  Drink too much alcohol.  Eat a diet that is high in fat and red meat and low in fiber.  Had childhood cancer that was treated with abdominal radiation. What are the signs or symptoms? Most polyps do not cause symptoms. If you have symptoms, they may include:  Blood coming from your rectum when having a bowel movement.  Blood in your stool. The stool may look dark red or black.  Abdominal pain.  A change in bowel habits, such as constipation or diarrhea. How is this diagnosed? This condition is diagnosed with a colonoscopy. This is a procedure in which a lighted, flexible scope is inserted into the anus and then passed into the colon to examine the area. Polyps are sometimes found when a colonoscopy is done as part of routine cancer screening tests. How is this treated? Treatment for this condition involves removing any polyps that are found. Most polyps can be removed during a colonoscopy. Those polyps will then be tested for cancer. Additional treatment may be needed depending on the results of testing. Follow these instructions at home: Lifestyle  Maintain a healthy weight, or lose weight if recommended by your health care provider.  Exercise every day or as told by your health care provider.  Do not use any products that contain nicotine or tobacco, such as cigarettes and e-cigarettes. If you need help  quitting, ask your health care provider.  If you drink alcohol, limit how much you have: ? 0-1 drink a day for women. ? 0-2 drinks a day for men.  Be aware of how much alcohol is in your drink. In the [place] one drink equals one 12 oz bottle of beer (355 mL), one 5 oz glass of wine (148 mL), or one 1 oz shot of hard liquor (44 mL). Eating and drinking   Eat foods that are high in fiber, such as fruits, vegetables, and whole grains.  Eat foods that are high in calcium and vitamin D, such as milk, cheese, yogurt, eggs, liver, fish, and broccoli.  Limit foods that are high in fat, such  as fried foods and desserts.  Limit the amount of red meat and processed meat you eat, such as hot dogs, sausage, bacon, and lunch meats. General instructions  Keep all follow-up visits as told by your health care provider. This is important. ? This includes having regularly scheduled colonoscopies. ? Talk to your health care provider about when you need a colonoscopy. Contact a health care provider if:  You have new or worsening bleeding during a bowel movement.  You have new or increased blood in your stool.  You have a change in bowel habits.  You lose weight for no known reason. Summary  Polyps are tissue growths inside the body. Polyps can grow in many places, including the colon.  Most colon polyps are noncancerous (benign), but some can become cancerous over time.  This condition is diagnosed with a colonoscopy.  Treatment for this condition involves removing any polyps that are found. Most polyps can be removed during a colonoscopy. This information is not intended to replace advice given to you by your health care provider. Make sure you discuss any questions you have with your health care provider. Document Revised: 02/17/2018 Document Reviewed: 02/17/2018 Elsevier Patient Education  [place].    Diverticulosis  Diverticulosis is a condition that develops when small  pouches (diverticula) form in the wall of the large intestine (colon). The colon is where water is absorbed and stool (feces) is formed. The pouches form when the inside layer of the colon pushes through weak spots in the outer layers of the colon. You may have a few pouches or many of them. The pouches usually do not cause problems unless they become inflamed or infected. When this happens, the condition is called diverticulitis. What are the causes? The cause of this condition is not known. What increases the risk? The following factors may make you more likely to develop this condition:  Being older than age 25. Your risk for this condition increases with age. Diverticulosis is rare among people younger than age 71. By age 35, many people have it.  Eating a low-fiber diet.  Having frequent constipation.  Being overweight.  Not getting enough exercise.  Smoking.  Taking over-the-counter pain medicines, like aspirin and ibuprofen.  Having a family history of diverticulosis. What are the signs or symptoms? In most people, there are no symptoms of this condition. If you do have symptoms, they may include:  Bloating.  Cramps in the abdomen.  Constipation or diarrhea.  Pain in the lower left side of the abdomen. How is this diagnosed? Because diverticulosis usually has no symptoms, it is most often diagnosed during an exam for other colon problems. The condition may be diagnosed by:  Using a flexible scope to examine the colon (colonoscopy).  Taking an X-ray of the colon after dye has been put into the colon (barium enema).  Having a CT scan. How is this treated? You may not need treatment for this condition. Your health care provider may recommend treatment to prevent problems. You may need treatment if you have symptoms or if you previously had diverticulitis. Treatment may include:  Eating a high-fiber diet.  Taking a fiber supplement.  Taking a live bacteria supplement  (probiotic).  Taking medicine to relax your colon. Follow these instructions at home: Medicines  Take over-the-counter and prescription medicines only as told by your health care provider.  If told by your health care provider, take a fiber supplement or probiotic. Constipation prevention Your condition may cause constipation.  To prevent or treat constipation, you may need to:  Drink enough fluid to keep your urine pale yellow.  Take over-the-counter or prescription medicines.  Eat foods that are high in fiber, such as beans, whole grains, and fresh fruits and vegetables.  Limit foods that are high in fat and processed sugars, such as fried or sweet foods.  General instructions  Try not to strain when you have a bowel movement.  Keep all follow-up visits as told by your health care provider. This is important. Contact a health care provider if you:  Have pain in your abdomen.  Have bloating.  Have cramps.  Have not had a bowel movement in 3 days. Get help right away if:  Your pain gets worse.  Your bloating becomes very bad.  You have a fever or chills, and your symptoms suddenly get worse.  You vomit.  You have bowel movements that are bloody or black.  You have bleeding from your rectum. Summary  Diverticulosis is a condition that develops when small pouches (diverticula) form in the wall of the large intestine (colon).  You may have a few pouches or many of them.  This condition is most often diagnosed during an exam for other colon problems.  Treatment may include increasing the fiber in your diet, taking supplements, or taking medicines. This information is not intended to replace advice given to you by your health care provider. Make sure you discuss any questions you have with your health care provider. Document Revised: 06/01/2019 Document Reviewed: 06/01/2019 Elsevier Patient Education  [place].

## 2020-04-29 NOTE — Transfer of Care (Signed)
Immediate Anesthesia Transfer of Care Note  Patient: Tammy Suarez  Procedure(s) Performed: COLONOSCOPY WITH PROPOFOL (N/A ) POLYPECTOMY  Patient Location: PACU  Anesthesia Type:General  Level of Consciousness: awake  Airway & Oxygen Therapy: Patient Spontanous Breathing  Post-op Assessment: Report given to RN  Post vital signs: Reviewed  Last Vitals:  Vitals Value Taken Time  BP 162/71 04/29/20 1307  Temp    Pulse    Resp 15 04/29/20 1308  SpO2    Vitals shown include unvalidated device data.  Last Pain:  Vitals:   04/29/20 1207  TempSrc:   PainSc: 9          Complications: No complications documented.

## 2020-04-29 NOTE — Anesthesia Preprocedure Evaluation (Signed)
Anesthesia Evaluation  Patient identified by MRN, date of birth, ID band Patient awake    Reviewed: Allergy & Precautions, H&P , NPO status , Patient's Chart, lab work & pertinent test results, reviewed documented beta blocker date and time   Airway Mallampati: II  TM Distance: >3 FB Neck ROM: full    Dental no notable dental hx. (+) Teeth Intact   Pulmonary neg pulmonary ROS, Current Smoker and Patient abstained from smoking.,    Pulmonary exam normal breath sounds clear to auscultation       Cardiovascular Exercise Tolerance: Good hypertension, + CAD and + Past MI  negative cardio ROS   Rhythm:regular Rate:Normal     Neuro/Psych  Headaches, PSYCHIATRIC DISORDERS Anxiety Depression  Neuromuscular disease    GI/Hepatic Neg liver ROS, hiatal hernia, GERD  Medicated,  Endo/Other  diabetesHypothyroidism   Renal/GU CRFRenal disease  negative genitourinary   Musculoskeletal   Abdominal   Peds  Hematology negative hematology ROS (+)   Anesthesia Other Findings   Reproductive/Obstetrics negative OB ROS                             Anesthesia Physical Anesthesia Plan  ASA: III  Anesthesia Plan: General   Post-op Pain Management:    Induction:   PONV Risk Score and Plan: 3 and Propofol infusion  Airway Management Planned:   Additional Equipment:   Intra-op Plan:   Post-operative Plan:   Informed Consent: I have reviewed the patients History and Physical, chart, labs and discussed the procedure including the risks, benefits and alternatives for the proposed anesthesia with the patient or authorized representative who has indicated his/her understanding and acceptance.     Dental Advisory Given  Plan Discussed with: CRNA  Anesthesia Plan Comments:         Anesthesia Quick Evaluation

## 2020-04-29 NOTE — H&P (Signed)
_0 @   Primary Care Physician:  Fayrene Helper, MD Primary Gastroenterologist:  Dr. Gala Romney  Pre-Procedure History & Physical: HPI:  Tammy Suarez is a 74 y.o. female here for further evaluation of a positive Cologuard.  Reports colonoscopy in the distant past (2007).  Reportedly normal.  No GI symptoms.  Past Medical History:  Diagnosis Date  . Annual physical exam 05/31/2016  . Arthritis   . Carpal tunnel syndrome    bilaterally  . Chronic back pain   . CKD (chronic kidney disease) stage 3, GFR 30-59 ml/min   . Coronary atherosclerosis of native coronary artery    Multivessel status post CABG - LIMA to LAD, SVG to OM and SVG to PDA  . Depression   . Economic stress 04/25/2019  . Essential hypertension   . GERD (gastroesophageal reflux disease)   . Headache   . History of conjunctivitis   . History of hiatal hernia   . Hyperlipidemia   . Hypothyroidism   . Myocardial infarction ([place]) 1999  . Neck pain 01/11/2017  . Obesity   . Shoulder pain, left 04/22/2017  . Type 2 diabetes mellitus ([place])   . Uterine cancer ([place])   . Vitamin B 12 deficiency     Past Surgical History:  Procedure Laterality Date  . ABDOMINAL HYSTERECTOMY    . BACK SURGERY    . CARDIAC SURGERY    . CARPAL TUNNEL RELEASE Left 07/23/2016   Procedure: CARPAL TUNNEL RELEASE;  Surgeon: Carole Civil, MD;  Location: [facility];  Service: Orthopedics;  Laterality: Left;  . CERVICAL LAMINECTOMY    . CHOLECYSTECTOMY    . COLONOSCOPY  2007   Dr. Irving Shows: reported to be normal  . CORONARY ARTERY BYPASS GRAFT N/A 11/02/2013   Procedure: CORONARY ARTERY BYPASS GRAFTING (CABG);  Surgeon: Gaye Pollack, MD;  Location: [place];  Service: Open Heart Surgery;  Laterality: N/A;  CABG x three, using left internal mammary artery and right leg greater saphenous vein harvested endoscopically  . COSMETIC SURGERY  07/01/2012   Recurrent skin infection on lower abdomen, [facility]  . HERNIA REPAIR    . Inguinal  herniorrhaphy    . INTRAOPERATIVE TRANSESOPHAGEAL ECHOCARDIOGRAM N/A 11/02/2013   Procedure: INTRAOPERATIVE TRANSESOPHAGEAL ECHOCARDIOGRAM;  Surgeon: Gaye Pollack, MD;  [facility];  Service: Open Heart Surgery;  Laterality: N/A;  . LAPAROSCOPIC GASTRIC BANDING  2009  . LEFT HEART CATHETERIZATION WITH CORONARY ANGIOGRAM N/A 10/30/2013   Procedure: LEFT HEART CATHETERIZATION WITH CORONARY ANGIOGRAM;  Surgeon: Minus Breeding, MD;  Location: [facility] C[facility];  Service: Cardiovascular;  Laterality: N/A;  . Left index finger repair    . LUMBAR FUSION    . SPINE SURGERY    . VESICOVAGINAL FISTULA CLOSURE W/ TAH      Prior to Admission medications   Medication Sig Start Date End Date Taking? Authorizing Provider  alendronate (FOSAMAX) 70 MG tablet Take 1 tablet (70 mg total) by mouth every 7 (seven) days. Take with a full glass of water on an empty stomach. 08/24/19  Yes Fayrene Helper, MD  aspirin EC 81 MG tablet Take 81 mg by mouth daily.   Yes [provider]  Aspirin-Acetaminophen-Caffeine (GOODY HEADACHE PO) Take 1 Package by mouth 2 (two) times daily as needed (headache).   Yes [provider]  calcium carbonate (OSCAL) 1500 (600 Ca) MG TABS tablet Take 600 mg of elemental calcium by mouth daily with breakfast.    Yes [provider]  DULoxetine (  CYMBALTA) 60 MG capsule Take 1 capsule (60 mg total) by mouth 2 (two) times daily. 02/26/20  Yes Fayrene Helper, MD  ezetimibe (ZETIA) 10 MG tablet Take 1 tablet (10 mg total) by mouth daily. 01/02/20  Yes Fayrene Helper, MD  famotidine (PEPCID) 20 MG tablet TAKE 1 TABLET(20 MG) BY MOUTH TWICE DAILY Patient taking differently: Take 20 mg by mouth daily.  03/25/20  Yes Fayrene Helper, MD  fenofibrate (TRICOR) 145 MG tablet TAKE 1 TABLET BY MOUTH EVERY DAY Patient taking differently: Take 145 mg by mouth daily.  02/23/20  Yes Fayrene Helper, MD  hydrochlorothiazide (MICROZIDE) 12.5 MG capsule TAKE 1  CAPSULE(12.5 MG) BY MOUTH DAILY Patient taking differently: Take 12.5 mg by mouth daily.  02/23/20  Yes Fayrene Helper, MD  isosorbide mononitrate (IMDUR) 30 MG 24 hr tablet TAKE 1 TABLET(30 MG) BY MOUTH AT BEDTIME Patient taking differently: Take 30 mg by mouth at bedtime.  01/24/20  Yes Satira Sark, MD  levothyroxine (SYNTHROID) 100 MCG tablet Take 1 tablet (100 mcg total) by mouth daily. Patient taking differently: Take 100 mcg by mouth daily before breakfast.  01/02/20  Yes Fayrene Helper, MD  losartan (COZAAR) 100 MG tablet Take 1 tablet (100 mg total) by mouth daily. 02/16/20  Yes Perlie Mayo, NP  metoprolol succinate (TOPROL-XL) 25 MG 24 hr tablet TAKE 1 TABLET BY MOUTH EVERY DAY Patient taking differently: Take 25 mg by mouth daily.  02/23/20  Yes Fayrene Helper, MD  Multiple Vitamin (MULTIVITAMIN) tablet Take 1 tablet by mouth daily.   Yes [provider]  Omega-3 Fatty Acids (FISH OIL PO) Take 1 capsule by mouth daily.   Yes [provider]  oxybutynin (DITROPAN-XL) 5 MG 24 hr tablet Take 1 tablet (5 mg total) by mouth at bedtime. 02/16/20  Yes Perlie Mayo, NP  rosuvastatin (CRESTOR) 40 MG tablet TAKE 1 TABLET(40 MG) BY MOUTH DAILY 04/18/20  Yes Fayrene Helper, MD  ACCU-CHEK AVIVA PLUS test strip AS DIRECTED 12/27/18   Fayrene Helper, MD  ACCU-CHEK FASTCLIX LANCETS MISC AS DIRECTED 12/27/18   Fayrene Helper, MD  blood glucose meter kit and supplies Dispense based on patient and insurance preference. Once daily testing dx e11.9 12/27/18   Fayrene Helper, MD  busPIRone (BUSPAR) 10 MG tablet Take 1 tablet (10 mg total) by mouth 3 (three) times daily. Patient not taking: Reported on 04/12/2020 03/22/20   Perlie Mayo, NP  nitroGLYCERIN (NITROSTAT) 0.4 MG SL tablet DISSOLVE 1 TABLET UNDER THE TONGUE EVERY 5 MINUTES AS NEEDED FOR CHEST PAIN Patient taking differently: Place 0.4 mg under the tongue every 5 (five) minutes as needed for chest  pain.  03/13/19   Satira Sark, MD  polyethylene glycol-electrolytes (TRILYTE) 420 g solution Take 4,000 mLs by mouth as directed. 02/12/20   Daneil Dolin, MD    Allergies as of 02/12/2020 - Review Complete 02/12/2020  Allergen Reaction Noted  . Gabapentin Other (See Comments) 06/23/2016  . Meperidine hcl Nausea And Vomiting 12/09/2007  . Niacin Other (See Comments) 03/14/2010  . Propoxyphene n-acetaminophen Nausea And Vomiting 09/05/2008  . Hydrocodone-acetaminophen Nausea And Vomiting 06/16/2012    Family History  Problem Relation Age of Onset  . Heart attack Mother   . Heart attack Father   . Hypertension Sister   . Heart attack Sister   . Cancer Brother        Renal cell   . Hepatitis  Daughter   . Drug abuse Daughter   . Hypertension Daughter   . Colon cancer Neg Hx     Social History   Socioeconomic History  . Marital status: Single    Spouse name: Not on file  . Number of children: 3  . Years of education: Not on file  . Highest education level: GED or equivalent  Occupational History  . Not on file  Tobacco Use  . Smoking status: Current Every Day Smoker    Packs/day: 0.25    Years: 50.00    Pack years: 12.50    Types: Cigarettes  . Smokeless tobacco: Never Used  . Tobacco comment: smokes about 10 a day   Vaping Use  . Vaping Use: Never used  Substance and Sexual Activity  . Alcohol use: No    Alcohol/week: 0.0 standard drinks  . Drug use: No  . Sexual activity: Never    Birth control/protection: Surgical  Other Topics Concern  . Not on file  Social History Narrative  . Not on file   Social Determinants of Health   Financial Resource Strain: Low Risk   . Difficulty of Paying Living Expenses: Not hard at all  Food Insecurity: No Food Insecurity  . Worried About Charity fundraiser in the Last Year: Never true  . Ran Out of Food in the Last Year: Never true  Transportation Needs: No Transportation Needs  . Lack of Transportation (Medical):  No  . Lack of Transportation (Non-Medical): No  Physical Activity: Insufficiently Active  . Days of Exercise per Week: 3 days  . Minutes of Exercise per Session: 40 min  Stress: No Stress Concern Present  . Feeling of Stress : Only a little  Social Connections: Socially Isolated  . Frequency of Communication with Friends and Family: Three times a week  . Frequency of Social Gatherings with Friends and Family: Three times a week  . Attends Religious Services: Never  . Active Member of Clubs or Organizations: No  . Attends Archivist Meetings: Never  . Marital Status: Widowed  Intimate Partner Violence:   . Fear of Current or Ex-Partner:   . Emotionally Abused:   Marland Kitchen Physically Abused:   . Sexually Abused:     Review of Systems: See HPI, otherwise negative ROS  Physical Exam: BP 140/61   Pulse 63   Temp 98 F (36.7 C) (Oral)   Resp 12   Ht _0  (1.651 m)   Wt 89.8 kg   SpO2 99%   BMI 32.95 kg/m  General:   Alert,  Well-developed, well-nourished, pleasant and cooperative in NAD SNeck:  Supple; no masses or thyromegaly. No significant cervical adenopathy. Lungs:  Clear throughout to auscultation.   No wheezes, crackles, or rhonchi. No acute distress. Heart:  Regular rate and rhythm; no murmurs, clicks, rubs,  or gallops. Abdomen: Non-distended, normal bowel sounds.  Soft and nontender without appreciable mass or hepatosplenomegaly.  Pulses:  Normal pulses noted. Extremities:  Without clubbing or edema.  Impression/Plan:Pleasant 74 year old lady with a positive Cologuard.  No GI symptoms.  Here for diagnostic colonoscopy per plan The risks, benefits, limitations, alternatives and imponderables have been reviewed with the patient. Questions have been answered. All parties are agreeable.     Notice: This dictation was prepared with Dragon dictation along with smaller phrase technology. Any transcriptional errors that result from this process are unintentional and may  not be corrected upon review.

## 2020-04-29 NOTE — Anesthesia Postprocedure Evaluation (Signed)
Anesthesia Post Note  Patient: Tammy Suarez  Procedure(s) Performed: COLONOSCOPY WITH PROPOFOL (N/A ) POLYPECTOMY  Patient location during evaluation: PACU Anesthesia Type: General Level of consciousness: awake and alert and oriented Pain management: pain level controlled Vital Signs Assessment: post-procedure vital signs reviewed and stable Respiratory status: spontaneous breathing Cardiovascular status: blood pressure returned to baseline and stable Postop Assessment: no apparent nausea or vomiting Anesthetic complications: no   No complications documented.   Last Vitals:  Vitals:   04/29/20 1125 04/29/20 1134  BP:  140/61  Pulse: 63   Resp: 12   Temp: 36.7 C   SpO2: 99%     Last Pain:  Vitals:   04/29/20 1207  TempSrc:   PainSc: 9                  Darreon Lutes

## 2020-04-29 NOTE — Op Note (Signed)
[facility] Patient Name: Tammy Suarez Procedure Date: 04/29/2020 10:24 AM MRN: [id number] Date of Birth: Jul 19, 1946 Attending MD: Gennette Pac , MD CSN: [id number] Age: 74 Admit Type: Outpatient Procedure:                Colonoscopy Indications:              Positive Cologuard test Providers:                Gennette Pac, MD, Jannett Celestine, RN, Crystal                            Page, Dyann Ruddle, Edythe Clarity, Technician Referring MD:              Medicines:                Propofol per Anesthesia Complications:            No immediate complications. Estimated Blood Loss:     Estimated blood loss was minimal. Procedure:                Pre-Anesthesia Assessment:                           - Prior to the procedure, a History and Physical                            was performed, and patient medications and                            allergies were reviewed. The patient's tolerance of                            previous anesthesia was also reviewed. The risks                            and benefits of the procedure and the sedation                            options and risks were discussed with the patient.                            All questions were answered, and informed consent                            was obtained. Prior Anticoagulants: The patient has                            taken no previous anticoagulant or antiplatelet                            agents. ASA Grade Assessment: III - A patient with                            severe systemic disease. After reviewing the risks  and benefits, the patient was deemed in                            satisfactory condition to undergo the procedure.                           After obtaining informed consent, the colonoscope                            was passed under direct vision. Throughout the                            procedure, the patient's blood pressure, pulse, and                             oxygen saturations were monitored continuously. The                            [id number]-[id number] ([id number]) scope was introduced through                            the anus and advanced to the the cecum, identified                            by appendiceal orifice and ileocecal valve. The                            ileocecal valve, appendiceal orifice, and rectum                            were photographed. Scope In: 12:12:43 PM Scope Out: 1:02:18 PM Scope Withdrawal Time: 0 hours 37 minutes 6 seconds  Total Procedure Duration: 0 hours 49 minutes 35 seconds  Findings:      The perianal and digital rectal examinations were normal. None,       elongated colon. Change in the patient's position and external abdominal       pressure required to reach the cecum.      Nine semi-pedunculated polyps were found in the descending colon and       cecum. The polyps were 4 to 9 mm in size. These polyps were removed with       a cold snare. Resection and retrieval were complete. Estimated blood       loss was minimal.      Scattered medium-mouthed diverticula were found in the sigmoid colon and       descending colon. 2 largest polyps were in the cecum. Both polypectomy       sites were closed with clip placement x1 to ensure good hemostasis.      Non-bleeding internal hemorrhoids were found during retroflexion. The       hemorrhoids were moderate, medium-sized and Grade I (internal       hemorrhoids that do not prolapse). Anal papilla also present.      The exam was otherwise without abnormality on direct and retroflexion       views. Impression:               -  Nine 4 to 9 mm polyps in the descending colon and                            in the cecum, removed with a cold snare. Resected                            and retrieved.                           - Diverticulosis in the sigmoid colon and in the                            descending colon.                           - Non-bleeding internal  hemorrhoids. Redundant                            colon.                           - The examination was otherwise normal on direct                            and retroflexion views. Moderate Sedation:      Moderate (conscious) sedation was personally administered by an       anesthesia professional. The following parameters were monitored: oxygen       saturation, heart rate, blood pressure, respiratory rate, EKG, adequacy       of pulmonary ventilation, and response to care. Recommendation:           - Patient has a contact number available for                            emergencies. The signs and symptoms of potential                            delayed complications were discussed with the                            patient. Return to normal activities tomorrow.                            Written discharge instructions were provided to the                            patient.                           - Resume previous diet.                           - Continue present medications.                           - Await pathology results.                           -  Repeat colonoscopy date to be determined after                            pending pathology results are reviewed for                            surveillance.                           - Return to GI office (date not yet determined). No                            MRI until clips gone. Procedure Code(s):        --- Professional ---                           [phone], Colonoscopy, flexible; with removal of                            tumor(s), polyp(s), or other lesion(s) by snare                            technique Diagnosis Code(s):        --- Professional ---                           K63.5, Polyp of colon                           K64.0, First degree hemorrhoids                           R19.5, Other fecal abnormalities                           K57.30, Diverticulosis of large intestine without                             perforation or abscess without bleeding CPT copyright 2019 American Medical Association. All rights reserved. The codes documented in this report are preliminary and upon coder review may  be revised to meet current compliance requirements. Gerrit Friends. Shamarie Call, MD Gennette Pac, MD 04/29/2020 1:16:09 PM This report has been signed electronically. Number of Addenda: 0

## 2020-04-30 ENCOUNTER — Encounter: Payer: Self-pay | Admitting: Internal Medicine

## 2020-04-30 LAB — SURGICAL PATHOLOGY

## 2020-05-02 ENCOUNTER — Encounter (HOSPITAL_COMMUNITY): Payer: Self-pay | Admitting: Internal Medicine

## 2020-05-23 ENCOUNTER — Other Ambulatory Visit: Payer: Self-pay | Admitting: Family Medicine

## 2020-06-18 ENCOUNTER — Other Ambulatory Visit: Payer: Self-pay

## 2020-06-18 DIAGNOSIS — I10 Essential (primary) hypertension: Secondary | ICD-10-CM

## 2020-06-18 MED ORDER — LOSARTAN POTASSIUM 100 MG PO TABS: 100.0000 mg | ORAL_TABLET | Freq: Every day | ORAL | 2 refills | Status: DC

## 2020-06-19 ENCOUNTER — Other Ambulatory Visit: Payer: Self-pay

## 2020-06-19 DIAGNOSIS — F418 Other specified anxiety disorders: Secondary | ICD-10-CM

## 2020-06-19 MED ORDER — BUSPIRONE HCL 10 MG PO TABS: 10.0000 mg | ORAL_TABLET | Freq: Three times a day (TID) | ORAL | 2 refills | Status: DC

## 2020-06-24 ENCOUNTER — Ambulatory Visit: Payer: Medicare HMO | Admitting: Family Medicine

## 2020-06-24 ENCOUNTER — Telehealth: Payer: Self-pay

## 2020-06-24 NOTE — Telephone Encounter (Signed)
Pt called stating that her COVID test came back positive.

## 2020-06-29 ENCOUNTER — Other Ambulatory Visit: Payer: Self-pay | Admitting: Family Medicine

## 2020-07-18 ENCOUNTER — Telehealth: Payer: Self-pay

## 2020-07-18 NOTE — Telephone Encounter (Signed)
Patient had covid and finished her quarantine period and is wanting to be tested again so she can be cleared. I called her back and gave her the number to call [phone] to schedule a covid test

## 2020-07-22 ENCOUNTER — Other Ambulatory Visit: Payer: Self-pay | Admitting: Family Medicine

## 2020-08-21 ENCOUNTER — Other Ambulatory Visit: Payer: Self-pay | Admitting: Cardiology

## 2020-08-22 ENCOUNTER — Other Ambulatory Visit: Payer: Self-pay | Admitting: Family Medicine

## 2020-09-18 ENCOUNTER — Telehealth (INDEPENDENT_AMBULATORY_CARE_PROVIDER_SITE_OTHER): Payer: Medicare HMO | Admitting: Family Medicine

## 2020-09-18 VITALS — BP 130/72 | Ht 64.5 in | Wt 193.0 lb

## 2020-09-18 DIAGNOSIS — E038 Other specified hypothyroidism: Secondary | ICD-10-CM | POA: Diagnosis not present

## 2020-09-18 DIAGNOSIS — F418 Other specified anxiety disorders: Secondary | ICD-10-CM | POA: Diagnosis not present

## 2020-09-18 DIAGNOSIS — Z1231 Encounter for screening mammogram for malignant neoplasm of breast: Secondary | ICD-10-CM

## 2020-09-18 DIAGNOSIS — M25511 Pain in right shoulder: Secondary | ICD-10-CM | POA: Insufficient documentation

## 2020-09-18 DIAGNOSIS — F17218 Nicotine dependence, cigarettes, with other nicotine-induced disorders: Secondary | ICD-10-CM

## 2020-09-18 DIAGNOSIS — I10 Essential (primary) hypertension: Secondary | ICD-10-CM

## 2020-09-18 DIAGNOSIS — G8929 Other chronic pain: Secondary | ICD-10-CM

## 2020-09-18 DIAGNOSIS — E785 Hyperlipidemia, unspecified: Secondary | ICD-10-CM

## 2020-09-18 MED ORDER — ALENDRONATE SODIUM 70 MG PO TABS: 70.0000 mg | ORAL_TABLET | ORAL | 11 refills | Status: DC

## 2020-09-18 NOTE — Patient Instructions (Signed)
Please call pt for flu vaccine in next 1 to 2 weeks.  Please schedule mammogram  Please schedule in office visit with me in December or January  Please get fasting lipid, cmp and EGFr, TSH, HBA1C in  Next 1 to 2 weeks  You are referred  To Dr Aline Brochure  I will also  refer you to Cardiology and Nephrology as we discussed  Be careful not to fall  Thanks for choosing [facility], we consider it a privelige to serve you.

## 2020-09-18 NOTE — Assessment & Plan Note (Signed)
Rated at a 10, refer to orthoi

## 2020-09-18 NOTE — Progress Notes (Signed)
Virtual Visit via Telephone Note  I connected with Tammy Suarez on 09/18/20 at 10:00 AM EDT by telephone and verified that I am speaking with the correct person using two identifiers.  Location: Patient:home Provider: home office   I discussed the limitations, risks, security and privacy concerns of performing an evaluation and management service by telephone and the availability of in person appointments. I also discussed with the patient that there may be a patient responsible charge related to this service. The patient expressed understanding and agreed to proceed.   History of Present Illness: 5 month h/o right breast pain daily  right elbow pain and grinding C/o fatigue and poor exercise Observations/Objective:  BP 130/72   Ht 5' 4.5" (1.638 m)   Wt 193 lb (87.5 kg)   BMI 32.62 kg/m  Good communication with no confusion and intact memory. Alert and oriented x 3 No signs of respiratory distress during speech   Assessment and Plan: Right shoulder pain Rated at a 10, refer to orthoi  Essential hypertension, benign DASH diet and commitment to daily physical activity for a minimum of 30 minutes discussed and encouraged, as a part of hypertension management. The importance of attaining a healthy weight is also discussed.  BP/Weight 09/18/2020 04/29/2020 04/25/2020 04/09/2020 03/22/2020 02/16/2020 0/88/1103  Systolic BP 159 458 592 924 462 863 817  Diastolic BP 72 79 75 72 72 72 80  Wt. (Lbs) 193 198 198 185 185 197 196.8  BMI 32.62 32.95 32.95 30.79 30.79 32.78 32.75  Controlled, no change in medication      Hyperlipidemia with target LDL less than 70 Hyperlipidemia:Low fat diet discussed and encouraged.   Lipid Panel  Lab Results  Component Value Date   CHOL 93 12/25/2019   HDL 18 (L) 12/25/2019   LDLCALC 51 12/25/2019   TRIG 160 (H) 12/25/2019   CHOLHDL 5.2 (H) 12/25/2019   Uncontrolled  Updated lab needed at/ before next visit.     Morbid  obesity  Patient re-educated about  the importance of commitment to a  minimum of 150 minutes of exercise per week as able.  The importance of healthy food choices with portion control discussed, as well as eating regularly and within a 12 hour window most days. The need to choose "clean , green" food 50 to 75% of the time is discussed, as well as to make water the primary drink and set a goal of 64 ounces water daily.    Weight /BMI 09/18/2020 04/29/2020 04/25/2020  WEIGHT 193 lb 198 lb 198 lb  HEIGHT 5' 4.5" 5\' 5"  5\' 5"   BMI 32.62 kg/m2 32.95 kg/m2 32.95 kg/m2      Hypothyroidism Updated lab needed at/ before next visit.   Depression with anxiety Controlled, no change in medication   DM (diabetes mellitus), type 2 with renal complications ([place]) Tammy Suarez is reminded of the importance of commitment to daily physical activity for 30 minutes or more, as able and the need to limit carbohydrate intake to 30 to 60 grams per meal to help with blood sugar control.     Tammy Suarez is reminded of the importance of daily foot exam, annual eye examination, and good blood sugar, blood pressure and cholesterol control.  Diabetic Labs Latest Ref Rng & Units 04/25/2020 12/25/2019 10/05/2019 09/29/2019 09/20/2019  HbA1c <5.7 % of total Hgb - 6.1(H) - - -  Microalbumin mg/dL - - - - -  Micro/Creat Ratio <30 mcg/mg creat - - - - -  Chol <  200 mg/dL - 93 - - 95  HDL > OR = 50 mg/dL - 18(L) - - 16(L)  Calc LDL mg/dL (calc) - 51 - - 50  Triglycerides <150 mg/dL - 160(H) - - 230(H)  Creatinine 0.44 - 1.00 mg/dL 1.55(H) 2.04(H) 1.95(H) 1.62(H) 2.35(H)   BP/Weight 09/18/2020 04/29/2020 04/25/2020 04/09/2020 03/22/2020 02/16/2020 3/32/9518  Systolic BP 841 660 630 160 109 323 557  Diastolic BP 72 79 75 72 72 72 80  Wt. (Lbs) 193 198 198 185 185 197 196.8  BMI 32.62 32.95 32.95 30.79 30.79 32.78 32.75   Foot/eye exam completion dates Latest Ref Rng & Units 01/23/2020 08/24/2019  Eye Exam No Retinopathy No  Retinopathy -  Foot exam Order - - -  Foot Form Completion - - Done        Nicotine dependence Asked:confirms currently smokes cigarettes 10/day Assess: Unwilling to set a quit date, but is cutting back Advise: needs to QUIT to reduce risk of cancer, cardio and cerebrovascular disease Assist: counseled for 5 minutes and literature provided Arrange: follow up in 2 to 4 months      Follow Up Instructions:    I discussed the assessment and treatment plan with the patient. The patient was provided an opportunity to ask questions and all were answered. The patient agreed with the plan and demonstrated an understanding of the instructions.   The patient was advised to call back or seek an in-person evaluation if the symptoms worsen or if the condition fails to improve as anticipated.  I provided 20 minutes of non-face-to-face time during this encounter.   Tula Nakayama, MD

## 2020-09-20 ENCOUNTER — Other Ambulatory Visit: Payer: Self-pay | Admitting: Family Medicine

## 2020-09-22 ENCOUNTER — Encounter: Payer: Self-pay | Admitting: Family Medicine

## 2020-09-22 NOTE — Assessment & Plan Note (Signed)
Asked:confirms currently smokes cigarettes 10/day Assess: Unwilling to set a quit date, but is cutting back Advise: needs to QUIT to reduce risk of cancer, cardio and cerebrovascular disease Assist: counseled for 5 minutes and literature provided Arrange: follow up in 2 to 4 months  

## 2020-09-22 NOTE — Assessment & Plan Note (Signed)
Updated lab needed at/ before next visit.   

## 2020-09-22 NOTE — Assessment & Plan Note (Signed)
DASH diet and commitment to daily physical activity for a minimum of 30 minutes discussed and encouraged, as a part of hypertension management. The importance of attaining a healthy weight is also discussed.  BP/Weight 09/18/2020 04/29/2020 04/25/2020 04/09/2020 03/22/2020 02/16/2020 5/42/3702  Systolic BP 301 720 910 681 661 969 409  Diastolic BP 72 79 75 72 72 72 80  Wt. (Lbs) 193 198 198 185 185 197 196.8  BMI 32.62 32.95 32.95 30.79 30.79 32.78 32.75  Controlled, no change in medication

## 2020-09-22 NOTE — Assessment & Plan Note (Signed)
Controlled, no change in medication  

## 2020-09-22 NOTE — Assessment & Plan Note (Signed)
  Patient re-educated about  the importance of commitment to a  minimum of 150 minutes of exercise per week as able.  The importance of healthy food choices with portion control discussed, as well as eating regularly and within a 12 hour window most days. The need to choose "clean , green" food 50 to 75% of the time is discussed, as well as to make water the primary drink and set a goal of 64 ounces water daily.    Weight /BMI 09/18/2020 04/29/2020 04/25/2020  WEIGHT 193 lb 198 lb 198 lb  HEIGHT 5' 4.5" 5\' 5"  5\' 5"   BMI 32.62 kg/m2 32.95 kg/m2 32.95 kg/m2

## 2020-09-22 NOTE — Assessment & Plan Note (Signed)
Hyperlipidemia:Low fat diet discussed and encouraged.   Lipid Panel  Lab Results  Component Value Date   CHOL 93 12/25/2019   HDL 18 (L) 12/25/2019   LDLCALC 51 12/25/2019   TRIG 160 (H) 12/25/2019   CHOLHDL 5.2 (H) 12/25/2019   Uncontrolled  Updated lab needed at/ before next visit.

## 2020-09-22 NOTE — Assessment & Plan Note (Signed)
Tammy Suarez is reminded of the importance of commitment to daily physical activity for 30 minutes or more, as able and the need to limit carbohydrate intake to 30 to 60 grams per meal to help with blood sugar control.     Tammy Suarez is reminded of the importance of daily foot exam, annual eye examination, and good blood sugar, blood pressure and cholesterol control.  Diabetic Labs Latest Ref Rng & Units 04/25/2020 12/25/2019 10/05/2019 09/29/2019 09/20/2019  HbA1c <5.7 % of total Hgb - 6.1(H) - - -  Microalbumin mg/dL - - - - -  Micro/Creat Ratio <30 mcg/mg creat - - - - -  Chol <200 mg/dL - 93 - - 95  HDL > OR = 50 mg/dL - 18(L) - - 16(L)  Calc LDL mg/dL (calc) - 51 - - 50  Triglycerides <150 mg/dL - 160(H) - - 230(H)  Creatinine 0.44 - 1.00 mg/dL 1.55(H) 2.04(H) 1.95(H) 1.62(H) 2.35(H)   BP/Weight 09/18/2020 04/29/2020 04/25/2020 04/09/2020 03/22/2020 02/16/2020 0/63/4949  Systolic BP 447 395 844 171 278 718 367  Diastolic BP 72 79 75 72 72 72 80  Wt. (Lbs) 193 198 198 185 185 197 196.8  BMI 32.62 32.95 32.95 30.79 30.79 32.78 32.75   Foot/eye exam completion dates Latest Ref Rng & Units 01/23/2020 08/24/2019  Eye Exam No Retinopathy No Retinopathy -  Foot exam Order - - -  Foot Form Completion - - Done

## 2020-10-02 ENCOUNTER — Other Ambulatory Visit: Payer: Self-pay

## 2020-10-02 DIAGNOSIS — E785 Hyperlipidemia, unspecified: Secondary | ICD-10-CM

## 2020-10-02 DIAGNOSIS — I10 Essential (primary) hypertension: Secondary | ICD-10-CM

## 2020-10-02 DIAGNOSIS — E038 Other specified hypothyroidism: Secondary | ICD-10-CM

## 2020-10-02 DIAGNOSIS — E1121 Type 2 diabetes mellitus with diabetic nephropathy: Secondary | ICD-10-CM

## 2020-10-04 ENCOUNTER — Other Ambulatory Visit: Payer: Self-pay

## 2020-10-04 ENCOUNTER — Ambulatory Visit (INDEPENDENT_AMBULATORY_CARE_PROVIDER_SITE_OTHER): Payer: Medicare HMO

## 2020-10-04 DIAGNOSIS — Z23 Encounter for immunization: Secondary | ICD-10-CM

## 2020-10-04 MED ORDER — ROSUVASTATIN CALCIUM 40 MG PO TABS
ORAL_TABLET | ORAL | 2 refills | Status: DC
Start: 2020-10-04 — End: 2021-07-22

## 2020-10-05 LAB — CMP14+EGFR
ALT: 11 IU/L (ref 0–32)
AST: 30 IU/L (ref 0–40)
Albumin/Globulin Ratio: 1.4 (ref 1.2–2.2)
Albumin: 3.8 g/dL (ref 3.7–4.7)
Alkaline Phosphatase: 70 IU/L (ref 44–121)
BUN/Creatinine Ratio: 18 (ref 12–28)
BUN: 30 mg/dL — ABNORMAL HIGH (ref 8–27)
Bilirubin Total: 0.3 mg/dL (ref 0.0–1.2)
CO2: 24 mmol/L (ref 20–29)
Calcium: 10.5 mg/dL — ABNORMAL HIGH (ref 8.7–10.3)
Chloride: 101 mmol/L (ref 96–106)
Creatinine, Ser: 1.65 mg/dL — ABNORMAL HIGH (ref 0.57–1.00)
GFR calc Af Amer: 35 mL/min/{1.73_m2} — ABNORMAL LOW (ref >59)
GFR calc non Af Amer: 30 mL/min/{1.73_m2} — ABNORMAL LOW (ref >59)
Globulin, Total: 2.7 g/dL (ref 1.5–4.5)
Glucose: 135 mg/dL — ABNORMAL HIGH (ref 65–99)
Potassium: 5.1 mmol/L (ref 3.5–5.2)
Sodium: 139 mmol/L (ref 134–144)
Total Protein: 6.5 g/dL (ref 6.0–8.5)

## 2020-10-05 LAB — LIPID PANEL
Chol/HDL Ratio: 5.1 ratio — ABNORMAL HIGH (ref 0.0–4.4)
Cholesterol, Total: 81 mg/dL — ABNORMAL LOW (ref 100–199)
HDL: 16 mg/dL — ABNORMAL LOW (ref >39)
LDL Chol Calc (NIH): 34 mg/dL (ref 0–99)
Triglycerides: 185 mg/dL — ABNORMAL HIGH (ref 0–149)
VLDL Cholesterol Cal: 31 mg/dL (ref 5–40)

## 2020-10-05 LAB — HEMOGLOBIN A1C
Est. average glucose Bld gHb Est-mCnc: 157 mg/dL
Hgb A1c MFr Bld: 7.1 % — ABNORMAL HIGH (ref 4.8–5.6)

## 2020-10-05 LAB — TSH: TSH: 1.79 u[IU]/mL (ref 0.450–4.500)

## 2020-10-20 ENCOUNTER — Other Ambulatory Visit: Payer: Self-pay | Admitting: Family Medicine

## 2020-10-21 ENCOUNTER — Ambulatory Visit: Payer: Medicare HMO

## 2020-10-21 ENCOUNTER — Ambulatory Visit (INDEPENDENT_AMBULATORY_CARE_PROVIDER_SITE_OTHER): Payer: Medicare HMO | Admitting: Orthopedic Surgery

## 2020-10-21 ENCOUNTER — Encounter: Payer: Self-pay | Admitting: Orthopedic Surgery

## 2020-10-21 ENCOUNTER — Other Ambulatory Visit: Payer: Self-pay

## 2020-10-21 VITALS — BP 148/98 | HR 87 | Ht 64.5 in | Wt 193.0 lb

## 2020-10-21 DIAGNOSIS — S42294D Other nondisplaced fracture of upper end of right humerus, subsequent encounter for fracture with routine healing: Secondary | ICD-10-CM

## 2020-10-21 DIAGNOSIS — M542 Cervicalgia: Secondary | ICD-10-CM | POA: Diagnosis not present

## 2020-10-21 DIAGNOSIS — M4712 Other spondylosis with myelopathy, cervical region: Secondary | ICD-10-CM | POA: Diagnosis not present

## 2020-10-21 DIAGNOSIS — M79601 Pain in right arm: Secondary | ICD-10-CM

## 2020-10-21 NOTE — Progress Notes (Signed)
NEW PROBLEM//OFFICE VISIT  Chief Complaint  Patient presents with  . Arm Pain    right arm pain/  fingers go numb pain from neck down to hand    74 year old female had a right proximal humerus fracture treated with closed means  She developed stiffness in the right shoulder which affected her activities of daily living however  She continued to have pain in the right arm radiating down to the right hand associated with numbness and tingling mainly of the index long ring and small finger with occasional numbness and tingling in the left upper extremity  She reports clumsiness with small objects she is dropping things and cannot hold on and she is also clumsy when she walks  She is status post posterior cervical surgery 15 to 20 years ago   Review of Systems  Constitutional: Negative for fever.  Skin: Negative.   Neurological: Positive for tingling, focal weakness and weakness.     Past Medical History:  Diagnosis Date  . Annual physical exam 05/31/2016  . Arthritis   . Carpal tunnel syndrome    bilaterally  . Chronic back pain   . CKD (chronic kidney disease) stage 3, GFR 30-59 ml/min (HCC)   . Coronary atherosclerosis of native coronary artery    Multivessel status post CABG - LIMA to LAD, SVG to OM and SVG to PDA  . Depression   . Economic stress 04/25/2019  . Essential hypertension   . GERD (gastroesophageal reflux disease)   . Headache   . History of conjunctivitis   . History of hiatal hernia   . Hyperlipidemia   . Hypothyroidism   . Myocardial infarction ([place]) 1999  . Neck pain 01/11/2017  . Obesity   . Shoulder pain, left 04/22/2017  . Type 2 diabetes mellitus ([place])   . Uterine cancer ([place])   . Vitamin B 12 deficiency     Past Surgical History:  Procedure Laterality Date  . ABDOMINAL HYSTERECTOMY    . BACK SURGERY    . CARDIAC SURGERY    . CARPAL TUNNEL RELEASE Left 07/23/2016   Procedure: CARPAL TUNNEL RELEASE;  Surgeon: Carole Civil, MD;  Location: [facility]  ORS;  Service: Orthopedics;  Laterality: Left;  . CERVICAL LAMINECTOMY    . CHOLECYSTECTOMY    . COLONOSCOPY  2007   Dr. Irving Shows: reported to be normal  . COLONOSCOPY WITH PROPOFOL N/A 04/29/2020   Procedure: COLONOSCOPY WITH PROPOFOL;  Surgeon: Daneil Dolin, MD;  Location: [facility] END[facility] SUITE;  Service: Endoscopy;  Laterality: N/A;  12:45pm  . CORONARY ARTERY BYPASS GRAFT N/A 11/02/2013   Procedure: CORONARY ARTERY BYPASS GRAFTING (CABG);  Surgeon: Gaye Pollack, MD;  Location: [facility];  Service: Open Heart Surgery;  Laterality: N/A;  CABG x three, using left internal mammary artery and right leg greater saphenous vein harvested endoscopically  . COSMETIC SURGERY  07/01/2012   Recurrent skin infection on lower abdomen, [facility]  . HERNIA REPAIR    . Inguinal herniorrhaphy    . INTRAOPERATIVE TRANSESOPHAGEAL ECHOCARDIOGRAM N/A 11/02/2013   Procedure: INTRAOPERATIVE TRANSESOPHAGEAL ECHOCARDIOGRAM;  Surgeon: Gaye Pollack, MD;  Location: [facility] OR;  Service: Open Heart Surgery;  Laterality: N/A;  . LAPAROSCOPIC GASTRIC BANDING  2009  . LEFT HEART CATHETERIZATION WITH CORONARY ANGIOGRAM N/A 10/30/2013   Procedure: LEFT HEART CATHETERIZATION WITH CORONARY ANGIOGRAM;  Surgeon: Minus Breeding, MD;  Location: [facility] CATH LAB;  Service: Cardiovascular;  Laterality: N/A;  . Left index finger repair    . LUMBAR FUSION    .  POLYPECTOMY  04/29/2020   Procedure: POLYPECTOMY;  Surgeon: Daneil Dolin, MD;  [facility];  Service: Endoscopy;;  . SPINE SURGERY    . VESICOVAGINAL FISTULA CLOSURE W/ TAH      Family History  Problem Relation Age of Onset  . Heart attack Mother   . Heart attack Father   . Hypertension Sister   . Heart attack Sister   . Cancer Brother        Renal cell   . Hepatitis Daughter   . Drug abuse Daughter   . Hypertension Daughter   . Colon cancer Neg Hx    Social History   Tobacco Use  . Smoking status: Current Every Day Smoker    Packs/day: 0.25    Years: 50.00     Pack years: 12.50    Types: Cigarettes  . Smokeless tobacco: Never Used  . Tobacco comment: smokes about 10 a day   Vaping Use  . Vaping Use: Never used  Substance Use Topics  . Alcohol use: No    Alcohol/week: 0.0 standard drinks  . Drug use: No    Allergies  Allergen Reactions  . Gabapentin Other (See Comments)    Feels dizzy  . Meperidine Hcl Nausea And Vomiting  . Niacin Other (See Comments)    REACTION: face peeling and burning  . Propoxyphene N-Acetaminophen Nausea And Vomiting  . Hydrocodone-Acetaminophen Nausea And Vomiting    Current Meds  Medication Sig  . alendronate (FOSAMAX) 70 MG tablet Take 1 tablet (70 mg total) by mouth every 7 (seven) days. Take with a full glass of water on an empty stomach.  . busPIRone (BUSPAR) 10 MG tablet Take 1 tablet (10 mg total) by mouth 3 (three) times daily.  . Choline Fenofibrate 45 MG capsule Take 1 capsule (45 mg total) by mouth daily.  . DULoxetine (CYMBALTA) 60 MG capsule Take 1 capsule (60 mg total) by mouth 2 (two) times daily.  Marland Kitchen ezetimibe (ZETIA) 10 MG tablet Take 1 tablet (10 mg total) by mouth daily.  . famotidine (PEPCID) 20 MG tablet TAKE 1 TABLET(20 MG) BY MOUTH TWICE DAILY  . fenofibrate (TRICOR) 145 MG tablet   . isosorbide mononitrate (IMDUR) 30 MG 24 hr tablet TAKE 1 TABLET(30 MG) BY MOUTH AT BEDTIME  . levothyroxine (SYNTHROID) 100 MCG tablet Take one tab thurs thru Sunday and half tab Mon thru wed  . losartan (COZAAR) 100 MG tablet Take 1 tablet (100 mg total) by mouth daily.  Marland Kitchen losartan (COZAAR) 50 MG tablet TAKE 1 TABLET(50 MG) BY MOUTH DAILY  . metoprolol succinate (TOPROL-XL) 25 MG 24 hr tablet Take 1 tablet (25 mg total) by mouth daily.  . rosuvastatin (CRESTOR) 40 MG tablet TAKE 1 TABLET(40 MG) BY MOUTH DAILY    BP (!) 148/98   Pulse 87   Ht 5' 4.5" (1.638 m)   Wt 193 lb (87.5 kg)   BMI 32.62 kg/m   Physical Exam She is awake and alert oriented x3 mood is pleasant affect is normal Ortho Exam She  has a posterior scar from a posterior cervical fusion it appears to be low on the cervical spine  She has decreased range of motion and crepitance and pain on range of motion  She has positive Lhermitte sign and positive Spurling sign on the right  She has weak grip bilaterally right worse than left  She has decreased range of motion of the right shoulder compared to the left  She has weakness C7  on the right normal on the left  No atrophy is noted in the hands or wrist or forearms either side   MEDICAL DECISION MAKING  A.  Encounter Diagnoses  Name Primary?  . Right arm pain   . Neck pain   . Other spondylosis with myelopathy, cervical region Yes  . Other closed nondisplaced fracture of proximal end of right humerus with routine healing, subsequent encounter 04/29/19     B. DATA ANALYSED:   IMAGING: Interpretation of images: Her x-ray shows straightening of the cervical spine in terms of lordosis, she has osteophytes anteriorly from C4-C8 with narrowing of C6 and 7 and subluxation of C7 on C8  Orders: MRI cervical spine  Outside records reviewed: none   C. MANAGEMENT   Recommend MRI and then appropriate referral patient will follow up here to get the results of the scan and make further treatment recommendations at that time  Encounter Diagnoses  Name Primary?  . Right arm pain   . Neck pain   . Other spondylosis with myelopathy, cervical region Yes  . Other closed nondisplaced fracture of proximal end of right humerus with routine healing, subsequent encounter 04/29/19      No orders of the defined types were placed in this encounter.     Arther Abbott, MD  10/21/2020 10:04 AM

## 2020-11-01 ENCOUNTER — Ambulatory Visit (HOSPITAL_COMMUNITY)
Admission: RE | Admit: 2020-11-01 | Discharge: 2020-11-01 | Disposition: A | Discharge status: Home / Self Care | Payer: Medicare HMO | Source: Ambulatory Visit | Attending: Orthopedic Surgery | Admitting: Orthopedic Surgery

## 2020-11-01 ENCOUNTER — Other Ambulatory Visit: Payer: Self-pay

## 2020-11-01 DIAGNOSIS — M542 Cervicalgia: Secondary | ICD-10-CM | POA: Diagnosis present

## 2020-11-01 DIAGNOSIS — M4712 Other spondylosis with myelopathy, cervical region: Secondary | ICD-10-CM | POA: Insufficient documentation

## 2020-11-19 ENCOUNTER — Other Ambulatory Visit: Payer: Self-pay | Admitting: Family Medicine

## 2020-11-19 ENCOUNTER — Ambulatory Visit: Payer: Medicare HMO | Admitting: Family Medicine

## 2020-11-25 ENCOUNTER — Other Ambulatory Visit (HOSPITAL_COMMUNITY): Payer: Self-pay | Admitting: Family Medicine

## 2020-11-25 ENCOUNTER — Other Ambulatory Visit: Payer: Self-pay

## 2020-11-25 ENCOUNTER — Ambulatory Visit (HOSPITAL_COMMUNITY)
Admission: RE | Admit: 2020-11-25 | Discharge: 2020-11-25 | Disposition: A | Discharge status: Home / Self Care | Payer: Medicare HMO | Source: Ambulatory Visit | Attending: Family Medicine | Admitting: Family Medicine

## 2020-11-25 DIAGNOSIS — R928 Other abnormal and inconclusive findings on diagnostic imaging of breast: Secondary | ICD-10-CM

## 2020-11-25 DIAGNOSIS — Z1231 Encounter for screening mammogram for malignant neoplasm of breast: Secondary | ICD-10-CM | POA: Diagnosis present

## 2020-12-10 ENCOUNTER — Other Ambulatory Visit: Payer: Self-pay

## 2020-12-10 ENCOUNTER — Encounter: Payer: Self-pay | Admitting: Family Medicine

## 2020-12-10 ENCOUNTER — Telehealth (INDEPENDENT_AMBULATORY_CARE_PROVIDER_SITE_OTHER): Payer: Medicare HMO | Admitting: Family Medicine

## 2020-12-10 VITALS — BP 170/69 | Ht 65.0 in | Wt 197.0 lb

## 2020-12-10 DIAGNOSIS — E038 Other specified hypothyroidism: Secondary | ICD-10-CM

## 2020-12-10 DIAGNOSIS — F17218 Nicotine dependence, cigarettes, with other nicotine-induced disorders: Secondary | ICD-10-CM | POA: Diagnosis not present

## 2020-12-10 DIAGNOSIS — F418 Other specified anxiety disorders: Secondary | ICD-10-CM | POA: Diagnosis not present

## 2020-12-10 DIAGNOSIS — I10 Essential (primary) hypertension: Secondary | ICD-10-CM | POA: Diagnosis not present

## 2020-12-10 DIAGNOSIS — M47812 Spondylosis without myelopathy or radiculopathy, cervical region: Secondary | ICD-10-CM

## 2020-12-10 DIAGNOSIS — E669 Obesity, unspecified: Secondary | ICD-10-CM

## 2020-12-10 DIAGNOSIS — E785 Hyperlipidemia, unspecified: Secondary | ICD-10-CM

## 2020-12-10 DIAGNOSIS — E1121 Type 2 diabetes mellitus with diabetic nephropathy: Secondary | ICD-10-CM | POA: Diagnosis not present

## 2020-12-10 DIAGNOSIS — R296 Repeated falls: Secondary | ICD-10-CM

## 2020-12-10 DIAGNOSIS — E559 Vitamin D deficiency, unspecified: Secondary | ICD-10-CM

## 2020-12-10 MED ORDER — EZETIMIBE 10 MG PO TABS: 10.0000 mg | ORAL_TABLET | Freq: Every day | ORAL | 1 refills | Status: DC

## 2020-12-10 MED ORDER — LOSARTAN POTASSIUM 100 MG PO TABS: 100.0000 mg | ORAL_TABLET | Freq: Every day | ORAL | 1 refills | Status: DC

## 2020-12-10 MED ORDER — FAMOTIDINE 20 MG PO TABS: ORAL_TABLET | ORAL | 1 refills | Status: DC

## 2020-12-10 MED ORDER — BUSPIRONE HCL 10 MG PO TABS: 10.0000 mg | ORAL_TABLET | Freq: Three times a day (TID) | ORAL | 2 refills | Status: DC

## 2020-12-10 MED ORDER — FENOFIBRATE 145 MG PO TABS: 145.0000 mg | ORAL_TABLET | Freq: Every day | ORAL | 1 refills | Status: DC

## 2020-12-10 NOTE — Progress Notes (Signed)
Virtual Visit via Telephone Note  I connected with Tammy Suarez on 12/10/20 at 10:00 AM EST by telephone and verified that I am speaking with the correct person using two identifiers.  Location: Patient: home Provider: work   I discussed the limitations, risks, security and privacy concerns of performing an evaluation and management service by telephone and the availability of in person appointments. I also discussed with the patient that there may be a patient responsible charge related to this service. The patient expressed understanding and agreed to proceed.   History of Present Illness: F/U chronic problems, update labs, medication and routine health Denies recent fever or chills. Denies sinus pressure, nasal congestion, ear pain or sore throat. Denies chest congestion, productive cough or wheezing. Denies chest pains, palpitations and leg swelling Denies abdominal pain, nausea, vomiting,diarrhea or constipation.   Denies dysuria, frequency, hesitancy or incontinence. C/o chronic joint pain, swelling and limitation in mobility.Fell in December in her home Denies headaches, seizures, numbness, or tingling. Denies uncontrolled epression, anxiety or insomnia. Denies skin break down or rash.       Observations/Objective: BP (!) 170/69   Ht 5\' 5"  (1.651 m)   Wt 197 lb (89.4 kg)   HC 9" (22.9 cm)   BMI 32.78 kg/m  Good communication with no confusion and intact memory. Alert and oriented x 3 No signs of respiratory distress during speech    Assessment and Plan: Essential hypertension, benign Uncontrolled, increase dose of cozaar DASH diet and commitment to daily physical activity for a minimum of 30 minutes discussed and encouraged, as a part of hypertension management. The importance of attaining a healthy weight is also discussed.  BP/Weight 12/10/2020 10/21/2020 09/18/2020 04/29/2020 04/25/2020 03/25/2584 12/23/7822  Systolic BP 235 361 443 154 008 676 195  Diastolic BP  69 98 72 79 75 72 72  Wt. (Lbs) 197 193 193 198 198 185 185  BMI 32.78 32.62 32.62 32.95 32.95 30.79 30.79       DM (diabetes mellitus), type 2 with renal complications ([place]) [name] is reminded of the importance of commitment to daily physical activity for 30 minutes or more, as able and the need to limit carbohydrate intake to 30 to 60 grams per meal to help with blood sugar control.   Updated lab needed at/ before next visit.   [name] is reminded of the importance of daily foot exam, annual eye examination, and good blood sugar, blood pressure and cholesterol control.  Diabetic Labs Latest Ref Rng & Units 10/04/2020 04/25/2020 12/25/2019 10/05/2019 09/29/2019  HbA1c 4.8 - 5.6 % 7.1(H) - 6.1(H) - -  Microalbumin mg/dL - - - - -  Micro/Creat Ratio <30 mcg/mg creat - - - - -  Chol 100 - 199 mg/dL 81(L) - 93 - -  HDL >39 mg/dL 16(L) - 18(L) - -  Calc LDL 0 - 99 mg/dL 34 - 51 - -  Triglycerides 0 - 149 mg/dL 185(H) - 160(H) - -  Creatinine 0.57 - 1.00 mg/dL 1.65(H) 1.55(H) 2.04(H) 1.95(H) 1.62(H)   BP/Weight 12/10/2020 10/21/2020 09/18/2020 04/29/2020 04/25/2020 0/93/2671 12/21/5807  Systolic BP 983 382 505 397 673 419 379  Diastolic BP 69 98 72 79 75 72 72  Wt. (Lbs) 197 193 193 198 198 185 185  BMI 32.78 32.62 32.62 32.95 32.95 30.79 30.79   Foot/eye exam completion dates Latest Ref Rng & Units 01/23/2020 08/24/2019  Eye Exam No Retinopathy No Retinopathy -  Foot exam Order - - -  Foot  Form Completion - - Done        Depression with anxiety Controlled, no change in medication   Nicotine dependence Asked:confirms currently smokes cigarettes Assess: Unwilling to set a quit date, but is cutting back Advise: needs to QUIT to reduce risk of cancer, cardio and cerebrovascular disease Assist: counseled for 5 minutes and literature provided Arrange: follow up in 2 to 4 months   Obesity (BMI 30.0-34.9)  Patient re-educated about  the importance of commitment to a  minimum  of 150 minutes of exercise per week as able.  The importance of healthy food choices with portion control discussed, as well as eating regularly and within a 12 hour window most days. The need to choose "clean , green" food 50 to 75% of the time is discussed, as well as to make water the primary drink and set a goal of 64 ounces water daily.    Weight /BMI 12/10/2020 10/21/2020 09/18/2020  WEIGHT 197 lb 193 lb 193 lb  HEIGHT 5\' 5"  5' 4.5" 5' 4.5"  BMI 32.78 kg/m2 32.62 kg/m2 32.62 kg/m2      Hyperlipidemia with target LDL less than 70 Hyperlipidemia:Low fat diet discussed and encouraged.   Lipid Panel  Lab Results  Component Value Date   CHOL 81 (L) 10/04/2020   HDL 16 (L) 10/04/2020   LDLCALC 34 10/04/2020   TRIG 185 (H) 10/04/2020   CHOLHDL 5.1 (H) 10/04/2020     Updated lab needed at/ before next visit. Not at goal  Hypothyroidism Updated lab needed at/ before next visit.   Recurrent falls Home safety discussed     Follow Up Instructions:    I discussed the assessment and treatment plan with the patient. The patient was provided an opportunity to ask questions and all were answered. The patient agreed with the plan and demonstrated an understanding of the instructions.   The patient was advised to call back or seek an in-person evaluation if the symptoms worsen or if the condition fails to improve as anticipated.  I provided 25  minutes of non-face-to-face time during this encounter.   Tula Nakayama, MD

## 2020-12-10 NOTE — Patient Instructions (Addendum)
F/U in office re evaluate uncontrolled blood pressure 2nd week in March, call if you need me sooner  F/U with Dr Moshe Cipro July 2022, call if you need me sooner  New dose of cozaar is 100 mg one daily  Please work consistently on stopping smoking to [employer], call [id number] for help  Be careful not to fall  Please get fasting lipid, cmp and EGFr, HBA1C, TSH and vit D Feb 28 or after  Think about what you will eat, plan ahead. Choose " clean, green, fresh or frozen" over canned, processed or packaged foods which are more sugary, salty and fatty. 70 to 75% of food eaten should be vegetables and fruit. Three meals at set times with snacks allowed between meals, but they must be fruit or vegetables. Aim to eat over a 12 hour period , example 7 am to 7 pm, and STOP after  your last meal of the day. Drink water,generally about 64 ounces per day, no other drink is as healthy. Fruit juice is best enjoyed in a healthy way, by EATING the fruit. It is important that you exercise regularly at least 30 minutes 5 times a week. If you develop chest pain, have severe difficulty breathing, or feel very tired, stop exercising immediately and seek medical attention

## 2020-12-11 NOTE — Assessment & Plan Note (Signed)
Home safety discussed 

## 2020-12-11 NOTE — Assessment & Plan Note (Signed)
  Patient re-educated about  the importance of commitment to a  minimum of 150 minutes of exercise per week as able.  The importance of healthy food choices with portion control discussed, as well as eating regularly and within a 12 hour window most days. The need to choose "clean , green" food 50 to 75% of the time is discussed, as well as to make water the primary drink and set a goal of 64 ounces water daily.    Weight /BMI 12/10/2020 10/21/2020 09/18/2020  WEIGHT 197 lb 193 lb 193 lb  HEIGHT 5\' 5"  5' 4.5" 5' 4.5"  BMI 32.78 kg/m2 32.62 kg/m2 32.62 kg/m2

## 2020-12-11 NOTE — Assessment & Plan Note (Signed)
Updated lab needed at/ before next visit.   

## 2020-12-11 NOTE — Assessment & Plan Note (Signed)
Asked:confirms currently smokes cigarettes °Assess: Unwilling to set a quit date, but is cutting back °Advise: needs to QUIT to reduce risk of cancer, cardio and cerebrovascular disease °Assist: counseled for 5 minutes and literature provided °Arrange: follow up in 2 to 4 months ° °

## 2020-12-11 NOTE — Assessment & Plan Note (Signed)
Tammy Suarez is reminded of the importance of commitment to daily physical activity for 30 minutes or more, as able and the need to limit carbohydrate intake to 30 to 60 grams per meal to help with blood sugar control.   Updated lab needed at/ before next visit.   Tammy Suarez is reminded of the importance of daily foot exam, annual eye examination, and good blood sugar, blood pressure and cholesterol control.  Diabetic Labs Latest Ref Rng & Units 10/04/2020 04/25/2020 12/25/2019 10/05/2019 09/29/2019  HbA1c 4.8 - 5.6 % 7.1(H) - 6.1(H) - -  Microalbumin mg/dL - - - - -  Micro/Creat Ratio <30 mcg/mg creat - - - - -  Chol 100 - 199 mg/dL 81(L) - 93 - -  HDL >39 mg/dL 16(L) - 18(L) - -  Calc LDL 0 - 99 mg/dL 34 - 51 - -  Triglycerides 0 - 149 mg/dL 185(H) - 160(H) - -  Creatinine 0.57 - 1.00 mg/dL 1.65(H) 1.55(H) 2.04(H) 1.95(H) 1.62(H)   BP/Weight 12/10/2020 10/21/2020 09/18/2020 04/29/2020 04/25/2020 0/60/0459 07/23/7413  Systolic BP 239 532 023 343 568 616 837  Diastolic BP 69 98 72 79 75 72 72  Wt. (Lbs) 197 193 193 198 198 185 185  BMI 32.78 32.62 32.62 32.95 32.95 30.79 30.79   Foot/eye exam completion dates Latest Ref Rng & Units 01/23/2020 08/24/2019  Eye Exam No Retinopathy No Retinopathy -  Foot exam Order - - -  Foot Form Completion - - Done

## 2020-12-11 NOTE — Assessment & Plan Note (Signed)
Hyperlipidemia:Low fat diet discussed and encouraged.   Lipid Panel  Lab Results  Component Value Date   CHOL 81 (L) 10/04/2020   HDL 16 (L) 10/04/2020   LDLCALC 34 10/04/2020   TRIG 185 (H) 10/04/2020   CHOLHDL 5.1 (H) 10/04/2020     Updated lab needed at/ before next visit. Not at goal

## 2020-12-11 NOTE — Assessment & Plan Note (Signed)
Controlled, no change in medication  

## 2020-12-11 NOTE — Assessment & Plan Note (Signed)
Uncontrolled, increase dose of cozaar DASH diet and commitment to daily physical activity for a minimum of 30 minutes discussed and encouraged, as a part of hypertension management. The importance of attaining a healthy weight is also discussed.  BP/Weight 12/10/2020 10/21/2020 09/18/2020 04/29/2020 04/25/2020 06/06/7736 5/0/5107  Systolic BP 125 247 998 001 239 359 409  Diastolic BP 69 98 72 79 75 72 72  Wt. (Lbs) 197 193 193 198 198 185 185  BMI 32.78 32.62 32.62 32.95 32.95 30.79 30.79

## 2020-12-17 ENCOUNTER — Ambulatory Visit (HOSPITAL_COMMUNITY)
Admission: RE | Admit: 2020-12-17 | Discharge: 2020-12-17 | Disposition: A | Discharge status: Home / Self Care | Payer: Medicare HMO | Source: Ambulatory Visit | Attending: Family Medicine | Admitting: Family Medicine

## 2020-12-17 ENCOUNTER — Other Ambulatory Visit: Payer: Self-pay

## 2020-12-17 DIAGNOSIS — R928 Other abnormal and inconclusive findings on diagnostic imaging of breast: Secondary | ICD-10-CM | POA: Diagnosis present

## 2020-12-24 ENCOUNTER — Other Ambulatory Visit: Payer: Self-pay | Admitting: Family Medicine

## 2021-01-03 ENCOUNTER — Encounter: Payer: Self-pay | Admitting: Orthopaedic Surgery

## 2021-01-04 ENCOUNTER — Other Ambulatory Visit: Payer: Self-pay | Admitting: Family Medicine

## 2021-01-08 ENCOUNTER — Encounter: Payer: Self-pay | Admitting: Orthopaedic Surgery

## 2021-01-10 ENCOUNTER — Other Ambulatory Visit: Payer: Self-pay | Admitting: Family Medicine

## 2021-01-16 ENCOUNTER — Ambulatory Visit: Payer: Medicare HMO | Admitting: Orthopedic Surgery

## 2021-01-21 ENCOUNTER — Ambulatory Visit: Payer: Medicare HMO | Admitting: Internal Medicine

## 2021-01-29 ENCOUNTER — Encounter: Payer: Self-pay | Admitting: Internal Medicine

## 2021-01-29 ENCOUNTER — Other Ambulatory Visit: Payer: Self-pay

## 2021-01-29 ENCOUNTER — Ambulatory Visit (INDEPENDENT_AMBULATORY_CARE_PROVIDER_SITE_OTHER): Payer: Medicare HMO | Admitting: Internal Medicine

## 2021-01-29 VITALS — BP 154/74 | HR 76 | Resp 18 | Ht 65.0 in | Wt 195.8 lb

## 2021-01-29 DIAGNOSIS — E038 Other specified hypothyroidism: Secondary | ICD-10-CM | POA: Diagnosis not present

## 2021-01-29 DIAGNOSIS — M1990 Unspecified osteoarthritis, unspecified site: Secondary | ICD-10-CM | POA: Insufficient documentation

## 2021-01-29 DIAGNOSIS — I10 Essential (primary) hypertension: Secondary | ICD-10-CM

## 2021-01-29 DIAGNOSIS — E1121 Type 2 diabetes mellitus with diabetic nephropathy: Secondary | ICD-10-CM

## 2021-01-29 MED ORDER — METOPROLOL SUCCINATE ER 50 MG PO TB24: 50.0000 mg | ORAL_TABLET | Freq: Every day | ORAL | 0 refills | Status: DC

## 2021-01-29 NOTE — Assessment & Plan Note (Signed)
BP Readings from Last 1 Encounters:  01/29/21 (!) 154/74   Uncontrolled with losartan, metoprolol and Imdur Increased dose of metoprolol to 50 mg daily Counseled for compliance with the medications Advised DASH diet and moderate exercise/walking as tolerated

## 2021-01-29 NOTE — Patient Instructions (Signed)
Please start taking Metoprolol 50 mg instead of 25 mg once daily.  Continue taking other medications as prescribed.  Please discuss about hip pain with your Orthopedic surgeon.

## 2021-01-29 NOTE — Assessment & Plan Note (Signed)
Lab Results  Component Value Date   HGBA1C 7.1 (H) 10/04/2020   Follows diabetic diet Check HbA1c On ARB and Crestor

## 2021-01-29 NOTE — Assessment & Plan Note (Signed)
On chronic pain management Advised to discuss with orthopedic surgeon for possible surgical treatment of hip OA

## 2021-01-29 NOTE — Progress Notes (Signed)
Established Patient Office Visit  Subjective:  Patient ID: Tammy Suarez, female    DOB: 1946/08/24  Age: 75 y.o. MRN: [id number]  CC:  Chief Complaint  Patient presents with  . Follow-up    Follow up bp wants to know if you can draw blood work also fell on bottom and now when she gets up she is having pain in her sides     HPI Tammy Suarez presents for follow-up of her chronic medical conditions.  HTN: Her BP was elevated in the office today.  She has been taking losartan 100 mg daily, Toprol-XL 25 mg daily and Imdur 30 mg QD. She denies any headache, dizziness, chest pain, dyspnea or palpitations.  DM: Her HbA1C she was 7.1.  She is not on any diabetic medication currently.  She reports that she has been losing weight and trying to watch over her diet.  She is on Crestor for HLD.  She reports a fall due to stumbling upon a wooden piece at her home and fell over her right hip.  Her right hip pain has been improving since then, she denies any swelling or redness over the right hip area.  She has chronic bilateral hip pain, which is worse when she tries to get up from the sitting position.  Past Medical History:  Diagnosis Date  . Annual physical exam 05/31/2016  . Arthritis   . Carpal tunnel syndrome    bilaterally  . Chronic back pain   . CKD (chronic kidney disease) stage 3, GFR 30-59 ml/min (HCC)   . Coronary atherosclerosis of native coronary artery    Multivessel status post CABG - LIMA to LAD, SVG to OM and SVG to PDA  . Depression   . Economic stress 04/25/2019  . Essential hypertension   . GERD (gastroesophageal reflux disease)   . Headache   . History of conjunctivitis   . History of hiatal hernia   . Hyperlipidemia   . Hypothyroidism   . Myocardial infarction ([place]) 1999  . Neck pain 01/11/2017  . Obesity   . Shoulder pain, left 04/22/2017  . Type 2 diabetes mellitus ([place])   . Uterine cancer ([place])   . Vitamin B 12 deficiency     Past Surgical History:   Procedure Laterality Date  . ABDOMINAL HYSTERECTOMY    . BACK SURGERY    . CARDIAC SURGERY    . CARPAL TUNNEL RELEASE Left 07/23/2016   Procedure: CARPAL TUNNEL RELEASE;  Surgeon: Carole Civil, MD;  Location: [facility];  Service: Orthopedics;  Laterality: Left;  . CERVICAL LAMINECTOMY    . CHOLECYSTECTOMY    . COLONOSCOPY  2007   Dr. Irving Shows: reported to be normal  . COLONOSCOPY WITH PROPOFOL N/A 04/29/2020   Procedure: COLONOSCOPY WITH PROPOFOL;  Surgeon: Daneil Dolin, MD;  Location: [facility];  Service: Endoscopy;  Laterality: N/A;  12:45pm  . CORONARY ARTERY BYPASS GRAFT N/A 11/02/2013   Procedure: CORONARY ARTERY BYPASS GRAFTING (CABG);  Surgeon: Gaye Pollack, MD;  Location: [facility];  Service: Open Heart Surgery;  Laterality: N/A;  CABG x three, using left internal mammary artery and right leg greater saphenous vein harvested endoscopically  . COSMETIC SURGERY  07/01/2012   Recurrent skin infection on lower abdomen, [facility]  . HERNIA REPAIR    . Inguinal herniorrhaphy    . INTRAOPERATIVE TRANSESOPHAGEAL ECHOCARDIOGRAM N/A 11/02/2013   Procedure: INTRAOPERATIVE TRANSESOPHAGEAL ECHOCARDIOGRAM;  Surgeon: Gaye Pollack, MD;  Location: [facility];  Service: Open Heart Surgery;  Laterality: N/A;  . LAPAROSCOPIC GASTRIC BANDING  2009  . LEFT HEART CATHETERIZATION WITH CORONARY ANGIOGRAM N/A 10/30/2013   Procedure: LEFT HEART CATHETERIZATION WITH CORONARY ANGIOGRAM;  Surgeon: Minus Breeding, MD;  Location: [facility];  Service: Cardiovascular;  Laterality: N/A;  . Left index finger repair    . LUMBAR FUSION    . POLYPECTOMY  04/29/2020   Procedure: POLYPECTOMY;  Surgeon: Daneil Dolin, MD;  Location: [facility];  Service: Endoscopy;;  . SPINE SURGERY    . VESICOVAGINAL FISTULA CLOSURE W/ TAH      Family History  Problem Relation Age of Onset  . Heart attack Mother   . Heart attack Father   . Hypertension Sister   . Heart attack Sister   . Cancer Brother        Renal  cell   . Hepatitis Daughter   . Drug abuse Daughter   . Hypertension Daughter   . Colon cancer Neg Hx     Social History   Socioeconomic History  . Marital status: Single    Spouse name: Not on file  . Number of children: 3  . Years of education: Not on file  . Highest education level: GED or equivalent  Occupational History  . Not on file  Tobacco Use  . Smoking status: Current Every Day Smoker    Packs/day: 0.25    Years: 50.00    Pack years: 12.50    Types: Cigarettes  . Smokeless tobacco: Never Used  . Tobacco comment: smokes about 10 a day   Vaping Use  . Vaping Use: Never used  Substance and Sexual Activity  . Alcohol use: No    Alcohol/week: 0.0 standard drinks  . Drug use: No  . Sexual activity: Never    Birth control/protection: Surgical  Other Topics Concern  . Not on file  Social History Narrative  . Not on file   Social Determinants of Health   Financial Resource Strain: Low Risk   . Difficulty of Paying Living Expenses: Not hard at all  Food Insecurity: No Food Insecurity  . Worried About Charity fundraiser in the Last Year: Never true  . Ran Out of Food in the Last Year: Never true  Transportation Needs: No Transportation Needs  . Lack of Transportation (Medical): No  . Lack of Transportation (Non-Medical): No  Physical Activity: Insufficiently Active  . Days of Exercise per Week: 3 days  . Minutes of Exercise per Session: 40 min  Stress: No Stress Concern Present  . Feeling of Stress : Only a little  Social Connections: Socially Isolated  . Frequency of Communication with Friends and Family: Three times a week  . Frequency of Social Gatherings with Friends and Family: Three times a week  . Attends Religious Services: Never  . Active Member of Clubs or Organizations: No  . Attends Archivist Meetings: Never  . Marital Status: Widowed  Intimate Partner Violence: Not on file    Outpatient Medications Prior to Visit  Medication Sig  Dispense Refill  . alendronate (FOSAMAX) 70 MG tablet Take 1 tablet (70 mg total) by mouth every 7 (seven) days. Take with a full glass of water on an empty stomach. 4 tablet 11  . busPIRone (BUSPAR) 10 MG tablet Take 1 tablet (10 mg total) by mouth 3 (three) times daily. 90 tablet 2  . DULoxetine (CYMBALTA) 60 MG capsule TAKE ONE CAPSULE BY MOUTH TWICE DAILY 180 capsule  0  . ezetimibe (ZETIA) 10 MG tablet TAKE 1 TABLET(10 MG) BY MOUTH DAILY 90 tablet 1  . famotidine (PEPCID) 20 MG tablet TAKE 1 TABLET(20 MG) BY MOUTH TWICE DAILY 180 tablet 1  . fenofibrate (TRICOR) 145 MG tablet Take 1 tablet (145 mg total) by mouth daily. 90 tablet 1  . isosorbide mononitrate (IMDUR) 30 MG 24 hr tablet TAKE 1 TABLET(30 MG) BY MOUTH AT BEDTIME 30 tablet 11  . levothyroxine (SYNTHROID) 100 MCG tablet TAKE 1 TABLET(100 MCG) BY MOUTH DAILY 90 tablet 3  . losartan (COZAAR) 100 MG tablet Take 1 tablet (100 mg total) by mouth daily. 90 tablet 1  . oxybutynin (DITROPAN-XL) 5 MG 24 hr tablet Take by mouth.    . rosuvastatin (CRESTOR) 40 MG tablet TAKE 1 TABLET(40 MG) BY MOUTH DAILY 90 tablet 2  . losartan (COZAAR) 100 MG tablet Take 1 tablet (100 mg total) by mouth daily. 30 tablet 2  . metoprolol succinate (TOPROL-XL) 25 MG 24 hr tablet TAKE 1 TABLET BY MOUTH EVERY DAY 90 tablet 0   No facility-administered medications prior to visit.    Allergies  Allergen Reactions  . Gabapentin Other (See Comments)    Feels dizzy  . Meperidine Hcl Nausea And Vomiting  . Niacin Other (See Comments)    REACTION: face peeling and burning  . Propoxyphene N-Acetaminophen Nausea And Vomiting  . Hydrocodone-Acetaminophen Nausea And Vomiting    ROS Review of Systems  Constitutional: Negative for chills and fever.  HENT: Negative for congestion, sinus pressure, sinus pain and sore throat.   Eyes: Negative for pain and discharge.  Respiratory: Negative for cough and shortness of breath.   Cardiovascular: Negative for chest pain  and palpitations.  Gastrointestinal: Negative for abdominal pain, constipation, diarrhea, nausea and vomiting.  Endocrine: Negative for polydipsia and polyuria.  Genitourinary: Negative for dysuria and hematuria.  Musculoskeletal: Positive for arthralgias and gait problem. Negative for neck pain and neck stiffness.  Skin: Negative for rash.  Neurological: Negative for dizziness and weakness.  Psychiatric/Behavioral: Negative for agitation and behavioral problems.      Objective:    Physical Exam Vitals reviewed.  Constitutional:      General: She is not in acute distress.    Appearance: She is not diaphoretic.  HENT:     Head: Normocephalic and atraumatic.     Nose: Nose normal. No congestion.     Mouth/Throat:     Mouth: Mucous membranes are moist.     Pharynx: No posterior oropharyngeal erythema.  Eyes:     General: No scleral icterus.    Extraocular Movements: Extraocular movements intact.  Cardiovascular:     Rate and Rhythm: Normal rate and regular rhythm.     Heart sounds: No murmur heard.   Pulmonary:     Breath sounds: Normal breath sounds. No wheezing or rales.  Musculoskeletal:        General: Tenderness (Right hip area) present.     Cervical back: Neck supple. No tenderness.     Right lower leg: No edema.     Left lower leg: No edema.  Skin:    General: Skin is warm.     Findings: No rash.  Neurological:     General: No focal deficit present.     Mental Status: She is alert and oriented to person, place, and time.  Psychiatric:        Mood and Affect: Mood normal.        Behavior: Behavior normal.  BP (!) 154/74 (BP Location: Right Arm, Patient Position: Sitting, Cuff Size: Normal)   Pulse 76   Resp 18   Ht 5\' 5"  (1.651 m)   Wt 195 lb 12.8 oz (88.8 kg)   SpO2 98%   BMI 32.58 kg/m  Wt Readings from Last 3 Encounters:  01/29/21 195 lb 12.8 oz (88.8 kg)  12/10/20 197 lb (89.4 kg)  10/21/20 193 lb (87.5 kg)     Health Maintenance Due  Topic  Date Due  . TETANUS/TDAP  06/13/2020  . COVID-19 Vaccine (3 - Pfizer risk 4-dose series) 08/19/2020  . FOOT EXAM  08/25/2020  . OPHTHALMOLOGY EXAM  01/22/2021    There are no preventive care reminders to display for this patient.  Lab Results  Component Value Date   TSH 1.790 10/04/2020   Lab Results  Component Value Date   WBC 5.0 10/05/2019   HGB 12.0 10/05/2019   HCT 37.1 10/05/2019   MCV 101.6 (H) 10/05/2019   PLT 155 10/05/2019   Lab Results  Component Value Date   NA 139 10/04/2020   K 5.1 10/04/2020   CO2 24 10/04/2020   GLUCOSE 135 (H) 10/04/2020   BUN 30 (H) 10/04/2020   CREATININE 1.65 (H) 10/04/2020   BILITOT 0.3 10/04/2020   ALKPHOS 70 10/04/2020   AST 30 10/04/2020   ALT 11 10/04/2020   PROT 6.5 10/04/2020   ALBUMIN 3.8 10/04/2020   CALCIUM 10.5 (H) 10/04/2020   ANIONGAP 8 04/25/2020   Lab Results  Component Value Date   CHOL 81 (L) 10/04/2020   Lab Results  Component Value Date   HDL 16 (L) 10/04/2020   Lab Results  Component Value Date   LDLCALC 34 10/04/2020   Lab Results  Component Value Date   TRIG 185 (H) 10/04/2020   Lab Results  Component Value Date   CHOLHDL 5.1 (H) 10/04/2020   Lab Results  Component Value Date   HGBA1C 7.1 (H) 10/04/2020      Assessment & Plan:   Problem List Items Addressed This Visit      Cardiovascular and Mediastinum   Essential hypertension, benign - Primary    BP Readings from Last 1 Encounters:  01/29/21 (!) 154/74   Uncontrolled with losartan, metoprolol and Imdur Increased dose of metoprolol to 50 mg daily Counseled for compliance with the medications Advised DASH diet and moderate exercise/walking as tolerated      Relevant Medications   metoprolol succinate (TOPROL-XL) 50 MG 24 hr tablet   Other Relevant Orders   CBC     Endocrine   Hypothyroidism    On levothyroxine 100 mcg QD Check TSH and free T4      Relevant Medications   metoprolol succinate (TOPROL-XL) 50 MG 24 hr  tablet   Other Relevant Orders   TSH + free T4   DM (diabetes mellitus), type 2 with renal complications (HCC)    Lab Results  Component Value Date   HGBA1C 7.1 (H) 10/04/2020   Follows diabetic diet Check HbA1c On ARB and Crestor      Relevant Orders   HgB A1c   Lipid panel     Musculoskeletal and Integument   Osteoarthritis    On chronic pain management Advised to discuss with orthopedic surgeon for possible surgical treatment of hip OA         Meds ordered this encounter  Medications  . metoprolol succinate (TOPROL-XL) 50 MG 24 hr tablet    Sig: Take 1 tablet (  50 mg total) by mouth daily. Take with or immediately following a meal.    Dispense:  90 tablet    Refill:  0    Follow-up: Return in about 4 months (around 05/31/2021) for HTN and DM.    Lindell Spar, MD

## 2021-01-29 NOTE — Assessment & Plan Note (Signed)
On levothyroxine 100 mcg QD Check TSH and free T4

## 2021-01-30 ENCOUNTER — Other Ambulatory Visit: Payer: Self-pay | Admitting: Internal Medicine

## 2021-01-30 DIAGNOSIS — E1121 Type 2 diabetes mellitus with diabetic nephropathy: Secondary | ICD-10-CM

## 2021-01-30 LAB — CBC
Hematocrit: 35.7 % (ref 34.0–46.6)
Hemoglobin: 11.9 g/dL (ref 11.1–15.9)
MCH: 32.2 pg (ref 26.6–33.0)
MCHC: 33.3 g/dL (ref 31.5–35.7)
MCV: 97 fL (ref 79–97)
Platelets: 252 10*3/uL (ref 150–450)
RBC: 3.69 x10E6/uL — ABNORMAL LOW (ref 3.77–5.28)
RDW: 11.8 % (ref 11.7–15.4)
WBC: 5.6 10*3/uL (ref 3.4–10.8)

## 2021-01-30 LAB — HEMOGLOBIN A1C
Est. average glucose Bld gHb Est-mCnc: 186 mg/dL
Hgb A1c MFr Bld: 8.1 % — ABNORMAL HIGH (ref 4.8–5.6)

## 2021-01-30 LAB — LIPID PANEL
Chol/HDL Ratio: 5.1 ratio — ABNORMAL HIGH (ref 0.0–4.4)
Cholesterol, Total: 77 mg/dL — ABNORMAL LOW (ref 100–199)
HDL: 15 mg/dL — ABNORMAL LOW (ref >39)
LDL Chol Calc (NIH): 37 mg/dL (ref 0–99)
Triglycerides: 145 mg/dL (ref 0–149)
VLDL Cholesterol Cal: 25 mg/dL (ref 5–40)

## 2021-01-30 LAB — TSH+FREE T4
Free T4: 1.54 ng/dL (ref 0.82–1.77)
TSH: 0.86 u[IU]/mL (ref 0.450–4.500)

## 2021-01-30 MED ORDER — GLIPIZIDE 5 MG PO TABS
5.0000 mg | ORAL_TABLET | Freq: Two times a day (BID) | ORAL | 3 refills | Status: DC
Start: 2021-01-30 — End: 2021-03-25

## 2021-02-03 ENCOUNTER — Other Ambulatory Visit: Payer: Self-pay

## 2021-02-03 ENCOUNTER — Ambulatory Visit: Payer: Medicare HMO | Admitting: Orthopedic Surgery

## 2021-02-03 ENCOUNTER — Encounter: Payer: Self-pay | Admitting: Orthopedic Surgery

## 2021-02-03 VITALS — BP 157/81 | HR 67 | Ht 65.0 in | Wt 196.0 lb

## 2021-02-03 DIAGNOSIS — M4712 Other spondylosis with myelopathy, cervical region: Secondary | ICD-10-CM | POA: Diagnosis not present

## 2021-02-03 DIAGNOSIS — M542 Cervicalgia: Secondary | ICD-10-CM

## 2021-02-03 DIAGNOSIS — M79601 Pain in right arm: Secondary | ICD-10-CM | POA: Diagnosis not present

## 2021-02-03 NOTE — Patient Instructions (Signed)
You have a pinched nerve in your neck   We are referring you to a neck specialist

## 2021-02-03 NOTE — Progress Notes (Signed)
Chief Complaint  Patient presents with  . Arm Pain    Right arm pain, Patient reports worse in last few weeks.     75 year old female with hypertension diabetes with worsening pain in the right upper extremity had MRI cervical spine.  Prior history noted below  Gabapentin makes her dizzy  HPI: 75 year old female had a right proximal humerus fracture treated with closed means   She developed stiffness in the right shoulder which affected her activities of daily living however   She continued to have pain in the right arm radiating down to the right hand associated with numbness and tingling mainly of the index long ring and small finger with occasional numbness and tingling in the left upper extremity   She reports clumsiness with small objects she is dropping things and cannot hold on and she is also clumsy when she walks   She is status post posterior cervical surgery 15 to 20 years ago  C2-3: Small disc osteophyte complex with uncovertebral and facet hypertrophy. Mild spinal canal narrowing. Patent neural foramen.   C3-4: Disc osteophyte complex with superimposed central protrusion abutting the ventral cord. Uncovertebral and facet hypertrophy. Mild spinal canal and bilateral neural foraminal narrowing.   C4-5: Disc osteophyte complex with shallow right foraminal protrusion. Uncovertebral and facet hypertrophy. Patent spinal canal. Mild right greater than left neural foraminal narrowing.   C5-6: Disc osteophyte complex with superimposed shallow right paracentral protrusion. Uncovertebral and facet degenerative spurring. Patent spinal canal and neural foramen.   C6-7: Right predominant disc osteophyte complex with superimposed right foraminal protrusion. Uncovertebral and facet hypertrophy. Mild spinal canal, moderate right and mild left neural foraminal narrowing.   C7-T1: Uncovertebral and facet degenerative spurring. Sequela of left laminotomy. Patent spinal canal and  left neural foramen. Mild right neural foraminal narrowing.   Paraspinal tissues: Postprocedural appearance of the paraspinal soft tissues.   IMPRESSION: Multilevel spondylosis. Mild spinal canal narrowing at the C2-4 and C6-7 levels.   Moderate right C6-7 neural foraminal narrowing. Mild bilateral C3-4, C4-5, left C6-7 and right C7-T1 neural foraminal narrowing.     Electronically Signed   By: Primitivo Gauze M.D.   On: 11/01/2020 14:25

## 2021-02-03 NOTE — Addendum Note (Signed)
Addended by: Dessie Coma on: 02/03/2021 10:25 AM   Modules accepted: Orders

## 2021-02-05 ENCOUNTER — Ambulatory Visit: Payer: Medicare HMO | Admitting: Family Medicine

## 2021-02-09 ENCOUNTER — Other Ambulatory Visit: Payer: Self-pay | Admitting: Family Medicine

## 2021-02-13 ENCOUNTER — Telehealth: Payer: Self-pay

## 2021-02-13 NOTE — Telephone Encounter (Signed)
Patient returning call. [phone]

## 2021-02-13 NOTE — Telephone Encounter (Signed)
Will return call. See lab result note.

## 2021-02-17 ENCOUNTER — Telehealth: Payer: Self-pay

## 2021-02-17 NOTE — Telephone Encounter (Signed)
I have attempted to contact pt multiple times regarding lab results will mail letter

## 2021-02-17 NOTE — Telephone Encounter (Signed)
Please contact patient regarding lab results. [phone]

## 2021-02-20 ENCOUNTER — Other Ambulatory Visit: Payer: Self-pay | Admitting: Internal Medicine

## 2021-02-20 ENCOUNTER — Telehealth: Payer: Self-pay

## 2021-02-20 DIAGNOSIS — R5381 Other malaise: Secondary | ICD-10-CM

## 2021-02-20 DIAGNOSIS — R296 Repeated falls: Secondary | ICD-10-CM

## 2021-02-20 NOTE — Telephone Encounter (Signed)
Tammy Suarez w/ Dr. Toya Smothers office called stating that she was seen today and pt mentioned having a lot of falls. Dr. Theador Hawthorne suggested you ordering PT for pt. They are faxing her notes from visit today over now. Please advise.

## 2021-02-20 NOTE — Telephone Encounter (Signed)
PT order placed.

## 2021-02-26 ENCOUNTER — Ambulatory Visit (HOSPITAL_COMMUNITY): Payer: Medicare HMO | Attending: Internal Medicine | Admitting: Physical Therapy

## 2021-03-01 ENCOUNTER — Other Ambulatory Visit: Payer: Self-pay | Admitting: Family Medicine

## 2021-03-05 ENCOUNTER — Telehealth: Payer: Self-pay | Admitting: Family Medicine

## 2021-03-05 NOTE — Telephone Encounter (Signed)
PLS SCHEDULE OV IN THE NEXT 1 WEEK, LET HER KNOW I HAVE A MESSAGE FROM [facility], REPORTING RECURRENT FALLS, NEED TO F/U ON THIS ,SHE MAY NED ASSISTANCE IN HOME , I AM CONCERNED

## 2021-03-10 ENCOUNTER — Other Ambulatory Visit: Payer: Self-pay | Admitting: Family Medicine

## 2021-03-10 DIAGNOSIS — F418 Other specified anxiety disorders: Secondary | ICD-10-CM

## 2021-03-10 NOTE — Telephone Encounter (Signed)
LVM for the pt to call and schedule and appt

## 2021-03-11 ENCOUNTER — Other Ambulatory Visit: Payer: Self-pay | Admitting: Family Medicine

## 2021-03-12 ENCOUNTER — Other Ambulatory Visit: Payer: Self-pay

## 2021-03-12 ENCOUNTER — Encounter: Payer: Self-pay | Admitting: Cardiology

## 2021-03-12 ENCOUNTER — Ambulatory Visit (INDEPENDENT_AMBULATORY_CARE_PROVIDER_SITE_OTHER): Payer: Medicare HMO | Admitting: Cardiology

## 2021-03-12 VITALS — BP 140/70 | HR 60 | Ht 65.0 in | Wt 194.0 lb

## 2021-03-12 DIAGNOSIS — I25119 Atherosclerotic heart disease of native coronary artery with unspecified angina pectoris: Secondary | ICD-10-CM

## 2021-03-12 DIAGNOSIS — E782 Mixed hyperlipidemia: Secondary | ICD-10-CM

## 2021-03-12 DIAGNOSIS — I10 Essential (primary) hypertension: Secondary | ICD-10-CM | POA: Diagnosis not present

## 2021-03-12 DIAGNOSIS — N1832 Chronic kidney disease, stage 3b: Secondary | ICD-10-CM

## 2021-03-12 NOTE — Patient Instructions (Signed)
Medication Instructions:  Your physician recommends that you continue on your current medications as directed. Please refer to the Current Medication list given to you today.  *If you need a refill on your cardiac medications before your next appointment, please call your pharmacy*   Lab Work: None If you have labs (blood work) drawn today and your tests are completely normal, you will receive your results only by: MyChart Message (if you have MyChart) OR A paper copy in the mail If you have any lab test that is abnormal or we need to change your treatment, we will call you to review the results.   Testing/Procedures: None   Follow-Up: At [facility], you and your health needs are our priority.  As part of our continuing mission to provide you with exceptional heart care, we have created designated Provider Care Teams.  These Care Teams include your primary Cardiologist (physician) and Advanced Practice Providers (APPs -  Physician Assistants and Nurse Practitioners) who all work together to provide you with the care you need, when you need it.  We recommend signing up for the patient portal called "MyChart".  Sign up information is provided on this After Visit Summary.  MyChart is used to connect with patients for Virtual Visits (Telemedicine).  Patients are able to view lab/test results, encounter notes, upcoming appointments, etc.  Non-urgent messages can be sent to your provider as well.   To learn more about what you can do with MyChart, go to [email or web address].    Your next appointment:   12 month(s)  The format for your next appointment:   In Person  Provider:   Samuel McDowell, MD   Other Instructions    

## 2021-03-12 NOTE — Progress Notes (Signed)
Cardiology Office Note  Date: 03/12/2021   ID: Tammy Suarez, DOB 07-25-1946, MRN [id number]  PCP:  Fayrene Helper, MD  Cardiologist:  Rozann Lesches, MD Electrophysiologist:  None   Chief Complaint  Patient presents with  . Cardiac follow-up    History of Present Illness: OLIVETTE Suarez is a 75 y.o. female last seen in February 2021.  She is here for a follow-up visit.  She does not report any angina symptoms or nitroglycerin use.  Functional with ADLs but with chronic fatigue.  She continues to follow regularly with PCP.  She is following with Dr. Theador Hawthorne, I reviewed the note from early April.  Recent creatinine 1.84.  I reviewed her medications which are outlined below and stable from a cardiac perspective.  Her most recent LDL was well controlled at 37.  I personally reviewed her ECG today which shows normal sinus rhythm.  Past Medical History:  Diagnosis Date  . Arthritis   . Carpal tunnel syndrome   . Chronic back pain   . CKD (chronic kidney disease) stage 3, GFR 30-59 ml/min (HCC)   . Coronary atherosclerosis of native coronary artery    Multivessel status post CABG - LIMA to LAD, SVG to OM and SVG to PDA  . Depression   . Economic stress   . Essential hypertension   . GERD (gastroesophageal reflux disease)   . Headache   . History of conjunctivitis   . History of hiatal hernia   . Hyperlipidemia   . Hypothyroidism   . Myocardial infarction ([place]) 1999  . Obesity   . Type 2 diabetes mellitus ([place])   . Uterine cancer ([place])   . Vitamin B 12 deficiency     Past Surgical History:  Procedure Laterality Date  . ABDOMINAL HYSTERECTOMY    . BACK SURGERY    . CARDIAC SURGERY    . CARPAL TUNNEL RELEASE Left 07/23/2016   Procedure: CARPAL TUNNEL RELEASE;  Surgeon: Carole Civil, MD;  Location: [facility];  Service: Orthopedics;  Laterality: Left;  . CERVICAL LAMINECTOMY    . CHOLECYSTECTOMY    . COLONOSCOPY  2007   Dr. Irving Shows: reported to be  normal  . COLONOSCOPY WITH PROPOFOL N/A 04/29/2020   Procedure: COLONOSCOPY WITH PROPOFOL;  Surgeon: Daneil Dolin, MD;  Location: [facility];  Service: Endoscopy;  Laterality: N/A;  12:45pm  . CORONARY ARTERY BYPASS GRAFT N/A 11/02/2013   Procedure: CORONARY ARTERY BYPASS GRAFTING (CABG);  Surgeon: Gaye Pollack, MD;  Location: [place];  Service: Open Heart Surgery;  Laterality: N/A;  CABG x three, using left internal mammary artery and right leg greater saphenous vein harvested endoscopically  . COSMETIC SURGERY  07/01/2012   Recurrent skin infection on lower abdomen, [facility]  . HERNIA REPAIR    . Inguinal herniorrhaphy    . INTRAOPERATIVE TRANSESOPHAGEAL ECHOCARDIOGRAM N/A 11/02/2013   Procedure: INTRAOPERATIVE TRANSESOPHAGEAL ECHOCARDIOGRAM;  Surgeon: Gaye Pollack, MD;  Location: [facility] OR;  Service: Open Heart Surgery;  Laterality: N/A;  . LAPAROSCOPIC GASTRIC BANDING  2009  . LEFT HEART CATHETERIZATION WITH CORONARY ANGIOGRAM N/A 10/30/2013   Procedure: LEFT HEART CATHETERIZATION WITH CORONARY ANGIOGRAM;  Surgeon: Minus Breeding, MD;  Location: [facility] CATH LAB;  Service: Cardiovascular;  Laterality: N/A;  . Left index finger repair    . LUMBAR FUSION    . POLYPECTOMY  04/29/2020   Procedure: POLYPECTOMY;  Surgeon: Daneil Dolin, MD;  [facility];  Service: Endoscopy;;  .  SPINE SURGERY    . VESICOVAGINAL FISTULA CLOSURE W/ TAH      Current Outpatient Medications  Medication Sig Dispense Refill  . alendronate (FOSAMAX) 70 MG tablet Take 1 tablet (70 mg total) by mouth every 7 (seven) days. Take with a full glass of water on an empty stomach. 4 tablet 11  . busPIRone (BUSPAR) 10 MG tablet TAKE 1 TABLET(10 MG) BY MOUTH THREE TIMES DAILY 90 tablet 2  . DULoxetine (CYMBALTA) 60 MG capsule TAKE 1 CAPSULE BY MOUTH TWICE DAILY 180 capsule 0  . ezetimibe (ZETIA) 10 MG tablet TAKE 1 TABLET(10 MG) BY MOUTH DAILY 90 tablet 1  . famotidine (PEPCID) 20 MG tablet TAKE 1 TABLET(20 MG) BY  MOUTH TWICE DAILY 180 tablet 1  . fenofibrate (TRICOR) 145 MG tablet Take 1 tablet (145 mg total) by mouth daily. 90 tablet 1  . isosorbide mononitrate (IMDUR) 30 MG 24 hr tablet TAKE 1 TABLET(30 MG) BY MOUTH AT BEDTIME 30 tablet 11  . levothyroxine (SYNTHROID) 100 MCG tablet TAKE 1 TABLET(100 MCG) BY MOUTH DAILY 90 tablet 3  . losartan (COZAAR) 100 MG tablet Take 1 tablet (100 mg total) by mouth daily. 90 tablet 1  . metoprolol succinate (TOPROL-XL) 50 MG 24 hr tablet Take 1 tablet (50 mg total) by mouth daily. Take with or immediately following a meal. 90 tablet 0  . oxybutynin (DITROPAN-XL) 5 MG 24 hr tablet Take by mouth.    . rosuvastatin (CRESTOR) 40 MG tablet TAKE 1 TABLET(40 MG) BY MOUTH DAILY 90 tablet 2  . glipiZIDE (GLUCOTROL) 5 MG tablet Take 1 tablet (5 mg total) by mouth 2 (two) times daily before a meal. (Patient not taking: Reported on 03/12/2021) 60 tablet 3   No current facility-administered medications for this visit.   Allergies:  Gabapentin, Meperidine hcl, Niacin, Propoxyphene n-acetaminophen, and Hydrocodone-acetaminophen   ROS: No palpitations or syncope.  Physical Exam: VS:  BP 140/70   Pulse 60   Ht 5\' 5"  (1.651 m)   Wt 194 lb (88 kg)   SpO2 93%   BMI 32.28 kg/m , BMI Body mass index is 32.28 kg/m.  Wt Readings from Last 3 Encounters:  03/12/21 194 lb (88 kg)  02/03/21 196 lb (88.9 kg)  01/29/21 195 lb 12.8 oz (88.8 kg)    General: Patient appears comfortable at rest. HEENT: Conjunctiva and lids normal, wearing a mask. Neck: Supple, no elevated JVP or carotid bruits, no thyromegaly. Lungs: Clear to auscultation, nonlabored breathing at rest. Cardiac: Regular rate and rhythm, no S3, soft systolic murmur, no pericardial rub. Extremities: No pitting edema.  ECG:  An ECG dated 12/28/2019 was personally reviewed today and demonstrated:  Sinus bradycardia with old anterior infarct pattern, ST-T wave abnormalities.  Recent Labwork: 10/04/2020: ALT 11; AST 30;  BUN 30; Creatinine, Ser 1.65; Potassium 5.1; Sodium 139 01/29/2021: Hemoglobin 11.9; Platelets 252; TSH 0.860     Component Value Date/Time   CHOL 77 (L) 01/29/2021 1430   TRIG 145 01/29/2021 1430   HDL 15 (L) 01/29/2021 1430   CHOLHDL 5.1 (H) 01/29/2021 1430   CHOLHDL 5.2 (H) 12/25/2019 0859   VLDL 37 (H) 04/22/2017 0952   LDLCALC 37 01/29/2021 1430   LDLCALC 51 12/25/2019 0859    Other Studies Reviewed Today:  Echocardiogram 11/01/2013: Study Conclusions   Left ventricle: The cavity size was normal. Wall thickness  was normal. Systolic function was normal. The estimated  ejection fraction was in the range of 55% to 60%. Wall  motion  was normal; there were no regional wall motion  abnormalities. There was an increased relative contribution  of atrial contraction to ventricular filling.  Lexiscan Myoview 08/30/2018:  There was no ST segment deviation noted during stress.  The study is normal. No myocardial ischemia or scar.  This is a low risk study.  Nuclear stress EF: 72%.  Assessment and Plan:  1.  Multivessel CAD status post CABG in 2014.  She does not describe any active angina symptoms or nitroglycerin use.  Myoview from 2019 was low risk.  Continue observation on medical therapy.  Currently on Toprol-XL, Cozaar, Imdur, Zetia, and Crestor.  2.  Hyperlipidemia, tolerating Zetia and Crestor with LDL 37.  3.  Essential hypertension, systolic 263 today.  No change in current regimen including Cozaar and Toprol-XL.  4.  CKD stage IIIb, followed by Dr. Theador Hawthorne.  Recent creatinine 1.84.  Medication Adjustments/Labs and Tests Ordered: Current medicines are reviewed at length with the patient today.  Concerns regarding medicines are outlined above.   Tests Ordered: Orders Placed This Encounter  Procedures  . EKG 12-Lead    Medication Changes: No orders of the defined types were placed in this encounter.   Disposition:  Follow up 1 year.  Signed, Satira Sark, MD, [facility] 03/12/2021 11:33 AM    [place] [facility] [address] [place], [place] [address] Phone: [phone]; Fax: [phone]

## 2021-03-13 ENCOUNTER — Other Ambulatory Visit: Payer: Self-pay | Admitting: Internal Medicine

## 2021-03-13 DIAGNOSIS — I10 Essential (primary) hypertension: Secondary | ICD-10-CM

## 2021-03-21 ENCOUNTER — Other Ambulatory Visit: Payer: Self-pay | Admitting: Family Medicine

## 2021-03-25 ENCOUNTER — Encounter: Payer: Self-pay | Admitting: Family Medicine

## 2021-03-25 ENCOUNTER — Other Ambulatory Visit: Payer: Self-pay

## 2021-03-25 ENCOUNTER — Ambulatory Visit (INDEPENDENT_AMBULATORY_CARE_PROVIDER_SITE_OTHER): Payer: Medicare HMO | Admitting: Family Medicine

## 2021-03-25 VITALS — BP 128/80 | HR 66 | Resp 15 | Ht 65.0 in | Wt 198.8 lb

## 2021-03-25 DIAGNOSIS — I10 Essential (primary) hypertension: Secondary | ICD-10-CM | POA: Diagnosis not present

## 2021-03-25 DIAGNOSIS — E038 Other specified hypothyroidism: Secondary | ICD-10-CM | POA: Diagnosis not present

## 2021-03-25 DIAGNOSIS — N39498 Other specified urinary incontinence: Secondary | ICD-10-CM

## 2021-03-25 DIAGNOSIS — E1121 Type 2 diabetes mellitus with diabetic nephropathy: Secondary | ICD-10-CM | POA: Diagnosis not present

## 2021-03-25 DIAGNOSIS — F17218 Nicotine dependence, cigarettes, with other nicotine-induced disorders: Secondary | ICD-10-CM

## 2021-03-25 DIAGNOSIS — K219 Gastro-esophageal reflux disease without esophagitis: Secondary | ICD-10-CM

## 2021-03-25 DIAGNOSIS — E66811 Obesity, class 1: Secondary | ICD-10-CM

## 2021-03-25 DIAGNOSIS — F17210 Nicotine dependence, cigarettes, uncomplicated: Secondary | ICD-10-CM | POA: Diagnosis not present

## 2021-03-25 DIAGNOSIS — E785 Hyperlipidemia, unspecified: Secondary | ICD-10-CM

## 2021-03-25 DIAGNOSIS — R296 Repeated falls: Secondary | ICD-10-CM

## 2021-03-25 DIAGNOSIS — E669 Obesity, unspecified: Secondary | ICD-10-CM

## 2021-03-25 MED ORDER — BLOOD GLUCOSE METER KIT: PACK | 0 refills | Status: DC

## 2021-03-25 MED ORDER — MIRABEGRON ER 25 MG PO TB24: 25.0000 mg | ORAL_TABLET | Freq: Every day | ORAL | 3 refills | Status: DC

## 2021-03-25 MED ORDER — LINAGLIPTIN 5 MG PO TABS: 5.0000 mg | ORAL_TABLET | Freq: Every day | ORAL | 3 refills | Status: DC

## 2021-03-25 NOTE — Patient Instructions (Addendum)
Annual exam in end of June call if you need me sooner Non fast hBA1C , chem 7 and EGFr 3 days before appt  New medications sent for diabetes and incontinence  Nurse pls prescribe meter and once daily  Testing supplies  You are referred to urology re incontince  Be careful not to fall  Please work on QUITTING smoking, nurse please order screening lung scan if she has a 20 pack or  More year smoking history  Thanks for choosing [facility], we consider it a privelige to serve you.

## 2021-03-26 ENCOUNTER — Encounter: Payer: Self-pay | Admitting: Family Medicine

## 2021-03-26 NOTE — Assessment & Plan Note (Signed)
Controlled, no change in medication  

## 2021-03-26 NOTE — Assessment & Plan Note (Signed)
Tammy Suarez is reminded of the importance of commitment to daily physical activity for 30 minutes or more, as able and the need to limit carbohydrate intake to 30 to 60 grams per meal to help with blood sugar control.   The need to take medication as prescribed, test blood sugar as directed, and to call between visits if there is a concern that blood sugar is uncontrolled is also discussed.  Uncontrolled start tradjenta and daily testing, re eval in 3 months  Tammy Suarez is reminded of the importance of daily foot exam, annual eye examination, and good blood sugar, blood pressure and cholesterol control.  Diabetic Labs Latest Ref Rng & Units 01/29/2021 10/04/2020 04/25/2020 12/25/2019 10/05/2019  HbA1c 4.8 - 5.6 % 8.1(H) 7.1(H) - 6.1(H) -  Microalbumin mg/dL - - - - -  Micro/Creat Ratio <30 mcg/mg creat - - - - -  Chol 100 - 199 mg/dL 77(L) 81(L) - 93 -  HDL >39 mg/dL 15(L) 16(L) - 18(L) -  Calc LDL 0 - 99 mg/dL 37 34 - 51 -  Triglycerides 0 - 149 mg/dL 145 185(H) - 160(H) -  Creatinine 0.57 - 1.00 mg/dL - 1.65(H) 1.55(H) 2.04(H) 1.95(H)   BP/Weight 03/25/2021 03/12/2021 02/03/2021 01/29/2021 12/10/2020 10/21/2020 86/05/7372  Systolic BP 668 159 470 761 518 343 735  Diastolic BP 80 70 81 74 69 98 72  Wt. (Lbs) 198.8 194 196 195.8 197 193 193  BMI 33.08 32.28 32.62 32.58 32.78 32.62 32.62   Foot/eye exam completion dates Latest Ref Rng & Units 01/23/2020 08/24/2019  Eye Exam No Retinopathy No Retinopathy -  Foot exam Order - - -  Foot Form Completion - - Done

## 2021-03-26 NOTE — Assessment & Plan Note (Signed)
  Patient re-educated about  the importance of commitment to a  minimum of 150 minutes of exercise per week as able.  The importance of healthy food choices with portion control discussed, as well as eating regularly and within a 12 hour window most days. The need to choose "clean , green" food 50 to 75% of the time is discussed, as well as to make water the primary drink and set a goal of 64 ounces water daily.    Weight /BMI 03/25/2021 03/12/2021 02/03/2021  WEIGHT 198 lb 12.8 oz 194 lb 196 lb  HEIGHT 5\' 5"  5\' 5"  5\' 5"   BMI 33.08 kg/m2 32.28 kg/m2 32.62 kg/m2

## 2021-03-26 NOTE — Assessment & Plan Note (Signed)
Hyperlipidemia:Low fat diet discussed and encouraged.   Lipid Panel  Lab Results  Component Value Date   CHOL 77 (L) 01/29/2021   HDL 15 (L) 01/29/2021   LDLCALC 37 01/29/2021   TRIG 145 01/29/2021   CHOLHDL 5.1 (H) 01/29/2021   Needs to reduce fat in diet

## 2021-03-26 NOTE — Assessment & Plan Note (Signed)
Asked:confirms currently smokes cigarettes °Assess: Unwilling to set a quit date, but is cutting back °Advise: needs to QUIT to reduce risk of cancer, cardio and cerebrovascular disease °Assist: counseled for 5 minutes and literature provided °Arrange: follow up in 2 to 4 months ° °

## 2021-03-26 NOTE — Assessment & Plan Note (Signed)
Home safetyeviewed, I also discussed the importance of reducing pain medication dose as she has many falls, he wil continue with pain management

## 2021-03-26 NOTE — Assessment & Plan Note (Signed)
Controlled, no change in medication DASH diet and commitment to daily physical activity for a minimum of 30 minutes discussed and encouraged, as a part of hypertension management. The importance of attaining a healthy weight is also discussed.  BP/Weight 03/25/2021 03/12/2021 02/03/2021 01/29/2021 12/10/2020 10/21/2020 94/11/7917  Systolic BP 957 900 920 041 593 012 379  Diastolic BP 80 70 81 74 69 98 72  Wt. (Lbs) 198.8 194 196 195.8 197 193 193  BMI 33.08 32.28 32.62 32.58 32.78 32.62 32.62

## 2021-03-26 NOTE — Progress Notes (Signed)
Tammy Suarez     MRN: [id number]      DOB: 10-Jun-1946   HPI Tammy Suarez is here for follow up and re-evaluation of chronic medical conditions, medication management and review of any available recent lab and radiology data.  Preventive health is updated, specifically  Cancer screening and Immunization.   Questions or concerns regarding consultations or procedures which the PT has had in the interim are  addressed. The PT denies any adverse reactions to current medications since the last visit.  C/o urinary incontinence  ROS Denies recent fever or chills. Denies sinus pressure, nasal congestion, ear pain or sore throat. Denies chest congestion, productive cough or wheezing. Denies chest pains, palpitations and leg swelling Denies abdominal pain, nausea, vomiting,diarrhea or constipation.   C/o increased joint pain and limitation in mobility. Denies headaches, seizures, numbness, or tingling. Denies depression, does have anxiety or insomnia. Denies skin break down or rash.   PE  BP 128/80   Pulse 66   Resp 15   Ht 5\' 5"  (1.651 m)   Wt 198 lb 12.8 oz (90.2 kg)   SpO2 96%   BMI 33.08 kg/m   Patient alert and oriented and in no cardiopulmonary distress.  HEENT: No facial asymmetry, EOMI,     Neck supple .  Chest: Clear to auscultation bilaterally.  CVS: S1, S2 no murmurs, no S3.Regular rate.  ABD: Soft non tender.   Ext: No edema  MS: decreased  ROM spine, shoulders, hips and knees.  Skin: Intact, no ulcerations or rash noted.  Psych: Good eye contact, normal affect. Memory intact not anxious or depressed appearing.  CNS: CN 2-12 intact, power,  normal throughout.no focal deficits noted.   Assessment & Plan  Essential hypertension, benign Controlled, no change in medication DASH diet and commitment to daily physical activity for a minimum of 30 minutes discussed and encouraged, as a part of hypertension management. The importance of attaining a healthy  weight is also discussed.  BP/Weight 03/25/2021 03/12/2021 02/03/2021 01/29/2021 12/10/2020 10/21/2020 37/11/6965  Systolic BP 893 810 175 102 585 277 824  Diastolic BP 80 70 81 74 69 98 72  Wt. (Lbs) 198.8 194 196 195.8 197 193 193  BMI 33.08 32.28 32.62 32.58 32.78 32.62 32.62       Hypothyroidism Controlled, no change in medication   Nicotine dependence Asked:confirms currently smokes cigarettes Assess: Unwilling to set a quit date, but is cutting back Advise: needs to QUIT to reduce risk of cancer, cardio and cerebrovascular disease Assist: counseled for 5 minutes and literature provided Arrange: follow up in 2 to 4 months   Obesity (BMI 30.0-34.9)  Patient re-educated about  the importance of commitment to a  minimum of 150 minutes of exercise per week as able.  The importance of healthy food choices with portion control discussed, as well as eating regularly and within a 12 hour window most days. The need to choose "clean , green" food 50 to 75% of the time is discussed, as well as to make water the primary drink and set a goal of 64 ounces water daily.    Weight /BMI 03/25/2021 03/12/2021 02/03/2021  WEIGHT 198 lb 12.8 oz 194 lb 196 lb  HEIGHT 5\' 5"  5\' 5"  5\' 5"   BMI 33.08 kg/m2 32.28 kg/m2 32.62 kg/m2      Recurrent falls Home safetyeviewed, I also discussed the importance of reducing pain medication dose as she has many falls, he wil continue with pain management  Urinary incontinence  sevre and uncontrolled , reer urology, start mirabetric  Hyperlipidemia with target LDL less than 70 Hyperlipidemia:Low fat diet discussed and encouraged.   Lipid Panel  Lab Results  Component Value Date   CHOL 77 (L) 01/29/2021   HDL 15 (L) 01/29/2021   LDLCALC 37 01/29/2021   TRIG 145 01/29/2021   CHOLHDL 5.1 (H) 01/29/2021   Needs to reduce fat in diet    GERD Controlled, no change in medication   DM (diabetes mellitus), type 2 with renal complications ([place]) Ms.  Suarez is reminded of the importance of commitment to daily physical activity for 30 minutes or more, as able and the need to limit carbohydrate intake to 30 to 60 grams per meal to help with blood sugar control.   The need to take medication as prescribed, test blood sugar as directed, and to call between visits if there is a concern that blood sugar is uncontrolled is also discussed.  Uncontrolled start tradjenta and daily testing, re eval in 3 months  Tammy Suarez is reminded of the importance of daily foot exam, annual eye examination, and good blood sugar, blood pressure and cholesterol control.  Diabetic Labs Latest Ref Rng & Units 01/29/2021 10/04/2020 04/25/2020 12/25/2019 10/05/2019  HbA1c 4.8 - 5.6 % 8.1(H) 7.1(H) - 6.1(H) -  Microalbumin mg/dL - - - - -  Micro/Creat Ratio <30 mcg/mg creat - - - - -  Chol 100 - 199 mg/dL 77(L) 81(L) - 93 -  HDL >39 mg/dL 15(L) 16(L) - 18(L) -  Calc LDL 0 - 99 mg/dL 37 34 - 51 -  Triglycerides 0 - 149 mg/dL 145 185(H) - 160(H) -  Creatinine 0.57 - 1.00 mg/dL - 1.65(H) 1.55(H) 2.04(H) 1.95(H)   BP/Weight 03/25/2021 03/12/2021 02/03/2021 01/29/2021 12/10/2020 10/21/2020 56/12/5636  Systolic BP 937 342 876 811 572 620 355  Diastolic BP 80 70 81 74 69 98 72  Wt. (Lbs) 198.8 194 196 195.8 197 193 193  BMI 33.08 32.28 32.62 32.58 32.78 32.62 32.62   Foot/eye exam completion dates Latest Ref Rng & Units 01/23/2020 08/24/2019  Eye Exam No Retinopathy No Retinopathy -  Foot exam Order - - -  Foot Form Completion - - Done

## 2021-03-26 NOTE — Assessment & Plan Note (Signed)
sevre and uncontrolled , reer urology, start mirabetric

## 2021-04-04 ENCOUNTER — Telehealth: Payer: Self-pay | Admitting: Family Medicine

## 2021-04-04 NOTE — Telephone Encounter (Signed)
Correction   Tried calling patient  No answer could not leave message

## 2021-04-04 NOTE — Telephone Encounter (Signed)
Left message for patient to call back and schedule Medicare Annual Wellness Visit (AWV) either virtually or in office.   Last AWV 04/09/20  please schedule at anytime with [facility]  health coach  This should be a 40 minute visit.

## 2021-04-30 LAB — CMP14+EGFR
ALT: 13 IU/L (ref 0–32)
AST: 33 IU/L (ref 0–40)
Albumin/Globulin Ratio: 1.5 (ref 1.2–2.2)
Albumin: 3.8 g/dL (ref 3.7–4.7)
Alkaline Phosphatase: 73 IU/L (ref 44–121)
BUN/Creatinine Ratio: 24 (ref 12–28)
BUN: 39 mg/dL — ABNORMAL HIGH (ref 8–27)
Bilirubin Total: 0.5 mg/dL (ref 0.0–1.2)
CO2: 22 mmol/L (ref 20–29)
Calcium: 10.2 mg/dL (ref 8.7–10.3)
Chloride: 100 mmol/L (ref 96–106)
Creatinine, Ser: 1.64 mg/dL — ABNORMAL HIGH (ref 0.57–1.00)
Globulin, Total: 2.5 g/dL (ref 1.5–4.5)
Glucose: 131 mg/dL — ABNORMAL HIGH (ref 65–99)
Potassium: 5.3 mmol/L — ABNORMAL HIGH (ref 3.5–5.2)
Sodium: 135 mmol/L (ref 134–144)
Total Protein: 6.3 g/dL (ref 6.0–8.5)
eGFR: 32 mL/min/{1.73_m2} — ABNORMAL LOW (ref >59)

## 2021-04-30 LAB — LIPID PANEL
Chol/HDL Ratio: 5.6 ratio — ABNORMAL HIGH (ref 0.0–4.4)
Cholesterol, Total: 73 mg/dL — ABNORMAL LOW (ref 100–199)
HDL: 13 mg/dL — ABNORMAL LOW (ref >39)
LDL Chol Calc (NIH): 39 mg/dL (ref 0–99)
Triglycerides: 108 mg/dL (ref 0–149)
VLDL Cholesterol Cal: 21 mg/dL (ref 5–40)

## 2021-04-30 LAB — HEMOGLOBIN A1C
Est. average glucose Bld gHb Est-mCnc: 146 mg/dL
Hgb A1c MFr Bld: 6.7 % — ABNORMAL HIGH (ref 4.8–5.6)

## 2021-04-30 LAB — TSH: TSH: 3.07 u[IU]/mL (ref 0.450–4.500)

## 2021-04-30 LAB — VITAMIN D 25 HYDROXY (VIT D DEFICIENCY, FRACTURES): Vit D, 25-Hydroxy: 36.9 ng/mL (ref 30.0–100.0)

## 2021-05-13 ENCOUNTER — Ambulatory Visit (INDEPENDENT_AMBULATORY_CARE_PROVIDER_SITE_OTHER): Payer: Medicare HMO | Admitting: Family Medicine

## 2021-05-13 ENCOUNTER — Other Ambulatory Visit: Payer: Self-pay

## 2021-05-13 ENCOUNTER — Ambulatory Visit: Payer: Medicare HMO

## 2021-05-13 ENCOUNTER — Encounter: Payer: Self-pay | Admitting: Family Medicine

## 2021-05-13 VITALS — BP 130/80 | HR 100 | Temp 98.0°F | Resp 20 | Ht 65.0 in | Wt 193.0 lb

## 2021-05-13 DIAGNOSIS — M5416 Radiculopathy, lumbar region: Secondary | ICD-10-CM

## 2021-05-13 DIAGNOSIS — M48061 Spinal stenosis, lumbar region without neurogenic claudication: Secondary | ICD-10-CM

## 2021-05-13 DIAGNOSIS — Z0000 Encounter for general adult medical examination without abnormal findings: Secondary | ICD-10-CM

## 2021-05-13 DIAGNOSIS — E1121 Type 2 diabetes mellitus with diabetic nephropathy: Secondary | ICD-10-CM

## 2021-05-13 DIAGNOSIS — F17210 Nicotine dependence, cigarettes, uncomplicated: Secondary | ICD-10-CM | POA: Diagnosis not present

## 2021-05-13 DIAGNOSIS — R195 Other fecal abnormalities: Secondary | ICD-10-CM

## 2021-05-13 DIAGNOSIS — G894 Chronic pain syndrome: Secondary | ICD-10-CM

## 2021-05-13 DIAGNOSIS — F17218 Nicotine dependence, cigarettes, with other nicotine-induced disorders: Secondary | ICD-10-CM

## 2021-05-13 LAB — HM DIABETES EYE EXAM

## 2021-05-13 NOTE — Patient Instructions (Addendum)
F/U in 3.5 months, flu vaccine at visit  Please get Covid Booster  Microalb today in office  Need shingles vaccines they are at the pharmacy  Foot exam shows poor sensation, very important to check every day, and when ready call for Podiatry to deal with callus  Please work on stopping smoknig, now at 10  Nurse please arrange lun g cancer screening test also eye exam   You are referred to pain clinic in [place]

## 2021-05-13 NOTE — Progress Notes (Signed)
Tammy Suarez     MRN: [id number]      DOB: 20-Feb-1946  HPI: Patient is in for annual physical exam. No other health concerns are expressed or addressed at the visit. Recent labs,  are reviewed. Immunization is reviewed , and  updated if needed.   PE: Pleasant  female, alert and oriented x 3, in no cardio-pulmonary distress. Afebrile. HEENT No facial trauma or asymetry. Sinuses non tender.  Extra occullar muscles intact.. External ears normal, . Neck: decreased ROM, no adenopathy,JVD or thyromegaly.No bruits.  Chest: Decreased air entry   Cardiovascular system; Heart sounds normal,  S1 and  S2 ,no S3.  No murmur, or thrill.  Abdomen: Soft, non tender, Bowel sounds normal. No guarding, tenderness or rebound.     Musculoskeletal exam: Decreased ROM of spine, hips , shoulders and knees.  deformity ,swelling or crepitus noted. .   Neurologic: Cranial nerves 2 to 12 intact. Power, tone ,sensation  normal throughout. disturbance in gait. No tremor.  Skin: Intact, no ulceration,  scaling and callus on feet. Pigmentation normal throughout  Psych; Normal mood and affect. Judgement and concentration normal   Assessment & Plan:  Annual physical exam Annual exam as documented. Counseling done  re healthy lifestyle involving commitment to 150 minutes exercise per week, heart healthy diet, and attaining healthy weight.The importance of adequate sleep also discussed. Regular seat belt use and home safety, is also discussed. Changes in health habits are decided on by the patient with goals and time frames  set for achieving them. Immunization and cancer screening needs are specifically addressed at this visit.   Nicotine dependence Asked:confirms currently smokes cigarettes 10/day Assess: Unwilling to set a quit date, but is cutting back Advise: needs to QUIT to reduce risk of cancer, cardio and cerebrovascular disease Assist: counseled for 5 minutes and  literature provided Arrange: follow up in 2 to 4 months   Spinal stenosis of lumbar region with radiculopathy Reports uncontrolled pain management , opioid dose has been reduced due to recurrent falls, states not beneficial, will refer to Pain mx in [place]  DM (diabetes mellitus), type 2 with renal complications ([place]) Tammy Suarez is reminded of the importance of commitment to daily physical activity for 30 minutes or more, as able and the need to limit carbohydrate intake to 30 to 60 grams per meal to help with blood sugar control.   The need to take medication as prescribed, test blood sugar as directed, and to call between visits if there is a concern that blood sugar is uncontrolled is also discussed.   Tammy Suarez is reminded of the importance of daily foot exam, annual eye examination, and good blood sugar, blood pressure and cholesterol control.  Diabetic Labs Latest Ref Rng & Units 05/13/2021 04/29/2021 01/29/2021 10/04/2020 04/25/2020  HbA1c 4.8 - 5.6 % - 6.7(H) 8.1(H) 7.1(H) -  Microalbumin mg/dL - - - - -  Micro/Creat Ratio 0 - 29 mg/g creat 206(H) - - - -  Chol 100 - 199 mg/dL - 73(L) 77(L) 81(L) -  HDL >39 mg/dL - 13(L) 15(L) 16(L) -  Calc LDL 0 - 99 mg/dL - 39 37 34 -  Triglycerides 0 - 149 mg/dL - 108 145 185(H) -  Creatinine 0.57 - 1.00 mg/dL - 1.64(H) - 1.65(H) 1.55(H)   BP/Weight 05/15/2021 05/13/2021 03/25/2021 03/12/2021 02/03/2021 01/29/2021 1/50/5697  Systolic BP 948 016 553 748 270 786 754  Diastolic BP 81 80 80 70 81 74 69  Wt. (Lbs)  193 193 198.8 194 196 195.8 197  BMI 32.12 32.12 33.08 32.28 32.62 32.58 32.78   Foot/eye exam completion dates Latest Ref Rng & Units 05/13/2021 01/23/2020  Eye Exam No Retinopathy No Retinopathy No Retinopathy  Foot exam Order - - -  Foot Form Completion - - -

## 2021-05-15 ENCOUNTER — Ambulatory Visit: Payer: Medicare HMO | Admitting: Urology

## 2021-05-15 ENCOUNTER — Encounter: Payer: Self-pay | Admitting: *Deleted

## 2021-05-15 ENCOUNTER — Encounter: Payer: Self-pay | Admitting: Urology

## 2021-05-15 ENCOUNTER — Other Ambulatory Visit: Payer: Self-pay

## 2021-05-15 VITALS — BP 143/81 | HR 105 | Wt 193.0 lb

## 2021-05-15 DIAGNOSIS — N3946 Mixed incontinence: Secondary | ICD-10-CM

## 2021-05-15 DIAGNOSIS — N952 Postmenopausal atrophic vaginitis: Secondary | ICD-10-CM | POA: Diagnosis not present

## 2021-05-15 DIAGNOSIS — N39498 Other specified urinary incontinence: Secondary | ICD-10-CM | POA: Diagnosis not present

## 2021-05-15 DIAGNOSIS — N1832 Chronic kidney disease, stage 3b: Secondary | ICD-10-CM | POA: Diagnosis not present

## 2021-05-15 LAB — URINALYSIS, ROUTINE W REFLEX MICROSCOPIC
Bilirubin, UA: NEGATIVE
Glucose, UA: NEGATIVE
Nitrite, UA: NEGATIVE
RBC, UA: NEGATIVE
Specific Gravity, UA: 1.02 (ref 1.005–1.030)
Urobilinogen, Ur: 1 mg/dL (ref 0.2–1.0)
pH, UA: 6 (ref 5.0–7.5)

## 2021-05-15 LAB — MICROSCOPIC EXAMINATION
Epithelial Cells (non renal): >10 /hpf — AB (ref 0–10)
RBC, Urine: NONE SEEN /hpf (ref 0–2)
Renal Epithel, UA: NONE SEEN /hpf

## 2021-05-15 LAB — MICROALBUMIN / CREATININE URINE RATIO
Creatinine, Urine: 73.8 mg/dL
Microalb/Creat Ratio: 206 mg/g creat — ABNORMAL HIGH (ref 0–29)
Microalbumin, Urine: 151.7 ug/mL

## 2021-05-15 LAB — BLADDER SCAN AMB NON-IMAGING: Scan Result: 44

## 2021-05-15 MED ORDER — MIRABEGRON ER 25 MG PO TB24: 25.0000 mg | ORAL_TABLET | Freq: Every day | ORAL | 0 refills | Status: DC

## 2021-05-15 NOTE — Progress Notes (Signed)
PVR- 44   Urological Symptom Review  Patient is experiencing the following symptoms: Hard to postpone urination Get up at night to urinate Leakage of urine   Review of Systems  Gastrointestinal (upper)  : Negative for upper GI symptoms  Gastrointestinal (lower) : Negative for lower GI symptoms  Constitutional : Negative for symptoms  Skin: Negative for skin symptoms  Eyes: Negative for eye symptoms  Ear/Nose/Throat : Sinus problems  Hematologic/Lymphatic: Easy bruising  Cardiovascular : Negative for cardiovascular symptoms  Respiratory : Negative for respiratory symptoms  Endocrine: Negative for endocrine symptoms  Musculoskeletal: Back pain Joint pain  Neurological: Headaches Dizziness  Psychologic: Negative for psychiatric symptoms

## 2021-05-15 NOTE — Progress Notes (Signed)
Subjective: 1. Mixed incontinence urge and stress   2. Vaginal atrophy   3. Stage 3b chronic kidney disease [facility])      Consult requested by Dr. Tula Nakayama.  Tammy Suarez is a 75 yo female who is sent for evaluation of urinary incontinence.  She had no leakage until about a year ago.   She has SUI and UUI.  The SUI is minor but the UUI is significant and she uses 3ppd. She was given oxybutynin ER $RemoveBefor'5mg'RIzfaJfSJoNR$  in 2/22 without benefit.  She has  a good stream and she feels she is emptying.  She has had no hematuria or dysuria.  She has had no UTI's.  She feels like her bladder has dropped.  She is G3 P3 with NVD.  She has a history of uterine cancer treated with a hysterectomy about 40 years ago but she has had no radiation.  There is a comment in her records about a vesicovaginal fistula repair with the hysterectomy.   She has no history of stones.  She has had no other GU surgery.  Her PVR is 45ml and her UA today has 6-10WBC, >10 epis and many bacteria.  She has CKD 3b with a GFR of >30. ROS:  ROS  Allergies  Allergen Reactions   Gabapentin Other (See Comments)    Feels dizzy   Meperidine Hcl Nausea And Vomiting   Niacin Other (See Comments)    REACTION: face peeling and burning   Propoxyphene N-Acetaminophen Nausea And Vomiting   Hydrocodone-Acetaminophen Nausea And Vomiting    Past Medical History:  Diagnosis Date   Arthritis    Carpal tunnel syndrome    Chronic back pain    CKD (chronic kidney disease) stage 3, GFR 30-59 ml/min (HCC)    Coronary atherosclerosis of native coronary artery    Multivessel status post CABG - LIMA to LAD, SVG to OM and SVG to PDA   Depression    Economic stress    Essential hypertension    GERD (gastroesophageal reflux disease)    Headache    History of conjunctivitis    History of hiatal hernia    Hyperlipidemia    Hypothyroidism    Myocardial infarction ([place]) 1999   Obesity    Type 2 diabetes mellitus (HCC)    Uterine cancer (HCC)    Vitamin B 12  deficiency     Past Surgical History:  Procedure Laterality Date   ABDOMINAL HYSTERECTOMY     BACK SURGERY     CARDIAC SURGERY     CARPAL TUNNEL RELEASE Left 07/23/2016   Procedure: CARPAL TUNNEL RELEASE;  Surgeon: Carole Civil, MD;  Location: [facility];  Service: Orthopedics;  Laterality: Left;   CERVICAL LAMINECTOMY     CHOLECYSTECTOMY     COLONOSCOPY  2007   Dr. Irving Shows: reported to be normal   COLONOSCOPY WITH PROPOFOL N/A 04/29/2020   Procedure: COLONOSCOPY WITH PROPOFOL;  Surgeon: Daneil Dolin, MD;  Location: [facility];  Service: Endoscopy;  Laterality: N/A;  12:45pm   CORONARY ARTERY BYPASS GRAFT N/A 11/02/2013   Procedure: CORONARY ARTERY BYPASS GRAFTING (CABG);  Surgeon: Gaye Pollack, MD;  Location: [place];  Service: Open Heart Surgery;  Laterality: N/A;  CABG x three, using left internal mammary artery and right leg greater saphenous vein harvested endoscopically   COSMETIC SURGERY  07/01/2012   Recurrent skin infection on lower abdomen, [facility]   HERNIA REPAIR     Inguinal herniorrhaphy     INTRAOPERATIVE  TRANSESOPHAGEAL ECHOCARDIOGRAM N/A 11/02/2013   Procedure: INTRAOPERATIVE TRANSESOPHAGEAL ECHOCARDIOGRAM;  Surgeon: Gaye Pollack, MD;  Location[facility] OR;  Service: Open Heart Surgery;  Laterality: N/A;   LAPAROSCOPIC GASTRIC BANDING  2009   LEFT HEART CATHETERIZATION WITH CORONARY ANGIOGRAM N/A 10/30/2013   Procedure: LEFT HEART CATHETERIZATION WITH CORONARY ANGIOGRAM;  Surgeon: Minus Breeding, MD;  Location: [facility] CATH LAB;  Service: Cardiovascular;  Laterality: N/A;   Left index finger repair     LUMBAR FUSION     POLYPECTOMY  04/29/2020   Procedure: POLYPECTOMY;  Surgeon: Daneil Dolin, MD;  Location: [facility];  Service: Endoscopy;;   SPINE SURGERY     VESICOVAGINAL FISTULA CLOSURE W/ TAH      Social History   Socioeconomic History   Marital status: Single    Spouse name: Not on file   Number of children: 3   Years of education: Not on file    Highest education level: GED or equivalent  Occupational History   Not on file  Tobacco Use   Smoking status: Every Day    Packs/day: 0.25    Years: 50.00    Pack years: 12.50    Types: Cigarettes   Smokeless tobacco: Never   Tobacco comments:    smokes about 10 a day   Vaping Use   Vaping Use: Never used  Substance and Sexual Activity   Alcohol use: No    Alcohol/week: 0.0 standard drinks   Drug use: No   Sexual activity: Never    Birth control/protection: Surgical  Other Topics Concern   Not on file  Social History Narrative   Not on file   Social Determinants of Health   Financial Resource Strain: Not on file  Food Insecurity: Not on file  Transportation Needs: Not on file  Physical Activity: Not on file  Stress: Not on file  Social Connections: Not on file  Intimate Partner Violence: Not on file    Family History  Problem Relation Age of Onset   Heart attack Mother    Heart attack Father    Hypertension Sister    Heart attack Sister    Cancer Brother        Renal cell    Hepatitis Daughter    Drug abuse Daughter    Hypertension Daughter    Colon cancer Neg Hx     Anti-infectives: Anti-infectives (From admission, onward)    None       Current Outpatient Medications  Medication Sig Dispense Refill   blood glucose meter kit and supplies Dispense based on patient and insurance preference.Once daily testing dx e11.9. 1 each 0   busPIRone (BUSPAR) 10 MG tablet TAKE 1 TABLET(10 MG) BY MOUTH THREE TIMES DAILY 90 tablet 2   DULoxetine (CYMBALTA) 60 MG capsule TAKE ONE CAPSULE BY MOUTH TWICE DAILY 180 capsule 0   ezetimibe (ZETIA) 10 MG tablet TAKE 1 TABLET(10 MG) BY MOUTH DAILY 90 tablet 1   famotidine (PEPCID) 20 MG tablet TAKE 1 TABLET(20 MG) BY MOUTH TWICE DAILY 180 tablet 1   fenofibrate (TRICOR) 145 MG tablet Take 1 tablet (145 mg total) by mouth daily. 90 tablet 1   isosorbide mononitrate (IMDUR) 30 MG 24 hr tablet TAKE 1 TABLET(30 MG) BY MOUTH AT  BEDTIME 30 tablet 11   levothyroxine (SYNTHROID) 100 MCG tablet TAKE 1 TABLET(100 MCG) BY MOUTH DAILY 90 tablet 3   losartan (COZAAR) 100 MG tablet Take 1 tablet (100 mg total) by mouth daily. 90 tablet 1  metoprolol succinate (TOPROL-XL) 50 MG 24 hr tablet TAKE 1 TABLET(50 MG) BY MOUTH DAILY WITH OR IMMEDIATELY FOLLOWING A MEAL 90 tablet 0   mirabegron ER (MYRBETRIQ) 25 MG TB24 tablet Take 1 tablet (25 mg total) by mouth daily. Increased to $RemoveBefo'50mg'RBslamsvzQZ$  if not better in 2 weeks.  $Remov'50mg'qkHqiA$  #28 samples given as well. 28 tablet 0   rosuvastatin (CRESTOR) 40 MG tablet TAKE 1 TABLET(40 MG) BY MOUTH DAILY 90 tablet 2   No current facility-administered medications for this visit.     Objective: Vital signs in last 24 hours: BP (!) 143/81   Pulse (!) 105   Wt 193 lb (87.5 kg)   BMI 32.12 kg/m   Intake/Output from previous day: No intake/output data recorded. Intake/Output this shift: $RemoveBef'@IOTHISSHIFT'NZGdCVqXLV$ @   Physical Exam Vitals reviewed.  Constitutional:      Appearance: Normal appearance.  HENT:     Head: Normocephalic and atraumatic.  Cardiovascular:     Rate and Rhythm: Normal rate and regular rhythm.     Heart sounds: Normal heart sounds.  Pulmonary:     Effort: Pulmonary effort is normal. No respiratory distress.     Breath sounds: Normal breath sounds.  Abdominal:     General: Abdomen is flat.     Palpations: Abdomen is soft.     Tenderness: There is abdominal tenderness (mild diffuse).     Hernia: No hernia is present.  Genitourinary:    Comments: Nl external genitalia but some perineal fat accumulation with a long vaginal canal. Severe mucosal atrophy. Normal urethral meatus with minimal urethral hypermobility and no leakage with a hard cough. No significant prolapse. Bladder without mass. No adnexal masses. S/p hysterectomy.  No perineal lesions.  Musculoskeletal:        General: No swelling or tenderness. Normal range of motion.  Skin:    General: Skin is warm and dry.   Neurological:     General: No focal deficit present.     Mental Status: She is alert and oriented to person, place, and time.  Psychiatric:        Mood and Affect: Mood normal.        Behavior: Behavior normal.    Lab Results:  Results for orders placed or performed in visit on 05/15/21 (from the past 24 hour(s))  Urinalysis, Routine w reflex microscopic     Status: Abnormal   Collection Time: 05/15/21 10:59 AM  Result Value Ref Range   Specific Gravity, UA 1.020 1.005 - 1.030   pH, UA 6.0 5.0 - 7.5   Color, UA Amber (A) Yellow   Appearance Ur Clear Clear   Leukocytes,UA Trace (A) Negative   Protein,UA 2+ (A) Negative/Trace   Glucose, UA Negative Negative   Ketones, UA Trace (A) Negative   RBC, UA Negative Negative   Bilirubin, UA Negative Negative   Urobilinogen, Ur 1.0 0.2 - 1.0 mg/dL   Nitrite, UA Negative Negative   Microscopic Examination See below:    Narrative   Performed at:  [facility] [address] [place], [place]  [id number] Lab Director: Mina Marble [place], Phone:  [phone]  Microscopic Examination     Status: Abnormal   Collection Time: 05/15/21 10:59 AM   Urine  Result Value Ref Range   WBC, UA 6-10 (A) 0 - 5 /hpf   RBC None seen 0 - 2 /hpf   Epithelial Cells (non renal) >10 (A) 0 - 10 /hpf   Renal Epithel, UA None seen None seen /  hpf   Mucus, UA Present Not Estab.   Bacteria, UA Many (A) None seen/Few   Narrative   Performed at:  [facility] [address], [place], [place]  [id number] Lab Director: [place], Phone:  [phone]    BMET No results for input(s): NA, K, CL, CO2, GLUCOSE, BUN, CREATININE, CALCIUM in the last 72 hours. PT/INR No results for input(s): LABPROT, INR in the last 72 hours. ABG No results for input(s): PHART, HCO3 in the last 72 hours.  Invalid input(s): PCO2, PO2 UA reviewed.  Studies/Results: No results found.   Assessment/Plan: MUI with urge predominance.   I am going to  try her on Myrbetriq and if that is not effective, I will consider urodynamics.  Side effects of the med reviewed in detail.  Her GFR was about 32 which is borderline for the $Remov'50mg'RQJeNQ$  dose, but it is above 30 which is the cut off.  I will start her on $Remo'25mg'ZQmJg$  and increase if needed.   Vaginal atrophy.   She has no prolapse.   I will consider topical estrogen therapy.   Meds ordered this encounter  Medications   mirabegron ER (MYRBETRIQ) 25 MG TB24 tablet    Sig: Take 1 tablet (25 mg total) by mouth daily. Increased to $RemoveBefo'50mg'ggONHrJSBuu$  if not better in 2 weeks.  $Remov'50mg'wXWRJI$  #28 samples given as well.    Dispense:  28 tablet    Refill:  0      Orders Placed This Encounter  Procedures   Urine Culture   Microscopic Examination   Urinalysis, Routine w reflex microscopic   BLADDER SCAN AMB NON-IMAGING     Return in about 6 weeks (around 06/26/2021) for with PVR. Marland Kitchen    CC: Dr. Tula Nakayama.      Irine Seal 05/15/2021 [phone]

## 2021-05-17 LAB — URINE CULTURE

## 2021-05-19 NOTE — Assessment & Plan Note (Signed)

## 2021-05-19 NOTE — Assessment & Plan Note (Signed)
Tammy Suarez is reminded of the importance of commitment to daily physical activity for 30 minutes or more, as able and the need to limit carbohydrate intake to 30 to 60 grams per meal to help with blood sugar control.   The need to take medication as prescribed, test blood sugar as directed, and to call between visits if there is a concern that blood sugar is uncontrolled is also discussed.   Tammy Suarez is reminded of the importance of daily foot exam, annual eye examination, and good blood sugar, blood pressure and cholesterol control.  Diabetic Labs Latest Ref Rng & Units 05/13/2021 04/29/2021 01/29/2021 10/04/2020 04/25/2020  HbA1c 4.8 - 5.6 % - 6.7(H) 8.1(H) 7.1(H) -  Microalbumin mg/dL - - - - -  Micro/Creat Ratio 0 - 29 mg/g creat 206(H) - - - -  Chol 100 - 199 mg/dL - 73(L) 77(L) 81(L) -  HDL >39 mg/dL - 13(L) 15(L) 16(L) -  Calc LDL 0 - 99 mg/dL - 39 37 34 -  Triglycerides 0 - 149 mg/dL - 108 145 185(H) -  Creatinine 0.57 - 1.00 mg/dL - 1.64(H) - 1.65(H) 1.55(H)   BP/Weight 05/15/2021 05/13/2021 03/25/2021 03/12/2021 02/03/2021 01/29/2021 6/71/2458  Systolic BP 099 833 825 053 976 734 193  Diastolic BP 81 80 80 70 81 74 69  Wt. (Lbs) 193 193 198.8 194 196 195.8 197  BMI 32.12 32.12 33.08 32.28 32.62 32.58 32.78   Foot/eye exam completion dates Latest Ref Rng & Units 05/13/2021 01/23/2020  Eye Exam No Retinopathy No Retinopathy No Retinopathy  Foot exam Order - - -  Foot Form Completion - - -

## 2021-05-19 NOTE — Assessment & Plan Note (Signed)
Asked:confirms currently smokes cigarettes 10/day Assess: Unwilling to set a quit date, but is cutting back Advise: needs to QUIT to reduce risk of cancer, cardio and cerebrovascular disease Assist: counseled for 5 minutes and literature provided Arrange: follow up in 2 to 4 months  

## 2021-05-19 NOTE — Assessment & Plan Note (Signed)
Reports uncontrolled pain management , opioid dose has been reduced due to recurrent falls, states not beneficial, will refer to Pain mx in [facility]

## 2021-05-20 ENCOUNTER — Ambulatory Visit: Payer: Medicare HMO | Admitting: Urology

## 2021-05-30 ENCOUNTER — Telehealth: Payer: Self-pay

## 2021-05-30 NOTE — Telephone Encounter (Signed)
-----   Message from Irine Seal, MD sent at 05/20/2021  8:43 AM EDT ----- Negative culture ----- Message ----- From: Iris Pert, LPN Sent: 12/21/2477   8:36 AM EDT To: Irine Seal, MD  Please  review

## 2021-06-09 ENCOUNTER — Other Ambulatory Visit: Payer: Self-pay | Admitting: Family Medicine

## 2021-06-09 DIAGNOSIS — F418 Other specified anxiety disorders: Secondary | ICD-10-CM

## 2021-06-10 ENCOUNTER — Ambulatory Visit: Payer: Medicare HMO | Admitting: Family Medicine

## 2021-06-18 ENCOUNTER — Ambulatory Visit (HOSPITAL_COMMUNITY): Payer: Medicare HMO

## 2021-07-03 ENCOUNTER — Ambulatory Visit: Payer: Medicare HMO | Admitting: Urology

## 2021-07-03 DIAGNOSIS — N3946 Mixed incontinence: Secondary | ICD-10-CM

## 2021-07-18 ENCOUNTER — Ambulatory Visit (INDEPENDENT_AMBULATORY_CARE_PROVIDER_SITE_OTHER): Payer: Medicare HMO

## 2021-07-18 ENCOUNTER — Other Ambulatory Visit: Payer: Self-pay

## 2021-07-18 DIAGNOSIS — Z0000 Encounter for general adult medical examination without abnormal findings: Secondary | ICD-10-CM

## 2021-07-18 NOTE — Progress Notes (Signed)
Subjective:   Tammy Suarez is a 75 y.o. female who presents for Medicare Annual (Subsequent) preventive examination.I connected with  [name] on 07/18/21 by a audio enabled telemedicine application and verified that I am speaking with the correct person using two identifiers.   I discussed the limitations of evaluation and management by telemedicine. The patient expressed understanding and agreed to proceed.   Location of patient:Home  Location of Provider: Office  Persons participating in virtual visit: Tammy Suarez (patient) and Edgar Frisk, CMA  Review of Systems    Defer to PCP       Objective:    Today's Vitals   07/18/21 1639  PainSc: 9    There is no height or weight on file to calculate BMI.  Advanced Directives 07/18/2021 04/29/2020 04/25/2020 10/04/2019 09/07/2019 07/05/2017 05/05/2017  Does Patient Have a Medical Advance Directive? _0  No No  Would patient like information on creating a medical advance directive? - No - Patient declined No - Patient declined No - Patient declined - Yes (MAU/Ambulatory/Procedural Areas - Information given) No - Patient declined  Pre-existing out of facility DNR order (yellow form or pink MOST form) - - - - - - -    Current Medications (verified) Outpatient Encounter Medications as of 07/18/2021  Medication Sig   busPIRone (BUSPAR) 10 MG tablet TAKE 1 TABLET(10 MG) BY MOUTH THREE TIMES DAILY   DULoxetine (CYMBALTA) 60 MG capsule TAKE 1 CAPSULE BY MOUTH TWICE DAILY   ezetimibe (ZETIA) 10 MG tablet TAKE 1 TABLET(10 MG) BY MOUTH DAILY   famotidine (PEPCID) 20 MG tablet TAKE 1 TABLET(20 MG) BY MOUTH TWICE DAILY   fenofibrate (TRICOR) 145 MG tablet Take 1 tablet (145 mg total) by mouth daily.   furosemide (LASIX) 20 MG tablet Take by mouth.   isosorbide mononitrate (IMDUR) 30 MG 24 hr tablet TAKE 1 TABLET(30 MG) BY MOUTH AT BEDTIME   levothyroxine (SYNTHROID) 100 MCG tablet TAKE 1 TABLET(100 MCG) BY MOUTH DAILY   losartan  (COZAAR) 100 MG tablet TAKE 1 TABLET(100 MG) BY MOUTH DAILY   metoprolol succinate (TOPROL-XL) 50 MG 24 hr tablet TAKE 1 TABLET(50 MG) BY MOUTH DAILY WITH OR IMMEDIATELY FOLLOWING A MEAL   mirabegron ER (MYRBETRIQ) 25 MG TB24 tablet Take 1 tablet (25 mg total) by mouth daily. Increased to 29m if not better in 2 weeks.  532m#28 samples given as well.   oxyCODONE-acetaminophen (PERCOCET/ROXICET) 5-325 MG tablet Take 1 tablet by mouth every 4 (four) hours as needed for severe pain.   rosuvastatin (CRESTOR) 40 MG tablet TAKE 1 TABLET(40 MG) BY MOUTH DAILY   blood glucose meter kit and supplies Dispense based on patient and insurance preference.Once daily testing dx e11.9. (Patient not taking: Reported on 07/18/2021)   No facility-administered encounter medications on file as of 07/18/2021.    Allergies (verified) Gabapentin, Meperidine hcl, Niacin, Propoxyphene n-acetaminophen, and Hydrocodone-acetaminophen   History: Past Medical History:  Diagnosis Date   Arthritis    Carpal tunnel syndrome    Chronic back pain    CKD (chronic kidney disease) stage 3, GFR 30-59 ml/min (HCC)    Coronary atherosclerosis of native coronary artery    Multivessel status post CABG - LIMA to LAD, SVG to OM and SVG to PDA   Depression    Economic stress    Essential hypertension    GERD (gastroesophageal reflux disease)    Headache    History of conjunctivitis    History of hiatal  hernia    Hyperlipidemia    Hypothyroidism    Myocardial infarction ([place]) 1999   Obesity    Type 2 diabetes mellitus ([place])    Uterine cancer ([place])    Vitamin B 12 deficiency    Past Surgical History:  Procedure Laterality Date   ABDOMINAL HYSTERECTOMY     BACK SURGERY     CARDIAC SURGERY     CARPAL TUNNEL RELEASE Left 07/23/2016   Procedure: CARPAL TUNNEL RELEASE;  Surgeon: Carole Civil, MD;  Location: [facility];  Service: Orthopedics;  Laterality: Left;   CERVICAL LAMINECTOMY     CHOLECYSTECTOMY     COLONOSCOPY  2007    Dr. Irving Shows: reported to be normal   COLONOSCOPY WITH PROPOFOL N/A 04/29/2020   Procedure: COLONOSCOPY WITH PROPOFOL;  Surgeon: Daneil Dolin, MD;  Location: [facility];  Service: Endoscopy;  Laterality: N/A;  12:45pm   CORONARY ARTERY BYPASS GRAFT N/A 11/02/2013   Procedure: CORONARY ARTERY BYPASS GRAFTING (CABG);  Surgeon: Gaye Pollack, MD;  Location: [facility];  Service: Open Heart Surgery;  Laterality: N/A;  CABG x three, using left internal mammary artery and right leg greater saphenous vein harvested endoscopically   COSMETIC SURGERY  07/01/2012   Recurrent skin infection on lower abdomen, [facility]   HERNIA REPAIR     Inguinal herniorrhaphy     INTRAOPERATIVE TRANSESOPHAGEAL ECHOCARDIOGRAM N/A 11/02/2013   Procedure: INTRAOPERATIVE TRANSESOPHAGEAL ECHOCARDIOGRAM;  Surgeon: Gaye Pollack, MD;  Location: [facility];  Service: Open Heart Surgery;  Laterality: N/A;   LAPAROSCOPIC GASTRIC BANDING  2009   LEFT HEART CATHETERIZATION WITH CORONARY ANGIOGRAM N/A 10/30/2013   Procedure: LEFT HEART CATHETERIZATION WITH CORONARY ANGIOGRAM;  Surgeon: Minus Breeding, MD;  Location: [facility] CATH LAB;  Service: Cardiovascular;  Laterality: N/A;   Left index finger repair     LUMBAR FUSION     POLYPECTOMY  04/29/2020   Procedure: POLYPECTOMY;  Surgeon: Daneil Dolin, MD;  [facility];  Service: Endoscopy;;   SPINE SURGERY     VESICOVAGINAL FISTULA CLOSURE W/ TAH     Family History  Problem Relation Age of Onset   Heart attack Mother    Heart attack Father    Hypertension Sister    Heart attack Sister    Cancer Brother        Renal cell    Hepatitis Daughter    Drug abuse Daughter    Hypertension Daughter    Colon cancer Neg Hx    Social History   Socioeconomic History   Marital status: Single    Spouse name: Not on file   Number of children: 3   Years of education: Not on file   Highest education level: GED or equivalent  Occupational History   Not on file  Tobacco Use    Smoking status: Every Day    Packs/day: 0.25    Years: 50.00    Pack years: 12.50    Types: Cigarettes   Smokeless tobacco: Never   Tobacco comments:    smokes about 10 a day   Vaping Use   Vaping Use: Never used  Substance and Sexual Activity   Alcohol use: No    Alcohol/week: 0.0 standard drinks   Drug use: No   Sexual activity: Never    Birth control/protection: Surgical  Other Topics Concern   Not on file  Social History Narrative   Not on file   Social Determinants of Health   Financial Resource Strain: Low  Risk    Difficulty of Paying Living Expenses: Not hard at all  Food Insecurity: No Food Insecurity   Worried About [place] in the Last Year: Never true   Ran Out of Food in the Last Year: Never true  Transportation Needs: No Transportation Needs   Lack of Transportation (Medical): No   Lack of Transportation (Non-Medical): No  Physical Activity: Inactive   Days of Exercise per Week: 0 days   Minutes of Exercise per Session: 0 min  Stress: No Stress Concern Present   Feeling of Stress : Only a little  Social Connections: Socially Isolated   Frequency of Communication with Friends and Family: Three times a week   Frequency of Social Gatherings with Friends and Family: Three times a week   Attends Religious Services: Never   Active Member of Clubs or Organizations: No   Attends Archivist Meetings: Never   Marital Status: Widowed    Tobacco Counseling Ready to quit: Not Answered Counseling given: Not Answered Tobacco comments: smokes about 10 a day    Clinical Intake:  Pre-visit preparation completed: Yes  Pain : 0-10 Pain Score: 9  Pain Type: Chronic pain Pain Location: Other (Comment) (joint pain) Pain Descriptors / Indicators: Aching Pain Relieving Factors: pain management meds  Pain Relieving Factors: pain management meds     How often do you need to have someone help you when you read instructions, pamphlets, or other  written materials from your doctor or pharmacy?: 1 - Never What is the last grade level you completed in school?: GED  Diabetic?Yes Nutrition Risk Assessment:  Has the patient had any N/V/D within the last 2 months?  No  Does the patient have any non-healing wounds?  No  Has the patient had any unintentional weight loss or weight gain?  No   Diabetes:  Is the patient diabetic?  Yes  If diabetic, was a CBG obtained today?  No  Did the patient bring in their glucometer from home?  No  How often do you monitor your CBG's? Not currently monitoring .   Financial Strains and Diabetes Management:  Are you having any financial strains with the device, your supplies or your medication? Yes . Patient stated she is unable to afford device and strips. Does the patient want to be seen by Chronic Care Management for management of their diabetes?  No  Would the patient like to be referred to a Nutritionist or for Diabetic Management?  No   Diabetic Exams:  Diabetic Eye Exam: Completed 05/13/2021 Diabetic Foot Exam: Completed 05/19/2021    Interpreter Needed?: No  Information entered by ::  J,CMA   Activities of Daily Living In your present state of health, do you have any difficulty performing the following activities: 07/18/2021  Hearing? N  Vision? N  Difficulty concentrating or making decisions? Y  Walking or climbing stairs? Y  Dressing or bathing? N  Doing errands, shopping? N  Preparing Food and eating ? N  Using the Toilet? N  In the past six months, have you accidently leaked urine? Y  Do you have problems with loss of bowel control? N  Managing your Medications? N  Managing your Finances? N  Housekeeping or managing your Housekeeping? N  Some recent data might be hidden    Patient Care Team: Fayrene Helper, MD as PCP - l Domenic Polite Aloha Gell, MD as PCP - Cardiology (Cardiology) Earnie Larsson, MD as Consulting Physician (Neurosurgery) Carole Civil, MD  as  Consulting Physician (Orthopedic Surgery) Phillips Odor, MD as Consulting Physician (Neurology) Gala Romney Cristopher Estimable, MD as Consulting Physician (Gastroenterology)  Indicate any recent [place] you may have received from other than [facility] providers in the past year (date may be approximate).     Assessment:   This is a routine wellness examination for Tammy Suarez.  Hearing/Vision screen No results found.  Dietary issues and exercise activities discussed: Current Exercise Habits: The patient does not participate in regular exercise at present   Goals Addressed   None    Depression Screen PHQ 2/9 Scores 07/18/2021 05/13/2021 01/29/2021 12/10/2020 04/09/2020 03/22/2020 02/16/2020  PHQ - 2 Score _0 0 0 0  PHQ- 9 Score - 18 12 - 0 2 7  Exception Documentation - - - - - - -    Fall Risk Fall Risk  07/18/2021 05/13/2021 03/25/2021 01/29/2021 12/10/2020  Falls in the past year? 0 _1 0  Number falls in past yr: 0 1 1 0 0  Injury with Fall? 0 0 1 1 0  Risk Factor Category  - - - - -  Risk for fall due to : Impaired balance/gait Impaired balance/gait;Impaired mobility - History of fall(s);Impaired balance/gait -  Follow up Falls evaluation completed Falls evaluation completed - Falls evaluation completed;Education provided;Falls prevention discussed;Follow up appointment -    FALL RISK PREVENTION PERTAINING TO THE HOME:  Any stairs in or around the home? Yes  If so, are there any without handrails? No  Home free of loose throw rugs in walkways, pet beds, electrical cords, etc? Yes  Adequate lighting in your home to reduce risk of falls? Yes   ASSISTIVE DEVICES UTILIZED TO PREVENT FALLS:  Life alert? No  Use of a cane, walker or w/c? Yes  Grab bars in the bathroom? Yes  Shower chair or bench in shower? No  Elevated toilet seat or a handicapped toilet? Yes   TIMED UP AND GO:  Was the test performed?  N/A .  Length of time to ambulate 10 feet: N/A sec.     Cognitive Function:      6CIT Screen 07/18/2021 04/09/2020 04/05/2019 07/05/2017  What Year? 0 points 0 points 0 points 0 points  What month? 0 points 0 points 0 points 0 points  What time? 0 points 0 points 0 points 0 points  Count back from 20 0 points 0 points 0 points 0 points  Months in reverse 0 points 0 points 0 points 0 points  Repeat phrase 10 points 4 points 4 points 0 points  Total Score _2 0    Immunizations Immunization History  Administered Date(s) Administered   Fluad Quad(high Dose 65+) 08/24/2019, 10/04/2020   Influenza Split 10/20/2011, 08/08/2012   Influenza Whole 09/23/2009, 09/26/2010   Influenza,inj,Quad PF,6+ Mos 07/28/2013, 09/03/2014, 10/02/2015, 09/09/2016, 09/27/2017, 08/02/2018   PFIZER(Purple Top)SARS-COV-2 Vaccination 05/29/2020, 07/22/2020   Pneumococcal Conjugate-13 01/30/2015   Pneumococcal Polysaccharide-23 01/30/2011   Td 06/13/2010   Zoster, Live 12/09/2015    TDAP status: Due, Education has been provided regarding the importance of this vaccine. Advised may receive this vaccine at local pharmacy or Health Dept. Aware to provide a copy of the vaccination record if obtained from local pharmacy or Health Dept. Verbalized acceptance and understanding.  Flu Vaccine status: Due, Education has been provided regarding the importance of this vaccine. Advised may receive this vaccine at local pharmacy or Health Dept. Aware to provide a copy of the vaccination record if obtained  from local pharmacy or Health Dept. Verbalized acceptance and understanding.  Pneumococcal vaccine status: Up to date  Covid-19 vaccine status: Information provided on how to obtain vaccines.   Qualifies for Shingles Vaccine? Yes   Zostavax completed  N/A   Shingrix Completed?: No.    Education has been provided regarding the importance of this vaccine. Patient has been advised to call insurance company to determine out of pocket expense if they have not yet received this vaccine. Advised may also receive  vaccine at local pharmacy or Health Dept. Verbalized acceptance and understanding.  Screening Tests Health Maintenance  Topic Date Due   Zoster Vaccines- Shingrix (1 of 2) Never done   TETANUS/TDAP  06/13/2020   COVID-19 Vaccine (3 - Pfizer risk series) 08/19/2020   INFLUENZA VACCINE  06/16/2021   HEMOGLOBIN A1C  10/29/2021   OPHTHALMOLOGY EXAM  05/13/2022   FOOT EXAM  05/19/2022   COLONOSCOPY (Pts 45-53yr Insurance coverage will need to be confirmed)  04/29/2030   DEXA SCAN  Completed   Hepatitis C Screening  Completed   PNA vac Low Risk Adult  Completed   HPV VACCINES  Aged Out    Health Maintenance  Health Maintenance Due  Topic Date Due   Zoster Vaccines- Shingrix (1 of 2) Never done   TETANUS/TDAP  06/13/2020   COVID-19 Vaccine (3 - Pfizer risk series) 08/19/2020   INFLUENZA VACCINE  06/16/2021    Colorectal cancer screening: Type of screening: Colonoscopy. Completed 04/29/2020. Repeat every 10 years  Mammogram status: No longer required due to AGE.  Bone Density status: Completed 09/06/2019. Results reflect: Bone density results: NORMAL. Repeat every 0 years.  Lung Cancer Screening: (Low Dose CT Chest recommended if Age 75-80years, 30 pack-year currently smoking OR have quit w/in 15years.) does qualify.   Lung Cancer Screening Referral: NO  Additional Screening:  Hepatitis C Screening: does qualify; Completed 01/07/2016  Vision Screening: Recommended annual ophthalmology exams for early detection of glaucoma and other disorders of the eye. Is the patient up to date with their annual eye exam?  Yes  Who is the provider or what is the name of the office in which the patient attends annual eye exams? Dr. PChong SicilianIf pt is not established with a provider, would they like to be referred to a provider to establish care? No .   Dental Screening: Recommended annual dental exams for proper oral hygiene  Community Resource Referral / Chronic Care Management: CRR required  this visit?  No   CCM required this visit?  No      Plan:     I have personally reviewed and noted the following in the patient's chart:   Medical and social history Use of alcohol, tobacco or illicit drugs  Current medications and supplements including opioid prescriptions.  Functional ability and status Nutritional status Physical activity Advanced directives List of other physicians Hospitalizations, surgeries, and ER visits in previous 12 months Vitals Screenings to include cognitive, depression, and falls Referrals and appointments  In addition, I have reviewed and discussed with patient certain preventive protocols, quality metrics, and best practice recommendations. A written personalized care plan for preventive services as well as general preventive health recommendations were provided to patient.     [facility]gar Frisk [facility]  07/18/2021   Nurse Notes: Non Face to Face 30 minute visit Encounter    Tammy Suarez , Thank you for taking time to come for your Medicare Wellness Visit. I appreciate your ongoing commitment to your  health goals. Please review the following plan we discussed and let me know if I can assist you in the future.   These are the goals we discussed:  Goals      Exercise 3x per week (30 min per time)     Recommend starting a routine exercise program at least 3 days a week for 30-45 minutes at a time as tolerated.        Prevent falls     Patient has a history of previous falls. Encouraged to take her time, keep walkways free of clutter      Quit smoking / using tobacco        This is a list of the screening recommended for you and due dates:  Health Maintenance  Topic Date Due   Zoster (Shingles) Vaccine (1 of 2) Never done   Tetanus Vaccine  06/13/2020   COVID-19 Vaccine (3 - Pfizer risk series) 08/19/2020   Flu Shot  06/16/2021   Hemoglobin A1C  10/29/2021   Eye exam for diabetics  05/13/2022   Complete foot exam   05/19/2022   Colon  Cancer Screening  04/29/2030   DEXA scan (bone density measurement)  Completed   Hepatitis C Screening: USPSTF Recommendation to screen - Ages 53-79 yo.  Completed   Pneumonia vaccines  Completed   HPV Vaccine  Aged Out   Patient has been advised that PCP office will mail AVS.

## 2021-07-19 ENCOUNTER — Other Ambulatory Visit: Payer: Self-pay | Admitting: Family Medicine

## 2021-07-29 ENCOUNTER — Other Ambulatory Visit: Payer: Self-pay | Admitting: Family Medicine

## 2021-08-12 ENCOUNTER — Other Ambulatory Visit: Payer: Self-pay | Admitting: Cardiology

## 2021-08-28 ENCOUNTER — Other Ambulatory Visit: Payer: Self-pay | Admitting: Family Medicine

## 2021-08-28 ENCOUNTER — Ambulatory Visit: Payer: Medicare HMO | Admitting: Family Medicine

## 2021-09-10 ENCOUNTER — Ambulatory Visit: Payer: Medicare HMO | Admitting: Family Medicine

## 2021-09-15 ENCOUNTER — Other Ambulatory Visit: Payer: Self-pay | Admitting: Family Medicine

## 2021-09-30 DIAGNOSIS — M542 Cervicalgia: Secondary | ICD-10-CM | POA: Diagnosis not present

## 2021-09-30 DIAGNOSIS — M25519 Pain in unspecified shoulder: Secondary | ICD-10-CM | POA: Diagnosis not present

## 2021-09-30 DIAGNOSIS — G894 Chronic pain syndrome: Secondary | ICD-10-CM | POA: Diagnosis not present

## 2021-09-30 DIAGNOSIS — M62838 Other muscle spasm: Secondary | ICD-10-CM | POA: Diagnosis not present

## 2021-09-30 DIAGNOSIS — Z79891 Long term (current) use of opiate analgesic: Secondary | ICD-10-CM | POA: Diagnosis not present

## 2021-10-21 ENCOUNTER — Other Ambulatory Visit: Payer: Self-pay | Admitting: Family Medicine

## 2021-11-05 DIAGNOSIS — M25519 Pain in unspecified shoulder: Secondary | ICD-10-CM | POA: Diagnosis not present

## 2021-11-05 DIAGNOSIS — M542 Cervicalgia: Secondary | ICD-10-CM | POA: Diagnosis not present

## 2021-11-05 DIAGNOSIS — M62838 Other muscle spasm: Secondary | ICD-10-CM | POA: Diagnosis not present

## 2021-11-05 DIAGNOSIS — G894 Chronic pain syndrome: Secondary | ICD-10-CM | POA: Diagnosis not present

## 2021-11-05 DIAGNOSIS — Z79891 Long term (current) use of opiate analgesic: Secondary | ICD-10-CM | POA: Diagnosis not present

## 2021-11-07 ENCOUNTER — Encounter: Payer: Self-pay | Admitting: Family Medicine

## 2021-11-07 ENCOUNTER — Other Ambulatory Visit: Payer: Self-pay

## 2021-11-07 ENCOUNTER — Ambulatory Visit (INDEPENDENT_AMBULATORY_CARE_PROVIDER_SITE_OTHER): Payer: Medicare HMO | Admitting: Family Medicine

## 2021-11-07 ENCOUNTER — Encounter (INDEPENDENT_AMBULATORY_CARE_PROVIDER_SITE_OTHER): Payer: Self-pay

## 2021-11-07 DIAGNOSIS — E1121 Type 2 diabetes mellitus with diabetic nephropathy: Secondary | ICD-10-CM

## 2021-11-07 DIAGNOSIS — R296 Repeated falls: Secondary | ICD-10-CM | POA: Diagnosis not present

## 2021-11-07 DIAGNOSIS — Z1231 Encounter for screening mammogram for malignant neoplasm of breast: Secondary | ICD-10-CM

## 2021-11-07 DIAGNOSIS — E669 Obesity, unspecified: Secondary | ICD-10-CM

## 2021-11-07 DIAGNOSIS — F17218 Nicotine dependence, cigarettes, with other nicotine-induced disorders: Secondary | ICD-10-CM

## 2021-11-07 DIAGNOSIS — I10 Essential (primary) hypertension: Secondary | ICD-10-CM | POA: Diagnosis not present

## 2021-11-07 DIAGNOSIS — R32 Unspecified urinary incontinence: Secondary | ICD-10-CM | POA: Diagnosis not present

## 2021-11-07 DIAGNOSIS — E785 Hyperlipidemia, unspecified: Secondary | ICD-10-CM

## 2021-11-07 DIAGNOSIS — E66811 Obesity, class 1: Secondary | ICD-10-CM

## 2021-11-07 MED ORDER — METOPROLOL SUCCINATE ER 50 MG PO TB24: 50.0000 mg | ORAL_TABLET | Freq: Every day | ORAL | 3 refills | Status: DC

## 2021-11-07 MED ORDER — OXYBUTYNIN CHLORIDE 5 MG PO TABS: ORAL_TABLET | ORAL | 3 refills | Status: DC

## 2021-11-07 NOTE — Progress Notes (Signed)
Virtual Visit via Telephone Note  I connected with Tammy Suarez on 11/07/21 at 10:20 AM EST by telephone and verified that I am speaking with the correct person using two identifiers.  Location: Patient: home Provider: office   I discussed the limitations, risks, security and privacy concerns of performing an evaluation and management service by telephone and the availability of in person appointments. I also discussed with the patient that there may be a patient responsible charge related to this service. The patient expressed understanding and agreed to proceed.   History of Present Illness: F/U chronic problems and address any new or current concerns. Review and update medications and allergies. Review recent lab and radiologic data . Update routine health maintainace. Review an encourage improved health habits to include nutrition, exercise and  sleep . Transportation difficulties, car breaking down  has kept her from scheduled visits and tests, states she is working on this    Observations/Objective: There were no vitals taken for this visit. Good communication with no confusion and intact memory. Alert and oriented x 3 No signs of respiratory distress during speech   Assessment and Plan: Essential hypertension, benign DASH diet and commitment to daily physical activity for a minimum of 30 minutes discussed and encouraged, as a part of hypertension management. The importance of attaining a healthy weight is also discussed.  BP/Weight 05/15/2021 05/13/2021 03/25/2021 03/12/2021 02/03/2021 01/29/2021 03/21/3975  Systolic BP 734 193 790 240 973 532 992  Diastolic BP 81 80 80 70 81 74 69  Wt. (Lbs) 193 193 198.8 194 196 195.8 197  BMI 32.12 32.12 33.08 32.28 32.62 32.58 32.78       DM (diabetes mellitus), type 2 with renal complications ([place]) [name] is reminded of the importance of commitment to daily physical activity for 30 minutes or more, as able and the need to limit  carbohydrate intake to 30 to 60 grams per meal to help with blood sugar control.   The need to take medication as prescribed, test blood sugar as directed, and to call between visits if there is a concern that blood sugar is uncontrolled is also discussed.   [name] is reminded of the importance of daily foot exam, annual eye examination, and good blood sugar, blood pressure and cholesterol control.  Diabetic Labs Latest Ref Rng & Units 05/13/2021 04/29/2021 01/29/2021 10/04/2020 04/25/2020  HbA1c 4.8 - 5.6 % - 6.7(H) 8.1(H) 7.1(H) -  Microalbumin mg/dL - - - - -  Micro/Creat Ratio 0 - 29 mg/g creat 206(H) - - - -  Chol 100 - 199 mg/dL - 73(L) 77(L) 81(L) -  HDL >39 mg/dL - 13(L) 15(L) 16(L) -  Calc LDL 0 - 99 mg/dL - 39 37 34 -  Triglycerides 0 - 149 mg/dL - 108 145 185(H) -  Creatinine 0.57 - 1.00 mg/dL - 1.64(H) - 1.65(H) 1.55(H)   BP/Weight 05/15/2021 05/13/2021 03/25/2021 03/12/2021 02/03/2021 01/29/2021 03/11/8340  Systolic BP 962 229 798 921 194 174 081  Diastolic BP 81 80 80 70 81 74 69  Wt. (Lbs) 193 193 198.8 194 196 195.8 197  BMI 32.12 32.12 33.08 32.28 32.62 32.58 32.78   Foot/eye exam completion dates Latest Ref Rng & Units 05/13/2021 01/23/2020  Eye Exam No Retinopathy No Retinopathy No Retinopathy  Foot exam Order - - -  Foot Form Completion - - -      Updated lab needed at/ before next visit.   Nicotine dependence Asked:confirms currently smokes cigarettes approx 4/day Assess:  Unwilling to set a quit date, but is cutting back and wants to quit Advise: needs to QUIT to reduce risk of cancer, cardio and cerebrovascular disease Assist: counseled for 5 minutes and literature provided Arrange: follow up in 2 to 4 months   Obesity (BMI 30.0-34.9)  Patient re-educated about  the importance of commitment to a  minimum of 150 minutes of exercise per week as able.  The importance of healthy food choices with portion control discussed, as well as eating regularly and within a  12 hour window most days. The need to choose "clean , green" food 50 to 75% of the time is discussed, as well as to make water the primary drink and set a goal of 64 ounces water daily.    Weight /BMI 05/15/2021 05/13/2021 03/25/2021  WEIGHT 193 lb 193 lb 198 lb 12.8 oz  HEIGHT - 5\' 5"  5\' 5"   BMI 32.12 kg/m2 32.12 kg/m2 33.08 kg/m2      Recurrent falls Home safety reviewed  Urinary incontinence Med precribed  Hyperlipidemia with target LDL less than 70 Hyperlipidemia:Low fat diet discussed and encouraged.   Lipid Panel  Lab Results  Component Value Date   CHOL 73 (L) 04/29/2021   HDL 13 (L) 04/29/2021   LDLCALC 39 04/29/2021   TRIG 108 04/29/2021   CHOLHDL 5.6 (H) 04/29/2021     Updated lab needed at/ before next visit.    Follow Up Instructions:    I discussed the assessment and treatment plan with the patient. The patient was provided an opportunity to ask questions and all were answered. The patient agreed with the plan and demonstrated an understanding of the instructions.   The patient was advised to call back or seek an in-person evaluation if the symptoms worsen or if the condition fails to improve as anticipated.  I provided 21 minutes of non-face-to-face time during this encounter.   Tula Nakayama, MD

## 2021-11-07 NOTE — Patient Instructions (Addendum)
F/u in office 3rd week in Jan , call if you need me sooner  Fasting lipid, cmp and EGr, tSH first or 2nd week in Jan, also flu vaccine on the day she comes for labs please  Please get covid booster and shingrix vaccines at your pharmacy as soon as possible  Continue to  work on quitting smoking, now at 4/ day, which is an improvement, keep it up!   Please schedule Feb mammogram at checkout  Thanks for choosing [facility], we consider it a privelige to serve you.  Careful not to fall!  Thanks for choosing [facility], we consider it a privelige to serve you.

## 2021-11-11 NOTE — Assessment & Plan Note (Signed)
Home safety reviewed 

## 2021-11-11 NOTE — Assessment & Plan Note (Signed)
Asked:confirms currently smokes cigarettes approx 4/day Assess: Unwilling to set a quit date, but is cutting back and wants to quit Advise: needs to QUIT to reduce risk of cancer, cardio and cerebrovascular disease Assist: counseled for 5 minutes and literature provided Arrange: follow up in 2 to 4 months

## 2021-11-11 NOTE — Assessment & Plan Note (Signed)
Ms. Veazey is reminded of the importance of commitment to daily physical activity for 30 minutes or more, as able and the need to limit carbohydrate intake to 30 to 60 grams per meal to help with blood sugar control.   The need to take medication as prescribed, test blood sugar as directed, and to call between visits if there is a concern that blood sugar is uncontrolled is also discussed.   Ms. Camino is reminded of the importance of daily foot exam, annual eye examination, and good blood sugar, blood pressure and cholesterol control.  Diabetic Labs Latest Ref Rng & Units 05/13/2021 04/29/2021 01/29/2021 10/04/2020 04/25/2020  HbA1c 4.8 - 5.6 % - 6.7(H) 8.1(H) 7.1(H) -  Microalbumin mg/dL - - - - -  Micro/Creat Ratio 0 - 29 mg/g creat 206(H) - - - -  Chol 100 - 199 mg/dL - 73(L) 77(L) 81(L) -  HDL >39 mg/dL - 13(L) 15(L) 16(L) -  Calc LDL 0 - 99 mg/dL - 39 37 34 -  Triglycerides 0 - 149 mg/dL - 108 145 185(H) -  Creatinine 0.57 - 1.00 mg/dL - 1.64(H) - 1.65(H) 1.55(H)   BP/Weight 05/15/2021 05/13/2021 03/25/2021 03/12/2021 02/03/2021 01/29/2021 12/04/1476  Systolic BP 295 621 308 657 846 962 952  Diastolic BP 81 80 80 70 81 74 69  Wt. (Lbs) 193 193 198.8 194 196 195.8 197  BMI 32.12 32.12 33.08 32.28 32.62 32.58 32.78   Foot/eye exam completion dates Latest Ref Rng & Units 05/13/2021 01/23/2020  Eye Exam No Retinopathy No Retinopathy No Retinopathy  Foot exam Order - - -  Foot Form Completion - - -      Updated lab needed at/ before next visit.

## 2021-11-11 NOTE — Assessment & Plan Note (Signed)
DASH diet and commitment to daily physical activity for a minimum of 30 minutes discussed and encouraged, as a part of hypertension management. The importance of attaining a healthy weight is also discussed.  BP/Weight 05/15/2021 05/13/2021 03/25/2021 03/12/2021 02/03/2021 01/29/2021 1/58/6825  Systolic BP 749 355 217 471 595 396 728  Diastolic BP 81 80 80 70 81 74 69  Wt. (Lbs) 193 193 198.8 194 196 195.8 197  BMI 32.12 32.12 33.08 32.28 32.62 32.58 32.78

## 2021-11-11 NOTE — Assessment & Plan Note (Signed)
Med precribed

## 2021-11-11 NOTE — Assessment & Plan Note (Signed)
Hyperlipidemia:Low fat diet discussed and encouraged.   Lipid Panel  Lab Results  Component Value Date   CHOL 73 (L) 04/29/2021   HDL 13 (L) 04/29/2021   LDLCALC 39 04/29/2021   TRIG 108 04/29/2021   CHOLHDL 5.6 (H) 04/29/2021     Updated lab needed at/ before next visit.

## 2021-11-11 NOTE — Assessment & Plan Note (Signed)
°  Patient re-educated about  the importance of commitment to a  minimum of 150 minutes of exercise per week as able.  The importance of healthy food choices with portion control discussed, as well as eating regularly and within a 12 hour window most days. The need to choose "clean , green" food 50 to 75% of the time is discussed, as well as to make water the primary drink and set a goal of 64 ounces water daily.    Weight /BMI 05/15/2021 05/13/2021 03/25/2021  WEIGHT 193 lb 193 lb 198 lb 12.8 oz  HEIGHT - 5\' 5"  5\' 5"   BMI 32.12 kg/m2 32.12 kg/m2 33.08 kg/m2

## 2021-11-27 ENCOUNTER — Ambulatory Visit (HOSPITAL_COMMUNITY): Payer: Medicare HMO

## 2021-12-14 ENCOUNTER — Other Ambulatory Visit: Payer: Self-pay | Admitting: Family Medicine

## 2021-12-17 ENCOUNTER — Ambulatory Visit: Payer: Medicare HMO | Admitting: Family Medicine

## 2021-12-22 ENCOUNTER — Other Ambulatory Visit: Payer: Self-pay

## 2021-12-22 ENCOUNTER — Ambulatory Visit (HOSPITAL_COMMUNITY)
Admission: RE | Admit: 2021-12-22 | Discharge: 2021-12-22 | Disposition: A | Discharge status: Home / Self Care | Payer: Medicare HMO | Source: Ambulatory Visit | Attending: Family Medicine | Admitting: Family Medicine

## 2021-12-22 DIAGNOSIS — Z1231 Encounter for screening mammogram for malignant neoplasm of breast: Secondary | ICD-10-CM | POA: Insufficient documentation

## 2021-12-23 ENCOUNTER — Other Ambulatory Visit (HOSPITAL_COMMUNITY): Payer: Self-pay | Admitting: Family Medicine

## 2021-12-23 DIAGNOSIS — Z79899 Other long term (current) drug therapy: Secondary | ICD-10-CM | POA: Diagnosis not present

## 2021-12-23 DIAGNOSIS — R921 Mammographic calcification found on diagnostic imaging of breast: Secondary | ICD-10-CM

## 2021-12-23 DIAGNOSIS — G894 Chronic pain syndrome: Secondary | ICD-10-CM | POA: Diagnosis not present

## 2021-12-23 DIAGNOSIS — M62838 Other muscle spasm: Secondary | ICD-10-CM | POA: Diagnosis not present

## 2021-12-23 DIAGNOSIS — M25519 Pain in unspecified shoulder: Secondary | ICD-10-CM | POA: Diagnosis not present

## 2021-12-23 DIAGNOSIS — Z79891 Long term (current) use of opiate analgesic: Secondary | ICD-10-CM | POA: Diagnosis not present

## 2021-12-23 DIAGNOSIS — M542 Cervicalgia: Secondary | ICD-10-CM | POA: Diagnosis not present

## 2021-12-23 DIAGNOSIS — R928 Other abnormal and inconclusive findings on diagnostic imaging of breast: Secondary | ICD-10-CM

## 2021-12-30 ENCOUNTER — Ambulatory Visit (INDEPENDENT_AMBULATORY_CARE_PROVIDER_SITE_OTHER): Payer: Medicare HMO | Admitting: Family Medicine

## 2021-12-30 ENCOUNTER — Encounter: Payer: Self-pay | Admitting: Family Medicine

## 2021-12-30 ENCOUNTER — Other Ambulatory Visit: Payer: Self-pay

## 2021-12-30 ENCOUNTER — Encounter (INDEPENDENT_AMBULATORY_CARE_PROVIDER_SITE_OTHER): Payer: Self-pay

## 2021-12-30 VITALS — BP 125/72 | HR 53 | Ht 65.0 in | Wt 189.1 lb

## 2021-12-30 DIAGNOSIS — Z122 Encounter for screening for malignant neoplasm of respiratory organs: Secondary | ICD-10-CM | POA: Diagnosis not present

## 2021-12-30 DIAGNOSIS — F17210 Nicotine dependence, cigarettes, uncomplicated: Secondary | ICD-10-CM

## 2021-12-30 DIAGNOSIS — M48061 Spinal stenosis, lumbar region without neurogenic claudication: Secondary | ICD-10-CM | POA: Diagnosis not present

## 2021-12-30 DIAGNOSIS — Z23 Encounter for immunization: Secondary | ICD-10-CM

## 2021-12-30 DIAGNOSIS — F17218 Nicotine dependence, cigarettes, with other nicotine-induced disorders: Secondary | ICD-10-CM

## 2021-12-30 DIAGNOSIS — E1121 Type 2 diabetes mellitus with diabetic nephropathy: Secondary | ICD-10-CM

## 2021-12-30 DIAGNOSIS — I10 Essential (primary) hypertension: Secondary | ICD-10-CM | POA: Diagnosis not present

## 2021-12-30 DIAGNOSIS — M5416 Radiculopathy, lumbar region: Secondary | ICD-10-CM

## 2021-12-30 DIAGNOSIS — N183 Chronic kidney disease, stage 3 unspecified: Secondary | ICD-10-CM | POA: Diagnosis not present

## 2021-12-30 DIAGNOSIS — E785 Hyperlipidemia, unspecified: Secondary | ICD-10-CM | POA: Diagnosis not present

## 2021-12-30 DIAGNOSIS — N184 Chronic kidney disease, stage 4 (severe): Secondary | ICD-10-CM

## 2021-12-30 DIAGNOSIS — E038 Other specified hypothyroidism: Secondary | ICD-10-CM

## 2021-12-30 DIAGNOSIS — E669 Obesity, unspecified: Secondary | ICD-10-CM

## 2021-12-30 DIAGNOSIS — E559 Vitamin D deficiency, unspecified: Secondary | ICD-10-CM | POA: Diagnosis not present

## 2021-12-30 DIAGNOSIS — E66811 Obesity, class 1: Secondary | ICD-10-CM

## 2021-12-30 NOTE — Patient Instructions (Addendum)
Annual exam June 29 or after, call if you end me sooner  Flu vaccine today  Check pharmacy for shingrix , you need this  Please schedule chest scan at checkout  Labs today, CBC, lipid, cmp an deGFR, hBA1c, TSH and vit D  Need to quit smoking, now down to 3/day  Pls let me know when you decide on getting pT for weak legs and unsteady gait  Congrats on new Grand baby, treaure:-)  Thanks for choosing [facility], we consider it a privelige to serve you.

## 2021-12-31 LAB — CMP14+EGFR
ALT: 12 IU/L (ref 0–32)
AST: 33 IU/L (ref 0–40)
Albumin/Globulin Ratio: 1.3 (ref 1.2–2.2)
Albumin: 3.6 g/dL — ABNORMAL LOW (ref 3.7–4.7)
Alkaline Phosphatase: 100 IU/L (ref 44–121)
BUN/Creatinine Ratio: 20 (ref 12–28)
BUN: 37 mg/dL — ABNORMAL HIGH (ref 8–27)
Bilirubin Total: 0.4 mg/dL (ref 0.0–1.2)
CO2: 25 mmol/L (ref 20–29)
Calcium: 9.6 mg/dL (ref 8.7–10.3)
Chloride: 101 mmol/L (ref 96–106)
Creatinine, Ser: 1.84 mg/dL — ABNORMAL HIGH (ref 0.57–1.00)
Globulin, Total: 2.7 g/dL (ref 1.5–4.5)
Glucose: 114 mg/dL — ABNORMAL HIGH (ref 70–99)
Potassium: 5.3 mmol/L — ABNORMAL HIGH (ref 3.5–5.2)
Sodium: 137 mmol/L (ref 134–144)
Total Protein: 6.3 g/dL (ref 6.0–8.5)
eGFR: 28 mL/min/{1.73_m2} — ABNORMAL LOW (ref >59)

## 2021-12-31 LAB — VITAMIN D 25 HYDROXY (VIT D DEFICIENCY, FRACTURES): Vit D, 25-Hydroxy: 33.5 ng/mL (ref 30.0–100.0)

## 2021-12-31 LAB — CBC
Hematocrit: 31.7 % — ABNORMAL LOW (ref 34.0–46.6)
Hemoglobin: 10.8 g/dL — ABNORMAL LOW (ref 11.1–15.9)
MCH: 34.2 pg — ABNORMAL HIGH (ref 26.6–33.0)
MCHC: 34.1 g/dL (ref 31.5–35.7)
MCV: 100 fL — ABNORMAL HIGH (ref 79–97)
Platelets: 201 10*3/uL (ref 150–450)
RBC: 3.16 x10E6/uL — ABNORMAL LOW (ref 3.77–5.28)
RDW: 10.9 % — ABNORMAL LOW (ref 11.7–15.4)
WBC: 4.8 10*3/uL (ref 3.4–10.8)

## 2021-12-31 LAB — LIPID PANEL
Chol/HDL Ratio: 5.1 ratio — ABNORMAL HIGH (ref 0.0–4.4)
Cholesterol, Total: 72 mg/dL — ABNORMAL LOW (ref 100–199)
HDL: 14 mg/dL — ABNORMAL LOW (ref >39)
LDL Chol Calc (NIH): 38 mg/dL (ref 0–99)
Triglycerides: 101 mg/dL (ref 0–149)
VLDL Cholesterol Cal: 20 mg/dL (ref 5–40)

## 2021-12-31 LAB — HEMOGLOBIN A1C
Est. average glucose Bld gHb Est-mCnc: 140 mg/dL
Hgb A1c MFr Bld: 6.5 % — ABNORMAL HIGH (ref 4.8–5.6)

## 2021-12-31 LAB — TSH: TSH: 2.23 u[IU]/mL (ref 0.450–4.500)

## 2022-01-04 ENCOUNTER — Encounter: Payer: Self-pay | Admitting: Family Medicine

## 2022-01-04 NOTE — Assessment & Plan Note (Signed)
Controlled, no change in medication  

## 2022-01-04 NOTE — Assessment & Plan Note (Signed)
Needs to follow with Nephrology will ensure this occurs as able, deteriorating

## 2022-01-04 NOTE — Assessment & Plan Note (Signed)
°  Patient re-educated about  the importance of commitment to a  minimum of 150 minutes of exercise per week as able.  The importance of healthy food choices with portion control discussed, as well as eating regularly and within a 12 hour window most days. The need to choose "clean , green" food 50 to 75% of the time is discussed, as well as to make water the primary drink and set a goal of 64 ounces water daily.    Weight /BMI 12/30/2021 05/15/2021 05/13/2021  WEIGHT 189 lb 1.3 oz 193 lb 193 lb  HEIGHT 5\' 5"  - 5\' 5"   BMI 31.46 kg/m2 32.12 kg/m2 32.12 kg/m2

## 2022-01-04 NOTE — Assessment & Plan Note (Signed)
Controlled, no change in medication Tammy Suarez is reminded of the importance of commitment to daily physical activity for 30 minutes or more, as able and the need to limit carbohydrate intake to 30 to 60 grams per meal to help with blood sugar control.   The need to take medication as prescribed, test blood sugar as directed, and to call between visits if there is a concern that blood sugar is uncontrolled is also discussed.   Tammy Suarez is reminded of the importance of daily foot exam, annual eye examination, and good blood sugar, blood pressure and cholesterol control.  Diabetic Labs Latest Ref Rng & Units 12/30/2021 05/13/2021 04/29/2021 01/29/2021 10/04/2020  HbA1c 4.8 - 5.6 % 6.5(H) - 6.7(H) 8.1(H) 7.1(H)  Microalbumin mg/dL - - - - -  Micro/Creat Ratio 0 - 29 mg/g creat - 206(H) - - -  Chol 100 - 199 mg/dL 72(L) - 73(L) 77(L) 81(L)  HDL >39 mg/dL 14(L) - 13(L) 15(L) 16(L)  Calc LDL 0 - 99 mg/dL 38 - 39 37 34  Triglycerides 0 - 149 mg/dL 101 - 108 145 185(H)  Creatinine 0.57 - 1.00 mg/dL 1.84(H) - 1.64(H) - 1.65(H)   BP/Weight 12/30/2021 05/15/2021 05/13/2021 03/25/2021 03/12/2021 02/03/2021 07/14/5620  Systolic BP 308 657 846 962 952 841 324  Diastolic BP 72 81 80 80 70 81 74  Wt. (Lbs) 189.08 193 193 198.8 194 196 195.8  BMI 31.46 32.12 32.12 33.08 32.28 32.62 32.58   Foot/eye exam completion dates Latest Ref Rng & Units 05/13/2021 01/23/2020  Eye Exam No Retinopathy No Retinopathy No Retinopathy  Foot exam Order - - -  Foot Form Completion - - -

## 2022-01-04 NOTE — Assessment & Plan Note (Signed)
DASH diet and commitment to daily physical activity for a minimum of 30 minutes discussed and encouraged, as a part of hypertension management. The importance of attaining a healthy weight is also discussed.  BP/Weight 12/30/2021 05/15/2021 05/13/2021 03/25/2021 03/12/2021 02/03/2021 1/56/1537  Systolic BP 943 276 147 092 957 473 403  Diastolic BP 72 81 80 80 70 81 74  Wt. (Lbs) 189.08 193 193 198.8 194 196 195.8  BMI 31.46 32.12 32.12 33.08 32.28 32.62 32.58     Controlled, no change in medication

## 2022-01-04 NOTE — Progress Notes (Signed)
Tammy Suarez     MRN: [id number]      DOB: 1945-11-18   HPI Tammy Suarez is here for follow up and re-evaluation of chronic medical conditions, medication management and review of any available recent lab and radiology data.  Preventive health is updated, specifically  Cancer screening and Immunization.   Questions or concerns regarding consultations or procedures which the PT has had in the interim are  addressed. The PT denies any adverse reactions to current medications since the last visit.  C/olower extremity weakness an unsteady gait, denies any recent falls, resisting PT at this time Denies polyuria, polydipsia, blurred vision , or hypoglycemic episodes.   ROS Denies recent fever or chills. Denies sinus pressure, nasal congestion, ear pain or sore throat. Denies chest congestion, productive cough or wheezing. Denies chest pains, palpitations and leg swelling Denies abdominal pain, nausea, vomiting,diarrhea or constipation.   Denies dysuria, frequency, hesitancy or incontinence. Denies uncontrolled depression, anxiety or insomnia. Denies skin break down or rash.   PE  BP 125/72    Pulse (!) 53    Ht 5\' 5"  (1.651 m)    Wt 189 lb 1.3 oz (85.8 kg)    SpO2 96%    BMI 31.46 kg/m   Patient alert and oriented and in no cardiopulmonary distress.  HEENT: No facial asymmetry, EOMI,     Neck supple .  Chest: Clear to auscultation bilaterally.Decreased air entry  CVS: S1, S2 , no S3.Regular rate.  ABD: Soft non tender.   Ext: No edema  MS: decreased ROM spine, shoulders, hips and knees.  Skin: Intact, no ulcerations or rash noted.  Psych: Good eye contact, normal affect. Memory intact not anxious or depressed appearing.  CNS: CN 2-12 intact, power,  normal throughout.no focal deficits noted.   Assessment & Plan  Hypothyroidism Controlled, no change in medication   Essential hypertension, benign DASH diet and commitment to daily physical activity for a minimum of  30 minutes discussed and encouraged, as a part of hypertension management. The importance of attaining a healthy weight is also discussed.  BP/Weight 12/30/2021 05/15/2021 05/13/2021 03/25/2021 03/12/2021 02/03/2021 6/72/0947  Systolic BP 096 283 662 947 654 650 354  Diastolic BP 72 81 80 80 70 81 74  Wt. (Lbs) 189.08 193 193 198.8 194 196 195.8  BMI 31.46 32.12 32.12 33.08 32.28 32.62 32.58     Controlled, no change in medication   DM (diabetes mellitus), type 2 with renal complications (HCC) Controlled, no change in medication Tammy Suarez is reminded of the importance of commitment to daily physical activity for 30 minutes or more, as able and the need to limit carbohydrate intake to 30 to 60 grams per meal to help with blood sugar control.   The need to take medication as prescribed, test blood sugar as directed, and to call between visits if there is a concern that blood sugar is uncontrolled is also discussed.   Tammy Suarez is reminded of the importance of daily foot exam, annual eye examination, and good blood sugar, blood pressure and cholesterol control.  Diabetic Labs Latest Ref Rng & Units 12/30/2021 05/13/2021 04/29/2021 01/29/2021 10/04/2020  HbA1c 4.8 - 5.6 % 6.5(H) - 6.7(H) 8.1(H) 7.1(H)  Microalbumin mg/dL - - - - -  Micro/Creat Ratio 0 - 29 mg/g creat - 206(H) - - -  Chol 100 - 199 mg/dL 72(L) - 73(L) 77(L) 81(L)  HDL >39 mg/dL 14(L) - 13(L) 15(L) 16(L)  Calc LDL 0 - 99 mg/dL  38 - 39 37 34  Triglycerides 0 - 149 mg/dL 101 - 108 145 185(H)  Creatinine 0.57 - 1.00 mg/dL 1.84(H) - 1.64(H) - 1.65(H)   BP/Weight 12/30/2021 05/15/2021 05/13/2021 03/25/2021 03/12/2021 02/03/2021 04/12/7823  Systolic BP 235 361 443 154 008 676 195  Diastolic BP 72 81 80 80 70 81 74  Wt. (Lbs) 189.08 193 193 198.8 194 196 195.8  BMI 31.46 32.12 32.12 33.08 32.28 32.62 32.58   Foot/eye exam completion dates Latest Ref Rng & Units 05/13/2021 01/23/2020  Eye Exam No Retinopathy No Retinopathy No Retinopathy   Foot exam Order - - -  Foot Form Completion - - -        CKD (chronic kidney disease) stage 3, GFR 30-59 ml/min Needs to follow with Nephrology will ensure this occurs as able, deteriorating  Spinal stenosis of lumbar region with radiculopathy Pain mangement through pain clinic  Nicotine dependence Asked:confirms currently smokes cigarettes Assess: Unwilling to set a quit date, but is cutting back Advise: needs to QUIT to reduce risk of cancer, cardio and cerebrovascular disease Assist: counseled for 5 minutes and literature provided Arrange: follow up in 2 to 4 months   Obesity (BMI 30.0-34.9)  Patient re-educated about  the importance of commitment to a  minimum of 150 minutes of exercise per week as able.  The importance of healthy food choices with portion control discussed, as well as eating regularly and within a 12 hour window most days. The need to choose "clean , green" food 50 to 75% of the time is discussed, as well as to make water the primary drink and set a goal of 64 ounces water daily.    Weight /BMI 12/30/2021 05/15/2021 05/13/2021  WEIGHT 189 lb 1.3 oz 193 lb 193 lb  HEIGHT 5\' 5"  - 5\' 5"   BMI 31.46 kg/m2 32.12 kg/m2 32.12 kg/m2

## 2022-01-04 NOTE — Assessment & Plan Note (Signed)
Pain mangement through pain clinic

## 2022-01-04 NOTE — Assessment & Plan Note (Signed)
Asked:confirms currently smokes cigarettes °Assess: Unwilling to set a quit date, but is cutting back °Advise: needs to QUIT to reduce risk of cancer, cardio and cerebrovascular disease °Assist: counseled for 5 minutes and literature provided °Arrange: follow up in 2 to 4 months ° °

## 2022-01-05 ENCOUNTER — Other Ambulatory Visit: Payer: Self-pay

## 2022-01-05 DIAGNOSIS — D539 Nutritional anemia, unspecified: Secondary | ICD-10-CM

## 2022-01-05 DIAGNOSIS — E559 Vitamin D deficiency, unspecified: Secondary | ICD-10-CM

## 2022-01-08 ENCOUNTER — Encounter (HOSPITAL_COMMUNITY): Payer: Medicare HMO

## 2022-01-08 ENCOUNTER — Ambulatory Visit (HOSPITAL_COMMUNITY): Admission: RE | Admit: 2022-01-08 | Payer: Medicare HMO | Source: Ambulatory Visit

## 2022-01-08 ENCOUNTER — Encounter (HOSPITAL_COMMUNITY): Payer: Self-pay

## 2022-01-08 ENCOUNTER — Other Ambulatory Visit (HOSPITAL_COMMUNITY): Payer: Self-pay | Admitting: Family Medicine

## 2022-01-08 DIAGNOSIS — R928 Other abnormal and inconclusive findings on diagnostic imaging of breast: Secondary | ICD-10-CM

## 2022-01-13 ENCOUNTER — Other Ambulatory Visit: Payer: Self-pay | Admitting: Family Medicine

## 2022-01-20 DIAGNOSIS — M542 Cervicalgia: Secondary | ICD-10-CM | POA: Diagnosis not present

## 2022-01-20 DIAGNOSIS — M62838 Other muscle spasm: Secondary | ICD-10-CM | POA: Diagnosis not present

## 2022-01-20 DIAGNOSIS — G894 Chronic pain syndrome: Secondary | ICD-10-CM | POA: Diagnosis not present

## 2022-01-25 ENCOUNTER — Other Ambulatory Visit: Payer: Self-pay | Admitting: Family Medicine

## 2022-01-27 ENCOUNTER — Other Ambulatory Visit (HOSPITAL_COMMUNITY): Payer: Medicare HMO

## 2022-01-27 ENCOUNTER — Encounter (HOSPITAL_COMMUNITY): Payer: Medicare HMO

## 2022-01-27 ENCOUNTER — Ambulatory Visit (HOSPITAL_COMMUNITY): Payer: Medicare HMO

## 2022-02-06 ENCOUNTER — Ambulatory Visit (HOSPITAL_COMMUNITY): Payer: Medicare HMO

## 2022-02-10 ENCOUNTER — Ambulatory Visit (HOSPITAL_COMMUNITY)
Admission: RE | Admit: 2022-02-10 | Discharge: 2022-02-10 | Disposition: A | Discharge status: Home / Self Care | Payer: Medicare HMO | Source: Ambulatory Visit | Attending: Family Medicine | Admitting: Family Medicine

## 2022-02-10 ENCOUNTER — Other Ambulatory Visit: Payer: Self-pay

## 2022-02-10 DIAGNOSIS — Z122 Encounter for screening for malignant neoplasm of respiratory organs: Secondary | ICD-10-CM | POA: Diagnosis not present

## 2022-02-10 DIAGNOSIS — I700 Atherosclerosis of aorta: Secondary | ICD-10-CM | POA: Diagnosis not present

## 2022-02-10 DIAGNOSIS — F17210 Nicotine dependence, cigarettes, uncomplicated: Secondary | ICD-10-CM | POA: Insufficient documentation

## 2022-02-10 DIAGNOSIS — J439 Emphysema, unspecified: Secondary | ICD-10-CM | POA: Insufficient documentation

## 2022-02-11 ENCOUNTER — Other Ambulatory Visit: Payer: Self-pay | Admitting: Family Medicine

## 2022-02-11 ENCOUNTER — Telehealth: Payer: Self-pay

## 2022-02-11 ENCOUNTER — Other Ambulatory Visit: Payer: Self-pay | Admitting: Internal Medicine

## 2022-02-11 DIAGNOSIS — E1121 Type 2 diabetes mellitus with diabetic nephropathy: Secondary | ICD-10-CM

## 2022-02-11 NOTE — Telephone Encounter (Signed)
Tammy Suarez called from [facility] [place], [place] ?Medicine Oxybutynin 5 mg is this patient suppose to be on this medicine was written December of 2022.   ? ? ?

## 2022-02-11 NOTE — Telephone Encounter (Signed)
She has been getting oxybutynin from [facility] so advised [facility] to cancel script from dr simpson  ?

## 2022-02-12 ENCOUNTER — Other Ambulatory Visit: Payer: Self-pay | Admitting: Family Medicine

## 2022-02-12 DIAGNOSIS — F418 Other specified anxiety disorders: Secondary | ICD-10-CM

## 2022-02-17 DIAGNOSIS — M25519 Pain in unspecified shoulder: Secondary | ICD-10-CM | POA: Diagnosis not present

## 2022-02-17 DIAGNOSIS — Z79891 Long term (current) use of opiate analgesic: Secondary | ICD-10-CM | POA: Diagnosis not present

## 2022-02-17 DIAGNOSIS — G894 Chronic pain syndrome: Secondary | ICD-10-CM | POA: Diagnosis not present

## 2022-02-17 DIAGNOSIS — M62838 Other muscle spasm: Secondary | ICD-10-CM | POA: Diagnosis not present

## 2022-02-17 DIAGNOSIS — M542 Cervicalgia: Secondary | ICD-10-CM | POA: Diagnosis not present

## 2022-02-24 ENCOUNTER — Other Ambulatory Visit: Payer: Self-pay | Admitting: Family Medicine

## 2022-03-04 ENCOUNTER — Ambulatory Visit (HOSPITAL_COMMUNITY)
Admission: RE | Admit: 2022-03-04 | Discharge: 2022-03-04 | Disposition: A | Discharge status: Home / Self Care | Payer: Medicare HMO | Source: Ambulatory Visit | Attending: Family Medicine | Admitting: Family Medicine

## 2022-03-04 DIAGNOSIS — R928 Other abnormal and inconclusive findings on diagnostic imaging of breast: Secondary | ICD-10-CM | POA: Insufficient documentation

## 2022-03-04 DIAGNOSIS — R921 Mammographic calcification found on diagnostic imaging of breast: Secondary | ICD-10-CM | POA: Insufficient documentation

## 2022-03-05 ENCOUNTER — Other Ambulatory Visit (HOSPITAL_COMMUNITY): Payer: Self-pay | Admitting: Family Medicine

## 2022-03-05 ENCOUNTER — Other Ambulatory Visit: Payer: Self-pay | Admitting: Family Medicine

## 2022-03-05 DIAGNOSIS — R921 Mammographic calcification found on diagnostic imaging of breast: Secondary | ICD-10-CM

## 2022-03-05 DIAGNOSIS — R928 Other abnormal and inconclusive findings on diagnostic imaging of breast: Secondary | ICD-10-CM

## 2022-03-30 ENCOUNTER — Ambulatory Visit
Admission: RE | Admit: 2022-03-30 | Discharge: 2022-03-30 | Disposition: A | Discharge status: Home / Self Care | Payer: Medicare HMO | Source: Ambulatory Visit | Attending: Family Medicine | Admitting: Family Medicine

## 2022-03-30 ENCOUNTER — Other Ambulatory Visit: Payer: Self-pay | Admitting: Radiology

## 2022-03-30 DIAGNOSIS — R928 Other abnormal and inconclusive findings on diagnostic imaging of breast: Secondary | ICD-10-CM

## 2022-03-30 DIAGNOSIS — R921 Mammographic calcification found on diagnostic imaging of breast: Secondary | ICD-10-CM

## 2022-03-30 DIAGNOSIS — N6489 Other specified disorders of breast: Secondary | ICD-10-CM | POA: Diagnosis not present

## 2022-03-31 DIAGNOSIS — Z79891 Long term (current) use of opiate analgesic: Secondary | ICD-10-CM | POA: Diagnosis not present

## 2022-03-31 DIAGNOSIS — Z79899 Other long term (current) drug therapy: Secondary | ICD-10-CM | POA: Diagnosis not present

## 2022-04-02 HISTORY — PX: BREAST BIOPSY: SHX20

## 2022-04-10 ENCOUNTER — Other Ambulatory Visit: Payer: Self-pay | Admitting: Nurse Practitioner

## 2022-04-14 DIAGNOSIS — G894 Chronic pain syndrome: Secondary | ICD-10-CM | POA: Diagnosis not present

## 2022-04-14 DIAGNOSIS — M25519 Pain in unspecified shoulder: Secondary | ICD-10-CM | POA: Diagnosis not present

## 2022-04-14 DIAGNOSIS — M542 Cervicalgia: Secondary | ICD-10-CM | POA: Diagnosis not present

## 2022-04-14 DIAGNOSIS — M62838 Other muscle spasm: Secondary | ICD-10-CM | POA: Diagnosis not present

## 2022-05-15 ENCOUNTER — Encounter: Payer: Medicare HMO | Admitting: Family Medicine

## 2022-05-26 DIAGNOSIS — M542 Cervicalgia: Secondary | ICD-10-CM | POA: Diagnosis not present

## 2022-05-26 DIAGNOSIS — G894 Chronic pain syndrome: Secondary | ICD-10-CM | POA: Diagnosis not present

## 2022-05-26 DIAGNOSIS — M62838 Other muscle spasm: Secondary | ICD-10-CM | POA: Diagnosis not present

## 2022-05-27 ENCOUNTER — Ambulatory Visit (INDEPENDENT_AMBULATORY_CARE_PROVIDER_SITE_OTHER): Payer: Medicare PPO | Admitting: Family Medicine

## 2022-05-27 ENCOUNTER — Emergency Department (HOSPITAL_COMMUNITY)
Admission: EM | Admit: 2022-05-27 | Discharge: 2022-05-27 | Disposition: A | Discharge status: Home / Self Care | Payer: Medicare PPO | Attending: Emergency Medicine | Admitting: Emergency Medicine

## 2022-05-27 ENCOUNTER — Encounter: Payer: Self-pay | Admitting: Family Medicine

## 2022-05-27 ENCOUNTER — Emergency Department (HOSPITAL_COMMUNITY): Payer: Medicare PPO

## 2022-05-27 ENCOUNTER — Encounter (HOSPITAL_COMMUNITY): Payer: Self-pay | Admitting: *Deleted

## 2022-05-27 ENCOUNTER — Other Ambulatory Visit: Payer: Self-pay

## 2022-05-27 VITALS — BP 128/70 | HR 91 | Ht 65.0 in | Wt 185.0 lb

## 2022-05-27 DIAGNOSIS — N183 Chronic kidney disease, stage 3 unspecified: Secondary | ICD-10-CM

## 2022-05-27 DIAGNOSIS — E038 Other specified hypothyroidism: Secondary | ICD-10-CM

## 2022-05-27 DIAGNOSIS — E1122 Type 2 diabetes mellitus with diabetic chronic kidney disease: Secondary | ICD-10-CM | POA: Diagnosis not present

## 2022-05-27 DIAGNOSIS — M2578 Osteophyte, vertebrae: Secondary | ICD-10-CM | POA: Diagnosis not present

## 2022-05-27 DIAGNOSIS — W01198A Fall on same level from slipping, tripping and stumbling with subsequent striking against other object, initial encounter: Secondary | ICD-10-CM | POA: Diagnosis not present

## 2022-05-27 DIAGNOSIS — I129 Hypertensive chronic kidney disease with stage 1 through stage 4 chronic kidney disease, or unspecified chronic kidney disease: Secondary | ICD-10-CM | POA: Insufficient documentation

## 2022-05-27 DIAGNOSIS — E1121 Type 2 diabetes mellitus with diabetic nephropathy: Secondary | ICD-10-CM | POA: Diagnosis not present

## 2022-05-27 DIAGNOSIS — Z043 Encounter for examination and observation following other accident: Secondary | ICD-10-CM | POA: Diagnosis not present

## 2022-05-27 DIAGNOSIS — E039 Hypothyroidism, unspecified: Secondary | ICD-10-CM | POA: Diagnosis not present

## 2022-05-27 DIAGNOSIS — N189 Chronic kidney disease, unspecified: Secondary | ICD-10-CM | POA: Diagnosis not present

## 2022-05-27 DIAGNOSIS — I10 Essential (primary) hypertension: Secondary | ICD-10-CM

## 2022-05-27 DIAGNOSIS — M542 Cervicalgia: Secondary | ICD-10-CM | POA: Insufficient documentation

## 2022-05-27 DIAGNOSIS — R0789 Other chest pain: Secondary | ICD-10-CM | POA: Insufficient documentation

## 2022-05-27 DIAGNOSIS — I25119 Atherosclerotic heart disease of native coronary artery with unspecified angina pectoris: Secondary | ICD-10-CM | POA: Diagnosis not present

## 2022-05-27 DIAGNOSIS — Z0001 Encounter for general adult medical examination with abnormal findings: Secondary | ICD-10-CM | POA: Diagnosis not present

## 2022-05-27 DIAGNOSIS — Z79899 Other long term (current) drug therapy: Secondary | ICD-10-CM | POA: Insufficient documentation

## 2022-05-27 DIAGNOSIS — E538 Deficiency of other specified B group vitamins: Secondary | ICD-10-CM

## 2022-05-27 DIAGNOSIS — S0011XA Contusion of right eyelid and periocular area, initial encounter: Secondary | ICD-10-CM | POA: Diagnosis not present

## 2022-05-27 DIAGNOSIS — I2510 Atherosclerotic heart disease of native coronary artery without angina pectoris: Secondary | ICD-10-CM | POA: Insufficient documentation

## 2022-05-27 DIAGNOSIS — W19XXXA Unspecified fall, initial encounter: Secondary | ICD-10-CM

## 2022-05-27 DIAGNOSIS — D539 Nutritional anemia, unspecified: Secondary | ICD-10-CM

## 2022-05-27 DIAGNOSIS — R079 Chest pain, unspecified: Secondary | ICD-10-CM | POA: Diagnosis not present

## 2022-05-27 DIAGNOSIS — R519 Headache, unspecified: Secondary | ICD-10-CM | POA: Diagnosis not present

## 2022-05-27 DIAGNOSIS — T733XXD Exhaustion due to excessive exertion, subsequent encounter: Secondary | ICD-10-CM

## 2022-05-27 DIAGNOSIS — S0592XA Unspecified injury of left eye and orbit, initial encounter: Secondary | ICD-10-CM | POA: Diagnosis present

## 2022-05-27 MED ORDER — NITROGLYCERIN 0.4 MG SL SUBL: 0.4000 mg | SUBLINGUAL_TABLET | SUBLINGUAL | 3 refills | Status: DC | PRN

## 2022-05-27 MED ORDER — ACETAMINOPHEN 325 MG PO TABS
650.0000 mg | ORAL_TABLET | Freq: Once | ORAL | Status: AC
  Administered 2022-05-27: 650 mg via ORAL
  Filled 2022-05-27: qty 2

## 2022-05-27 NOTE — Assessment & Plan Note (Signed)

## 2022-05-27 NOTE — Discharge Instructions (Signed)
Take Tylenol as needed for pain and soreness.  Return to the emergency department at any time for any new or worsening symptoms of concern.

## 2022-05-27 NOTE — ED Triage Notes (Addendum)
Pt brought in from staff at [facility] for eval for fall in hall that pt reports tripping on her flip flop and hitting her R face, pt c/o neck pain, denies LOC, pt does not take blood thinners, A&O x4, per staff that brought the pt in, she will need transportation home d/t her ride leaving their office

## 2022-05-27 NOTE — Progress Notes (Signed)
    MISHAL PROBERT     MRN: [id number]      DOB: 27-Nov-1945  HPI: Patient is in for annual physical exam.  Immunization is reviewed , and  updated if needed.   PE: BP 128/70   Pulse 91   Ht '5\' 5"'$  (1.651 m)   Wt 185 lb 0.6 oz (83.9 kg)   SpO2 97%   BMI 30.79 kg/m   Pleasant  female, alert and oriented x 3, in no cardio-pulmonary distress. Afebrile. HEENT No facial trauma or asymetry. Sinuses non tender.  Extra occullar muscles intact.. External ears normal, . Neck: decreased ROM, no adenopathy,JVD or thyromegaly.No bruits.  Chest: Clear to ascultation bilaterally.No crackles or wheezes. Non tender to palpation  Cardiovascular system; Heart sounds normal,  S1 and  S2 ,no S3.  Systolic  murmur,   Abdomen: Soft, non tender  Musculoskeletal exam: Decreased  ROM of spine, hips , shoulders and knees. deformity ,swelling and crepitus noted. No muscle wasting or atrophy.   Neurologic: Cranial nerves 2 to 12 intact. Power, tone ,sensation  normal throughout.  disturbance in gait. Depends on cane for safe ambulation, No tremor.  Skin: Intact, no ulceration, erythema , scaling or rash noted. Pigmentation normal throughout  Psych; Normal mood and affect. Judgement and concentration normal   Assessment & Plan:  Annual visit for general adult medical examination with abnormal findings Annual exam as documented. Counseling done  re healthy lifestyle involving commitment to 150 minutes exercise per week, heart healthy diet, and attaining healthy weight.The importance of adequate sleep also discussed. Regular seat belt use and home safety, is also discussed. Changes in health habits are decided on by the patient with goals and time frames  set for achieving them. Immunization and cancer screening needs are specifically addressed at this visit.   Coronary atherosclerosis of native coronary artery Reports poor exercise tolerance and increased fatigue in past 4 months,  cardiology f/u past due   CKD (chronic kidney disease) stage 3, GFR 30-59 ml/min Still needs f/u appointment with Nephrology, will f/u on this

## 2022-05-27 NOTE — Assessment & Plan Note (Signed)
Still needs f/u appointment with Nephrology, will f/u on this

## 2022-05-27 NOTE — Assessment & Plan Note (Signed)
Reports poor exercise tolerance and increased fatigue in past 4 months, cardiology f/u past due

## 2022-05-27 NOTE — Patient Instructions (Addendum)
F/U in 3 months, call if you need me sooner  Labs today, CBC, cmp and EGFr, hBa1C, TSH .and microalb, iron and B12  You are referred to Cardiology  You will be referred to Nephrology after labs are available  NTG is sent in, if you take 2 and still have chest pain call 911  Please try to cut back on cigarettes    Need shingrix vaccines at pharmacy

## 2022-05-27 NOTE — ED Provider Notes (Signed)
[place] Provider Note   CSN: [id number] Arrival date & time: 05/27/22  1158     History  Chief Complaint  Patient presents with   Tammy Suarez    Tammy Suarez is a 76 y.o. female.   Fall  Patient presents for a ground-level fall.  This occurred in her doctor's office.  Fall is described as mechanical and caused by her tripped on tripping on her flip-flop.  During this fall, she struck the right side of her face.  She complains of face pain and neck pain.  She did not lose consciousness.  She is not on blood thinners.  Her medical history does include hypothyroidism, depression, anxiety, HTN, GERD, CAD, CKD, DM, and arthritis.  Currently, patient Tammy Suarez is mild pain in the area of her left face and soreness to her left breast.     Home Medications Prior to Admission medications   Medication Sig Start Date End Date Taking? Authorizing Provider  alendronate (FOSAMAX) 70 MG tablet TAKE 1 TABLET(70 MG) BY MOUTH EVERY 7 DAYS WITH A FULL GLASS OF WATER AND ON AN EMPTY STOMACH 02/11/22   Fayrene Helper, MD  blood glucose meter kit and supplies Dispense based on patient and insurance preference.Once daily testing dx e11.9. 03/25/21   Fayrene Helper, MD  Calcium-Magnesium-Vitamin D (CALCIUM 1200+D3 PO) Take by mouth.    [provider]  DULoxetine (CYMBALTA) 60 MG capsule TAKE ONE CAPSULE BY MOUTH TWICE DAILY 04/10/22   Vena Rua R, FNP  ezetimibe (ZETIA) 10 MG tablet TAKE 1 TABLET(10 MG) BY MOUTH DAILY 01/26/22   Fayrene Helper, MD  famotidine (PEPCID) 20 MG tablet TAKE 1 TABLET(20 MG) BY MOUTH TWICE DAILY 02/11/22   Fayrene Helper, MD  fenofibrate (TRICOR) 145 MG tablet TAKE 1 TABLET BY MOUTH EVERY DAY 01/13/22   Fayrene Helper, MD  isosorbide mononitrate (IMDUR) 30 MG 24 hr tablet TAKE 1 TABLET(30 MG) BY MOUTH AT BEDTIME 08/12/21   Satira Sark, MD  levothyroxine (SYNTHROID) 100 MCG tablet TAKE 1 TABLET(100 MCG) BY MOUTH DAILY  12/15/21   Fayrene Helper, MD  losartan (COZAAR) 100 MG tablet TAKE 1 TABLET BY MOUTH DAILY 02/11/22   Fayrene Helper, MD  metoprolol succinate (TOPROL-XL) 50 MG 24 hr tablet Take 1 tablet (50 mg total) by mouth daily. Take with or immediately following a meal. 11/07/21   Fayrene Helper, MD  multivitamin-iron-minerals-folic acid (CENTRUM) chewable tablet Chew 1 tablet by mouth daily.    [provider]  nitroGLYCERIN (NITROSTAT) 0.4 MG SL tablet Place 1 tablet (0.4 mg total) under the tongue every 5 (five) minutes as needed for chest pain. 05/27/22   Fayrene Helper, MD  Omega-3 Fatty Acids (FISH OIL) 1200 MG CAPS Take by mouth.    [provider]  oxyCODONE-acetaminophen (PERCOCET/ROXICET) 5-325 MG tablet Take 1 tablet by mouth every 4 (four) hours as needed for severe pain.    [provider]  rosuvastatin (CRESTOR) 40 MG tablet TAKE 1 TABLET(40 MG) BY MOUTH DAILY 10/21/21   Paseda, Lillie Columbia R, FNP      Allergies    Gabapentin, Meperidine hcl, Niacin, Propoxyphene n-acetaminophen, and Hydrocodone-acetaminophen    Review of Systems   Review of Systems  HENT:  Positive for facial swelling.   Musculoskeletal:  Positive for neck pain.       Left breast soreness  All other systems reviewed and are negative.   Physical Exam Updated Vital Signs BP (!) 162/73  Pulse 68   Temp 98.3 F (36.8 C) (Oral)   Resp 18   SpO2 100%  Physical Exam Vitals and nursing note reviewed.  Constitutional:      General: She is not in acute distress.    Appearance: Normal appearance. She is well-developed. She is not ill-appearing, toxic-appearing or diaphoretic.  HENT:     Head: Normocephalic.     Right Ear: External ear normal.     Left Ear: External ear normal.     Nose: Nose normal.     Mouth/Throat:     Mouth: Mucous membranes are moist.     Pharynx: Oropharynx is clear.  Eyes:     General: No scleral icterus.    Extraocular Movements: Extraocular  movements intact.     Conjunctiva/sclera: Conjunctivae normal.     Comments: Mild left periorbital bruising and swelling  Cardiovascular:     Rate and Rhythm: Normal rate and regular rhythm.     Heart sounds: No murmur heard. Pulmonary:     Effort: Pulmonary effort is normal. No respiratory distress.     Breath sounds: Normal breath sounds. No wheezing or rales.  Chest:     Chest wall: Tenderness present.  Abdominal:     Palpations: Abdomen is soft.     Tenderness: There is no abdominal tenderness.  Musculoskeletal:        General: No swelling or deformity. Normal range of motion.     Cervical back: Normal range of motion and neck supple.  Skin:    General: Skin is warm and dry.     Capillary Refill: Capillary refill takes less than 2 seconds.  Neurological:     General: No focal deficit present.     Mental Status: She is alert and oriented to person, place, and time.     Cranial Nerves: No cranial nerve deficit.     Sensory: No sensory deficit.     Motor: No weakness.     Coordination: Coordination normal.  Psychiatric:        Mood and Affect: Mood normal.        Behavior: Behavior normal.        Thought Content: Thought content normal.        Judgment: Judgment normal.     ED Results / Procedures / Treatments   Labs (all labs ordered are listed, but only abnormal results are displayed) Labs Reviewed - No data to display  EKG None  Radiology DG Chest Portable 1 View  Result Date: 05/27/2022 CLINICAL DATA:  Fall.  Pain. EXAM: PORTABLE CHEST 1 VIEW COMPARISON:  February 04, 2018 FINDINGS: Deformity of the right humeral head appears nonacute, also present on the scout film from the CT scan of the chest February 10, 2022. No other bony abnormalities. The cardiomediastinal silhouette is stable. No pneumothorax.  No nodules or masses.  No focal infiltrates. No other abnormalities. IMPRESSION: No acute abnormalities. Electronically Signed   By: Dorise Bullion III M.D.   On:  05/27/2022 15:32   CT Head Wo Contrast  Result Date: 05/27/2022 : CLINICAL DATA: Fall. Ground level fall. EXAM: CT HEAD WITHOUT CONTRAST CT MAXILLOFACIAL WITHOUT CONTRAST CT CERVICAL SPINE WITHOUT CONTRAST TECHNIQUE: Multidetector CT imaging of the head, cervical spine, and maxillofacial structures were performed using the standard protocol without intravenous contrast. Multiplanar CT image reconstructions of the cervical spine and maxillofacial structures were also generated. RADIATION DOSE REDUCTION: This exam was performed according to the departmental dose-optimization program which includes automated exposure control,  adjustment of the mA and/or kV according to patient size and/or use of iterative reconstruction technique. COMPARISON: 05/06/2007 FINDINGS: CT HEAD FINDINGS Brain: No acute intracranial hemorrhage. No focal mass lesion. No CT evidence of acute infarction. No midline shift or mass effect. No hydrocephalus. Basilar cisterns are patent. There are periventricular and subcortical white matter hypodensities. Generalized cortical atrophy. Vascular: No hyperdense vessel or unexpected calcification. Skull: Normal. Negative for fracture or focal lesion. Sinuses/Orbits: Paranasal sinuses and mastoid air cells are clear. Orbits are clear. Other: None. CT MAXILLOFACIAL FINDINGS Osseous: No fracture or mandibular dislocation. No destructive process. Orbits: Negative. No traumatic or inflammatory finding. Sinuses: Clear. Soft tissues: Negative. CT CERVICAL SPINE FINDINGS Alignment: Normal alignment of the cervical vertebral bodies. Skull base and vertebrae: Normal craniocervical junction. No loss of vertebral body height or disc height. Normal facet articulation. No evidence of fracture. Soft tissues and spinal canal: No prevertebral soft tissue swelling. No perispinal or epidural hematoma. Disc levels: Anterior endplate spurring from W8-G8. Disc space narrowing at C6-C7 with associated sclerosis. No acute  findings. Upper chest: Clear Other: None IMPRESSION: 1. No intracranial trauma. 2. No facial bone fracture. 3. No cervical spine fracture. 4. Multilevel disc osteophytic disease. Electronically Signed   By: Suzy Bouchard M.D.   On: 05/27/2022 14:05   CT Maxillofacial Wo Contrast  Result Date: 05/27/2022 : CLINICAL DATA: Fall. Ground level fall. EXAM: CT HEAD WITHOUT CONTRAST CT MAXILLOFACIAL WITHOUT CONTRAST CT CERVICAL SPINE WITHOUT CONTRAST TECHNIQUE: Multidetector CT imaging of the head, cervical spine, and maxillofacial structures were performed using the standard protocol without intravenous contrast. Multiplanar CT image reconstructions of the cervical spine and maxillofacial structures were also generated. RADIATION DOSE REDUCTION: This exam was performed according to the departmental dose-optimization program which includes automated exposure control, adjustment of the mA and/or kV according to patient size and/or use of iterative reconstruction technique. COMPARISON: 05/06/2007 FINDINGS: CT HEAD FINDINGS Brain: No acute intracranial hemorrhage. No focal mass lesion. No CT evidence of acute infarction. No midline shift or mass effect. No hydrocephalus. Basilar cisterns are patent. There are periventricular and subcortical white matter hypodensities. Generalized cortical atrophy. Vascular: No hyperdense vessel or unexpected calcification. Skull: Normal. Negative for fracture or focal lesion. Sinuses/Orbits: Paranasal sinuses and mastoid air cells are clear. Orbits are clear. Other: None. CT MAXILLOFACIAL FINDINGS Osseous: No fracture or mandibular dislocation. No destructive process. Orbits: Negative. No traumatic or inflammatory finding. Sinuses: Clear. Soft tissues: Negative. CT CERVICAL SPINE FINDINGS Alignment: Normal alignment of the cervical vertebral bodies. Skull base and vertebrae: Normal craniocervical junction. No loss of vertebral body height or disc height. Normal facet articulation. No  evidence of fracture. Soft tissues and spinal canal: No prevertebral soft tissue swelling. No perispinal or epidural hematoma. Disc levels: Anterior endplate spurring from U1-J0. Disc space narrowing at C6-C7 with associated sclerosis. No acute findings. Upper chest: Clear Other: None IMPRESSION: 1. No intracranial trauma. 2. No facial bone fracture. 3. No cervical spine fracture. 4. Multilevel disc osteophytic disease. Electronically Signed   By: Suzy Bouchard M.D.   On: 05/27/2022 14:05   CT Cervical Spine Wo Contrast  Result Date: 05/27/2022 CLINICAL DATA:  Fall.  Ground level fall. EXAM: CT HEAD WITHOUT CONTRAST CT MAXILLOFACIAL WITHOUT CONTRAST CT CERVICAL SPINE WITHOUT CONTRAST TECHNIQUE: Multidetector CT imaging of the head, cervical spine, and maxillofacial structures were performed using the standard protocol without intravenous contrast. Multiplanar CT image reconstructions of the cervical spine and maxillofacial structures were also generated. RADIATION DOSE REDUCTION: This exam was  performed according to the departmental dose-optimization program which includes automated exposure control, adjustment of the mA and/or kV according to patient size and/or use of iterative reconstruction technique. COMPARISON:  05/06/2007 FINDINGS: CT HEAD FINDINGS Brain: No acute intracranial hemorrhage. No focal mass lesion. No CT evidence of acute infarction. No midline shift or mass effect. No hydrocephalus. Basilar cisterns are patent. There are periventricular and subcortical white matter hypodensities. Generalized cortical atrophy. Vascular: No hyperdense vessel or unexpected calcification. Skull: Normal. Negative for fracture or focal lesion. Sinuses/Orbits: Paranasal sinuses and mastoid air cells are clear. Orbits are clear. Other: None. CT MAXILLOFACIAL FINDINGS Osseous: No fracture or mandibular dislocation. No destructive process. Orbits: Negative. No traumatic or inflammatory finding. Sinuses: Clear. Soft  tissues: Negative. CT CERVICAL SPINE FINDINGS Alignment: Normal alignment of the cervical vertebral bodies. Skull base and vertebrae: Normal craniocervical junction. No loss of vertebral body height or disc height. Normal facet articulation. No evidence of fracture. Soft tissues and spinal canal: No prevertebral soft tissue swelling. No perispinal or epidural hematoma. Disc levels: Anterior endplate spurring from Z6-X0. Disc space narrowing at C6-C7 with associated sclerosis. No acute findings. Upper chest: Clear Other: None IMPRESSION: 1. No intracranial trauma. 2. No facial bone fracture. 3. No cervical spine fracture. 4. Multilevel disc osteophytic disease. Electronically Signed   By: Suzy Bouchard M.D.   On: 05/27/2022 13:55    Procedures Procedures    Medications Ordered in ED Medications  acetaminophen (TYLENOL) tablet 650 mg (650 mg Oral Given 05/27/22 1343)    ED Course/ Medical Decision Making/ A&P                           Medical Decision Making Amount and/or Complexity of Data Reviewed Radiology: ordered.  Risk OTC drugs.   This patient presents to the ED for concern of fall, this involves an extensive number of treatment options, and is a complaint that carries with it a high risk of complications and morbidity.  The differential diagnosis includes acute injuries   Co morbidities that complicate the patient evaluation  HTN, CAD, GERD, hypothyroidism, DM 2, depression, anxiety   Additional history obtained:  Additional history obtained from N/A External records from outside source obtained and reviewed including EMR  Imaging Studies ordered:  I ordered imaging studies including x-ray, CT of head, face, and cervical spine I independently visualized and interpreted imaging which showed no acute injuries I agree with the radiologist interpretation   Cardiac Monitoring: / EKG:  The patient was maintained on a cardiac monitor.  I personally viewed and interpreted  the cardiac monitored which showed an underlying rhythm of: Sinus rhythm   Problem List / ED Course / Critical interventions / Medication management  Patient presents after a ground-level fall.  Fall was described as mechanical and caused by her tripping on her flip-flop.  During this fall, she did strike the left side of her face.  On arrival in the ED, she is overall well-appearing.  She does have some mild periorbital swelling and bruising.  She has tenderness to the left anterior chest, which she also attributes to the fall.  X-ray imaging of chest and CT imaging of head, cervical spine, and face were ordered.  On imaging studies, no acute injuries are identified.  Patient was given Tylenol for analgesia.  She was given reassurance and discharged in good condition. I ordered medication including Tylenol for analgesia Reevaluation of the patient after these medicines showed that the  patient improved I have reviewed the patients home medicines and have made adjustments as needed   Social Determinants of Health:  Lives independently, has access to outpatient care         Final Clinical Impression(s) / ED Diagnoses Final diagnoses:  Fall, initial encounter    Rx / DC Orders ED Discharge Orders     None         Godfrey Pick, MD 05/29/22 1112

## 2022-06-02 ENCOUNTER — Telehealth: Payer: Self-pay | Admitting: Family Medicine

## 2022-06-02 NOTE — Telephone Encounter (Signed)
Pt called stating she was seen last week for a fall & now her lungs are bothering her. She states she is coughing up green stuff. She is wanting to know if she can please get something for this?     [place]

## 2022-06-03 ENCOUNTER — Ambulatory Visit (INDEPENDENT_AMBULATORY_CARE_PROVIDER_SITE_OTHER): Payer: Medicare PPO | Admitting: Family Medicine

## 2022-06-03 DIAGNOSIS — J209 Acute bronchitis, unspecified: Secondary | ICD-10-CM

## 2022-06-03 LAB — HEMOGLOBIN A1C
Est. average glucose Bld gHb Est-mCnc: 126 mg/dL
Hgb A1c MFr Bld: 6 % — ABNORMAL HIGH (ref 4.8–5.6)

## 2022-06-03 LAB — CMP14+EGFR
ALT: 17 IU/L (ref 0–32)
AST: 48 IU/L — ABNORMAL HIGH (ref 0–40)
Albumin/Globulin Ratio: 1.3 (ref 1.2–2.2)
Albumin: 3.7 g/dL — ABNORMAL LOW (ref 3.8–4.8)
Alkaline Phosphatase: 91 IU/L (ref 44–121)
BUN/Creatinine Ratio: 18 (ref 12–28)
BUN: 28 mg/dL — ABNORMAL HIGH (ref 8–27)
Bilirubin Total: 0.6 mg/dL (ref 0.0–1.2)
CO2: 23 mmol/L (ref 20–29)
Calcium: 10.9 mg/dL — ABNORMAL HIGH (ref 8.7–10.3)
Chloride: 100 mmol/L (ref 96–106)
Creatinine, Ser: 1.57 mg/dL — ABNORMAL HIGH (ref 0.57–1.00)
Globulin, Total: 2.9 g/dL (ref 1.5–4.5)
Glucose: 138 mg/dL — ABNORMAL HIGH (ref 70–99)
Potassium: 5.2 mmol/L (ref 3.5–5.2)
Sodium: 138 mmol/L (ref 134–144)
Total Protein: 6.6 g/dL (ref 6.0–8.5)
eGFR: 34 mL/min/{1.73_m2} — ABNORMAL LOW (ref >59)

## 2022-06-03 LAB — VITAMIN B12: Vitamin B-12: 620 pg/mL (ref 232–1245)

## 2022-06-03 LAB — CBC
Hematocrit: 36 % (ref 34.0–46.6)
Hemoglobin: 11.8 g/dL (ref 11.1–15.9)
MCH: 34.1 pg — ABNORMAL HIGH (ref 26.6–33.0)
MCHC: 32.8 g/dL (ref 31.5–35.7)
MCV: 104 fL — ABNORMAL HIGH (ref 79–97)
Platelets: 190 10*3/uL (ref 150–450)
RBC: 3.46 x10E6/uL — ABNORMAL LOW (ref 3.77–5.28)
RDW: 11.3 % — ABNORMAL LOW (ref 11.7–15.4)
WBC: 4.8 10*3/uL (ref 3.4–10.8)

## 2022-06-03 LAB — MICROALBUMIN / CREATININE URINE RATIO
Creatinine, Urine: 119.1 mg/dL
Microalb/Creat Ratio: 102 mg/g creat — ABNORMAL HIGH (ref 0–29)
Microalbumin, Urine: 121.9 ug/mL

## 2022-06-03 LAB — IRON: Iron: 196 ug/dL — ABNORMAL HIGH (ref 27–139)

## 2022-06-03 LAB — TSH: TSH: 1.49 u[IU]/mL (ref 0.450–4.500)

## 2022-06-03 MED ORDER — BENZONATATE 100 MG PO CAPS: 100.0000 mg | ORAL_CAPSULE | Freq: Two times a day (BID) | ORAL | 0 refills | Status: DC | PRN

## 2022-06-03 MED ORDER — AZITHROMYCIN 250 MG PO TABS: ORAL_TABLET | ORAL | 0 refills | Status: AC

## 2022-06-03 NOTE — Telephone Encounter (Signed)
If any prescription med is being requested they have to have a visit or a virtual visit at least. The providers won't call anything in without evaluation

## 2022-06-03 NOTE — Progress Notes (Signed)
Virtual Visit via Video Note  I connected with [name] on 06/03/22 at  3:20 PM EDT by a video enabled telemedicine application and verified that I am speaking with the correct person using two identifiers.  Location: Patient: home Provider: office   I discussed the limitations of evaluation and management by telemedicine and the availability of in person appointments. The patient expressed understanding and agreed to proceed.  History of Present Illness: 3 day h/o cough productive of green sputum, no fever or chills , denies sinus pressure or nasal drainage   Observations/Objective:  There were no vitals taken for this visit. Good communication with no confusion and intact memory. Alert and oriented x 3 No signs of respiratory distress during speech  Assessment and Plan: Acute bronchitis Z pack and tessalon perles are prescribed   Follow Up Instructions:    I discussed the assessment and treatment plan with the patient. The patient was provided an opportunity to ask questions and all were answered. The patient agreed with the plan and demonstrated an understanding of the instructions.   The patient was advised to call back or seek an in-person evaluation if the symptoms worsen or if the condition fails to improve as anticipated.  I provided 7 minutes of non-face-to-face time during this encounter.   Tula Nakayama, MD

## 2022-06-05 ENCOUNTER — Encounter: Payer: Self-pay | Admitting: Family Medicine

## 2022-06-05 DIAGNOSIS — J209 Acute bronchitis, unspecified: Secondary | ICD-10-CM | POA: Insufficient documentation

## 2022-06-05 NOTE — Assessment & Plan Note (Signed)
Z pack and tessalon perles are prescribed

## 2022-06-05 NOTE — Patient Instructions (Addendum)
F/U as before , call if you need me sooner  You are treated for acute bronchitis, z pac and tessalon perles are  prescribed, take entire course   Thanks for choosing  [facility], we consider it a privelige to serve you.

## 2022-06-12 ENCOUNTER — Encounter: Payer: Self-pay | Admitting: Family Medicine

## 2022-07-07 DIAGNOSIS — M542 Cervicalgia: Secondary | ICD-10-CM | POA: Diagnosis not present

## 2022-07-07 DIAGNOSIS — Z79899 Other long term (current) drug therapy: Secondary | ICD-10-CM | POA: Diagnosis not present

## 2022-07-07 DIAGNOSIS — M48061 Spinal stenosis, lumbar region without neurogenic claudication: Secondary | ICD-10-CM | POA: Diagnosis not present

## 2022-07-07 DIAGNOSIS — G894 Chronic pain syndrome: Secondary | ICD-10-CM | POA: Diagnosis not present

## 2022-07-07 DIAGNOSIS — Z79891 Long term (current) use of opiate analgesic: Secondary | ICD-10-CM | POA: Diagnosis not present

## 2022-07-07 DIAGNOSIS — M62838 Other muscle spasm: Secondary | ICD-10-CM | POA: Diagnosis not present

## 2022-07-08 ENCOUNTER — Other Ambulatory Visit: Payer: Self-pay | Admitting: Nurse Practitioner

## 2022-07-08 ENCOUNTER — Other Ambulatory Visit: Payer: Self-pay | Admitting: Family Medicine

## 2022-07-13 ENCOUNTER — Other Ambulatory Visit: Payer: Self-pay | Admitting: Nurse Practitioner

## 2022-08-07 ENCOUNTER — Other Ambulatory Visit: Payer: Self-pay | Admitting: Family Medicine

## 2022-08-07 DIAGNOSIS — F418 Other specified anxiety disorders: Secondary | ICD-10-CM

## 2022-08-28 ENCOUNTER — Ambulatory Visit: Payer: Medicare PPO | Admitting: Family Medicine

## 2022-09-01 DIAGNOSIS — G894 Chronic pain syndrome: Secondary | ICD-10-CM | POA: Diagnosis not present

## 2022-09-01 DIAGNOSIS — M62838 Other muscle spasm: Secondary | ICD-10-CM | POA: Diagnosis not present

## 2022-09-01 DIAGNOSIS — M542 Cervicalgia: Secondary | ICD-10-CM | POA: Diagnosis not present

## 2022-09-01 DIAGNOSIS — Z79891 Long term (current) use of opiate analgesic: Secondary | ICD-10-CM | POA: Diagnosis not present

## 2022-09-01 DIAGNOSIS — Z79899 Other long term (current) drug therapy: Secondary | ICD-10-CM | POA: Diagnosis not present

## 2022-09-01 DIAGNOSIS — M48061 Spinal stenosis, lumbar region without neurogenic claudication: Secondary | ICD-10-CM | POA: Diagnosis not present

## 2022-09-02 ENCOUNTER — Encounter: Payer: Self-pay | Admitting: Cardiology

## 2022-09-02 ENCOUNTER — Ambulatory Visit: Payer: Medicare PPO | Admitting: Cardiology

## 2022-09-02 NOTE — Progress Notes (Deleted)
Cardiology Office Note  Date: 09/02/2022   ID: Tammy Suarez, DOB 05-Dec-1945, MRN [id number]  PCP:  Fayrene Helper, MD  Cardiologist:  Rozann Lesches, MD Electrophysiologist:  None   No chief complaint on file.   History of Present Illness: Tammy Suarez is a 76 y.o. female last seen in April 2022.  Past Medical History:  Diagnosis Date   Arthritis    Carpal tunnel syndrome    Chronic back pain    CKD (chronic kidney disease) stage 3, GFR 30-59 ml/min (HCC)    Coronary atherosclerosis of native coronary artery    Multivessel status post CABG - LIMA to LAD, SVG to OM and SVG to PDA   Depression    Economic stress    Essential hypertension    GERD (gastroesophageal reflux disease)    Headache    History of conjunctivitis    History of hiatal hernia    Hyperlipidemia    Hypothyroidism    Myocardial infarction ([place]) 1999   Obesity    Type 2 diabetes mellitus ([place])    Uterine cancer ([place])    Vitamin B 12 deficiency     Past Surgical History:  Procedure Laterality Date   ABDOMINAL HYSTERECTOMY     BACK SURGERY     CARDIAC SURGERY     CARPAL TUNNEL RELEASE Left 07/23/2016   Procedure: CARPAL TUNNEL RELEASE;  Surgeon: Carole Civil, MD;  Location: [facility];  Service: Orthopedics;  Laterality: Left;   CERVICAL LAMINECTOMY     CHOLECYSTECTOMY     COLONOSCOPY  2007   Dr. Irving Shows: reported to be normal   COLONOSCOPY WITH PROPOFOL N/A 04/29/2020   Procedure: COLONOSCOPY WITH PROPOFOL;  Surgeon: Daneil Dolin, MD;  Location: [facility];  Service: Endoscopy;  Laterality: N/A;  12:45pm   CORONARY ARTERY BYPASS GRAFT N/A 11/02/2013   Procedure: CORONARY ARTERY BYPASS GRAFTING (CABG);  Surgeon: Gaye Pollack, MD;  Location: [facility];  Service: Open Heart Surgery;  Laterality: N/A;  CABG x three, using left internal mammary artery and right leg greater saphenous vein harvested endoscopically   COSMETIC SURGERY  07/01/2012   Recurrent skin infection on lower  abdomen, [facility]   HERNIA REPAIR     Inguinal herniorrhaphy     INTRAOPERATIVE TRANSESOPHAGEAL ECHOCARDIOGRAM N/A 11/02/2013   Procedure: INTRAOPERATIVE TRANSESOPHAGEAL ECHOCARDIOGRAM;  Surgeon: Gaye Pollack, MD;  Location: [facility];  Service: Open Heart Surgery;  Laterality: N/A;   LAPAROSCOPIC GASTRIC BANDING  2009   LEFT HEART CATHETERIZATION WITH CORONARY ANGIOGRAM N/A 10/30/2013   Procedure: LEFT HEART CATHETERIZATION WITH CORONARY ANGIOGRAM;  Surgeon: Minus Breeding, MD;  Location: [facility] CATH LAB;  Service: Cardiovascular;  Laterality: N/A;   Left index finger repair     LUMBAR FUSION     POLYPECTOMY  04/29/2020   Procedure: POLYPECTOMY;  Surgeon: Daneil Dolin, MD;  [facility]: [facility];  Service: Endoscopy;;   SPINE SURGERY     VESICOVAGINAL FISTULA CLOSURE W/ TAH      Current Outpatient Medications  Medication Sig Dispense Refill   alendronate (FOSAMAX) 70 MG tablet TAKE 1 TABLET(70 MG) BY MOUTH EVERY 7 DAYS WITH A FULL GLASS OF WATER AND ON AN EMPTY STOMACH 4 tablet 11   benzonatate (TESSALON) 100 MG capsule Take 1 capsule (100 mg total) by mouth 2 (two) times daily as needed for cough. 20 capsule 0   blood glucose meter kit and supplies Dispense based on patient and insurance preference.Once daily testing dx e11.9.  1 each 0   Calcium-Magnesium-Vitamin D (CALCIUM 1200+D3 PO) Take by mouth.     DULoxetine (CYMBALTA) 60 MG capsule TAKE 1 CAPSULE BY MOUTH TWICE DAILY 180 capsule 0   ezetimibe (ZETIA) 10 MG tablet TAKE 1 TABLET(10 MG) BY MOUTH DAILY 90 tablet 1   famotidine (PEPCID) 20 MG tablet TAKE 1 TABLET(20 MG) BY MOUTH TWICE DAILY 180 tablet 1   fenofibrate (TRICOR) 145 MG tablet TAKE 1 TABLET BY MOUTH EVERY DAY 90 tablet 1   isosorbide mononitrate (IMDUR) 30 MG 24 hr tablet TAKE 1 TABLET(30 MG) BY MOUTH AT BEDTIME 30 tablet 11   levothyroxine (SYNTHROID) 100 MCG tablet TAKE 1 TABLET(100 MCG) BY MOUTH DAILY 90 tablet 3   losartan (COZAAR) 100 MG tablet TAKE 1 TABLET BY MOUTH  DAILY 90 tablet 1   metoprolol succinate (TOPROL-XL) 50 MG 24 hr tablet Take 1 tablet (50 mg total) by mouth daily. Take with or immediately following a meal. 90 tablet 3   multivitamin-iron-minerals-folic acid (CENTRUM) chewable tablet Chew 1 tablet by mouth daily.     nitroGLYCERIN (NITROSTAT) 0.4 MG SL tablet Place 1 tablet (0.4 mg total) under the tongue every 5 (five) minutes as needed for chest pain. 30 tablet 3   Omega-3 Fatty Acids (FISH OIL) 1200 MG CAPS Take by mouth.     oxyCODONE-acetaminophen (PERCOCET/ROXICET) 5-325 MG tablet Take 1 tablet by mouth every 4 (four) hours as needed for severe pain.     rosuvastatin (CRESTOR) 40 MG tablet TAKE 1 TABLET(40 MG) BY MOUTH DAILY 90 tablet 2   No current facility-administered medications for this visit.   Allergies:  Gabapentin, Meperidine hcl, Niacin, Propoxyphene n-acetaminophen, and Hydrocodone-acetaminophen   Social History: The patient  reports that she has been smoking cigarettes. She has a 12.50 pack-year smoking history. She has never used smokeless tobacco. She reports that she does not drink alcohol and does not use drugs.   Family History: The patient's family history includes Cancer in her brother; Drug abuse in her daughter; Heart attack in her father, mother, and sister; Hepatitis in her daughter; Hypertension in her daughter and sister.   ROS:  Please see the history of present illness. Otherwise, complete review of systems is positive for {NONE DEFAULTED:18576}.  All other systems are reviewed and negative.   Physical Exam: VS:  There were no vitals taken for this visit., BMI There is no height or weight on file to calculate BMI.  Wt Readings from Last 3 Encounters:  05/27/22 185 lb 0.6 oz (83.9 kg)  12/30/21 189 lb 1.3 oz (85.8 kg)  05/15/21 193 lb (87.5 kg)    General: Patient appears comfortable at rest. HEENT: Conjunctiva and lids normal, oropharynx clear with moist mucosa. Neck: Supple, no elevated JVP or carotid  bruits, no thyromegaly. Lungs: Clear to auscultation, nonlabored breathing at rest. Cardiac: Regular rate and rhythm, no S3 or significant systolic murmur, no pericardial rub. Abdomen: Soft, nontender, no hepatomegaly, bowel sounds present, no guarding or rebound. Extremities: No pitting edema, distal pulses 2+. Skin: Warm and dry. Musculoskeletal: No kyphosis. Neuropsychiatric: Alert and oriented x3, affect grossly appropriate.  ECG:  An ECG dated 03/12/2021 was personally reviewed today and demonstrated:  Sinus rhythm.  Recent Labwork: 05/27/2022: ALT 17; AST 48; BUN 28; Creatinine, Ser 1.57; Hemoglobin 11.8; Platelets 190; Potassium 5.2; Sodium 138; TSH 1.490     Component Value Date/Time   CHOL 72 (L) 12/30/2021 1440   TRIG 101 12/30/2021 1440   HDL 14 (L) 12/30/2021 1440     CHOLHDL 5.1 (H) 12/30/2021 1440   CHOLHDL 5.2 (H) 12/25/2019 0859   VLDL 37 (H) 04/22/2017 0952   LDLCALC 38 12/30/2021 1440   LDLCALC 51 12/25/2019 0859    Other Studies Reviewed Today:  [facility] Myoview 08/30/2018: There was no ST segment deviation noted during stress. The study is normal. No myocardial ischemia or scar. This is a low risk study. Nuclear stress EF: 72%.  Assessment and Plan:   Medication Adjustments/Labs and Tests Ordered: Current medicines are reviewed at length with the patient today.  Concerns regarding medicines are outlined above.   Tests Ordered: No orders of the defined types were placed in this encounter.   Medication Changes: No orders of the defined types were placed in this encounter.   Disposition:  Follow up {follow up:15908}  Signed, Satira Sark, MD, [facility] 09/02/2022 10:25 AM    [facility] [address], [place], [place] [address] Phone: [phone]; Fax: [phone]

## 2022-09-03 ENCOUNTER — Other Ambulatory Visit: Payer: Self-pay | Admitting: Cardiology

## 2022-10-06 ENCOUNTER — Other Ambulatory Visit: Payer: Self-pay | Admitting: Nurse Practitioner

## 2022-10-06 ENCOUNTER — Other Ambulatory Visit: Payer: Self-pay | Admitting: Cardiology

## 2022-10-06 ENCOUNTER — Other Ambulatory Visit: Payer: Self-pay | Admitting: Family Medicine

## 2022-10-13 DIAGNOSIS — G894 Chronic pain syndrome: Secondary | ICD-10-CM | POA: Diagnosis not present

## 2022-10-13 DIAGNOSIS — Z79899 Other long term (current) drug therapy: Secondary | ICD-10-CM | POA: Diagnosis not present

## 2022-10-13 DIAGNOSIS — M25519 Pain in unspecified shoulder: Secondary | ICD-10-CM | POA: Diagnosis not present

## 2022-10-13 DIAGNOSIS — M62838 Other muscle spasm: Secondary | ICD-10-CM | POA: Diagnosis not present

## 2022-10-13 DIAGNOSIS — Z79891 Long term (current) use of opiate analgesic: Secondary | ICD-10-CM | POA: Diagnosis not present

## 2022-10-13 DIAGNOSIS — M48061 Spinal stenosis, lumbar region without neurogenic claudication: Secondary | ICD-10-CM | POA: Diagnosis not present

## 2022-10-13 DIAGNOSIS — M542 Cervicalgia: Secondary | ICD-10-CM | POA: Diagnosis not present

## 2022-11-05 ENCOUNTER — Other Ambulatory Visit: Payer: Self-pay | Admitting: Family Medicine

## 2022-11-06 ENCOUNTER — Other Ambulatory Visit: Payer: Self-pay | Admitting: Nurse Practitioner

## 2022-11-06 ENCOUNTER — Other Ambulatory Visit: Payer: Self-pay | Admitting: Cardiology

## 2022-11-17 ENCOUNTER — Other Ambulatory Visit: Payer: Self-pay | Admitting: Nurse Practitioner

## 2022-11-18 ENCOUNTER — Telehealth: Payer: Self-pay | Admitting: Cardiology

## 2022-11-18 MED ORDER — ISOSORBIDE MONONITRATE ER 30 MG PO TB24: ORAL_TABLET | ORAL | 1 refills | Status: DC

## 2022-11-18 NOTE — Telephone Encounter (Signed)
*  STAT* If patient is at the pharmacy, call can be transferred to refill team.   1. Which medications need to be refilled? (please list name of each medication and dose if known)  isosorbide mononitrate (IMDUR) 30 MG 24 hr tablet  2. Which pharmacy/location (including street and city if local pharmacy) is medication to be sent to? [facility] [phone] - [place], [address] AT [place]  3. Do they need a 30 day or 90 day supply?  90 day supply

## 2022-11-18 NOTE — Telephone Encounter (Addendum)
Pt made f/u apt, refill sent

## 2022-11-20 ENCOUNTER — Telehealth (INDEPENDENT_AMBULATORY_CARE_PROVIDER_SITE_OTHER): Payer: Medicare PPO | Admitting: Family Medicine

## 2022-11-20 DIAGNOSIS — E785 Hyperlipidemia, unspecified: Secondary | ICD-10-CM

## 2022-11-20 DIAGNOSIS — M48061 Spinal stenosis, lumbar region without neurogenic claudication: Secondary | ICD-10-CM

## 2022-11-20 DIAGNOSIS — E038 Other specified hypothyroidism: Secondary | ICD-10-CM

## 2022-11-20 DIAGNOSIS — E1121 Type 2 diabetes mellitus with diabetic nephropathy: Secondary | ICD-10-CM | POA: Diagnosis not present

## 2022-11-20 DIAGNOSIS — E559 Vitamin D deficiency, unspecified: Secondary | ICD-10-CM

## 2022-11-20 DIAGNOSIS — M5416 Radiculopathy, lumbar region: Secondary | ICD-10-CM

## 2022-11-20 DIAGNOSIS — I25119 Atherosclerotic heart disease of native coronary artery with unspecified angina pectoris: Secondary | ICD-10-CM

## 2022-11-20 DIAGNOSIS — I10 Essential (primary) hypertension: Secondary | ICD-10-CM

## 2022-11-20 MED ORDER — DULOXETINE HCL 60 MG PO CPEP: 60.0000 mg | ORAL_CAPSULE | Freq: Two times a day (BID) | ORAL | 0 refills | Status: DC

## 2022-11-20 NOTE — Patient Instructions (Addendum)
In office visit already scheduled for Jan 19 at 2:40 pm, pls keep appt  Fasting cBC, lipid, cmp and eGFr, hBA1c , tSH, vit D  on Jan 11   Will be referred new pain clinic  Cymbalta is prescribed   Be careful not to fall  Thanks for choosing [facility], we consider it a privelige to serve you.

## 2022-11-22 ENCOUNTER — Encounter: Payer: Self-pay | Admitting: Family Medicine

## 2022-11-22 DIAGNOSIS — E559 Vitamin D deficiency, unspecified: Secondary | ICD-10-CM | POA: Insufficient documentation

## 2022-11-22 NOTE — Assessment & Plan Note (Signed)
Updated lab needed at/ before next visit.   

## 2022-11-22 NOTE — Progress Notes (Signed)
Virtual Visit via Video Note  I connected with Tammy Suarez on 11/20/2022 at  3:00 PM EST by a video enabled telemedicine application and verified that I am speaking with the correct person using two identifiers.  Location: Patient: home Provider: office   I discussed the limitations of evaluation and management by telemedicine and the availability of in person appointments. The patient expressed understanding and agreed to proceed.  History of Present Illness: Needs f/u on chronic problems , no visit in 6 months, states has  difficulty with transportation, she is in constant pain and needs a new clinic. [place] daughter on call with her is asking for refill on cymbalta.    Observations/Objective: Good communication with no confusion and intact memory. No signs of respiratory distress during speech   Assessment and Plan:  Essential hypertension, benign DASH diet and commitment to daily physical activity for a minimum of 30 minutes discussed and encouraged, as a part of hypertension management. The importance of attaining a healthy weight is also discussed.     05/27/2022    3:30 PM 05/27/2022    3:00 PM 05/27/2022    2:00 PM 05/27/2022    1:30 PM 05/27/2022    1:04 PM 05/27/2022   12:15 PM 05/27/2022   11:22 AM  BP/Weight  Systolic BP 678 938 101 [phone] 778  Diastolic BP 73 63 59 61 62 47 70     Uncontrolled on record , needs in office eval  Coronary atherosclerosis of native coronary artery Has upcoming appt with Cardiology, no c/o chest pain with activity, however sedentary  Hypothyroidism Updated lab needed at/ before next visit.   DM (diabetes mellitus), type 2 with renal complications ([place]) Ms. Mckoy is reminded of the importance of commitment to daily physical activity for 30 minutes or more, as able and the need to limit carbohydrate intake to 30 to 60 grams per meal to help with blood sugar control.   Ms. Oh is reminded of the importance of daily foot  exam, annual eye examination, and good blood sugar, blood pressure and cholesterol control.     Latest Ref Rng & Units 05/27/2022   11:38 AM 12/30/2021    2:40 PM 05/13/2021   11:26 AM 04/29/2021    1:16 PM 01/29/2021    2:30 PM  Diabetic Labs  HbA1c 4.8 - 5.6 % 6.0  6.5   6.7  8.1   Micro/Creat Ratio 0 - 29 mg/g creat 102   206     Chol 100 - 199 mg/dL  72   73  77   HDL >39 mg/dL  '14   13  15   '$ Calc LDL 0 - 99 mg/dL  38   39  37   Triglycerides 0 - 149 mg/dL  101   108  145   Creatinine 0.57 - 1.00 mg/dL 1.57  1.84   1.64        05/27/2022    3:30 PM 05/27/2022    3:00 PM 05/27/2022    2:00 PM 05/27/2022    1:30 PM 05/27/2022    1:04 PM 05/27/2022   12:15 PM 05/27/2022   11:22 AM  BP/Weight  Systolic BP 242 353 614 431 540 086 761  Diastolic BP 73 63 59 61 62 47 70      Latest Ref Rng & Units 05/13/2021   12:00 AM 01/23/2020   12:00 AM  Foot/eye exam completion dates  Eye Exam No Retinopathy No Retinopathy  No Retinopathy         This result is from an external source.  Updated lab needed at/ before next visit.       Spinal stenosis of lumbar region with radiculopathy Chronic pain , managed through pain clinic , will refer when she comes for in office next week Cymbalta prescribed for both pain and depression  Hyperlipidemia with target LDL less than 70 Hyperlipidemia:Low fat diet discussed and encouraged.   Lipid Panel  Lab Results  Component Value Date   CHOL 72 (L) 12/30/2021   HDL 14 (L) 12/30/2021   LDLCALC 38 12/30/2021   TRIG 101 12/30/2021   CHOLHDL 5.1 (H) 12/30/2021     Updated lab needed at/ before next visit. Uncontrolled when last checked  Vitamin D deficiency Updated lab needed at/ before next visit.   Follow Up Instructions:    I discussed the assessment and treatment plan with the patient. The patient was provided an opportunity to ask questions and all were answered. The patient agreed with the plan and demonstrated an understanding of  the instructions.   The patient was advised to call back or seek an in-person evaluation if the symptoms worsen or if the condition fails to improve as anticipated.  I provided 12 minutes of non-face-to-face time during this encounter.   Tula Nakayama, MD

## 2022-11-22 NOTE — Assessment & Plan Note (Addendum)
Tammy Suarez is reminded of the importance of commitment to daily physical activity for 30 minutes or more, as able and the need to limit carbohydrate intake to 30 to 60 grams per meal to help with blood sugar control.   Tammy Suarez is reminded of the importance of daily foot exam, annual eye examination, and good blood sugar, blood pressure and cholesterol control.     Latest Ref Rng & Units 05/27/2022   11:38 AM 12/30/2021    2:40 PM 05/13/2021   11:26 AM 04/29/2021    1:16 PM 01/29/2021    2:30 PM  Diabetic Labs  HbA1c 4.8 - 5.6 % 6.0  6.5   6.7  8.1   Micro/Creat Ratio 0 - 29 mg/g creat 102   206     Chol 100 - 199 mg/dL  72   73  77   HDL >39 mg/dL  '14   13  15   '$ Calc LDL 0 - 99 mg/dL  38   39  37   Triglycerides 0 - 149 mg/dL  101   108  145   Creatinine 0.57 - 1.00 mg/dL 1.57  1.84   1.64        05/27/2022    3:30 PM 05/27/2022    3:00 PM 05/27/2022    2:00 PM 05/27/2022    1:30 PM 05/27/2022    1:04 PM 05/27/2022   12:15 PM 05/27/2022   11:22 AM  BP/Weight  Systolic BP 102 585 277 824 235 361 443  Diastolic BP 73 63 59 61 62 47 70      Latest Ref Rng & Units 05/13/2021   12:00 AM 01/23/2020   12:00 AM  Foot/eye exam completion dates  Eye Exam No Retinopathy No Retinopathy     No Retinopathy         This result is from an external source.  Updated lab needed at/ before next visit.

## 2022-11-22 NOTE — Assessment & Plan Note (Signed)
Hyperlipidemia:Low fat diet discussed and encouraged.   Lipid Panel  Lab Results  Component Value Date   CHOL 72 (L) 12/30/2021   HDL 14 (L) 12/30/2021   LDLCALC 38 12/30/2021   TRIG 101 12/30/2021   CHOLHDL 5.1 (H) 12/30/2021     Updated lab needed at/ before next visit. Uncontrolled when last checked

## 2022-11-22 NOTE — Assessment & Plan Note (Signed)
DASH diet and commitment to daily physical activity for a minimum of 30 minutes discussed and encouraged, as a part of hypertension management. The importance of attaining a healthy weight is also discussed.     05/27/2022    3:30 PM 05/27/2022    3:00 PM 05/27/2022    2:00 PM 05/27/2022    1:30 PM 05/27/2022    1:04 PM 05/27/2022   12:15 PM 05/27/2022   11:22 AM  BP/Weight  Systolic BP 701 410 301 [phone] 797  Diastolic BP 73 63 59 61 62 47 70     Uncontrolled on record , needs in office eval

## 2022-11-22 NOTE — Assessment & Plan Note (Signed)
Chronic pain , managed through pain clinic , will refer when she comes for in office next week Cymbalta prescribed for both pain and depression

## 2022-11-22 NOTE — Assessment & Plan Note (Signed)
Has upcoming appt with Cardiology, no c/o chest pain with activity, however sedentary

## 2022-11-23 ENCOUNTER — Other Ambulatory Visit: Payer: Self-pay

## 2022-11-23 DIAGNOSIS — E1121 Type 2 diabetes mellitus with diabetic nephropathy: Secondary | ICD-10-CM

## 2022-11-23 DIAGNOSIS — E785 Hyperlipidemia, unspecified: Secondary | ICD-10-CM

## 2022-11-23 DIAGNOSIS — E559 Vitamin D deficiency, unspecified: Secondary | ICD-10-CM

## 2022-11-23 DIAGNOSIS — I10 Essential (primary) hypertension: Secondary | ICD-10-CM

## 2022-11-24 NOTE — Progress Notes (Unsigned)
Cardiology Clinic Note   Patient Name: [name] Date of Encounter: 11/24/2022  Primary Care Provider:  Fayrene Helper, MD Primary Cardiologist:  Rozann Lesches, MD  Patient Profile    77- year-old female with a past medical history of coronary artery disease, essential hypertension, GERD, type II diabetes, hypothyroidism, CKD stage III, hyperlipidemia, nicotine dependence, who is here for follow-up.  Past Medical History    Past Medical History:  Diagnosis Date   Arthritis    Carpal tunnel syndrome    Chronic back pain    CKD (chronic kidney disease) stage 3, GFR 30-59 ml/min (HCC)    Coronary atherosclerosis of native coronary artery    Multivessel status post CABG - LIMA to LAD, SVG to OM and SVG to PDA   Depression    Economic stress    Essential hypertension    GERD (gastroesophageal reflux disease)    Headache    History of conjunctivitis    History of hiatal hernia    Hyperlipidemia    Hypothyroidism    Myocardial infarction ([place]) 1999   Obesity    Type 2 diabetes mellitus ([place])    Uterine cancer ([place])    Vitamin B 12 deficiency    Past Surgical History:  Procedure Laterality Date   ABDOMINAL HYSTERECTOMY     BACK SURGERY     CARDIAC SURGERY     CARPAL TUNNEL RELEASE Left 07/23/2016   Procedure: CARPAL TUNNEL RELEASE;  Surgeon: Carole Civil, MD;  Location: [facility];  Service: Orthopedics;  Laterality: Left;   CERVICAL LAMINECTOMY     CHOLECYSTECTOMY     COLONOSCOPY  2007   Dr. Irving Shows: reported to be normal   COLONOSCOPY WITH PROPOFOL N/A 04/29/2020   Procedure: COLONOSCOPY WITH PROPOFOL;  Surgeon: Daneil Dolin, MD;  Location: [facility];  Service: Endoscopy;  Laterality: N/A;  12:45pm   CORONARY ARTERY BYPASS GRAFT N/A 11/02/2013   Procedure: CORONARY ARTERY BYPASS GRAFTING (CABG);  Surgeon: Gaye Pollack, MD;  Location: [place];  Service: Open Heart Surgery;  Laterality: N/A;  CABG x three, using left internal mammary artery and  right leg greater saphenous vein harvested endoscopically   COSMETIC SURGERY  07/01/2012   Recurrent skin infection on lower abdomen, [facility]   HERNIA REPAIR     Inguinal herniorrhaphy     INTRAOPERATIVE TRANSESOPHAGEAL ECHOCARDIOGRAM N/A 11/02/2013   Procedure: INTRAOPERATIVE TRANSESOPHAGEAL ECHOCARDIOGRAM;  Surgeon: Gaye Pollack, MD;  Location: [place];  Service: Open Heart Surgery;  Laterality: N/A;   LAPAROSCOPIC GASTRIC BANDING  2009   LEFT HEART CATHETERIZATION WITH CORONARY ANGIOGRAM N/A 10/30/2013   Procedure: LEFT HEART CATHETERIZATION WITH CORONARY ANGIOGRAM;  Surgeon: Minus Breeding, MD;  Location: [facility] CATH LAB;  Service: Cardiovascular;  Laterality: N/A;   Left index finger repair     LUMBAR FUSION     POLYPECTOMY  04/29/2020   Procedure: POLYPECTOMY;  Surgeon: Daneil Dolin, MD;  [facility];  Service: Endoscopy;;   SPINE SURGERY     VESICOVAGINAL FISTULA CLOSURE W/ TAH      Allergies  Allergies  Allergen Reactions   Gabapentin Other (See Comments)    Feels dizzy   Meperidine Hcl Nausea And Vomiting   Niacin Other (See Comments)    REACTION: face peeling and burning   Propoxyphene N-Acetaminophen Nausea And Vomiting   Hydrocodone-Acetaminophen Nausea And Vomiting    History of Present Illness    Tammy Suarez is a 77 year old female with  the previously mentioned past medical history of coronary artery disease s/p CABG in 2014 (LIMA-LAD, SVG- OM, and SVG to PDA), essential hypertension, GERD, hypothyroidism, type II diabetes, CKD stage IIIb, nicotine excess, and hyperlipidemia.  Echocardiogram 2014 with LVEF 55-60%, no WMA, no valvular abnormalities noted. Lexiscan MPI 08/2018 low risk study without ischemia or scar.   She was last seen in clinic by dr. Domenic Polite in 02/2021. At that time she was felling well without complaints of angina or decompensation. There were no medication changes that were made and no testing that was ordered.   She suffered a  mechanical fall striking the right side of her face and was evaluated in the emergency department at [facility] on 05/27/22. No acute injuries where found on imaging. She was treated and then released without incident.   She returns to clinic today  Home Medications    Current Outpatient Medications  Medication Sig Dispense Refill   alendronate (FOSAMAX) 70 MG tablet TAKE 1 TABLET(70 MG) BY MOUTH EVERY 7 DAYS WITH A FULL GLASS OF WATER AND ON AN EMPTY STOMACH 4 tablet 11   blood glucose meter kit and supplies Dispense based on patient and insurance preference.Once daily testing dx e11.9. 1 each 0   Calcium-Magnesium-Vitamin D (CALCIUM 1200+D3 PO) Take by mouth.     DULoxetine (CYMBALTA) 60 MG capsule Take 1 capsule (60 mg total) by mouth 2 (two) times daily. 180 capsule 0   ezetimibe (ZETIA) 10 MG tablet TAKE 1 TABLET(10 MG) BY MOUTH DAILY 90 tablet 1   famotidine (PEPCID) 20 MG tablet TAKE 1 TABLET(20 MG) BY MOUTH TWICE DAILY 180 tablet 1   fenofibrate (TRICOR) 145 MG tablet TAKE 1 TABLET BY MOUTH EVERY DAY 90 tablet 1   isosorbide mononitrate (IMDUR) 30 MG 24 hr tablet TAKE 1 TABLET(30 MG) BY MOUTH AT BEDTIME 90 tablet 1   levothyroxine (SYNTHROID) 100 MCG tablet TAKE 1 TABLET(100 MCG) BY MOUTH DAILY 90 tablet 3   losartan (COZAAR) 100 MG tablet TAKE 1 TABLET BY MOUTH DAILY 90 tablet 1   metoprolol succinate (TOPROL-XL) 50 MG 24 hr tablet Take 1 tablet (50 mg total) by mouth daily. Take with or immediately following a meal. 90 tablet 3   multivitamin-iron-minerals-folic acid (CENTRUM) chewable tablet Chew 1 tablet by mouth daily.     nitroGLYCERIN (NITROSTAT) 0.4 MG SL tablet Place 1 tablet (0.4 mg total) under the tongue every 5 (five) minutes as needed for chest pain. 30 tablet 3   Omega-3 Fatty Acids (FISH OIL) 1200 MG CAPS Take by mouth.     oxyCODONE-acetaminophen (PERCOCET/ROXICET) 5-325 MG tablet Take 1 tablet by mouth every 4 (four) hours as needed for severe pain.     rosuvastatin  (CRESTOR) 40 MG tablet TAKE 1 TABLET(40 MG) BY MOUTH DAILY 90 tablet 2   No current facility-administered medications for this visit.     Family History    Family History  Problem Relation Age of Onset   Heart attack Mother    Heart attack Father    Hypertension Sister    Heart attack Sister    Cancer Brother        Renal cell    Hepatitis Daughter    Drug abuse Daughter    Hypertension Daughter    Colon cancer Neg Hx    She indicated that her mother is deceased. She indicated that her father is deceased. She indicated that her sister is deceased. She indicated that her brother is deceased. She indicated that  only one of her two daughters is alive. She indicated that her son is deceased. She indicated that the status of her neg hx is unknown.  Social History    Social History   Socioeconomic History   Marital status: Single    Spouse name: Not on file   Number of children: 3   Years of education: Not on file   Highest education level: GED or equivalent  Occupational History   Not on file  Tobacco Use   Smoking status: Every Day    Packs/day: 0.25    Years: 50.00    Total pack years: 12.50    Types: Cigarettes   Smokeless tobacco: Never   Tobacco comments:    smokes about 10 a day   Vaping Use   Vaping Use: Never used  Substance and Sexual Activity   Alcohol use: No    Alcohol/week: 0.0 standard drinks of alcohol   Drug use: No   Sexual activity: Never    Birth control/protection: Surgical  Other Topics Concern   Not on file  Social History Narrative   Not on file   Social Determinants of Health   Financial Resource Strain: Low Risk  (07/18/2021)   Overall Financial Resource Strain (CARDIA)    Difficulty of Paying Living Expenses: Not hard at all  Food Insecurity: No Food Insecurity (07/18/2021)   Hunger Vital Sign    Worried About Running Out of Food in the Last Year: Never true    [place] in the Last Year: Never true  Transportation Needs: No  Transportation Needs (07/18/2021)   PRAPARE - Hydrologist (Medical): No    Lack of Transportation (Non-Medical): No  Physical Activity: Inactive (07/18/2021)   Exercise Vital Sign    Days of Exercise per Week: 0 days    Minutes of Exercise per Session: 0 min  Stress: No Stress Concern Present (07/18/2021)   [place]    Feeling of Stress : Only a little  Social Connections: Socially Isolated (07/18/2021)   Social Connection and Isolation Panel [NHANES]    Frequency of Communication with Friends and Family: Three times a week    Frequency of Social Gatherings with Friends and Family: Three times a week    Attends Religious Services: Never    Active Member of Clubs or Organizations: No    Attends Archivist Meetings: Never    Marital Status: Widowed  Intimate Partner Violence: Not At Risk (07/18/2021)   Humiliation, Afraid, Rape, and Kick questionnaire    Fear of Current or Ex-Partner: No    Emotionally Abused: No    Physically Abused: No    Sexually Abused: No     Review of Systems    General:  No chills, fever, night sweats or weight changes.  Cardiovascular:  No chest pain, dyspnea on exertion, edema, orthopnea, palpitations, paroxysmal nocturnal dyspnea. Dermatological: No rash, lesions/masses Respiratory: No cough, dyspnea Urologic: No hematuria, dysuria Abdominal:   No nausea, vomiting, diarrhea, bright red blood per rectum, melena, or hematemesis Neurologic:  No visual changes, wkns, changes in mental status. All other systems reviewed and are otherwise negative except as noted above.     Physical Exam    VS:  There were no vitals taken for this visit. , BMI There is no height or weight on file to calculate BMI.     GEN: Well nourished, well developed, in no acute distress.  HEENT: normal. Neck: Supple, no JVD, carotid bruits, or masses. Cardiac: RRR, no murmurs, rubs, or  gallops. No clubbing, cyanosis, edema.  Radials 2+/PT 2+ and equal bilaterally.  Respiratory:  Respirations regular and unlabored, clear to auscultation bilaterally. GI: Soft, nontender, nondistended, BS + x 4. MS: no deformity or atrophy. Skin: warm and dry, no rash. Neuro:  Strength and sensation are intact. Psych: Normal affect.  Accessory Clinical Findings    ECG personally reviewed by me today- *** - No acute changes  Lab Results  Component Value Date   WBC 4.8 05/27/2022   HGB 11.8 05/27/2022   HCT 36.0 05/27/2022   MCV 104 (H) 05/27/2022   PLT 190 05/27/2022   Lab Results  Component Value Date   CREATININE 1.57 (H) 05/27/2022   BUN 28 (H) 05/27/2022   NA 138 05/27/2022   K 5.2 05/27/2022   CL 100 05/27/2022   CO2 23 05/27/2022   Lab Results  Component Value Date   ALT 17 05/27/2022   AST 48 (H) 05/27/2022   ALKPHOS 91 05/27/2022   BILITOT 0.6 05/27/2022   Lab Results  Component Value Date   CHOL 72 (L) 12/30/2021   HDL 14 (L) 12/30/2021   LDLCALC 38 12/30/2021   TRIG 101 12/30/2021   CHOLHDL 5.1 (H) 12/30/2021    Lab Results  Component Value Date   HGBA1C 6.0 (H) 05/27/2022    Assessment & Plan   1.  ***  Warrick Llera, NP 11/24/2022, 4:35 PM

## 2022-11-26 ENCOUNTER — Encounter: Payer: Self-pay | Admitting: Cardiology

## 2022-11-26 ENCOUNTER — Other Ambulatory Visit (HOSPITAL_COMMUNITY)
Admission: RE | Admit: 2022-11-26 | Discharge: 2022-11-26 | Disposition: A | Discharge status: Home / Self Care | Payer: Medicare PPO | Source: Ambulatory Visit | Attending: Family Medicine | Admitting: Family Medicine

## 2022-11-26 ENCOUNTER — Ambulatory Visit: Payer: Medicare PPO | Attending: Cardiology | Admitting: Cardiology

## 2022-11-26 VITALS — BP 128/64 | HR 90 | Ht 64.0 in | Wt 177.2 lb

## 2022-11-26 DIAGNOSIS — I10 Essential (primary) hypertension: Secondary | ICD-10-CM | POA: Diagnosis not present

## 2022-11-26 DIAGNOSIS — E785 Hyperlipidemia, unspecified: Secondary | ICD-10-CM | POA: Insufficient documentation

## 2022-11-26 DIAGNOSIS — E1121 Type 2 diabetes mellitus with diabetic nephropathy: Secondary | ICD-10-CM | POA: Diagnosis not present

## 2022-11-26 DIAGNOSIS — R079 Chest pain, unspecified: Secondary | ICD-10-CM

## 2022-11-26 DIAGNOSIS — I25119 Atherosclerotic heart disease of native coronary artery with unspecified angina pectoris: Secondary | ICD-10-CM

## 2022-11-26 DIAGNOSIS — E559 Vitamin D deficiency, unspecified: Secondary | ICD-10-CM | POA: Insufficient documentation

## 2022-11-26 DIAGNOSIS — Z720 Tobacco use: Secondary | ICD-10-CM | POA: Diagnosis not present

## 2022-11-26 DIAGNOSIS — N1832 Chronic kidney disease, stage 3b: Secondary | ICD-10-CM | POA: Diagnosis not present

## 2022-11-26 DIAGNOSIS — E782 Mixed hyperlipidemia: Secondary | ICD-10-CM | POA: Diagnosis not present

## 2022-11-26 DIAGNOSIS — R0602 Shortness of breath: Secondary | ICD-10-CM | POA: Diagnosis not present

## 2022-11-26 LAB — COMPREHENSIVE METABOLIC PANEL
ALT: 16 U/L (ref 0–44)
AST: 42 U/L — ABNORMAL HIGH (ref 15–41)
Albumin: 3 g/dL — ABNORMAL LOW (ref 3.5–5.0)
Alkaline Phosphatase: 66 U/L (ref 38–126)
Anion gap: 9 (ref 5–15)
BUN: 34 mg/dL — ABNORMAL HIGH (ref 8–23)
CO2: 23 mmol/L (ref 22–32)
Calcium: 9.5 mg/dL (ref 8.9–10.3)
Chloride: 99 mmol/L (ref 98–111)
Creatinine, Ser: 1.6 mg/dL — ABNORMAL HIGH (ref 0.44–1.00)
GFR, Estimated: 33 mL/min — ABNORMAL LOW (ref >60)
Glucose, Bld: 125 mg/dL — ABNORMAL HIGH (ref 70–99)
Potassium: 4.6 mmol/L (ref 3.5–5.1)
Sodium: 131 mmol/L — ABNORMAL LOW (ref 135–145)
Total Bilirubin: 0.9 mg/dL (ref 0.3–1.2)
Total Protein: 6.8 g/dL (ref 6.5–8.1)

## 2022-11-26 LAB — CBC
HCT: 33.9 % — ABNORMAL LOW (ref 36.0–46.0)
Hemoglobin: 11.2 g/dL — ABNORMAL LOW (ref 12.0–15.0)
MCH: 33.8 pg (ref 26.0–34.0)
MCHC: 33 g/dL (ref 30.0–36.0)
MCV: 102.4 fL — ABNORMAL HIGH (ref 80.0–100.0)
Platelets: 167 10*3/uL (ref 150–400)
RBC: 3.31 MIL/uL — ABNORMAL LOW (ref 3.87–5.11)
RDW: 13.7 % (ref 11.5–15.5)
WBC: 4.6 10*3/uL (ref 4.0–10.5)
nRBC: 0 % (ref 0.0–0.2)

## 2022-11-26 LAB — LIPID PANEL
Cholesterol: 89 mg/dL (ref 0–200)
HDL: <10 mg/dL — ABNORMAL LOW (ref >40)
Triglycerides: 97 mg/dL (ref <150)
VLDL: 19 mg/dL (ref 0–40)

## 2022-11-26 LAB — TSH: TSH: 0.331 u[IU]/mL — ABNORMAL LOW (ref 0.350–4.500)

## 2022-11-26 LAB — VITAMIN D 25 HYDROXY (VIT D DEFICIENCY, FRACTURES): Vit D, 25-Hydroxy: 28 ng/mL — ABNORMAL LOW (ref 30–100)

## 2022-11-26 MED ORDER — ISOSORBIDE MONONITRATE ER 30 MG PO TB24: ORAL_TABLET | ORAL | 3 refills | Status: DC

## 2022-11-26 MED ORDER — NITROGLYCERIN 0.4 MG SL SUBL: 0.4000 mg | SUBLINGUAL_TABLET | SUBLINGUAL | 3 refills | Status: DC | PRN

## 2022-11-26 NOTE — Patient Instructions (Signed)
Medication Instructions:  Your physician recommends that you continue on your current medications as directed. Please refer to the Current Medication list given to you today.   Labwork: Complete lab work provided by Dr. Moshe Cipro  Testing/Procedures: Your physician has requested that you have a lexiscan myoview. For further information please visit [email or web address]. Please follow instruction sheet, as given.  Your physician has requested that you have an echocardiogram. Echocardiography is a painless test that uses sound waves to create images of your heart. It provides your doctor with information about the size and shape of your heart and how well your heart's chambers and valves are working. This procedure takes approximately one hour. There are no restrictions for this procedure. Please do NOT wear cologne, perfume, aftershave, or lotions (deodorant is allowed). Please arrive 15 minutes prior to your appointment time.   Follow-Up: Follow up with [facility] after testing.   Any Other Special Instructions Will Be Listed Below (If Applicable).     If you need a refill on your cardiac medications before your next appointment, please call your pharmacy.

## 2022-11-27 LAB — HEMOGLOBIN A1C
Hgb A1c MFr Bld: 6.1 % — ABNORMAL HIGH (ref 4.8–5.6)
Mean Plasma Glucose: 128 mg/dL

## 2022-12-03 ENCOUNTER — Other Ambulatory Visit (HOSPITAL_COMMUNITY): Payer: Medicare PPO

## 2022-12-04 ENCOUNTER — Ambulatory Visit (INDEPENDENT_AMBULATORY_CARE_PROVIDER_SITE_OTHER): Payer: Medicare PPO | Admitting: Family Medicine

## 2022-12-04 ENCOUNTER — Encounter: Payer: Self-pay | Admitting: Family Medicine

## 2022-12-04 VITALS — BP 114/70 | HR 74 | Ht 64.0 in | Wt 177.0 lb

## 2022-12-04 DIAGNOSIS — E785 Hyperlipidemia, unspecified: Secondary | ICD-10-CM | POA: Diagnosis not present

## 2022-12-04 DIAGNOSIS — I10 Essential (primary) hypertension: Secondary | ICD-10-CM

## 2022-12-04 DIAGNOSIS — F17218 Nicotine dependence, cigarettes, with other nicotine-induced disorders: Secondary | ICD-10-CM

## 2022-12-04 DIAGNOSIS — E038 Other specified hypothyroidism: Secondary | ICD-10-CM

## 2022-12-04 DIAGNOSIS — Z23 Encounter for immunization: Secondary | ICD-10-CM | POA: Diagnosis not present

## 2022-12-04 DIAGNOSIS — G894 Chronic pain syndrome: Secondary | ICD-10-CM | POA: Diagnosis not present

## 2022-12-04 DIAGNOSIS — F418 Other specified anxiety disorders: Secondary | ICD-10-CM

## 2022-12-04 DIAGNOSIS — E559 Vitamin D deficiency, unspecified: Secondary | ICD-10-CM | POA: Diagnosis not present

## 2022-12-04 DIAGNOSIS — E1121 Type 2 diabetes mellitus with diabetic nephropathy: Secondary | ICD-10-CM | POA: Diagnosis not present

## 2022-12-04 MED ORDER — PREGABALIN 25 MG PO CAPS: 25.0000 mg | ORAL_CAPSULE | Freq: Every day | ORAL | 2 refills | Status: DC

## 2022-12-04 NOTE — Patient Instructions (Addendum)
F/u in 4.5  month, call if you need me sooner  No med Changes  Fasting lipid, cmp and EGFr, hBA1C, TSH, vit D  Please check insurance to see if they will cover in home health, I do not think so  I will refer you to a pain clinic if you have not been there   New is lyrica one daily for pain  Please work on stopping smoking  Thanks for choosing [facility], we consider it a privelige to serve you.

## 2022-12-07 ENCOUNTER — Encounter: Payer: Self-pay | Admitting: Family Medicine

## 2022-12-07 DIAGNOSIS — G8929 Other chronic pain: Secondary | ICD-10-CM | POA: Insufficient documentation

## 2022-12-07 NOTE — Assessment & Plan Note (Signed)
No change in supplement at this time, nearly at goal

## 2022-12-07 NOTE — Assessment & Plan Note (Signed)
Significant generalized arthritis causing chronic pain and limiting safe mobility. Multiple co morbidities place her at high risk needs to be managed by pain Specilaist, will refer, has had challenges remaining in a clinic,reports of medication being stoilen, multiple fslls, trial of low dose lyrica in the interim

## 2022-12-07 NOTE — Assessment & Plan Note (Signed)
[name] is reminded of the importance of commitment to daily physical activity for 30 minutes or more, as able and the need to limit carbohydrate intake to 30 to 60 grams per meal to help with blood sugar control.    [name] is reminded of the importance of daily foot exam, annual eye examination, and good blood sugar, blood pressure and cholesterol control.     Latest Ref Rng & Units 11/26/2022    3:27 PM 05/27/2022   11:38 AM 12/30/2021    2:40 PM 05/13/2021   11:26 AM 04/29/2021    1:16 PM  Diabetic Labs  HbA1c 4.8 - 5.6 % 6.1  6.0  6.5   6.7   Micro/Creat Ratio 0 - 29 mg/g creat  102   206    Chol 0 - 200 mg/dL 89   72   73   HDL >40 mg/dL '10   14   13   '$ Calc LDL 0 - 99 mg/dL NOT CALCULATED   38   39   Triglycerides <150 mg/dL 97   101   108   Creatinine 0.44 - 1.00 mg/dL 1.60  1.57  1.84   1.64       12/04/2022    3:42 PM 12/04/2022    2:41 PM 11/26/2022    2:10 PM 05/27/2022    3:30 PM 05/27/2022    3:00 PM 05/27/2022    2:00 PM 05/27/2022    1:30 PM  BP/Weight  Systolic BP 277 412 878 676 720 947 096  Diastolic BP 70 78 64 73 63 59 61  Wt. (Lbs)  177 177.2      BMI  30.38 kg/m2 30.42 kg/m2          Latest Ref Rng & Units 05/13/2021   12:00 AM 01/23/2020   12:00 AM  Foot/eye exam completion dates  Eye Exam No Retinopathy No Retinopathy     No Retinopathy         This result is from an external source.      Controlled, no change in management

## 2022-12-07 NOTE — Assessment & Plan Note (Signed)
No change in management

## 2022-12-07 NOTE — Assessment & Plan Note (Signed)
Asked:confirms currently smokes cigarettes Assess: Unwilling to set a quit date, intermittently trying to cut back Advise: needs to QUIT to reduce risk of cancer, cardio and cerebrovascular disease Assist: counseled for 5 minutes and literature provided Arrange: follow up in 2 to 4 months

## 2022-12-07 NOTE — Assessment & Plan Note (Signed)
Controlled, no change in medication  

## 2022-12-07 NOTE — Progress Notes (Signed)
Tammy Suarez     MRN: [id number]      DOB: June 24, 1946   HPI Tammy Suarez is here for follow up and re-evaluation of chronic medical conditions, medication management and review of any available recent lab and radiology data.  Preventive health is updated, specifically  Cancer screening and Immunization.   Questions or concerns regarding consultations or procedures which the PT has had in the interim are  addressed.c/o uncontrolled generalized pain needs new pain clinic as not getting appts with previous one The PT denies any adverse reactions to current medications since the last visit.  ROS Denies recent fever or chills. Denies sinus pressure, nasal congestion, ear pain or sore throat. Denies chest congestion, productive cough or wheezing. Has scheduled cardiac test for ischemia upcoming Denies abdominal pain, nausea, vomiting,diarrhea or constipation.   Denies dysuria, frequency, hesitancy y. Denies headaches, seizures, numbness, or tingling. Chronic  depression, anxiety or insomnia. Denies skin break down or rash.   PE  BP 114/70   Pulse 74   Ht '5\' 4"'$  (1.626 m)   Wt 177 lb (80.3 kg)   SpO2 94%   BMI 30.38 kg/m   Patient alert and oriented and in no cardiopulmonary distress.  HEENT: No facial asymmetry, EOMI,     Neck decreased ROM.  Chest: decreased air  entry  CVS: S1, S2 systolic  murmurs, no S3.Regular rate.  ABD: Soft non tender.   Ext: No edema  MS: decreased  ROM spine, shoulders, hips and knees.  Skin: Intact, no ulcerations or rash noted.  Psych: Good eye contact, flat  affect. Mildly anxious oand depressed appearing.  CNS: CN 2-12 intact, power,  normal throughout.no focal deficits noted.   Assessment & Plan  Essential hypertension, benign Controlled, no change in medication   DM (diabetes mellitus), type 2 with renal complications ([place]) Tammy Suarez is reminded of the importance of commitment to daily physical activity for 30 minutes or more, as  able and the need to limit carbohydrate intake to 30 to 60 grams per meal to help with blood sugar control.    Tammy Suarez is reminded of the importance of daily foot exam, annual eye examination, and good blood sugar, blood pressure and cholesterol control.     Latest Ref Rng & Units 11/26/2022    3:27 PM 05/27/2022   11:38 AM 12/30/2021    2:40 PM 05/13/2021   11:26 AM 04/29/2021    1:16 PM  Diabetic Labs  HbA1c 4.8 - 5.6 % 6.1  6.0  6.5   6.7   Micro/Creat Ratio 0 - 29 mg/g creat  102   206    Chol 0 - 200 mg/dL 89   72   73   HDL >40 mg/dL '10   14   13   '$ Calc LDL 0 - 99 mg/dL NOT CALCULATED   38   39   Triglycerides <150 mg/dL 97   101   108   Creatinine 0.44 - 1.00 mg/dL 1.60  1.57  1.84   1.64       12/04/2022    3:42 PM 12/04/2022    2:41 PM 11/26/2022    2:10 PM 05/27/2022    3:30 PM 05/27/2022    3:00 PM 05/27/2022    2:00 PM 05/27/2022    1:30 PM  BP/Weight  Systolic BP 659 935 701 779 390 300 923  Diastolic BP 70 78 64 73 63 59 61  Wt. (Lbs)  177 177.2  BMI  30.38 kg/m2 30.42 kg/m2          Latest Ref Rng & Units 05/13/2021   12:00 AM 01/23/2020   12:00 AM  Foot/eye exam completion dates  Eye Exam No Retinopathy No Retinopathy     No Retinopathy         This result is from an external source.      Controlled, no change in management   Hypothyroidism No change in management  Vitamin D deficiency No change in supplement at this time, nearly at goal  Nicotine dependence Asked:confirms currently smokes cigarettes Assess: Unwilling to set a quit date, intermittently trying to cut back Advise: needs to QUIT to reduce risk of cancer, cardio and cerebrovascular disease Assist: counseled for 5 minutes and literature provided Arrange: follow up in 2 to 4 months   Depression with anxiety Uncontrolled, not suicidal or homicdal, states her uncontrolled pain is very depressing and her lack of independent transportation  Chronic pain Significant generalized  arthritis causing chronic pain and limiting safe mobility. Multiple co morbidities place her at high risk needs to be managed by pain Specilaist, will refer, has had challenges remaining in a clinic,reports of medication being stoilen, multiple fslls, trial of low dose lyrica in the interim

## 2022-12-07 NOTE — Assessment & Plan Note (Signed)
Uncontrolled, not suicidal or homicdal, states her uncontrolled pain is very depressing and her lack of independent transportation

## 2022-12-09 ENCOUNTER — Telehealth (INDEPENDENT_AMBULATORY_CARE_PROVIDER_SITE_OTHER): Payer: Medicare PPO | Admitting: Family Medicine

## 2022-12-09 DIAGNOSIS — M48061 Spinal stenosis, lumbar region without neurogenic claudication: Secondary | ICD-10-CM | POA: Diagnosis not present

## 2022-12-09 DIAGNOSIS — J0110 Acute frontal sinusitis, unspecified: Secondary | ICD-10-CM

## 2022-12-09 DIAGNOSIS — J440 Chronic obstructive pulmonary disease with acute lower respiratory infection: Secondary | ICD-10-CM | POA: Diagnosis not present

## 2022-12-09 DIAGNOSIS — M5416 Radiculopathy, lumbar region: Secondary | ICD-10-CM

## 2022-12-09 DIAGNOSIS — R110 Nausea: Secondary | ICD-10-CM

## 2022-12-09 DIAGNOSIS — J209 Acute bronchitis, unspecified: Secondary | ICD-10-CM | POA: Diagnosis not present

## 2022-12-09 DIAGNOSIS — F17218 Nicotine dependence, cigarettes, with other nicotine-induced disorders: Secondary | ICD-10-CM | POA: Diagnosis not present

## 2022-12-09 MED ORDER — PROMETHAZINE-DM 6.25-15 MG/5ML PO SYRP: ORAL_SOLUTION | ORAL | 0 refills | Status: DC

## 2022-12-09 MED ORDER — AZITHROMYCIN 250 MG PO TABS: ORAL_TABLET | ORAL | 0 refills | Status: AC

## 2022-12-09 MED ORDER — BENZONATATE 100 MG PO CAPS: 100.0000 mg | ORAL_CAPSULE | Freq: Two times a day (BID) | ORAL | 0 refills | Status: DC | PRN

## 2022-12-09 MED ORDER — ONDANSETRON HCL 4 MG PO TABS: ORAL_TABLET | ORAL | 0 refills | Status: DC

## 2022-12-09 NOTE — Patient Instructions (Signed)
Follow-up as before, call if you need me sooner.  Tessalon Perles azithromycin cough suppressant syrup as well as medication for nausea sent to your pharmacy.  If your symptoms persist or worsen please call so that you can be evaluated in the office.  The referral for pain management has been authorized , you should get call from that office with appointment information.    Thanks for choosing [facility], we consider it a privelige to serve you.

## 2022-12-09 NOTE — Progress Notes (Signed)
Virtual Visit via Video Note  I connected with [name] on 12/09/22 at 11:40 AM EST by a video enabled telemedicine application and verified that I am speaking with the correct person using two identifiers.  Location: Patient: home Provider: office    I discussed the limitations of evaluation and management by telemedicine and the availability of in person appointments. The patient expressed understanding and agreed to proceed.  History of Present Illness:   4 day h/o dry hacking cough, sometimes green spiutum, no fever or chills, head and chest congestion, no sore throat or ear pain Wants update on pain management. C/o chronic nausea Observations/Objective: Good communication with no confusion and intact memory. Alert and oriented x 3 Nasal congestion and cough during speech Assessment and Plan: Acute bronchitis with COPD ([place]) Tessalon perles and Z pack prescribed also phenergan DM  Acute sinusitis Z pack prescribed  Spinal stenosis of lumbar region with radiculopathy C/o uncontrolled pain, appt with pain management pending  Nicotine dependence Asked:confirms currently smokes cigarettes Assess: Unwilling to set a quit date, but is cutting back Advise: needs to QUIT to reduce risk of cancer, cardio and cerebrovascular disease Assist: counseled for 5 minutes and literature provided Arrange: follow up in 2 to 4 months   Nausea Zofran prescribed short term   Follow Up Instructions:    I discussed the assessment and treatment plan with the patient. The patient was provided an opportunity to ask questions and all were answered. The patient agreed with the plan and demonstrated an understanding of the instructions.   The patient was advised to call back or seek an in-person evaluation if the symptoms worsen or if the condition fails to improve as anticipated.  I provided 13 minutes of non-face-to-face time during this encounter.   Tula Nakayama, MD

## 2022-12-11 ENCOUNTER — Ambulatory Visit (HOSPITAL_COMMUNITY): Payer: Medicare PPO

## 2022-12-11 ENCOUNTER — Encounter (HOSPITAL_COMMUNITY): Payer: Medicare PPO

## 2022-12-14 ENCOUNTER — Encounter: Payer: Self-pay | Admitting: Family Medicine

## 2022-12-14 DIAGNOSIS — J209 Acute bronchitis, unspecified: Secondary | ICD-10-CM | POA: Insufficient documentation

## 2022-12-14 DIAGNOSIS — R110 Nausea: Secondary | ICD-10-CM | POA: Insufficient documentation

## 2022-12-14 DIAGNOSIS — J019 Acute sinusitis, unspecified: Secondary | ICD-10-CM | POA: Insufficient documentation

## 2022-12-14 NOTE — Assessment & Plan Note (Signed)
Asked:confirms currently smokes cigarettes °Assess: Unwilling to set a quit date, but is cutting back °Advise: needs to QUIT to reduce risk of cancer, cardio and cerebrovascular disease °Assist: counseled for 5 minutes and literature provided °Arrange: follow up in 2 to 4 months ° °

## 2022-12-14 NOTE — Assessment & Plan Note (Signed)
C/o uncontrolled pain, appt with pain management pending

## 2022-12-14 NOTE — Assessment & Plan Note (Signed)
Zofran prescribed short term

## 2022-12-14 NOTE — Assessment & Plan Note (Signed)
Z pack prescribed 

## 2022-12-14 NOTE — Assessment & Plan Note (Signed)
Tessalon perles and Z pack prescribed also phenergan DM

## 2022-12-22 ENCOUNTER — Other Ambulatory Visit: Payer: Self-pay | Admitting: Family Medicine

## 2022-12-30 ENCOUNTER — Ambulatory Visit (HOSPITAL_COMMUNITY): Payer: Medicare PPO

## 2022-12-30 ENCOUNTER — Encounter (HOSPITAL_COMMUNITY): Payer: Medicare PPO

## 2022-12-31 ENCOUNTER — Other Ambulatory Visit: Payer: Self-pay | Admitting: Family Medicine

## 2023-01-12 ENCOUNTER — Telehealth: Payer: Self-pay | Admitting: Family Medicine

## 2023-01-12 NOTE — Telephone Encounter (Signed)
Pt called stating [facility] does not take her insu. Wants to know if referral can be sent to [facility]  Ph# [phone] Fx# [phone]

## 2023-01-13 ENCOUNTER — Other Ambulatory Visit: Payer: Self-pay

## 2023-01-13 DIAGNOSIS — G894 Chronic pain syndrome: Secondary | ICD-10-CM

## 2023-01-13 NOTE — Telephone Encounter (Signed)
Referral entered  

## 2023-01-14 ENCOUNTER — Other Ambulatory Visit: Payer: Self-pay | Admitting: Family Medicine

## 2023-01-14 ENCOUNTER — Encounter: Payer: Self-pay | Admitting: Radiology

## 2023-01-18 NOTE — Telephone Encounter (Signed)
Patient called in regard to previous tele. States  she called pain clinic  and they have not received referral from office. Patient wants call back    [facility]   Ph# [phone] Fx# [phone]

## 2023-01-19 ENCOUNTER — Telehealth: Payer: Self-pay | Admitting: Family Medicine

## 2023-01-19 NOTE — Telephone Encounter (Signed)
Patient called said [facility] Tammy Suarez has not receive referral information yet,

## 2023-01-25 ENCOUNTER — Telehealth: Payer: Self-pay | Admitting: Family Medicine

## 2023-01-25 NOTE — Telephone Encounter (Signed)
Pt called stating the pain clinic she was referred to only does injections. She has already had this done and it does not work. She is wanting to know if she can please be referred to [facility]?

## 2023-01-30 ENCOUNTER — Other Ambulatory Visit: Payer: Self-pay | Admitting: Family Medicine

## 2023-02-19 ENCOUNTER — Other Ambulatory Visit: Payer: Self-pay | Admitting: Family Medicine

## 2023-02-23 DIAGNOSIS — J189 Pneumonia, unspecified organism: Secondary | ICD-10-CM | POA: Diagnosis not present

## 2023-02-23 DIAGNOSIS — I21A1 Myocardial infarction type 2: Secondary | ICD-10-CM | POA: Diagnosis not present

## 2023-02-23 DIAGNOSIS — N1832 Chronic kidney disease, stage 3b: Secondary | ICD-10-CM | POA: Diagnosis not present

## 2023-02-23 DIAGNOSIS — R918 Other nonspecific abnormal finding of lung field: Secondary | ICD-10-CM | POA: Diagnosis not present

## 2023-02-23 DIAGNOSIS — E669 Obesity, unspecified: Secondary | ICD-10-CM | POA: Diagnosis not present

## 2023-02-23 DIAGNOSIS — I214 Non-ST elevation (NSTEMI) myocardial infarction: Secondary | ICD-10-CM | POA: Diagnosis not present

## 2023-02-23 DIAGNOSIS — R0781 Pleurodynia: Secondary | ICD-10-CM | POA: Diagnosis not present

## 2023-02-23 DIAGNOSIS — R000 Tachycardia, unspecified: Secondary | ICD-10-CM | POA: Diagnosis not present

## 2023-02-23 DIAGNOSIS — Z7901 Long term (current) use of anticoagulants: Secondary | ICD-10-CM | POA: Diagnosis not present

## 2023-02-23 DIAGNOSIS — J441 Chronic obstructive pulmonary disease with (acute) exacerbation: Secondary | ICD-10-CM | POA: Diagnosis not present

## 2023-02-23 DIAGNOSIS — Z951 Presence of aortocoronary bypass graft: Secondary | ICD-10-CM | POA: Diagnosis not present

## 2023-02-23 DIAGNOSIS — Z7982 Long term (current) use of aspirin: Secondary | ICD-10-CM | POA: Diagnosis not present

## 2023-02-23 DIAGNOSIS — N281 Cyst of kidney, acquired: Secondary | ICD-10-CM | POA: Diagnosis not present

## 2023-02-23 DIAGNOSIS — I509 Heart failure, unspecified: Secondary | ICD-10-CM | POA: Diagnosis not present

## 2023-02-23 DIAGNOSIS — I471 Supraventricular tachycardia, unspecified: Secondary | ICD-10-CM | POA: Diagnosis not present

## 2023-02-23 DIAGNOSIS — J811 Chronic pulmonary edema: Secondary | ICD-10-CM | POA: Diagnosis not present

## 2023-02-23 DIAGNOSIS — R262 Difficulty in walking, not elsewhere classified: Secondary | ICD-10-CM | POA: Diagnosis not present

## 2023-02-23 DIAGNOSIS — I4719 Other supraventricular tachycardia: Secondary | ICD-10-CM | POA: Diagnosis not present

## 2023-02-23 DIAGNOSIS — R0789 Other chest pain: Secondary | ICD-10-CM | POA: Diagnosis not present

## 2023-02-23 DIAGNOSIS — I10 Essential (primary) hypertension: Secondary | ICD-10-CM | POA: Diagnosis not present

## 2023-02-23 DIAGNOSIS — R079 Chest pain, unspecified: Secondary | ICD-10-CM | POA: Diagnosis not present

## 2023-02-23 DIAGNOSIS — I5030 Unspecified diastolic (congestive) heart failure: Secondary | ICD-10-CM | POA: Diagnosis not present

## 2023-02-23 DIAGNOSIS — R042 Hemoptysis: Secondary | ICD-10-CM | POA: Diagnosis not present

## 2023-02-23 DIAGNOSIS — I5031 Acute diastolic (congestive) heart failure: Secondary | ICD-10-CM | POA: Diagnosis not present

## 2023-02-23 DIAGNOSIS — I130 Hypertensive heart and chronic kidney disease with heart failure and stage 1 through stage 4 chronic kidney disease, or unspecified chronic kidney disease: Secondary | ICD-10-CM | POA: Diagnosis not present

## 2023-02-23 DIAGNOSIS — I081 Rheumatic disorders of both mitral and tricuspid valves: Secondary | ICD-10-CM | POA: Diagnosis not present

## 2023-02-23 DIAGNOSIS — R2689 Other abnormalities of gait and mobility: Secondary | ICD-10-CM | POA: Diagnosis not present

## 2023-02-23 DIAGNOSIS — I4891 Unspecified atrial fibrillation: Secondary | ICD-10-CM | POA: Diagnosis not present

## 2023-02-23 DIAGNOSIS — F17200 Nicotine dependence, unspecified, uncomplicated: Secondary | ICD-10-CM | POA: Diagnosis not present

## 2023-02-23 DIAGNOSIS — D631 Anemia in chronic kidney disease: Secondary | ICD-10-CM | POA: Diagnosis not present

## 2023-02-23 DIAGNOSIS — N179 Acute kidney failure, unspecified: Secondary | ICD-10-CM | POA: Diagnosis not present

## 2023-02-23 DIAGNOSIS — Z79899 Other long term (current) drug therapy: Secondary | ICD-10-CM | POA: Diagnosis not present

## 2023-02-23 DIAGNOSIS — Z66 Do not resuscitate: Secondary | ICD-10-CM | POA: Diagnosis not present

## 2023-02-23 DIAGNOSIS — R7989 Other specified abnormal findings of blood chemistry: Secondary | ICD-10-CM | POA: Diagnosis not present

## 2023-02-23 DIAGNOSIS — R001 Bradycardia, unspecified: Secondary | ICD-10-CM | POA: Diagnosis not present

## 2023-02-23 DIAGNOSIS — R059 Cough, unspecified: Secondary | ICD-10-CM | POA: Diagnosis not present

## 2023-02-23 DIAGNOSIS — R0602 Shortness of breath: Secondary | ICD-10-CM | POA: Diagnosis not present

## 2023-02-23 DIAGNOSIS — I5032 Chronic diastolic (congestive) heart failure: Secondary | ICD-10-CM | POA: Diagnosis not present

## 2023-02-23 DIAGNOSIS — E1122 Type 2 diabetes mellitus with diabetic chronic kidney disease: Secondary | ICD-10-CM | POA: Diagnosis not present

## 2023-02-23 DIAGNOSIS — R0600 Dyspnea, unspecified: Secondary | ICD-10-CM | POA: Diagnosis not present

## 2023-02-23 DIAGNOSIS — E739 Lactose intolerance, unspecified: Secondary | ICD-10-CM | POA: Diagnosis not present

## 2023-02-23 DIAGNOSIS — Z720 Tobacco use: Secondary | ICD-10-CM | POA: Diagnosis not present

## 2023-02-23 DIAGNOSIS — F32A Depression, unspecified: Secondary | ICD-10-CM | POA: Diagnosis not present

## 2023-02-23 DIAGNOSIS — E7439 Other disorders of intestinal carbohydrate absorption: Secondary | ICD-10-CM | POA: Diagnosis not present

## 2023-02-23 DIAGNOSIS — Z7401 Bed confinement status: Secondary | ICD-10-CM | POA: Diagnosis not present

## 2023-02-23 DIAGNOSIS — I2510 Atherosclerotic heart disease of native coronary artery without angina pectoris: Secondary | ICD-10-CM | POA: Diagnosis not present

## 2023-03-02 ENCOUNTER — Telehealth: Payer: Self-pay | Admitting: Family Medicine

## 2023-03-02 NOTE — Telephone Encounter (Signed)
The Pain management clinic is out of network with pt insurance. May need another referral. Contact at earliest convenience.

## 2023-03-04 ENCOUNTER — Telehealth: Payer: Self-pay | Admitting: Family Medicine

## 2023-03-04 ENCOUNTER — Telehealth: Payer: Self-pay

## 2023-03-04 ENCOUNTER — Emergency Department (HOSPITAL_COMMUNITY)
Admission: EM | Admit: 2023-03-04 | Discharge: 2023-03-05 | Disposition: A | Discharge status: Home / Self Care | Payer: Medicare PPO | Attending: Emergency Medicine | Admitting: Emergency Medicine

## 2023-03-04 ENCOUNTER — Other Ambulatory Visit: Payer: Self-pay

## 2023-03-04 ENCOUNTER — Encounter (HOSPITAL_COMMUNITY): Payer: Self-pay | Admitting: Emergency Medicine

## 2023-03-04 DIAGNOSIS — R531 Weakness: Secondary | ICD-10-CM

## 2023-03-04 DIAGNOSIS — R0902 Hypoxemia: Secondary | ICD-10-CM | POA: Diagnosis not present

## 2023-03-04 DIAGNOSIS — R001 Bradycardia, unspecified: Secondary | ICD-10-CM | POA: Diagnosis not present

## 2023-03-04 DIAGNOSIS — I499 Cardiac arrhythmia, unspecified: Secondary | ICD-10-CM | POA: Diagnosis not present

## 2023-03-04 DIAGNOSIS — R2681 Unsteadiness on feet: Secondary | ICD-10-CM | POA: Insufficient documentation

## 2023-03-04 DIAGNOSIS — J189 Pneumonia, unspecified organism: Secondary | ICD-10-CM | POA: Diagnosis not present

## 2023-03-04 DIAGNOSIS — R2689 Other abnormalities of gait and mobility: Secondary | ICD-10-CM | POA: Insufficient documentation

## 2023-03-04 DIAGNOSIS — I10 Essential (primary) hypertension: Secondary | ICD-10-CM | POA: Diagnosis not present

## 2023-03-04 HISTORY — DX: Heart failure, unspecified: I50.9

## 2023-03-04 HISTORY — DX: Unspecified atrial fibrillation: I48.91

## 2023-03-04 LAB — CBC WITH DIFFERENTIAL/PLATELET
Abs Immature Granulocytes: 0.29 10*3/uL — ABNORMAL HIGH (ref 0.00–0.07)
Basophils Absolute: 0 10*3/uL (ref 0.0–0.1)
Basophils Relative: 0 %
Eosinophils Absolute: 0.1 10*3/uL (ref 0.0–0.5)
Eosinophils Relative: 1 %
HCT: 35.7 % — ABNORMAL LOW (ref 36.0–46.0)
Hemoglobin: 11.7 g/dL — ABNORMAL LOW (ref 12.0–15.0)
Immature Granulocytes: 3 %
Lymphocytes Relative: 21 %
Lymphs Abs: 2.1 10*3/uL (ref 0.7–4.0)
MCH: 33.3 pg (ref 26.0–34.0)
MCHC: 32.8 g/dL (ref 30.0–36.0)
MCV: 101.7 fL — ABNORMAL HIGH (ref 80.0–100.0)
Monocytes Absolute: 0.8 10*3/uL (ref 0.1–1.0)
Monocytes Relative: 8 %
Neutro Abs: 6.4 10*3/uL (ref 1.7–7.7)
Neutrophils Relative %: 67 %
Platelets: 200 10*3/uL (ref 150–400)
RBC: 3.51 MIL/uL — ABNORMAL LOW (ref 3.87–5.11)
RDW: 13.6 % (ref 11.5–15.5)
WBC: 9.7 10*3/uL (ref 4.0–10.5)
nRBC: 0 % (ref 0.0–0.2)

## 2023-03-04 LAB — BASIC METABOLIC PANEL
Anion gap: 7 (ref 5–15)
BUN: 39 mg/dL — ABNORMAL HIGH (ref 8–23)
CO2: 26 mmol/L (ref 22–32)
Calcium: 9.8 mg/dL (ref 8.9–10.3)
Chloride: 99 mmol/L (ref 98–111)
Creatinine, Ser: 1.16 mg/dL — ABNORMAL HIGH (ref 0.44–1.00)
GFR, Estimated: 49 mL/min — ABNORMAL LOW (ref >60)
Glucose, Bld: 113 mg/dL — ABNORMAL HIGH (ref 70–99)
Potassium: 4.4 mmol/L (ref 3.5–5.1)
Sodium: 132 mmol/L — ABNORMAL LOW (ref 135–145)

## 2023-03-04 LAB — CBG MONITORING, ED: Glucose-Capillary: 112 mg/dL — ABNORMAL HIGH (ref 70–99)

## 2023-03-04 MED ORDER — DULOXETINE HCL 30 MG PO CPEP
60.0000 mg | ORAL_CAPSULE | Freq: Two times a day (BID) | ORAL | Status: DC
  Administered 2023-03-04 – 2023-03-05 (×2): 60 mg via ORAL
  Filled 2023-03-04 (×2): qty 2

## 2023-03-04 MED ORDER — EZETIMIBE 10 MG PO TABS
10.0000 mg | ORAL_TABLET | Freq: Every day | ORAL | Status: DC
  Administered 2023-03-04 – 2023-03-05 (×2): 10 mg via ORAL
  Filled 2023-03-04 (×2): qty 1

## 2023-03-04 MED ORDER — ISOSORBIDE MONONITRATE ER 30 MG PO TB24
30.0000 mg | ORAL_TABLET | Freq: Every day | ORAL | Status: DC
  Administered 2023-03-04 – 2023-03-05 (×2): 30 mg via ORAL
  Filled 2023-03-04 (×2): qty 1

## 2023-03-04 MED ORDER — FENOFIBRATE 160 MG PO TABS
160.0000 mg | ORAL_TABLET | Freq: Every day | ORAL | Status: DC
  Administered 2023-03-04 – 2023-03-05 (×2): 160 mg via ORAL
  Filled 2023-03-04 (×2): qty 1

## 2023-03-04 MED ORDER — LOSARTAN POTASSIUM 25 MG PO TABS
100.0000 mg | ORAL_TABLET | Freq: Every day | ORAL | Status: DC
  Administered 2023-03-04 – 2023-03-05 (×2): 100 mg via ORAL
  Filled 2023-03-04 (×2): qty 4

## 2023-03-04 MED ORDER — FAMOTIDINE 20 MG PO TABS
10.0000 mg | ORAL_TABLET | Freq: Two times a day (BID) | ORAL | Status: DC
  Administered 2023-03-04 – 2023-03-05 (×2): 10 mg via ORAL
  Filled 2023-03-04 (×2): qty 1

## 2023-03-04 MED ORDER — PREGABALIN 25 MG PO CAPS
25.0000 mg | ORAL_CAPSULE | Freq: Every day | ORAL | Status: DC
  Administered 2023-03-04 – 2023-03-05 (×2): 25 mg via ORAL
  Filled 2023-03-04 (×2): qty 1

## 2023-03-04 MED ORDER — ROSUVASTATIN CALCIUM 20 MG PO TABS
40.0000 mg | ORAL_TABLET | Freq: Every day | ORAL | Status: DC
  Administered 2023-03-04 – 2023-03-05 (×2): 40 mg via ORAL
  Filled 2023-03-04 (×2): qty 2

## 2023-03-04 MED ORDER — LEVOTHYROXINE SODIUM 50 MCG PO TABS
100.0000 ug | ORAL_TABLET | Freq: Every day | ORAL | Status: DC
  Administered 2023-03-05: 100 ug via ORAL
  Filled 2023-03-04: qty 2

## 2023-03-04 NOTE — ED Provider Notes (Signed)
[place] [facility] DE[facility]MENT AT [facility] Provider Note   CSN: [id number] Arrival date & time: 03/04/23  1548     History  Chief Complaint  Patient presents with   Weakness    Tammy Suarez is a 77 y.o. female.  Patient with admission at [facility] from 02/23/2023 to 03/03/2023 for acute hypoxic respiratory failure 2/2 CAP and type 2 NSTEMI, additionally was found to have RUL mass-like consolidation, concerning for malignancy.  She is living at home with her daughter, who was admitted to the hospital for congestive heart failure about 2 days ago.  Patient was brought home from the hospital yesterday and she does not have anyone to help care for her.  She has limited ambulation with a walker but typically needs a lot of help.  She reportedly "uses a pad" and cannot get herself up to the restroom.  Daughter called the ambulance today as she is in the hospital.  Patient complains of generalized weakness.  She states that her breathing is overall improved from her recent hospitalization.  She denies pain or fevers.       Home Medications Prior to Admission medications   Medication Sig Start Date End Date Taking? Authorizing Provider  alendronate (FOSAMAX) 70 MG tablet TAKE 1 TABLET(70 MG) BY MOUTH EVERY 7 DAYS WITH A FULL GLASS OF WATER AND ON AN EMPTY STOMACH 02/11/22   Kerri Perches, MD  benzonatate (TESSALON) 100 MG capsule Take 1 capsule (100 mg total) by mouth 2 (two) times daily as needed for cough. 12/09/22   Kerri Perches, MD  blood glucose meter kit and supplies Dispense based on patient and insurance preference.Once daily testing dx e11.9. 03/25/21   Kerri Perches, MD  Calcium-Magnesium-Vitamin D (CALCIUM 1200+D3 PO) Take by mouth.    [provider]  DULoxetine (CYMBALTA) 60 MG capsule TAKE 1 CAPSULE BY MOUTH TWICE DAILY 02/22/23   Kerri Perches, MD  ezetimibe (ZETIA) 10 MG tablet TAKE 1 TABLET(10 MG) BY MOUTH DAILY 10/06/22   Kerri Perches, MD  famotidine (PEPCID) 20 MG tablet TAKE 1 TABLET(20 MG) BY MOUTH TWICE DAILY 12/31/22   Kerri Perches, MD  fenofibrate (TRICOR) 145 MG tablet TAKE 1 TABLET BY MOUTH EVERY DAY 02/01/23   Kerri Perches, MD  isosorbide mononitrate (IMDUR) 30 MG 24 hr tablet TAKE 1 TABLET(30 MG) BY MOUTH AT BEDTIME 11/26/22   Charlsie Quest, NP  levothyroxine (SYNTHROID) 100 MCG tablet TAKE 1 TABLET(100 MCG) BY MOUTH DAILY 12/22/22   Kerri Perches, MD  losartan (COZAAR) 100 MG tablet TAKE 1 TABLET BY MOUTH DAILY 11/05/22   Kerri Perches, MD  metoprolol succinate (TOPROL-XL) 50 MG 24 hr tablet Take 1 tablet (50 mg total) by mouth daily. Take with or immediately following a meal. 11/07/21   Kerri Perches, MD  multivitamin-iron-minerals-folic acid (CENTRUM) chewable tablet Chew 1 tablet by mouth daily.    [provider]  nitroGLYCERIN (NITROSTAT) 0.4 MG SL tablet Place 1 tablet (0.4 mg total) under the tongue every 5 (five) minutes as needed for chest pain. 11/26/22   Charlsie Quest, NP  Omega-3 Fatty Acids (FISH OIL) 1200 MG CAPS Take by mouth.    [provider]  ondansetron (ZOFRAN) 4 MG tablet Take one tablet by mouth once daily, as needed, for nausea 12/09/22   Kerri Perches, MD  oxyCODONE-acetaminophen (PERCOCET/ROXICET) 5-325 MG tablet Take 1 tablet by mouth every 4 (four) hours as needed for severe pain.  [provider]  pregabalin (LYRICA) 25 MG capsule Take 1 capsule (25 mg total) by mouth daily. 12/04/22   Kerri Perches, MD  promethazine-dextromethorphan (PROMETHAZINE-DM) 6.25-15 MG/5ML syrup One teaspoon at bedtime, as needed, for excess cough 12/09/22   Kerri Perches, MD  rosuvastatin (CRESTOR) 40 MG tablet TAKE 1 TABLET(40 MG) BY MOUTH DAILY 01/14/23   Kerri Perches, MD      Allergies    Gabapentin, Meperidine hcl, Niacin, Propoxyphene n-acetaminophen, and Hydrocodone-acetaminophen    Review of Systems   Review of  Systems  Physical Exam Updated Vital Signs BP (!) 192/68   Pulse (!) 58   Temp 98 F (36.7 C) (Oral)   Resp 20   Ht 5\' 4"  (1.626 m)   Wt 80 kg   SpO2 95%   BMI 30.27 kg/m   Physical Exam Vitals and nursing note reviewed.  Constitutional:      General: She is not in acute distress.    Appearance: She is well-developed.  HENT:     Head: Normocephalic and atraumatic.     Right Ear: External ear normal.     Left Ear: External ear normal.     Nose: Nose normal.     Mouth/Throat:     Mouth: Mucous membranes are moist.  Eyes:     Conjunctiva/sclera: Conjunctivae normal.  Cardiovascular:     Rate and Rhythm: Normal rate and regular rhythm.     Heart sounds: No murmur heard. Pulmonary:     Effort: No respiratory distress.     Breath sounds: No wheezing, rhonchi or rales.  Abdominal:     Palpations: Abdomen is soft.     Tenderness: There is no abdominal tenderness. There is no guarding or rebound.  Musculoskeletal:     Cervical back: Normal range of motion and neck supple.     Right lower leg: No edema.     Left lower leg: No edema.  Skin:    General: Skin is warm and dry.     Findings: No rash.  Neurological:     General: No focal deficit present.     Mental Status: She is alert. Mental status is at baseline.     Motor: No weakness.  Psychiatric:        Mood and Affect: Mood normal.     ED Results / Procedures / Treatments   Labs (all labs ordered are listed, but only abnormal results are displayed) Labs Reviewed  CBC WITH DIFFERENTIAL/PLATELET - Abnormal; Notable for the following components:      Result Value   RBC 3.51 (*)    Hemoglobin 11.7 (*)    HCT 35.7 (*)    MCV 101.7 (*)    Abs Immature Granulocytes 0.29 (*)    All other components within normal limits  BASIC METABOLIC PANEL - Abnormal; Notable for the following components:   Sodium 132 (*)    Glucose, Bld 113 (*)    BUN 39 (*)    Creatinine, Ser 1.16 (*)    GFR, Estimated 49 (*)    All other  components within normal limits  CBG MONITORING, ED - Abnormal; Notable for the following components:   Glucose-Capillary 112 (*)    All other components within normal limits    EKG None  Radiology No results found.  Procedures Procedures    Medications Ordered in ED Medications  DULoxetine (CYMBALTA) DR capsule 60 mg (has no administration in time range)  ezetimibe (ZETIA) tablet 10 mg (has  no administration in time range)  famotidine (PEPCID) tablet 10 mg (has no administration in time range)  fenofibrate tablet 160 mg (has no administration in time range)  isosorbide mononitrate (IMDUR) 24 hr tablet 30 mg (has no administration in time range)  levothyroxine (SYNTHROID) tablet 100 mcg (has no administration in time range)  losartan (COZAAR) tablet 100 mg (has no administration in time range)  pregabalin (LYRICA) capsule 25 mg (has no administration in time range)  rosuvastatin (CRESTOR) tablet 40 mg (has no administration in time range)    ED Course/ Medical Decision Making/ A&P    Patient seen and examined. History obtained directly from patient.  I also reviewed patient's recent hospitalization notes from [facility]  Labs/EKG: Ordered CBC and BMP  Imaging: Ordered None.  Medications/Fluids: Ordered: None  Most recent vital signs reviewed and are as follows: BP (!) 192/68   Pulse (!) 58   Temp 98 F (36.7 C) (Oral)   Resp 20   Ht  (1.626 m)   Wt 80 kg   SpO2 95%   BMI 30.27 kg/m   Initial impression: Boarder.   6:56 PM Reassessment performed. Patient appears stable.  Labs personally reviewed and interpreted including: CBC with white blood cell count normal, hemoglobin 11.7; BMP sodium 132, glucose 113, creatinine 1.16 and BUN of 39.  Most current vital signs reviewed and are as follows: BP (!) 192/68   Pulse (!) 58   Temp 98 F (36.7 C) (Oral)   Resp 20   Ht  (1.626 m)   Wt 80 kg   SpO2 95%   BMI 30.27 kg/m   Plan: Patient's home  medications have been reviewed and ordered.  Initial TOC contact made.  PT eval order placed.  Patient discussed with and seen by Dr. Rhunette Croft.                            Medical Decision Making Amount and/or Complexity of Data Reviewed Labs: ordered.  Risk Prescription drug management.   Patient does not currently have a caregiver at home and is an unsafe situation living alone.  Labs at baseline.  Recent hospitalization reviewed.  Patient completed 5 days of Rocephin and doxycycline for recent pneumonia and does not require additional antibiotic therapy at this time.        Final Clinical Impression(s) / ED Diagnoses Final diagnoses:  Generalized weakness    Rx / DC Orders ED Discharge Orders     None         Renne Crigler, PA-C 03/04/23 1859    Derwood Kaplan, MD 03/06/23 1559

## 2023-03-04 NOTE — Telephone Encounter (Signed)
Daughter called said patient just release from hospital 04.17.2024 tried to schedule TOC but daughter at the hospital now with congestion heart failure.    Asked for [place] to contact patient at home but the patient does not have a phone at home to contact patient.

## 2023-03-04 NOTE — Transitions of Care (Post Inpatient/ED Visit) (Signed)
   03/04/2023  Name: Aramis Zobel MRN: [id number] DOB: 1946/09/06  Today's TOC FU Call Status: Today's TOC FU Call Status:: Unsuccessul Call (1st Attempt) Unsuccessful Call (1st Attempt) Date: 03/04/23  Attempted to reach the patient regarding the most recent Inpatient/ED visit.  Follow Up Plan: Additional outreach attempts will be made to reach the patient to complete the Transitions of Care (Post Inpatient/ED visit) call.   Jodelle Gross, RN, BSN, CCM Care Management Coordinator [facility] Phone: [phone]/Fax: [phone]

## 2023-03-04 NOTE — ED Triage Notes (Signed)
Ems called out by DSS because pt cannot be by herself at home. Pt was discharged from [facility] yesterday and was supposed to stay with daughter but daughter was admitted to this facility yesterday. Pt c/o generalized weakness and per ems needs skilled nursing placement.

## 2023-03-04 NOTE — ED Notes (Signed)
Pt has been changed and had a full bed change. Pt is cleaned up and placed on a purewick. Pt was given water and resting at this time.

## 2023-03-04 NOTE — ED Notes (Signed)
Sacral foam placed on pt.

## 2023-03-05 ENCOUNTER — Telehealth: Payer: Self-pay | Admitting: Family Medicine

## 2023-03-05 DIAGNOSIS — Z743 Need for continuous supervision: Secondary | ICD-10-CM | POA: Diagnosis not present

## 2023-03-05 DIAGNOSIS — R279 Unspecified lack of coordination: Secondary | ICD-10-CM | POA: Diagnosis not present

## 2023-03-05 LAB — CBG MONITORING, ED: Glucose-Capillary: 103 mg/dL — ABNORMAL HIGH (ref 70–99)

## 2023-03-05 MED ORDER — LORAZEPAM 0.5 MG PO TABS
0.5000 mg | ORAL_TABLET | Freq: Once | ORAL | Status: AC
  Administered 2023-03-05: 0.5 mg via ORAL
  Filled 2023-03-05: qty 1

## 2023-03-05 NOTE — ED Notes (Addendum)
Correction

## 2023-03-05 NOTE — NC FL2 (Signed)
[place] MEDICAID FL2 LEVEL OF CARE FORM     IDENTIFICATION  Patient Name: Tammy Suarez Birthdate: 02-13-1946 Sex: female Admission Date (Current Location): 03/04/2023  [facility] and [place] Number:  [employer] and Address:  [facility],  [address], [place] [address]      Provider Number: [id number]  Attending Physician Name and Address:  Default, Provider, MD  Relative Name and Phone Number:       Current Level of Care: Hospital Recommended Level of Care: Skilled Nursing Facility Prior Approval Number:    Date Approved/Denied:   PASRR Number:    Discharge Plan: SNF    Current Diagnoses: Patient Active Problem List   Diagnosis Date Noted   Acute sinusitis 12/14/2022   Acute bronchitis with COPD 12/14/2022   Nausea 12/14/2022   Chronic pain 12/07/2022   Vitamin D deficiency 11/22/2022   Annual visit for general adult medical examination with abnormal findings 05/27/2022   Osteoarthritis 01/29/2021   Right shoulder pain 09/18/2020   Positive colorectal cancer screening using Cologuard test 02/12/2020   Constipation 02/12/2020   Closed fracture of proximal end of right humerus 04/29/19 05/15/2019   Fracture of right shoulder 05/02/2019   Carpal tunnel syndrome, left    Benign mole 10/02/2015   Left carpal tunnel syndrome 12/04/2014   Coronary atherosclerosis of native coronary artery 11/02/2013   CKD (chronic kidney disease) stage 3, GFR 30-59 ml/min    DM (diabetes mellitus), type 2 with renal complications 09/15/2013   Recurrent falls 07/28/2013   Urinary incontinence 03/23/2013   Nicotine dependence 11/24/2012   Hypothyroidism 09/28/2010   Depression with anxiety 09/28/2010   FATIGUE 02/08/2009   VITAMIN B12 DEFICIENCY 12/09/2007   Hyperlipidemia with target LDL less than 70 12/09/2007   Obesity (BMI 30.0-34.9) 12/09/2007   Essential hypertension, benign 12/09/2007   GERD 12/09/2007   Spinal stenosis of lumbar region with  radiculopathy 12/09/2007    Orientation RESPIRATION BLADDER Height & Weight     Self, Time, Situation, Place  Normal Continent Weight: 80 kg Height:   (162.6 cm)  BEHAVIORAL SYMPTOMS/MOOD NEUROLOGICAL BOWEL NUTRITION STATUS      Continent Diet  AMBULATORY STATUS COMMUNICATION OF NEEDS Skin   Extensive Assist Verbally Normal                       Personal Care Assistance Level of Assistance  Bathing, Dressing, Total care Bathing Assistance: Limited assistance   Dressing Assistance: Limited assistance Total Care Assistance: Maximum assistance   Functional Limitations Info             SPECIAL CARE FACTORS FREQUENCY  PT (By licensed PT), OT (By licensed OT)     PT Frequency: 5x weekly OT Frequency: 5x weekly            Contractures      Additional Factors Info  Code Status, Allergies Code Status Info: Full             Current Medications (03/05/2023):  This is the current hospital active medication list Current Facility-Administered Medications  Medication Dose Route Frequency Provider Last Rate Last Admin   DULoxetine (CYMBALTA) DR capsule 60 mg  60 mg Oral BID Renne Crigler, PA-C   60 mg at 03/05/23 1010   ezetimibe (ZETIA) tablet 10 mg  10 mg Oral Daily Renne Crigler, PA-C   10 mg at 03/05/23 1010   famotidine (PEPCID) tablet 10 mg  10 mg Oral BID Renne Crigler,  PA-C   10 mg at 03/05/23 1011   fenofibrate tablet 160 mg  160 mg Oral Daily Renne Crigler, PA-C   160 mg at 03/05/23 1011   isosorbide mononitrate (IMDUR) 24 hr tablet 30 mg  30 mg Oral Daily Renne Crigler, PA-C   30 mg at 03/05/23 1009   levothyroxine (SYNTHROID) tablet 100 mcg  100 mcg Oral Q0600 Renne Crigler, PA-C   100 mcg at 03/05/23 0735   losartan (COZAAR) tablet 100 mg  100 mg Oral Daily Renne Crigler, PA-C   100 mg at 03/05/23 1010   pregabalin (LYRICA) capsule 25 mg  25 mg Oral Daily Renne Crigler, PA-C   25 mg at 03/05/23 1011   rosuvastatin (CRESTOR) tablet 40 mg  40 mg  Oral Daily Renne Crigler, PA-C   40 mg at 03/05/23 1009   Current Outpatient Medications  Medication Sig Dispense Refill   alendronate (FOSAMAX) 70 MG tablet TAKE 1 TABLET(70 MG) BY MOUTH EVERY 7 DAYS WITH A FULL GLASS OF WATER AND ON AN EMPTY STOMACH 4 tablet 11   benzonatate (TESSALON) 100 MG capsule Take 1 capsule (100 mg total) by mouth 2 (two) times daily as needed for cough. 20 capsule 0   blood glucose meter kit and supplies Dispense based on patient and insurance preference.Once daily testing dx e11.9. 1 each 0   Calcium-Magnesium-Vitamin D (CALCIUM 1200+D3 PO) Take by mouth.     DULoxetine (CYMBALTA) 60 MG capsule TAKE 1 CAPSULE BY MOUTH TWICE DAILY 180 capsule 0   ezetimibe (ZETIA) 10 MG tablet TAKE 1 TABLET(10 MG) BY MOUTH DAILY 90 tablet 1   famotidine (PEPCID) 20 MG tablet TAKE 1 TABLET(20 MG) BY MOUTH TWICE DAILY 180 tablet 1   fenofibrate (TRICOR) 145 MG tablet TAKE 1 TABLET BY MOUTH EVERY DAY 90 tablet 1   isosorbide mononitrate (IMDUR) 30 MG 24 hr tablet TAKE 1 TABLET(30 MG) BY MOUTH AT BEDTIME 90 tablet 3   levothyroxine (SYNTHROID) 100 MCG tablet TAKE 1 TABLET(100 MCG) BY MOUTH DAILY 90 tablet 3   losartan (COZAAR) 100 MG tablet TAKE 1 TABLET BY MOUTH DAILY 90 tablet 1   metoprolol succinate (TOPROL-XL) 50 MG 24 hr tablet Take 1 tablet (50 mg total) by mouth daily. Take with or immediately following a meal. 90 tablet 3   multivitamin-iron-minerals-folic acid (CENTRUM) chewable tablet Chew 1 tablet by mouth daily.     nitroGLYCERIN (NITROSTAT) 0.4 MG SL tablet Place 1 tablet (0.4 mg total) under the tongue every 5 (five) minutes as needed for chest pain. 25 tablet 3   Omega-3 Fatty Acids (FISH OIL) 1200 MG CAPS Take by mouth.     ondansetron (ZOFRAN) 4 MG tablet Take one tablet by mouth once daily, as needed, for nausea 10 tablet 0   oxyCODONE-acetaminophen (PERCOCET/ROXICET) 5-325 MG tablet Take 1 tablet by mouth every 4 (four) hours as needed for severe pain.     pregabalin  (LYRICA) 25 MG capsule Take 1 capsule (25 mg total) by mouth daily. 30 capsule 2   promethazine-dextromethorphan (PROMETHAZINE-DM) 6.25-15 MG/5ML syrup One teaspoon at bedtime, as needed, for excess cough 118 mL 0   rosuvastatin (CRESTOR) 40 MG tablet TAKE 1 TABLET(40 MG) BY MOUTH DAILY 90 tablet 2     Discharge Medications: Please see discharge summary for a list of discharge medications.  Relevant Imaging Results:  Relevant Lab Results:   Additional Information SSN [id number]  Armanda Heritage, RN

## 2023-03-05 NOTE — ED Provider Notes (Signed)
Emergency Medicine Observation Re-evaluation Note  Tammy Suarez is a 77 y.o. female, seen on rounds today.  Pt initially presented to the ED for complaints of Weakness Currently, the patient is resting quietly.  Physical Exam  BP (!) 185/62 (BP Location: Right Arm)   Pulse 93   Temp 98.5 F (36.9 C) (Oral)   Resp 18   Ht  (1.626 m)   Wt 80 kg   SpO2 95%   BMI 30.27 kg/m  Physical Exam General: No acute distress Cardiac: Well-perfused Lungs: Nonlabored   ED Course / MDM  EKG:   I have reviewed the labs performed to date as well as medications administered while in observation.  Recent changes in the last 24 hours include medical evaluation.  Plan  Current plan is for TOC eval for possible placement.   2 PM.  Informed by social work that patient has a bed at [facility].  Nurses to arrange transport.   Terrilee Files, MD 03/05/23 [phone]

## 2023-03-05 NOTE — TOC CM/SW Note (Signed)
Transition of Care [facility]) - Emergency Department Mini Assessment   Patient Details  Name: Tammy Suarez MRN: [id number] Date of Birth: 15-Apr-1946  [facility]) CM/SW Contact:    Armanda Heritage, RN Phone Number: 03/05/2023, 11:18 AM   Clinical Narrative: CM spoke with patient who reports she lives at home with her daughter and that her daughter is now hospitalized and patient doe snot have anyone to help her at home at this this time.  Patient reports she has been contacting people but has been been able to locate assistance and that other family members do not assist her including her grandchildren.   Discussed outcomes of her PT eval and patient is in agreement with short term rehab at [facility] for strengthening so she can return home.    FL2 has been faxed out to area SNF, awaiting bed offers. ED Mini Assessment: What brought you to the Emergency Department? : sent by ems for placement  Barriers to Discharge: No SNF bed  Barrier interventions: FL2 faxed out  Means of departure: Ambulance  Interventions which prevented an admission or readmission: SNF Placement    Patient Contact and Communications     Spoke with: Marvel Plan (patient) Contact Date: 03/05/23,   Contact time: 1039   Call outcome: patient is agreeable for snf search but reports she is also trying to fnd someone to assist her at home until her daughter discharges from the hospital.  Patient states their goals for this hospitalization and ongoing recovery are:: to go to facility CMS Medicare.gov Compare Post Acute Care list provided to:: Patient Choice offered to / list presented to : Patient  Admission diagnosis:  weakness Patient Active Problem List   Diagnosis Date Noted   Acute sinusitis 12/14/2022   Acute bronchitis with COPD 12/14/2022   Nausea 12/14/2022   Chronic pain 12/07/2022   Vitamin D deficiency 11/22/2022   Annual visit for general adult medical examination with abnormal  findings 05/27/2022   Osteoarthritis 01/29/2021   Right shoulder pain 09/18/2020   Positive colorectal cancer screening using Cologuard test 02/12/2020   Constipation 02/12/2020   Closed fracture of proximal end of right humerus 04/29/19 05/15/2019   Fracture of right shoulder 05/02/2019   Carpal tunnel syndrome, left    Benign mole 10/02/2015   Left carpal tunnel syndrome 12/04/2014   Coronary atherosclerosis of native coronary artery 11/02/2013   CKD (chronic kidney disease) stage 3, GFR 30-59 ml/min    DM (diabetes mellitus), type 2 with renal complications 09/15/2013   Recurrent falls 07/28/2013   Urinary incontinence 03/23/2013   Nicotine dependence 11/24/2012   Hypothyroidism 09/28/2010   Depression with anxiety 09/28/2010   FATIGUE 02/08/2009   VITAMIN B12 DEFICIENCY 12/09/2007   Hyperlipidemia with target LDL less than 70 12/09/2007   Obesity (BMI 30.0-34.9) 12/09/2007   Essential hypertension, benign 12/09/2007   GERD 12/09/2007   Spinal stenosis of lumbar region with radiculopathy 12/09/2007   PCP:  Kerri Perches, MD Pharmacy:   [facility] [phone] [place], [place] - [address] AT [facility] [address] [place] [address] Phone: [phone] Fax: [phone]  [id number] [place], [place] - [address] [place] [address] Phone: [phone] Fax: [phone]

## 2023-03-05 NOTE — TOC CM/SW Note (Signed)
CM spoke with Eunice Blase at [facility], patient can admit to room A9 bed 2, bedside RN please call report to [phone].  No further TOC needs identified at this time.  TOC signing off.

## 2023-03-05 NOTE — ED Notes (Signed)
Report called to Lisa, LPN.

## 2023-03-05 NOTE — [place] FL2 (Signed)
[place] MEDICAID FL2 LEVEL OF CARE FORM     IDENTIFICATION  Patient Name: Seville Brick Birthdate: [birth year] Sex: female Admission Date (Current Location): 03/04/2023  [facility] and [place] Number:  [employer] and Address:  [facility],  [address], [place] [address]      Provider Number: [id number]  Attending Physician Name and Address:  Default, Provider, MD  Relative Name and Phone Number:       Current Level of Care: Hospital Recommended Level of Care: Skilled Nursing Facility Prior Approval Number:    Date Approved/Denied:   PASRR Number: [id number]  Discharge Plan: SNF    Current Diagnoses: Patient Active Problem List   Diagnosis Date Noted   Acute sinusitis 12/14/2022   Acute bronchitis with COPD 12/14/2022   Nausea 12/14/2022   Chronic pain 12/07/2022   Vitamin D deficiency 11/22/2022   Annual visit for general adult medical examination with abnormal findings 05/27/2022   Osteoarthritis 01/29/2021   Right shoulder pain 09/18/2020   Positive colorectal cancer screening using Cologuard test 02/12/2020   Constipation 02/12/2020   Closed fracture of proximal end of right humerus 04/29/19 05/15/2019   Fracture of right shoulder 05/02/2019   Carpal tunnel syndrome, left    Benign mole 10/02/2015   Left carpal tunnel syndrome 12/04/2014   Coronary atherosclerosis of native coronary artery 11/02/2013   CKD (chronic kidney disease) stage 3, GFR 30-59 ml/min    DM (diabetes mellitus), type 2 with renal complications 09/15/2013   Recurrent falls 07/28/2013   Urinary incontinence 03/23/2013   Nicotine dependence 11/24/2012   Hypothyroidism 09/28/2010   Depression with anxiety 09/28/2010   FATIGUE 02/08/2009   VITAMIN B12 DEFICIENCY 12/09/2007   Hyperlipidemia with target LDL less than 70 12/09/2007   Obesity (BMI 30.0-34.9) 12/09/2007   Essential hypertension, benign 12/09/2007   GERD 12/09/2007   Spinal stenosis of lumbar region  with radiculopathy 12/09/2007    Orientation RESPIRATION BLADDER Height & Weight     Self, Time, Situation, Place  Normal Continent Weight: 80 kg Height:   (162.6 cm)  BEHAVIORAL SYMPTOMS/MOOD NEUROLOGICAL BOWEL NUTRITION STATUS      Continent Diet  AMBULATORY STATUS COMMUNICATION OF NEEDS Skin   Extensive Assist Verbally Normal                       Personal Care Assistance Level of Assistance  Bathing, Dressing, Total care Bathing Assistance: Limited assistance   Dressing Assistance: Limited assistance Total Care Assistance: Maximum assistance   Functional Limitations Info             SPECIAL CARE FACTORS FREQUENCY  PT (By licensed PT), OT (By licensed OT)     PT Frequency: 5x weekly OT Frequency: 5x weekly            Contractures      Additional Factors Info  Code Status, Allergies Code Status Info: Full             Current Medications (03/05/2023):  This is the current hospital active medication list Current Facility-Administered Medications  Medication Dose Route Frequency Provider Last Rate Last Admin   DULoxetine (CYMBALTA) DR capsule 60 mg  60 mg Oral BID Renne Crigler, PA-C   60 mg at 03/05/23 1010   ezetimibe (ZETIA) tablet 10 mg  10 mg Oral Daily Renne Crigler, PA-C   10 mg at 03/05/23 1010   famotidine (PEPCID) tablet 10 mg  10 mg Oral BID Renne Crigler, PA-C  10 mg at 03/05/23 1011   fenofibrate tablet 160 mg  160 mg Oral Daily Renne Crigler, PA-C   160 mg at 03/05/23 1011   isosorbide mononitrate (IMDUR) 24 hr tablet 30 mg  30 mg Oral Daily Renne Crigler, PA-C   30 mg at 03/05/23 1009   levothyroxine (SYNTHROID) tablet 100 mcg  100 mcg Oral Q0600 Renne Crigler, PA-C   100 mcg at 03/05/23 0735   losartan (COZAAR) tablet 100 mg  100 mg Oral Daily Renne Crigler, PA-C   100 mg at 03/05/23 1010   pregabalin (LYRICA) capsule 25 mg  25 mg Oral Daily Renne Crigler, PA-C   25 mg at 03/05/23 1011   rosuvastatin (CRESTOR) tablet 40 mg   40 mg Oral Daily Renne Crigler, PA-C   40 mg at 03/05/23 1009   Current Outpatient Medications  Medication Sig Dispense Refill   alendronate (FOSAMAX) 70 MG tablet TAKE 1 TABLET(70 MG) BY MOUTH EVERY 7 DAYS WITH A FULL GLASS OF WATER AND ON AN EMPTY STOMACH 4 tablet 11   benzonatate (TESSALON) 100 MG capsule Take 1 capsule (100 mg total) by mouth 2 (two) times daily as needed for cough. 20 capsule 0   blood glucose meter kit and supplies Dispense based on patient and insurance preference.Once daily testing dx e11.9. 1 each 0   Calcium-Magnesium-Vitamin D (CALCIUM 1200+D3 PO) Take by mouth.     DULoxetine (CYMBALTA) 60 MG capsule TAKE 1 CAPSULE BY MOUTH TWICE DAILY 180 capsule 0   ezetimibe (ZETIA) 10 MG tablet TAKE 1 TABLET(10 MG) BY MOUTH DAILY 90 tablet 1   famotidine (PEPCID) 20 MG tablet TAKE 1 TABLET(20 MG) BY MOUTH TWICE DAILY 180 tablet 1   fenofibrate (TRICOR) 145 MG tablet TAKE 1 TABLET BY MOUTH EVERY DAY 90 tablet 1   isosorbide mononitrate (IMDUR) 30 MG 24 hr tablet TAKE 1 TABLET(30 MG) BY MOUTH AT BEDTIME 90 tablet 3   levothyroxine (SYNTHROID) 100 MCG tablet TAKE 1 TABLET(100 MCG) BY MOUTH DAILY 90 tablet 3   losartan (COZAAR) 100 MG tablet TAKE 1 TABLET BY MOUTH DAILY 90 tablet 1   metoprolol succinate (TOPROL-XL) 50 MG 24 hr tablet Take 1 tablet (50 mg total) by mouth daily. Take with or immediately following a meal. 90 tablet 3   multivitamin-iron-minerals-folic acid (CENTRUM) chewable tablet Chew 1 tablet by mouth daily.     nitroGLYCERIN (NITROSTAT) 0.4 MG SL tablet Place 1 tablet (0.4 mg total) under the tongue every 5 (five) minutes as needed for chest pain. 25 tablet 3   Omega-3 Fatty Acids (FISH OIL) 1200 MG CAPS Take by mouth.     ondansetron (ZOFRAN) 4 MG tablet Take one tablet by mouth once daily, as needed, for nausea 10 tablet 0   oxyCODONE-acetaminophen (PERCOCET/ROXICET) 5-325 MG tablet Take 1 tablet by mouth every 4 (four) hours as needed for severe pain.      pregabalin (LYRICA) 25 MG capsule Take 1 capsule (25 mg total) by mouth daily. 30 capsule 2   promethazine-dextromethorphan (PROMETHAZINE-DM) 6.25-15 MG/5ML syrup One teaspoon at bedtime, as needed, for excess cough 118 mL 0   rosuvastatin (CRESTOR) 40 MG tablet TAKE 1 TABLET(40 MG) BY MOUTH DAILY 90 tablet 2     Discharge Medications: Please see discharge summary for a list of discharge medications.  Relevant Imaging Results:  Relevant Lab Results:   Additional Information SSN [id number]  Armanda Heritage, RN

## 2023-03-05 NOTE — ED Notes (Signed)
Attempted to call report. Transport ordered.

## 2023-03-05 NOTE — Plan of Care (Signed)
  Problem: Acute Rehab PT Goals(only PT should resolve) Goal: Pt Will Go Supine/Side To Sit Outcome: Progressing Flowsheets (Taken 03/05/2023 1127) Pt will go Supine/Side to Sit:  with min guard assist  with minimal assist Goal: Patient Will Transfer Sit To/From Stand Outcome: Progressing Flowsheets (Taken 03/05/2023 1127) Patient will transfer sit to/from stand:  with min guard assist  with minimal assist Goal: Pt Will Transfer Bed To Chair/Chair To Bed Outcome: Progressing Flowsheets (Taken 03/05/2023 1127) Pt will Transfer Bed to Chair/Chair to Bed:  min guard assist  with min assist Goal: Pt Will Ambulate Outcome: Progressing Flowsheets (Taken 03/05/2023 1127) Pt will Ambulate:  25 feet  with minimal assist  with rolling walker   11:27 AM, 03/05/23 Ocie Bob, MPT Physical Therapist with [facility] [phone] office [phone] mobile phone

## 2023-03-05 NOTE — TOC CM/SW Note (Signed)
Insurance Berkley Harvey has been approved from 03/05/23-03/09/23, auth ID [id number], next review date 03/09/23.  Cm spoke with admissions rep Debbie at [facility] who reports they can accept patient today, she will notify this CM as soon as a bed has been assigned.

## 2023-03-05 NOTE — Telephone Encounter (Signed)
FYI Patient daughter [name] called said patient is at the ER now since last night and said will hold her for 4 days until gets care taken care of and get help at home. And the daughter [name] is also in the hospital for congestion heart failure.

## 2023-03-05 NOTE — ED Notes (Signed)
Pt is tearful after speaking with daughter about care for herself outside of the hospital. Daughter is currently admitted to the hospital here at [facility]. RN called daughter for pt at the request from the nurse upstairs.

## 2023-03-05 NOTE — TOC CM/SW Note (Signed)
CM met with patient at bedside and provided bed offers.  Patient requested to contact daughter and friend to make one more attempt to secure assistance in the home.  CM assisted patient to dial phone numbers for both and neither answered call, patient declined to leave a VM.    CM went over bed offers and provided CMS ratings.  Patient selects [facility].  Insurance Berkley Harvey has been initiated.

## 2023-03-05 NOTE — Evaluation (Signed)
Physical Therapy Evaluation Patient Details Name: Tammy Suarez MRN: [id number] DOB: 01-18-1946 Today's Date: 03/05/2023  History of Present Illness  Tammy Suarez is a 77 y.o. female.     Patient with admission at [facility] from 02/23/2023 to 03/03/2023 for acute hypoxic respiratory failure 2/2 CAP and type 2 NSTEMI, additionally was found to have RUL mass-like consolidation, concerning for malignancy.  She is living at home with her daughter, who was admitted to the hospital for congestive heart failure about 2 days ago.  Patient was brought home from the hospital yesterday and she does not have anyone to help care for her.  She has limited ambulation with a walker but typically needs a lot of help.  She reportedly "uses a pad" and cannot get herself up to the restroom.  Daughter called the ambulance today as she is in the hospital.  Patient complains of generalized weakness.  She states that her breathing is overall improved from her recent hospitalization.  She denies pain or fevers.   Clinical Impression  Patient demonstrates slow labored movement for sitting up at bedside, very unsteady on feet with near fall during transfer to chair due to buckling of knees and limited to taking a few steps in room due to c/o fatigue and generalized weakness.  Patient tolerated sitting up in chair after therapy - nursing staff aware.  Patient will benefit from continued skilled physical therapy in hospital and recommended venue below to increase strength, balance, endurance for safe ADLs and gait.          Recommendations for follow up therapy are one component of a multi-disciplinary discharge planning process, led by the attending physician.  Recommendations may be updated based on patient status, additional functional criteria and insurance authorization.  Follow Up Recommendations Can patient physically be transported by private vehicle: Yes     Assistance Recommended at Discharge Set up  Supervision/Assistance  Patient can return home with the following  A lot of help with walking and/or transfers;A lot of help with bathing/dressing/bathroom;Assistance with cooking/housework;Help with stairs or ramp for entrance    Equipment Recommendations None recommended by PT  Recommendations for Other Services       Functional Status Assessment Patient has had a recent decline in their functional status and demonstrates the ability to make significant improvements in function in a reasonable and predictable amount of time.     Precautions / Restrictions Precautions Precautions: Fall Restrictions Weight Bearing Restrictions: No      Mobility  Bed Mobility Overal bed mobility: Needs Assistance Bed Mobility: Supine to Sit     Supine to sit: Min assist, Mod assist     General bed mobility comments: slow labored movement with HOB raised    Transfers Overall transfer level: Needs assistance Equipment used: Rolling walker (2 wheels) Transfers: Sit to/from Stand, Bed to chair/wheelchair/BSC Sit to Stand: Min assist, Mod assist   Step pivot transfers: Min assist, Mod assist       General transfer comment: unsteady labored movement    Ambulation/Gait Ambulation/Gait assistance: Min assist, Mod assist Gait Distance (Feet): 15 Feet Assistive device: Rolling walker (2 wheels) Gait Pattern/deviations: Decreased step length - left, Decreased step length - right, Decreased stride length, Knees buckling, Trunk flexed Gait velocity: slow     General Gait Details: slow labored cadence with occasional buckling of knees due to weakness and limited mostly due to c/o fatigue  Careers information officer  Modified Rankin (Stroke Patients Only)       Balance Overall balance assessment: Needs assistance Sitting-balance support: Feet supported, No upper extremity supported Sitting balance-Leahy Scale: Fair Sitting balance - Comments: seated at EOB    Standing balance support: Reliant on assistive device for balance, During functional activity, Bilateral upper extremity supported Standing balance-Leahy Scale: Poor Standing balance comment: fair/poor using RW                             Pertinent Vitals/Pain Pain Assessment Pain Assessment: No/denies pain    Home Living Family/patient expects to be discharged to:: Private residence Living Arrangements: Children Available Help at Discharge: Family;Available PRN/intermittently Type of Home: House Home Access: Ramped entrance       Home Layout: One level Home Equipment: Agricultural consultant (2 wheels);Shower seat;Grab bars - tub/shower;BSC/3in1      Prior Function Prior Level of Function : Needs assist       Physical Assist : Mobility (physical) Mobility (physical): Bed mobility;Transfers;Gait;Stairs   Mobility Comments: household ambulator using RW ADLs Comments: Assisted by family     Hand Dominance        Extremity/Trunk Assessment   Upper Extremity Assessment Upper Extremity Assessment: Generalized weakness    Lower Extremity Assessment Lower Extremity Assessment: Generalized weakness    Cervical / Trunk Assessment Cervical / Trunk Assessment: Normal  Communication   Communication: No difficulties  Cognition Arousal/Alertness: Awake/alert Behavior During Therapy: WFL for tasks assessed/performed Overall Cognitive Status: Within Functional Limits for tasks assessed                                          General Comments      Exercises     Assessment/Plan    PT Assessment Patient needs continued PT services  PT Problem List Decreased strength;Decreased activity tolerance;Decreased balance;Decreased mobility       PT Treatment Interventions DME instruction;Gait training;Stair training;Functional mobility training;Therapeutic activities;Therapeutic exercise;Balance training;Patient/family education    PT Goals (Current  goals can be found in the Care Plan section)  Acute Rehab PT Goals Patient Stated Goal: return home after rehab PT Goal Formulation: With patient Time For Goal Achievement: 03/26/23 Potential to Achieve Goals: Good    Frequency Min 2X/week     Co-evaluation               AM-PAC PT "6 Clicks" Mobility  Outcome Measure Help needed turning from your back to your side while in a flat bed without using bedrails?: A Little Help needed moving from lying on your back to sitting on the side of a flat bed without using bedrails?: A Lot Help needed moving to and from a bed to a chair (including a wheelchair)?: A Lot Help needed standing up from a chair using your arms (e.g., wheelchair or bedside chair)?: A Little Help needed to walk in hospital room?: A Lot Help needed climbing 3-5 steps with a railing? : A Lot 6 Click Score: 14    End of Session   Activity Tolerance: Patient tolerated treatment well;Patient limited by fatigue Patient left: in chair;with call bell/phone within reach Nurse Communication: Mobility status PT Visit Diagnosis: Unsteadiness on feet (R26.81);Other abnormalities of gait and mobility (R26.89);Muscle weakness (generalized) (M62.81)    Time: 1610-9604 PT Time Calculation (min) (ACUTE ONLY): 22 min   Charges:   PT Evaluation $  PT Eval Moderate Complexity: 1 Mod PT Treatments $Therapeutic Activity: 8-22 mins        11:26 AM, 03/05/23 Ocie Bob, MPT Physical Therapist with [facility] [phone] office [phone] mobile phone

## 2023-03-05 NOTE — ED Notes (Signed)
Gown and linen change. Pt in chair with dinner and call bell.

## 2023-03-05 NOTE — ED Notes (Signed)
Pt care taken, pt is sitting at bed table with dinner tray, linen on bed was changed. No complaints at this time.

## 2023-03-06 DIAGNOSIS — G8929 Other chronic pain: Secondary | ICD-10-CM | POA: Diagnosis not present

## 2023-03-06 DIAGNOSIS — I509 Heart failure, unspecified: Secondary | ICD-10-CM | POA: Diagnosis not present

## 2023-03-06 DIAGNOSIS — M810 Age-related osteoporosis without current pathological fracture: Secondary | ICD-10-CM | POA: Diagnosis not present

## 2023-03-06 DIAGNOSIS — M6281 Muscle weakness (generalized): Secondary | ICD-10-CM | POA: Diagnosis not present

## 2023-03-06 DIAGNOSIS — E039 Hypothyroidism, unspecified: Secondary | ICD-10-CM | POA: Diagnosis not present

## 2023-03-06 DIAGNOSIS — I214 Non-ST elevation (NSTEMI) myocardial infarction: Secondary | ICD-10-CM | POA: Diagnosis not present

## 2023-03-06 DIAGNOSIS — F418 Other specified anxiety disorders: Secondary | ICD-10-CM | POA: Diagnosis not present

## 2023-03-06 DIAGNOSIS — I10 Essential (primary) hypertension: Secondary | ICD-10-CM | POA: Diagnosis not present

## 2023-03-06 DIAGNOSIS — G629 Polyneuropathy, unspecified: Secondary | ICD-10-CM | POA: Diagnosis not present

## 2023-03-06 DIAGNOSIS — K219 Gastro-esophageal reflux disease without esophagitis: Secondary | ICD-10-CM | POA: Diagnosis not present

## 2023-03-06 DIAGNOSIS — E785 Hyperlipidemia, unspecified: Secondary | ICD-10-CM | POA: Diagnosis not present

## 2023-03-06 DIAGNOSIS — E1129 Type 2 diabetes mellitus with other diabetic kidney complication: Secondary | ICD-10-CM | POA: Diagnosis not present

## 2023-03-06 DIAGNOSIS — F32A Depression, unspecified: Secondary | ICD-10-CM | POA: Diagnosis not present

## 2023-03-08 DIAGNOSIS — E785 Hyperlipidemia, unspecified: Secondary | ICD-10-CM | POA: Diagnosis not present

## 2023-03-08 DIAGNOSIS — M810 Age-related osteoporosis without current pathological fracture: Secondary | ICD-10-CM | POA: Diagnosis not present

## 2023-03-08 DIAGNOSIS — I10 Essential (primary) hypertension: Secondary | ICD-10-CM | POA: Diagnosis not present

## 2023-03-08 DIAGNOSIS — I509 Heart failure, unspecified: Secondary | ICD-10-CM | POA: Diagnosis not present

## 2023-03-08 DIAGNOSIS — M6281 Muscle weakness (generalized): Secondary | ICD-10-CM | POA: Diagnosis not present

## 2023-03-10 DIAGNOSIS — E785 Hyperlipidemia, unspecified: Secondary | ICD-10-CM | POA: Diagnosis not present

## 2023-03-10 DIAGNOSIS — G8929 Other chronic pain: Secondary | ICD-10-CM | POA: Diagnosis not present

## 2023-03-10 DIAGNOSIS — M810 Age-related osteoporosis without current pathological fracture: Secondary | ICD-10-CM | POA: Diagnosis not present

## 2023-03-10 DIAGNOSIS — E039 Hypothyroidism, unspecified: Secondary | ICD-10-CM | POA: Diagnosis not present

## 2023-03-10 DIAGNOSIS — I214 Non-ST elevation (NSTEMI) myocardial infarction: Secondary | ICD-10-CM | POA: Diagnosis not present

## 2023-03-10 DIAGNOSIS — G629 Polyneuropathy, unspecified: Secondary | ICD-10-CM | POA: Diagnosis not present

## 2023-03-10 DIAGNOSIS — I10 Essential (primary) hypertension: Secondary | ICD-10-CM | POA: Diagnosis not present

## 2023-03-10 DIAGNOSIS — M6281 Muscle weakness (generalized): Secondary | ICD-10-CM | POA: Diagnosis not present

## 2023-03-19 ENCOUNTER — Telehealth: Payer: Self-pay | Admitting: Family Medicine

## 2023-03-19 NOTE — Telephone Encounter (Signed)
Tammy Suarez Social worker called for nurse to speak to nurse [phone] call before 10 am if possible. Questions about patient care stated she needed if provider could do a referral to stay at home.

## 2023-03-22 NOTE — Telephone Encounter (Signed)
Social worker calling back.

## 2023-03-23 NOTE — Telephone Encounter (Signed)
LMTRC-KG 

## 2023-03-25 ENCOUNTER — Telehealth: Payer: Self-pay | Admitting: Family Medicine

## 2023-03-25 NOTE — Telephone Encounter (Signed)
Patient daughter called needs referral setup for home health, her has expired. Call back # [phone] or Selena Batten (daughter) at [phone]

## 2023-03-26 ENCOUNTER — Ambulatory Visit: Payer: Medicare PPO | Admitting: Nurse Practitioner

## 2023-03-30 ENCOUNTER — Encounter: Payer: Self-pay | Admitting: *Deleted

## 2023-03-31 ENCOUNTER — Other Ambulatory Visit: Payer: Self-pay

## 2023-03-31 DIAGNOSIS — T733XXD Exhaustion due to excessive exertion, subsequent encounter: Secondary | ICD-10-CM

## 2023-03-31 NOTE — Telephone Encounter (Signed)
Referral placed.

## 2023-04-05 ENCOUNTER — Telehealth: Payer: Self-pay | Admitting: Family Medicine

## 2023-04-05 ENCOUNTER — Other Ambulatory Visit: Payer: Self-pay | Admitting: Family Medicine

## 2023-04-05 NOTE — Telephone Encounter (Signed)
She has not been seen since Jan 2024. Must have had face to face visit with provider within 90 days for home health referral.

## 2023-04-05 NOTE — Telephone Encounter (Signed)
Celine Mans called Social worker is needing something to stay at home for patient Call back # [phone] ext [phone]

## 2023-04-05 NOTE — Telephone Encounter (Signed)
Pt called in regard to home health. Pt has not heard anything in regard. Wants a cll back

## 2023-04-06 NOTE — Telephone Encounter (Signed)
Pt will wait for upcoming appt for referral.

## 2023-04-06 NOTE — Telephone Encounter (Signed)
LMTRC-KG 

## 2023-04-20 DIAGNOSIS — M542 Cervicalgia: Secondary | ICD-10-CM | POA: Diagnosis not present

## 2023-04-20 DIAGNOSIS — G8929 Other chronic pain: Secondary | ICD-10-CM | POA: Diagnosis not present

## 2023-04-20 DIAGNOSIS — Z79891 Long term (current) use of opiate analgesic: Secondary | ICD-10-CM | POA: Diagnosis not present

## 2023-04-20 DIAGNOSIS — M5412 Radiculopathy, cervical region: Secondary | ICD-10-CM | POA: Diagnosis not present

## 2023-04-20 DIAGNOSIS — M545 Low back pain, unspecified: Secondary | ICD-10-CM | POA: Diagnosis not present

## 2023-04-20 DIAGNOSIS — M25562 Pain in left knee: Secondary | ICD-10-CM | POA: Diagnosis not present

## 2023-04-20 DIAGNOSIS — M25561 Pain in right knee: Secondary | ICD-10-CM | POA: Diagnosis not present

## 2023-04-20 DIAGNOSIS — Z79899 Other long term (current) drug therapy: Secondary | ICD-10-CM | POA: Diagnosis not present

## 2023-04-20 DIAGNOSIS — G894 Chronic pain syndrome: Secondary | ICD-10-CM | POA: Diagnosis not present

## 2023-04-22 ENCOUNTER — Ambulatory Visit (INDEPENDENT_AMBULATORY_CARE_PROVIDER_SITE_OTHER): Payer: Medicare HMO

## 2023-04-22 DIAGNOSIS — Z0000 Encounter for general adult medical examination without abnormal findings: Secondary | ICD-10-CM

## 2023-04-22 NOTE — Progress Notes (Signed)
Subjective:   Tammy Suarez is a 77 y.o. female who presents for Medicare Annual (Subsequent) preventive examination.  Review of Systems    I connected with  [employer] on 04/22/23 by a audio enabled telemedicine application and verified that I am speaking with the correct person using two identifiers.  Patient Location: Home  Provider Location: Office/Clinic  I discussed the limitations of evaluation and management by telemedicine. The patient expressed understanding and agreed to proceed.        Objective:    There were no vitals filed for this visit. There is no height or weight on file to calculate BMI.     03/04/2023    3:59 PM 05/27/2022   12:16 PM 07/18/2021    4:51 PM 04/29/2020   11:21 AM 04/25/2020    1:36 PM 10/04/2019    6:32 PM 09/07/2019    3:55 PM  Advanced Directives  Does Patient Have a Medical Advance Directive? No No No No No No No  Would patient like information on creating a medical advance directive? No - Patient declined Yes (ED - Information included in AVS)  No - Patient declined No - Patient declined No - Patient declined     Current Medications (verified) Outpatient Encounter Medications as of 04/22/2023  Medication Sig   alendronate (FOSAMAX) 70 MG tablet TAKE 1 TABLET(70 MG) BY MOUTH EVERY 7 DAYS WITH A FULL GLASS OF WATER AND ON AN EMPTY STOMACH   benzonatate (TESSALON) 100 MG capsule Take 1 capsule (100 mg total) by mouth 2 (two) times daily as needed for cough.   blood glucose meter kit and supplies Dispense based on patient and insurance preference.Once daily testing dx e11.9.   Calcium-Magnesium-Vitamin D (CALCIUM 1200+D3 PO) Take by mouth.   DULoxetine (CYMBALTA) 60 MG capsule TAKE 1 CAPSULE BY MOUTH TWICE DAILY   ezetimibe (ZETIA) 10 MG tablet TAKE 1 TABLET(10 MG) BY MOUTH DAILY   famotidine (PEPCID) 20 MG tablet TAKE 1 TABLET(20 MG) BY MOUTH TWICE DAILY   fenofibrate (TRICOR) 145 MG tablet TAKE 1 TABLET BY MOUTH EVERY DAY   isosorbide  mononitrate (IMDUR) 30 MG 24 hr tablet TAKE 1 TABLET(30 MG) BY MOUTH AT BEDTIME   levothyroxine (SYNTHROID) 100 MCG tablet TAKE 1 TABLET(100 MCG) BY MOUTH DAILY   losartan (COZAAR) 100 MG tablet TAKE 1 TABLET BY MOUTH DAILY   metoprolol succinate (TOPROL-XL) 50 MG 24 hr tablet Take 1 tablet (50 mg total) by mouth daily. Take with or immediately following a meal.   multivitamin-iron-minerals-folic acid (CENTRUM) chewable tablet Chew 1 tablet by mouth daily.   nitroGLYCERIN (NITROSTAT) 0.4 MG SL tablet Place 1 tablet (0.4 mg total) under the tongue every 5 (five) minutes as needed for chest pain.   Omega-3 Fatty Acids (FISH OIL) 1200 MG CAPS Take by mouth.   ondansetron (ZOFRAN) 4 MG tablet Take one tablet by mouth once daily, as needed, for nausea   oxyCODONE-acetaminophen (PERCOCET/ROXICET) 5-325 MG tablet Take 1 tablet by mouth every 4 (four) hours as needed for severe pain.   pregabalin (LYRICA) 25 MG capsule Take 1 capsule (25 mg total) by mouth daily.   promethazine-dextromethorphan (PROMETHAZINE-DM) 6.25-15 MG/5ML syrup One teaspoon at bedtime, as needed, for excess cough   rosuvastatin (CRESTOR) 40 MG tablet TAKE 1 TABLET(40 MG) BY MOUTH DAILY   No facility-administered encounter medications on file as of 04/22/2023.    Allergies (verified) Gabapentin, Meperidine hcl, Niacin, Propoxyphene n-acetaminophen, and Hydrocodone-acetaminophen   History: Past Medical History:  Diagnosis  Date   Arthritis    Atrial fibrillation (HCC)    Carpal tunnel syndrome    CHF (congestive heart failure) (HCC)    Chronic back pain    CKD (chronic kidney disease) stage 3, GFR 30-59 ml/min (HCC)    Coronary atherosclerosis of native coronary artery    Multivessel status post CABG - LIMA to LAD, SVG to OM and SVG to PDA   Depression    Economic stress    Essential hypertension    GERD (gastroesophageal reflux disease)    Headache    History of conjunctivitis    History of hiatal hernia     Hyperlipidemia    Hypothyroidism    Myocardial infarction (HCC) 1999   Obesity    Type 2 diabetes mellitus (HCC)    Uterine cancer (HCC)    Vitamin B 12 deficiency    Past Surgical History:  Procedure Laterality Date   ABDOMINAL HYSTERECTOMY     BACK SURGERY     CARDIAC SURGERY     CARPAL TUNNEL RELEASE Left 07/23/2016   Procedure: CARPAL TUNNEL RELEASE;  Surgeon: Vickki Hearing, MD;  Location: [facility];  Service: Orthopedics;  Laterality: Left;   CERVICAL LAMINECTOMY     CHOLECYSTECTOMY     COLONOSCOPY  2007   Dr. Elpidio Anis: reported to be normal   COLONOSCOPY WITH PROPOFOL N/A 04/29/2020   Procedure: COLONOSCOPY WITH PROPOFOL;  Surgeon: Corbin Ade, MD;  Location: [facility];  Service: Endoscopy;  Laterality: N/A;  12:45pm   CORONARY ARTERY BYPASS GRAFT N/A 11/02/2013   Procedure: CORONARY ARTERY BYPASS GRAFTING (CABG);  Surgeon: Alleen Borne, MD;  Location: [facility] OR;  Service: Open Heart Surgery;  Laterality: N/A;  CABG x three, using left internal mammary artery and right leg greater saphenous vein harvested endoscopically   COSMETIC SURGERY  07/01/2012   Recurrent skin infection on lower abdomen, [facility]   HERNIA REPAIR     Inguinal herniorrhaphy     INTRAOPERATIVE TRANSESOPHAGEAL ECHOCARDIOGRAM N/A 11/02/2013   Procedure: INTRAOPERATIVE TRANSESOPHAGEAL ECHOCARDIOGRAM;  Surgeon: Alleen Borne, MD;  Location: [facility] OR;  Service: Open Heart Surgery;  Laterality: N/A;   LAPAROSCOPIC GASTRIC BANDING  2009   LEFT HEART CATHETERIZATION WITH CORONARY ANGIOGRAM N/A 10/30/2013   Procedure: LEFT HEART CATHETERIZATION WITH CORONARY ANGIOGRAM;  Surgeon: Rollene Rotunda, MD;  Location: [facility] CATH LAB;  Service: Cardiovascular;  Laterality: N/A;   Left index finger repair     LUMBAR FUSION     POLYPECTOMY  04/29/2020   Procedure: POLYPECTOMY;  Surgeon: Corbin Ade, MD;  Location: [facility];  Service: Endoscopy;;   SPINE SURGERY     VESICOVAGINAL FISTULA CLOSURE W/ TAH     Family  History  Problem Relation Age of Onset   Heart attack Mother    Heart attack Father    Hypertension Sister    Heart attack Sister    Cancer Brother        Renal cell    Hepatitis Daughter    Drug abuse Daughter    Hypertension Daughter    Colon cancer Neg Hx    Social History   Socioeconomic History   Marital status: Single    Spouse name: Not on file   Number of children: 3   Years of education: Not on file   Highest education level: GED or equivalent  Occupational History   Not on file  Tobacco Use   Smoking status: Every Day    Packs/day: 0.25  Years: 50.00    Additional pack years: 0.00    Total pack years: 12.50    Types: Cigarettes   Smokeless tobacco: Never   Tobacco comments:    smokes about 3 a day   Vaping Use   Vaping Use: Never used  Substance and Sexual Activity   Alcohol use: No    Alcohol/week: 0.0 standard drinks of alcohol   Drug use: No   Sexual activity: Never    Birth control/protection: Surgical  Other Topics Concern   Not on file  Social History Narrative   Not on file   Social Determinants of Health   Financial Resource Strain: Low Risk  (07/18/2021)   Overall Financial Resource Strain (CARDIA)    Difficulty of Paying Living Expenses: Not hard at all  Food Insecurity: No Food Insecurity (07/18/2021)   Hunger Vital Sign    Worried About Running Out of Food in the Last Year: Never true    Ran Out of Food in the Last Year: Never true  Transportation Needs: No Transportation Needs (07/18/2021)   PRAPARE - Administrator, Civil Service (Medical): No    Lack of Transportation (Non-Medical): No  Physical Activity: Inactive (07/18/2021)   Exercise Vital Sign    Days of Exercise per Week: 0 days    Minutes of Exercise per Session: 0 min  Stress: No Stress Concern Present (07/18/2021)   [employer] of Occupational Health - Occupational Stress Questionnaire    Feeling of Stress : Only a little  Social Connections: Socially  Isolated (07/18/2021)   Social Connection and Isolation Panel [NHANES]    Frequency of Communication with Friends and Family: Three times a week    Frequency of Social Gatherings with Friends and Family: Three times a week    Attends Religious Services: Never    Active Member of Clubs or Organizations: No    Attends Banker Meetings: Never    Marital Status: Widowed    Tobacco Counseling Ready to quit: Not Answered Counseling given: Not Answered Tobacco comments: smokes about 3 a day    Clinical Intake:     Diabetic?no       Activities of Daily Living     No data to display           Patient Care Team: Kerri Perches, MD as PCP - General Diona Browner Illene Bolus, MD as PCP - Cardiology (Cardiology) Julio Sicks, MD as Consulting Physician (Neurosurgery) Vickki Hearing, MD as Consulting Physician (Orthopedic Surgery) Beryle Beams, MD as Consulting Physician (Neurology) Jena Gauss Gerrit Friends, MD as Consulting Physician (Gastroenterology)  Indicate any recent Medical Services you may have received from other than [facility] providers in the past year (date may be approximate).     Assessment:   This is a routine wellness examination for Si.  Hearing/Vision screen No results found.  Dietary issues and exercise activities discussed:     Goals Addressed   None   Depression Screen    12/04/2022    2:42 PM 05/27/2022   11:04 AM 05/27/2022   10:47 AM 12/30/2021    1:49 PM 11/07/2021   10:04 AM 07/18/2021    4:47 PM 05/13/2021   10:58 AM  PHQ 2/9 Scores  PHQ - 2 Score 0 5 0 0 3 1 6   PHQ- 9 Score 16 12   7  18     Fall Risk    12/04/2022    2:42 PM 05/27/2022   10:46 AM 12/30/2021  1:49 PM 11/07/2021   10:04 AM 07/18/2021    4:51 PM  Fall Risk   Falls in the past year? 1 1 0 0 0  Number falls in past yr: 1 0 0 0 0  Injury with Fall? 0 0 0 0 0  Risk for fall due to : No Fall Risks Impaired balance/gait No Fall Risks  Impaired balance/gait  Follow up  Falls evaluation completed Falls evaluation completed Falls evaluation completed  Falls evaluation completed    FALL RISK PREVENTION PERTAINING TO THE HOME:  Any stairs in or around the home? Yes  If so, are there any without handrails? No  Home free of loose throw rugs in walkways, pet beds, electrical cords, etc? Yes  Adequate lighting in your home to reduce risk of falls? Yes   ASSISTIVE DEVICES UTILIZED TO PREVENT FALLS:  Life alert? No  Use of a cane, walker or w/c? Yes  Grab bars in the bathroom? Yes  Shower chair or bench in shower? No  Elevated toilet seat or a handicapped toilet? Yes         07/18/2021    4:56 PM 04/09/2020    9:57 AM 04/05/2019    1:49 PM 07/05/2017    3:46 PM  6CIT Screen  What Year? 0 points 0 points 0 points 0 points  What month? 0 points 0 points 0 points 0 points  What time? 0 points 0 points 0 points 0 points  Count back from 20 0 points 0 points 0 points 0 points  Months in reverse 0 points 0 points 0 points 0 points  Repeat phrase 10 points 4 points 4 points 0 points  Total Score 10 points 4 points 4 points 0 points    Immunizations Immunization History  Administered Date(s) Administered   Fluad Quad(high Dose 65+) 08/24/2019, 10/04/2020, 12/30/2021, 12/04/2022   Influenza Split 10/20/2011, 08/08/2012   Influenza Whole 09/23/2009, 09/26/2010   Influenza,inj,Quad PF,6+ Mos 07/28/2013, 09/03/2014, 10/02/2015, 09/09/2016, 09/27/2017, 08/02/2018   PFIZER(Purple Top)SARS-COV-2 Vaccination 05/29/2020, 07/22/2020   Pneumococcal Conjugate-13 01/30/2015   Pneumococcal Polysaccharide-23 01/30/2011   Td 06/13/2010   Zoster, Live 12/09/2015    TDAP status: Due, Education has been provided regarding the importance of this vaccine. Advised may receive this vaccine at local pharmacy or Health Dept. Aware to provide a copy of the vaccination record if obtained from local pharmacy or Health Dept. Verbalized acceptance and understanding.  Flu Vaccine  status: Up to date  Pneumococcal vaccine status: Up to date  Covid-19 vaccine status: Completed vaccines  Qualifies for Shingles Vaccine? Yes   Zostavax completed No   Shingrix Completed?: No.    Education has been provided regarding the importance of this vaccine. Patient has been advised to call insurance company to determine out of pocket expense if they have not yet received this vaccine. Advised may also receive vaccine at local pharmacy or Health Dept. Verbalized acceptance and understanding.  Screening Tests Health Maintenance  Topic Date Due   Zoster Vaccines- Shingrix (1 of 2) Never done   DTaP/Tdap/Td (2 - Tdap) 06/13/2020   COVID-19 Vaccine (3 - Pfizer risk series) 08/19/2020   OPHTHALMOLOGY EXAM  05/13/2022   HEMOGLOBIN A1C  05/27/2023   Diabetic kidney evaluation - Urine ACR  05/28/2023   FOOT EXAM  05/28/2023   INFLUENZA VACCINE  06/17/2023   Diabetic kidney evaluation - eGFR measurement  03/03/2024   Medicare Annual Wellness (AWV)  04/21/2024   Pneumonia Vaccine 23+ Years old  Completed  DEXA SCAN  Completed   Hepatitis C Screening  Completed   HPV VACCINES  Aged Out   Colonoscopy  Discontinued    Health Maintenance  Health Maintenance Due  Topic Date Due   Zoster Vaccines- Shingrix (1 of 2) Never done   DTaP/Tdap/Td (2 - Tdap) 06/13/2020   COVID-19 Vaccine (3 - Pfizer risk series) 08/19/2020   OPHTHALMOLOGY EXAM  05/13/2022    Colorectal cancer screening: No longer required.   Mammogram status: No longer required due to age.  Bone Density status: Completed 09/06/2019. Results reflect: Bone density results: NORMAL. Repeat every 2 years.  Lung Cancer Screening: (Low Dose CT Chest recommended if Age 61-80 years, 30 pack-year currently smoking OR have quit w/in 15years.) does qualify.   Lung Cancer Screening Referral: yes  Additional Screening:  Hepatitis C Screening: does qualify; Completed 01/07/16  Vision Screening: Recommended annual  ophthalmology exams for early detection of glaucoma and other disorders of the eye. Is the patient up to date with their annual eye exam?  Yes  Who is the provider or what is the name of the office in which the patient attends annual eye exams? Dr patty If pt is not established with a provider, would they like to be referred to a provider to establish care? No .   Dental Screening: Recommended annual dental exams for proper oral hygiene  Community Resource Referral / Chronic Care Management: CRR required this visit?  No   CCM required this visit?  No      Plan:     I have personally reviewed and noted the following in the patient's chart:   Medical and social history Use of alcohol, tobacco or illicit drugs  Current medications and supplements including opioid prescriptions. Patient is not currently taking opioid prescriptions. Functional ability and status Nutritional status Physical activity Advanced directives List of other physicians Hospitalizations, surgeries, and ER visits in previous 12 months Vitals Screenings to include cognitive, depression, and falls Referrals and appointments  In addition, I have reviewed and discussed with patient certain preventive protocols, quality metrics, and best practice recommendations. A written personalized care plan for preventive services as well as general preventive health recommendations were provided to patient.     Jasper Riling, CMA   04/22/2023

## 2023-04-22 NOTE — Patient Instructions (Signed)
  Ms. Kuehl , Thank you for taking time to come for your Medicare Wellness Visit. I appreciate your ongoing commitment to your health goals. Please review the following plan we discussed and let me know if I can assist you in the future.   These are the goals we discussed:  Goals      Exercise 3x per week (30 min per time)     Recommend starting a routine exercise program at least 3 days a week for 30-45 minutes at a time as tolerated.        Patient Stated     Be able to walk      Prevent falls     Patient has a history of previous falls. Encouraged to take her time, keep walkways free of clutter      Quit smoking / using tobacco        This is a list of the screening recommended for you and due dates:  Health Maintenance  Topic Date Due   Zoster (Shingles) Vaccine (1 of 2) Never done   DTaP/Tdap/Td vaccine (2 - Tdap) 06/13/2020   COVID-19 Vaccine (3 - Pfizer risk series) 08/19/2020   Eye exam for diabetics  05/13/2022   Hemoglobin A1C  05/27/2023   Yearly kidney health urinalysis for diabetes  05/28/2023   Complete foot exam   05/28/2023   Flu Shot  06/17/2023   Yearly kidney function blood test for diabetes  03/03/2024   Medicare Annual Wellness Visit  04/21/2024   Pneumonia Vaccine  Completed   DEXA scan (bone density measurement)  Completed   Hepatitis C Screening  Completed   HPV Vaccine  Aged Out   Colon Cancer Screening  Discontinued

## 2023-05-05 ENCOUNTER — Ambulatory Visit (INDEPENDENT_AMBULATORY_CARE_PROVIDER_SITE_OTHER): Payer: Medicare HMO | Admitting: Family Medicine

## 2023-05-05 ENCOUNTER — Other Ambulatory Visit (HOSPITAL_COMMUNITY): Payer: Self-pay | Admitting: Family Medicine

## 2023-05-05 ENCOUNTER — Encounter: Payer: Self-pay | Admitting: Family Medicine

## 2023-05-05 VITALS — BP 118/74 | HR 69 | Resp 16 | Ht 64.0 in | Wt 185.0 lb

## 2023-05-05 DIAGNOSIS — F17218 Nicotine dependence, cigarettes, with other nicotine-induced disorders: Secondary | ICD-10-CM

## 2023-05-05 DIAGNOSIS — Z741 Need for assistance with personal care: Secondary | ICD-10-CM

## 2023-05-05 DIAGNOSIS — E1121 Type 2 diabetes mellitus with diabetic nephropathy: Secondary | ICD-10-CM | POA: Diagnosis not present

## 2023-05-05 DIAGNOSIS — E038 Other specified hypothyroidism: Secondary | ICD-10-CM

## 2023-05-05 DIAGNOSIS — F322 Major depressive disorder, single episode, severe without psychotic features: Secondary | ICD-10-CM

## 2023-05-05 DIAGNOSIS — E559 Vitamin D deficiency, unspecified: Secondary | ICD-10-CM | POA: Diagnosis not present

## 2023-05-05 DIAGNOSIS — E785 Hyperlipidemia, unspecified: Secondary | ICD-10-CM

## 2023-05-05 DIAGNOSIS — I10 Essential (primary) hypertension: Secondary | ICD-10-CM

## 2023-05-05 DIAGNOSIS — R296 Repeated falls: Secondary | ICD-10-CM

## 2023-05-05 DIAGNOSIS — R300 Dysuria: Secondary | ICD-10-CM | POA: Diagnosis not present

## 2023-05-05 DIAGNOSIS — L309 Dermatitis, unspecified: Secondary | ICD-10-CM

## 2023-05-05 DIAGNOSIS — Z1231 Encounter for screening mammogram for malignant neoplasm of breast: Secondary | ICD-10-CM

## 2023-05-05 LAB — POCT URINALYSIS DIP (CLINITEK)
Bilirubin, UA: NEGATIVE
Blood, UA: NEGATIVE
Glucose, UA: NEGATIVE mg/dL
Ketones, POC UA: NEGATIVE mg/dL
Leukocytes, UA: NEGATIVE
Nitrite, UA: NEGATIVE
POC PROTEIN,UA: 30 — AB
Spec Grav, UA: 1.02 (ref 1.010–1.025)
Urobilinogen, UA: 0.2 E.U./dL
pH, UA: 5.5 (ref 5.0–8.0)

## 2023-05-05 MED ORDER — BETAMETHASONE DIPROPIONATE 0.05 % EX OINT: TOPICAL_OINTMENT | CUTANEOUS | 0 refills | Status: DC

## 2023-05-05 NOTE — Patient Instructions (Addendum)
Annual exam in 6 to 8 weeks  Pls stop smoking!  Lipid, cmp and eGFr and HBA1C, TSH today  Please schedule annual screening mammogram past due   Nurse please send for bedside commode and shower chair  Nurse please refer to H/H  needs PT/ OT twice weekly x 6 weeks, unsteady gait, difficulty walking reduced ROM and weaknees of upper extremities  Nurse please refer for CAP 5 days / week 6 hours per day, needs assistance with bath, dressing , food prep ( daughter reports medicaid case worker Ms Laural Benes has been in touch in the past 2 weeks and she should be able to get the assistance she needs)   Cream is prescribed for rashes, use as directed , if not improved call for referral to Dermatology  Thanks for choosing [facility], we consider it a privelige to serve you.

## 2023-05-06 LAB — HEMOGLOBIN A1C
Est. average glucose Bld gHb Est-mCnc: 123 mg/dL
Hgb A1c MFr Bld: 5.9 % — ABNORMAL HIGH (ref 4.8–5.6)

## 2023-05-06 LAB — CMP14+EGFR
ALT: 20 IU/L (ref 0–32)
AST: 53 IU/L — ABNORMAL HIGH (ref 0–40)
Albumin: 3.1 g/dL — ABNORMAL LOW (ref 3.8–4.8)
Alkaline Phosphatase: 112 IU/L (ref 44–121)
BUN/Creatinine Ratio: 18 (ref 12–28)
BUN: 19 mg/dL (ref 8–27)
Bilirubin Total: 0.5 mg/dL (ref 0.0–1.2)
CO2: 23 mmol/L (ref 20–29)
Calcium: 11 mg/dL — ABNORMAL HIGH (ref 8.7–10.3)
Chloride: 102 mmol/L (ref 96–106)
Creatinine, Ser: 1.08 mg/dL — ABNORMAL HIGH (ref 0.57–1.00)
Globulin, Total: 2.8 g/dL (ref 1.5–4.5)
Glucose: 143 mg/dL — ABNORMAL HIGH (ref 70–99)
Potassium: 4 mmol/L (ref 3.5–5.2)
Sodium: 137 mmol/L (ref 134–144)
Total Protein: 5.9 g/dL — ABNORMAL LOW (ref 6.0–8.5)
eGFR: 53 mL/min/{1.73_m2} — ABNORMAL LOW (ref >59)

## 2023-05-06 LAB — LIPID PANEL
Chol/HDL Ratio: 2 ratio (ref 0.0–4.4)
Cholesterol, Total: 70 mg/dL — ABNORMAL LOW (ref 100–199)
HDL: 35 mg/dL — ABNORMAL LOW (ref >39)
LDL Chol Calc (NIH): 18 mg/dL (ref 0–99)
Triglycerides: 79 mg/dL (ref 0–149)
VLDL Cholesterol Cal: 17 mg/dL (ref 5–40)

## 2023-05-06 LAB — TSH: TSH: 0.329 u[IU]/mL — ABNORMAL LOW (ref 0.450–4.500)

## 2023-05-06 MED ORDER — LEVOTHYROXINE SODIUM 125 MCG PO TABS
125.0000 ug | ORAL_TABLET | Freq: Every day | ORAL | 1 refills | Status: DC
Start: 2023-05-06 — End: 2023-10-01

## 2023-05-07 ENCOUNTER — Other Ambulatory Visit: Payer: Self-pay | Admitting: Family Medicine

## 2023-05-10 NOTE — Progress Notes (Signed)
Cardiology Office Note:  .   Date:  05/24/2023  ID:  Tammy Suarez, DOB [birth year], MRN [id number] PCP: Kerri Perches, MD  [facility] Providers Cardiologist:  Nona Dell, MD    History of Present Illness: .   Tammy Suarez is a 77 y.o. female  with a past medical history of coronary artery disease, essential hypertension, GERD, type II diabetes, hypothyroidism, CKD stage III, hyperlipidemia, nicotine dependence,  Echocardiogram 2014 with LVEF 55-60%, no WMA, no valvular abnormalities noted. Lexiscan MPI 08/2018 low risk study without ischemia or scar.   Last seen in our office 11/2022 and having increased chest pain, shortness of breath, fatigue using NTG several times daily. Lexiscan and echo ordered but never done.   Hospitalized in 02/2023 with COPD exacerbation and CHF and NSTEMI peak trop 1003. Chart reviewed and shows MAT with  Elevated troponins felt to be demand ischemia and told to f/u with [place] for ischemic w/u. Sent home with monitor. Also found mass like consolidation in lung concerning for malignancy. Was to have repeat CT in 4 weeks. Echo reviewed EF >55% mild to mod MR, LA mod dilated, mild to mod TR,   Patient has constant breast pain since a biopsy last year. Also has constant left chest/shoulder pain relieved with pain meds. Also has right shoulder pain. Here in a wheelchair and walks with a walker in house.  Stopped smoking. Denies chest pressure or dyspnea. Has tried NTG for pain and it doesn't help.   ROS:    Studies Reviewed: .        Echocardiogram W Colorflow Spectral Doppler  Result Date: 02/24/2023 Patient Info Name: Tammy Suarez Age: 27 years DOB: 1946-06-11 Gender: Female MRN: [id number] Accession #: [id number] [facility] Account #: [id number] Ht: 165 cm Wt: 86 kg BSA: 2.01 m2 BP: 100 / 34 mmHg HR: 79 bpm Heart Rhythm: Sinus Rhythm Exam Date: 02/24/2023 11:41 AM Admit Date: 02/23/2023 Exam Type: ECHOCARDIOGRAM W COLORFLOW SPECTRAL DOPPLER  Technical Quality: Fair Staff Sonographer: Rocky Link Supervising Physician: Hubert Azure MD Referring Physician: Viann Shove Study Info Indications - CHF, a-fib. h/o CABG Procedure(s) Complete two-dimensional, color flow and Doppler transthoracic echocardiogram is performed. Summary 1. The left ventricle is normal in size with upper normal wall thickness. 2. The left ventricular systolic function is normal, LVEF is visually estimated at > 55%. 3. The mitral valve leaflets are mildly thickened with normal leaflet mobility. 4. There is mild to moderate mitral valve regurgitation. 5. The left atrium is moderately dilated in size. 6. The right ventricle is normal in size, with normal systolic function. 7. There is mild to moderate tricuspid regurgitation. Left Ventricle The left ventricle is normal in size with upper normal wall thickness. The left ventricular systolic function is normal, LVEF is visually estimated at > 55%. The left ventricular diastolic function is indeterminate. Right Ventricle The right ventricle is normal in size, with normal systolic function. Left Atrium The left atrium is moderately dilated in size. Right Atrium The right atrium is normal in size. Aortic Valve The aortic valve is trileaflet with normal appearing leaflets with normal excursion. There is no significant aortic regurgitation. There is no evidence of a significant transvalvular gradient. Mitral Valve The mitral valve leaflets are mildly thickened with normal leaflet mobility. There is mild to moderate mitral valve regurgitation. Tricuspid Valve The tricuspid valve leaflets are normal, with normal leaflet mobility. There is mild to moderate tricuspid regurgitation. There is no pulmonary hypertension. TR maximum velocity: 2.4 m/s  Estimated PASP: 26 mmHg. Pulmonic Valve The pulmonic valve is normal. There is trivial pulmonic regurgitation. There is no evidence of a significant transvalvular gradient. Aorta The aorta is normal  in size in the visualized segments. Inferior Vena Cava IVC size and inspiratory change suggest normal right atrial pressure. (0-5 mmHg). Pericardium/Pleural There is no pericardial effusion. Other Findings Rhythm: Sinus Rhythm. Ventricles ---------------------------------------------------------------------- Name Value Normal ---------------------------------------------------------------------- LV Dimensions 2D/MM ---------------------------------------------------------------------- IVS Diastolic Thickness (2D) 1.1 cm 0.6-0.9 LVID Diastole (2D) 5.1 cm 3.8-5.2 LVPW Diastolic Thickness (2D) 1.1 cm 0.6-0.9 LVID Systole (2D) 4.0 cm 2.2-3.5 LVOT Diameter 1.8 cm LV Function ---------------------------------------------------------------------- LV EF (4C MOD) 66 % LV EF (BP MOD) 65 % 54-74 RV Dimensions 2D/MM ---------------------------------------------------------------------- RV Basal Diastolic Dimension 3.6 cm 2.5-4.1 TAPSE 2.0 cm >=1.7 Atria ---------------------------------------------------------------------- Name Value Normal ---------------------------------------------------------------------- LA Dimensions ---------------------------------------------------------------------- LA Dimension (2D) 3.5 cm 2.7-3.8 LA Volume Index (4C A-L) 31.36 ml/m2 LA Volume Index (2C A-L) 30.64 ml/m2 LA Volume (BP MOD) 62 ml LA Volume Index (BP MOD) 30.64 ml/m2 16.00-34.00 RA Dimensions ---------------------------------------------------------------------- RA Area (4C) 14.8 cm2 <=18.0 RA Area (4C) Index 7.4 cm2/m2 RA ESV Index (4C MOD) 20 ml/m2 15-27 Left Ventricular Outflow Tract ---------------------------------------------------------------------- Name Value Normal ---------------------------------------------------------------------- LVOT 2D ---------------------------------------------------------------------- LVOT Diameter 1.8 cm LVOT Area 2.5 cm2 Mitral Valve  ---------------------------------------------------------------------- Name Value Normal ---------------------------------------------------------------------- MV Diastolic Function ---------------------------------------------------------------------- MV E Peak Velocity 121 cm/s MV A Peak Velocity 111 cm/s MV E/A 1.1 MV Annular TDI ---------------------------------------------------------------------- MV Septal e' Velocity 4.8 cm/s >=8.0 MV E/e' (Septal) 25.3 MV Lateral e' Velocity 8.8 cm/s >=10.0 MV E/e' (Lateral) 13.7 MV e' Average 6.8 cm/s MV E/e' (Average) 19.5 Tricuspid Valve ---------------------------------------------------------------------- Name Value Normal ---------------------------------------------------------------------- TV Regurgitation Doppler ---------------------------------------------------------------------- TR Peak Velocity 2.4 m/s Estimated PAP/RSVP ---------------------------------------------------------------------- RA Pressure 3 mmHg <=5 RV Systolic Pressure 26 mmHg <36 Aorta ---------------------------------------------------------------------- Name Value Normal ---------------------------------------------------------------------- Ascending Aorta ---------------------------------------------------------------------- Ao Root Diameter (2D) 2.8 cm Ao Root Diam Index (2D) 1.4 cm/m2 Asc Ao Diameter 3.5 cm Venous ---------------------------------------------------------------------- Name Value Normal ---------------------------------------------------------------------- IVC/SVC ---------------------------------------------------------------------- IVC Diameter (Exp 2D) 1.4 cm <=2.1 Report Signatures Finalized by Hubert Azure MD on 02/24/2023 12:34 PM  CTA Chest W Contrast  Result Date: 02/23/2023 CLINICAL DATA: Rule out PE shortness of breath * Tracking Code: BO * EXAM: CT ANGIOGRAPHY CHEST WITH CONTRAST TECHNIQUE: Multidetector CT imaging of the chest was performed using the standard  protocol during bolus administration of intravenous contrast. Multiplanar CT image reconstructions and MIPs were obtained to evaluate the vascular anatomy. RADIATION DOSE REDUCTION: This exam was performed according to the departmental dose-optimization program which includes automated exposure control, adjustment of the mA and/or kV according to patient size and/or use of iterative reconstruction technique. CONTRAST: 60 mL Omnipaque 350 iodinated contrast IV COMPARISON: 02/10/2022 FINDINGS: Cardiovascular: Satisfactory opacification of the pulmonary arteries to the segmental level. No evidence of pulmonary embolism. Normal heart size. Extensive three-vessel coronary artery calcifications and stents. Status post median sternotomy and CABG. No pericardial effusion. Aortic atherosclerosis. Mediastinum/Nodes: Multiple newly enlarged pretracheal, right hilar, and subcarinal lymph nodes, pretracheal nodes measuring up to 1.4 x 1.2 cm (series 4, image 48). Thyroid gland, trachea, and esophagus demonstrate no significant findings. Lungs/Pleura: Paramedian suprahilar masslike consolidation of the right upper lobe measuring 4.0 x 2.5 cm, new compared to prior examination (series 4, image 53). Extensive heterogeneous and consolidative airspace opacity in the right upper lobe peripheral to this lesion. No pleural effusion or pneumothorax. Upper Abdomen: No acute abnormality. Adjustable lap band. Musculoskeletal: No chest wall abnormality. No acute  osseous findings. Disc degenerative disease and bridging osteophytosis throughout the thoracic spine in keeping with DISH. Review of the MIP images confirms the above findings.  1. Negative examination for pulmonary embolism. 2. Paramedian suprahilar masslike consolidation of the right upper lobe measuring 4.0 x 2.5 cm, new compared to prior examination. Extensive heterogeneous and consolidative airspace opacity in the right upper lobe peripheral to this lesion, likely  postobstructive. 3. Multiple newly enlarged pretracheal, right hilar, and subcarinal lymph nodes. 4. Constellation of findings is highly concerning for primary lung malignancy. 5. Coronary artery disease. Aortic Atherosclerosis (ICD10-I70.0). Electronically Signed By: Jearld Lesch M.D. On: 02/23/2023 11:09  Risk Assessment/Calculations:           Physical Exam:   VS:  BP (!) 144/70   Pulse 72   Ht 5\' 4"  (1.626 m)   Wt 187 lb (84.8 kg)   SpO2 98%   BMI 32.10 kg/m    Wt Readings from Last 3 Encounters:  05/24/23 187 lb (84.8 kg)  05/05/23 185 lb (83.9 kg)  03/04/23 176 lb 5.9 oz (80 kg)    GEN: Obese, in no acute distress NECK: No JVD; No carotid bruits CARDIAC:  RRR some skipping, 1/6 systolic murmur LSB, rubs, gallops RESPIRATORY: decreased breath sounds  without rales, wheezing or rhonchi  ABDOMEN: Soft, non-tender, non-distended EXTREMITIES:  No edema; No deformity   ASSESSMENT AND Suarez: .    CAD s/p CABG in 2014 (LIMA-LAD, SVG- OM, and SVG to PDA), hospitalized 02/2023 with NSTEMI trop 1000 felt to be demand ischemia but ischemia w/u recommended as OP. Echo normal LVEF-see above. -patient with constant chest, shoulder and arm pain due to arthritis relieved with pain meds and followed in pain clinic. No relief from NTG -constant breast pain since biopsy last year. -hold off on ischemic w/u at this time  Atrial tachycardia noted on admission to [facility] but not captured on EKG or monitor. Reviewed notes. Zio placed but patient took it off before discharge and left it there. Will place 2 week Zio  Lung mass on CT-was supposed to have repeat CT 4 weeks after d/c. Will let PCP order   HTN -BP mildly up but crying with pain. No changes today  type II diabetes,   CKD stage IIIb,   Tobacco abuse-quit smoking   hyperlipidemia.       Dispo: f/u after Zio  Signed, Jacolyn Reedy, PA-C

## 2023-05-11 ENCOUNTER — Other Ambulatory Visit: Payer: Self-pay

## 2023-05-11 ENCOUNTER — Encounter: Payer: Self-pay | Admitting: Family Medicine

## 2023-05-11 DIAGNOSIS — L309 Dermatitis, unspecified: Secondary | ICD-10-CM | POA: Insufficient documentation

## 2023-05-11 DIAGNOSIS — Z741 Need for assistance with personal care: Secondary | ICD-10-CM

## 2023-05-11 DIAGNOSIS — R300 Dysuria: Secondary | ICD-10-CM | POA: Insufficient documentation

## 2023-05-11 DIAGNOSIS — R296 Repeated falls: Secondary | ICD-10-CM

## 2023-05-11 DIAGNOSIS — F322 Major depressive disorder, single episode, severe without psychotic features: Secondary | ICD-10-CM | POA: Insufficient documentation

## 2023-05-11 MED ORDER — LOSARTAN POTASSIUM 100 MG PO TABS: 100.0000 mg | ORAL_TABLET | Freq: Every day | ORAL | 1 refills | Status: DC

## 2023-05-11 MED ORDER — DULOXETINE HCL 60 MG PO CPEP: 60.0000 mg | ORAL_CAPSULE | Freq: Two times a day (BID) | ORAL | 0 refills | Status: DC

## 2023-05-11 NOTE — Assessment & Plan Note (Signed)
CCUA negative for infection 

## 2023-05-11 NOTE — Assessment & Plan Note (Signed)
Under corrected , inc dose synthroid

## 2023-05-11 NOTE — Assessment & Plan Note (Signed)
Hyperlipidemia:Low fat diet discussed and encouraged.   Lipid Panel  Lab Results  Component Value Date   CHOL 70 (L) 05/05/2023   HDL 35 (L) 05/05/2023   LDLCALC 18 05/05/2023   TRIG 79 05/05/2023   CHOLHDL 2.0 05/05/2023     Controlled, no change in medication

## 2023-05-11 NOTE — Assessment & Plan Note (Signed)
Generalized weakness with severe arhtritis of spine, needs in home PT/OT twice weekly x 6 weeks

## 2023-05-11 NOTE — Assessment & Plan Note (Signed)
PHQ 9 score of 18 refer psychiatry

## 2023-05-11 NOTE — Progress Notes (Signed)
Tammy Suarez     MRN: [id number]      DOB: 04-17-1946  Chief Complaint  Patient presents with   Referral    [facility] referral and shower chair    Urinary Tract Infection    Feels like she has UTI x 2 days. Not bad yet but she can feel something is not right     HPI Tammy Suarez is here for follow up and re-evaluation of chronic medical conditions, medication management and review of any available recent lab and radiology data.  Preventive health is updated, specifically  Cancer screening and Immunization.   Questions or concerns regarding consultations or procedures which the PT has had in the interim are  addressed. The PT denies any adverse reactions to current medications since the last visit.  Needs h/h for PT/OT due to weakness , a of upper and lower ext , needs assistance with ADLs  ROS Denies recent fever or chills. Denies sinus pressure, nasal congestion, ear pain or sore throat. Denies chest congestion, productive cough or wheezing. Denies chest pains, palpitations and leg swelling Denies abdominal pain, nausea, vomiting,diarrhea or constipation.   . C/o chronic generalized  joint pain and limitation in mobility. Denies headaches, seizures, . C/o  depression an  anxiety not suicidal or homicidal. C/o rash on right forearm and left cheek, itches sllightly PE  BP 118/74   Pulse 69   Resp 16   Ht 5\' 4"  (1.626 m)   Wt 185 lb (83.9 kg)   SpO2 96%   BMI 31.76 kg/m   Patient alert and oriented and in no cardiopulmonary distress.  HEENT: No facial asymmetry, EOMI,     Neck decreased ROM.  Chest: Clear to auscultation bilaterally.  CVS: S1, S2 no murmurs, no S3.Regular rate.  ABD: Soft non tender.   Ext: No edema  MS: decreased ROM spine, shoulders, hips and knees.  Skin: erythematous macular rash on right forearm and on left cheek  Psych: Good eye contact, flat  affect. Mildly  anxious and  depressed appearing.  CNS: CN 2-12 intact, decreased power and tone  throughout ,no focal deficits Assessment & Plan  Recurrent falls Generalized weakness with severe arhtritis of spine, needs in home PT/OT twice weekly x 6 weeks  Nicotine dependence Asked:confirms currently smokes cigarettes Assess: Unwilling to set a quit date, but is cutting back Advise: needs to QUIT to reduce risk of cancer, cardio and cerebrovascular disease Assist: counseled for 5 minutes and literature provided Arrange: follow up in 2 to 4 months   Depression, major, single episode, severe (HCC) PHQ 9 score of 18 refer psychiatry  Hyperlipidemia with target LDL less than 70 Hyperlipidemia:Low fat diet discussed and encouraged.   Lipid Panel  Lab Results  Component Value Date   CHOL 70 (L) 05/05/2023   HDL 35 (L) 05/05/2023   LDLCALC 18 05/05/2023   TRIG 79 05/05/2023   CHOLHDL 2.0 05/05/2023     Controlled, no change in medication   Hypothyroidism Under corrected , inc dose synthroid  DM (diabetes mellitus), type 2 with renal complications (HCC) Tammy Suarez is reminded of the importance of commitment to daily physical activity for 30 minutes or more, as able and the need to limit carbohydrate intake to 30 to 60 grams per meal to help with blood sugar control.     Tammy Suarez is reminded of the importance of daily foot exam, annual eye examination, and good blood sugar, blood pressure and cholesterol control.  Latest Ref Rng & Units 05/05/2023    3:54 PM 03/04/2023    5:30 PM 11/26/2022    3:27 PM 05/27/2022   11:38 AM 12/30/2021    2:40 PM  Diabetic Labs  HbA1c 4.8 - 5.6 % 5.9   6.1  6.0  6.5   Micro/Creat Ratio 0 - 29 mg/g creat    102    Chol 100 - 199 mg/dL 70   89   72   HDL >33 mg/dL 35   <29   14   Calc LDL 0 - 99 mg/dL 18   NOT CALCULATED   38   Triglycerides 0 - 149 mg/dL 79   97   518   Creatinine 0.57 - 1.00 mg/dL 8.41  6.60  6.30  1.60  1.84       05/05/2023    2:34 PM 03/05/2023    8:44 PM 03/05/2023    7:30 PM 03/05/2023   10:00 AM  03/05/2023    9:30 AM 03/05/2023    9:00 AM 03/05/2023    8:30 AM  BP/Weight  Systolic BP 118 150 113 135 189 166 163  Diastolic BP 74 62 92 63 66 66 109  Wt. (Lbs) 185        BMI 31.76 kg/m2            Latest Ref Rng & Units 05/13/2021   12:00 AM 01/23/2020   12:00 AM  Foot/eye exam completion dates  Eye Exam No Retinopathy No Retinopathy     No Retinopathy         This result is from an external source.      Diet controlled  Dermatitis Topical betamethasone sparingly twice daily to forearm and face  Requires assistance with activities of daily living (ADL) Marked limitation in mobility as well as use of upper extremities, needs assistance with bathing, dressing , food prep as well as keeping living environment clean needs CAP

## 2023-05-11 NOTE — Assessment & Plan Note (Signed)
Ms. Mcginnity is reminded of the importance of commitment to daily physical activity for 30 minutes or more, as able and the need to limit carbohydrate intake to 30 to 60 grams per meal to help with blood sugar control.     Ms. Liao is reminded of the importance of daily foot exam, annual eye examination, and good blood sugar, blood pressure and cholesterol control.     Latest Ref Rng & Units 05/05/2023    3:54 PM 03/04/2023    5:30 PM 11/26/2022    3:27 PM 05/27/2022   11:38 AM 12/30/2021    2:40 PM  Diabetic Labs  HbA1c 4.8 - 5.6 % 5.9   6.1  6.0  6.5   Micro/Creat Ratio 0 - 29 mg/g creat    102    Chol 100 - 199 mg/dL 70   89   72   HDL >84 mg/dL 35   <16   14   Calc LDL 0 - 99 mg/dL 18   NOT CALCULATED   38   Triglycerides 0 - 149 mg/dL 79   97   606   Creatinine 0.57 - 1.00 mg/dL 3.01  6.01  0.93  2.35  1.84       05/05/2023    2:34 PM 03/05/2023    8:44 PM 03/05/2023    7:30 PM 03/05/2023   10:00 AM 03/05/2023    9:30 AM 03/05/2023    9:00 AM 03/05/2023    8:30 AM  BP/Weight  Systolic BP 118 150 113 135 189 166 163  Diastolic BP 74 62 92 63 66 66 573  Wt. (Lbs) 185        BMI 31.76 kg/m2            Latest Ref Rng & Units 05/13/2021   12:00 AM 01/23/2020   12:00 AM  Foot/eye exam completion dates  Eye Exam No Retinopathy No Retinopathy     No Retinopathy         This result is from an external source.      Diet controlled

## 2023-05-11 NOTE — Assessment & Plan Note (Signed)
Marked limitation in mobility as well as use of upper extremities, needs assistance with bathing, dressing , food prep as well as keeping living environment clean needs CAP

## 2023-05-11 NOTE — Assessment & Plan Note (Signed)
Topical betamethasone sparingly twice daily to forearm and face

## 2023-05-11 NOTE — Assessment & Plan Note (Signed)
Asked:confirms currently smokes cigarettes °Assess: Unwilling to set a quit date, but is cutting back °Advise: needs to QUIT to reduce risk of cancer, cardio and cerebrovascular disease °Assist: counseled for 5 minutes and literature provided °Arrange: follow up in 2 to 4 months ° °

## 2023-05-12 DIAGNOSIS — R531 Weakness: Secondary | ICD-10-CM | POA: Diagnosis not present

## 2023-05-12 DIAGNOSIS — Z9181 History of falling: Secondary | ICD-10-CM | POA: Diagnosis not present

## 2023-05-12 DIAGNOSIS — R262 Difficulty in walking, not elsewhere classified: Secondary | ICD-10-CM | POA: Diagnosis not present

## 2023-05-18 DIAGNOSIS — M545 Low back pain, unspecified: Secondary | ICD-10-CM | POA: Diagnosis not present

## 2023-05-18 DIAGNOSIS — G894 Chronic pain syndrome: Secondary | ICD-10-CM | POA: Diagnosis not present

## 2023-05-18 DIAGNOSIS — Z79891 Long term (current) use of opiate analgesic: Secondary | ICD-10-CM | POA: Diagnosis not present

## 2023-05-18 DIAGNOSIS — M25561 Pain in right knee: Secondary | ICD-10-CM | POA: Diagnosis not present

## 2023-05-18 DIAGNOSIS — M5412 Radiculopathy, cervical region: Secondary | ICD-10-CM | POA: Diagnosis not present

## 2023-05-18 DIAGNOSIS — M25562 Pain in left knee: Secondary | ICD-10-CM | POA: Diagnosis not present

## 2023-05-18 DIAGNOSIS — Z79899 Other long term (current) drug therapy: Secondary | ICD-10-CM | POA: Diagnosis not present

## 2023-05-18 DIAGNOSIS — M542 Cervicalgia: Secondary | ICD-10-CM | POA: Diagnosis not present

## 2023-05-19 ENCOUNTER — Other Ambulatory Visit: Payer: Self-pay

## 2023-05-19 ENCOUNTER — Other Ambulatory Visit: Payer: Self-pay | Admitting: Family Medicine

## 2023-05-19 DIAGNOSIS — Z720 Tobacco use: Secondary | ICD-10-CM | POA: Diagnosis not present

## 2023-05-19 DIAGNOSIS — M5416 Radiculopathy, lumbar region: Secondary | ICD-10-CM | POA: Diagnosis not present

## 2023-05-19 DIAGNOSIS — Z9181 History of falling: Secondary | ICD-10-CM | POA: Diagnosis not present

## 2023-05-19 DIAGNOSIS — M48061 Spinal stenosis, lumbar region without neurogenic claudication: Secondary | ICD-10-CM | POA: Diagnosis not present

## 2023-05-19 DIAGNOSIS — F322 Major depressive disorder, single episode, severe without psychotic features: Secondary | ICD-10-CM | POA: Diagnosis not present

## 2023-05-19 DIAGNOSIS — E119 Type 2 diabetes mellitus without complications: Secondary | ICD-10-CM | POA: Diagnosis not present

## 2023-05-19 DIAGNOSIS — M6281 Muscle weakness (generalized): Secondary | ICD-10-CM | POA: Diagnosis not present

## 2023-05-19 DIAGNOSIS — E785 Hyperlipidemia, unspecified: Secondary | ICD-10-CM | POA: Diagnosis not present

## 2023-05-19 DIAGNOSIS — E039 Hypothyroidism, unspecified: Secondary | ICD-10-CM | POA: Diagnosis not present

## 2023-05-19 DIAGNOSIS — M1611 Unilateral primary osteoarthritis, right hip: Secondary | ICD-10-CM | POA: Diagnosis not present

## 2023-05-19 MED ORDER — ALENDRONATE SODIUM 70 MG PO TABS
ORAL_TABLET | ORAL | 11 refills | Status: DC
Start: 2023-05-19 — End: 2023-09-16

## 2023-05-24 ENCOUNTER — Encounter: Payer: Self-pay | Admitting: Physician Assistant

## 2023-05-24 ENCOUNTER — Ambulatory Visit: Payer: Medicare HMO | Attending: Physician Assistant

## 2023-05-24 ENCOUNTER — Ambulatory Visit: Payer: Medicare HMO | Attending: Nurse Practitioner | Admitting: Physician Assistant

## 2023-05-24 VITALS — BP 144/70 | HR 72 | Ht 64.0 in | Wt 187.0 lb

## 2023-05-24 DIAGNOSIS — I4719 Other supraventricular tachycardia: Secondary | ICD-10-CM

## 2023-05-24 DIAGNOSIS — E785 Hyperlipidemia, unspecified: Secondary | ICD-10-CM | POA: Diagnosis not present

## 2023-05-24 DIAGNOSIS — N1832 Chronic kidney disease, stage 3b: Secondary | ICD-10-CM

## 2023-05-24 DIAGNOSIS — R918 Other nonspecific abnormal finding of lung field: Secondary | ICD-10-CM | POA: Diagnosis not present

## 2023-05-24 DIAGNOSIS — I25810 Atherosclerosis of coronary artery bypass graft(s) without angina pectoris: Secondary | ICD-10-CM | POA: Diagnosis not present

## 2023-05-24 DIAGNOSIS — I10 Essential (primary) hypertension: Secondary | ICD-10-CM | POA: Diagnosis not present

## 2023-05-24 DIAGNOSIS — Z720 Tobacco use: Secondary | ICD-10-CM | POA: Diagnosis not present

## 2023-05-24 NOTE — Patient Instructions (Signed)
Medication Instructions:  Your physician recommends that you continue on your current medications as directed. Please refer to the Current Medication list given to you today.   *If you need a refill on your cardiac medications before your next appointment, please call your pharmacy*   Lab Work: NONE   If you have labs (blood work) drawn today and your tests are completely normal, you will receive your results only by: MyChart Message (if you have MyChart) OR A paper copy in the mail If you have any lab test that is abnormal or we need to change your treatment, we will call you to review the results.   Testing/Procedures: Christena Deem- Long Term Monitor Instructions   Your physician has requested you wear your ZIO patch monitor___14____days.   This is a single patch monitor.  [employer] supplies one patch monitor per enrollment.  Additional stickers are not available.   Please do not apply patch if you will be having a Nuclear Stress Test, Echocardiogram, Cardiac CT, MRI, or Chest Xray during the time frame you would be wearing the monitor. The patch cannot be worn during these tests.  You cannot remove and re-apply the ZIO XT patch monitor.   Your ZIO patch monitor will be sent USPS Priority mail from [facility] directly to your home address. The monitor may also be mailed to a PO BOX if home delivery is not available.   It may take 3-5 days to receive your monitor after you have been enrolled.   Once you have received you monitor, please review enclosed instructions.  Your monitor has already been registered assigning a specific monitor serial # to you.   Applying the monitor   Shave hair from upper left chest.   Hold abrader disc by orange tab.  Rub abrader in 40 strokes over left upper chest as indicated in your monitor instructions.   Clean area with 4 enclosed alcohol pads .  Use all pads to assure are is cleaned thoroughly.  Let dry.   Apply patch as indicated in monitor  instructions.  Patch will be place under collarbone on left side of chest with arrow pointing upward.   Rub patch adhesive wings for 2 minutes.Remove white label marked "1".  Remove white label marked "2".  Rub patch adhesive wings for 2 additional minutes.   While looking in a mirror, press and release button in center of patch.  A small green light will flash 3-4 times .  This will be your only indicator the monitor has been turned on.     Do not shower for the first 24 hours.  You may shower after the first 24 hours.   Press button if you feel a symptom. You will hear a small click.  Record Date, Time and Symptom in the Patient Log Book.   When you are ready to remove patch, follow instructions on last 2 pages of Patient Log Book.  Stick patch monitor onto last page of Patient Log Book.   Place Patient Log Book in [place] box.  Use locking tab on box and tape box closed securely.  The [employer] and [employer] has [employer] on it.  Please place in mailbox as soon as possible.  Your physician should have your test results approximately 7 days after the monitor has been mailed back to [facility].   Call [facility] Customer Care at [phone] if you have questions regarding your ZIO XT patch monitor.  Call them immediately if you see an  orange light blinking on your monitor.   If your monitor falls off in less than 4 days contact our Monitor department at [phone].  If your monitor becomes loose or falls off after 4 days call [employer] at [phone] for suggestions on securing your monitor.     Follow-Up: At [facility], you and your health needs are our priority.  As part of our continuing mission to provide you with exceptional heart care, we have created designated Provider Care Teams.  These Care Teams include your primary Cardiologist (physician) and Advanced Practice Providers (APPs -  Physician Assistants and Nurse Practitioners) who all work together to provide  you with the care you need, when you need it.  We recommend signing up for the patient portal called "MyChart".  Sign up information is provided on this After Visit Summary.  MyChart is used to connect with patients for Virtual Visits (Telemedicine).  Patients are able to view lab/test results, encounter notes, upcoming appointments, etc.  Non-urgent messages can be sent to your provider as well.   To learn more about what you can do with MyChart, go to [email or web address].    Your next appointment:   2 month(s)  Provider:   Nona Dell, MD    Other Instructions Thank you for choosing [facility]!

## 2023-05-25 DIAGNOSIS — H524 Presbyopia: Secondary | ICD-10-CM | POA: Diagnosis not present

## 2023-05-25 DIAGNOSIS — Z0100 Encounter for examination of eyes and vision without abnormal findings: Secondary | ICD-10-CM | POA: Diagnosis not present

## 2023-06-14 DIAGNOSIS — I4719 Other supraventricular tachycardia: Secondary | ICD-10-CM | POA: Diagnosis not present

## 2023-06-15 ENCOUNTER — Telehealth: Payer: Self-pay | Admitting: Family Medicine

## 2023-06-15 ENCOUNTER — Telehealth: Payer: Self-pay

## 2023-06-15 DIAGNOSIS — M5412 Radiculopathy, cervical region: Secondary | ICD-10-CM | POA: Diagnosis not present

## 2023-06-15 DIAGNOSIS — M545 Low back pain, unspecified: Secondary | ICD-10-CM | POA: Diagnosis not present

## 2023-06-15 DIAGNOSIS — M25561 Pain in right knee: Secondary | ICD-10-CM | POA: Diagnosis not present

## 2023-06-15 DIAGNOSIS — Z79891 Long term (current) use of opiate analgesic: Secondary | ICD-10-CM | POA: Diagnosis not present

## 2023-06-15 DIAGNOSIS — G894 Chronic pain syndrome: Secondary | ICD-10-CM | POA: Diagnosis not present

## 2023-06-15 DIAGNOSIS — M25562 Pain in left knee: Secondary | ICD-10-CM | POA: Diagnosis not present

## 2023-06-15 DIAGNOSIS — Z79899 Other long term (current) drug therapy: Secondary | ICD-10-CM | POA: Diagnosis not present

## 2023-06-15 MED ORDER — METOPROLOL SUCCINATE ER 50 MG PO TB24: ORAL_TABLET | ORAL | 3 refills | Status: DC

## 2023-06-15 NOTE — Telephone Encounter (Signed)
Information given

## 2023-06-15 NOTE — Telephone Encounter (Signed)
Patient agrees to increase lopressor, e-scribed to [employer]

## 2023-06-15 NOTE — Telephone Encounter (Signed)
[employer] called from [employer] trying to for home health service code patient chart and needs dx clarification [phone].

## 2023-06-15 NOTE — Telephone Encounter (Signed)
I spoke with patient and discussed monitor results.She has no awareness of skipped beats. She describes feeling tired all the time and said she would increase the BB only if you felt like she needed it.  Please advise.

## 2023-06-15 NOTE — Telephone Encounter (Signed)
-----   Message from Jacolyn Reedy sent at 06/15/2023  8:25 AM EDT ----- Monitor shows skipping from top chamber of heart. Can try to increase toprol 50 mg am 25 mg in pm. Ask her how she's feeling and if she notices the skipping? Thanks

## 2023-06-18 ENCOUNTER — Telehealth: Payer: Self-pay | Admitting: Cardiology

## 2023-06-18 NOTE — Telephone Encounter (Signed)
Pt c/o medication issue:  1. Name of Medication:   metoprolol succinate (TOPROL-XL) 50 MG 24 hr tablet  losartan (COZAAR) 100 MG tablet   2. How are you currently taking this medication (dosage and times per day)?   3. Are you having a reaction (difficulty breathing--STAT)?   4. What is your medication issue?   Patient wants a call back to clarify how she should be taking these medications.

## 2023-06-18 NOTE — Telephone Encounter (Signed)
Went over medications with pt- pt advised that Toprol XL was increased to 1 tablet (50 mg) in the morning and 1/2 tablet (25 mg) at bed time. Pt also advised to continue Losartan at 100 mg tablet 1 time daily as this was not d/c'd by provider.   Pt verbalized understanding. Pt had no further questions or concerns at this time.

## 2023-06-25 DIAGNOSIS — M5416 Radiculopathy, lumbar region: Secondary | ICD-10-CM | POA: Diagnosis not present

## 2023-06-25 DIAGNOSIS — E039 Hypothyroidism, unspecified: Secondary | ICD-10-CM | POA: Diagnosis not present

## 2023-06-25 DIAGNOSIS — M6281 Muscle weakness (generalized): Secondary | ICD-10-CM | POA: Diagnosis not present

## 2023-06-25 DIAGNOSIS — M1611 Unilateral primary osteoarthritis, right hip: Secondary | ICD-10-CM | POA: Diagnosis not present

## 2023-06-25 DIAGNOSIS — E119 Type 2 diabetes mellitus without complications: Secondary | ICD-10-CM | POA: Diagnosis not present

## 2023-06-25 DIAGNOSIS — E785 Hyperlipidemia, unspecified: Secondary | ICD-10-CM | POA: Diagnosis not present

## 2023-06-25 DIAGNOSIS — F322 Major depressive disorder, single episode, severe without psychotic features: Secondary | ICD-10-CM | POA: Diagnosis not present

## 2023-06-25 DIAGNOSIS — Z720 Tobacco use: Secondary | ICD-10-CM | POA: Diagnosis not present

## 2023-06-25 DIAGNOSIS — M48061 Spinal stenosis, lumbar region without neurogenic claudication: Secondary | ICD-10-CM | POA: Diagnosis not present

## 2023-06-25 DIAGNOSIS — Z9181 History of falling: Secondary | ICD-10-CM | POA: Diagnosis not present

## 2023-06-30 ENCOUNTER — Ambulatory Visit (HOSPITAL_COMMUNITY): Payer: Medicare HMO

## 2023-06-30 ENCOUNTER — Emergency Department (HOSPITAL_COMMUNITY)
Admission: EM | Admit: 2023-06-30 | Discharge: 2023-06-30 | Disposition: A | Discharge status: Home / Self Care | Payer: Medicare HMO | Attending: Emergency Medicine | Admitting: Emergency Medicine

## 2023-06-30 ENCOUNTER — Emergency Department (HOSPITAL_COMMUNITY): Payer: Medicare HMO

## 2023-06-30 ENCOUNTER — Encounter (HOSPITAL_COMMUNITY): Payer: Self-pay

## 2023-06-30 ENCOUNTER — Ambulatory Visit (HOSPITAL_COMMUNITY)
Admission: RE | Admit: 2023-06-30 | Discharge: 2023-06-30 | Disposition: A | Discharge status: Home / Self Care | Payer: Medicare HMO | Source: Ambulatory Visit | Attending: Family Medicine | Admitting: Family Medicine

## 2023-06-30 ENCOUNTER — Other Ambulatory Visit: Payer: Self-pay

## 2023-06-30 ENCOUNTER — Encounter: Payer: Medicare HMO | Admitting: Family Medicine

## 2023-06-30 ENCOUNTER — Encounter (HOSPITAL_COMMUNITY): Payer: Self-pay | Admitting: Emergency Medicine

## 2023-06-30 DIAGNOSIS — Z79899 Other long term (current) drug therapy: Secondary | ICD-10-CM | POA: Diagnosis not present

## 2023-06-30 DIAGNOSIS — I10 Essential (primary) hypertension: Secondary | ICD-10-CM | POA: Insufficient documentation

## 2023-06-30 DIAGNOSIS — S7002XA Contusion of left hip, initial encounter: Secondary | ICD-10-CM

## 2023-06-30 DIAGNOSIS — I2510 Atherosclerotic heart disease of native coronary artery without angina pectoris: Secondary | ICD-10-CM | POA: Diagnosis not present

## 2023-06-30 DIAGNOSIS — S8002XA Contusion of left knee, initial encounter: Secondary | ICD-10-CM | POA: Diagnosis not present

## 2023-06-30 DIAGNOSIS — W19XXXA Unspecified fall, initial encounter: Secondary | ICD-10-CM | POA: Diagnosis not present

## 2023-06-30 DIAGNOSIS — M25572 Pain in left ankle and joints of left foot: Secondary | ICD-10-CM | POA: Diagnosis not present

## 2023-06-30 DIAGNOSIS — M25562 Pain in left knee: Secondary | ICD-10-CM | POA: Diagnosis not present

## 2023-06-30 DIAGNOSIS — M1712 Unilateral primary osteoarthritis, left knee: Secondary | ICD-10-CM | POA: Diagnosis not present

## 2023-06-30 DIAGNOSIS — M47816 Spondylosis without myelopathy or radiculopathy, lumbar region: Secondary | ICD-10-CM | POA: Diagnosis not present

## 2023-06-30 DIAGNOSIS — M7989 Other specified soft tissue disorders: Secondary | ICD-10-CM | POA: Diagnosis not present

## 2023-06-30 DIAGNOSIS — S79912A Unspecified injury of left hip, initial encounter: Secondary | ICD-10-CM | POA: Diagnosis not present

## 2023-06-30 DIAGNOSIS — Z1231 Encounter for screening mammogram for malignant neoplasm of breast: Secondary | ICD-10-CM | POA: Diagnosis not present

## 2023-06-30 DIAGNOSIS — M79605 Pain in left leg: Secondary | ICD-10-CM | POA: Diagnosis not present

## 2023-06-30 DIAGNOSIS — S8012XA Contusion of left lower leg, initial encounter: Secondary | ICD-10-CM | POA: Diagnosis not present

## 2023-06-30 DIAGNOSIS — S90852A Superficial foreign body, left foot, initial encounter: Secondary | ICD-10-CM | POA: Diagnosis not present

## 2023-06-30 LAB — GLUCOSE, CAPILLARY: Glucose-Capillary: 244 mg/dL — ABNORMAL HIGH (ref 70–99)

## 2023-06-30 MED ORDER — OXYCODONE-ACETAMINOPHEN 5-325 MG PO TABS: 1.0000 | ORAL_TABLET | Freq: Four times a day (QID) | ORAL | 0 refills | Status: DC | PRN

## 2023-06-30 NOTE — ED Provider Notes (Signed)
[place] EMERGENCY DEPARTMENT AT [facility] Provider Note   CSN: [id number] Arrival date & time: 06/30/23  1601     History  Chief Complaint  Patient presents with   Marletta Lor    Tammy Suarez is a 77 y.o. female.  Patient has history of coronary artery disease, hypertension.  Patient fell recently with complaints of left hip pain  The history is provided by the patient and medical records. No language interpreter was used.  Fall This is a new problem. The current episode started 2 days ago. The problem occurs rarely. The problem has been resolved. Pertinent negatives include no chest pain, no abdominal pain and no headaches. Nothing aggravates the symptoms. Nothing relieves the symptoms. She has tried nothing for the symptoms.       Home Medications Prior to Admission medications   Medication Sig Start Date End Date Taking? Authorizing Provider  oxyCODONE-acetaminophen (PERCOCET/ROXICET) 5-325 MG tablet Take 1 tablet by mouth every 6 (six) hours as needed for severe pain. 06/30/23  Yes Bethann Berkshire, MD  alendronate (FOSAMAX) 70 MG tablet TAKE 1 TABLET(70 MG) BY MOUTH EVERY 7 DAYS WITH A FULL GLASS OF WATER AND ON AN EMPTY STOMACH 05/19/23   Del Nigel Berthold, FNP  betamethasone dipropionate (DIPROLENE) 0.05 % ointment Apply sparingly twice daily to rash on right forearm for 2 weeks, then stop, and apply sparingly once daily to rash on left cheek for 1 week, then stop 05/05/23   Kerri Perches, MD  blood glucose meter kit and supplies Dispense based on patient and insurance preference.Once daily testing dx e11.9. 03/25/21   Kerri Perches, MD  Calcium-Magnesium-Vitamin D (CALCIUM 1200+D3 PO) Take by mouth.    [provider]  DULoxetine (CYMBALTA) 60 MG capsule Take 1 capsule (60 mg total) by mouth 2 (two) times daily. 05/11/23   Kerri Perches, MD  ezetimibe (ZETIA) 10 MG tablet TAKE 1 TABLET(10 MG) BY MOUTH DAILY 10/06/22   Kerri Perches,  MD  famotidine (PEPCID) 20 MG tablet TAKE 1 TABLET(20 MG) BY MOUTH TWICE DAILY 12/31/22   Kerri Perches, MD  fenofibrate (TRICOR) 145 MG tablet TAKE 1 TABLET BY MOUTH EVERY DAY 02/01/23   Kerri Perches, MD  isosorbide mononitrate (IMDUR) 30 MG 24 hr tablet TAKE 1 TABLET(30 MG) BY MOUTH AT BEDTIME 11/26/22   Charlsie Quest, NP  levothyroxine (SYNTHROID) 125 MCG tablet Take 1 tablet (125 mcg total) by mouth daily. 05/06/23   Kerri Perches, MD  losartan (COZAAR) 100 MG tablet Take 1 tablet (100 mg total) by mouth daily. 05/11/23   Kerri Perches, MD  metoprolol succinate (TOPROL-XL) 50 MG 24 hr tablet Take 50 mg (1 tablet) every morning and 25 mg (1/2 tablet) at bedtime 06/15/23   Dyann Kief, PA-C  multivitamin-iron-minerals-folic acid (CENTRUM) chewable tablet Chew 1 tablet by mouth daily.    [provider]  nitroGLYCERIN (NITROSTAT) 0.4 MG SL tablet Place 1 tablet (0.4 mg total) under the tongue every 5 (five) minutes as needed for chest pain. 11/26/22   Charlsie Quest, NP  Omega-3 Fatty Acids (FISH OIL) 1200 MG CAPS Take by mouth.    [provider]  ondansetron (ZOFRAN) 4 MG tablet Take one tablet by mouth once daily, as needed, for nausea 12/09/22   Kerri Perches, MD  pregabalin (LYRICA) 25 MG capsule Take 1 capsule (25 mg total) by mouth daily. 12/04/22   Kerri Perches, MD  rosuvastatin (CRESTOR) 40 MG tablet  TAKE 1 TABLET(40 MG) BY MOUTH DAILY 05/07/23   Kerri Perches, MD      Allergies    Gabapentin, Meperidine hcl, Niacin, Propoxyphene n-acetaminophen, and Hydrocodone-acetaminophen    Review of Systems   Review of Systems  Constitutional:  Negative for appetite change and fatigue.  HENT:  Negative for congestion, ear discharge and sinus pressure.   Eyes:  Negative for discharge.  Respiratory:  Negative for cough.   Cardiovascular:  Negative for chest pain.  Gastrointestinal:  Negative for abdominal pain and diarrhea.   Genitourinary:  Negative for frequency and hematuria.  Musculoskeletal:  Negative for back pain.       Left hip and knee pain  Skin:  Negative for rash.  Neurological:  Negative for seizures and headaches.  Psychiatric/Behavioral:  Negative for hallucinations.     Physical Exam Updated Vital Signs BP (!) 150/64   Pulse 61   Temp 99 F (37.2 C) (Oral)   Resp 18   Ht 5\' 4"  (1.626 m)   Wt 84.8 kg   SpO2 96%   BMI 32.09 kg/m  Physical Exam Vitals and nursing note reviewed.  Constitutional:      Appearance: She is well-developed.  HENT:     Head: Normocephalic.     Nose: Nose normal.  Eyes:     General: No scleral icterus.    Conjunctiva/sclera: Conjunctivae normal.  Neck:     Thyroid: No thyromegaly.  Cardiovascular:     Rate and Rhythm: Normal rate and regular rhythm.     Heart sounds: No murmur heard.    No friction rub. No gallop.  Pulmonary:     Breath sounds: No stridor. No wheezing or rales.  Chest:     Chest wall: No tenderness.  Abdominal:     General: There is no distension.     Tenderness: There is no abdominal tenderness. There is no rebound.  Musculoskeletal:        General: Normal range of motion.     Cervical back: Neck supple.     Comments: Bruising to left hip with tenderness and mild tenderness to left knee  Lymphadenopathy:     Cervical: No cervical adenopathy.  Skin:    Findings: No erythema or rash.  Neurological:     Mental Status: She is alert and oriented to person, place, and time.     Motor: No abnormal muscle tone.     Coordination: Coordination normal.  Psychiatric:        Behavior: Behavior normal.     ED Results / Procedures / Treatments   Labs (all labs ordered are listed, but only abnormal results are displayed) Labs Reviewed  GLUCOSE, CAPILLARY - Abnormal; Notable for the following components:      Result Value   Glucose-Capillary 244 (*)    All other components within normal limits  CBG MONITORING, ED     EKG None  Radiology CT Hip Left Wo Contrast  Result Date: 06/30/2023 CLINICAL DATA:  Fall left leg bruising/swelling EXAM: CT OF THE LEFT HIP WITHOUT CONTRAST TECHNIQUE: Multidetector CT imaging of the left hip was performed according to the standard protocol. Multiplanar CT image reconstructions were also generated. RADIATION DOSE REDUCTION: This exam was performed according to the departmental dose-optimization program which includes automated exposure control, adjustment of the mA and/or kV according to patient size and/or use of iterative reconstruction technique. COMPARISON:  Left hip radiographs dated 06/30/2023 FINDINGS: No fracture or dislocation is seen. Left proximal femur and visualized  bony pelvis are intact. Left hip joint space is preserved.  No destructive changes. Vague low-density with stippled calcification in left iliopsoas muscle (series 5/image 44), favoring sequela of remote prior injury. Mild bruising/subcutaneous stranding along the lateral aspect of the left hip/proximal femur (series 5/image 59). Additional mild stranding in the medial left gluteal region (series 5/image 78), incompletely visualized. No drainable fluid collection/abscess. IMPRESSION: No fracture or dislocation is seen. Mild bruising/subcutaneous stranding along the lateral aspect of the left hip/proximal femur. Electronically Signed   By: Charline Bills M.D.   On: 06/30/2023 22:35   DG Hip Unilat W or Wo Pelvis 2-3 Views Left  Result Date: 06/30/2023 CLINICAL DATA:  Fall, left leg pain EXAM: DG HIP (WITH OR WITHOUT PELVIS) 2-3V LEFT COMPARISON:  None Available. FINDINGS: No fracture or dislocation is seen. Bilateral hip joint spaces are preserved. Visualized bony pelvis appears intact. Degenerative changes of the lower lumbar spine. Vascular calcifications. IMPRESSION: Negative. Electronically Signed   By: Charline Bills M.D.   On: 06/30/2023 17:41   DG Tibia/Fibula Left  Result Date:  06/30/2023 CLINICAL DATA:  Fall, left leg pain EXAM: LEFT TIBIA AND FIBULA - 2 VIEW COMPARISON:  None Available. FINDINGS: No fracture or dislocation is seen. Visualized soft tissues are within normal limits. IMPRESSION: Negative. Electronically Signed   By: Charline Bills M.D.   On: 06/30/2023 17:41   DG Knee Complete 4 Views Left  Result Date: 06/30/2023 CLINICAL DATA:  Fall, left leg pain EXAM: LEFT KNEE - COMPLETE 4+ VIEW COMPARISON:  None Available. FINDINGS: No fracture or dislocation is seen. Mild degenerative changes in the medial and lateral compartment with chondrocalcinosis. Visualized soft tissues are within normal limits. No suprapatellar knee joint effusion. IMPRESSION: Negative. Electronically Signed   By: Charline Bills M.D.   On: 06/30/2023 17:41   DG Foot Complete Left  Result Date: 06/30/2023 CLINICAL DATA:  Fall, left leg pain EXAM: LEFT FOOT - COMPLETE 3+ VIEW COMPARISON:  None Available. FINDINGS: No fracture or dislocation is seen. The joint spaces are preserved. Mild dorsal soft tissue swelling along the forefoot. Radiopaque foreign body along the lateral midfoot, described on ankle radiographs. IMPRESSION: No fracture or dislocation is seen. Radiopaque foreign body, described ankle radiographs. Electronically Signed   By: Charline Bills M.D.   On: 06/30/2023 17:40   DG Ankle Complete Left  Result Date: 06/30/2023 CLINICAL DATA:  Fall, left leg pain EXAM: LEFT ANKLE COMPLETE - 3+ VIEW COMPARISON:  None Available. FINDINGS: No fracture or dislocation is seen. The ankle mortise is intact. The base of the fifth metatarsal is unremarkable. Radiopaque foreign body (needle) along the lateral aspect of the midfoot, embedded in the subcutaneous tissues, measuring 16 mm in length. This is along the inferior aspect of the inferior calcaneus on the lateral view. IMPRESSION: No fracture or dislocation is seen. 16 mm radiopaque foreign body (needle) along the lateral aspect of the  midfoot, as described above. Electronically Signed   By: Charline Bills M.D.   On: 06/30/2023 17:39    Procedures Procedures    Medications Ordered in ED Medications - No data to display  ED Course/ Medical Decision Making/ A&P                                 Medical Decision Making Amount and/or Complexity of Data Reviewed Radiology: ordered.  Risk Prescription drug management.   Contusion to left hip and left  knee along with small following body to the left heel.  Patient was given Percocets will follow-up with orthopedics        Final Clinical Impression(s) / ED Diagnoses Final diagnoses:  Fall, initial encounter  Contusion of left hip, initial encounter    Rx / DC Orders ED Discharge Orders          Ordered    oxyCODONE-acetaminophen (PERCOCET/ROXICET) 5-325 MG tablet  Every 6 hours PRN        06/30/23 2304              Bethann Berkshire, MD 07/02/23 1053

## 2023-06-30 NOTE — ED Notes (Signed)
Pt daughter to desk states" mother is getting agitated and yelling at her, and would like DR. To know this behavior has been progressive at home as well".

## 2023-06-30 NOTE — ED Triage Notes (Addendum)
Pt fell last week and twisted left leg. Pt c/o bruising and swelling and pain from foot to thigh. Swellilng noted from knee down to lower foot. Unable to feel pulse due to swelling but foot warm to touch and cap refill less than 3 sec and pt able to move toes and feel touch. Pt a/o.

## 2023-06-30 NOTE — ED Notes (Signed)
Left hip ordered due to pt c/o left hip pain in radiology

## 2023-06-30 NOTE — Discharge Instructions (Addendum)
Follow-up with Dr. Romeo Apple or one of his colleagues next week for recheck of your contusion to your leg.  He can also follow-up on that small piece of metal in your left heel but he will probably just keep an eye on that.  Use a walker to ambulate with

## 2023-07-01 ENCOUNTER — Encounter: Payer: Self-pay | Admitting: Family Medicine

## 2023-07-07 ENCOUNTER — Telehealth: Payer: Self-pay

## 2023-07-07 NOTE — Telephone Encounter (Signed)
Transition Care Management Unsuccessful Follow-up Telephone Call  Date of discharge and from where:  06/30/2023 [facility]  Attempts:  2nd Attempt  Reason for unsuccessful TCM follow-up call:  No answer/busy  Tammy Suarez [facility]  [facility] Population Health Community Resource Care Guide   [email or web address]  ?? [phone]   Website: [email or web address]

## 2023-07-07 NOTE — Telephone Encounter (Signed)
Transition Care Management Unsuccessful Follow-up Telephone Call  Date of discharge and from where:  06/30/2023 [facility]  Attempts:  1st Attempt  Reason for unsuccessful TCM follow-up call:  Left voice message  Makaiya Geerdes [facility]  [facility] Population Health Community Resource Care Guide   ??[email or web address]  ?? [phone]   Website: [email or web address]  [email or web address]

## 2023-07-13 DIAGNOSIS — M5412 Radiculopathy, cervical region: Secondary | ICD-10-CM | POA: Diagnosis not present

## 2023-07-13 DIAGNOSIS — G894 Chronic pain syndrome: Secondary | ICD-10-CM | POA: Diagnosis not present

## 2023-07-13 DIAGNOSIS — M25562 Pain in left knee: Secondary | ICD-10-CM | POA: Diagnosis not present

## 2023-07-13 DIAGNOSIS — Z79891 Long term (current) use of opiate analgesic: Secondary | ICD-10-CM | POA: Diagnosis not present

## 2023-07-13 DIAGNOSIS — M545 Low back pain, unspecified: Secondary | ICD-10-CM | POA: Diagnosis not present

## 2023-07-13 DIAGNOSIS — M25561 Pain in right knee: Secondary | ICD-10-CM | POA: Diagnosis not present

## 2023-07-13 DIAGNOSIS — M542 Cervicalgia: Secondary | ICD-10-CM | POA: Diagnosis not present

## 2023-07-30 ENCOUNTER — Ambulatory Visit: Payer: Medicare HMO | Attending: Student | Admitting: Student

## 2023-07-30 NOTE — Progress Notes (Deleted)
Cardiology Office Note    Date:  07/30/2023  ID:  Tammy Suarez, DOB 1946-04-20, MRN [id number] Cardiologist: Nona Dell, MD    History of Present Illness:    Tammy Suarez is a 77 y.o. female with past medical history of CAD (s/p CABG in 10/2013 with LIMA-LAD, SVG-OM and SVG-PDA, low-risk NST in 2019), HTN, HLD, Type 2 DM and Stage III CKD who presents to the office today for 55-month follow-up.  She was last examined by Jacolyn Reedy, PA in 05/2023 and had recently been hospitalized with COPD and CHF exacerbation. Was found to have an NSTEMI with peak troponin of 1003 and enzyme elevation was felt to be secondary to demand ischemia and was recommended to consider outpatient ischemic evaluation.  She was found to have a consolidation in the lung concerning for a malignancy and follow-up with pulmonology and her PCP was recommended.  At the time of her visit, she reported having constant breast pain since a biopsy in the past and did experience constant pain along her left chest and shoulder which was relieved with pain medications.  Her pain was overall felt to be atypical for a cardiac etiology and further testing was not pursued.  She was noted to have atrial tachycardia during her admission and a 2-week Zio patch was recommended for further assessment.  This ultimately showed predominantly normal sinus rhythm with an average heart rate of 82 bpm.  She did have frequent PACs representing 70% of total beats and very brief SVT but no sustained arrhythmias.  Toprol-XL was increased to 50 mg in a.m./25 mg in PM.  - Follow-up Chest CT?  ROS: ***  Studies Reviewed:   EKG: EKG is*** ordered today and demonstrates ***   EKG Interpretation Date/Time:    Ventricular Rate:    PR Interval:    QRS Duration:    QT Interval:    QTC Calculation:   R Axis:      Text Interpretation:         NST: 08/2018 There was no ST segment deviation noted during stress. The study is normal. No  myocardial ischemia or scar. This is a low risk study. Nuclear stress EF: 72%.  Echocardiogram: 02/2023 Summary   1. The left ventricle is normal in size with upper normal wall thickness.    2. The left ventricular systolic function is normal, LVEF is visually  estimated at > 55%.    3. The mitral valve leaflets are mildly thickened with normal leaflet  mobility.   4. There is mild to moderate mitral valve regurgitation.    5. The left atrium is moderately dilated in size.    6. The right ventricle is normal in size, with normal systolic function.    7. There is mild to moderate tricuspid regurgitation.    Event Monitor: 05/2023 ZIO monitor reviewed.  13 days 20 hours analyzed.   Predominant rhythm is sinus with heart rate ranging from 65 bpm up to 134 bpm and average heart rate 82 bpm. There were frequent PACs representing 17% total beats, otherwise occasional atrial couplets and triplets.  Three very brief episodes of SVT were noted, nonsustained. Rare PVCs were noted representing less than 1% total beats. There were no significant pauses or heart block.  Risk Assessment/Calculations:   {Does this patient have ATRIAL FIBRILLATION?:[id number]} No BP recorded.  {Refresh Note OR Click here to enter BP  :1}***         Physical Exam:   VS:  There were  no vitals taken for this visit.   Wt Readings from Last 3 Encounters:  06/30/23 186 lb 15.2 oz (84.8 kg)  05/24/23 187 lb (84.8 kg)  05/05/23 185 lb (83.9 kg)     GEN: Well nourished, well developed in no acute distress NECK: No JVD; No carotid bruits CARDIAC: ***RRR, no murmurs, rubs, gallops RESPIRATORY:  Clear to auscultation without rales, wheezing or rhonchi  ABDOMEN: Appears non-distended. No obvious abdominal masses. EXTREMITIES: No clubbing or cyanosis. No edema.  Distal pedal pulses are 2+ bilaterally.   Assessment and Plan:   1. CAD - She is s/p CABG in 10/2013 with LIMA-LAD, SVG-OM and SVG-PDA. Most recent  ischemic evaluation was a low-risk NST in 2019. ***  2. HTN - ***  3. HLD - LDL was 18 when checked in 04/2023.  4. Stage 3 CKD - Creatinine was elevated to 1.6 - 1.8 in 2023 but had improved to 1.08 when checked in 04/2023.  5. Abnormal Chest CT - CTA Chest during her admission in 02/2023 showed a paramedian suprahilar masslike consolidation of the right upper lobe which was new compared to prior examination along with extensive heterogeneous and consolidative airspace opacity in the right upper lobe which was concerning for primary lung malignancy. ***    Signed, Ellsworth Lennox, PA-C

## 2023-07-31 ENCOUNTER — Telehealth: Payer: Self-pay | Admitting: Family Medicine

## 2023-07-31 NOTE — Telephone Encounter (Signed)
Needs in office appt with family member in next 2 weeks, close gaps, flu vaccine, foot exam, urine ACR, possibly retinal scree MOST important reason is to follow up on/discuss abn chest scan from outside hosp in 02/2023, highly suggestive of primary lung cancer. I have not discussed this with her , cardiology who was hoping to discuss at visit, but she NS as a she has several of her recent appts, I think, with [place].Pls ask Brandi to over ride and put in the appt once  you have spoken to pt ( you may not need to involve [facility]) Cardiology messaged me on this

## 2023-08-10 DIAGNOSIS — M542 Cervicalgia: Secondary | ICD-10-CM | POA: Diagnosis not present

## 2023-08-10 DIAGNOSIS — M25561 Pain in right knee: Secondary | ICD-10-CM | POA: Diagnosis not present

## 2023-08-10 DIAGNOSIS — G894 Chronic pain syndrome: Secondary | ICD-10-CM | POA: Diagnosis not present

## 2023-08-10 DIAGNOSIS — Z79891 Long term (current) use of opiate analgesic: Secondary | ICD-10-CM | POA: Diagnosis not present

## 2023-08-10 DIAGNOSIS — M545 Low back pain, unspecified: Secondary | ICD-10-CM | POA: Diagnosis not present

## 2023-08-10 DIAGNOSIS — M5412 Radiculopathy, cervical region: Secondary | ICD-10-CM | POA: Diagnosis not present

## 2023-08-10 DIAGNOSIS — M25562 Pain in left knee: Secondary | ICD-10-CM | POA: Diagnosis not present

## 2023-08-12 NOTE — Telephone Encounter (Signed)
Called patient daughter will call back if worse.

## 2023-08-16 ENCOUNTER — Other Ambulatory Visit: Payer: Self-pay | Admitting: Family Medicine

## 2023-08-25 DIAGNOSIS — N1831 Chronic kidney disease, stage 3a: Secondary | ICD-10-CM | POA: Diagnosis not present

## 2023-08-25 DIAGNOSIS — E669 Obesity, unspecified: Secondary | ICD-10-CM | POA: Diagnosis not present

## 2023-08-25 DIAGNOSIS — I252 Old myocardial infarction: Secondary | ICD-10-CM | POA: Diagnosis not present

## 2023-08-25 DIAGNOSIS — Z008 Encounter for other general examination: Secondary | ICD-10-CM | POA: Diagnosis not present

## 2023-08-25 DIAGNOSIS — K219 Gastro-esophageal reflux disease without esophagitis: Secondary | ICD-10-CM | POA: Diagnosis not present

## 2023-08-25 DIAGNOSIS — M1990 Unspecified osteoarthritis, unspecified site: Secondary | ICD-10-CM | POA: Diagnosis not present

## 2023-08-25 DIAGNOSIS — M810 Age-related osteoporosis without current pathological fracture: Secondary | ICD-10-CM | POA: Diagnosis not present

## 2023-08-25 DIAGNOSIS — I471 Supraventricular tachycardia, unspecified: Secondary | ICD-10-CM | POA: Diagnosis not present

## 2023-08-25 DIAGNOSIS — M4800 Spinal stenosis, site unspecified: Secondary | ICD-10-CM | POA: Diagnosis not present

## 2023-08-25 DIAGNOSIS — E785 Hyperlipidemia, unspecified: Secondary | ICD-10-CM | POA: Diagnosis not present

## 2023-08-25 DIAGNOSIS — E1151 Type 2 diabetes mellitus with diabetic peripheral angiopathy without gangrene: Secondary | ICD-10-CM | POA: Diagnosis not present

## 2023-08-25 DIAGNOSIS — E039 Hypothyroidism, unspecified: Secondary | ICD-10-CM | POA: Diagnosis not present

## 2023-08-25 DIAGNOSIS — I25119 Atherosclerotic heart disease of native coronary artery with unspecified angina pectoris: Secondary | ICD-10-CM | POA: Diagnosis not present

## 2023-09-04 ENCOUNTER — Inpatient Hospital Stay (HOSPITAL_COMMUNITY)
Admission: EM | Admit: 2023-09-04 | Discharge: 2023-09-16 | DRG: 193 | Disposition: A | Discharge status: Hospice | Payer: Medicare HMO | Attending: Internal Medicine | Admitting: Internal Medicine

## 2023-09-04 ENCOUNTER — Other Ambulatory Visit: Payer: Self-pay

## 2023-09-04 ENCOUNTER — Encounter (HOSPITAL_COMMUNITY): Payer: Self-pay

## 2023-09-04 ENCOUNTER — Emergency Department (HOSPITAL_COMMUNITY): Payer: Medicare HMO

## 2023-09-04 DIAGNOSIS — Z6834 Body mass index (BMI) 34.0-34.9, adult: Secondary | ICD-10-CM

## 2023-09-04 DIAGNOSIS — Z79890 Hormone replacement therapy (postmenopausal): Secondary | ICD-10-CM

## 2023-09-04 DIAGNOSIS — R0989 Other specified symptoms and signs involving the circulatory and respiratory systems: Secondary | ICD-10-CM | POA: Diagnosis not present

## 2023-09-04 DIAGNOSIS — I5032 Chronic diastolic (congestive) heart failure: Secondary | ICD-10-CM | POA: Diagnosis not present

## 2023-09-04 DIAGNOSIS — Z886 Allergy status to analgesic agent status: Secondary | ICD-10-CM

## 2023-09-04 DIAGNOSIS — Z7983 Long term (current) use of bisphosphonates: Secondary | ICD-10-CM

## 2023-09-04 DIAGNOSIS — N1832 Chronic kidney disease, stage 3b: Secondary | ICD-10-CM | POA: Diagnosis not present

## 2023-09-04 DIAGNOSIS — E669 Obesity, unspecified: Secondary | ICD-10-CM | POA: Diagnosis present

## 2023-09-04 DIAGNOSIS — F17210 Nicotine dependence, cigarettes, uncomplicated: Secondary | ICD-10-CM | POA: Diagnosis present

## 2023-09-04 DIAGNOSIS — E039 Hypothyroidism, unspecified: Secondary | ICD-10-CM | POA: Diagnosis not present

## 2023-09-04 DIAGNOSIS — F05 Delirium due to known physiological condition: Secondary | ICD-10-CM | POA: Diagnosis not present

## 2023-09-04 DIAGNOSIS — D696 Thrombocytopenia, unspecified: Secondary | ICD-10-CM | POA: Diagnosis present

## 2023-09-04 DIAGNOSIS — I4719 Other supraventricular tachycardia: Secondary | ICD-10-CM | POA: Diagnosis not present

## 2023-09-04 DIAGNOSIS — Z79899 Other long term (current) drug therapy: Secondary | ICD-10-CM

## 2023-09-04 DIAGNOSIS — F0394 Unspecified dementia, unspecified severity, with anxiety: Secondary | ICD-10-CM | POA: Diagnosis not present

## 2023-09-04 DIAGNOSIS — M549 Dorsalgia, unspecified: Secondary | ICD-10-CM | POA: Diagnosis present

## 2023-09-04 DIAGNOSIS — Z8249 Family history of ischemic heart disease and other diseases of the circulatory system: Secondary | ICD-10-CM

## 2023-09-04 DIAGNOSIS — I10 Essential (primary) hypertension: Secondary | ICD-10-CM | POA: Diagnosis present

## 2023-09-04 DIAGNOSIS — D539 Nutritional anemia, unspecified: Secondary | ICD-10-CM | POA: Diagnosis present

## 2023-09-04 DIAGNOSIS — N179 Acute kidney failure, unspecified: Secondary | ICD-10-CM | POA: Diagnosis present

## 2023-09-04 DIAGNOSIS — I252 Old myocardial infarction: Secondary | ICD-10-CM

## 2023-09-04 DIAGNOSIS — Z66 Do not resuscitate: Secondary | ICD-10-CM | POA: Diagnosis not present

## 2023-09-04 DIAGNOSIS — I21A1 Myocardial infarction type 2: Secondary | ICD-10-CM | POA: Diagnosis not present

## 2023-09-04 DIAGNOSIS — Z1152 Encounter for screening for COVID-19: Secondary | ICD-10-CM

## 2023-09-04 DIAGNOSIS — N183 Chronic kidney disease, stage 3 unspecified: Secondary | ICD-10-CM | POA: Diagnosis present

## 2023-09-04 DIAGNOSIS — F32A Depression, unspecified: Secondary | ICD-10-CM | POA: Diagnosis not present

## 2023-09-04 DIAGNOSIS — R918 Other nonspecific abnormal finding of lung field: Secondary | ICD-10-CM | POA: Diagnosis not present

## 2023-09-04 DIAGNOSIS — F03918 Unspecified dementia, unspecified severity, with other behavioral disturbance: Secondary | ICD-10-CM | POA: Diagnosis present

## 2023-09-04 DIAGNOSIS — I130 Hypertensive heart and chronic kidney disease with heart failure and stage 1 through stage 4 chronic kidney disease, or unspecified chronic kidney disease: Secondary | ICD-10-CM | POA: Diagnosis not present

## 2023-09-04 DIAGNOSIS — J189 Pneumonia, unspecified organism: Principal | ICD-10-CM | POA: Diagnosis present

## 2023-09-04 DIAGNOSIS — E440 Moderate protein-calorie malnutrition: Secondary | ICD-10-CM | POA: Diagnosis not present

## 2023-09-04 DIAGNOSIS — J440 Chronic obstructive pulmonary disease with acute lower respiratory infection: Secondary | ICD-10-CM | POA: Diagnosis not present

## 2023-09-04 DIAGNOSIS — R059 Cough, unspecified: Secondary | ICD-10-CM | POA: Diagnosis not present

## 2023-09-04 DIAGNOSIS — E1122 Type 2 diabetes mellitus with diabetic chronic kidney disease: Secondary | ICD-10-CM | POA: Diagnosis present

## 2023-09-04 DIAGNOSIS — E785 Hyperlipidemia, unspecified: Secondary | ICD-10-CM | POA: Diagnosis present

## 2023-09-04 DIAGNOSIS — Z9884 Bariatric surgery status: Secondary | ICD-10-CM

## 2023-09-04 DIAGNOSIS — J9811 Atelectasis: Secondary | ICD-10-CM | POA: Diagnosis not present

## 2023-09-04 DIAGNOSIS — E1129 Type 2 diabetes mellitus with other diabetic kidney complication: Secondary | ICD-10-CM | POA: Diagnosis present

## 2023-09-04 DIAGNOSIS — Z885 Allergy status to narcotic agent status: Secondary | ICD-10-CM

## 2023-09-04 DIAGNOSIS — K219 Gastro-esophageal reflux disease without esophagitis: Secondary | ICD-10-CM | POA: Diagnosis present

## 2023-09-04 DIAGNOSIS — F03911 Unspecified dementia, unspecified severity, with agitation: Secondary | ICD-10-CM | POA: Diagnosis present

## 2023-09-04 DIAGNOSIS — I2510 Atherosclerotic heart disease of native coronary artery without angina pectoris: Secondary | ICD-10-CM | POA: Diagnosis present

## 2023-09-04 DIAGNOSIS — I672 Cerebral atherosclerosis: Secondary | ICD-10-CM | POA: Diagnosis not present

## 2023-09-04 DIAGNOSIS — I700 Atherosclerosis of aorta: Secondary | ICD-10-CM | POA: Diagnosis not present

## 2023-09-04 DIAGNOSIS — Z981 Arthrodesis status: Secondary | ICD-10-CM

## 2023-09-04 DIAGNOSIS — Z7189 Other specified counseling: Secondary | ICD-10-CM | POA: Diagnosis not present

## 2023-09-04 DIAGNOSIS — G8929 Other chronic pain: Secondary | ICD-10-CM | POA: Diagnosis present

## 2023-09-04 DIAGNOSIS — R531 Weakness: Secondary | ICD-10-CM

## 2023-09-04 DIAGNOSIS — I480 Paroxysmal atrial fibrillation: Secondary | ICD-10-CM | POA: Diagnosis present

## 2023-09-04 DIAGNOSIS — Z951 Presence of aortocoronary bypass graft: Secondary | ICD-10-CM

## 2023-09-04 DIAGNOSIS — I340 Nonrheumatic mitral (valve) insufficiency: Secondary | ICD-10-CM | POA: Diagnosis present

## 2023-09-04 DIAGNOSIS — R079 Chest pain, unspecified: Secondary | ICD-10-CM | POA: Diagnosis not present

## 2023-09-04 DIAGNOSIS — Z515 Encounter for palliative care: Secondary | ICD-10-CM | POA: Diagnosis not present

## 2023-09-04 DIAGNOSIS — E11649 Type 2 diabetes mellitus with hypoglycemia without coma: Secondary | ICD-10-CM | POA: Diagnosis present

## 2023-09-04 DIAGNOSIS — E66811 Obesity, class 1: Secondary | ICD-10-CM | POA: Diagnosis present

## 2023-09-04 DIAGNOSIS — R7989 Other specified abnormal findings of blood chemistry: Secondary | ICD-10-CM | POA: Diagnosis not present

## 2023-09-04 DIAGNOSIS — Z888 Allergy status to other drugs, medicaments and biological substances status: Secondary | ICD-10-CM

## 2023-09-04 DIAGNOSIS — R911 Solitary pulmonary nodule: Secondary | ICD-10-CM | POA: Diagnosis not present

## 2023-09-04 DIAGNOSIS — R0789 Other chest pain: Secondary | ICD-10-CM | POA: Diagnosis not present

## 2023-09-04 LAB — TROPONIN I (HIGH SENSITIVITY)
Troponin I (High Sensitivity): 24 ng/L — ABNORMAL HIGH (ref <18)
Troponin I (High Sensitivity): 23 ng/L — ABNORMAL HIGH (ref <18)

## 2023-09-04 LAB — CBC WITH DIFFERENTIAL/PLATELET
Abs Immature Granulocytes: 0.01 10*3/uL (ref 0.00–0.07)
Basophils Absolute: 0 10*3/uL (ref 0.0–0.1)
Basophils Relative: 1 %
Eosinophils Absolute: 0.2 10*3/uL (ref 0.0–0.5)
Eosinophils Relative: 5 %
HCT: 28.8 % — ABNORMAL LOW (ref 36.0–46.0)
Hemoglobin: 9.3 g/dL — ABNORMAL LOW (ref 12.0–15.0)
Immature Granulocytes: 0 %
Lymphocytes Relative: 33 %
Lymphs Abs: 1.5 10*3/uL (ref 0.7–4.0)
MCH: 33.5 pg (ref 26.0–34.0)
MCHC: 32.3 g/dL (ref 30.0–36.0)
MCV: 103.6 fL — ABNORMAL HIGH (ref 80.0–100.0)
Monocytes Absolute: 0.8 10*3/uL (ref 0.1–1.0)
Monocytes Relative: 18 %
Neutro Abs: 1.9 10*3/uL (ref 1.7–7.7)
Neutrophils Relative %: 43 %
Platelets: 117 10*3/uL — ABNORMAL LOW (ref 150–400)
RBC: 2.78 MIL/uL — ABNORMAL LOW (ref 3.87–5.11)
RDW: 14.6 % (ref 11.5–15.5)
WBC: 4.4 10*3/uL (ref 4.0–10.5)
nRBC: 0 % (ref 0.0–0.2)

## 2023-09-04 LAB — TSH: TSH: 2.599 u[IU]/mL (ref 0.350–4.500)

## 2023-09-04 LAB — URINALYSIS, ROUTINE W REFLEX MICROSCOPIC
Bacteria, UA: NONE SEEN
Bilirubin Urine: NEGATIVE
Glucose, UA: NEGATIVE mg/dL
Hgb urine dipstick: NEGATIVE
Ketones, ur: NEGATIVE mg/dL
Leukocytes,Ua: NEGATIVE
Nitrite: NEGATIVE
Protein, ur: 100 mg/dL — AB
Specific Gravity, Urine: 1.012 (ref 1.005–1.030)
pH: 5 (ref 5.0–8.0)

## 2023-09-04 LAB — COMPREHENSIVE METABOLIC PANEL
ALT: 11 U/L (ref 0–44)
AST: 24 U/L (ref 15–41)
Albumin: 2.5 g/dL — ABNORMAL LOW (ref 3.5–5.0)
Alkaline Phosphatase: 84 U/L (ref 38–126)
Anion gap: 8 (ref 5–15)
BUN: 26 mg/dL — ABNORMAL HIGH (ref 8–23)
CO2: 25 mmol/L (ref 22–32)
Calcium: 8.8 mg/dL — ABNORMAL LOW (ref 8.9–10.3)
Chloride: 101 mmol/L (ref 98–111)
Creatinine, Ser: 1.48 mg/dL — ABNORMAL HIGH (ref 0.44–1.00)
GFR, Estimated: 36 mL/min — ABNORMAL LOW (ref >60)
Glucose, Bld: 140 mg/dL — ABNORMAL HIGH (ref 70–99)
Potassium: 3.7 mmol/L (ref 3.5–5.1)
Sodium: 134 mmol/L — ABNORMAL LOW (ref 135–145)
Total Bilirubin: 1 mg/dL (ref 0.3–1.2)
Total Protein: 6.1 g/dL — ABNORMAL LOW (ref 6.5–8.1)

## 2023-09-04 LAB — SARS CORONAVIRUS 2 BY RT PCR: SARS Coronavirus 2 by RT PCR: NEGATIVE

## 2023-09-04 LAB — BRAIN NATRIURETIC PEPTIDE: B Natriuretic Peptide: 601 pg/mL — ABNORMAL HIGH (ref 0.0–100.0)

## 2023-09-04 LAB — T4, FREE: Free T4: 1.64 ng/dL — ABNORMAL HIGH (ref 0.61–1.12)

## 2023-09-04 MED ORDER — METOPROLOL SUCCINATE ER 50 MG PO TB24
50.0000 mg | ORAL_TABLET | Freq: Every day | ORAL | Status: DC
  Administered 2023-09-05 – 2023-09-11 (×6): 50 mg via ORAL
  Filled 2023-09-04 (×7): qty 1

## 2023-09-04 MED ORDER — ASPIRIN 81 MG PO CHEW
324.0000 mg | CHEWABLE_TABLET | Freq: Once | ORAL | Status: AC
  Administered 2023-09-04: 324 mg via ORAL
  Filled 2023-09-04: qty 4

## 2023-09-04 MED ORDER — SODIUM CHLORIDE 0.9 % IV SOLN
2.0000 g | INTRAVENOUS | Status: AC
  Administered 2023-09-05 – 2023-09-08 (×4): 2 g via INTRAVENOUS
  Filled 2023-09-04 (×4): qty 20

## 2023-09-04 MED ORDER — LEVOTHYROXINE SODIUM 25 MCG PO TABS
125.0000 ug | ORAL_TABLET | Freq: Every day | ORAL | Status: DC
  Administered 2023-09-05 – 2023-09-11 (×7): 125 ug via ORAL
  Filled 2023-09-04 (×7): qty 1

## 2023-09-04 MED ORDER — METOPROLOL SUCCINATE ER 25 MG PO TB24: 25.0000 mg | ORAL_TABLET | Freq: Every day | ORAL | Status: DC

## 2023-09-04 MED ORDER — IPRATROPIUM BROMIDE 0.02 % IN SOLN
RESPIRATORY_TRACT | Status: AC
  Administered 2023-09-04: 0.5 mg
  Filled 2023-09-04: qty 2.5

## 2023-09-04 MED ORDER — ENOXAPARIN SODIUM 40 MG/0.4ML IJ SOSY
40.0000 mg | PREFILLED_SYRINGE | INTRAMUSCULAR | Status: DC
Start: 2023-09-04 — End: 2023-09-11
  Administered 2023-09-04 – 2023-09-10 (×6): 40 mg via SUBCUTANEOUS
  Filled 2023-09-04 (×6): qty 0.4

## 2023-09-04 MED ORDER — HALOPERIDOL LACTATE 5 MG/ML IJ SOLN
2.0000 mg | Freq: Once | INTRAMUSCULAR | Status: AC
  Administered 2023-09-04: 2 mg via INTRAVENOUS
  Filled 2023-09-04: qty 1

## 2023-09-04 MED ORDER — GUAIFENESIN ER 600 MG PO TB12
600.0000 mg | ORAL_TABLET | Freq: Two times a day (BID) | ORAL | Status: DC
  Administered 2023-09-04 – 2023-09-16 (×20): 600 mg via ORAL
  Filled 2023-09-04 (×21): qty 1

## 2023-09-04 MED ORDER — ONDANSETRON HCL 4 MG/2ML IJ SOLN: 4.0000 mg | Freq: Three times a day (TID) | INTRAMUSCULAR | Status: DC | PRN

## 2023-09-04 MED ORDER — FUROSEMIDE 10 MG/ML IJ SOLN
20.0000 mg | Freq: Once | INTRAMUSCULAR | Status: AC
Start: 2023-09-04 — End: 2023-09-04
  Administered 2023-09-04: 20 mg via INTRAVENOUS
  Filled 2023-09-04: qty 2

## 2023-09-04 MED ORDER — LOSARTAN POTASSIUM 50 MG PO TABS
100.0000 mg | ORAL_TABLET | Freq: Every day | ORAL | Status: DC
  Administered 2023-09-05 – 2023-09-11 (×6): 100 mg via ORAL
  Filled 2023-09-04 (×7): qty 2

## 2023-09-04 MED ORDER — ONDANSETRON HCL 4 MG PO TABS: 4.0000 mg | ORAL_TABLET | Freq: Three times a day (TID) | ORAL | Status: DC | PRN

## 2023-09-04 MED ORDER — SODIUM CHLORIDE 0.9 % IV SOLN
1.0000 g | Freq: Once | INTRAVENOUS | Status: AC
  Administered 2023-09-04: 1 g via INTRAVENOUS
  Filled 2023-09-04: qty 10

## 2023-09-04 MED ORDER — ISOSORBIDE MONONITRATE ER 60 MG PO TB24
30.0000 mg | ORAL_TABLET | Freq: Every day | ORAL | Status: DC
  Administered 2023-09-05 – 2023-09-09 (×3): 30 mg via ORAL
  Filled 2023-09-04 (×4): qty 1

## 2023-09-04 MED ORDER — METOPROLOL SUCCINATE ER 25 MG PO TB24
25.0000 mg | ORAL_TABLET | Freq: Every day | ORAL | Status: DC
  Administered 2023-09-05 – 2023-09-09 (×3): 25 mg via ORAL
  Filled 2023-09-04 (×4): qty 1

## 2023-09-04 MED ORDER — AZITHROMYCIN 250 MG PO TABS
500.0000 mg | ORAL_TABLET | Freq: Every day | ORAL | Status: AC
  Administered 2023-09-05 – 2023-09-08 (×4): 500 mg via ORAL
  Filled 2023-09-04 (×4): qty 2

## 2023-09-04 MED ORDER — SODIUM CHLORIDE 0.9 % IV SOLN
500.0000 mg | Freq: Once | INTRAVENOUS | Status: AC
  Administered 2023-09-04: 500 mg via INTRAVENOUS
  Filled 2023-09-04: qty 5

## 2023-09-04 MED ORDER — ALBUTEROL SULFATE (2.5 MG/3ML) 0.083% IN NEBU
3.0000 mL | INHALATION_SOLUTION | RESPIRATORY_TRACT | Status: DC | PRN
  Administered 2023-09-04 – 2023-09-07 (×3): 3 mL via RESPIRATORY_TRACT
  Filled 2023-09-04 (×3): qty 3

## 2023-09-04 NOTE — H&P (Signed)
History and Physical    Patient: Tammy Suarez ZOX:[id number] DOB: [birth year] DOA: 09/04/2023 DOS: the patient was seen and examined on 09/04/2023 PCP: Kerri Perches, MD  Patient coming from: Home  Chief Complaint:  Chief Complaint  Patient presents with   Cough   Nasal Congestion   Weakness   HPI: Tammy Suarez is a 76 y.o. female with medical history significant of A-fib, CHF, CKD, GERD, HLD, and hypothyroidism who presented to the ED for evaluation of generalized weakness and cough.  Per patient and family patient has not felt well for over 3 weeks.  Over the last 4 to 5 days, she had developed cough and cold symptoms.  She has had a productive cough with yellowish/greenish sputum.  Per family, patient has slid down her bed multiple times due to generalized weakness.  She reports associated nasal congestion and occasional shortness of breath but denies any fevers, chills, chest pain, abdominal pain or headaches.  ED course: Hypertensive with SBP in the 150s to 160s but afebrile and on room air.  Significant labs includes creatinine 1.48, negative troponins, negative COVID test, negative UA, BNP of 601 and normal TSH. CXR shows evidence of left lower lung pneumonia superimposed on small left pleural effusion. Patient started on ceftriaxone and azithromycin and hospitalist consulted to admit for further manage treatment of community-acquired pneumonia.   Review of Systems: As mentioned in the history of present illness. All other systems reviewed and are negative. Past Medical History:  Diagnosis Date   Arthritis    Atrial fibrillation (HCC)    Carpal tunnel syndrome    CHF (congestive heart failure) (HCC)    Chronic back pain    CKD (chronic kidney disease) stage 3, GFR 30-59 ml/min (HCC)    Coronary atherosclerosis of native coronary artery    Multivessel status post CABG - LIMA to LAD, SVG to OM and SVG to PDA   Depression    Economic stress    Essential hypertension     GERD (gastroesophageal reflux disease)    Headache    History of conjunctivitis    History of hiatal hernia    Hyperlipidemia    Hypothyroidism    Myocardial infarction (HCC) 1999   Obesity    Type 2 diabetes mellitus (HCC)    Uterine cancer (HCC)    Vitamin B 12 deficiency    Past Surgical History:  Procedure Laterality Date   ABDOMINAL HYSTERECTOMY     BACK SURGERY     BREAST BIOPSY Left 04/02/2022   BENIGN BREAST PARENCHYMA WITH FOCAL DYSTROPHIC CALCIFICATION   CARDIAC SURGERY     CARPAL TUNNEL RELEASE Left 07/23/2016   Procedure: CARPAL TUNNEL RELEASE;  Surgeon: Vickki Hearing, MD;  Location: [facility];  Service: Orthopedics;  Laterality: Left;   CERVICAL LAMINECTOMY     CHOLECYSTECTOMY     COLONOSCOPY  2007   Dr. Elpidio Anis: reported to be normal   COLONOSCOPY WITH PROPOFOL N/A 04/29/2020   Procedure: COLONOSCOPY WITH PROPOFOL;  Surgeon: Corbin Ade, MD;  Location: [facility];  Service: Endoscopy;  Laterality: N/A;  12:45pm   CORONARY ARTERY BYPASS GRAFT N/A 11/02/2013   Procedure: CORONARY ARTERY BYPASS GRAFTING (CABG);  Surgeon: Alleen Borne, MD;  Location: [facility] OR;  Service: Open Heart Surgery;  Laterality: N/A;  CABG x three, using left internal mammary artery and right leg greater saphenous vein harvested endoscopically   COSMETIC SURGERY  07/01/2012   Recurrent skin infection on lower abdomen, [facility]  HERNIA REPAIR     Inguinal herniorrhaphy     INTRAOPERATIVE TRANSESOPHAGEAL ECHOCARDIOGRAM N/A 11/02/2013   Procedure: INTRAOPERATIVE TRANSESOPHAGEAL ECHOCARDIOGRAM;  Surgeon: Alleen Borne, MD;  Location: [facility] OR;  Service: Open Heart Surgery;  Laterality: N/A;   LAPAROSCOPIC GASTRIC BANDING  2009   LEFT HEART CATHETERIZATION WITH CORONARY ANGIOGRAM N/A 10/30/2013   Procedure: LEFT HEART CATHETERIZATION WITH CORONARY ANGIOGRAM;  Surgeon: Rollene Rotunda, MD;  Location: [facility] CATH LAB;  Service: Cardiovascular;  Laterality: N/A;   Left index finger repair      LUMBAR FUSION     POLYPECTOMY  04/29/2020   Procedure: POLYPECTOMY;  Surgeon: Corbin Ade, MD;  Location: [facility];  Service: Endoscopy;;   SPINE SURGERY     VESICOVAGINAL FISTULA CLOSURE W/ TAH     Social History:  reports that she has been smoking cigarettes. She has a 12.5 pack-year smoking history. She has never used smokeless tobacco. She reports that she does not drink alcohol and does not use drugs.  Allergies  Allergen Reactions   Gabapentin Other (See Comments)    Feels dizzy   Meperidine Hcl Nausea And Vomiting   Niacin Other (See Comments)    REACTION: face peeling and burning   Propoxyphene N-Acetaminophen Nausea And Vomiting   Hydrocodone-Acetaminophen Nausea And Vomiting    Family History  Problem Relation Age of Onset   Heart attack Mother    Heart attack Father    Hypertension Sister    Heart attack Sister    Cancer Brother        Renal cell    Hepatitis Daughter    Drug abuse Daughter    Hypertension Daughter    Colon cancer Neg Hx     Prior to Admission medications   Medication Sig Start Date End Date Taking? Authorizing Provider  alendronate (FOSAMAX) 70 MG tablet TAKE 1 TABLET(70 MG) BY MOUTH EVERY 7 DAYS WITH A FULL GLASS OF WATER AND ON AN EMPTY STOMACH 05/19/23   Del Nigel Berthold, FNP  betamethasone dipropionate (DIPROLENE) 0.05 % ointment Apply sparingly twice daily to rash on right forearm for 2 weeks, then stop, and apply sparingly once daily to rash on left cheek for 1 week, then stop 05/05/23   Kerri Perches, MD  blood glucose meter kit and supplies Dispense based on patient and insurance preference.Once daily testing dx e11.9. 03/25/21   Kerri Perches, MD  Calcium-Magnesium-Vitamin D (CALCIUM 1200+D3 PO) Take by mouth.    [provider]  DULoxetine (CYMBALTA) 60 MG capsule Take 1 capsule (60 mg total) by mouth 2 (two) times daily. 05/11/23   Kerri Perches, MD  ezetimibe (ZETIA) 10 MG tablet TAKE 1 TABLET(10  MG) BY MOUTH DAILY 10/06/22   Kerri Perches, MD  famotidine (PEPCID) 20 MG tablet TAKE 1 TABLET(20 MG) BY MOUTH TWICE DAILY 08/16/23   Kerri Perches, MD  fenofibrate (TRICOR) 145 MG tablet TAKE 1 TABLET BY MOUTH EVERY DAY 02/01/23   Kerri Perches, MD  isosorbide mononitrate (IMDUR) 30 MG 24 hr tablet TAKE 1 TABLET(30 MG) BY MOUTH AT BEDTIME 11/26/22   Charlsie Quest, NP  levothyroxine (SYNTHROID) 125 MCG tablet Take 1 tablet (125 mcg total) by mouth daily. 05/06/23   Kerri Perches, MD  losartan (COZAAR) 100 MG tablet Take 1 tablet (100 mg total) by mouth daily. 05/11/23   Kerri Perches, MD  metoprolol succinate (TOPROL-XL) 50 MG 24 hr tablet Take 50 mg (1 tablet) every morning  and 25 mg (1/2 tablet) at bedtime 06/15/23   Dyann Kief, PA-C  multivitamin-iron-minerals-folic acid (CENTRUM) chewable tablet Chew 1 tablet by mouth daily.    [provider]  nitroGLYCERIN (NITROSTAT) 0.4 MG SL tablet Place 1 tablet (0.4 mg total) under the tongue every 5 (five) minutes as needed for chest pain. 11/26/22   Charlsie Quest, NP  Omega-3 Fatty Acids (FISH OIL) 1200 MG CAPS Take by mouth.    [provider]  ondansetron (ZOFRAN) 4 MG tablet Take one tablet by mouth once daily, as needed, for nausea 12/09/22   Kerri Perches, MD  oxyCODONE-acetaminophen (PERCOCET/ROXICET) 5-325 MG tablet Take 1 tablet by mouth every 6 (six) hours as needed for severe pain. 06/30/23   Bethann Berkshire, MD  pregabalin (LYRICA) 25 MG capsule Take 1 capsule (25 mg total) by mouth daily. 12/04/22   Kerri Perches, MD  rosuvastatin (CRESTOR) 40 MG tablet TAKE 1 TABLET(40 MG) BY MOUTH DAILY 05/07/23   Kerri Perches, MD    Physical Exam: Vitals:   09/04/23 1600 09/04/23 1639 09/04/23 1800 09/04/23 1813  BP: (!) 148/52  (!) 148/55   Pulse: 75     Resp: 18 18 14    Temp: 97.7 F (36.5 C)   97.8 F (36.6 C)  TempSrc: Oral   Oral  SpO2: 100%     Weight:      Height:       General: Obese elderly woman laying in bed. No acute distress. HEENT: [place]/AT. Dry mucous membrane. CV: Irregularly irregular rhythm. Regular rate. No murmurs, rubs, or gallops. No LE edema Pulmonary: On room air. Lungs CTAB. Normal effort. No wheezing or rales. Abdominal: Soft, nontender, nondistended. Normal bowel sounds. Extremities: Palpable radial and DP pulses. Normal ROM. Skin: Warm and dry. Multiple healed bruises on upper extremities. Neuro: A&Ox3. Moves all extremities. Normal sensation.   Data Reviewed:  There are no new results to review at this time. EKG: Multiple artifacts but looks sinus with possible PACs Negative UA, troponin downtrending  Assessment and Plan: Tammy Suarez is a 77 y.o. female with medical history significant of A-fib, CHF, CKD, GERD, HLD, and hypothyroidism who presented to the ED for evaluation of generalized weakness and cough and found to have community-acquired pneumonia  #Community-acquired pneumonia #Generalized weakness Elderly woman with a 3 weeks history of generalized weakness as well as 5-6 daily history of respiratory symptoms including nasal congestion and productive cough found to have infiltrate in her left lung base.  She is hemodynamically stable and on room air.  COVID-19 negative so will check for other possible viral infections. -Continue Rocephin and azithromycin for total of 5 days -Mucinex 600 mg twice daily for cough -Urinary strep pneumo, Legionella, full RVP -As needed albuterol as needed for wheezing or shortness of breath -Supplemental O2 as needed -PT/OT eval and treat  #HFpEF #Small left pleural effusion Patient found to have small left pleural effusion on chest x-ray. BNP elevated to 600. She remains on room air with no significant respiratory distress. No recent echocardiogram on file but last nuclear stress test in 2019 was normal and showed EF of 72%. No other evidence of heart failure exacerbation. Status post 1 dose  of IV Lasix 20 mg.   -Repeat Lasix dose tomorrow as needed -Supplemental O2 as needed  #Hypertension SBP elevated to the 140s to 160s on admission. Plan to resume her home meds. -Resume Imdur, losartan and Toprol-XL  #CKD3 Patient found to have a creatinine of 1.48  on admission. Unclear baseline creatinine.   -Check morning BMP  #CAD  S/p CABG in 2014 (LIMA-LAD, SVG- OM, and SVG to PDA). Cardiology following in the outpatient. Holding off on ischemic workup. -Continue Imdur  #Hypothyroidism Normal TSH on admission.  Free T4 pending. -Synthroid 125 mcg daily   Advance Care Planning:   Code Status: Do not attempt resuscitation (DNR) - Comfort care   Consults: None  Family Communication: Discussed admission with daughter and granddaughter at bedside  Severity of Illness: The appropriate patient status for this patient is INPATIENT. Inpatient status is judged to be reasonable and necessary in order to provide the required intensity of service to ensure the patient's safety. The patient's presenting symptoms, physical exam findings, and initial radiographic and laboratory data in the context of their chronic comorbidities is felt to place them at high risk for further clinical deterioration. Furthermore, it is not anticipated that the patient will be medically stable for discharge from the hospital within 2 midnights of admission.   * I certify that at the point of admission it is my clinical judgment that the patient will require inpatient hospital care spanning beyond 2 midnights from the point of admission due to high intensity of service, high risk for further deterioration and high frequency of surveillance required.*  Author: Steffanie Rainwater, MD 09/04/2023 7:07 PM  For on call review [email or web address].
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
## 2023-09-04 NOTE — ED Notes (Addendum)
Family at bedside. 

## 2023-09-04 NOTE — ED Triage Notes (Signed)
Family called EMS because pt was weak and slide down to the floor after going to bathroom. Pt has cough and congestion as well. Pt asking for dinner during triage stating she will get sick if she doesn't eat.

## 2023-09-04 NOTE — ED Provider Notes (Signed)
[place] EMERGENCY DEPARTMENT AT [facility] Provider Note  CSN: [id number] Arrival date & time: 09/04/23 1206  Chief Complaint(s) Cough, Nasal Congestion, and Weakness  HPI Tammy Suarez is a 77 y.o. female with past medical history as below, significant for atrial fibrillation, CHF, CKD, GERD, HLD, hypothyroid who presents to the ED with complaint of weakness, cough congestion  Patient reports has been feeling unwell for approximately 3 weeks now.  Generalized weakness, worsening productive cough with clear or yellow sputum over the past few days.  No chest pain or significant dyspnea.  No abdominal pain nausea or vomiting.  She is requesting something to eat.  No change to bowel or bladder function. Has not taken her daily medications this morning but o/w is compliant with them.   Past Medical History Past Medical History:  Diagnosis Date   Arthritis    Atrial fibrillation (HCC)    Carpal tunnel syndrome    CHF (congestive heart failure) (HCC)    Chronic back pain    CKD (chronic kidney disease) stage 3, GFR 30-59 ml/min (HCC)    Coronary atherosclerosis of native coronary artery    Multivessel status post CABG - LIMA to LAD, SVG to OM and SVG to PDA   Depression    Economic stress    Essential hypertension    GERD (gastroesophageal reflux disease)    Headache    History of conjunctivitis    History of hiatal hernia    Hyperlipidemia    Hypothyroidism    Myocardial infarction (HCC) 1999   Obesity    Type 2 diabetes mellitus (HCC)    Uterine cancer (HCC)    Vitamin B 12 deficiency    Patient Active Problem List   Diagnosis Date Noted   Depression, major, single episode, severe (HCC) 05/11/2023   Dermatitis 05/11/2023   Requires assistance with activities of daily living (ADL) 05/11/2023   Dysuria 82/95/6213   Chronic pain 12/07/2022   Vitamin D deficiency 11/22/2022   Osteoarthritis 01/29/2021   Right shoulder pain 09/18/2020   Positive colorectal  cancer screening using Cologuard test 02/12/2020   Constipation 02/12/2020   Closed fracture of proximal end of right humerus 04/29/19 05/15/2019   Fracture of right shoulder 05/02/2019   Carpal tunnel syndrome, left    Benign mole 10/02/2015   Left carpal tunnel syndrome 12/04/2014   Coronary atherosclerosis of native coronary artery 11/02/2013   CKD (chronic kidney disease) stage 3, GFR 30-59 ml/min (HCC)    DM (diabetes mellitus), type 2 with renal complications (HCC) 09/15/2013   Recurrent falls 07/28/2013   Urinary incontinence 03/23/2013   Nicotine dependence 11/24/2012   Hypothyroidism 09/28/2010   Depression with anxiety 09/28/2010   FATIGUE 02/08/2009   VITAMIN B12 DEFICIENCY 12/09/2007   Hyperlipidemia with target LDL less than 70 12/09/2007   Obesity (BMI 30.0-34.9) 12/09/2007   Essential hypertension, benign 12/09/2007   GERD 12/09/2007   Spinal stenosis of lumbar region with radiculopathy 12/09/2007   Home Medication(s) Prior to Admission medications   Medication Sig Start Date End Date Taking? Authorizing Provider  alendronate (FOSAMAX) 70 MG tablet TAKE 1 TABLET(70 MG) BY MOUTH EVERY 7 DAYS WITH A FULL GLASS OF WATER AND ON AN EMPTY STOMACH 05/19/23   Del Nigel Berthold, FNP  betamethasone dipropionate (DIPROLENE) 0.05 % ointment Apply sparingly twice daily to rash on right forearm for 2 weeks, then stop, and apply sparingly once daily to rash on left cheek for 1 week, then stop 05/05/23  Kerri Perches, MD  blood glucose meter kit and supplies Dispense based on patient and insurance preference.Once daily testing dx e11.9. 03/25/21   Kerri Perches, MD  Calcium-Magnesium-Vitamin D (CALCIUM 1200+D3 PO) Take by mouth.    [provider]  DULoxetine (CYMBALTA) 60 MG capsule Take 1 capsule (60 mg total) by mouth 2 (two) times daily. 05/11/23   Kerri Perches, MD  ezetimibe (ZETIA) 10 MG tablet TAKE 1 TABLET(10 MG) BY MOUTH DAILY 10/06/22   Kerri Perches, MD  famotidine (PEPCID) 20 MG tablet TAKE 1 TABLET(20 MG) BY MOUTH TWICE DAILY 08/16/23   Kerri Perches, MD  fenofibrate (TRICOR) 145 MG tablet TAKE 1 TABLET BY MOUTH EVERY DAY 02/01/23   Kerri Perches, MD  isosorbide mononitrate (IMDUR) 30 MG 24 hr tablet TAKE 1 TABLET(30 MG) BY MOUTH AT BEDTIME 11/26/22   Charlsie Quest, NP  levothyroxine (SYNTHROID) 125 MCG tablet Take 1 tablet (125 mcg total) by mouth daily. 05/06/23   Kerri Perches, MD  losartan (COZAAR) 100 MG tablet Take 1 tablet (100 mg total) by mouth daily. 05/11/23   Kerri Perches, MD  metoprolol succinate (TOPROL-XL) 50 MG 24 hr tablet Take 50 mg (1 tablet) every morning and 25 mg (1/2 tablet) at bedtime 06/15/23   Dyann Kief, PA-C  multivitamin-iron-minerals-folic acid (CENTRUM) chewable tablet Chew 1 tablet by mouth daily.    [provider]  nitroGLYCERIN (NITROSTAT) 0.4 MG SL tablet Place 1 tablet (0.4 mg total) under the tongue every 5 (five) minutes as needed for chest pain. 11/26/22   Charlsie Quest, NP  Omega-3 Fatty Acids (FISH OIL) 1200 MG CAPS Take by mouth.    [provider]  ondansetron (ZOFRAN) 4 MG tablet Take one tablet by mouth once daily, as needed, for nausea 12/09/22   Kerri Perches, MD  oxyCODONE-acetaminophen (PERCOCET/ROXICET) 5-325 MG tablet Take 1 tablet by mouth every 6 (six) hours as needed for severe pain. 06/30/23   Bethann Berkshire, MD  pregabalin (LYRICA) 25 MG capsule Take 1 capsule (25 mg total) by mouth daily. 12/04/22   Kerri Perches, MD  rosuvastatin (CRESTOR) 40 MG tablet TAKE 1 TABLET(40 MG) BY MOUTH DAILY 05/07/23   Kerri Perches, MD                                                                                                                                    Past Surgical History Past Surgical History:  Procedure Laterality Date   ABDOMINAL HYSTERECTOMY     BACK SURGERY     BREAST BIOPSY Left 04/02/2022   BENIGN BREAST  PARENCHYMA WITH FOCAL DYSTROPHIC CALCIFICATION   CARDIAC SURGERY     CARPAL TUNNEL RELEASE Left 07/23/2016   Procedure: CARPAL TUNNEL RELEASE;  Surgeon: Vickki Hearing, MD;  Location: [facility] ORS;  Service: Orthopedics;  Laterality: Left;   CERVICAL LAMINECTOMY     CHOLECYSTECTOMY  COLONOSCOPY  2007   Dr. Elpidio Anis: reported to be normal   COLONOSCOPY WITH PROPOFOL N/A 04/29/2020   Procedure: COLONOSCOPY WITH PROPOFOL;  Surgeon: Corbin Ade, MD;  Location: [facility] ENDO SUITE;  Service: Endoscopy;  Laterality: N/A;  12:45pm   CORONARY ARTERY BYPASS GRAFT N/A 11/02/2013   Procedure: CORONARY ARTERY BYPASS GRAFTING (CABG);  Surgeon: Alleen Borne, MD;  Location: [facility] OR;  Service: Open Heart Surgery;  Laterality: N/A;  CABG x three, using left internal mammary artery and right leg greater saphenous vein harvested endoscopically   COSMETIC SURGERY  07/01/2012   Recurrent skin infection on lower abdomen, [facility]   HERNIA REPAIR     Inguinal herniorrhaphy     INTRAOPERATIVE TRANSESOPHAGEAL ECHOCARDIOGRAM N/A 11/02/2013   Procedure: INTRAOPERATIVE TRANSESOPHAGEAL ECHOCARDIOGRAM;  Surgeon: Alleen Borne, MD;  Location: [facility] OR;  Service: Open Heart Surgery;  Laterality: N/A;   LAPAROSCOPIC GASTRIC BANDING  2009   LEFT HEART CATHETERIZATION WITH CORONARY ANGIOGRAM N/A 10/30/2013   Procedure: LEFT HEART CATHETERIZATION WITH CORONARY ANGIOGRAM;  Surgeon: Rollene Rotunda, MD;  Location: [facility] CATH LAB;  Service: Cardiovascular;  Laterality: N/A;   Left index finger repair     LUMBAR FUSION     POLYPECTOMY  04/29/2020   Procedure: POLYPECTOMY;  Surgeon: Corbin Ade, MD;  Location: [facility] ENDO SUITE;  Service: Endoscopy;;   SPINE SURGERY     VESICOVAGINAL FISTULA CLOSURE W/ TAH     Family History Family History  Problem Relation Age of Onset   Heart attack Mother    Heart attack Father    Hypertension Sister    Heart attack Sister    Cancer Brother        Renal cell    Hepatitis Daughter    Drug  abuse Daughter    Hypertension Daughter    Colon cancer Neg Hx     Social History Social History   Tobacco Use   Smoking status: Every Day    Current packs/day: 0.25    Average packs/day: 0.3 packs/day for 50.0 years (12.5 ttl pk-yrs)    Types: Cigarettes   Smokeless tobacco: Never   Tobacco comments:    smokes about 3 a day   Vaping Use   Vaping status: Never Used  Substance Use Topics   Alcohol use: No    Alcohol/week: 0.0 standard drinks of alcohol   Drug use: No   Allergies Gabapentin, Meperidine hcl, Niacin, Propoxyphene n-acetaminophen, and Hydrocodone-acetaminophen  Review of Systems Review of Systems  Constitutional:  Positive for fatigue. Negative for chills.  HENT:  Positive for congestion.   Respiratory:  Positive for cough and shortness of breath.   Cardiovascular:  Negative for chest pain.  Gastrointestinal:  Negative for abdominal pain and diarrhea.  Genitourinary:  Negative for dysuria and urgency.  Neurological:  Positive for weakness. Negative for light-headedness and headaches.  All other systems reviewed and are negative.   Physical Exam Vital Signs  I have reviewed the triage vital signs BP (!) 148/52   Pulse 75   Temp 97.7 F (36.5 C) (Oral)   Resp 18   Ht 5\' 4"  (1.626 m)   Wt 90.3 kg   SpO2 100%   BMI 34.16 kg/m  Physical Exam Vitals and nursing note reviewed.  Constitutional:      General: She is not in acute distress.    Appearance: Normal appearance.  HENT:     Head: Normocephalic and atraumatic.     Right Ear: External ear normal.  Left Ear: External ear normal.     Nose: Nose normal.     Mouth/Throat:     Mouth: Mucous membranes are moist.  Eyes:     General: No scleral icterus.       Right eye: No discharge.        Left eye: No discharge.     Extraocular Movements: Extraocular movements intact.     Pupils: Pupils are equal, round, and reactive to light.  Cardiovascular:     Rate and Rhythm: Normal rate and regular  rhythm.     Pulses: Normal pulses.     Heart sounds: Normal heart sounds.  Pulmonary:     Effort: Pulmonary effort is normal. No respiratory distress.     Breath sounds: Normal breath sounds. No stridor.  Abdominal:     General: Abdomen is flat. There is no distension.     Palpations: Abdomen is soft.     Tenderness: There is no abdominal tenderness.  Musculoskeletal:     Cervical back: No rigidity.     Right lower leg: No edema.     Left lower leg: No edema.  Skin:    General: Skin is warm and dry.     Capillary Refill: Capillary refill takes less than 2 seconds.  Neurological:     Mental Status: She is alert and oriented to person, place, and time.     GCS: GCS eye subscore is 4. GCS verbal subscore is 5. GCS motor subscore is 6.     Cranial Nerves: Cranial nerves 2-12 are intact. No dysarthria.     Sensory: Sensation is intact.     Motor: Motor function is intact. No tremor.     Coordination: Coordination is intact.  Psychiatric:        Mood and Affect: Mood normal.        Behavior: Behavior normal. Behavior is cooperative.     ED Results and Treatments Labs (all labs ordered are listed, but only abnormal results are displayed) Labs Reviewed  BRAIN NATRIURETIC PEPTIDE - Abnormal; Notable for the following components:      Result Value   B Natriuretic Peptide 601.0 (*)    All other components within normal limits  COMPREHENSIVE METABOLIC PANEL - Abnormal; Notable for the following components:   Sodium 134 (*)    Glucose, Bld 140 (*)    BUN 26 (*)    Creatinine, Ser 1.48 (*)    Calcium 8.8 (*)    Total Protein 6.1 (*)    Albumin 2.5 (*)    GFR, Estimated 36 (*)    All other components within normal limits  CBC WITH DIFFERENTIAL/PLATELET - Abnormal; Notable for the following components:   RBC 2.78 (*)    Hemoglobin 9.3 (*)    HCT 28.8 (*)    MCV 103.6 (*)    Platelets 117 (*)    All other components within normal limits  TROPONIN I (HIGH SENSITIVITY) - Abnormal;  Notable for the following components:   Troponin I (High Sensitivity) 24 (*)    All other components within normal limits  TROPONIN I (HIGH SENSITIVITY) - Abnormal; Notable for the following components:   Troponin I (High Sensitivity) 23 (*)    All other components within normal limits  SARS CORONAVIRUS 2 BY RT PCR  TSH  URINALYSIS, ROUTINE W REFLEX MICROSCOPIC  T4, FREE  Radiology DG Chest Port 1 View  Result Date: 09/04/2023 CLINICAL DATA:  77 year old female with history of cough. EXAM: PORTABLE CHEST 1 VIEW COMPARISON:  Chest x-ray 02/23/2023. FINDINGS: Lung volumes are low. Opacity at the left base new compared to the prior study, which may reflect atelectasis and/or consolidation, with superimposed small left pleural effusion. No right pleural effusion. No pneumothorax. No pulmonary nodule or mass noted. Pulmonary vasculature and the cardiomediastinal silhouette are within normal limits. Atherosclerosis in the thoracic aorta. Status post median sternotomy for CABG. IMPRESSION: 1. New area of atelectasis and/or consolidation in the left lung base with superimposed small left pleural effusion. Followup PA and lateral chest X-ray is recommended in 3-4 weeks following trial of antibiotic therapy to ensure resolution and exclude underlying malignancy. 2. Aortic atherosclerosis. Electronically Signed   By: Trudie Reed M.D.   On: 09/04/2023 14:54    Pertinent labs & imaging results that were available during my care of the patient were reviewed by me and considered in my medical decision making (see MDM for details).  Medications Ordered in ED Medications  azithromycin (ZITHROMAX) 500 mg in sodium chloride 0.9 % 250 mL IVPB (500 mg Intravenous New Bag/Given 09/04/23 1545)  cefTRIAXone (ROCEPHIN) 1 g in sodium chloride 0.9 % 100 mL IVPB (1 g Intravenous New Bag/Given  09/04/23 1536)  aspirin chewable tablet 324 mg (324 mg Oral Given 09/04/23 1539)  furosemide (LASIX) injection 20 mg (20 mg Intravenous Given 09/04/23 1538)                                                                                                                                     Procedures .Critical Care  Performed by: Sloan Leiter, DO Authorized by: Sloan Leiter, DO   Critical care provider statement:    Critical care time (minutes):  55   Critical care time was exclusive of:  Separately billable procedures and treating other patients   Critical care was necessary to treat or prevent imminent or life-threatening deterioration of the following conditions:  Cardiac failure   Critical care was time spent personally by me on the following activities:  Development of treatment plan with patient or surrogate, discussions with consultants, evaluation of patient's response to treatment, examination of patient, ordering and review of laboratory studies, ordering and review of radiographic studies, ordering and performing treatments and interventions, pulse oximetry, re-evaluation of patient's condition, review of old charts and obtaining history from patient or surrogate   Care discussed with: admitting provider     (including critical care time)  Medical Decision Making / ED Course    Medical Decision Making:    Ytzel Singelton is a 77 y.o. female with past medical history as below, significant for atrial fibrillation, CHF, CKD, GERD, HLD, hypothyroid who presents to the ED with complaint of weakness, cough congestion. The complaint involves an extensive differential diagnosis and also carries with it a high  risk of complications and morbidity.  Serious etiology was considered. Ddx includes but is not limited to: In my evaluation of this patient's dyspnea my DDx includes, but is not limited to, pneumonia, pulmonary embolism, pneumothorax, pulmonary edema, metabolic acidosis, asthma,  COPD, cardiac cause, anemia, anxiety, metabolic derangement, thyroid disturbance etc.    Complete initial physical exam performed, notably the patient  was NAD, sitting upright, exam is stable.    Reviewed and confirmed nursing documentation for past medical history, family history, social history.  Vital signs reviewed.    Clinical Course as of 09/04/23 1642  Sat Sep 04, 2023  1516 Hemoglobin(!): 9.3 Mildly reduced from prior, baseline near 11 [SG]  1516 Creatinine(!): 1.48 Mild elev but similar to prior  [SG]  1517 Troponin I (High Sensitivity)(!): 24 [SG]  1517 B Natriuretic Peptide(!): 601.0 No ongoing chest pain [SG]    Clinical Course User Index [SG] Tanda Rockers A, DO     Workup concerning for pneumonia with new pleural effusion.  She has elevated BNP over 600.  No significant chest pain or dyspnea.  Troponin is mildly elevated.  Start Lasix.  Does not appear overtly volume overloaded on exam, no hypoxia.  Concern new onset heart failure.  Will treat for pneumonia, not septic.  Recommend admission.  She is agreeable                 Additional history obtained: -Additional history obtained from na -External records from outside source obtained and reviewed including: Chart review including previous notes, labs, imaging, consultation notes including  Prior ED visit 2 months ago, primary care recommendation, home medications Follows with Dr. Diona Browner cardiology   Lab Tests: -I ordered, reviewed, and interpreted labs.   The pertinent results include:   Labs Reviewed  BRAIN NATRIURETIC PEPTIDE - Abnormal; Notable for the following components:      Result Value   B Natriuretic Peptide 601.0 (*)    All other components within normal limits  COMPREHENSIVE METABOLIC PANEL - Abnormal; Notable for the following components:   Sodium 134 (*)    Glucose, Bld 140 (*)    BUN 26 (*)    Creatinine, Ser 1.48 (*)    Calcium 8.8 (*)    Total Protein 6.1 (*)    Albumin  2.5 (*)    GFR, Estimated 36 (*)    All other components within normal limits  CBC WITH DIFFERENTIAL/PLATELET - Abnormal; Notable for the following components:   RBC 2.78 (*)    Hemoglobin 9.3 (*)    HCT 28.8 (*)    MCV 103.6 (*)    Platelets 117 (*)    All other components within normal limits  TROPONIN I (HIGH SENSITIVITY) - Abnormal; Notable for the following components:   Troponin I (High Sensitivity) 24 (*)    All other components within normal limits  TROPONIN I (HIGH SENSITIVITY) - Abnormal; Notable for the following components:   Troponin I (High Sensitivity) 23 (*)    All other components within normal limits  SARS CORONAVIRUS 2 BY RT PCR  TSH  URINALYSIS, ROUTINE W REFLEX MICROSCOPIC  T4, FREE    Notable for as above  EKG   EKG Interpretation Date/Time:    Ventricular Rate:    PR Interval:    QRS Duration:    QT Interval:    QTC Calculation:   R Axis:      Text Interpretation:           Imaging Studies ordered: I  ordered imaging studies including cxr I independently visualized the following imaging with scope of interpretation limited to determining acute life threatening conditions related to emergency care; findings noted above I independently visualized and interpreted imaging. I agree with the radiologist interpretation   Medicines ordered and prescription drug management: Meds ordered this encounter  Medications   cefTRIAXone (ROCEPHIN) 1 g in sodium chloride 0.9 % 100 mL IVPB    Order Specific Question:   Antibiotic Indication:    Answer:   CAP   azithromycin (ZITHROMAX) 500 mg in sodium chloride 0.9 % 250 mL IVPB    Order Specific Question:   Antibiotic Indication:    Answer:   CAP   aspirin chewable tablet 324 mg   furosemide (LASIX) injection 20 mg    -I have reviewed the patients home medicines and have made adjustments as needed   Consultations Obtained: na   Cardiac Monitoring: The patient was maintained on a cardiac monitor.  I  personally viewed and interpreted the cardiac monitored which showed an underlying rhythm of: NSR  Social Determinants of Health:  Diagnosis or treatment significantly limited by social determinants of health: current smoker   Reevaluation: After the interventions noted above, I reevaluated the patient and found that they have improved  Co morbidities that complicate the patient evaluation  Past Medical History:  Diagnosis Date   Arthritis    Atrial fibrillation (HCC)    Carpal tunnel syndrome    CHF (congestive heart failure) (HCC)    Chronic back pain    CKD (chronic kidney disease) stage 3, GFR 30-59 ml/min (HCC)    Coronary atherosclerosis of native coronary artery    Multivessel status post CABG - LIMA to LAD, SVG to OM and SVG to PDA   Depression    Economic stress    Essential hypertension    GERD (gastroesophageal reflux disease)    Headache    History of conjunctivitis    History of hiatal hernia    Hyperlipidemia    Hypothyroidism    Myocardial infarction (HCC) 1999   Obesity    Type 2 diabetes mellitus (HCC)    Uterine cancer (HCC)    Vitamin B 12 deficiency       Dispostion: Disposition decision including need for hospitalization was considered, and patient admitted to the hospital.    Final Clinical Impression(s) / ED Diagnoses Final diagnoses:  Community acquired pneumonia, unspecified laterality  Generalized weakness        Sloan Leiter, DO 09/04/23 1642

## 2023-09-05 DIAGNOSIS — D539 Nutritional anemia, unspecified: Secondary | ICD-10-CM

## 2023-09-05 DIAGNOSIS — R531 Weakness: Secondary | ICD-10-CM

## 2023-09-05 DIAGNOSIS — I10 Essential (primary) hypertension: Secondary | ICD-10-CM

## 2023-09-05 DIAGNOSIS — J189 Pneumonia, unspecified organism: Secondary | ICD-10-CM | POA: Diagnosis not present

## 2023-09-05 LAB — VITAMIN B12: Vitamin B-12: 588 pg/mL (ref 180–914)

## 2023-09-05 LAB — IRON AND TIBC
Iron: 65 ug/dL (ref 28–170)
Saturation Ratios: 22 % (ref 10.4–31.8)
TIBC: 297 ug/dL (ref 250–450)
UIBC: 232 ug/dL

## 2023-09-05 LAB — BASIC METABOLIC PANEL
Anion gap: 8 (ref 5–15)
BUN: 28 mg/dL — ABNORMAL HIGH (ref 8–23)
CO2: 27 mmol/L (ref 22–32)
Calcium: 8.8 mg/dL — ABNORMAL LOW (ref 8.9–10.3)
Chloride: 102 mmol/L (ref 98–111)
Creatinine, Ser: 1.6 mg/dL — ABNORMAL HIGH (ref 0.44–1.00)
GFR, Estimated: 33 mL/min — ABNORMAL LOW (ref >60)
Glucose, Bld: 109 mg/dL — ABNORMAL HIGH (ref 70–99)
Potassium: 3.7 mmol/L (ref 3.5–5.1)
Sodium: 137 mmol/L (ref 135–145)

## 2023-09-05 LAB — CBC
HCT: 26.5 % — ABNORMAL LOW (ref 36.0–46.0)
Hemoglobin: 8.6 g/dL — ABNORMAL LOW (ref 12.0–15.0)
MCH: 33.6 pg (ref 26.0–34.0)
MCHC: 32.5 g/dL (ref 30.0–36.0)
MCV: 103.5 fL — ABNORMAL HIGH (ref 80.0–100.0)
Platelets: 112 10*3/uL — ABNORMAL LOW (ref 150–400)
RBC: 2.56 MIL/uL — ABNORMAL LOW (ref 3.87–5.11)
RDW: 14.9 % (ref 11.5–15.5)
WBC: 5.1 10*3/uL (ref 4.0–10.5)
nRBC: 0 % (ref 0.0–0.2)

## 2023-09-05 LAB — RETICULOCYTES
Immature Retic Fract: 26.8 % — ABNORMAL HIGH (ref 2.3–15.9)
RBC.: 2.74 MIL/uL — ABNORMAL LOW (ref 3.87–5.11)
Retic Count, Absolute: 103.3 10*3/uL (ref 19.0–186.0)
Retic Ct Pct: 3.8 % — ABNORMAL HIGH (ref 0.4–3.1)

## 2023-09-05 LAB — HIV ANTIBODY (ROUTINE TESTING W REFLEX): HIV Screen 4th Generation wRfx: NONREACTIVE

## 2023-09-05 LAB — STREP PNEUMONIAE URINARY ANTIGEN: Strep Pneumo Urinary Antigen: NEGATIVE

## 2023-09-05 LAB — FOLATE: Folate: 11 ng/mL (ref >5.9)

## 2023-09-05 LAB — MAGNESIUM: Magnesium: 1.4 mg/dL — ABNORMAL LOW (ref 1.7–2.4)

## 2023-09-05 LAB — FERRITIN: Ferritin: 79 ng/mL (ref 11–307)

## 2023-09-05 MED ORDER — FAMOTIDINE 20 MG PO TABS
20.0000 mg | ORAL_TABLET | Freq: Every day | ORAL | Status: DC
  Administered 2023-09-05 – 2023-09-16 (×11): 20 mg via ORAL
  Filled 2023-09-05 (×12): qty 1

## 2023-09-05 MED ORDER — NITROGLYCERIN 0.4 MG SL SUBL
0.4000 mg | SUBLINGUAL_TABLET | SUBLINGUAL | Status: DC | PRN
  Administered 2023-09-07: 0.4 mg via SUBLINGUAL
  Filled 2023-09-05: qty 1

## 2023-09-05 MED ORDER — FENOFIBRATE 160 MG PO TABS
160.0000 mg | ORAL_TABLET | Freq: Every day | ORAL | Status: DC
  Administered 2023-09-05 – 2023-09-11 (×6): 160 mg via ORAL
  Filled 2023-09-05 (×7): qty 1

## 2023-09-05 MED ORDER — DULOXETINE HCL 60 MG PO CPEP
60.0000 mg | ORAL_CAPSULE | Freq: Two times a day (BID) | ORAL | Status: DC
  Administered 2023-09-05 – 2023-09-11 (×10): 60 mg via ORAL
  Filled 2023-09-05 (×11): qty 1

## 2023-09-05 MED ORDER — ROSUVASTATIN CALCIUM 20 MG PO TABS
40.0000 mg | ORAL_TABLET | Freq: Every day | ORAL | Status: DC
  Administered 2023-09-05 – 2023-09-11 (×6): 40 mg via ORAL
  Filled 2023-09-05 (×8): qty 2

## 2023-09-05 MED ORDER — HALOPERIDOL LACTATE 5 MG/ML IJ SOLN
2.0000 mg | Freq: Four times a day (QID) | INTRAMUSCULAR | Status: DC | PRN
  Administered 2023-09-05 – 2023-09-07 (×4): 2 mg via INTRAVENOUS
  Filled 2023-09-05 (×4): qty 1

## 2023-09-05 MED ORDER — HYDRALAZINE HCL 25 MG PO TABS
25.0000 mg | ORAL_TABLET | Freq: Three times a day (TID) | ORAL | Status: DC | PRN
  Administered 2023-09-05: 25 mg via ORAL
  Filled 2023-09-05: qty 1

## 2023-09-05 MED ORDER — HYDROXYZINE HCL 25 MG PO TABS
25.0000 mg | ORAL_TABLET | Freq: Three times a day (TID) | ORAL | Status: DC | PRN
  Administered 2023-09-05 – 2023-09-07 (×5): 25 mg via ORAL
  Filled 2023-09-05 (×5): qty 1

## 2023-09-05 NOTE — Progress Notes (Signed)
Called by RN to come administer a nebulizer. Patient is very tearful and scared at this time. She stated we were trying to kill her. I assured her we were not and asked the questions for orientation. She was aware of where we are but not of the year. We attempted to call her next of kin and left a voicemail. Patient is clear and diminished upon auscultation. No further intervention needed from me at this time.

## 2023-09-05 NOTE — Plan of Care (Signed)
  Problem: Health Behavior/Discharge Planning: Goal: Ability to manage health-related needs will improve Outcome: Progressing   Problem: Clinical Measurements: Goal: Ability to maintain clinical measurements within normal limits will improve Outcome: Progressing   Problem: Pain Managment: Goal: General experience of comfort will improve Outcome: Progressing   Problem: Safety: Goal: Ability to remain free from injury will improve Outcome: Progressing   Problem: Skin Integrity: Goal: Risk for impaired skin integrity will decrease Outcome: Progressing

## 2023-09-05 NOTE — Plan of Care (Signed)

## 2023-09-05 NOTE — Progress Notes (Signed)
PT Cancellation Note  Patient Details Name: Tammy Suarez MRN: [id number] DOB: 1946/04/05   Cancelled Treatment:     PT refused  Virgina Organ, PT CLT [phone]  09/05/2023, 10:00 AM

## 2023-09-05 NOTE — TOC CM/SW Note (Signed)
Transition of Care [facility]) - Inpatient Brief Assessment   Patient Details  Name: Tammy Suarez MRN: [id number] Date of Birth: [birth year]  Transition of Care [facility]) CM/SW Contact:    Villa Herb, LCSWA Phone Number: 09/05/2023, 11:40 AM   Clinical Narrative: Transition of Care Department [facility]) has reviewed patient and no TOC needs have been identified at this time. We will continue to monitor patient advancement through interdisciplinary progression rounds. If new patient transition needs arise, please place a TOC consult.  Transition of Care Asessment: Insurance and Status: Insurance coverage has been reviewed Patient has primary care physician: Yes Home environment has been reviewed: from home Prior level of function:: independent Prior/Current Home Services: No current home services Social Determinants of Health Reivew: SDOH reviewed no interventions necessary Readmission risk has been reviewed: Yes Transition of care needs: no transition of care needs at this time

## 2023-09-05 NOTE — Progress Notes (Signed)
[facility]                                                                               Dwayne Lierman, is a 77 y.o. female, DOB - June 18, 1946, [id number] Admit date - 09/04/2023    Outpatient Primary MD for the patient is Kerri Perches, MD  LOS - 1  days    Brief summary   Tammy Suarez is a 77 y.o. female with medical history significant of A-fib, CHF, CKD, GERD, HLD, and hypothyroidism who presented to the ED for evaluation of generalized weakness and cough.  Per patient and family patient has not felt well for over 3 weeks.    Significant labs includes creatinine 1.48, negative troponins, negative COVID test, negative UA, BNP of 601 and normal TSH. CXR shows evidence of left lower lung pneumonia superimposed on small left pleural effusion. Patient started on ceftriaxone and azithromycin and hospitalist consulted to admit for further manage treatment of community-acquired pneumonia    Assessment & Plan    Assessment and Plan:  CAP: Started her on IV rocephin and IV zithromax, continue with mucinex.  Pt on RA.  Duonebs as needed.  She reports being congested and coughing.    Atrial fibrillation:  Rate controlled with metoprolol .    Hypertension:  Not well controlled. Restarted her on home meds.  Pt on isosorbide mononitrate 30 mg daily, losartan 100 mg daily and metoprolol.  Added hydralazine prn.    Anxiety.  Pt started on hydroxyzine and haldol added on.     Hyperlipidemia Resume crestor.   Mild AKI Suspect from dehydration and poor oral intake.  Recheck labs in am an dif creatinine does not improve, please start the patient on IV fluids.  Baseline creatinine around 1.1 .    Macrocytic anemia:  Hemoglobin around 8.  Baseline appears to be around 11.  ? Hemodilution.  Recheck labs in am.  Check anemia panel.    RN Pressure Injury Documentation: Pressure Injury 09/04/23 Buttocks Right Stage 2 -  Partial thickness loss of dermis  presenting as a shallow open injury with a red, pink wound bed without slough. .10cm x .02 cm to buttocks (Active)  09/04/23 2229  Location: Buttocks  Location Orientation: Right  Staging: Stage 2 -  Partial thickness loss of dermis presenting as a shallow open injury with a red, pink wound bed without slough.  Wound Description (Comments): .10cm x .02 cm to buttocks  Present on Admission:        Estimated body mass index is 34.16 kg/m as calculated from the following:   Height as of this encounter: 5\' 4"  (1.626 m).   Weight as of this encounter: 90.3 kg.  Code Status: DNR comfort  DVT Prophylaxis:  enoxaparin (LOVENOX) injection 40 mg Start: 09/04/23 2200   Level of Care: Level of care: Med-Surg Family Communication: none at bedside.   Disposition Plan:     Remains inpatient appropriate:  IV antibiotics.   Procedures:  None.   Consultants:   None.   Antimicrobials:   Anti-infectives (From admission, onward)    Start     Dose/Rate Route Frequency Ordered Stop   09/05/23  1500  cefTRIAXone (ROCEPHIN) 2 g in sodium chloride 0.9 % 100 mL IVPB        2 g 200 mL/hr over 30 Minutes Intravenous Every 24 hours 09/04/23 1742 09/09/23 1459   09/05/23 1500  azithromycin (ZITHROMAX) tablet 500 mg        500 mg Oral Daily 09/04/23 1742 09/09/23 0959   09/04/23 1515  cefTRIAXone (ROCEPHIN) 1 g in sodium chloride 0.9 % 100 mL IVPB        1 g 200 mL/hr over 30 Minutes Intravenous  Once 09/04/23 1507 09/04/23 1742   09/04/23 1515  azithromycin (ZITHROMAX) 500 mg in sodium chloride 0.9 % 250 mL IVPB        500 mg 250 mL/hr over 60 Minutes Intravenous  Once 09/04/23 1507 09/04/23 1743        Medications  Scheduled Meds:  azithromycin  500 mg Oral Daily   DULoxetine  60 mg Oral BID   enoxaparin (LOVENOX) injection  40 mg Subcutaneous Q24H   famotidine  20 mg Oral Daily   fenofibrate  160 mg Oral Daily   guaiFENesin  600 mg Oral BID   isosorbide mononitrate  30 mg Oral QHS    levothyroxine  125 mcg Oral Q0600   losartan  100 mg Oral Daily   metoprolol succinate  25 mg Oral QHS   metoprolol succinate  50 mg Oral Daily   rosuvastatin  40 mg Oral Daily   Continuous Infusions:  cefTRIAXone (ROCEPHIN)  IV 2 g (09/05/23 1603)   PRN Meds:.albuterol, haloperidol lactate, hydrOXYzine, nitroGLYCERIN, ondansetron (ZOFRAN) IV **OR** ondansetron    Subjective:   Tammy Suarez was seen and examined today.  Anxious, crying, wants to get out of bed.   Objective:   Vitals:   09/05/23 0101 09/05/23 0509 09/05/23 1200 09/05/23 1528  BP: (!) 156/56 (!) 154/65  (!) 169/73  Pulse: 71 75  71  Resp: 18 17  18   Temp: 98.1 F (36.7 C) 98.1 F (36.7 C)  98 F (36.7 C)  TempSrc: Oral Oral  Oral  SpO2: 100% 96% 97% 95%  Weight:      Height:        Intake/Output Summary (Last 24 hours) at 09/05/2023 1702 Last data filed at 09/05/2023 1432 Gross per 24 hour  Intake 475 ml  Output 300 ml  Net 175 ml   Filed Weights   09/04/23 1232  Weight: 90.3 kg     Exam General exam: Appears calm and comfortable  Respiratory system: tachypnea, scattered rhonchi.  Cardiovascular system: S1 & S2 heard, RRR.  Gastrointestinal system: Abdomen is nondistended, soft and nontender.  Central nervous system: Alert and oriented.  Extremities: Symmetric 5 x 5 power. Skin: No rashes,  Psychiatry: anxious   Data Reviewed:  I have personally reviewed following labs and imaging studies   CBC Lab Results  Component Value Date   WBC 5.1 09/05/2023   RBC 2.56 (L) 09/05/2023   HGB 8.6 (L) 09/05/2023   HCT 26.5 (L) 09/05/2023   MCV 103.5 (H) 09/05/2023   MCH 33.6 09/05/2023   PLT 112 (L) 09/05/2023   MCHC 32.5 09/05/2023   RDW 14.9 09/05/2023   LYMPHSABS 1.5 09/04/2023   MONOABS 0.8 09/04/2023   EOSABS 0.2 09/04/2023   BASOSABS 0.0 09/04/2023     Last metabolic panel Lab Results  Component Value Date   NA 137 09/05/2023   K 3.7 09/05/2023   CL 102 09/05/2023   CO2 27  09/05/2023  BUN 28 (H) 09/05/2023   CREATININE 1.60 (H) 09/05/2023   GLUCOSE 109 (H) 09/05/2023   GFRNONAA 33 (L) 09/05/2023   GFRAA 35 (L) 10/04/2020   CALCIUM 8.8 (L) 09/05/2023   PROT 6.1 (L) 09/04/2023   ALBUMIN 2.5 (L) 09/04/2023   LABGLOB 2.8 05/05/2023   AGRATIO 1.3 05/27/2022   BILITOT 1.0 09/04/2023   ALKPHOS 84 09/04/2023   AST 24 09/04/2023   ALT 11 09/04/2023   ANIONGAP 8 09/05/2023    CBG (last 3)  No results for input(s): "GLUCAP" in the last 72 hours.    Coagulation Profile: No results for input(s): "INR", "PROTIME" in the last 168 hours.   Radiology Studies: DG Chest Port 1 View  Result Date: 09/04/2023 CLINICAL DATA:  77 year old female with history of cough. EXAM: PORTABLE CHEST 1 VIEW COMPARISON:  Chest x-ray 02/23/2023. FINDINGS: Lung volumes are low. Opacity at the left base new compared to the prior study, which may reflect atelectasis and/or consolidation, with superimposed small left pleural effusion. No right pleural effusion. No pneumothorax. No pulmonary nodule or mass noted. Pulmonary vasculature and the cardiomediastinal silhouette are within normal limits. Atherosclerosis in the thoracic aorta. Status post median sternotomy for CABG. IMPRESSION: 1. New area of atelectasis and/or consolidation in the left lung base with superimposed small left pleural effusion. Followup PA and lateral chest X-ray is recommended in 3-4 weeks following trial of antibiotic therapy to ensure resolution and exclude underlying malignancy. 2. Aortic atherosclerosis. Electronically Signed   By: Trudie Reed M.D.   On: 09/04/2023 14:54       Kathlen Mody M.D. [facility] 09/05/2023, 5:02 PM  Available via Epic secure chat 7am-7pm After 7 pm, please refer to night coverage provider listed on amion.

## 2023-09-05 NOTE — Progress Notes (Signed)
pt is panicking, restless, trying to get out of the bed and pulling everything off. She stated she is closterphobic and she wants to get out of here. Notified MD. No prn orders at this time.

## 2023-09-05 NOTE — Progress Notes (Signed)
Pt sat in the chair for 2 hours. Staff assisted her back to bed.

## 2023-09-05 NOTE — Progress Notes (Addendum)
MD at beside. Verbal order to have RT to come give her a neb tx and get her in the chair.

## 2023-09-06 DIAGNOSIS — R531 Weakness: Secondary | ICD-10-CM | POA: Diagnosis not present

## 2023-09-06 DIAGNOSIS — I10 Essential (primary) hypertension: Secondary | ICD-10-CM | POA: Diagnosis not present

## 2023-09-06 DIAGNOSIS — J189 Pneumonia, unspecified organism: Secondary | ICD-10-CM | POA: Diagnosis not present

## 2023-09-06 DIAGNOSIS — D539 Nutritional anemia, unspecified: Secondary | ICD-10-CM | POA: Diagnosis not present

## 2023-09-06 LAB — CBC WITH DIFFERENTIAL/PLATELET
Abs Immature Granulocytes: 0.02 10*3/uL (ref 0.00–0.07)
Basophils Absolute: 0 10*3/uL (ref 0.0–0.1)
Basophils Relative: 1 %
Eosinophils Absolute: 0.2 10*3/uL (ref 0.0–0.5)
Eosinophils Relative: 3 %
HCT: 28 % — ABNORMAL LOW (ref 36.0–46.0)
Hemoglobin: 8.7 g/dL — ABNORMAL LOW (ref 12.0–15.0)
Immature Granulocytes: 0 %
Lymphocytes Relative: 38 %
Lymphs Abs: 2.1 10*3/uL (ref 0.7–4.0)
MCH: 32.5 pg (ref 26.0–34.0)
MCHC: 31.1 g/dL (ref 30.0–36.0)
MCV: 104.5 fL — ABNORMAL HIGH (ref 80.0–100.0)
Monocytes Absolute: 0.7 10*3/uL (ref 0.1–1.0)
Monocytes Relative: 14 %
Neutro Abs: 2.4 10*3/uL (ref 1.7–7.7)
Neutrophils Relative %: 44 %
Platelets: 115 10*3/uL — ABNORMAL LOW (ref 150–400)
RBC: 2.68 MIL/uL — ABNORMAL LOW (ref 3.87–5.11)
RDW: 15 % (ref 11.5–15.5)
WBC: 5.4 10*3/uL (ref 4.0–10.5)
nRBC: 0 % (ref 0.0–0.2)

## 2023-09-06 LAB — BASIC METABOLIC PANEL
Anion gap: 8 (ref 5–15)
BUN: 25 mg/dL — ABNORMAL HIGH (ref 8–23)
CO2: 25 mmol/L (ref 22–32)
Calcium: 8.7 mg/dL — ABNORMAL LOW (ref 8.9–10.3)
Chloride: 103 mmol/L (ref 98–111)
Creatinine, Ser: 1.39 mg/dL — ABNORMAL HIGH (ref 0.44–1.00)
GFR, Estimated: 39 mL/min — ABNORMAL LOW (ref >60)
Glucose, Bld: 137 mg/dL — ABNORMAL HIGH (ref 70–99)
Potassium: 3.6 mmol/L (ref 3.5–5.1)
Sodium: 136 mmol/L (ref 135–145)

## 2023-09-06 LAB — MAGNESIUM: Magnesium: 1.4 mg/dL — ABNORMAL LOW (ref 1.7–2.4)

## 2023-09-06 MED ORDER — SENNOSIDES-DOCUSATE SODIUM 8.6-50 MG PO TABS: 2.0000 | ORAL_TABLET | Freq: Every evening | ORAL | Status: DC | PRN

## 2023-09-06 MED ORDER — MAGNESIUM SULFATE 2 GM/50ML IV SOLN
2.0000 g | Freq: Once | INTRAVENOUS | Status: AC
  Administered 2023-09-06: 2 g via INTRAVENOUS
  Filled 2023-09-06: qty 50

## 2023-09-06 NOTE — Evaluation (Signed)
Physical Therapy Evaluation Patient Details Name: Tammy Suarez MRN: [id number] DOB: Dec 21, 1945 Today's Date: 09/06/2023  History of Present Illness  Tammy Suarez is a 77 y.o. female with medical history significant of A-fib, CHF, CKD, GERD, HLD, and hypothyroidism who presented to the ED for evaluation of generalized weakness and cough.  Per patient and family patient has not felt well for over 3 weeks.  Over the last 4 to 5 days, she had developed cough and cold symptoms.  She has had a productive cough with yellowish/greenish sputum.  Per family, patient has slid down her bed multiple times due to generalized weakness.  She reports associated nasal congestion and occasional shortness of breath but denies any fevers, chills, chest pain, abdominal pain or headaches.   Clinical Impression  Patient demonstrates good return for getting into/out of bed with slightly labored movement, slow labored cadence during gait training requiring increased time for making turns using RW and limited mostly due to c/o fatigue and generalized weakness.  Patient tolerated staying up in chair after therapy.  Patient will benefit from continued skilled physical therapy in hospital and recommended venue below to increase strength, balance, endurance for safe ADLs and gait.          If plan is discharge home, recommend the following: A little help with walking and/or transfers;A little help with bathing/dressing/bathroom;Assistance with cooking/housework;Help with stairs or ramp for entrance   Can travel by private vehicle        Equipment Recommendations None recommended by PT  Recommendations for Other Services       Functional Status Assessment Patient has had a recent decline in their functional status and demonstrates the ability to make significant improvements in function in a reasonable and predictable amount of time.     Precautions / Restrictions Precautions Precautions: Fall Restrictions Weight  Bearing Restrictions: No      Mobility  Bed Mobility Overal bed mobility: Needs Assistance Bed Mobility: Supine to Sit, Sit to Supine     Supine to sit: Supervision, Modified independent (Device/Increase time) Sit to supine: Supervision, Modified independent (Device/Increase time)   General bed mobility comments: slightly labored movement    Transfers Overall transfer level: Needs assistance Equipment used: Rolling walker (2 wheels) Transfers: Sit to/from Stand, Bed to chair/wheelchair/BSC Sit to Stand: Contact guard assist   Step pivot transfers: Contact guard assist       General transfer comment: slow labored movement    Ambulation/Gait Ambulation/Gait assistance: Contact guard assist, Min assist Gait Distance (Feet): 30 Feet Assistive device: Rolling walker (2 wheels) Gait Pattern/deviations: Decreased step length - right, Decreased step length - left, Decreased stride length Gait velocity: decreased     General Gait Details: slow labored unsteady cadence without loss of balance and limited mostly due to c/o fatigue and generalized weakness  Stairs            Wheelchair Mobility     Tilt Bed    Modified Rankin (Stroke Patients Only)       Balance Overall balance assessment: Needs assistance Sitting-balance support: Feet supported, No upper extremity supported Sitting balance-Leahy Scale: Fair Sitting balance - Comments: fair/good seated at EOB   Standing balance support: Reliant on assistive device for balance, During functional activity, Bilateral upper extremity supported Standing balance-Leahy Scale: Fair Standing balance comment: using RW                             Pertinent Vitals/Pain  Pain Assessment Pain Assessment: No/denies pain    Home Living Family/patient expects to be discharged to:: Private residence Living Arrangements: Children Available Help at Discharge: Family;Available PRN/intermittently Type of Home:  House Home Access: Ramped entrance       Home Layout: One level Home Equipment: Agricultural consultant (2 wheels);Shower seat;Grab bars - tub/shower;BSC/3in1      Prior Function Prior Level of Function : Needs assist       Physical Assist : Mobility (physical) Mobility (physical): Bed mobility;Transfers;Gait;Stairs   Mobility Comments: household ambulator using RW ADLs Comments: Assisted by family     Extremity/Trunk Assessment   Upper Extremity Assessment Upper Extremity Assessment: Generalized weakness    Lower Extremity Assessment Lower Extremity Assessment: Generalized weakness    Cervical / Trunk Assessment Cervical / Trunk Assessment: Normal  Communication   Communication Communication: No apparent difficulties  Cognition Arousal: Alert Behavior During Therapy: WFL for tasks assessed/performed Overall Cognitive Status: Within Functional Limits for tasks assessed                                          General Comments      Exercises     Assessment/Plan    PT Assessment Patient needs continued PT services  PT Problem List Decreased strength;Decreased activity tolerance;Decreased balance;Decreased mobility       PT Treatment Interventions DME instruction;Gait training;Stair training;Functional mobility training;Therapeutic activities;Therapeutic exercise;Balance training;Patient/family education    PT Goals (Current goals can be found in the Care Plan section)  Acute Rehab PT Goals Patient Stated Goal: return home with family to assist PT Goal Formulation: With patient Time For Goal Achievement: 09/10/23 Potential to Achieve Goals: Good    Frequency Min 3X/week     Co-evaluation               AM-PAC PT "6 Clicks" Mobility  Outcome Measure Help needed turning from your back to your side while in a flat bed without using bedrails?: None Help needed moving from lying on your back to sitting on the side of a flat bed without  using bedrails?: A Little Help needed moving to and from a bed to a chair (including a wheelchair)?: A Little Help needed standing up from a chair using your arms (e.g., wheelchair or bedside chair)?: A Little Help needed to walk in hospital room?: A Little Help needed climbing 3-5 steps with a railing? : A Lot 6 Click Score: 18    End of Session   Activity Tolerance: Patient tolerated treatment well;Patient limited by fatigue Patient left: in chair;with call bell/phone within reach Nurse Communication: Mobility status PT Visit Diagnosis: Unsteadiness on feet (R26.81);Other abnormalities of gait and mobility (R26.89);Muscle weakness (generalized) (M62.81)    Time: 3474-2595 PT Time Calculation (min) (ACUTE ONLY): 16 min   Charges:   PT Evaluation $PT Eval Low Complexity: 1 Low PT Treatments $Therapeutic Activity: 8-22 mins PT General Charges $$ ACUTE PT VISIT: 1 Visit         1:55 PM, 09/06/23 Ocie Bob, MPT Physical Therapist with [facility] [phone] office [phone] mobile phone

## 2023-09-06 NOTE — Plan of Care (Signed)

## 2023-09-06 NOTE — TOC Initial Note (Addendum)
Transition of Care [facility]) - Initial/Assessment Note    Patient Details  Name: Tammy Suarez MRN: [id number] Date of Birth: 08-21-1946  [facility] CM/SW Contact:    Leitha Bleak, RN Phone Number: 09/06/2023, 1:17 PM  Clinical Narrative:      PT recommended HHPT. Patient is agreeable. Patient lives in [place]. TOC sent out for accepting agency. Discharge planning for tomorrow. Daughter was not ready for her to come home today.         [facility] does not service that area.       ADDENDUM:  [facility] is accepting, MD ordered HHPT>  Expected Discharge Plan: Home w Home Health Services Barriers to Discharge: Continued Medical Work up   Patient Goals and CMS Choice Patient states their goals for this hospitalization and ongoing recovery are:: agreeable to home health CMS Medicare.gov Compare Post Acute Care list provided to:: Patient Choice offered to / list presented to : Patient     Expected Discharge Plan and Services     Post Acute Care Choice: Home Health          Prior Living Arrangements/Services     Patient language and need for interpreter reviewed:: Yes        Need for Family Participation in Patient Care: Yes (Comment) Care giver support system in place?: Yes (comment)   Criminal Activity/Legal Involvement Pertinent to Current Situation/Hospitalization: No - Comment as needed  Activities of Daily Living   ADL Screening (condition at time of admission) Independently performs ADLs?: No Does the patient have a NEW difficulty with bathing/dressing/toileting/self-feeding that is expected to last >3 days?: Yes (Initiates electronic notice to provider for possible OT consult) Does the patient have a NEW difficulty with getting in/out of bed, walking, or climbing stairs that is expected to last >3 days?: Yes (Initiates electronic notice to provider for possible PT consult) Does the patient have a NEW difficulty with communication that is  expected to last >3 days?: No Is the patient deaf or have difficulty hearing?: No Does the patient have difficulty seeing, even when wearing glasses/contacts?: Yes Does the patient have difficulty concentrating, remembering, or making decisions?: No  Permission Sought/Granted      Emotional Assessment     Affect (typically observed): Accepting Orientation: : Oriented to Self, Oriented to Place, Oriented to Situation Alcohol / Substance Use: Not Applicable Psych Involvement: No (comment)  Admission diagnosis:  Community acquired pneumonia [J18.9] Generalized weakness [R53.1] Community acquired pneumonia, unspecified laterality [J18.9] Patient Active Problem List   Diagnosis Date Noted   Community acquired pneumonia 09/04/2023   Depression, major, single episode, severe (HCC) 05/11/2023   Dermatitis 05/11/2023   Requires assistance with activities of daily living (ADL) 05/11/2023   Dysuria 40/98/1191   Chronic pain 12/07/2022   Vitamin D deficiency 11/22/2022   Osteoarthritis 01/29/2021   Right shoulder pain 09/18/2020   Positive colorectal cancer screening using Cologuard test 02/12/2020   Constipation 02/12/2020   Closed fracture of proximal end of right humerus 04/29/19 05/15/2019   Fracture of right shoulder 05/02/2019   Carpal tunnel syndrome, left    Benign mole 10/02/2015   Left carpal tunnel syndrome 12/04/2014   Coronary atherosclerosis of native coronary artery 11/02/2013   CKD (chronic kidney disease) stage 3, GFR 30-59 ml/min (HCC)    DM (diabetes mellitus), type 2 with renal complications (HCC) 09/15/2013   Recurrent falls 07/28/2013   Urinary incontinence 03/23/2013   Nicotine dependence 11/24/2012   Hypothyroidism 09/28/2010  Depression with anxiety 09/28/2010   FATIGUE 02/08/2009   VITAMIN B12 DEFICIENCY 12/09/2007   Hyperlipidemia with target LDL less than 70 12/09/2007   Obesity (BMI 30.0-34.9) 12/09/2007   Essential hypertension, benign 12/09/2007    GERD 12/09/2007   Spinal stenosis of lumbar region with radiculopathy 12/09/2007   PCP:  Kerri Perches, MD Pharmacy:   [facility] [phone] [place], [place] - [address] AT [facility] [address] [place] [address] Phone: [phone] Fax: [phone]  [id number] [place], [place] - [address] [place] [address] Phone: [phone] Fax: [phone]   Social Determinants of Health (SDOH) Social History: SDOH Screenings   Food Insecurity: No Food Insecurity (09/04/2023)  Housing: Low Risk  (09/04/2023)  Transportation Needs: No Transportation Needs (09/04/2023)  Utilities: Not At Risk (09/04/2023)  Alcohol Screen: Low Risk  (04/22/2023)  Depression (PHQ2-9): High Risk (05/05/2023)  Financial Resource Strain: Low Risk  (04/22/2023)  Recent Concern: Financial Resource Strain - Medium Risk (02/24/2023)   [facility]  Physical Activity: Inactive (04/22/2023)  Social Connections: Socially Isolated (04/22/2023)  Stress: No Stress Concern Present (04/22/2023)  Tobacco Use: High Risk (09/04/2023)   SDOH Interventions:   Readmission Risk Interventions    09/06/2023    1:16 PM  Readmission Risk Prevention Plan  Transportation Screening Complete  PCP or Specialist Appt within 5-7 Days Not Complete  Home Care Screening Complete  Medication Review (RN CM) Complete

## 2023-09-06 NOTE — Progress Notes (Signed)
[facility]                                                                               Tammy Suarez, is a 77 y.o. female, DOB - 06-17-1946, GMW:[id number] Admit date - 09/04/2023    Outpatient Primary MD for the patient is Kerri Perches, MD  LOS - 2  days    Brief summary   Tammy Suarez is a 77 y.o. female with medical history significant of A-fib, CHF, CKD, GERD, HLD, and hypothyroidism who presented to the ED for evaluation of generalized weakness and cough.  Per patient and family patient has not felt well for over 3 weeks.    Significant labs includes creatinine 1.48, negative troponins, negative COVID test, negative UA, BNP of 601 and normal TSH. CXR shows evidence of left lower lung pneumonia superimposed on small left pleural effusion. Patient started on ceftriaxone and azithromycin and hospitalist consulted to admit for further manage treatment of community-acquired pneumonia    Assessment & Plan    Assessment and Plan:  CAP: Started her on IV rocephin and IV zithromax, continue with mucinex. She reports improvement in her symptoms. Continue with IV antibiotics for another 24 hours and transition to oral in the morning.  Pt on RA.  Duonebs as needed.  Will start her on claritin for congestion.  Strep pneumo antigen is negative.    Atrial fibrillation:  Rate controlled with metoprolol .    Hypertension:  BP parameters are better controlled today.  Pt on isosorbide mononitrate 30 mg daily, losartan 100 mg daily and metoprolol.  Added hydralazine prn.    Anxiety.  Pt started on hydroxyzine and haldol added on.     Hyperlipidemia Resume crestor.   Mild AKI Suspect from dehydration and poor oral intake. Creatinine improving, currently at 1.3.  Recheck labs in am an dif creatinine does not improve, please start the patient on IV fluids.  Baseline creatinine around 1.1 .    Macrocytic anemia:  Hemoglobin around 8.  Baseline appears to  be around 11.  ? Hemodilution.  Anemia panel wnl. Check stool for occult blood.    Mild thrombocytopenia 115,000.    Hypomagnesemia:  Replaced.  Recheck in am.      RN Pressure Injury Documentation: Pressure Injury 09/04/23 Buttocks Right Stage 2 -  Partial thickness loss of dermis presenting as a shallow open injury with a red, pink wound bed without slough. .10cm x .02 cm to buttocks (Active)  09/04/23 2229  Location: Buttocks  Location Orientation: Right  Staging: Stage 2 -  Partial thickness loss of dermis presenting as a shallow open injury with a red, pink wound bed without slough.  Wound Description (Comments): .10cm x .02 cm to buttocks  Present on Admission:   Dressing Type Foam - Lift dressing to assess site every shift 09/05/23 2159       Estimated body mass index is 34.16 kg/m as calculated from the following:   Height as of this encounter: 5\' 4"  (1.626 m).   Weight as of this encounter: 90.3 kg.  Code Status: DNR comfort  DVT Prophylaxis:  enoxaparin (LOVENOX) injection 40 mg Start: 09/04/23 2200  Level of Care: Level of care: Med-Surg Family Communication: none at bedside.   Disposition Plan:     Remains inpatient appropriate:  IV antibiotics.   Procedures:  None.   Consultants:   None.   Antimicrobials:   Anti-infectives (From admission, onward)    Start     Dose/Rate Route Frequency Ordered Stop   09/05/23 1500  cefTRIAXone (ROCEPHIN) 2 g in sodium chloride 0.9 % 100 mL IVPB        2 g 200 mL/hr over 30 Minutes Intravenous Every 24 hours 09/04/23 1742 09/09/23 1459   09/05/23 1500  azithromycin (ZITHROMAX) tablet 500 mg        500 mg Oral Daily 09/04/23 1742 09/09/23 0959   09/04/23 1515  cefTRIAXone (ROCEPHIN) 1 g in sodium chloride 0.9 % 100 mL IVPB        1 g 200 mL/hr over 30 Minutes Intravenous  Once 09/04/23 1507 09/04/23 1742   09/04/23 1515  azithromycin (ZITHROMAX) 500 mg in sodium chloride 0.9 % 250 mL IVPB        500 mg 250  mL/hr over 60 Minutes Intravenous  Once 09/04/23 1507 09/04/23 1743        Medications  Scheduled Meds:  azithromycin  500 mg Oral Daily   DULoxetine  60 mg Oral BID   enoxaparin (LOVENOX) injection  40 mg Subcutaneous Q24H   famotidine  20 mg Oral Daily   fenofibrate  160 mg Oral Daily   guaiFENesin  600 mg Oral BID   isosorbide mononitrate  30 mg Oral QHS   levothyroxine  125 mcg Oral Q0600   losartan  100 mg Oral Daily   metoprolol succinate  25 mg Oral QHS   metoprolol succinate  50 mg Oral Daily   rosuvastatin  40 mg Oral Daily   Continuous Infusions:  cefTRIAXone (ROCEPHIN)  IV 2 g (09/06/23 1510)   PRN Meds:.albuterol, haloperidol lactate, hydrALAZINE, hydrOXYzine, nitroGLYCERIN, ondansetron (ZOFRAN) IV **OR** ondansetron    Subjective:   Tammy Suarez was seen and examined today.   Objective:   Vitals:   09/05/23 1814 09/05/23 2049 09/05/23 2159 09/06/23 0614  BP: (!) 155/52 (!) 177/56 (!) 183/79 (!) 157/51  Pulse: 64 65 80 (!) 52  Resp:  20  20  Temp:  98.3 F (36.8 C)  98.3 F (36.8 C)  TempSrc:  Oral  Oral  SpO2:  100%  98%  Weight:      Height:        Intake/Output Summary (Last 24 hours) at 09/06/2023 1737 Last data filed at 09/06/2023 1556 Gross per 24 hour  Intake 469.74 ml  Output 700 ml  Net -230.26 ml   Filed Weights   09/04/23 1232  Weight: 90.3 kg     Exam General exam: Appears calm and comfortable  Respiratory system: Clear to auscultation. Respiratory effort normal. Cardiovascular system: S1 & S2 heard, RRR. Gastrointestinal system: Abdomen is nondistended, soft and nontender.  Central nervous system: Alert and oriented to person and place.  Extremities: Symmetric 5 x 5 power. Skin: No rashes,  Psychiatry: anxious.     Data Reviewed:  I have personally reviewed following labs and imaging studies   CBC Lab Results  Component Value Date   WBC 5.4 09/06/2023   RBC 2.68 (L) 09/06/2023   HGB 8.7 (L) 09/06/2023   HCT  28.0 (L) 09/06/2023   MCV 104.5 (H) 09/06/2023   MCH 32.5 09/06/2023   PLT 115 (L) 09/06/2023   MCHC 31.1  09/06/2023   RDW 15.0 09/06/2023   LYMPHSABS 2.1 09/06/2023   MONOABS 0.7 09/06/2023   EOSABS 0.2 09/06/2023   BASOSABS 0.0 09/06/2023     Last metabolic panel Lab Results  Component Value Date   NA 136 09/06/2023   K 3.6 09/06/2023   CL 103 09/06/2023   CO2 25 09/06/2023   BUN 25 (H) 09/06/2023   CREATININE 1.39 (H) 09/06/2023   GLUCOSE 137 (H) 09/06/2023   GFRNONAA 39 (L) 09/06/2023   GFRAA 35 (L) 10/04/2020   CALCIUM 8.7 (L) 09/06/2023   PROT 6.1 (L) 09/04/2023   ALBUMIN 2.5 (L) 09/04/2023   LABGLOB 2.8 05/05/2023   AGRATIO 1.3 05/27/2022   BILITOT 1.0 09/04/2023   ALKPHOS 84 09/04/2023   AST 24 09/04/2023   ALT 11 09/04/2023   ANIONGAP 8 09/06/2023    CBG (last 3)  No results for input(s): "GLUCAP" in the last 72 hours.    Coagulation Profile: No results for input(s): "INR", "PROTIME" in the last 168 hours.   Radiology Studies: No results found.     Kathlen Mody M.D. [facility] 09/06/2023, 5:37 PM  Available via Epic secure chat 7am-7pm After 7 pm, please refer to night coverage provider listed on amion.

## 2023-09-06 NOTE — Plan of Care (Signed)
  Problem: Acute Rehab PT Goals(only PT should resolve) Goal: Pt Will Go Supine/Side To Sit Outcome: Progressing Flowsheets (Taken 09/06/2023 1356) Pt will go Supine/Side to Sit: with modified independence Goal: Patient Will Transfer Sit To/From Stand Outcome: Progressing Flowsheets (Taken 09/06/2023 1356) Patient will transfer sit to/from stand:  with modified independence  with supervision Goal: Pt Will Transfer Bed To Chair/Chair To Bed Outcome: Progressing Flowsheets (Taken 09/06/2023 1356) Pt will Transfer Bed to Chair/Chair to Bed:  with modified independence  with supervision Goal: Pt Will Ambulate Outcome: Progressing Flowsheets (Taken 09/06/2023 1356) Pt will Ambulate:  50 feet  with contact guard assist  with supervision  with rolling walker   1:57 PM, 09/06/23 Tammy Suarez, MPT Physical Therapist with [facility] [phone] office [phone] mobile phone

## 2023-09-07 DIAGNOSIS — I10 Essential (primary) hypertension: Secondary | ICD-10-CM | POA: Diagnosis not present

## 2023-09-07 DIAGNOSIS — E440 Moderate protein-calorie malnutrition: Secondary | ICD-10-CM | POA: Diagnosis present

## 2023-09-07 DIAGNOSIS — D539 Nutritional anemia, unspecified: Secondary | ICD-10-CM | POA: Diagnosis not present

## 2023-09-07 DIAGNOSIS — J189 Pneumonia, unspecified organism: Secondary | ICD-10-CM | POA: Diagnosis not present

## 2023-09-07 DIAGNOSIS — R531 Weakness: Secondary | ICD-10-CM | POA: Diagnosis not present

## 2023-09-07 LAB — LEGIONELLA PNEUMOPHILA SEROGP 1 UR AG: L. pneumophila Serogp 1 Ur Ag: NEGATIVE

## 2023-09-07 LAB — TROPONIN I (HIGH SENSITIVITY)
Troponin I (High Sensitivity): 75 ng/L — ABNORMAL HIGH (ref <18)
Troponin I (High Sensitivity): 76 ng/L — ABNORMAL HIGH (ref <18)

## 2023-09-07 MED ORDER — GUAIFENESIN-DM 100-10 MG/5ML PO SYRP: 10.0000 mL | ORAL_SOLUTION | ORAL | Status: DC | PRN

## 2023-09-07 MED ORDER — LORATADINE 10 MG PO TABS
10.0000 mg | ORAL_TABLET | Freq: Every day | ORAL | Status: DC
  Administered 2023-09-08 – 2023-09-11 (×3): 10 mg via ORAL
  Filled 2023-09-07 (×4): qty 1

## 2023-09-07 MED ORDER — ENSURE ENLIVE PO LIQD
237.0000 mL | Freq: Three times a day (TID) | ORAL | Status: DC
  Administered 2023-09-07 – 2023-09-11 (×3): 237 mL via ORAL

## 2023-09-07 MED ORDER — LORAZEPAM 2 MG/ML IJ SOLN
1.0000 mg | Freq: Once | INTRAMUSCULAR | Status: AC
  Administered 2023-09-07: 2 mg via INTRAVENOUS
  Filled 2023-09-07: qty 1

## 2023-09-07 MED ORDER — OLANZAPINE 5 MG PO TABS
2.5000 mg | ORAL_TABLET | Freq: Every day | ORAL | Status: DC
  Administered 2023-09-07 – 2023-09-16 (×9): 2.5 mg via ORAL
  Filled 2023-09-07 (×10): qty 1

## 2023-09-07 MED ORDER — ADULT MULTIVITAMIN W/MINERALS CH
1.0000 | ORAL_TABLET | Freq: Every day | ORAL | Status: DC
  Administered 2023-09-07 – 2023-09-11 (×4): 1 via ORAL
  Filled 2023-09-07 (×5): qty 1

## 2023-09-07 NOTE — Plan of Care (Signed)

## 2023-09-07 NOTE — Progress Notes (Signed)
Initial Nutrition Assessment  DOCUMENTATION CODES:   Non-severe (moderate) malnutrition in context of chronic illness  INTERVENTION:   Ensure Plus High Protein po TID, each supplement provides 350 kcal and 20 grams of protein. MVI with minerals daily.  NUTRITION DIAGNOSIS:   Moderate Malnutrition related to chronic illness (CHF) as evidenced by mild fat depletion, mild muscle depletion.  GOAL:   Patient will meet greater than or equal to 90% of their needs  MONITOR:   PO intake, Supplement acceptance  REASON FOR ASSESSMENT:   Malnutrition Screening Tool    ASSESSMENT:   77 yo female admitted with CAP. PMH includes a fib, CHF, CKD, GERD, HLD, hypothyroidism, vitamin B-12 deficiency, DM-2, HTN, MI, coronary atherosclerosis.  Patient reports that she has been eating okay. She has a poor appetite. She thinks she has lost weight, but unsure of amount. Suspect intake at home has been poor for some time. Since admission, she has been consuming 10-25% of meals.   Weight hx reviewed. Usual weight 80-85 kg, admission weight 90.3 kg. Increase in weight likely d/t edema.   Labs reviewed. Mag 1.4 (10/20 and 10/21), K WNL, Phos N/A  Medications reviewed and include pepcid, IV rocephin.  NUTRITION - FOCUSED PHYSICAL EXAM:  Flowsheet Row Most Recent Value  Orbital Region Mild depletion  Upper Arm Region No depletion  Thoracic and Lumbar Region No depletion  Buccal Region Mild depletion  Temple Region Mild depletion  Clavicle Bone Region Mild depletion  Clavicle and Acromion Bone Region No depletion  Scapular Bone Region No depletion  Dorsal Hand Mild depletion  Patellar Region Mild depletion  Anterior Thigh Region Mild depletion  Posterior Calf Region Mild depletion  Hair Reviewed  Eyes Reviewed  Mouth Reviewed  Skin Reviewed  Nails Reviewed       Diet Order:   Diet Order             Diet Heart Room service appropriate? Yes; Fluid consistency: Thin  Diet effective  now                   EDUCATION NEEDS:   Not appropriate for education at this time  Skin:  Skin Assessment: Skin Integrity Issues: Skin Integrity Issues:: Stage II Stage II: R buttocks  Last BM:  10/22  Height:   Ht Readings from Last 1 Encounters:  09/04/23 5\' 4"  (1.626 m)    Weight:   Wt Readings from Last 1 Encounters:  09/04/23 90.3 kg    Ideal Body Weight:  54.5 kg  BMI:  Body mass index is 34.16 kg/m.  Estimated Nutritional Needs:   Kcal:  1800-2000  Protein:  85-100 gm  Fluid:  1.8-2 L   Gabriel Rainwater RD, LDN, CNSC Please refer to Amion for contact information.

## 2023-09-07 NOTE — Progress Notes (Signed)
Physical Therapy Treatment Patient Details Name: Tammy Suarez MRN: [id number] DOB: [birth year] Today's Date: 09/07/2023   History of Present Illness Tammy Suarez is a 77 y.o. female with medical history significant of A-fib, CHF, CKD, GERD, HLD, and hypothyroidism who presented to the ED for evaluation of generalized weakness and cough.  Per patient and family patient has not felt well for over 3 weeks.  Over the last 4 to 5 days, she had developed cough and cold symptoms.  She has had a productive cough with yellowish/greenish sputum.  Per family, patient has slid down her bed multiple times due to generalized weakness.  She reports associated nasal congestion and occasional shortness of breath but denies any fevers, chills, chest pain, abdominal pain or headaches.    PT Comments  Pt supine in bed and willintg to participate.  Pt with mod I bed mobility and transfer training, just slow labored movements and c/o SOB.  O2 saturation at 97% room air.  Ambulated 49ft with RW, no LOB episodes.  EOS pt left in chair with call bell within reach.  Seated therex complete for strengthening.      If plan is discharge home, recommend the following:     Can travel by private vehicle        Equipment Recommendations       Recommendations for Other Services       Precautions / Restrictions Precautions Precautions: Fall Restrictions Weight Bearing Restrictions: No     Mobility  Bed Mobility Overal bed mobility: Modified Independent Bed Mobility: Supine to Sit     Supine to sit: Modified independent (Device/Increase time), Supervision Sit to supine: Supervision   General bed mobility comments: slightly labored movement    Transfers Overall transfer level: Needs assistance Equipment used: Rolling walker (2 wheels) Transfers: Sit to/from Stand Sit to Stand: Contact guard assist           General transfer comment: slow labored movement    Ambulation/Gait Ambulation/Gait  assistance: Contact guard assist, Min assist Gait Distance (Feet): 30 Feet Assistive device: Rolling walker (2 wheels) Gait Pattern/deviations: Decreased step length - right, Decreased step length - left, Decreased stride length Gait velocity: decreased     General Gait Details: slow labored unsteady cadence without loss of balance and limited mostly due to c/o fatigue and generalized weakness   Stairs             Wheelchair Mobility     Tilt Bed    Modified Rankin (Stroke Patients Only)       Balance                                            Cognition Arousal: Alert Behavior During Therapy: WFL for tasks assessed/performed Overall Cognitive Status: Within Functional Limits for tasks assessed                                          Exercises General Exercises - Lower Extremity Long Arc Quad: AROM, 10 reps, Seated, Strengthening Hip Flexion/Marching: AROM, 10 reps, Seated, Strengthening Toe Raises: AROM, 10 reps, Seated, Strengthening Heel Raises: AROM, Strengthening, 10 reps, Seated    General Comments        Pertinent Vitals/Pain Pain Assessment Pain Assessment: No/denies pain    Home Living  Prior Function            PT Goals (current goals can now be found in the care plan section)      Frequency           PT Plan      Co-evaluation              AM-PAC PT "6 Clicks" Mobility   Outcome Measure  Help needed turning from your back to your side while in a flat bed without using bedrails?: None Help needed moving from lying on your back to sitting on the side of a flat bed without using bedrails?: A Little Help needed moving to and from a bed to a chair (including a wheelchair)?: A Little Help needed standing up from a chair using your arms (e.g., wheelchair or bedside chair)?: A Little Help needed to walk in hospital room?: A Little Help needed climbing 3-5  steps with a railing? : A Lot 6 Click Score: 18    End of Session Equipment Utilized During Treatment: Gait belt   Patient left: in chair;with call bell/phone within reach Nurse Communication: Mobility status       Time: 0981-1914 PT Time Calculation (min) (ACUTE ONLY): 20 min  Charges:    $Therapeutic Activity: 8-22 mins PT General Charges $$ ACUTE PT VISIT: 1 Visit                     Becky Sax, LPTA/CLT; CBIS [phone]  Juel Burrow 09/07/2023, 10:12 AM

## 2023-09-07 NOTE — Progress Notes (Signed)
[facility]                                                                               Tammy Suarez, is a 77 y.o. female, DOB - Jun 22, 1946, ZOX:[id number] Admit date - 09/04/2023    Outpatient Primary MD for the patient is Kerri Perches, MD  LOS - 3  days    Brief summary   [name] is a 77 y.o. female with medical history significant of A-fib, CHF, CKD, GERD, HLD, and hypothyroidism who presented to the ED for evaluation of generalized weakness and cough.  Per patient and family patient has not felt well for over 3 weeks.    Significant labs includes creatinine 1.48, negative troponins, negative COVID test, negative UA, BNP of 601 and normal TSH. CXR shows evidence of left lower lung pneumonia superimposed on small left pleural effusion. Patient started on ceftriaxone and azithromycin and hospitalist consulted to admit for further manage treatment of community-acquired pneumonia    Assessment & Plan    Assessment and Plan:  CAP: Started her on IV rocephin and IV zithromax, continue with mucinex. She reports improvement in her symptoms.  Pt on RA.  Duonebs as needed.  Will start her on claritin for congestion.  Strep pneumo antigen is negative.  Improving with IV Antibiotics, transition to oral antibiotics on discharge.    Atrial fibrillation:  Rate controlled with metoprolol .    Hypertension:  BP parameters elevated, suboptimal, probably due to agitation.  Pt on isosorbide mononitrate 30 mg daily, losartan 100 mg daily and metoprolol.  Added hydralazine prn.    Anxiety, with episodes of agitation Suspect  hospital delirium  Pt started on hydroxyzine and haldol without any improvement.  Added zyprexa.    Hyperlipidemia Resume crestor.   Mild AKI Suspect from dehydration and poor oral intake. Creatinine improving, currently at 1.3 with IV fluids.  Baseline creatinine around 1.1 .    Macrocytic anemia:  Hemoglobin around 8.   Baseline appears to be around 11.  ? Hemodilution.  Anemia panel wnl. Check stool for occult blood.    Mild thrombocytopenia 115,000.    Hypomagnesemia:  Replaced.  Recheck in am.     Chest pain With mildly elevated troponin of 75 and 76,  EKG shows non specific st t wave changes, and t wave inversions.  Get echocardiogram for further evaluation.  Chest pain resolved    Hypothyroidism:  Resume synthroid 125 mcg daily.    RN Pressure Injury Documentation:     Interventions: Ensure Enlive (each supplement provides 350kcal and 20 grams of protein), MVI  Estimated body mass index is 34.16 kg/m as calculated from the following:   Height as of this encounter: 5\' 4"  (1.626 m).   Weight as of this encounter: 90.3 kg.  Code Status: DNR comfort  DVT Prophylaxis:  enoxaparin (LOVENOX) injection 40 mg Start: 09/04/23 2200   Level of Care: Level of care: Med-Surg Family Communication: none at bedside.   Disposition Plan:     Remains inpatient appropriate:  IV antibiotics.   Procedures:  None.   Consultants:   None.   Antimicrobials:   Anti-infectives (From admission, onward)  Start     Dose/Rate Route Frequency Ordered Stop   09/05/23 1500  cefTRIAXone (ROCEPHIN) 2 g in sodium chloride 0.9 % 100 mL IVPB        2 g 200 mL/hr over 30 Minutes Intravenous Every 24 hours 09/04/23 1742 09/09/23 1459   09/05/23 1500  azithromycin (ZITHROMAX) tablet 500 mg        500 mg Oral Daily 09/04/23 1742 09/09/23 0959   09/04/23 1515  cefTRIAXone (ROCEPHIN) 1 g in sodium chloride 0.9 % 100 mL IVPB        1 g 200 mL/hr over 30 Minutes Intravenous  Once 09/04/23 1507 09/04/23 1742   09/04/23 1515  azithromycin (ZITHROMAX) 500 mg in sodium chloride 0.9 % 250 mL IVPB        500 mg 250 mL/hr over 60 Minutes Intravenous  Once 09/04/23 1507 09/04/23 1743        Medications  Scheduled Meds:  azithromycin  500 mg Oral Daily   DULoxetine  60 mg Oral BID   enoxaparin (LOVENOX)  injection  40 mg Subcutaneous Q24H   famotidine  20 mg Oral Daily   feeding supplement  237 mL Oral TID BM   fenofibrate  160 mg Oral Daily   guaiFENesin  600 mg Oral BID   isosorbide mononitrate  30 mg Oral QHS   levothyroxine  125 mcg Oral Q0600   losartan  100 mg Oral Daily   metoprolol succinate  25 mg Oral QHS   metoprolol succinate  50 mg Oral Daily   multivitamin with minerals  1 tablet Oral Daily   OLANZapine  2.5 mg Oral Daily   rosuvastatin  40 mg Oral Daily   Continuous Infusions:  cefTRIAXone (ROCEPHIN)  IV 2 g (09/07/23 1509)   PRN Meds:.albuterol, guaiFENesin-dextromethorphan, haloperidol lactate, hydrALAZINE, hydrOXYzine, nitroGLYCERIN, ondansetron (ZOFRAN) IV **OR** ondansetron, senna-docusate    Subjective:   Adiline Tokunaga was seen and examined today.   Objective:   Vitals:   09/07/23 0844 09/07/23 1149 09/07/23 1247 09/07/23 1454  BP: (!) 189/66 (!) 171/74  (!) 160/54  Pulse: 60 (!) 59  (!) 55  Resp:  20  19  Temp:  98 F (36.7 C)  98.2 F (36.8 C)  TempSrc:  Oral    SpO2:  99% 98% 99%  Weight:      Height:        Intake/Output Summary (Last 24 hours) at 09/07/2023 1725 Last data filed at 09/07/2023 0800 Gross per 24 hour  Intake 680 ml  Output 900 ml  Net -220 ml   Filed Weights   09/04/23 1232  Weight: 90.3 kg     Exam General exam: Appears calm and comfortable  Respiratory system: Clear to auscultation. Respiratory effort normal. Cardiovascular system: S1 & S2 heard, RRR.  Gastrointestinal system: Abdomen is nondistended, soft and nontender.  Central nervous system: Alert and oriented to place and person, confused.  Extremities: Symmetric 5 x 5 power. Skin: No rashes,  Psychiatry: anxious and agitated.    Data Reviewed:  I have personally reviewed following labs and imaging studies   CBC Lab Results  Component Value Date   WBC 5.4 09/06/2023   RBC 2.68 (L) 09/06/2023   HGB 8.7 (L) 09/06/2023   HCT 28.0 (L) 09/06/2023    MCV 104.5 (H) 09/06/2023   MCH 32.5 09/06/2023   PLT 115 (L) 09/06/2023   MCHC 31.1 09/06/2023   RDW 15.0 09/06/2023   LYMPHSABS 2.1 09/06/2023   MONOABS 0.7 09/06/2023  EOSABS 0.2 09/06/2023   BASOSABS 0.0 09/06/2023     Last metabolic panel Lab Results  Component Value Date   NA 136 09/06/2023   K 3.6 09/06/2023   CL 103 09/06/2023   CO2 25 09/06/2023   BUN 25 (H) 09/06/2023   CREATININE 1.39 (H) 09/06/2023   GLUCOSE 137 (H) 09/06/2023   GFRNONAA 39 (L) 09/06/2023   GFRAA 35 (L) 10/04/2020   CALCIUM 8.7 (L) 09/06/2023   PROT 6.1 (L) 09/04/2023   ALBUMIN 2.5 (L) 09/04/2023   LABGLOB 2.8 05/05/2023   AGRATIO 1.3 05/27/2022   BILITOT 1.0 09/04/2023   ALKPHOS 84 09/04/2023   AST 24 09/04/2023   ALT 11 09/04/2023   ANIONGAP 8 09/06/2023    CBG (last 3)  No results for input(s): "GLUCAP" in the last 72 hours.    Coagulation Profile: No results for input(s): "INR", "PROTIME" in the last 168 hours.   Radiology Studies: No results found.     Kathlen Mody M.D. [facility] 09/07/2023, 5:25 PM  Available via Epic secure chat 7am-7pm After 7 pm, please refer to night coverage provider listed on amion.

## 2023-09-07 NOTE — Progress Notes (Signed)
Patient found trying to get out of bed to leave. Upon attempting to redirect the patient she became aggressive towards staff and refused to get back in bed. After myself, Caleb Hanks NT+3, Kelly NT+3, and Jeanmarie Hubert LPN got the patient back in the bed she repeatedly tried to get out of bed. Physician Blake Divine notified.

## 2023-09-07 NOTE — Care Management Important Message (Signed)
Important Message  Patient Details  Name: Tammy Suarez MRN: [id number] Date of Birth: [birth year]   Important Message Given:  Yes - Medicare IM     Corey Harold 09/07/2023, 10:55 AM

## 2023-09-07 NOTE — TOC Transition Note (Signed)
Transition of Care [facility]) - CM/SW Discharge Note   Patient Details  Name: Jakeisha Brickell MRN: [id number] Date of Birth: Mar 09, 1946  Transition of Care [facility]) CM/SW Contact:  Villa Herb, LCSWA Phone Number: 09/07/2023, 11:35 AM   Clinical Narrative:    CSW spoke with pts daughter who is understanding that pt was not recommended for SNF and pt wishes to return home with [facility]. Pt was set up for [facility] with [facility]. CSW spoke to [place] rep with [facility] who agrees to adding OT/RN, CSW requested that MD update orders. CSW provided [facility] agency with daughters contact as she best contact. TOC signing off.   Final next level of care: Home w Home Health Services Barriers to Discharge: Barriers Resolved   Patient Goals and CMS Choice CMS Medicare.gov Compare Post Acute Care list provided to:: Patient Choice offered to / list presented to : Patient  Discharge Placement                         Discharge Plan and Services Additional resources added to the After Visit Summary for       Post Acute Care Choice: Home Health                    HH Arranged: PT, RN, OT [facility] Agency: [employer] [facility] Date [facility] Agency Contacted: 09/07/23   Representative spoke with at [facility] Agency: Clydie Braun  Social Determinants of Health (SDOH) Interventions SDOH Screenings   Food Insecurity: No Food Insecurity (09/04/2023)  Housing: Low Risk  (09/04/2023)  Transportation Needs: No Transportation Needs (09/04/2023)  Utilities: Not At Risk (09/04/2023)  Alcohol Screen: Low Risk  (04/22/2023)  Depression (PHQ2-9): High Risk (05/05/2023)  Financial Resource Strain: Low Risk  (04/22/2023)  Recent Concern: Financial Resource Strain - Medium Risk (02/24/2023)   Received from [facility], [facility]  Physical Activity: Inactive (04/22/2023)  Social Connections: Socially Isolated (04/22/2023)  Stress: No Stress Concern Present (04/22/2023)  Tobacco Use: High Risk (09/04/2023)     Readmission Risk  Interventions    09/06/2023    1:16 PM  Readmission Risk Prevention Plan  Transportation Screening Complete  PCP or Specialist Appt within 5-7 Days Not Complete  Home Care Screening Complete  Medication Review (RN CM) Complete

## 2023-09-07 NOTE — Plan of Care (Signed)
Patient has been out of bed walking multiple times today. Hopeful for discharge.

## 2023-09-08 ENCOUNTER — Inpatient Hospital Stay (HOSPITAL_COMMUNITY): Payer: Medicare HMO

## 2023-09-08 DIAGNOSIS — R079 Chest pain, unspecified: Secondary | ICD-10-CM

## 2023-09-08 DIAGNOSIS — R7989 Other specified abnormal findings of blood chemistry: Secondary | ICD-10-CM | POA: Diagnosis not present

## 2023-09-08 DIAGNOSIS — R0789 Other chest pain: Secondary | ICD-10-CM

## 2023-09-08 DIAGNOSIS — J189 Pneumonia, unspecified organism: Secondary | ICD-10-CM | POA: Diagnosis not present

## 2023-09-08 LAB — CBC WITH DIFFERENTIAL/PLATELET
Abs Immature Granulocytes: 0.01 10*3/uL (ref 0.00–0.07)
Basophils Absolute: 0 10*3/uL (ref 0.0–0.1)
Basophils Relative: 1 %
Eosinophils Absolute: 0.1 10*3/uL (ref 0.0–0.5)
Eosinophils Relative: 2 %
HCT: 30 % — ABNORMAL LOW (ref 36.0–46.0)
Hemoglobin: 9.4 g/dL — ABNORMAL LOW (ref 12.0–15.0)
Immature Granulocytes: 0 %
Lymphocytes Relative: 36 %
Lymphs Abs: 1.6 10*3/uL (ref 0.7–4.0)
MCH: 32.9 pg (ref 26.0–34.0)
MCHC: 31.3 g/dL (ref 30.0–36.0)
MCV: 104.9 fL — ABNORMAL HIGH (ref 80.0–100.0)
Monocytes Absolute: 0.7 10*3/uL (ref 0.1–1.0)
Monocytes Relative: 15 %
Neutro Abs: 2.1 10*3/uL (ref 1.7–7.7)
Neutrophils Relative %: 46 %
Platelets: 112 10*3/uL — ABNORMAL LOW (ref 150–400)
RBC: 2.86 MIL/uL — ABNORMAL LOW (ref 3.87–5.11)
RDW: 15.4 % (ref 11.5–15.5)
WBC: 4.5 10*3/uL (ref 4.0–10.5)
nRBC: 0 % (ref 0.0–0.2)

## 2023-09-08 LAB — ECHOCARDIOGRAM COMPLETE
Area-P 1/2: 2.66 cm2
Height: 64 in
S' Lateral: 3.9 cm
Weight: 3184 [oz_av]

## 2023-09-08 LAB — BASIC METABOLIC PANEL
Anion gap: 7 (ref 5–15)
BUN: 24 mg/dL — ABNORMAL HIGH (ref 8–23)
CO2: 25 mmol/L (ref 22–32)
Calcium: 9.1 mg/dL (ref 8.9–10.3)
Chloride: 104 mmol/L (ref 98–111)
Creatinine, Ser: 1.47 mg/dL — ABNORMAL HIGH (ref 0.44–1.00)
GFR, Estimated: 37 mL/min — ABNORMAL LOW (ref >60)
Glucose, Bld: 114 mg/dL — ABNORMAL HIGH (ref 70–99)
Potassium: 3.6 mmol/L (ref 3.5–5.1)
Sodium: 136 mmol/L (ref 135–145)

## 2023-09-08 LAB — MAGNESIUM: Magnesium: 1.6 mg/dL — ABNORMAL LOW (ref 1.7–2.4)

## 2023-09-08 MED ORDER — EZETIMIBE 10 MG PO TABS
10.0000 mg | ORAL_TABLET | Freq: Every day | ORAL | Status: DC
  Administered 2023-09-09 – 2023-09-11 (×2): 10 mg via ORAL
  Filled 2023-09-08 (×3): qty 1

## 2023-09-08 MED ORDER — IOHEXOL 300 MG/ML  SOLN
60.0000 mL | Freq: Once | INTRAMUSCULAR | Status: AC | PRN
  Administered 2023-09-08: 60 mL via INTRAVENOUS

## 2023-09-08 MED ORDER — MAGNESIUM OXIDE -MG SUPPLEMENT 400 (240 MG) MG PO TABS
400.0000 mg | ORAL_TABLET | Freq: Every day | ORAL | Status: DC
  Administered 2023-09-08 – 2023-09-09 (×2): 400 mg via ORAL
  Filled 2023-09-08 (×3): qty 1

## 2023-09-08 NOTE — Progress Notes (Signed)
Progress Note   Patient: Tammy Suarez WUJ:[id number] DOB: 1946-05-30 DOA: 09/04/2023     4 DOS: the patient was seen and examined on 09/08/2023   Brief hospital course: 77yo with h/o PAF, HLD, and hypothyroidism who presented on 10/19 with cough and generalized weakness, found to have LLL PNA.  She was started on Ceftriaxone and Azithromycin.  Assessment and Plan:  CAP Started on IV rocephin and IV zithromax, continue with mucinex Improving symptoms, on room air Duonebs as needed Started on claritin for congestion Strep pneumo antigen is negative Improving with IV Antibiotics, will be complete after today's doses She did have an abnormal chest CT in 02/2023 at [facility] with findings "highly concerning for primary lung malignancy" Her daughter reports that she was unaware of this Will repeat chest CT  Dementia Recent onset, rapidly progressive according to her daughter Hypersomnolent today - possibly due to medications and sleep cycle disturbance  Anxiety, with episodes of agitation while hospitalized, likely due to hospital delirium  Pt started on hydroxyzine and haldol without any improvement Added zyprexa   Atrial fibrillation Rate controlled with metoprolol  Not on AC due to questionable diagnosis    Hypertension Continue Toprol XL, losartan Added hydralazine prn   Hyperlipidemia Continue rosuvastatin and Zetia   Stage 3b CKD Thought to be AKI on presentation but appears to be stable and at/near baseline Attempt to avoid nephrotoxic medications Recheck BMP in AM    Macrocytic anemia Appears to be stable   Thrombocytopenia Also stable Recheck CBC in AM   Hypomagnesemia Repleted Recheck in am   Chest pain S/p CABG 2014 Reported chest pain yesterday, now apparently resolved With mildly elevated troponin of 75 and 76,  EKG shows non specific st t wave changes, and t wave inversions Prior echo in 02/2023 was unremarkable  Repeat echo is ordered Cardiology  consulted and is not planning ischemic evaluation Appears to need resumption of ASA 81 mg daily    Hypothyroidism Resume synthroid 125 mcg daily  Moderate malnutrition Nutrition Problem: Moderate Malnutrition Etiology: chronic illness (CHF) Signs/Symptoms: mild fat depletion, mild muscle depletion Interventions: Ensure Enlive (each supplement provides 350kcal and 20 grams of protein), MVI   Obesity Body mass index is 34.16 kg/m.Marland Kitchen  Weight loss should be encouraged Outpatient PCP/bariatric medicine/bariatric surgery f/u encouraged      Consultants: PT TOC team  Procedures: None  Antibiotics: Ceftriaxone 10/19-23 Azithromycin 10/19-23  30 Day Unplanned Readmission Risk Score    Flowsheet Row ED to Hosp-Admission (Current) from 09/04/2023 in [facility]  30 Day Unplanned Readmission Risk Score (%) 17.95 Filed at 09/08/2023 0801       This score is the patient's risk of an unplanned readmission within 30 days of being discharged (0 -100%). The score is based on dignosis, age, lab data, medications, orders, and past utilization.   Low:  0-14.9   Medium: 15-21.9   High: 22-29.9   Extreme: 30 and above           Subjective: Somnolent.  Denied pain including chest pain.  No specific concerns.  I spoke with her daughter, unsure what kind of night she had.  Sometimes she sleeps all day.  She has dementia and "it hit all of the sudden and hard."  She has been having some rough nights.  Her daughter is hoping to bring her home but is not sure.  She was restless and got some medications last night.     Objective: Vitals:   09/07/23  1454 09/08/23 0551  BP: (!) 160/54 (!) 162/73  Pulse: (!) 55 (!) 54  Resp: 19 20  Temp: 98.2 F (36.8 C) 98.7 F (37.1 C)  SpO2: 99% 97%    Intake/Output Summary (Last 24 hours) at 09/08/2023 1329 Last data filed at 09/08/2023 1300 Gross per 24 hour  Intake 360 ml  Output 500 ml  Net -140 ml   Filed Weights    09/04/23 1232  Weight: 90.3 kg    Exam:  General:  Appears calm and comfortable and is in NAD, somnolent, on RA Eyes:  EOMI, normal lids, iris ENT:  grossly normal hearing, lips & tongue, mmm Neck:  no LAD, masses or thyromegaly Cardiovascular:  RRR, no m/r/g. No LE edema.  Respiratory:   CTA bilaterally with no wheezes/rales/rhonchi.  Normal respiratory effort. Abdomen:  soft, NT, ND Skin:  no rash or induration seen on limited exam Musculoskeletal:  grossly normal tone BUE/BLE, good ROM, no bony abnormality Psychiatric:  somnolent mood and affect, speech sparse but appropriate Neurologic:  CN 2-12 grossly intact, moves all extremities in coordinated fashion  Data Reviewed: I have reviewed the patient's lab results since admission.  Pertinent labs for today include:   Glucose 114 BUN 24/Creatinine 1.47/GFR 37 - stable WBC 4.5 Hgb 9.4 Platelets 112  Troponin 75, 76 - up from 23 on 10/19   Family Communication: None present; I was unable to reach her friend or granddaughter; I spoke with her daughter by telephone  Disposition: Status is: Inpatient Remains inpatient appropriate because: ongoing management Completing abx 10/23 Chest CT pending Hypersomnolent - delirium + meds?  Once these issues are stable, she is likely appropriate for dc to home with HHN (at family request)     Time spent: 50 minutes  Unresulted Labs (From admission, onward)     Start     Ordered   09/09/23 0500  CBC with Differential/Platelet  Tomorrow morning,   R       Question:  Specimen collection method  Answer:  Lab=Lab collect   09/08/23 1326   09/09/23 0500  Basic metabolic panel  Tomorrow morning,   R       Question:  Specimen collection method  Answer:  Lab=Lab collect   09/08/23 1326   09/09/23 0500  Magnesium  Tomorrow morning,   R       Question:  Specimen collection method  Answer:  Lab=Lab collect   09/08/23 1327             Author: Jonah Blue, MD 09/08/2023 1:29  PM  For on call review [email or web address].

## 2023-09-08 NOTE — Consult Note (Addendum)
Cardiology Consultation   Patient ID: Tammy Suarez MRN: [id number]; DOB: 1946-05-02  Admit date: 09/04/2023 Date of Consult: 09/08/2023  PCP:  Kerri Perches, MD   [facility] Providers Cardiologist:  Nona Dell, MD        Patient Profile:   Tammy Suarez is a 77 y.o. female with a hx of CAD (s/p CABG in 10/2013 with LIMA-LAD, SVG-OM and SVG-PDA, low-risk NST in 2019), HTN, HLD, Type 2 DM and Stage III CKD who is being seen 09/08/2023 for the evaluation of chest pain at the request of Dr. Ophelia Charter.  History of Present Illness:   Tammy Suarez was last examined by Jacolyn Reedy, PA in 05/2023 and had recently been hospitalized with a COPD and CHF exacerbation. Was found to have an NSTEMI with peak troponin of 1003 and enzyme elevation was felt to be secondary to demand ischemia and was recommended to consider outpatient ischemic evaluation. She was found to have a consolidation in the lung concerning for a malignancy and follow-up with Pulmonology was recommended. At the time of her visit, she reported having constant breast pain since a biopsy in the past and did experience constant pain along her left chest and shoulder which was relieved with pain medications. Her pain was overall felt to be atypical for a cardiac etiology and further testing was not pursued. She was noted to have atrial tachycardia during her admission and a 2-week Zio patch was recommended for further assessment. This ultimately showed predominantly normal sinus rhythm with an average heart rate of 82 bpm. She did have frequent PAC's representing 70% of total beats and very brief SVT but no sustained arrhythmias. Toprol-XL was increased to 50 mg in AM/25 mg in PM.  She presented to [facility] ED on 09/04/2023 for evaluation of weakness, cough and congestion. Initial labs showed WBC 4.4, Hgb 9.3, platelets 117, Na+ 134, K+ 3.7 and creatinine 1.48 (previously 1.08 in 04/2023), AST 24, ALT 11, BNP 601, TSH  2.599. Initial and repeat Hs Troponin values mildly elevated at 24 and 23. Negative for COVID. CXR showed a new area of atelectasis and or consolidation along the left lung base with superimposed small left pleural effusion and follow-up imaging recommended in 3 to 4 weeks. EKG showed significant artifact but appears most consistent with sinus bradycardia, heart rate 58 with anterior infarct pattern.  She was admitted for further management of community-acquired pneumonia and started on antibiotic therapy.  Given her small left pleural effusion, she did receive IV Lasix 20 mg on admission. By review of notes, she has been experiencing altered mental status throughout admission and having episodes of becoming aggressive towards staff yesterday. She did report episodes of chest pain and repeat troponin values were obtained and mildly elevated at 75 and 76. A repeat echocardiogram was ordered and is scheduled for today.  In talking with the patient today, she will awaken but quickly goes back to sleep. It appears she did receive Haldol , Ativan and Atarax yesterday but no additional doses today. Was started on Zyprexa and received her second dose of this today. She denies any current pain. Says she still feels short of breath. Says that she is not hungry and her breakfast tray is at the bedside and has not been touched. No family currently at the bedside to contribute to history.   Past Medical History:  Diagnosis Date   Arthritis    Atrial fibrillation (HCC)    Carpal tunnel syndrome    CHF (  congestive heart failure) (HCC)    Chronic back pain    CKD (chronic kidney disease) stage 3, GFR 30-59 ml/min (HCC)    Coronary atherosclerosis of native coronary artery    Multivessel status post CABG - LIMA to LAD, SVG to OM and SVG to PDA   Depression    Economic stress    Essential hypertension    GERD (gastroesophageal reflux disease)    Headache    History of conjunctivitis    History of hiatal hernia     Hyperlipidemia    Hypothyroidism    Myocardial infarction (HCC) 1999   Obesity    Type 2 diabetes mellitus (HCC)    Uterine cancer (HCC)    Vitamin B 12 deficiency     Past Surgical History:  Procedure Laterality Date   ABDOMINAL HYSTERECTOMY     BACK SURGERY     BREAST BIOPSY Left 04/02/2022   BENIGN BREAST PARENCHYMA WITH FOCAL DYSTROPHIC CALCIFICATION   CARDIAC SURGERY     CARPAL TUNNEL RELEASE Left 07/23/2016   Procedure: CARPAL TUNNEL RELEASE;  Surgeon: Vickki Hearing, MD;  Location: [facility] ORS;  Service: Orthopedics;  Laterality: Left;   CERVICAL LAMINECTOMY     CHOLECYSTECTOMY     COLONOSCOPY  2007   Dr. Elpidio Anis: reported to be normal   COLONOSCOPY WITH PROPOFOL N/A 04/29/2020   Procedure: COLONOSCOPY WITH PROPOFOL;  Surgeon: Corbin Ade, MD;  Location: [facility] ENDO SUITE;  Service: Endoscopy;  Laterality: N/A;  12:45pm   CORONARY ARTERY BYPASS GRAFT N/A 11/02/2013   Procedure: CORONARY ARTERY BYPASS GRAFTING (CABG);  Surgeon: Alleen Borne, MD;  Location[facility];  Service: Open Heart Surgery;  Laterality: N/A;  CABG x three, using left internal mammary artery and right leg greater saphenous vein harvested endoscopically   COSMETIC SURGERY  07/01/2012   Recurrent skin infection on lower abdomen, [facility]   HERNIA REPAIR     Inguinal herniorrhaphy     INTRAOPERATIVE TRANSESOPHAGEAL ECHOCARDIOGRAM N/A 11/02/2013   Procedure: INTRAOPERATIVE TRANSESOPHAGEAL ECHOCARDIOGRAM;  Surgeon: Alleen Borne, MD;  Location: [facility] OR;  Service: Open Heart Surgery;  Laterality: N/A;   LAPAROSCOPIC GASTRIC BANDING  2009   LEFT HEART CATHETERIZATION WITH CORONARY ANGIOGRAM N/A 10/30/2013   Procedure: LEFT HEART CATHETERIZATION WITH CORONARY ANGIOGRAM;  Surgeon: Rollene Rotunda, MD;  Location: [facility] CATH LAB;  Service: Cardiovascular;  Laterality: N/A;   Left index finger repair     LUMBAR FUSION     POLYPECTOMY  04/29/2020   Procedure: POLYPECTOMY;  Surgeon: Corbin Ade, MD;  Location:  [facility] ENDO SUITE;  Service: Endoscopy;;   SPINE SURGERY     VESICOVAGINAL FISTULA CLOSURE W/ TAH       Home Medications:  Prior to Admission medications   Medication Sig Start Date End Date Taking? Authorizing Provider  alendronate (FOSAMAX) 70 MG tablet TAKE 1 TABLET(70 MG) BY MOUTH EVERY 7 DAYS WITH A FULL GLASS OF WATER AND ON AN EMPTY STOMACH 05/19/23  Yes Del Newman Nip, Tenna Child, FNP  DULoxetine (CYMBALTA) 60 MG capsule Take 1 capsule (60 mg total) by mouth 2 (two) times daily. 05/11/23  Yes Kerri Perches, MD  ezetimibe (ZETIA) 10 MG tablet TAKE 1 TABLET(10 MG) BY MOUTH DAILY 10/06/22  Yes Kerri Perches, MD  famotidine (PEPCID) 20 MG tablet TAKE 1 TABLET(20 MG) BY MOUTH TWICE DAILY 08/16/23  Yes Kerri Perches, MD  fenofibrate (TRICOR) 145 MG tablet TAKE 1 TABLET BY MOUTH EVERY DAY 02/01/23  Yes Lodema Hong,  Milus Mallick, MD  isosorbide mononitrate (IMDUR) 30 MG 24 hr tablet TAKE 1 TABLET(30 MG) BY MOUTH AT BEDTIME 11/26/22  Yes Hammock, Sheri, NP  levothyroxine (SYNTHROID) 125 MCG tablet Take 1 tablet (125 mcg total) by mouth daily. 05/06/23  Yes Kerri Perches, MD  losartan (COZAAR) 100 MG tablet Take 1 tablet (100 mg total) by mouth daily. 05/11/23  Yes Kerri Perches, MD  metoprolol succinate (TOPROL-XL) 50 MG 24 hr tablet Take 50 mg (1 tablet) every morning and 25 mg (1/2 tablet) at bedtime 06/15/23  Yes Dyann Kief, PA-C  oxyCODONE-acetaminophen (PERCOCET/ROXICET) 5-325 MG tablet Take 1 tablet by mouth every 6 (six) hours as needed for severe pain. 06/30/23  Yes Bethann Berkshire, MD  rosuvastatin (CRESTOR) 40 MG tablet TAKE 1 TABLET(40 MG) BY MOUTH DAILY 05/07/23  Yes Kerri Perches, MD  nitroGLYCERIN (NITROSTAT) 0.4 MG SL tablet Place 1 tablet (0.4 mg total) under the tongue every 5 (five) minutes as needed for chest pain. 11/26/22   Charlsie Quest, NP    Inpatient Medications: Scheduled Meds:  DULoxetine  60 mg Oral BID   enoxaparin (LOVENOX) injection  40 mg  Subcutaneous Q24H   [START ON 09/09/2023] ezetimibe  10 mg Oral Daily   famotidine  20 mg Oral Daily   feeding supplement  237 mL Oral TID BM   fenofibrate  160 mg Oral Daily   guaiFENesin  600 mg Oral BID   isosorbide mononitrate  30 mg Oral QHS   levothyroxine  125 mcg Oral Q0600   loratadine  10 mg Oral Daily   losartan  100 mg Oral Daily   metoprolol succinate  25 mg Oral QHS   metoprolol succinate  50 mg Oral Daily   multivitamin with minerals  1 tablet Oral Daily   OLANZapine  2.5 mg Oral Daily   rosuvastatin  40 mg Oral Daily   Continuous Infusions:  cefTRIAXone (ROCEPHIN)  IV 2 g (09/07/23 1509)   PRN Meds: albuterol, guaiFENesin-dextromethorphan, haloperidol lactate, hydrALAZINE, hydrOXYzine, nitroGLYCERIN, ondansetron (ZOFRAN) IV **OR** ondansetron, senna-docusate  Allergies:    Allergies  Allergen Reactions   Gabapentin Other (See Comments)    Feels dizzy   Meperidine Hcl Nausea And Vomiting   Niacin Other (See Comments)    REACTION: face peeling and burning   Propoxyphene N-Acetaminophen Nausea And Vomiting   Hydrocodone-Acetaminophen Nausea And Vomiting    Social History:   Social History   Tobacco Use   Smoking status: Every Day    Current packs/day: 0.25    Average packs/day: 0.3 packs/day for 50.0 years (12.5 ttl pk-yrs)    Types: Cigarettes   Smokeless tobacco: Never   Tobacco comments:    smokes about 3 a day   Substance Use Topics   Alcohol use: No    Alcohol/week: 0.0 standard drinks of alcohol     Family History:    Family History  Problem Relation Age of Onset   Heart attack Mother    Heart attack Father    Hypertension Sister    Heart attack Sister    Cancer Brother        Renal cell    Hepatitis Daughter    Drug abuse Daughter    Hypertension Daughter    Colon cancer Neg Hx      ROS:  Please see the history of present illness.  All other ROS reviewed and negative.     Physical Exam/Data:   Vitals:   09/07/23 1149  09/07/23 1247  09/07/23 1454 09/08/23 0551  BP: (!) 171/74  (!) 160/54 (!) 162/73  Pulse: (!) 59  (!) 55 (!) 54  Resp: 20  19 20   Temp: 98 F (36.7 C)  98.2 F (36.8 C) 98.7 F (37.1 C)  TempSrc: Oral     SpO2: 99% 98% 99% 97%  Weight:      Height:        Intake/Output Summary (Last 24 hours) at 09/08/2023 1216 Last data filed at 09/08/2023 0500 Gross per 24 hour  Intake 240 ml  Output 500 ml  Net -260 ml      09/04/2023   12:32 PM 06/30/2023    9:12 PM 05/24/2023    1:42 PM  Last 3 Weights  Weight (lbs) 199 lb 186 lb 15.2 oz 187 lb  Weight (kg) 90.266 kg 84.8 kg 84.823 kg     Body mass index is 34.16 kg/m.  General: Elderly female appearing in no acute distress.  HEENT: normal Neck: no JVD Vascular: No carotid bruits; Distal pulses 2+ bilaterally Cardiac:  normal S1, S2; RRR; no murmur  Lungs: Decreased breath sounds along lung bases but examination limited as listening anteriorly. Abd: soft, nontender, no hepatomegaly  Ext: no pitting edema Musculoskeletal:  No deformities, BUE and BLE strength normal and equal Skin: warm and dry  Neuro:  CNs 2-12 intact, no focal abnormalities noted Psych: A&Ox1.   EKG:  The EKG was personally reviewed and demonstrates: Significant artifact but appears most consistent with sinus bradycardia, heart rate with anterior infarct pattern. Telemetry:  Telemetry was personally reviewed and demonstrates: Not currently connected to telemetry.   Relevant CV Studies:  CTA: 02/2023 1. Negative examination for pulmonary embolism.  2. Paramedian suprahilar masslike consolidation of the right upper  lobe measuring 4.0 x 2.5 cm, new compared to prior examination.  Extensive heterogeneous and consolidative airspace opacity in the  right upper lobe peripheral to this lesion, likely postobstructive.  3. Multiple newly enlarged pretracheal, right hilar, and subcarinal  lymph nodes.  4. Constellation of findings is highly concerning for primary  lung  malignancy.  5. Coronary artery disease.   Echocardiogram: 02/2023 Summary   1. The left ventricle is normal in size with upper normal wall thickness.    2. The left ventricular systolic function is normal, LVEF is visually  estimated at > 55%.    3. The mitral valve leaflets are mildly thickened with normal leaflet  mobility.   4. There is mild to moderate mitral valve regurgitation.    5. The left atrium is moderately dilated in size.    6. The right ventricle is normal in size, with normal systolic function.    7. There is mild to moderate tricuspid regurgitation.   Laboratory Data:  High Sensitivity Troponin:   Recent Labs  Lab 09/04/23 1305 09/04/23 1506 09/07/23 1241 09/07/23 1428  TROPONINIHS 24* 23* 75* 76*     Chemistry Recent Labs  Lab 09/05/23 0416 09/06/23 0448 09/08/23 0447  NA 137 136 136  K 3.7 3.6 3.6  CL 102 103 104  CO2 27 25 25   GLUCOSE 109* 137* 114*  BUN 28* 25* 24*  CREATININE 1.60* 1.39* 1.47*  CALCIUM 8.8* 8.7* 9.1  MG 1.4* 1.4* 1.6*  GFRNONAA 33* 39* 37*  ANIONGAP 8 8 7     Recent Labs  Lab 09/04/23 1305  PROT 6.1*  ALBUMIN 2.5*  AST 24  ALT 11  ALKPHOS 84  BILITOT 1.0   Hematology Recent Labs  Lab  09/05/23 0416 09/05/23 1736 09/06/23 0448 09/08/23 0447  WBC 5.1  --  5.4 4.5  RBC 2.56* 2.74* 2.68* 2.86*  HGB 8.6*  --  8.7* 9.4*  HCT 26.5*  --  28.0* 30.0*  MCV 103.5*  --  104.5* 104.9*  MCH 33.6  --  32.5 32.9  MCHC 32.5  --  31.1 31.3  RDW 14.9  --  15.0 15.4  PLT 112*  --  115* 112*   Thyroid  Recent Labs  Lab 09/04/23 1305 09/04/23 1324  TSH 2.599  --   FREET4  --  1.64*    BNP Recent Labs  Lab 09/04/23 1305  BNP 601.0*     Radiology/Studies:  DG Chest Port 1 View  Result Date: 09/04/2023 CLINICAL DATA:  77 year old female with history of cough. EXAM: PORTABLE CHEST 1 VIEW COMPARISON:  Chest x-ray 02/23/2023. FINDINGS: Lung volumes are low. Opacity at the left base new compared to the prior  study, which may reflect atelectasis and/or consolidation, with superimposed small left pleural effusion. No right pleural effusion. No pneumothorax. No pulmonary nodule or mass noted. Pulmonary vasculature and the cardiomediastinal silhouette are within normal limits. Atherosclerosis in the thoracic aorta. Status post median sternotomy for CABG. IMPRESSION: 1. New area of atelectasis and/or consolidation in the left lung base with superimposed small left pleural effusion. Followup PA and lateral chest X-ray is recommended in 3-4 weeks following trial of antibiotic therapy to ensure resolution and exclude underlying malignancy. 2. Aortic atherosclerosis. Electronically Signed   By: Trudie Reed M.D.   On: 09/04/2023 14:54     Assessment and Plan:   1. Chest Pain/Elevated Troponin Values - By review of the chart, the patient had an episode of chest pain yesterday which led to repeat troponin values being obtained and found to be mildly elevated at 75 and 76. She is unable to elaborate on the symptoms from yesterday but denies any current symptoms. Possibly pleuritic in the setting of current community-acquired pneumonia but again she is unable to elaborate on what she felt at that time. Echocardiogram is pending to assess for any structural abnormalities (EF was greater than 55% by echocardiogram in 02/2023). At this time, would not anticipate further ischemic testing given her current mental state and multiple medical issues.  2. CAD - She is s/p CABG in 10/2013 with LIMA-LAD, SVG-OM and SVG-PDA. Most recent ischemic evaluation was a low-risk NST in 2019.  Repeat echocardiogram pending. - It is unclear why she is not on ASA but would anticipate resuming ASA 81 mg daily at discharge. Continue Toprol-XL 75 mg daily, Losartan 100 mg daily, Zetia 10 mg daily and Crestor 40 mg daily.  3. Concerns for Atrial Fibrillation - Rounding notes mention atrial fibrillation but her EKG this admission showed  baseline artifact and she did have P waves noted in different leads which is most consistent with sinus bradycardia. Not currently on telemetry but monitor in 05/2023 showed predominantly normal sinus rhythm with brief episodes of SVT and frequent PAC's but no significant arrhythmias. No indication for anticoagulation at this time given no definitive atrial fibrillation/flutter.  4. HTN - BP was at 162/73 on most recent check but this was prior to receiving her morning medications. Would follow with this.   5.  Stage III CKD - Creatinine has been variable this year but peaked at 1.60 this admission and improved to 1.47 today. Would hold additional IV Lasix given minimal PO intake at this time.  6. AMS - Felt  to be secondary to hospital delirium and she has been started on Zyprexa. Animator present. Management per the hospitalist team.  7. Abnormal Chest CT - Prior Chest CTA in 02/2023 showed a paramedian suprahilar masslike consolidation of the right upper  lobe measuring 4.0 x 2.5 cm and concerning for primary lung malignancy. I cannot see where follow-up imaging has been obtained in the interim and she previously no showed for follow-up visits with Cardiology. Will make the admitting team aware as she would likely benefit from a repeat Chest CT this admission.   For questions or updates, please contact [facility] Please consult [email or web address] for contact info under    Signed, Ellsworth Lennox, PA-C  09/08/2023 12:16 PM   Attending note:  Patient seen and examined.  I reviewed her records and discussed the case with Ms. Patrick Jupiter, I agree with her above findings.  Cardiology consulted given reported chest pain that occurred yesterday.  She is currently admitted for evaluation of weakness, cough and chest congestion.  She is being treated for community-acquired pneumonia, also found to have a small left pleural effusion.  Experiencing AMS during her stay as well.  She  has a Cabin crew at the bedside.  Does not provide much history at all, falls asleep quickly after being awakened.  On examination she is noted to be afebrile, heart rate in the 50s, blood pressure 162/73.  Currently not on telemetry.  Lungs exhibit decreased breath sounds with scattered rhonchi particular in the left.  Cardiac exam with RRR and 1/6 systolic murmur.  No pitting edema.  Pertinent lab work includes potassium 3.6, creatinine 1.47, high-sensitivity troponin I levels initially in the 20s and more recently in the 70s, hemoglobin 9.4, platelets 112, TSH 2.599.  ECG incorrectly reported as atrial fibrillation, actually shows sinus bradycardia with lead motion artifact, leftward axis, LVH, nonspecific ST changes.  Chest x-ray reports atelectasis/consolidation in the left lung base with small pleural effusion.  Recent chest discomfort, patient not able to provide detailed history at this time.  ECG is nonspecific and high-sensitivity troponin I levels are not diagnostic of ACS.  Echocardiogram has been ordered to assess LVEF.  She is currently being treated for suspected community-acquired pneumonia.  She had an abnormal chest CT in April suggesting possibility of primary lung malignancy, would suggest a follow-up CT examination as this has not yet been pursued as an outpatient.  At this point do no anticipate further ischemic testing.  Jonelle Sidle, M.D., F.A.C.C.

## 2023-09-08 NOTE — Progress Notes (Signed)
   Progress Note  Patient Name: Tammy Suarez Date of Encounter: 09/08/2023  Primary Cardiologist: Nona Dell, MD  Echocardiogram today shows LVEF 55 to 60% with no regional wall motion abnormalities.  Mildly elevated estimated RVSP and moderate mitral regurgitation also noted.  At this point no further ischemic testing is planned as per consultation note from this morning.  Would continue to investigate pulmonary status with follow-up chest CT per primary team.  We will sign off for now.  For questions or updates, please contact [facility] Please consult [email or web address] for contact info under   Signed, Nona Dell, MD  09/08/2023, 4:18 PM

## 2023-09-08 NOTE — Hospital Course (Addendum)
77yo with h/o PAF, HLD, and hypothyroidism who presented on 10/19 with cough and generalized weakness, found to have LLL PNA.  She was started on Ceftriaxone and Azithromycin.  Despite treatment, she has had worsening delirium.   Resumed opiates, better intermittently.  Palliative care involved, GOC conversation led to plan for home with hospice on 10/28.

## 2023-09-08 NOTE — Plan of Care (Signed)
Tele sitter placed at bedside. Pt received ativan on day shift. Pt drowsy and slept during overnight. Meds held due to drowsiness and not safe to administer.  Problem: Safety: Goal: Ability to remain free from injury will improve Outcome: Progressing

## 2023-09-09 DIAGNOSIS — J189 Pneumonia, unspecified organism: Secondary | ICD-10-CM | POA: Diagnosis not present

## 2023-09-09 LAB — CBC WITH DIFFERENTIAL/PLATELET
Abs Immature Granulocytes: 0.04 10*3/uL (ref 0.00–0.07)
Basophils Absolute: 0 10*3/uL (ref 0.0–0.1)
Basophils Relative: 0 %
Eosinophils Absolute: 0 10*3/uL (ref 0.0–0.5)
Eosinophils Relative: 0 %
HCT: 30.7 % — ABNORMAL LOW (ref 36.0–46.0)
Hemoglobin: 9.3 g/dL — ABNORMAL LOW (ref 12.0–15.0)
Immature Granulocytes: 1 %
Lymphocytes Relative: 22 %
Lymphs Abs: 1.9 10*3/uL (ref 0.7–4.0)
MCH: 31.7 pg (ref 26.0–34.0)
MCHC: 30.3 g/dL (ref 30.0–36.0)
MCV: 104.8 fL — ABNORMAL HIGH (ref 80.0–100.0)
Monocytes Absolute: 1.5 10*3/uL — ABNORMAL HIGH (ref 0.1–1.0)
Monocytes Relative: 18 %
Neutro Abs: 5.1 10*3/uL (ref 1.7–7.7)
Neutrophils Relative %: 59 %
Platelets: 121 10*3/uL — ABNORMAL LOW (ref 150–400)
RBC: 2.93 MIL/uL — ABNORMAL LOW (ref 3.87–5.11)
RDW: 15.3 % (ref 11.5–15.5)
WBC: 8.6 10*3/uL (ref 4.0–10.5)
nRBC: 0 % (ref 0.0–0.2)

## 2023-09-09 LAB — BASIC METABOLIC PANEL
Anion gap: 7 (ref 5–15)
BUN: 23 mg/dL (ref 8–23)
CO2: 26 mmol/L (ref 22–32)
Calcium: 8.7 mg/dL — ABNORMAL LOW (ref 8.9–10.3)
Chloride: 100 mmol/L (ref 98–111)
Creatinine, Ser: 1.43 mg/dL — ABNORMAL HIGH (ref 0.44–1.00)
GFR, Estimated: 38 mL/min — ABNORMAL LOW (ref >60)
Glucose, Bld: 146 mg/dL — ABNORMAL HIGH (ref 70–99)
Potassium: 3.6 mmol/L (ref 3.5–5.1)
Sodium: 133 mmol/L — ABNORMAL LOW (ref 135–145)

## 2023-09-09 LAB — MAGNESIUM: Magnesium: 1.4 mg/dL — ABNORMAL LOW (ref 1.7–2.4)

## 2023-09-09 NOTE — TOC Progression Note (Signed)
Transition of Care [facility]) - Progression Note    Patient Details  Name: Tammy Suarez MRN: [id number] Date of Birth: 1946/07/08  Transition of Care [facility]) CM/SW Contact  Leitha Bleak, RN Phone Number: 09/09/2023, 12:04 PM  Clinical Narrative:   PT is now recommending SNF. CM spoke with her daughter, She is agreeable, she can not handle her at home, she needs rehab to get stronger. CMS provider options and Medicare ratings reviewed with Daughter.   [facility] home.  FL2 completed and sent out for bed offers and will call back to give choices.  TOC following to start INS AUTH.   Expected Discharge Plan: Skilled Nursing Facility Barriers to Discharge: Insurance Authorization, No SNF bed  Expected Discharge Plan and Services     Post Acute Care Choice: Home Health      HH Arranged: PT, RN, OT [facility] Agency: [facility] Date [facility] Agency Contacted: 09/07/23   Representative spoke with at [facility] Agency: Clydie Braun   Social Determinants of Health (SDOH) Interventions SDOH Screenings   Food Insecurity: No Food Insecurity (09/04/2023)  Housing: Low Risk  (09/04/2023)  Transportation Needs: No Transportation Needs (09/04/2023)  Utilities: Not At Risk (09/04/2023)  Alcohol Screen: Low Risk  (04/22/2023)  Depression (PHQ2-9): High Risk (05/05/2023)  Financial Resource Strain: Low Risk  (04/22/2023)  Recent Concern: Financial Resource Strain - Medium Risk (02/24/2023)   Received from [facility], [facility]  Physical Activity: Inactive (04/22/2023)  Social Connections: Socially Isolated (04/22/2023)  Stress: No Stress Concern Present (04/22/2023)  Tobacco Use: High Risk (09/04/2023)    Readmission Risk Interventions    09/09/2023   11:24 AM 09/06/2023    1:16 PM  Readmission Risk Prevention Plan  Transportation Screening Complete Complete  PCP or Specialist Appt within 5-7 Days Complete Not Complete  Home Care Screening  Complete  Medication Review (RN CM)   Complete

## 2023-09-09 NOTE — Plan of Care (Signed)

## 2023-09-09 NOTE — [place] FL2 (Signed)
[place] MEDICAID FL2 LEVEL OF CARE FORM     IDENTIFICATION  Patient Name: Tammy Suarez Birthdate: 09/01/1946 Sex: female Admission Date (Current Location): 09/04/2023  [facility] and [place] Number:  [employer] and Address:  [facility],  [address] [place] [address]      Provider Number: [id number]  Attending Physician Name and Address:  Jonah Blue, MD  Relative Name and Phone Number:       Current Level of Care: Hospital Recommended Level of Care: Skilled Nursing Facility Prior Approval Number:    Date Approved/Denied:   PASRR Number: [id number]  Discharge Plan: SNF    Current Diagnoses: Patient Active Problem List   Diagnosis Date Noted   Malnutrition of moderate degree 09/07/2023   Community acquired pneumonia 09/04/2023   Depression, major, single episode, severe (HCC) 05/11/2023   Dermatitis 05/11/2023   Requires assistance with activities of daily living (ADL) 05/11/2023   Dysuria 16/11/930   Chronic pain 12/07/2022   Vitamin D deficiency 11/22/2022   Osteoarthritis 01/29/2021   Right shoulder pain 09/18/2020   Positive colorectal cancer screening using Cologuard test 02/12/2020   Constipation 02/12/2020   Closed fracture of proximal end of right humerus 04/29/19 05/15/2019   Fracture of right shoulder 05/02/2019   Carpal tunnel syndrome, left    Benign mole 10/02/2015   Left carpal tunnel syndrome 12/04/2014   Coronary atherosclerosis of native coronary artery 11/02/2013   CKD (chronic kidney disease) stage 3, GFR 30-59 ml/min (HCC)    DM (diabetes mellitus), type 2 with renal complications (HCC) 09/15/2013   Recurrent falls 07/28/2013   Urinary incontinence 03/23/2013   Nicotine dependence 11/24/2012   Hypothyroidism 09/28/2010   Depression with anxiety 09/28/2010   FATIGUE 02/08/2009   VITAMIN B12 DEFICIENCY 12/09/2007   Hyperlipidemia with target LDL less than 70 12/09/2007   Obesity (BMI 30.0-34.9)  12/09/2007   Essential hypertension, benign 12/09/2007   GERD 12/09/2007   Spinal stenosis of lumbar region with radiculopathy 12/09/2007    Orientation RESPIRATION BLADDER Height & Weight     Self, Time, Place  Normal Incontinent, External catheter Weight: 90.3 kg Height:  5\' 4"  (162.6 cm)  BEHAVIORAL SYMPTOMS/MOOD NEUROLOGICAL BOWEL NUTRITION STATUS      Continent Diet (See D/C summary)  AMBULATORY STATUS COMMUNICATION OF NEEDS Skin   Extensive Assist Verbally Normal                       Personal Care Assistance Level of Assistance  Bathing, Feeding, Dressing Bathing Assistance: Limited assistance Feeding assistance: Independent Dressing Assistance: Limited assistance     Functional Limitations Info  Sight, Hearing, Speech Sight Info: Adequate Hearing Info: Impaired Speech Info: Adequate    SPECIAL CARE FACTORS FREQUENCY  PT (By licensed PT), OT (By licensed OT)     PT Frequency: 5 times weekly OT Frequency: 5 times weekly            Contractures Contractures Info: Not present    Additional Factors Info  Code Status, Allergies Code Status Info: DNR-Comfort Allergies Info: Gabapentin, Meperidine Hcl, Niacin, Propoxyphene N-acetaminophen, Hydrocodone-acetaminophen           Current Medications (09/09/2023):  This is the current hospital active medication list Current Facility-Administered Medications  Medication Dose Route Frequency Provider Last Rate Last Admin   albuterol (PROVENTIL) (2.5 MG/3ML) 0.083% nebulizer solution 3 mL  3 mL Inhalation Q4H PRN Steffanie Rainwater, MD   3 mL at 09/07/23 1247  DULoxetine (CYMBALTA) DR capsule 60 mg  60 mg Oral BID Kathlen Mody, MD   60 mg at 09/09/23 0839   enoxaparin (LOVENOX) injection 40 mg  40 mg Subcutaneous Q24H Steffanie Rainwater, MD   40 mg at 09/08/23 2120   ezetimibe (ZETIA) tablet 10 mg  10 mg Oral Daily Randall An M, PA-C   10 mg at 09/09/23 0840   famotidine (PEPCID) tablet 20 mg  20 mg  Oral Daily Kathlen Mody, MD   20 mg at 09/09/23 0840   feeding supplement (ENSURE ENLIVE / ENSURE PLUS) liquid 237 mL  237 mL Oral TID BM Kathlen Mody, MD   237 mL at 09/08/23 0943   fenofibrate tablet 160 mg  160 mg Oral Daily Kathlen Mody, MD   160 mg at 09/09/23 0839   guaiFENesin (MUCINEX) 12 hr tablet 600 mg  600 mg Oral BID Steffanie Rainwater, MD   600 mg at 09/09/23 0840   guaiFENesin-dextromethorphan (ROBITUSSIN DM) 100-10 MG/5ML syrup 10 mL  10 mL Oral Q4H PRN Kathlen Mody, MD       haloperidol lactate (HALDOL) injection 2 mg  2 mg Intravenous Q6H PRN Kathlen Mody, MD   2 mg at 09/07/23 1536   hydrALAZINE (APRESOLINE) tablet 25 mg  25 mg Oral Q8H PRN Kathlen Mody, MD   25 mg at 09/05/23 1738   hydrOXYzine (ATARAX) tablet 25 mg  25 mg Oral TID PRN Kathlen Mody, MD   25 mg at 09/07/23 1535   isosorbide mononitrate (IMDUR) 24 hr tablet 30 mg  30 mg Oral QHS Steffanie Rainwater, MD   30 mg at 09/08/23 2120   levothyroxine (SYNTHROID) tablet 125 mcg  125 mcg Oral Q0600 Steffanie Rainwater, MD   125 mcg at 09/09/23 0540   loratadine (CLARITIN) tablet 10 mg  10 mg Oral Daily Kathlen Mody, MD   10 mg at 09/09/23 0839   losartan (COZAAR) tablet 100 mg  100 mg Oral Daily Steffanie Rainwater, MD   100 mg at 09/09/23 0839   magnesium oxide (MAG-OX) tablet 400 mg  400 mg Oral Daily Jonah Blue, MD   400 mg at 09/09/23 0840   metoprolol succinate (TOPROL-XL) 24 hr tablet 25 mg  25 mg Oral QHS Hunt, Madison H, RPH   25 mg at 09/08/23 2120   metoprolol succinate (TOPROL-XL) 24 hr tablet 50 mg  50 mg Oral Daily Paulita Fujita H, RPH   50 mg at 09/09/23 3474   multivitamin with minerals tablet 1 tablet  1 tablet Oral Daily Kathlen Mody, MD   1 tablet at 09/09/23 0839   nitroGLYCERIN (NITROSTAT) SL tablet 0.4 mg  0.4 mg Sublingual Q5 min PRN Kathlen Mody, MD   0.4 mg at 09/07/23 1200   OLANZapine (ZYPREXA) tablet 2.5 mg  2.5 mg Oral Daily Kathlen Mody, MD   2.5 mg at 09/09/23 0839    ondansetron (ZOFRAN) injection 4 mg  4 mg Intravenous Q8H PRN Steffanie Rainwater, MD       Or   ondansetron (ZOFRAN) tablet 4 mg  4 mg Oral Q8H PRN Steffanie Rainwater, MD       rosuvastatin (CRESTOR) tablet 40 mg  40 mg Oral Daily Kathlen Mody, MD   40 mg at 09/09/23 0839   senna-docusate (Senokot-S) tablet 2 tablet  2 tablet Oral QHS PRN Kathlen Mody, MD         Discharge Medications: Please see discharge summary for a list of discharge medications.  Relevant Imaging Results:  Relevant Lab Results:   Additional Information SSN [id number]  Leitha Bleak, RN

## 2023-09-09 NOTE — Progress Notes (Signed)
Physical Therapy Treatment Patient Details Name: Tammy Suarez MRN: [id number] DOB: 1946-05-13 Today's Date: 09/09/2023   History of Present Illness Tammy Suarez is a 77 y.o. female with medical history significant of A-fib, CHF, CKD, GERD, HLD, and hypothyroidism who presented to the ED for evaluation of generalized weakness and cough.  Per patient and family patient has not felt well for over 3 weeks.  Over the last 4 to 5 days, she had developed cough and cold symptoms.  She has had a productive cough with yellowish/greenish sputum.  Per family, patient has slid down her bed multiple times due to generalized weakness.  She reports associated nasal congestion and occasional shortness of breath but denies any fevers, chills, chest pain, abdominal pain or headaches.    PT Comments  Patient present seated in chair and agreeable for therapy.  Patient demonstrates slow labored movement for getting into/out of bed requiring Min/mod assist due to weakness, very unsteady on feet and limited to a few side steps before having to sit due toc/o fatigue, weakness and back pain.  Patient tolerated staying up in chair after therapy.  Patient will benefit from continued skilled physical therapy in hospital and recommended venue below to increase strength, balance, endurance for safe ADLs and gait.      If plan is discharge home, recommend the following: A lot of help with walking and/or transfers;Help with stairs or ramp for entrance;A lot of help with bathing/dressing/bathroom;Assistance with cooking/housework   Can travel by private vehicle     No  Equipment Recommendations  Rolling walker (2 wheels)    Recommendations for Other Services       Precautions / Restrictions Precautions Precautions: Fall Restrictions Weight Bearing Restrictions: No     Mobility  Bed Mobility Overal bed mobility: Needs Assistance Bed Mobility: Supine to Sit, Sit to Supine     Supine to sit: Min assist, Mod  assist Sit to supine: Min assist   General bed mobility comments: increased time, labored movement    Transfers Overall transfer level: Needs assistance Equipment used: Rolling walker (2 wheels) Transfers: Sit to/from Stand, Bed to chair/wheelchair/BSC Sit to Stand: Min assist, Mod assist   Step pivot transfers: Min assist, Mod assist       General transfer comment: unsteady labored movement    Ambulation/Gait Ambulation/Gait assistance: Mod assist Gait Distance (Feet): 5 Feet Assistive device: Rolling walker (2 wheels) Gait Pattern/deviations: Decreased step length - left, Decreased stance time - right, Decreased stride length Gait velocity: slow     General Gait Details: limited to a few slow labored side steps at bedside due to fatigue, poor standing balance and weakness   Stairs             Wheelchair Mobility     Tilt Bed    Modified Rankin (Stroke Patients Only)       Balance Overall balance assessment: Needs assistance Sitting-balance support: Feet supported, No upper extremity supported Sitting balance-Leahy Scale: Fair Sitting balance - Comments: fair/good seated at EOB   Standing balance support: Reliant on assistive device for balance, During functional activity, Bilateral upper extremity supported Standing balance-Leahy Scale: Poor Standing balance comment: fair/poor using RW                            Cognition Arousal: Alert Behavior During Therapy: WFL for tasks assessed/performed Overall Cognitive Status: History of cognitive impairments - at baseline  Exercises General Exercises - Lower Extremity Long Arc Quad: Seated, AROM, Strengthening, Both, 10 reps Hip Flexion/Marching: Seated, AROM, Strengthening, Both, 10 reps Toe Raises: Seated, AROM, Strengthening, Both, 10 reps Heel Raises: Seated, AROM, Strengthening, Both, 10 reps    General Comments         Pertinent Vitals/Pain Pain Assessment Pain Assessment: Faces Faces Pain Scale: Hurts little more Pain Location: neck Pain Descriptors / Indicators: Sore, Grimacing, Discomfort Pain Intervention(s): Limited activity within patient's tolerance, Monitored during session, Repositioned    Home Living Family/patient expects to be discharged to:: Private residence Living Arrangements: Children Available Help at Discharge: Family;Available PRN/intermittently Type of Home: House Home Access: Ramped entrance       Home Layout: One level Home Equipment: Agricultural consultant (2 wheels);Shower seat;Grab bars - tub/shower;BSC/3in1 Additional Comments: per chart review    Prior Function            PT Goals (current goals can now be found in the care plan section) Acute Rehab PT Goals Patient Stated Goal: return home with family to assist PT Goal Formulation: With patient Time For Goal Achievement: 09/23/23 Potential to Achieve Goals: Good Progress towards PT goals: Progressing toward goals    Frequency    Min 3X/week      PT Plan      Co-evaluation              AM-PAC PT "6 Clicks" Mobility   Outcome Measure  Help needed turning from your back to your side while in a flat bed without using bedrails?: A Little Help needed moving from lying on your back to sitting on the side of a flat bed without using bedrails?: A Lot Help needed moving to and from a bed to a chair (including a wheelchair)?: A Lot Help needed standing up from a chair using your arms (e.g., wheelchair or bedside chair)?: A Lot Help needed to walk in hospital room?: A Lot Help needed climbing 3-5 steps with a railing? : A Lot 6 Click Score: 13    End of Session   Activity Tolerance: Patient tolerated treatment well;Patient limited by fatigue Patient left: in chair;with call bell/phone within reach;with chair alarm set Nurse Communication: Mobility status PT Visit Diagnosis: Unsteadiness on feet  (R26.81);Other abnormalities of gait and mobility (R26.89);Muscle weakness (generalized) (M62.81)     Time: 7846-9629 PT Time Calculation (min) (ACUTE ONLY): 21 min  Charges:    $Therapeutic Exercise: 8-22 mins $Therapeutic Activity: 8-22 mins PT General Charges $$ ACUTE PT VISIT: 1 Visit                     9:55 AM, 09/09/23 Ocie Bob, MPT Physical Therapist with [facility] [phone] office [phone] mobile phone

## 2023-09-09 NOTE — Evaluation (Signed)
Occupational Therapy Evaluation Patient Details Name: Tammy Suarez MRN: [id number] DOB: 27-Jan-1946 Today's Date: 09/09/2023   History of Present Illness Tammy Suarez is a 77 y.o. female with medical history significant of A-fib, CHF, CKD, GERD, HLD, and hypothyroidism who presented to the ED for evaluation of generalized weakness and cough.  Per patient and family patient has not felt well for over 3 weeks.  Over the last 4 to 5 days, she had developed cough and cold symptoms.  She has had a productive cough with yellowish/greenish sputum.  Per family, patient has slid down her bed multiple times due to generalized weakness.  She reports associated nasal congestion and occasional shortness of breath but denies any fevers, chills, chest pain, abdominal pain or headaches.   Clinical Impression   Pt agreeable to OT evaluation. Pt seemed slightly confused and not oriented to place. Pt required min to mod A for bed mobility and CGA to min A for step pivot to the chair. Pt reported she was  too fatigued to attempt further ambulation. Pt required min A for donning socks and demonstrated limited bilateral shoulder A/ROM.  Pt also was mildly unsafe with transfer via sitting before being fully back to the chair. No family present during evaluation. Pt left in the chair with chair alarm set and nursing present. Pt will benefit from continued OT in the hospital and recommended venue below to increase strength, balance, and endurance for safe ADL's.         If plan is discharge home, recommend the following: A little help with walking and/or transfers;A little help with bathing/dressing/bathroom;Assistance with cooking/housework;Assist for transportation;Direct supervision/assist for medications management;Help with stairs or ramp for entrance    Functional Status Assessment  Patient has had a recent decline in their functional status and demonstrates the ability to make significant improvements in function  in a reasonable and predictable amount of time.  Equipment Recommendations  None recommended by OT           Precautions / Restrictions Precautions Precautions: Fall Restrictions Weight Bearing Restrictions: No      Mobility Bed Mobility Overal bed mobility: Needs Assistance Bed Mobility: Supine to Sit     Supine to sit: Mod assist, Min assist     General bed mobility comments: Required assist to move B LE to EOB and pull to sit.    Transfers Overall transfer level: Needs assistance Equipment used: Rolling walker (2 wheels) Transfers: Sit to/from Stand, Bed to chair/wheelchair/BSC Sit to Stand: Contact guard assist     Step pivot transfers: Min assist, Contact guard assist     General transfer comment: slow labored movement; unsteady and a little unsafe via sitting before fully back at the chair due to fatigue.      Balance Overall balance assessment: Needs assistance Sitting-balance support: Feet supported, No upper extremity supported Sitting balance-Leahy Scale: Fair Sitting balance - Comments: seated at EOB   Standing balance support: Bilateral upper extremity supported, During functional activity, Reliant on assistive device for balance Standing balance-Leahy Scale: Fair Standing balance comment: poor to fair with RW                           ADL either performed or assessed with clinical judgement   ADL Overall ADL's : Needs assistance/impaired     Grooming: Moderate assistance;Sitting;Minimal assistance   Upper Body Bathing: Moderate assistance;Sitting;Minimal assistance   Lower Body Bathing: Minimal assistance;Moderate assistance;Sitting/lateral leans   Upper Body  Dressing : Minimal assistance;Moderate assistance;Sitting   Lower Body Dressing: Minimal assistance;Moderate assistance;Sitting/lateral leans Lower Body Dressing Details (indicate cue type and reason): Based on need for min A to don L sock seated at EOB. Much time and  labored effort. Toilet Transfer: Contact guard assist;Minimal assistance;Stand-pivot;Rolling walker (2 wheels) Toilet Transfer Details (indicate cue type and reason): Simulated via EOB to chair transfer Toileting- Clothing Manipulation and Hygiene: Contact guard assist;Minimal assistance;Sitting/lateral lean       Functional mobility during ADLs: Contact guard assist;Minimal assistance;Rolling walker (2 wheels)       Vision Baseline Vision/History: 1 Wears glasses Ability to See in Adequate Light: 1 Impaired Patient Visual Report: No change from baseline Vision Assessment?: No apparent visual deficits     Perception Perception: Not tested       Praxis Praxis: Not tested       Pertinent Vitals/Pain Pain Assessment Pain Assessment: 0-10 Pain Score: 8  Pain Location: neck Pain Descriptors / Indicators: Crushing Pain Intervention(s): Limited activity within patient's tolerance, Monitored during session, Repositioned     Extremity/Trunk Assessment Upper Extremity Assessment Upper Extremity Assessment:  (2+/5 bilaterally for shoulder flexion/abduction. Limited at baseline. Generally weak otherwise.)   Lower Extremity Assessment Lower Extremity Assessment: Defer to PT evaluation   Cervical / Trunk Assessment Cervical / Trunk Assessment: Kyphotic   Communication Communication Communication: No apparent difficulties;Other (comment) (tends to whisper when talking)   Cognition Arousal: Alert Behavior During Therapy: WFL for tasks assessed/performed Overall Cognitive Status: History of cognitive impairments - at baseline                                 General Comments: Pt not oriented to place. Oreiented to year and month. Pt seemed mildly confused.                      Home Living Family/patient expects to be discharged to:: Private residence Living Arrangements: Children Available Help at Discharge: Family;Available PRN/intermittently Type of  Home: House Home Access: Ramped entrance     Home Layout: One level     Bathroom Shower/Tub: Chief Strategy Officer: Handicapped height Bathroom Accessibility: Yes   Home Equipment: Agricultural consultant (2 wheels);Shower seat;Grab bars - tub/shower;BSC/3in1   Additional Comments: per chart review      Prior Functioning/Environment Prior Level of Function : Needs assist       Physical Assist : ADLs (physical)   ADLs (physical): IADLs Mobility Comments: household ambulator using RW (per chart review) ADLs Comments: Pt reports independence for ADLs with assist for getting groceries. This is per pt and not confirmed by family.        OT Problem List: Decreased cognition;Decreased strength;Decreased range of motion;Decreased activity tolerance;Impaired balance (sitting and/or standing);Decreased safety awareness      OT Treatment/Interventions: Self-care/ADL training;Therapeutic exercise;Therapeutic activities;Balance training;Patient/family education;Cognitive remediation/compensation    OT Goals(Current goals can be found in the care plan section) Acute Rehab OT Goals Patient Stated Goal: return home OT Goal Formulation: With patient Time For Goal Achievement: 09/23/23 Potential to Achieve Goals: Good  OT Frequency: Min 2X/week                                   End of Session Equipment Utilized During Treatment: Rolling walker (2 wheels);Gait belt Nurse Communication: Other (comment) (entered room at the end of  the session)  Activity Tolerance: Patient tolerated treatment well Patient left: in chair;with call bell/phone within reach;with chair alarm set  OT Visit Diagnosis: Unsteadiness on feet (R26.81);Other abnormalities of gait and mobility (R26.89);Muscle weakness (generalized) (M62.81);Other symptoms and signs involving cognitive function                Time: 0102-7253 OT Time Calculation (min): 16 min Charges:  OT General Charges $OT  Visit: 1 Visit OT Evaluation $OT Eval Low Complexity: 1 Low  Marialuisa Basara OT, MOT   Danie Chandler 09/09/2023, 9:53 AM

## 2023-09-09 NOTE — Plan of Care (Signed)
  Problem: Acute Rehab OT Goals (only OT should resolve) Goal: Pt. Will Perform Grooming Flowsheets (Taken 09/09/2023 0955) Pt Will Perform Grooming:  with modified independence  standing Goal: Pt. Will Perform Upper Body Dressing Flowsheets (Taken 09/09/2023 0955) Pt Will Perform Upper Body Dressing:  with modified independence  sitting Goal: Pt. Will Perform Lower Body Dressing Flowsheets (Taken 09/09/2023 0955) Pt Will Perform Lower Body Dressing:  with modified independence  sitting/lateral leans Goal: Pt. Will Transfer To Toilet Flowsheets (Taken 09/09/2023 [phone]) Pt Will Transfer to Toilet:  with modified independence  ambulating Goal: Pt. Will Perform Toileting-Clothing Manipulation Flowsheets (Taken 09/09/2023 0955) Pt Will Perform Toileting - Clothing Manipulation and hygiene:  with modified independence  sitting/lateral leans Goal: Pt/Caregiver Will Perform Home Exercise Program Flowsheets (Taken 09/09/2023 [phone]) Pt/caregiver will Perform Home Exercise Program:  Increased ROM  Increased strength  Both right and left upper extremity  With minimal assist  Tyyne Cliett OT, MOT

## 2023-09-09 NOTE — Progress Notes (Signed)
Progress Note   Patient: Tammy Suarez [id number]:[id number] DOB: April 30, 1946 DOA: 09/04/2023     5 DOS: the patient was seen and examined on 09/09/2023   Brief hospital course: 77yo with h/o PAF, HLD, and hypothyroidism who presented on 10/19 with cough and generalized weakness, found to have LLL PNA.  She was started on Ceftriaxone and Azithromycin.  Assessment and Plan:  CAP Started on IV rocephin and IV zithromax, continue with mucinex Improving symptoms, on room air Duonebs as needed Started on claritin for congestion Strep pneumo antigen is negative Improving with IV Antibiotics, will be complete after today's doses She did have an abnormal chest CT in 02/2023 at [facility] with findings "highly concerning for primary lung malignancy"; her daughter reports that she was unaware of this Repeat chest CT was reassuring and appears to show multifocal PNA without apparent malignancy   Dementia Recent onset, rapidly progressive according to her daughter - much worse in the last week or two, per daughter Hypersomnolent yesterday - possibly due to medications and sleep cycle disturbance  More aware today but quite confused with some apparent visual hallucinations Anxiety, with episodes of agitation while hospitalized, likely due to hospital delirium  Pt started on hydroxyzine and haldol without any improvement Added zyprexa She is recommended for SNF discharge at the time of dc; will order [facility] consult to assist - ideally the [facility] in [place]   Atrial fibrillation Rate controlled with metoprolol  Not on AC due to questionable diagnosis    Hypertension Continue Toprol XL, losartan Added hydralazine prn   Hyperlipidemia Continue rosuvastatin and Zetia   Stage 3b CKD Thought to be AKI on presentation but appears to be stable and at/near baseline Attempt to avoid nephrotoxic medications Recheck BMP in AM    Macrocytic anemia Appears to be stable   Thrombocytopenia Also  stable Recheck CBC in AM   Hypomagnesemia Repleted Recheck in am   Chest pain S/p CABG 2014 Reported chest pain yesterday, now apparently resolved With mildly elevated troponin of 75 and 76,  EKG shows non specific st t wave changes, and t wave inversions Prior echo in 02/2023 was unremarkable  Repeat echo is ordered and also unremarkable Cardiology consulted and is not planning ischemic evaluation Appears to need resumption of ASA 81 mg daily    Hypothyroidism Resume synthroid 125 mcg daily   Moderate malnutrition Nutrition Problem: Moderate Malnutrition Etiology: chronic illness (CHF) Signs/Symptoms: mild fat depletion, mild muscle depletion Interventions: Ensure Enlive (each supplement provides 350kcal and 20 grams of protein), MVI   Obesity Body mass index is 34.16 kg/m.Marland Kitchen  Weight loss should be encouraged Outpatient PCP/bariatric medicine/bariatric surgery f/u encouraged      *There was a notation about a stage 2 pressure ulcer on buttocks but this is not charted in nursing notes and does not appear to be present at this time     Consultants: PT/OT TOC team   Procedures: None   Antibiotics: Ceftriaxone 10/19-23 Azithromycin 10/19-23     30 Day Unplanned Readmission Risk Score    Flowsheet Row ED to Hosp-Admission (Current) from 09/04/2023 in [facility] MEDICAL SURGICAL UNIT  30 Day Unplanned Readmission Risk Score (%) 18.55 Filed at 09/09/2023 0801       This score is the patient's risk of an unplanned readmission within 30 days of being discharged (0 -100%). The score is based on dignosis, age, lab data, medications, orders, and past utilization.   Low:  0-14.9   Medium: 15-21.9   High: 22-29.9  Extreme: 30 and above           Subjective: Walked with therapy, sitting up in chair.  Still quite confused.  On RA.   Objective: Vitals:   09/09/23 0843 09/09/23 1301  BP:  133/85  Pulse: 88 (!) 58  Resp:  20  Temp:  97.8 F (36.6 C)  SpO2:   100%    Intake/Output Summary (Last 24 hours) at 09/09/2023 1552 Last data filed at 09/09/2023 0659 Gross per 24 hour  Intake 338.71 ml  Output 1500 ml  Net -1161.29 ml   Filed Weights   09/04/23 1232  Weight: 90.3 kg    Exam:  General:  Appears calm and comfortable and is in NAD, confused, on RA Eyes:  EOMI, normal lids, iris ENT:  grossly normal hearing, lips & tongue, mmm Neck:  no LAD, masses or thyromegaly Cardiovascular:  RRR, no m/r/g. No LE edema.  Respiratory:   CTA bilaterally with no wheezes/rales/rhonchi.  Normal respiratory effort. Abdomen:  soft, NT, ND Skin:  no rash or induration seen on limited exam Musculoskeletal:  grossly normal tone BUE/BLE, good ROM, no bony abnormality Psychiatric:  somnolent mood and affect, speech sparse but appropriate Neurologic:  CN 2-12 grossly intact, moves all extremities in coordinated fashion  Data Reviewed: I have reviewed the patient's lab results since admission.  Pertinent labs for today include:   Na++ 133 Glucose 146 BUN 23/Creatinine 1.43/GFR 38 - stable Mag++ 1.4 WBC 8.6 Hgb 9.3 - stable Platelets 121 - stable     Family Communication: None present; I spoke with her daughter by telephone  Disposition: Status is: Inpatient Remains inpatient appropriate because: unsafe disposition     Time spent: 35 minutes  Unresulted Labs (From admission, onward)    None        Author: Jonah Blue, MD 09/09/2023 3:52 PM  For on call review [email or web address].

## 2023-09-10 ENCOUNTER — Inpatient Hospital Stay (HOSPITAL_COMMUNITY): Payer: Medicare HMO

## 2023-09-10 DIAGNOSIS — Z7189 Other specified counseling: Secondary | ICD-10-CM | POA: Diagnosis not present

## 2023-09-10 DIAGNOSIS — J189 Pneumonia, unspecified organism: Secondary | ICD-10-CM | POA: Diagnosis not present

## 2023-09-10 DIAGNOSIS — Z515 Encounter for palliative care: Secondary | ICD-10-CM | POA: Diagnosis not present

## 2023-09-10 DIAGNOSIS — F03918 Unspecified dementia, unspecified severity, with other behavioral disturbance: Secondary | ICD-10-CM | POA: Diagnosis present

## 2023-09-10 LAB — BASIC METABOLIC PANEL
Anion gap: 11 (ref 5–15)
BUN: 23 mg/dL (ref 8–23)
CO2: 24 mmol/L (ref 22–32)
Calcium: 9.1 mg/dL (ref 8.9–10.3)
Chloride: 101 mmol/L (ref 98–111)
Creatinine, Ser: 1.38 mg/dL — ABNORMAL HIGH (ref 0.44–1.00)
GFR, Estimated: 39 mL/min — ABNORMAL LOW (ref >60)
Glucose, Bld: 128 mg/dL — ABNORMAL HIGH (ref 70–99)
Potassium: 3.6 mmol/L (ref 3.5–5.1)
Sodium: 136 mmol/L (ref 135–145)

## 2023-09-10 LAB — CBC WITH DIFFERENTIAL/PLATELET
Abs Immature Granulocytes: 0.03 10*3/uL (ref 0.00–0.07)
Basophils Absolute: 0 10*3/uL (ref 0.0–0.1)
Basophils Relative: 1 %
Eosinophils Absolute: 0 10*3/uL (ref 0.0–0.5)
Eosinophils Relative: 1 %
HCT: 31.3 % — ABNORMAL LOW (ref 36.0–46.0)
Hemoglobin: 10 g/dL — ABNORMAL LOW (ref 12.0–15.0)
Immature Granulocytes: 1 %
Lymphocytes Relative: 24 %
Lymphs Abs: 1.5 10*3/uL (ref 0.7–4.0)
MCH: 33.2 pg (ref 26.0–34.0)
MCHC: 31.9 g/dL (ref 30.0–36.0)
MCV: 104 fL — ABNORMAL HIGH (ref 80.0–100.0)
Monocytes Absolute: 1 10*3/uL (ref 0.1–1.0)
Monocytes Relative: 15 %
Neutro Abs: 3.8 10*3/uL (ref 1.7–7.7)
Neutrophils Relative %: 58 %
Platelets: 129 10*3/uL — ABNORMAL LOW (ref 150–400)
RBC: 3.01 MIL/uL — ABNORMAL LOW (ref 3.87–5.11)
RDW: 15.1 % (ref 11.5–15.5)
WBC: 6.4 10*3/uL (ref 4.0–10.5)
nRBC: 0 % (ref 0.0–0.2)

## 2023-09-10 LAB — MAGNESIUM: Magnesium: 1.4 mg/dL — ABNORMAL LOW (ref 1.7–2.4)

## 2023-09-10 MED ORDER — MAGNESIUM OXIDE -MG SUPPLEMENT 400 (240 MG) MG PO TABS
800.0000 mg | ORAL_TABLET | Freq: Two times a day (BID) | ORAL | Status: DC
  Administered 2023-09-11: 800 mg via ORAL
  Filled 2023-09-10: qty 2

## 2023-09-10 MED ORDER — HYDRALAZINE HCL 20 MG/ML IJ SOLN
10.0000 mg | Freq: Once | INTRAMUSCULAR | Status: AC
  Administered 2023-09-10: 10 mg via INTRAVENOUS
  Filled 2023-09-10: qty 1

## 2023-09-10 MED ORDER — LORAZEPAM 2 MG/ML IJ SOLN
1.0000 mg | Freq: Once | INTRAMUSCULAR | Status: AC
  Administered 2023-09-10: 1 mg via INTRAVENOUS
  Filled 2023-09-10: qty 1

## 2023-09-10 MED ORDER — ACETAMINOPHEN 500 MG PO TABS
1000.0000 mg | ORAL_TABLET | Freq: Three times a day (TID) | ORAL | Status: DC
  Administered 2023-09-11: 1000 mg via ORAL
  Filled 2023-09-10 (×2): qty 2

## 2023-09-10 MED ORDER — OXYCODONE HCL 5 MG PO TABS: 5.0000 mg | ORAL_TABLET | Freq: Three times a day (TID) | ORAL | Status: DC | PRN

## 2023-09-10 MED ORDER — HYDROMORPHONE HCL 1 MG/ML IJ SOLN: 0.5000 mg | Freq: Four times a day (QID) | INTRAMUSCULAR | Status: DC | PRN

## 2023-09-10 MED ORDER — SENNA 8.6 MG PO TABS
1.0000 | ORAL_TABLET | Freq: Every day | ORAL | Status: DC
Start: 2023-09-10 — End: 2023-09-16
  Administered 2023-09-11 – 2023-09-15 (×4): 8.6 mg via ORAL
  Filled 2023-09-10 (×4): qty 1

## 2023-09-10 NOTE — TOC Progression Note (Addendum)
Transition of Care [facility]) - Progression Note    Patient Details  Name: Tammy Suarez MRN: [id number] Date of Birth: 10-May-1946  [facility]) CM/SW Contact  Leitha Bleak, RN Phone Number: 09/10/2023, 9:35 AM  Clinical Narrative:   Bed offers, but SNF will not take due to sitter, patient confused worse today, MD order CT scan. TOC following for bed offer and to start INS AUTH.    Expected Discharge Plan: Skilled Nursing Facility Barriers to Discharge: Insurance Authorization, No SNF bed  Expected Discharge Plan and Services     Post Acute Care Choice: Home Health         HH Arranged: PT, RN, OT [facility] Agency: [facility] Date [facility] Agency Contacted: 09/07/23   Representative spoke with at [facility] Agency: Clydie Braun   Social Determinants of Health (SDOH) Interventions SDOH Screenings   Food Insecurity: No Food Insecurity (09/04/2023)  Housing: Low Risk  (09/04/2023)  Transportation Needs: No Transportation Needs (09/04/2023)  Utilities: Not At Risk (09/04/2023)  Alcohol Screen: Low Risk  (04/22/2023)  Depression (PHQ2-9): High Risk (05/05/2023)  Financial Resource Strain: Low Risk  (04/22/2023)  Recent Concern: Financial Resource Strain - Medium Risk (02/24/2023)   Received from [facility], [facility]  Physical Activity: Inactive (04/22/2023)  Social Connections: Socially Isolated (04/22/2023)  Stress: No Stress Concern Present (04/22/2023)  Tobacco Use: High Risk (09/04/2023)    Readmission Risk Interventions    09/09/2023   11:24 AM 09/06/2023    1:16 PM  Readmission Risk Prevention Plan  Transportation Screening Complete Complete  PCP or Specialist Appt within 5-7 Days Complete Not Complete  Home Care Screening  Complete  Medication Review (RN CM)  Complete

## 2023-09-10 NOTE — Progress Notes (Signed)
PT Cancellation Note  Patient Details Name: Tammy Suarez MRN: [id number] DOB: Sep 02, 1946   Cancelled Treatment:    Reason Eval/Treat Not Completed: Other (comment) Attempted PT session.  Pt laying supine in bed with oxygen over eyes.  Therapist attempted to adjust and taken vitals with on room air.  Pt refused to let therapist take vitals or complete PT session, stated for therapist "to get the hell out of here".  Therapist left after assuring bed alarm set and call bell within reach.  RN aware of status.  Becky Sax, LPTA/CLT; CBIS [phone]  Juel Burrow 09/10/2023, 8:14 AM

## 2023-09-10 NOTE — Care Management Important Message (Signed)
Important Message  Patient Details  Name: Tammy Suarez MRN: [id number] Date of Birth: 03-Jun-1946   Important Message Given:  Yes - Medicare IM     Corey Harold 09/10/2023, 2:20 PM

## 2023-09-10 NOTE — Progress Notes (Addendum)
Progress Note   Patient: Tammy Suarez [id number]:[id number] DOB: 28-Apr-1946 DOA: 09/04/2023     6 DOS: the patient was seen and examined on 09/10/2023   Brief hospital course: 77yo with h/o PAF, HLD, and hypothyroidism who presented on 10/19 with cough and generalized weakness, found to have LLL PNA.  She was started on Ceftriaxone and Azithromycin.  Assessment and Plan:  Multifocal PNA Started on IV rocephin and IV zithromax, continue with mucinex Improving symptoms, on room air Duonebs as needed Started on claritin for congestion Strep pneumo antigen is negative Improving with IV Antibiotics, will be complete after today's doses She did have an abnormal chest CT in 02/2023 at [facility] with findings "highly concerning for primary lung malignancy"; her daughter reports that she was unaware of this Repeat chest CT was reassuring and appears to show multifocal PNA without apparent malignancy   Dementia, now with delirium Recent onset, rapidly progressive according to her daughter - much worse in the last week or two, per daughter Hypersomnolent 10/24 - possibly due to medications and sleep cycle disturbance  More aware 10/25 but quite confused with some apparent visual hallucinations Frankly agitated 10/26 Likely due to hospital delirium  Pt started on hydroxyzine and haldol without any improvement Added zyprexa She is recommended for SNF discharge at the time of dc; will order [facility] consult to assist - ideally the [facility] in [place] once no longer with such significant delirium Currently with telemedicine sitter, not appropriate for dc at this time Head CT unremarkable One dose of Ativan this AM Palliative care consulted   Atrial fibrillation Rate controlled with metoprolol  Not on AC due to questionable diagnosis    Hypertension Continue Toprol XL, losartan Added hydralazine prn   Hyperlipidemia Continue rosuvastatin and Zetia   Stage 3b CKD Thought to be AKI on  presentation but appears to be stable and at/near baseline Attempt to avoid nephrotoxic medications   Macrocytic anemia Appears to be stable   Thrombocytopenia Also stable   Hypomagnesemia Repleted   Chest pain S/p CABG 2014 Reported chest pain yesterday, now apparently resolved With mildly elevated troponin of 75 and 76,  EKG shows non specific st t wave changes, and t wave inversions Prior echo in 02/2023 was unremarkable  Repeat echo is ordered and also unremarkable Cardiology consulted and is not planning ischemic evaluation Appears to need resumption of ASA 81 mg daily    Hypothyroidism Resume synthroid 125 mcg daily   Moderate malnutrition Nutrition Problem: Moderate Malnutrition Etiology: chronic illness (CHF) Signs/Symptoms: mild fat depletion, mild muscle depletion Interventions: Ensure Enlive (each supplement provides 350kcal and 20 grams of protein), MVI   Obesity Body mass index is 34.16 kg/m.Marland Kitchen  Weight loss should be encouraged Outpatient PCP/bariatric medicine/bariatric surgery f/u encouraged      *There was a notation about a stage 2 pressure ulcer on buttocks but this is not charted in nursing notes and does not appear to be present at this time     Consultants: PT/OT TOC team   Procedures: None   Antibiotics: Ceftriaxone 10/19-23 Azithromycin 10/19-23     30 Day Unplanned Readmission Risk Score    Flowsheet Row ED to Hosp-Admission (Current) from 09/04/2023 in [facility] MEDICAL SURGICAL UNIT  30 Day Unplanned Readmission Risk Score (%) 16.41 Filed at 09/10/2023 0801       This score is the patient's risk of an unplanned readmission within 30 days of being discharged (0 -100%). The score is based on dignosis, age, lab data,  medications, orders, and past utilization.   Low:  0-14.9   Medium: 15-21.9   High: 22-29.9   Extreme: 30 and above           Subjective: Increasingly worse delirium, now requiring a telesitter.  She is awake but  agitated this AM.   Objective: Vitals:   09/10/23 0409 09/10/23 1210  BP: (!) 163/65 (!) 156/67  Pulse: 68 (!) 57  Resp: 20 16  Temp: 98.3 F (36.8 C) 98 F (36.7 C)  SpO2: 95% 98%    Intake/Output Summary (Last 24 hours) at 09/10/2023 1455 Last data filed at 09/10/2023 0956 Gross per 24 hour  Intake 100 ml  Output --  Net 100 ml   Filed Weights   09/04/23 1232  Weight: 90.3 kg    Exam:  General:  Appears calm and comfortable and is in NAD, confused, disheveled, not wearing [place] O2 Eyes:  normal lids, iris ENT:  grossly normal hearing, lips & tongue, mmm Neck:  no LAD, masses or thyromegaly Cardiovascular:  RRR, no m/r/g. No LE edema.  Respiratory:   CTA bilaterally with no wheezes/rales/rhonchi.  Normal respiratory effort. Abdomen:  soft, NT, ND Skin:  no rash or induration seen on limited exam Musculoskeletal:  grossly normal tone BUE/BLE, good ROM, no bony abnormality Psychiatric:  confused/agitated mood and affect, speech sparse and generally inappropriate Neurologic:  CN 2-12 grossly intact, moves all extremities in coordinated fashion  Data Reviewed: I have reviewed the patient's lab results since admission.  Pertinent labs for today include:  Glucose 128 BUN 23/Creatinine 1.38/GFR 39 - stable Mag++ 1.4 WBC 6.4 Hgb 10 - stable Platelets 129    Family Communication: None present; TOC team spoke with her daughter this AM, and palliative care is also likely to reach out today  Disposition: Status is: Inpatient Remains inpatient appropriate because: hyperactive delirium     Time spent: 50 minutes  Unresulted Labs (From admission, onward)     Start     Ordered   09/11/23 0500  Basic metabolic panel  Tomorrow morning,   R       Question:  Specimen collection method  Answer:  Lab=Lab collect   09/10/23 1455   09/11/23 0500  Magnesium  Tomorrow morning,   R       Question:  Specimen collection method  Answer:  Lab=Lab collect   09/10/23 1455              Author: Jonah Blue, MD 09/10/2023 2:55 PM  For on call review [email or web address].

## 2023-09-10 NOTE — Consult Note (Signed)
Consultation Note Date: 09/10/2023   Patient Name: Tammy Suarez  DOB: Aug 31, 1946  MRN: [id number]  Age / Sex: 77 y.o., female  PCP: Kerri Perches, MD Referring Physician: Jonah Blue, MD  Reason for Consultation: Establishing goals of care  HPI/Patient Profile: 77 y.o. female  with past medical history of CHF, afib, CAD, CKD, HLD, GERD, depression, chronic arthritic pain admitted on 09/04/2023 with pneumonia.   Clinical Assessment and Goals of Care: Consult received and extensive chart review completed. Reviewed PCP notes, hospital notes, labs, PDMP. I discussed with nursing staff. I met at Tammy Suarez's bedside but she is sedated. I adjusted her pillow under her head and she awakened briefly but only agitated. She did not speak or engage. She frowned and returned to sleep.   I spoke with daughter, Tammy Suarez, who is her primary caregiver. Tammy Suarez tells me that she has had conversations with Dr. Lodema Hong about likely dementia with forgetfulness. However, Tammy Suarez reports that her mother's agitation and combativeness only began a week prior to hospitalization. She tells me how difficult it has been to see her mother in this state. Tammy Suarez's goal is to stabilize for rehab trial. Tammy Suarez tells me that she plans to take her mother home from rehab as she has no wishes to keep her mother in a facility as she knows she would never want this. Tammy Suarez has been trying to research and educate herself on how to care and manage with people with dementia. We discussed her mother's chronic pain and she takes typically 1-3 oxycodone 10 mg per day. There are some days she does not take any but usually at least one oxycodone dose. Tammy Suarez feels that the 10 mg may be too strong and 7.5 mg may be better. We will consider pain management plan and see if this helps with agitation. Tammy Suarez agrees with palliative to follow.    All questions/concerns addressed. Emotional support provided. Discussed with Dr. Ophelia Charter. Discussed with RN.   Primary Decision Maker NEXT OF KIN daughter Tammy Suarez    SUMMARY OF RECOMMENDATIONS   - DNR in place - Hopeful for SNF rehab with return home under care of daughter  Code Status/Advance Care Planning: DNR   Symptom Management:  Chronic pain:  Tylenol 1000 mg TID.  OxyIR 5 mg q8h PRN moderate pain.  Dilaudid 0.5 IV q6h PRN severe pain.  Bowel Regimen: Senokot 1 tab at bedtime.  Agitation: Try treating pain first.  Agree Zyprexa - can consider 2.5 mg in am and 5 mg at bedtime.   Prognosis:  Overall prognosis poor.   Discharge Planning: Skilled Nursing Facility for rehab with Palliative care service follow-up      Primary Diagnoses: Present on Admission:  Multifocal pneumonia  Malnutrition of moderate degree  Obesity (BMI 30.0-34.9)  Hypothyroidism  Hyperlipidemia with target LDL less than 70  Essential hypertension, benign  DM (diabetes mellitus), type 2 with renal complications (HCC)  Coronary atherosclerosis of native coronary artery  CKD (chronic kidney disease) stage 3, GFR 30-59 ml/min (HCC)  Dementia with  behavioral disturbance (HCC)   I have reviewed the medical record, interviewed the patient and family, and examined the patient. The following aspects are pertinent.  Past Medical History:  Diagnosis Date   Arthritis    Atrial fibrillation (HCC)    Carpal tunnel syndrome    CHF (congestive heart failure) (HCC)    Chronic back pain    CKD (chronic kidney disease) stage 3, GFR 30-59 ml/min (HCC)    Coronary atherosclerosis of native coronary artery    Multivessel status post CABG - LIMA to LAD, SVG to OM and SVG to PDA   Depression    Economic stress    Essential hypertension    GERD (gastroesophageal reflux disease)    Headache    History of conjunctivitis    History of hiatal hernia    Hyperlipidemia    Hypothyroidism    Myocardial  infarction (HCC) 1999   Obesity    Type 2 diabetes mellitus (HCC)    Uterine cancer (HCC)    Vitamin B 12 deficiency    Social History   Socioeconomic History   Marital status: Single    Spouse name: Not on file   Number of children: 3   Years of education: Not on file   Highest education level: GED or equivalent  Occupational History   Not on file  Tobacco Use   Smoking status: Every Day    Current packs/day: 0.25    Average packs/day: 0.3 packs/day for 50.0 years (12.5 ttl pk-yrs)    Types: Cigarettes   Smokeless tobacco: Never   Tobacco comments:    smokes about 3 a day   Vaping Use   Vaping status: Never Used  Substance and Sexual Activity   Alcohol use: No    Alcohol/week: 0.0 standard drinks of alcohol   Drug use: No   Sexual activity: Never    Birth control/protection: Surgical  Other Topics Concern   Not on file  Social History Narrative   Not on file   Social Determinants of Health   Financial Resource Strain: Low Risk  (04/22/2023)   Overall Financial Resource Strain (CARDIA)    Difficulty of Paying Living Expenses: Not hard at all  Recent Concern: Financial Resource Strain - Medium Risk (02/24/2023)   Received from [facility], [facility]   Overall Financial Resource Strain (CARDIA)    Difficulty of Paying Living Expenses: Somewhat hard  Food Insecurity: No Food Insecurity (09/04/2023)   Hunger Vital Sign    Worried About Running Out of Food in the Last Year: Never true    Ran Out of Food in the Last Year: Never true  Transportation Needs: No Transportation Needs (09/04/2023)   PRAPARE - Administrator, Civil Service (Medical): No    Lack of Transportation (Non-Medical): No  Physical Activity: Inactive (04/22/2023)   Exercise Vital Sign    Days of Exercise per Week: 0 days    Minutes of Exercise per Session: 0 min  Stress: No Stress Concern Present (04/22/2023)   [employer] of Occupational Health - Occupational Stress  Questionnaire    Feeling of Stress : Not at all  Social Connections: Socially Isolated (04/22/2023)   Social Connection and Isolation Panel [NHANES]    Frequency of Communication with Friends and Family: Never    Frequency of Social Gatherings with Friends and Family: More than three times a week    Attends Religious Services: Never    Database administrator or Organizations: No  Attends Banker Meetings: Never    Marital Status: Divorced   Family History  Problem Relation Age of Onset   Heart attack Mother    Heart attack Father    Hypertension Sister    Heart attack Sister    Cancer Brother        Renal cell    Hepatitis Daughter    Drug abuse Daughter    Hypertension Daughter    Colon cancer Neg Hx    Scheduled Meds:  DULoxetine  60 mg Oral BID   enoxaparin (LOVENOX) injection  40 mg Subcutaneous Q24H   ezetimibe  10 mg Oral Daily   famotidine  20 mg Oral Daily   feeding supplement  237 mL Oral TID BM   fenofibrate  160 mg Oral Daily   guaiFENesin  600 mg Oral BID   isosorbide mononitrate  30 mg Oral QHS   levothyroxine  125 mcg Oral Q0600   loratadine  10 mg Oral Daily   losartan  100 mg Oral Daily   magnesium oxide  400 mg Oral Daily   metoprolol succinate  25 mg Oral QHS   metoprolol succinate  50 mg Oral Daily   multivitamin with minerals  1 tablet Oral Daily   OLANZapine  2.5 mg Oral Daily   rosuvastatin  40 mg Oral Daily   Continuous Infusions: PRN Meds:.albuterol, guaiFENesin-dextromethorphan, haloperidol lactate, hydrALAZINE, hydrOXYzine, nitroGLYCERIN, ondansetron (ZOFRAN) IV **OR** ondansetron, senna-docusate Allergies  Allergen Reactions   Gabapentin Other (See Comments)    Feels dizzy   Meperidine Hcl Nausea And Vomiting   Niacin Other (See Comments)    REACTION: face peeling and burning   Propoxyphene N-Acetaminophen Nausea And Vomiting   Hydrocodone-Acetaminophen Nausea And Vomiting   Review of Systems  Unable to perform ROS:  Acuity of condition    Physical Exam Vitals and nursing note reviewed.  Constitutional:      General: She is sleeping.     Appearance: She is ill-appearing.  Cardiovascular:     Rate and Rhythm: Bradycardia present.  Pulmonary:     Effort: No tachypnea, accessory muscle usage or respiratory distress.  Abdominal:     Palpations: Abdomen is soft.  Neurological:     Comments: Sleeping - agitated and confused when awake     Vital Signs: BP (!) 156/67 (BP Location: Left Arm)   Pulse (!) 57   Temp 98 F (36.7 C) (Oral)   Resp 16   Ht 5\' 4"  (1.626 m)   Wt 90.3 kg   SpO2 98%   BMI 34.16 kg/m  Pain Scale: 0-10 POSS *See Group Information*: S-Acceptable,Sleep, easy to arouse Pain Score: 0-No pain   SpO2: SpO2: 98 % O2 Device:SpO2: 98 % O2 Flow Rate: .O2 Flow Rate (L/min): 2 L/min  IO: Intake/output summary:  Intake/Output Summary (Last 24 hours) at 09/10/2023 1409 Last data filed at 09/10/2023 1610 Gross per 24 hour  Intake 100 ml  Output --  Net 100 ml    LBM: Last BM Date : 09/07/23 Baseline Weight: Weight: 90.3 kg Most recent weight: Weight: 90.3 kg     Palliative Assessment/Data:    Time Total: 80 min  Greater than 50%  of this time was spent counseling and coordinating care related to the above assessment and plan.  Signed by: Yong Channel, NP Palliative Medicine Team Pager # [phone] (M-F 8a-5p) Team Phone # [phone] (Nights/Weekends)

## 2023-09-10 NOTE — Plan of Care (Signed)
  Problem: Pain Managment: Goal: General experience of comfort will improve Outcome: Progressing   Problem: Safety: Goal: Ability to remain free from injury will improve Outcome: Progressing   Problem: Skin Integrity: Goal: Risk for impaired skin integrity will decrease Outcome: Progressing   

## 2023-09-10 NOTE — Progress Notes (Signed)
Pt refused all AM PO meds. Pt using profanities towards this RN and swinging arms in attempt to hit this RN. Jonah Blue, MD notified. Telesitter remains in place. See new order for one time IV Ativan.

## 2023-09-11 DIAGNOSIS — J189 Pneumonia, unspecified organism: Secondary | ICD-10-CM | POA: Diagnosis not present

## 2023-09-11 LAB — GLUCOSE, RANDOM: Glucose, Bld: 99 mg/dL (ref 70–99)

## 2023-09-11 LAB — BASIC METABOLIC PANEL
Anion gap: 11 (ref 5–15)
BUN: 25 mg/dL — ABNORMAL HIGH (ref 8–23)
CO2: 25 mmol/L (ref 22–32)
Calcium: 9.3 mg/dL (ref 8.9–10.3)
Chloride: 101 mmol/L (ref 98–111)
Creatinine, Ser: 1.4 mg/dL — ABNORMAL HIGH (ref 0.44–1.00)
GFR, Estimated: 39 mL/min — ABNORMAL LOW (ref >60)
Glucose, Bld: 95 mg/dL (ref 70–99)
Potassium: 3.7 mmol/L (ref 3.5–5.1)
Sodium: 137 mmol/L (ref 135–145)

## 2023-09-11 LAB — GLUCOSE, CAPILLARY
Glucose-Capillary: 65 mg/dL — ABNORMAL LOW (ref 70–99)
Glucose-Capillary: 104 mg/dL — ABNORMAL HIGH (ref 70–99)
Glucose-Capillary: 18 mg/dL — CL (ref 70–99)
Glucose-Capillary: 24 mg/dL — CL (ref 70–99)
Glucose-Capillary: 32 mg/dL — CL (ref 70–99)
Glucose-Capillary: 41 mg/dL — CL (ref 70–99)
Glucose-Capillary: 126 mg/dL — ABNORMAL HIGH (ref 70–99)
Glucose-Capillary: 112 mg/dL — ABNORMAL HIGH (ref 70–99)

## 2023-09-11 LAB — MAGNESIUM: Magnesium: 1.6 mg/dL — ABNORMAL LOW (ref 1.7–2.4)

## 2023-09-11 MED ORDER — GLYCOPYRROLATE 0.2 MG/ML IJ SOLN: 0.2000 mg | INTRAMUSCULAR | Status: DC | PRN

## 2023-09-11 MED ORDER — LORAZEPAM 2 MG/ML PO CONC: 1.0000 mg | ORAL | Status: DC | PRN

## 2023-09-11 MED ORDER — HALOPERIDOL LACTATE 5 MG/ML IJ SOLN
0.5000 mg | INTRAMUSCULAR | Status: DC | PRN
  Filled 2023-09-11: qty 1

## 2023-09-11 MED ORDER — HALOPERIDOL LACTATE 2 MG/ML PO CONC: 0.5000 mg | ORAL | Status: DC | PRN

## 2023-09-11 MED ORDER — HALOPERIDOL 0.5 MG PO TABS: 0.5000 mg | ORAL_TABLET | ORAL | Status: DC | PRN

## 2023-09-11 MED ORDER — LORAZEPAM 2 MG/ML IJ SOLN
1.0000 mg | INTRAMUSCULAR | Status: DC | PRN
  Administered 2023-09-12: 1 mg via INTRAVENOUS
  Filled 2023-09-11: qty 1

## 2023-09-11 MED ORDER — MORPHINE SULFATE (PF) 2 MG/ML IV SOLN
1.0000 mg | INTRAVENOUS | Status: DC | PRN
  Administered 2023-09-12: 1 mg via INTRAVENOUS
  Filled 2023-09-11: qty 1

## 2023-09-11 MED ORDER — ACETAMINOPHEN 325 MG PO TABS
650.0000 mg | ORAL_TABLET | Freq: Four times a day (QID) | ORAL | Status: DC | PRN
  Administered 2023-09-12 – 2023-09-15 (×2): 650 mg via ORAL
  Filled 2023-09-11 (×2): qty 2

## 2023-09-11 MED ORDER — LORAZEPAM 1 MG PO TABS
1.0000 mg | ORAL_TABLET | ORAL | Status: DC | PRN
  Administered 2023-09-13: 1 mg via ORAL
  Filled 2023-09-11: qty 1

## 2023-09-11 MED ORDER — BIOTENE DRY MOUTH MT LIQD: 15.0000 mL | OROMUCOSAL | Status: DC | PRN

## 2023-09-11 MED ORDER — ACETAMINOPHEN 650 MG RE SUPP: 650.0000 mg | Freq: Four times a day (QID) | RECTAL | Status: DC | PRN

## 2023-09-11 MED ORDER — DIPHENHYDRAMINE HCL 50 MG/ML IJ SOLN: 12.5000 mg | INTRAMUSCULAR | Status: DC | PRN

## 2023-09-11 MED ORDER — GLYCOPYRROLATE 1 MG PO TABS: 1.0000 mg | ORAL_TABLET | ORAL | Status: DC | PRN

## 2023-09-11 MED ORDER — POLYVINYL ALCOHOL 1.4 % OP SOLN: 1.0000 [drp] | Freq: Four times a day (QID) | OPHTHALMIC | Status: DC | PRN

## 2023-09-11 MED ORDER — OXYCODONE HCL 5 MG PO TABS
5.0000 mg | ORAL_TABLET | Freq: Two times a day (BID) | ORAL | Status: DC
  Administered 2023-09-11 – 2023-09-16 (×10): 5 mg via ORAL
  Filled 2023-09-11 (×10): qty 1

## 2023-09-11 NOTE — TOC Progression Note (Signed)
Transition of Care [facility]) - Progression Note    Patient Details  Name: Tammy Suarez MRN: [id number] Date of Birth: Apr 12, 1946  Transition of Care [facility]) CM/SW Contact  Catalina Gravel, [place] Phone Number: 09/11/2023, 10:35 AM  Clinical Narrative:    CSW met with MD who states pt needs continued care likely through weekend. SNF is goal and pt should be ready to send for Auth by Mon. TOC to follow.    Expected Discharge Plan: Skilled Nursing Facility Barriers to Discharge: Continued Medical Work up  Expected Discharge Plan and Services     Post Acute Care Choice: Home Health                             HH Arranged: PT, RN, OT [facility] Agency: [facility] Date [facility] Agency Contacted: 09/07/23   Representative spoke with at [facility] Agency: Clydie Braun   Social Determinants of Health (SDOH) Interventions SDOH Screenings   Food Insecurity: No Food Insecurity (09/04/2023)  Housing: Low Risk  (09/04/2023)  Transportation Needs: No Transportation Needs (09/04/2023)  Utilities: Not At Risk (09/04/2023)  Alcohol Screen: Low Risk  (04/22/2023)  Depression (PHQ2-9): High Risk (05/05/2023)  Financial Resource Strain: Low Risk  (04/22/2023)  Recent Concern: Financial Resource Strain - Medium Risk (02/24/2023)   Received from [facility]  Physical Activity: Inactive (04/22/2023)  Social Connections: Socially Isolated (04/22/2023)  Stress: No Stress Concern Present (04/22/2023)  Tobacco Use: High Risk (09/04/2023)    Readmission Risk Interventions    09/09/2023   11:24 AM 09/06/2023    1:16 PM  Readmission Risk Prevention Plan  Transportation Screening Complete Complete  PCP or Specialist Appt within 5-7 Days Complete Not Complete  Home Care Screening  Complete  Medication Review (RN CM)  Complete

## 2023-09-11 NOTE — Progress Notes (Signed)
Progress Note   Patient: Tammy Suarez [id number] DOB: 105/02/1946 DOA: 09/04/2023     7 DOS: the patient was seen and examined on 09/11/2023   Brief hospital course: 77yo with h/o PAF, HLD, and hypothyroidism who presented on 10/19 with cough and generalized weakness, found to have LLL PNA.  She was started on Ceftriaxone and Azithromycin.  Assessment and Plan:  End of life care After further discussion with her daughter today (10/26), her mother is suffering and this is not what she would want Family has decided to proceed with comfort care only Comfort care order set utilized Pain control with morphine, may need transition to drip based on chronic pain  Multifocal PNA Started on IV rocephin and IV zithromax, continue with mucinex Improving symptoms, on room air Duonebs as needed Started on claritin for congestion Strep pneumo antigen is negative Improving with IV Antibiotics, completed on 10/23 She did have an abnormal chest CT in 02/2023 at [facility] with findings "highly concerning for primary lung malignancy"; her daughter reports that she was unaware of this Repeat chest CT on 10/23 was reassuring and appears to show multifocal PNA without apparent malignancy She is coughing some this AM and on O2 again   Dementia, now with delirium Recent onset, rapidly progressive according to her daughter - much worse in the last week or two, per daughter Hypersomnolent 10/24 - possibly due to medications and sleep cycle disturbance  More aware 10/25 but quite confused with some apparent visual hallucinations Frankly agitated 10/26 More calm on 10/27 with perseverance in speech Likely due to hospital delirium  Pt started on hydroxyzine and haldol without any improvement Added zyprexa There may also be a component of opiate withdrawal - not listed on med rec but daughter reported chronic use to palliative team and this is on her PDMP report; added standing oxy 5 mg as well as prn oxy She  is recommended for SNF discharge at the time of dc; now transitioning to comfort only Currently with telemedicine sitter, not appropriate for dc at this time Head CT unremarkable on 10/25 Palliative care consulted   Atrial fibrillation Rate controlled with metoprolol - stop with transition to comfort Not on AC due to questionable diagnosis    Hypertension Stop Toprol XL, losartan   Hyperlipidemia Will stop rosuvastatin and Zetia   Stage 3b CKD Thought to be AKI on presentation but appears to be stable and at/near baseline   Macrocytic anemia Appears to be stable   Thrombocytopenia Also stable   Hypomagnesemia Repleted   Chest pain S/p CABG 2014 Reported chest pain early in hospitalization, now apparently resolved With mildly elevated troponin of 75 and 76 EKG shows non specific st t wave changes, and t wave inversions Prior echo in 02/2023 was unremarkable  Repeat echo is ordered and also unremarkable Cardiology consulted and is not planning ischemic evaluation   Hypothyroidism Stop Synthroid with transition to comfort care   Moderate malnutrition Nutrition Problem: Moderate Malnutrition Etiology: chronic illness (CHF) Signs/Symptoms: mild fat depletion, mild muscle depletion Interventions: Ensure Enlive (each supplement provides 350kcal and 20 grams of protein), MVI   Obesity Body mass index is 34.16 kg/m.Marland Kitchen  Weight loss should be encouraged Outpatient PCP/bariatric medicine/bariatric surgery f/u encouraged      *There was a notation about a stage 2 pressure ulcer on buttocks but this is not charted in nursing notes and does not appear to be present at this time     Consultants: Palliative care PT/OT [facility] team  Progress Note   Patient: Tammy Suarez [id number] DOB: 101/30/1947 DOA: 09/04/2023     7 DOS: the patient was seen and examined on 09/11/2023   Brief hospital course: 77yo with h/o PAF, HLD, and hypothyroidism who presented on 10/19 with cough and generalized weakness, found to have LLL PNA.  She was started on Ceftriaxone and Azithromycin.  Assessment and Plan:  End of life care After further discussion with her daughter today (10/26), her mother is suffering and this is not what she would want Family has decided to proceed with comfort care only Comfort care order set utilized Pain control with morphine, may need transition to drip based on chronic pain  Multifocal PNA Started on IV rocephin and IV zithromax, continue with mucinex Improving symptoms, on room air Duonebs as needed Started on claritin for congestion Strep pneumo antigen is negative Improving with IV Antibiotics, completed on 10/23 She did have an abnormal chest CT in 02/2023 at [facility] with findings "highly concerning for primary lung malignancy"; her daughter reports that she was unaware of this Repeat chest CT on 10/23 was reassuring and appears to show multifocal PNA without apparent malignancy She is coughing some this AM and on O2 again   Dementia, now with delirium Recent onset, rapidly progressive according to her daughter - much worse in the last week or two, per daughter Hypersomnolent 10/24 - possibly due to medications and sleep cycle disturbance  More aware 10/25 but quite confused with some apparent visual hallucinations Frankly agitated 10/26 More calm on 10/27 with perseverance in speech Likely due to hospital delirium  Pt started on hydroxyzine and haldol without any improvement Added zyprexa There may also be a component of opiate withdrawal - not listed on med rec but daughter reported chronic use to palliative team and this is on her PDMP report; added standing oxy 5 mg as well as prn oxy She  is recommended for SNF discharge at the time of dc; now transitioning to comfort only Currently with telemedicine sitter, not appropriate for dc at this time Head CT unremarkable on 10/25 Palliative care consulted   Atrial fibrillation Rate controlled with metoprolol - stop with transition to comfort Not on AC due to questionable diagnosis    Hypertension Stop Toprol XL, losartan   Hyperlipidemia Will stop rosuvastatin and Zetia   Stage 3b CKD Thought to be AKI on presentation but appears to be stable and at/near baseline   Macrocytic anemia Appears to be stable   Thrombocytopenia Also stable   Hypomagnesemia Repleted   Chest pain S/p CABG 2014 Reported chest pain early in hospitalization, now apparently resolved With mildly elevated troponin of 75 and 76 EKG shows non specific st t wave changes, and t wave inversions Prior echo in 02/2023 was unremarkable  Repeat echo is ordered and also unremarkable Cardiology consulted and is not planning ischemic evaluation   Hypothyroidism Stop Synthroid with transition to comfort care   Moderate malnutrition Nutrition Problem: Moderate Malnutrition Etiology: chronic illness (CHF) Signs/Symptoms: mild fat depletion, mild muscle depletion Interventions: Ensure Enlive (each supplement provides 350kcal and 20 grams of protein), MVI   Obesity Body mass index is 34.16 kg/m.Marland Kitchen  Weight loss should be encouraged Outpatient PCP/bariatric medicine/bariatric surgery f/u encouraged      *There was a notation about a stage 2 pressure ulcer on buttocks but this is not charted in nursing notes and does not appear to be present at this time     Consultants: Palliative care PT/OT [facility] team

## 2023-09-11 NOTE — Progress Notes (Signed)
Patient is awake, confused and kept on asking where she is at the moment, oriented her that she is in [facility]. She then asked why she is here and who brought her here, this nurse explained to patient reason of admission. Patient has no urine output noted since 1900, she has not drank anything as well since 1900, bladder scan performed and noted 296 mls, patient stated she doesn't feel like urinating right now. This nurse asked patient if I can turn her on her side to off load her buttocks to which she responded "No, I dont want to, I am comfortable right now."This nurse lowered down the head to decrease pressure on bottom.

## 2023-09-11 NOTE — Plan of Care (Signed)
  Problem: Activity: Goal: Risk for activity intolerance will decrease Outcome: Not Progressing   Problem: Nutrition: Goal: Adequate nutrition will be maintained Outcome: Not Progressing   Problem: Safety: Goal: Ability to remain free from injury will improve Outcome: Not Progressing   

## 2023-09-11 NOTE — Progress Notes (Signed)
0800: Notified by NT that pt's CBG readings were 65 mg/dl and then on recheck with different finger registered 18 mg/dl. In to eval pt, Pt drowsy but arouses to name called. Skin warm and dry, color pink. Pt able to state name and DOB, but disoriented to place/time/situation. Breath sounds clear, HR regular. Pt able to follow commands. Denies complaint of pain or SOB.   0805: Pt given OJ to drink and NT rechecked CBG with reading of 32 mg/dl. Lab notified for glucose verification. Pt took entire 240 ml of OJ without difficulty. 7829: Lab at bedside for glucose draw. Pt cooperative. 5621: Repeat CBG 104 mg/dl in left hand but only reads in 30's in right hand. Pt remains drowsy but arouses to name called, skin warm and dry. Fingertips of both hands are cool to touch and dusky in color. B/p 176/58, HR 58/min, RR 18 & non-labored, SaO2 89% on 1 lpm [place]. O2 increased to 2 lpm [place]. MD notified of above and current status.

## 2023-09-11 NOTE — Plan of Care (Signed)

## 2023-09-12 DIAGNOSIS — J189 Pneumonia, unspecified organism: Secondary | ICD-10-CM | POA: Diagnosis not present

## 2023-09-12 LAB — GLUCOSE, CAPILLARY: Glucose-Capillary: 139 mg/dL — ABNORMAL HIGH (ref 70–99)

## 2023-09-12 NOTE — Plan of Care (Signed)
  Problem: Respiratory: Goal: Verbalizations of increased ease of respirations will increase Outcome: Not Progressing   Problem: Role Relationship: Goal: Family's ability to cope with current situation will improve Outcome: Not Progressing Goal: Ability to verbalize concerns, feelings, and thoughts to partner or family member will improve Outcome: Not Progressing

## 2023-09-12 NOTE — Progress Notes (Signed)
Progress Note   Patient: Tammy Suarez JYN:[id number] DOB: [birth year] DOA: 09/04/2023     8 DOS: the patient was seen and examined on 09/12/2023   Brief hospital course: 77yo with h/o PAF, HLD, and hypothyroidism who presented on 10/19 with cough and generalized weakness, found to have LLL PNA.  She was started on Ceftriaxone and Azithromycin.  Despite treatment, she has had worsening delirium.   Resumed opiates, better intermittently.  Palliative care involved, GOC conversation led to plan for home with hospice on 10/28.  Assessment and Plan:  End of life care After further discussion with her daughter on 10/26, her mother is suffering and this is not what she would want Family has decided to proceed with comfort care only Comfort care order set utilized Pain control with morphine, may need transition to drip based on chronic pain Somewhat improved/stable on 10/27 and so family now plans to take her home with home hospice on 10/28   Multifocal PNA Started on IV rocephin and IV zithromax, continue with mucinex Improving symptoms, on room air with periodic need for [place] O2 supplementation for comfort more than apparent hypoxia Duonebs as needed Started on claritin for congestion Strep pneumo antigen is negative Improving with IV Antibiotics, completed on 10/23 She did have an abnormal chest CT in 02/2023 at [facility] with findings "highly concerning for primary lung malignancy"; her daughter reports that she was unaware of this Repeat chest CT on 10/23 was reassuring with multifocal PNA without apparent malignancy   Dementia, now with delirium Recent onset, rapidly progressive according to her daughter - much worse in the last week or two, per daughter Hypersomnolent 10/23 - possibly due to medications and sleep cycle disturbance  More aware 10/24 but quite confused with some apparent visual hallucinations Frankly agitated 10/25 More calm on 10/26 with perseverance in speech Pleasant and  oriented to person/place/time but still confused on 10/27 Likely due to hospital delirium  Pt started on hydroxyzine and haldol without any improvement Added zyprexa There may also be a component of opiate withdrawal - not listed on med rec but daughter reported chronic use to palliative team and this is on her PDMP report; added standing oxy 5 mg as well as prn oxy; her current improvement may be related to resumption or may be transient, will continue to monitor She was recommended for [facility] and then SNF at discharge but current plan is for discharge to home with hospice on 10/28 given her transition to comfort only Currently with telemedicine sitter but much more calm this AM so will dc this Head CT unremarkable on 10/25 Palliative care consulted   Atrial fibrillation Rate controlled with metoprolol - stop with transition to comfort Not on AC due to questionable diagnosis    Hypertension Stop Toprol XL, losartan   Hyperlipidemia Will stop rosuvastatin and Zetia   Stage 3b CKD Thought to be AKI on presentation but appears to be stable and at/near baseline   Macrocytic anemia Appears to be stable   Thrombocytopenia Also stable   Hypomagnesemia Repleted   Chest pain S/p CABG 2014 Reported chest pain early in hospitalization, now apparently resolved With mildly elevated troponin of 75 and 76 EKG shows non specific st t wave changes, and t wave inversions Prior echo in 02/2023 was unremarkable  Repeat echo is ordered and also unremarkable Cardiology consulted and is not planning ischemic evaluation   Hypothyroidism Stop Synthroid with transition to comfort care   Moderate malnutrition Nutrition Problem: Moderate Malnutrition Etiology: chronic  illness (CHF) Signs/Symptoms: mild fat depletion, mild muscle depletion Interventions: Ensure Enlive (each supplement provides 350kcal and 20 grams of protein), MVI   Obesity Body mass index is 34.16 kg/m.Marland Kitchen  Weight loss should be  encouraged Outpatient PCP/bariatric medicine/bariatric surgery f/u encouraged      *There was a notation about a stage 2 pressure ulcer on buttocks but this is not charted in nursing notes and does not appear to be present at this time     Consultants: Palliative care PT/OT TOC team   Procedures: None   Antibiotics: Ceftriaxone 10/19-23 Azithromycin 10/19-23    30 Day Unplanned Readmission Risk Score    Flowsheet Row ED to Hosp-Admission (Current) from 09/04/2023 in [facility] MEDICAL SURGICAL UNIT  30 Day Unplanned Readmission Risk Score (%) 17.22 Filed at 09/12/2023 0401       This score is the patient's risk of an unplanned readmission within 30 days of being discharged (0 -100%). The score is based on dignosis, age, lab data, medications, orders, and past utilization.   Low:  0-14.9   Medium: 15-21.9   High: 22-29.9   Extreme: 30 and above           Subjective: Much more alert and less altered today.  She is oriented x 3 but still confused.  She is uncertain why she admitted and wants to "sign myself out" and go home.     Objective: Vitals:   09/11/23 1355 09/11/23 2123  BP: (!) 154/60 (!) 162/63  Pulse: 65 79  Resp: 16 20  Temp: 98.7 F (37.1 C) 98.6 F (37 C)  SpO2: 99% 100%    Intake/Output Summary (Last 24 hours) at 09/12/2023 1332 Last data filed at 09/12/2023 0600 Gross per 24 hour  Intake 250 ml  Output 450 ml  Net -200 ml   Filed Weights   09/04/23 1232  Weight: 90.3 kg    Exam:  General:  Appears calm and comfortable and is in NAD, less confused, [place] O2 in place Eyes:  normal lids, iris ENT:  grossly normal hearing, lips & tongue, mmm Neck:  no LAD, masses or thyromegaly Cardiovascular:  RRR, no m/r/g. No LE edema.  Respiratory:   CTA bilaterally with no wheezes/rales/rhonchi.  Normal respiratory effort. Abdomen:  soft, NT, ND Skin:  no rash or induration seen on limited exam; dusky/bluish diffuse fingertips as well as R  toes Musculoskeletal:  grossly normal tone BUE/BLE, good ROM, no bony abnormality Psychiatric:  confused mood and affect, speech fluent and repetitive (asking what is wrong with her, asking if her mother is dead, etc) Neurologic:  CN 2-12 grossly intact, moves all extremities in coordinated fashion  Data Reviewed: I have reviewed the patient's lab results since admission.  Pertinent labs for today include:  None today      Family Communication: TOC team spoke with daughter multiple times today  Disposition: Status is: Inpatient Remains inpatient appropriate because: unsafe disposition     Time spent: 35 minutes  Unresulted Labs (From admission, onward)    None        Author: Jonah Blue, MD 09/12/2023 1:32 PM  For on call review [email or web address].

## 2023-09-12 NOTE — Plan of Care (Signed)
Problem: Education: Goal: Knowledge of General Education information will improve Description: Including pain rating scale, medication(s)/side effects and non-pharmacologic comfort measures 09/12/2023 0025 by Cottie Banda, Mardelle Matte, RN Outcome: Completed/Met 09/12/2023 0025 by Cottie Banda, Mardelle Matte, RN Outcome: Not Progressing   Problem: Health Behavior/Discharge Planning: Goal: Ability to manage health-related needs will improve 09/12/2023 0025 by Cottie Banda, Mardelle Matte, RN Outcome: Completed/Met 09/12/2023 0025 by Cottie Banda, Mardelle Matte, RN Outcome: Not Progressing   Problem: Clinical Measurements: Goal: Ability to maintain clinical measurements within normal limits will improve 09/12/2023 0025 by Cottie Banda, Mardelle Matte, RN Outcome: Completed/Met 09/12/2023 0025 by Cottie Banda, Mardelle Matte, RN Outcome: Not Progressing Goal: Will remain free from infection 09/12/2023 0025 by Cottie Banda, Mardelle Matte, RN Outcome: Completed/Met 09/12/2023 0025 by Cottie Banda, Mardelle Matte, RN Outcome: Not Progressing Goal: Diagnostic test results will improve 09/12/2023 0025 by Cottie Banda, Mardelle Matte, RN Outcome: Completed/Met 09/12/2023 0025 by Cottie Banda, Mardelle Matte, RN Outcome: Not Progressing Goal: Respiratory complications will improve 09/12/2023 0025 by Cottie Banda, Mardelle Matte, RN Outcome: Completed/Met 09/12/2023 0025 by Cottie Banda, Mardelle Matte, RN Outcome: Not Progressing Goal: Cardiovascular complication will be avoided 09/12/2023 0025 by Cottie Banda, Mardelle Matte, RN Outcome: Completed/Met 09/12/2023 0025 by Cottie Banda, Mardelle Matte, RN Outcome: Not Progressing   Problem: Clinical Measurements: Goal: Will remain free from infection 09/12/2023 0025 by Cottie Banda, Mardelle Matte, RN Outcome: Completed/Met 09/12/2023 0025 by Cottie Banda, Mardelle Matte, RN Outcome: Not Progressing   Problem: Clinical Measurements: Goal: Diagnostic test results will improve 09/12/2023 0025 by Cottie Banda, Mardelle Matte, RN Outcome: Completed/Met 09/12/2023 0025 by Cottie Banda, Mardelle Matte, RN Outcome: Not Progressing   Problem: Clinical Measurements: Goal: Respiratory complications will improve 09/12/2023 0025 by Maryjean Morn, RN Outcome: Completed/Met 09/12/2023 0025 by Cottie Banda, Mardelle Matte, RN Outcome: Not Progressing   Problem: Clinical Measurements: Goal: Cardiovascular complication will be avoided 09/12/2023 0025 by Maryjean Morn, RN Outcome: Completed/Met 09/12/2023 0025 by Cottie Banda, Mardelle Matte, RN Outcome: Not Progressing   Problem: Activity: Goal: Risk for activity intolerance will decrease 09/12/2023 0025 by Cottie Banda, Mardelle Matte, RN Outcome: Completed/Met 09/12/2023 0025 by Cottie Banda, Mardelle Matte, RN Outcome: Not Progressing   Problem: Nutrition: Goal: Adequate nutrition will be maintained 09/12/2023 0025 by Cottie Banda, Mardelle Matte, RN Outcome: Completed/Met 09/12/2023 0025 by Cottie Banda, Mardelle Matte, RN Outcome: Not Progressing   Problem: Coping: Goal: Level of anxiety will decrease 09/12/2023 0025 by Cottie Banda, Mardelle Matte, RN Outcome: Completed/Met 09/12/2023 0025 by Cottie Banda, Mardelle Matte, RN Outcome: Not Progressing   Problem: Elimination: Goal: Will not experience complications related to bowel motility 09/12/2023 0025 by Cottie Banda, Mardelle Matte, RN Outcome: Completed/Met 09/12/2023 0025 by Cottie Banda, Mardelle Matte, RN Outcome: Not Progressing Goal: Will not experience complications related to urinary retention 09/12/2023 0025 by Maryjean Morn, RN Outcome: Completed/Met 09/12/2023 0025 by Cottie Banda, Mardelle Matte, RN Outcome: Not Progressing   Problem: Elimination: Goal: Will not experience complications related to urinary retention 09/12/2023 0025 by Maryjean Morn, RN Outcome: Completed/Met 09/12/2023 0025 by Cottie Banda, Mardelle Matte, RN Outcome: Not Progressing   Problem: Pain  Managment: Goal: General experience of comfort will improve 09/12/2023 0025 by Cottie Banda, Mardelle Matte, RN Outcome: Completed/Met 09/12/2023  0025 by Cottie Banda, Mardelle Matte, RN Outcome: Not Progressing   Problem: Safety: Goal: Ability to remain free from injury will improve 09/12/2023 0025 by Cottie Banda, Mardelle Matte, RN Outcome: Completed/Met 09/12/2023 0025 by Cottie Banda, Mardelle Matte, RN Outcome: Not Progressing   Problem: Skin Integrity: Goal: Risk for impaired skin integrity will decrease 09/12/2023 0025 by Cottie Banda, Mardelle Matte, RN Outcome: Completed/Met 09/12/2023 0025 by Cottie Banda, Mardelle Matte, RN Outcome: Not Progressing   Problem: Education: Goal: Knowledge of the prescribed therapeutic regimen will improve 09/12/2023 0025 by Maryjean Morn, RN Outcome: Completed/Met 09/12/2023 0025 by Cottie Banda, Mardelle Matte, RN Outcome: Not Progressing   Problem: Clinical Measurements: Goal: Quality of life will improve 09/12/2023 0025 by Maryjean Morn, RN Outcome: Completed/Met 09/12/2023 0025 by Cottie Banda, Mardelle Matte, RN Outcome: Not Progressing   Problem: Coping: Goal: Ability to identify and develop effective coping behavior will improve 09/12/2023 0025 by Cottie Banda, Mardelle Matte, RN Outcome: Completed/Met 09/12/2023 0025 by Cottie Banda, Mardelle Matte, RN Outcome: Not Progressing   Problem: Role Relationship: Goal: Family's ability to cope with current situation will improve 09/12/2023 0025 by Cottie Banda, Mardelle Matte, RN Outcome: Completed/Met 09/12/2023 0025 by Cottie Banda, Mardelle Matte, RN Outcome: Not Progressing Goal: Ability to verbalize concerns, feelings, and thoughts to partner or family member will improve 09/12/2023 0025 by Cottie Banda, Mardelle Matte, RN Outcome: Completed/Met 09/12/2023 0025 by Cottie Banda, Mardelle Matte, RN Outcome: Not Progressing   Problem: Pain Management: Goal: Satisfaction with pain management regimen will  improve 09/12/2023 0025 by Maryjean Morn, RN Outcome: Completed/Met 09/12/2023 0025 by Cottie Banda, Mardelle Matte, RN Outcome: Not Progressing

## 2023-09-12 NOTE — TOC Progression Note (Addendum)
Transition of Care [facility]) - Progression Note    Patient Details  Name: Cambell Clopton MRN: [id number] Date of Birth: 1946-11-13  [facility] CM/SW Contact  Catalina Gravel, [place] Phone Number: 09/12/2023, 10:38 AM  Clinical Narrative:     Pt and Dr. And daughter met yesterday to discuss comfort measures. Dr placed on full CC 10/26.  Pt plan is to return home with services. Pt not longer SNF plan .  CSW contacted adult daughter today and discussed prior SNF plan now that CC.  Daughter agreed pt preference is at home and not rehab. CSW agreed to refer pt to [facility] for hospice Care at home when DC ready and no longer pursue SNF.  Clinicals sent via Hub and text to [facility] to refer pt for in home Hospice service. TOC to follow.  Addendum: CSW visited pt at bedside, Pt in agreement with going home shared [facility] referral for svc.  Pt asked where is daughter and when can she go home.  CSW shared that I will reach out to Dr and daughter and Dr. Stann Mainland determine when DC.    CSW contacted daughter, who is in agreement with plan.  Asked if pt was DC ready today would that be ok.  Daughter states she cannot accept pt  home until tomorrow- no car today and she was not expecting her to return today, CSW sent [place] to Dr. Jory Sims to follow.   Expected Discharge Plan: Skilled Nursing Facility Barriers to Discharge: Continued Medical Work up  Expected Discharge Plan and Services     Post Acute Care Choice: Home Health                             HH Arranged: PT, RN, OT [facility] Agency: [facility] Date [facility] Agency Contacted: 09/07/23   Representative spoke with at [facility] Agency: Clydie Braun   Social Determinants of Health (SDOH) Interventions SDOH Screenings   Food Insecurity: No Food Insecurity (09/04/2023)  Housing: Low Risk  (09/04/2023)  Transportation Needs: No Transportation Needs (09/04/2023)  Utilities: Not At Risk (09/04/2023)   Alcohol Screen: Low Risk  (04/22/2023)  Depression (PHQ2-9): High Risk (05/05/2023)  Financial Resource Strain: Low Risk  (04/22/2023)  Recent Concern: Financial Resource Strain - Medium Risk (02/24/2023)   Received from [facility]  Physical Activity: Inactive (04/22/2023)  Social Connections: Socially Isolated (04/22/2023)  Stress: No Stress Concern Present (04/22/2023)  Tobacco Use: High Risk (09/04/2023)    Readmission Risk Interventions    09/09/2023   11:24 AM 09/06/2023    1:16 PM  Readmission Risk Prevention Plan  Transportation Screening Complete Complete  PCP or Specialist Appt within 5-7 Days Complete Not Complete  Home Care Screening  Complete  Medication Review (RN CM)  Complete

## 2023-09-13 DIAGNOSIS — J189 Pneumonia, unspecified organism: Secondary | ICD-10-CM | POA: Diagnosis not present

## 2023-09-13 MED ORDER — HALOPERIDOL LACTATE 5 MG/ML IJ SOLN
5.0000 mg | Freq: Once | INTRAMUSCULAR | Status: AC
  Administered 2023-09-13: 5 mg via INTRAMUSCULAR

## 2023-09-13 MED ORDER — OLANZAPINE 2.5 MG PO TABS: 2.5000 mg | ORAL_TABLET | Freq: Every day | ORAL | 1 refills | Status: DC

## 2023-09-13 NOTE — Discharge Summary (Signed)
Physician Discharge Summary   Patient: Tammy Suarez MRN: [id number] DOB: 1945/12/19  Admit date:     09/04/2023  Discharge date: 09/13/23  Discharge Physician: Jonah Blue   PCP: Kerri Perches, MD   Recommendations at discharge:   You are being discharged home under the care of hospice Medications have been stopped other than for comfort Continue home Percocet if needed for pain Zyprexa is also ordered  Should home medications be needed, please discuss with hospice doctor and/or Dr. Lodema Hong  Discharge Diagnoses: Principal Problem:   Multifocal pneumonia Active Problems:   Hypothyroidism   Hyperlipidemia with target LDL less than 70   Obesity (BMI 30.0-34.9)   Essential hypertension, benign   DM (diabetes mellitus), type 2 with renal complications (HCC)   CKD (chronic kidney disease) stage 3, GFR 30-59 ml/min (HCC)   Coronary atherosclerosis of native coronary artery   Malnutrition of moderate degree   Dementia with behavioral disturbance [facility])   Hospital Course: 77yo with h/o PAF, HLD, and hypothyroidism who presented on 10/19 with cough and generalized weakness, found to have LLL PNA.  She was started on Ceftriaxone and Azithromycin.  Despite treatment, she has had worsening delirium.   Resumed opiates, better intermittently.  Palliative care involved, GOC conversation led to plan for home with hospice on 10/28.  Assessment and Plan:  End of life care After further discussion with her daughter on 10/26, her mother is suffering and this is not what she would want Family has decided to proceed with comfort care only Comfort care order set utilized Pain control with morphine, may need transition to drip based on chronic pain Somewhat improved/stable on 10/27 and so family now plans to take her home with home hospice on 10/28   Multifocal PNA Started on IV rocephin and IV zithromax, continue with mucinex Improving symptoms, on room air with periodic need for [place] O2  supplementation for comfort more than apparent hypoxia Duonebs as needed Started on claritin for congestion Strep pneumo antigen is negative Improving with IV Antibiotics, completed on 10/23 She did have an abnormal chest CT in 02/2023 at [facility] with findings "highly concerning for primary lung malignancy"; her daughter reports that she was unaware of this Repeat chest CT on 10/23 was reassuring with multifocal PNA without apparent malignancy   Dementia, now with delirium Recent onset, rapidly progressive according to her daughter - much worse in the last week or two, per daughter Hypersomnolent 10/23 - possibly due to medications and sleep cycle disturbance  More aware 10/24 but quite confused with some apparent visual hallucinations Frankly agitated 10/25 More calm on 10/26 with perseverance in speech Pleasant and oriented to person/place/time but still confused on 10/27 Likely due to hospital delirium  Pt started on hydroxyzine and haldol without any improvement Added zyprexa There may also be a component of opiate withdrawal - not listed on med rec but daughter reported chronic use to palliative team and this is on her PDMP report; added standing oxy 5 mg as well as prn oxy; her current improvement may be related to resumption or may be transient, will continue to monitor She was recommended for [facility] and then SNF at discharge but current plan is for discharge to home with hospice on 10/28 given her transition to comfort only Currently with telemedicine sitter but much more calm this AM so stopped this Head CT unremarkable on 10/25 Palliative care consulted   Atrial fibrillation Rate controlled with metoprolol - stop with transition to comfort Not on  Physician Discharge Summary   Patient: Tammy Suarez MRN: [id number] DOB: 1945/12/19  Admit date:     09/04/2023  Discharge date: 09/13/23  Discharge Physician: Jonah Blue   PCP: Kerri Perches, MD   Recommendations at discharge:   You are being discharged home under the care of hospice Medications have been stopped other than for comfort Continue home Percocet if needed for pain Zyprexa is also ordered  Should home medications be needed, please discuss with hospice doctor and/or Dr. Lodema Hong  Discharge Diagnoses: Principal Problem:   Multifocal pneumonia Active Problems:   Hypothyroidism   Hyperlipidemia with target LDL less than 70   Obesity (BMI 30.0-34.9)   Essential hypertension, benign   DM (diabetes mellitus), type 2 with renal complications (HCC)   CKD (chronic kidney disease) stage 3, GFR 30-59 ml/min (HCC)   Coronary atherosclerosis of native coronary artery   Malnutrition of moderate degree   Dementia with behavioral disturbance [facility])   Hospital Course: 77yo with h/o PAF, HLD, and hypothyroidism who presented on 10/19 with cough and generalized weakness, found to have LLL PNA.  She was started on Ceftriaxone and Azithromycin.  Despite treatment, she has had worsening delirium.   Resumed opiates, better intermittently.  Palliative care involved, GOC conversation led to plan for home with hospice on 10/28.  Assessment and Plan:  End of life care After further discussion with her daughter on 10/26, her mother is suffering and this is not what she would want Family has decided to proceed with comfort care only Comfort care order set utilized Pain control with morphine, may need transition to drip based on chronic pain Somewhat improved/stable on 10/27 and so family now plans to take her home with home hospice on 10/28   Multifocal PNA Started on IV rocephin and IV zithromax, continue with mucinex Improving symptoms, on room air with periodic need for [place] O2  supplementation for comfort more than apparent hypoxia Duonebs as needed Started on claritin for congestion Strep pneumo antigen is negative Improving with IV Antibiotics, completed on 10/23 She did have an abnormal chest CT in 02/2023 at [facility] with findings "highly concerning for primary lung malignancy"; her daughter reports that she was unaware of this Repeat chest CT on 10/23 was reassuring with multifocal PNA without apparent malignancy   Dementia, now with delirium Recent onset, rapidly progressive according to her daughter - much worse in the last week or two, per daughter Hypersomnolent 10/23 - possibly due to medications and sleep cycle disturbance  More aware 10/24 but quite confused with some apparent visual hallucinations Frankly agitated 10/25 More calm on 10/26 with perseverance in speech Pleasant and oriented to person/place/time but still confused on 10/27 Likely due to hospital delirium  Pt started on hydroxyzine and haldol without any improvement Added zyprexa There may also be a component of opiate withdrawal - not listed on med rec but daughter reported chronic use to palliative team and this is on her PDMP report; added standing oxy 5 mg as well as prn oxy; her current improvement may be related to resumption or may be transient, will continue to monitor She was recommended for [facility] and then SNF at discharge but current plan is for discharge to home with hospice on 10/28 given her transition to comfort only Currently with telemedicine sitter but much more calm this AM so stopped this Head CT unremarkable on 10/25 Palliative care consulted   Atrial fibrillation Rate controlled with metoprolol - stop with transition to comfort Not on  Physician Discharge Summary   Patient: Tammy Suarez MRN: [id number] DOB: 1945/12/19  Admit date:     09/04/2023  Discharge date: 09/13/23  Discharge Physician: Jonah Blue   PCP: Kerri Perches, MD   Recommendations at discharge:   You are being discharged home under the care of hospice Medications have been stopped other than for comfort Continue home Percocet if needed for pain Zyprexa is also ordered  Should home medications be needed, please discuss with hospice doctor and/or Dr. Lodema Hong  Discharge Diagnoses: Principal Problem:   Multifocal pneumonia Active Problems:   Hypothyroidism   Hyperlipidemia with target LDL less than 70   Obesity (BMI 30.0-34.9)   Essential hypertension, benign   DM (diabetes mellitus), type 2 with renal complications (HCC)   CKD (chronic kidney disease) stage 3, GFR 30-59 ml/min (HCC)   Coronary atherosclerosis of native coronary artery   Malnutrition of moderate degree   Dementia with behavioral disturbance [facility])   Hospital Course: 77yo with h/o PAF, HLD, and hypothyroidism who presented on 10/19 with cough and generalized weakness, found to have LLL PNA.  She was started on Ceftriaxone and Azithromycin.  Despite treatment, she has had worsening delirium.   Resumed opiates, better intermittently.  Palliative care involved, GOC conversation led to plan for home with hospice on 10/28.  Assessment and Plan:  End of life care After further discussion with her daughter on 10/26, her mother is suffering and this is not what she would want Family has decided to proceed with comfort care only Comfort care order set utilized Pain control with morphine, may need transition to drip based on chronic pain Somewhat improved/stable on 10/27 and so family now plans to take her home with home hospice on 10/28   Multifocal PNA Started on IV rocephin and IV zithromax, continue with mucinex Improving symptoms, on room air with periodic need for [place] O2  supplementation for comfort more than apparent hypoxia Duonebs as needed Started on claritin for congestion Strep pneumo antigen is negative Improving with IV Antibiotics, completed on 10/23 She did have an abnormal chest CT in 02/2023 at [facility] with findings "highly concerning for primary lung malignancy"; her daughter reports that she was unaware of this Repeat chest CT on 10/23 was reassuring with multifocal PNA without apparent malignancy   Dementia, now with delirium Recent onset, rapidly progressive according to her daughter - much worse in the last week or two, per daughter Hypersomnolent 10/23 - possibly due to medications and sleep cycle disturbance  More aware 10/24 but quite confused with some apparent visual hallucinations Frankly agitated 10/25 More calm on 10/26 with perseverance in speech Pleasant and oriented to person/place/time but still confused on 10/27 Likely due to hospital delirium  Pt started on hydroxyzine and haldol without any improvement Added zyprexa There may also be a component of opiate withdrawal - not listed on med rec but daughter reported chronic use to palliative team and this is on her PDMP report; added standing oxy 5 mg as well as prn oxy; her current improvement may be related to resumption or may be transient, will continue to monitor She was recommended for [facility] and then SNF at discharge but current plan is for discharge to home with hospice on 10/28 given her transition to comfort only Currently with telemedicine sitter but much more calm this AM so stopped this Head CT unremarkable on 10/25 Palliative care consulted   Atrial fibrillation Rate controlled with metoprolol - stop with transition to comfort Not on  Lungs/Pleura: Since the previous CT from April, the right upper lung consolidation has mostly resolved. There is a small amount of focal residual or recurrent consolidation in the right upper lung. New area of airspace disease in the left upper lung. Small bilateral pleural effusions with consolidation or atelectasis in the lower lungs, greater on the left, and new since prior study. Changes likely to represent multifocal pneumonia. Mild interstitial changes in the lungs could indicate interstitial pneumonia or edema. Upper Abdomen: A gastric lap band is present. No acute abnormalities. Musculoskeletal: Sternotomy wires. Degenerative changes in the spine. Degenerative  changes in both shoulders with severe degenerative change in the right shoulder. Right shoulder suggest chronic dislocation with bone remodeling and joint effusion/bursal fluid. IMPRESSION: 1. Small bilateral pleural effusions with infiltration or atelectasis in the lung bases, greater on the left. Patchy focal airspace infiltrates in the upper lungs. Probable multifocal pneumonia. 2. Interstitial septal thickening in the lungs likely to represent edema or possibly interstitial pneumonia. 3. Aortic atherosclerosis. 4. Severe asymmetrical degenerative changes in the right shoulder with suggestion of chronic dislocation including bone remodeling and large joint effusion/bursal fluid. Electronically Signed   By: Burman Nieves M.D.   On: 09/08/2023 20:31   ECHOCARDIOGRAM COMPLETE  Result Date: 09/08/2023    ECHOCARDIOGRAM REPORT   Patient Name:   LETTYE BOHLS Date of Exam: 09/08/2023 Medical Rec #:  [id number]     Height:       64.0 in Accession #:    [id number]    Weight:       199.0 lb Date of Birth:  04/21/1946     BSA:          1.952 m Patient Age:    77 years      BP:           162/73 mmHg Patient Gender: F             HR:           54 bpm. Exam Location:  Inpatient Procedure: 2D Echo, Cardiac Doppler and Color Doppler Indications:    Chest Pain R07.9  History:        Patient has prior history of Echocardiogram examinations, most                 recent 11/26/2022. Previous Myocardial Infarction,                 Arrythmias:Atrial Fibrillation; Risk Factors:Hypertension,                 Dyslipidemia and Diabetes.  Sonographer:    Harriette Bouillon RDCS Referring Phys: Herminio Heads IMPRESSIONS  1. Left ventricular ejection fraction, by estimation, is 55 to 60%. The left ventricle has normal function. The left ventricle has no regional wall motion abnormalities. There is mild concentric left ventricular hypertrophy. Left ventricular diastolic parameters were normal.  2. Right ventricular systolic function is  normal. The right ventricular size is normal. There is mildly elevated pulmonary artery systolic pressure. The estimated right ventricular systolic pressure is 36.4 mmHg.  3. Left atrial size was moderately dilated.  4. The mitral valve is degenerative. Moderate mitral valve regurgitation.  5. The aortic valve is tricuspid. Aortic valve regurgitation is not visualized.  6. The inferior vena cava is normal in size with greater than 50% respiratory variability, suggesting right atrial pressure of 3 mmHg. Comparison(s): No prior Echocardiogram. FINDINGS  Left Ventricle: Left ventricular ejection fraction, by estimation, is 55 to 60%. The left  Physician Discharge Summary   Patient: Tammy Suarez MRN: [id number] DOB: 1945/12/19  Admit date:     09/04/2023  Discharge date: 09/13/23  Discharge Physician: Jonah Blue   PCP: Kerri Perches, MD   Recommendations at discharge:   You are being discharged home under the care of hospice Medications have been stopped other than for comfort Continue home Percocet if needed for pain Zyprexa is also ordered  Should home medications be needed, please discuss with hospice doctor and/or Dr. Lodema Hong  Discharge Diagnoses: Principal Problem:   Multifocal pneumonia Active Problems:   Hypothyroidism   Hyperlipidemia with target LDL less than 70   Obesity (BMI 30.0-34.9)   Essential hypertension, benign   DM (diabetes mellitus), type 2 with renal complications (HCC)   CKD (chronic kidney disease) stage 3, GFR 30-59 ml/min (HCC)   Coronary atherosclerosis of native coronary artery   Malnutrition of moderate degree   Dementia with behavioral disturbance [facility])   Hospital Course: 77yo with h/o PAF, HLD, and hypothyroidism who presented on 10/19 with cough and generalized weakness, found to have LLL PNA.  She was started on Ceftriaxone and Azithromycin.  Despite treatment, she has had worsening delirium.   Resumed opiates, better intermittently.  Palliative care involved, GOC conversation led to plan for home with hospice on 10/28.  Assessment and Plan:  End of life care After further discussion with her daughter on 10/26, her mother is suffering and this is not what she would want Family has decided to proceed with comfort care only Comfort care order set utilized Pain control with morphine, may need transition to drip based on chronic pain Somewhat improved/stable on 10/27 and so family now plans to take her home with home hospice on 10/28   Multifocal PNA Started on IV rocephin and IV zithromax, continue with mucinex Improving symptoms, on room air with periodic need for [place] O2  supplementation for comfort more than apparent hypoxia Duonebs as needed Started on claritin for congestion Strep pneumo antigen is negative Improving with IV Antibiotics, completed on 10/23 She did have an abnormal chest CT in 02/2023 at [facility] with findings "highly concerning for primary lung malignancy"; her daughter reports that she was unaware of this Repeat chest CT on 10/23 was reassuring with multifocal PNA without apparent malignancy   Dementia, now with delirium Recent onset, rapidly progressive according to her daughter - much worse in the last week or two, per daughter Hypersomnolent 10/23 - possibly due to medications and sleep cycle disturbance  More aware 10/24 but quite confused with some apparent visual hallucinations Frankly agitated 10/25 More calm on 10/26 with perseverance in speech Pleasant and oriented to person/place/time but still confused on 10/27 Likely due to hospital delirium  Pt started on hydroxyzine and haldol without any improvement Added zyprexa There may also be a component of opiate withdrawal - not listed on med rec but daughter reported chronic use to palliative team and this is on her PDMP report; added standing oxy 5 mg as well as prn oxy; her current improvement may be related to resumption or may be transient, will continue to monitor She was recommended for [facility] and then SNF at discharge but current plan is for discharge to home with hospice on 10/28 given her transition to comfort only Currently with telemedicine sitter but much more calm this AM so stopped this Head CT unremarkable on 10/25 Palliative care consulted   Atrial fibrillation Rate controlled with metoprolol - stop with transition to comfort Not on  Lungs/Pleura: Since the previous CT from April, the right upper lung consolidation has mostly resolved. There is a small amount of focal residual or recurrent consolidation in the right upper lung. New area of airspace disease in the left upper lung. Small bilateral pleural effusions with consolidation or atelectasis in the lower lungs, greater on the left, and new since prior study. Changes likely to represent multifocal pneumonia. Mild interstitial changes in the lungs could indicate interstitial pneumonia or edema. Upper Abdomen: A gastric lap band is present. No acute abnormalities. Musculoskeletal: Sternotomy wires. Degenerative changes in the spine. Degenerative  changes in both shoulders with severe degenerative change in the right shoulder. Right shoulder suggest chronic dislocation with bone remodeling and joint effusion/bursal fluid. IMPRESSION: 1. Small bilateral pleural effusions with infiltration or atelectasis in the lung bases, greater on the left. Patchy focal airspace infiltrates in the upper lungs. Probable multifocal pneumonia. 2. Interstitial septal thickening in the lungs likely to represent edema or possibly interstitial pneumonia. 3. Aortic atherosclerosis. 4. Severe asymmetrical degenerative changes in the right shoulder with suggestion of chronic dislocation including bone remodeling and large joint effusion/bursal fluid. Electronically Signed   By: Burman Nieves M.D.   On: 09/08/2023 20:31   ECHOCARDIOGRAM COMPLETE  Result Date: 09/08/2023    ECHOCARDIOGRAM REPORT   Patient Name:   LETTYE BOHLS Date of Exam: 09/08/2023 Medical Rec #:  [id number]     Height:       64.0 in Accession #:    [id number]    Weight:       199.0 lb Date of Birth:  04/21/1946     BSA:          1.952 m Patient Age:    77 years      BP:           162/73 mmHg Patient Gender: F             HR:           54 bpm. Exam Location:  Inpatient Procedure: 2D Echo, Cardiac Doppler and Color Doppler Indications:    Chest Pain R07.9  History:        Patient has prior history of Echocardiogram examinations, most                 recent 11/26/2022. Previous Myocardial Infarction,                 Arrythmias:Atrial Fibrillation; Risk Factors:Hypertension,                 Dyslipidemia and Diabetes.  Sonographer:    Harriette Bouillon RDCS Referring Phys: Herminio Heads IMPRESSIONS  1. Left ventricular ejection fraction, by estimation, is 55 to 60%. The left ventricle has normal function. The left ventricle has no regional wall motion abnormalities. There is mild concentric left ventricular hypertrophy. Left ventricular diastolic parameters were normal.  2. Right ventricular systolic function is  normal. The right ventricular size is normal. There is mildly elevated pulmonary artery systolic pressure. The estimated right ventricular systolic pressure is 36.4 mmHg.  3. Left atrial size was moderately dilated.  4. The mitral valve is degenerative. Moderate mitral valve regurgitation.  5. The aortic valve is tricuspid. Aortic valve regurgitation is not visualized.  6. The inferior vena cava is normal in size with greater than 50% respiratory variability, suggesting right atrial pressure of 3 mmHg. Comparison(s): No prior Echocardiogram. FINDINGS  Left Ventricle: Left ventricular ejection fraction, by estimation, is 55 to 60%. The left  Physician Discharge Summary   Patient: Tammy Suarez MRN: [id number] DOB: 1945/12/19  Admit date:     09/04/2023  Discharge date: 09/13/23  Discharge Physician: Jonah Blue   PCP: Kerri Perches, MD   Recommendations at discharge:   You are being discharged home under the care of hospice Medications have been stopped other than for comfort Continue home Percocet if needed for pain Zyprexa is also ordered  Should home medications be needed, please discuss with hospice doctor and/or Dr. Lodema Hong  Discharge Diagnoses: Principal Problem:   Multifocal pneumonia Active Problems:   Hypothyroidism   Hyperlipidemia with target LDL less than 70   Obesity (BMI 30.0-34.9)   Essential hypertension, benign   DM (diabetes mellitus), type 2 with renal complications (HCC)   CKD (chronic kidney disease) stage 3, GFR 30-59 ml/min (HCC)   Coronary atherosclerosis of native coronary artery   Malnutrition of moderate degree   Dementia with behavioral disturbance [facility])   Hospital Course: 77yo with h/o PAF, HLD, and hypothyroidism who presented on 10/19 with cough and generalized weakness, found to have LLL PNA.  She was started on Ceftriaxone and Azithromycin.  Despite treatment, she has had worsening delirium.   Resumed opiates, better intermittently.  Palliative care involved, GOC conversation led to plan for home with hospice on 10/28.  Assessment and Plan:  End of life care After further discussion with her daughter on 10/26, her mother is suffering and this is not what she would want Family has decided to proceed with comfort care only Comfort care order set utilized Pain control with morphine, may need transition to drip based on chronic pain Somewhat improved/stable on 10/27 and so family now plans to take her home with home hospice on 10/28   Multifocal PNA Started on IV rocephin and IV zithromax, continue with mucinex Improving symptoms, on room air with periodic need for [place] O2  supplementation for comfort more than apparent hypoxia Duonebs as needed Started on claritin for congestion Strep pneumo antigen is negative Improving with IV Antibiotics, completed on 10/23 She did have an abnormal chest CT in 02/2023 at [facility] with findings "highly concerning for primary lung malignancy"; her daughter reports that she was unaware of this Repeat chest CT on 10/23 was reassuring with multifocal PNA without apparent malignancy   Dementia, now with delirium Recent onset, rapidly progressive according to her daughter - much worse in the last week or two, per daughter Hypersomnolent 10/23 - possibly due to medications and sleep cycle disturbance  More aware 10/24 but quite confused with some apparent visual hallucinations Frankly agitated 10/25 More calm on 10/26 with perseverance in speech Pleasant and oriented to person/place/time but still confused on 10/27 Likely due to hospital delirium  Pt started on hydroxyzine and haldol without any improvement Added zyprexa There may also be a component of opiate withdrawal - not listed on med rec but daughter reported chronic use to palliative team and this is on her PDMP report; added standing oxy 5 mg as well as prn oxy; her current improvement may be related to resumption or may be transient, will continue to monitor She was recommended for [facility] and then SNF at discharge but current plan is for discharge to home with hospice on 10/28 given her transition to comfort only Currently with telemedicine sitter but much more calm this AM so stopped this Head CT unremarkable on 10/25 Palliative care consulted   Atrial fibrillation Rate controlled with metoprolol - stop with transition to comfort Not on

## 2023-09-13 NOTE — Consult Note (Signed)
[facility] Liaison Note  09/13/2023  Tammy Suarez 1946/03/12 [id number]  Location: screened the patient remotely at [facility] ED.  Insurance: [employer] Medicare Advantage   Tammy Suarez is a 77 y.o. female who is a Primary Care Patient of Tammy Perches, MD- [facility]. The patient was screened for readmission hospitalization with noted medium risk score for unplanned readmission risk with 1 IP/1 ED in 6 months.  The patient was assessed for potential Care Management service needs for post hospital transition for care coordination. Review of patient's electronic medical record reveals patient was admitted with Pneumonia. Pt will discharged home with hospice services. No anticipated care management needs at this time.    [facility] Care Management/Population Health does not replace or interfere with any arrangements made by the Inpatient Transition of Care team.   For questions contact:   Tammy Cousin, RN, [facility] Liaison [place]   [facility], Population Health Office Hours MTWF  8:00 am-6:00 pm Direct Dial: [phone] mobile [phone] [Office toll free line] Office Hours are M-F 8:30 - 5 pm [email or web address]

## 2023-09-13 NOTE — TOC Transition Note (Signed)
Transition of Care [facility]) - CM/SW Discharge Note   Patient Details  Name: Tammy Suarez MRN: [id number] Date of Birth: 1946/09/29  Transition of Care [facility]) CM/SW Contact:  Leitha Bleak, RN Phone Number: 09/13/2023, 1:19 PM   Clinical Narrative:   Rae Halsted with [facility] confirmed hospital bed would be delivered today. She spoke with daughter, the bed does not have to be there before the patient. MD/RN updated to discharge patient home with hospice and call her daughter for transport.    Final next level of care: Home w Hospice Care Barriers to Discharge: Barriers Resolved   Patient Goals and CMS Choice CMS Medicare.gov Compare Post Acute Care list provided to:: Patient Represenative (must comment) Choice offered to / list presented to : Adult Children  Discharge Placement       Patient to be transferred to facility by: daughter   Patient and family notified of of transfer: 09/13/23  Discharge Plan and Services Additional resources added to the After Visit Summary for      Post Acute Care Choice: Home Health                HH Arranged: PT, RN, OT [facility] Agency: [facility] Date [facility] Agency Contacted: 09/07/23   Representative spoke with at [facility] Agency: [facility]  Social Determinants of Health (SDOH) Interventions SDOH Screenings   Food Insecurity: No Food Insecurity (09/04/2023)  Housing: Low Risk  (09/04/2023)  Transportation Needs: No Transportation Needs (09/04/2023)  Utilities: Not At Risk (09/04/2023)  Alcohol Screen: Low Risk  (04/22/2023)  Depression (PHQ2-9): High Risk (05/05/2023)  Financial Resource Strain: Low Risk  (04/22/2023)  Recent Concern: Financial Resource Strain - Medium Risk (02/24/2023)   Received from [facility], [facility]  Physical Activity: Inactive (04/22/2023)  Social Connections: Socially Isolated (04/22/2023)  Stress: No Stress Concern Present (04/22/2023)  Tobacco Use: High Risk (09/04/2023)    Readmission Risk  Interventions    09/09/2023   11:24 AM 09/06/2023    1:16 PM  Readmission Risk Prevention Plan  Transportation Screening Complete Complete  PCP or Specialist Appt within 5-7 Days Complete Not Complete  Home Care Screening  Complete  Medication Review (RN CM)  Complete

## 2023-09-13 NOTE — Plan of Care (Signed)
  Problem: Acute Rehab PT Goals(only PT should resolve) Goal: Pt Will Go Supine/Side To Sit Outcome: Adequate for Discharge Goal: Patient Will Transfer Sit To/From Stand Outcome: Adequate for Discharge Goal: Pt Will Transfer Bed To Chair/Chair To Bed Outcome: Adequate for Discharge Goal: Pt Will Ambulate Outcome: Adequate for Discharge   Problem: Acute Rehab OT Goals (only OT should resolve) Goal: Pt. Will Perform Grooming Outcome: Adequate for Discharge Goal: Pt. Will Perform Upper Body Dressing Outcome: Adequate for Discharge Goal: Pt. Will Perform Lower Body Dressing Outcome: Adequate for Discharge Goal: Pt. Will Transfer To Toilet Outcome: Adequate for Discharge Goal: Pt. Will Perform Toileting-Clothing Manipulation Outcome: Adequate for Discharge Goal: Pt/Caregiver Will Perform Home Exercise Program Outcome: Adequate for Discharge   Problem: Education: Goal: Knowledge of the prescribed therapeutic regimen will improve Outcome: Adequate for Discharge   Problem: Coping: Goal: Ability to identify and develop effective coping behavior will improve Outcome: Adequate for Discharge   Problem: Clinical Measurements: Goal: Quality of life will improve Outcome: Adequate for Discharge   Problem: Respiratory: Goal: Verbalizations of increased ease of respirations will increase Outcome: Adequate for Discharge   Problem: Role Relationship: Goal: Family's ability to cope with current situation will improve Outcome: Adequate for Discharge Goal: Ability to verbalize concerns, feelings, and thoughts to partner or family member will improve Outcome: Adequate for Discharge   Problem: Pain Management: Goal: Satisfaction with pain management regimen will improve Outcome: Adequate for Discharge

## 2023-09-13 NOTE — Progress Notes (Addendum)
Palliative:  Noted plans for home with hospice. This plan seems very appropriate. May consider roxanol 5 mg SL every 4 hours PRN severe pain or shortness of breath. May continue home dose PRN OxyIR if able to tolerate and swallow pills. Medication to be provided under the guidance of hospice. I did note history of drug diversion during my chart review - I do not know the details of this but hospice to be aware and manage at home.   No charge  Yong Channel, NP Palliative Medicine Team Pager [phone] (Please see [email or web address] for schedule) Team Phone [phone]

## 2023-09-13 NOTE — Progress Notes (Signed)
   09/13/23 1015  Spiritual Encounters  Type of Visit Initial  Care provided to: Patient  Reason for visit Routine spiritual support  OnCall Visit No   Chaplain visited with the patent during rounding on the unit. The patient, Tammy Suarez, shared that she is looking forward to going home and is waiting on family to return to her room. I wished her safe travels home.   Valerie Roys [facility]  [phone]

## 2023-09-13 NOTE — Plan of Care (Signed)
  Problem: Role Relationship: Goal: Family's ability to cope with current situation will improve Outcome: Not Progressing Goal: Ability to verbalize concerns, feelings, and thoughts to partner or family member will improve Outcome: Not Progressing   Problem: Respiratory: Goal: Verbalizations of increased ease of respirations will increase Outcome: Not Progressing

## 2023-09-13 NOTE — Progress Notes (Signed)
Upon this RN's shift assessment breathing is equal and unlabored. She answered to name.. She is Alert and oriented x 4. BN Xianna Siverling RN

## 2023-09-14 DIAGNOSIS — Z515 Encounter for palliative care: Secondary | ICD-10-CM | POA: Diagnosis not present

## 2023-09-14 NOTE — Progress Notes (Signed)
Pt is screaming and trying to get out of bed refusing to take meds by mouth. Can we get Haldol IM? Thank you.

## 2023-09-14 NOTE — Progress Notes (Signed)
Pt received Haldol IM at 21:46 and rested through the night.

## 2023-09-14 NOTE — Progress Notes (Signed)
Palliative:  HPI:  77 y.o. female  with past medical history of CHF, afib, CAD, CKD, HLD, GERD, depression, chronic arthritic pain admitted on 09/04/2023 with pneumonia.   I met today at Tammy Suarez's bedside along with Dr. Ophelia Charter. She is lethargic. No intake today. Noted intake 0-10% daily. She had severe agitation last night requiring IM Haldol. Agitation noted to be overall improved over course of hospitalization but continues with unpredictable agitation. Transitioned to comfort care over the weekend. Family planning home with hospice but now with concerns for care at home.   I called and spoke with daughter, Tammy Suarez, and then with granddaughter, Tammy Suarez, at [facility] request. We reviewed plans for home with hospice and they share concern that they cannot care for her at home. They are concerned for transport home - I reassure we can arrange transport. We reviewed hospice support at home. Family voices concern for unpredictable agitation and that they do not know how to keep her comfortable at home. [name] mentions rehab and I reviewed goals of care and Tammy Suarez and Tammy Suarez reaffirm full comfort care recognizing that Tammy Suarez does not wish to live this way. I discussed expectation of care with progression towards end of life. We discussed in detail home with hospice, hospice facility, and home with hospice with transition to hospice facility. At this time family would like to pursue hospice facility placement. I do feel that Tammy Suarez's prognosis is < 2 weeks. Occasional symptoms with agitation but severe when occurs.   All questions/concerns addressed. Emotional support provided.  Exam: Sedated. Unable to awaken. No distress. Breathing regular, unlabored. Abd soft. Warm to touch.   Plan: - DNR - Full comfort care - Pursue hospice facility placement  35 min  Yong Channel, NP Palliative Medicine Team Pager [phone] (Please see [email or web address] for schedule) Team Phone [phone]     Greater than 50%  of this time was spent counseling and coordinating care related to the above assessment and plan

## 2023-09-14 NOTE — TOC Progression Note (Signed)
Transition of Care [facility]) - Progression Note    Patient Details  Name: Tammy Suarez MRN: [id number] Date of Birth: 12-09-1945  Transition of Care [facility]) CM/SW Contact  Leitha Bleak, RN Phone Number: 09/14/2023, 4:12 PM  Clinical Narrative:   CM called for an update from [place]. They can not reach daughter, said they were waiting on consent to be sign. I ask .Marland Kitchen So are you accepting her? The answer was we are wanting to see why her daughter would not take her home. Shawn with [facility] assessed, they do not currently have any beds and will follow but does not want to interfere with [facility] taking the patient. TOC to follow up with [facility]. MD/ RN updated    Expected Discharge Plan: Hospice Medical Facility Barriers to Discharge: Other (must enter comment), Family Issues (waiting for Hospice to assess)  Expected Discharge Plan and Services     Post Acute Care Choice: Home Health Living arrangements for the past 2 months: Single Family Home Expected Discharge Date: 09/13/23                      HH Arranged: PT, RN, OT HH Agency: [facility] Date [facility] Agency Contacted: 09/07/23   Representative spoke with at [facility] Agency: Clydie Braun   Social Determinants of Health (SDOH) Interventions SDOH Screenings   Food Insecurity: No Food Insecurity (09/04/2023)  Housing: Low Risk  (09/04/2023)  Transportation Needs: No Transportation Needs (09/04/2023)  Utilities: Not At Risk (09/04/2023)  Alcohol Screen: Low Risk  (04/22/2023)  Depression (PHQ2-9): High Risk (05/05/2023)  Financial Resource Strain: Low Risk  (04/22/2023)  Recent Concern: Financial Resource Strain - Medium Risk (02/24/2023)   Received from [facility], [facility]  Physical Activity: Inactive (04/22/2023)  Social Connections: Socially Isolated (04/22/2023)  Stress: No Stress Concern Present (04/22/2023)  Tobacco Use: High Risk (09/04/2023)    Readmission Risk Interventions    09/09/2023   11:24 AM  09/06/2023    1:16 PM  Readmission Risk Prevention Plan  Transportation Screening Complete Complete  PCP or Specialist Appt within 5-7 Days Complete Not Complete  Home Care Screening  Complete  Medication Review (RN CM)  Complete

## 2023-09-14 NOTE — TOC Progression Note (Signed)
Transition of Care [facility]) - Progression Note    Patient Details  Name: Tammy Suarez MRN: [id number] Date of Birth: April 27, 1946  Transition of Care [facility]) CM/SW Contact  Leitha Bleak, RN Phone Number: 09/14/2023, 11:01 AM  Clinical Narrative:   Daughter did not feel like she could take patient home when she arrived yesterday. Palliative discussion with daughter she is agreeable to residential.  CM spoke with [facility], they do not have any beds and have 3 on the waiting list. CM spoke with her daughter, she is agreeable to [facility] place as well. They live in [place] and it is not that much differences in the drive. Team updated.  TOC waiting for patient to be assessed.    Expected Discharge Plan: Hospice Medical Facility Barriers to Discharge: Other (must enter comment), Family Issues (waiting for Hospice to assess)  Expected Discharge Plan and Services     Post Acute Care Choice: Home Health Living arrangements for the past 2 months: Single Family Home Expected Discharge Date: 09/13/23                     HH Arranged: PT, RN, OT HH Agency: [facility] Date [place] Agency Contacted: 09/07/23   Representative spoke with at [facility] Agency: [facility]  Social Determinants of Health (SDOH) Interventions SDOH Screenings   Food Insecurity: No Food Insecurity (09/04/2023)  Housing: Low Risk  (09/04/2023)  Transportation Needs: No Transportation Needs (09/04/2023)  Utilities: Not At Risk (09/04/2023)  Alcohol Screen: Low Risk  (04/22/2023)  Depression (PHQ2-9): High Risk (05/05/2023)  Financial Resource Strain: Low Risk  (04/22/2023)  Recent Concern: Financial Resource Strain - Medium Risk (02/24/2023)   Received from [facility], [facility]  Physical Activity: Inactive (04/22/2023)  Social Connections: Socially Isolated (04/22/2023)  Stress: No Stress Concern Present (04/22/2023)  Tobacco Use: High Risk (09/04/2023)    Readmission Risk Interventions     09/09/2023   11:24 AM 09/06/2023    1:16 PM  Readmission Risk Prevention Plan  Transportation Screening Complete Complete  PCP or Specialist Appt within 5-7 Days Complete Not Complete  Home Care Screening  Complete  Medication Review (RN CM)  Complete

## 2023-09-14 NOTE — Care Management Important Message (Signed)
Important Message  Patient Details  Name: Tammy Suarez MRN: [id number] Date of Birth: [birth year]   Important Message Given:  Yes - Medicare IM     Corey Harold 09/14/2023, 11:48 AM

## 2023-09-14 NOTE — Progress Notes (Signed)
Patient was discharged yesterday, home with hospice.  Family had been in agreement with this plan but then refused to take her home even after hospital bed was delivered.  Seen and appears to be appropriate for residential hospice at this time - she is eating about 10% of meals and requiring medications for agitation.  Vitals reviewed, generally stable (with HTN).  Patient with blunted affect, generally pleasant, not particularly engaged today.  Exam unchanged.  She is now considered appropriate for residential hospice and appears to have an available bed and so will be transferred today.  Georgana Curio, M.D.

## 2023-09-14 NOTE — TOC Progression Note (Signed)
Transition of Care [facility]) - Progression Note    Patient Details  Name: Tammy Suarez MRN: [id number] Date of Birth: 05-22-1946  Transition of Care [facility]) CM/SW Contact  Leitha Bleak, RN Phone Number: 09/14/2023, 2:46 PM  Clinical Narrative:   [facility] sent RN to assess.  AP RN said they had to review and will let us know. CM is asking for an updated no responds, [facility] is aware family is agreeable to [place] [facility] as well. TOC follow to update Misty with [facility].  Expected Discharge Plan: Hospice Medical Facility Barriers to Discharge: Other (must enter comment), Family Issues (waiting for Hospice to assess)  Expected Discharge Plan and Services     Post Acute Care Choice: Home Health Living arrangements for the past 2 months: Single Family Home Expected Discharge Date: 09/13/23                         HH Arranged: PT, RN, OT HH Agency: [facility] Date [place] Agency Contacted: 09/07/23   Representative spoke with at [facility] Agency: [facility]   Social Determinants of Health (SDOH) Interventions SDOH Screenings   Food Insecurity: No Food Insecurity (09/04/2023)  Housing: Low Risk  (09/04/2023)  Transportation Needs: No Transportation Needs (09/04/2023)  Utilities: Not At Risk (09/04/2023)  Alcohol Screen: Low Risk  (04/22/2023)  Depression (PHQ2-9): High Risk (05/05/2023)  Financial Resource Strain: Low Risk  (04/22/2023)  Recent Concern: Financial Resource Strain - Medium Risk (02/24/2023)   Received from [facility], [facility]  Physical Activity: Inactive (04/22/2023)  Social Connections: Socially Isolated (04/22/2023)  Stress: No Stress Concern Present (04/22/2023)  Tobacco Use: High Risk (09/04/2023)    Readmission Risk Interventions    09/09/2023   11:24 AM 09/06/2023    1:16 PM  Readmission Risk Prevention Plan  Transportation Screening Complete Complete  PCP or Specialist Appt within 5-7 Days Complete Not Complete  Home Care Screening  Complete   Medication Review (RN CM)  Complete

## 2023-09-15 ENCOUNTER — Encounter: Payer: Medicare HMO | Admitting: Family Medicine

## 2023-09-15 DIAGNOSIS — J189 Pneumonia, unspecified organism: Secondary | ICD-10-CM | POA: Diagnosis not present

## 2023-09-15 MED ORDER — MORPHINE SULFATE (CONCENTRATE) 10 MG/0.5ML PO SOLN
5.0000 mg | ORAL | Status: DC | PRN
Start: 2023-09-15 — End: 2023-09-16
  Administered 2023-09-16: 5 mg via ORAL
  Filled 2023-09-15: qty 0.5

## 2023-09-15 MED ORDER — BISACODYL 10 MG RE SUPP
10.0000 mg | Freq: Once | RECTAL | Status: DC
  Filled 2023-09-15: qty 1

## 2023-09-15 MED ORDER — LORAZEPAM 1 MG PO TABS: 1.0000 mg | ORAL_TABLET | ORAL | Status: DC | PRN

## 2023-09-15 MED ORDER — LORAZEPAM 1 MG PO TABS
1.0000 mg | ORAL_TABLET | ORAL | Status: DC | PRN
Start: 2023-09-15 — End: 2023-09-16
  Administered 2023-09-15 – 2023-09-16 (×2): 1 mg via ORAL
  Filled 2023-09-15 (×2): qty 1

## 2023-09-15 MED ORDER — MORPHINE SULFATE (CONCENTRATE) 10 MG/0.5ML PO SOLN: 5.0000 mg | ORAL | Status: DC | PRN

## 2023-09-15 NOTE — TOC Progression Note (Signed)
Transition of Care [facility]) - Progression Note    Patient Details  Name: Tammy Suarez MRN: [id number] Date of Birth: [birth year]  Transition of Care [facility]) CM/SW Contact  Elliot Gault, LCSW Phone Number: 09/15/2023, 2:13 PM  Clinical Narrative:     TOC following. [facility] RN states that pt does not meet criteria for Residential Hospice. TOC and [facility] RN have spoken with pt's daughter to present options and she has agreed to have pt return home with continued hospice care at home. She would prefer dc tomorrow AM.  At request of hospice team, asked MD to please have foley catheter placed, and also to order liquid Morphine and Liquid Ativan for dc.   TOC will follow up in AM.  Expected Discharge Plan: Hospice Medical Facility Barriers to Discharge: Other (must enter comment), Family Issues (waiting for Hospice to assess)  Expected Discharge Plan and Services     Post Acute Care Choice: Home Health Living arrangements for the past 2 months: Single Family Home Expected Discharge Date: 09/13/23                         HH Arranged: PT, RN, OT HH Agency: [facility] Date [facility] Agency Contacted: 09/07/23   Representative spoke with at [facility] Agency: [facility]   Social Determinants of Health (SDOH) Interventions SDOH Screenings   Food Insecurity: No Food Insecurity (09/04/2023)  Housing: Low Risk  (09/04/2023)  Transportation Needs: No Transportation Needs (09/04/2023)  Utilities: Not At Risk (09/04/2023)  Alcohol Screen: Low Risk  (04/22/2023)  Depression (PHQ2-9): High Risk (05/05/2023)  Financial Resource Strain: Low Risk  (04/22/2023)  Recent Concern: Financial Resource Strain - Medium Risk (02/24/2023)   Received from [facility], [facility]  Physical Activity: Inactive (04/22/2023)  Social Connections: Socially Isolated (04/22/2023)  Stress: No Stress Concern Present (04/22/2023)  Tobacco Use: High Risk (09/04/2023)    Readmission Risk Interventions     09/09/2023   11:24 AM 09/06/2023    1:16 PM  Readmission Risk Prevention Plan  Transportation Screening Complete Complete  PCP or Specialist Appt within 5-7 Days Complete Not Complete  Home Care Screening  Complete  Medication Review (RN CM)  Complete

## 2023-09-15 NOTE — Progress Notes (Signed)
14 Fr foley placed by this RN and Merry Proud , Charity fundraiser . Patient tolerated well, 100cc of amber colored urine flowed by gravity to foley bag. Foley anchor to left thigh,

## 2023-09-15 NOTE — Progress Notes (Addendum)
Progress Note   Patient: Tammy Suarez [id number] DOB: 09-25-1946 DOA: 09/04/2023     11 DOS: the patient was seen and examined on 09/15/2023   Brief hospital course: 77yo with h/o PAF, HLD, and hypothyroidism who presented on 10/19 with cough and generalized weakness, found to have LLL PNA.  She was started on Ceftriaxone and Azithromycin.  Despite treatment, she has had worsening delirium.   Resumed opiates, better intermittently.  Palliative care involved, GOC conversation led to plan for home with hospice on 10/28.  Family then decided not to take home and patient to be reviewed for residential hospice.   Assessment and Plan:  End of life care Per Dr. Ophelia Charter: After further discussion with her daughter on 10/26, her mother is suffering and this is not what she would want Family has decided to proceed with comfort care only Comfort care order set utilized Pain control with morphine, may need transition to drip based on chronic pain Somewhat improved/stable on 10/27 and so family planned to take her home with home hospice on 10/28 but then changed their mind-- now being considered for residential hospice   Multifocal PNA Improving symptoms, on room air with periodic need for [place]ma O2 supplementation for comfort more than apparent hypoxia Duonebs as needed Started on claritin for congestion Strep pneumo antigen is negative iV Antibiotics, completed on 10/23 She did have an abnormal chest CT in 02/2023 at [facility] with findings "highly concerning for primary lung malignancy"; her daughter reports that she was unaware of this Repeat chest CT on 10/23 was reassuring with multifocal PNA without apparent malignancy   Dementia, now with delirium Recent onset, rapidly progressive according to her daughter - much worse in the last week or two, per daughter Hypersomnolent 10/23 - possibly due to medications and sleep cycle disturbance  More aware 10/24 but quite confused with some apparent visual  hallucinations Frankly agitated 10/25 More calm on 10/26 with perseverance in speech Pleasant and oriented to person/place/time but still confused on 10/27 Likely due to hospital delirium  Pt started on hydroxyzine and haldol without any improvement Added zyprexa There may also be a component of opiate withdrawal - not listed on med rec but daughter reported chronic use to palliative team and this is on her PDMP report; added standing oxy 5 mg as well as prn oxy; her current improvement may be related to resumption or may be transient, will continue to monitor She was recommended for [facility] and then SNF at discharge but current plan is for discharge to home with hospice on 10/28 given her transition to comfort only Head CT unremarkable on 10/25 Palliative care consulted   Atrial fibrillation Rate controlled with metoprolol - stop with transition to comfort Not on [facility] due to questionable diagnosis    Hypertension Stop Toprol XL, losartan as now comfort care   Hyperlipidemia Will stop rosuvastatin and Zetia   Stage 3b CKD Thought to be AKI on presentation but appears to be stable and at/near baseline   Macrocytic anemia Appears to be stable   Thrombocytopenia Also stable   Hypomagnesemia Repleted   Chest pain S/p CABG 2014 Reported chest pain early in hospitalization, now apparently resolved With mildly elevated troponin of 75 and 76 EKG shows non specific st t wave changes, and t wave inversions Prior echo in 02/2023 was unremarkable  Repeat echo is ordered and also unremarkable Cardiology consulted and is not planning ischemic evaluation   Hypothyroidism Stop Synthroid with transition to comfort care  Moderate malnutrition Nutrition Problem: Moderate Malnutrition Etiology: chronic illness (CHF) Signs/Symptoms: mild fat depletion, mild muscle depletion Interventions: Ensure Enlive (each supplement provides 350kcal and 20 grams of protein), MVI   Obesity Body mass index  is 34.16 kg/m.Marland Kitchen      Consultants: Palliative care PT/OT TOC team      Antibiotics: Ceftriaxone 10/19-23 Azithromycin 10/19-23     Subjective: sleepy-- not hungry   Objective: Vitals:   09/13/23 1647 09/13/23 2020  BP: (!) 176/56 (!) 160/69  Pulse: 60 70  Resp:    Temp:  97.8 F (36.6 C)  SpO2:  96%    Intake/Output Summary (Last 24 hours) at 09/15/2023 1119 Last data filed at 09/14/2023 1800 Gross per 24 hour  Intake 120 ml  Output --  Net 120 ml   Filed Weights   09/04/23 1232  Weight: 90.3 kg    Exam:  In bed, appears comfortable-- sleeping-- will awaken and mumble a few words but falls back asleep  Data Reviewed: I have reviewed the patient's lab results since admission.  Pertinent labs for today include:  None today       Disposition: Status is: Inpatient Remains inpatient appropriate because: unsafe disposition   Author: Joseph Art, DO 09/15/2023 11:19 AM  For on call review [email or web address].

## 2023-09-16 DIAGNOSIS — J189 Pneumonia, unspecified organism: Secondary | ICD-10-CM | POA: Diagnosis not present

## 2023-09-16 MED ORDER — BISACODYL 10 MG RE SUPP: 10.0000 mg | Freq: Every day | RECTAL | 0 refills | Status: DC | PRN

## 2023-09-16 MED ORDER — OLANZAPINE 2.5 MG PO TABS: 2.5000 mg | ORAL_TABLET | Freq: Every day | ORAL | 0 refills | Status: DC

## 2023-09-16 MED ORDER — MORPHINE SULFATE (CONCENTRATE) 10 MG/0.5ML PO SOLN: 5.0000 mg | ORAL | 0 refills | Status: DC | PRN

## 2023-09-16 MED ORDER — LORAZEPAM 1 MG PO TABS: 1.0000 mg | ORAL_TABLET | ORAL | 0 refills | Status: DC | PRN

## 2023-09-16 NOTE — Plan of Care (Signed)
  Problem: Clinical Measurements: Goal: Quality of life will improve Outcome: Progressing   Problem: Pain Management: Goal: Satisfaction with pain management regimen will improve Outcome: Progressing   

## 2023-09-16 NOTE — TOC Transition Note (Signed)
Transition of Care [facility]) - CM/SW Discharge Note   Patient Details  Name: Tammy Suarez MRN: [id number] Date of Birth: 03-21-1946  Transition of [facility]) CM/SW Contact:  Elliot Gault, LCSW Phone Number: 09/16/2023, 10:08 AM   Clinical Narrative:     Pt stable for dc per MD. Updated pt's daughter who remains in agreement with plan for dc home with continued hospice care from [place].   As requested, foley cath was placed. Meds were ordered to [facility] and they will deliver to the home.   EMS arranged. Updated Keka at [facility] and they will follow up with dtr to arrange visits.  No other TOC needs for dc.  Final next level of care: Home w Hospice Care Barriers to Discharge: Barriers Resolved   Patient Goals and CMS Choice CMS Medicare.gov Compare Post Acute Care list provided to:: Patient Represenative (must comment) Choice offered to / list presented to : Adult Children  Discharge Placement                  Patient to be transferred to facility by: daughter   Patient and family notified of of transfer: 09/13/23  Discharge Plan and Services Additional resources added to the After Visit Summary for   In-house Referral: Clinical Social Work   Post Acute Care Choice: Hospice, Resumption of Svcs/PTA Provider                    HH Arranged: PT, RN, OT [facility] Agency: [facility] Date [facility] Agency Contacted: 09/07/23   Representative spoke with at [facility] Agency: Clydie Braun  Social Determinants of Health (SDOH) Interventions SDOH Screenings   Food Insecurity: No Food Insecurity (09/04/2023)  Housing: Low Risk  (09/04/2023)  Transportation Needs: No Transportation Needs (09/04/2023)  Utilities: Not At Risk (09/04/2023)  Alcohol Screen: Low Risk  (04/22/2023)  Depression (PHQ2-9): High Risk (05/05/2023)  Financial Resource Strain: Low Risk  (04/22/2023)  Recent Concern: Financial Resource Strain - Medium Risk (02/24/2023)   Received from [facility], [facility]  Physical Activity: Inactive (04/22/2023)  Social Connections: Socially Isolated (04/22/2023)  Stress: No Stress Concern Present (04/22/2023)  Tobacco Use: High Risk (09/04/2023)     Readmission Risk Interventions    09/09/2023   11:24 AM 09/06/2023    1:16 PM  Readmission Risk Prevention Plan  Transportation Screening Complete Complete  PCP or Specialist Appt within 5-7 Days Complete Not Complete  Home Care Screening  Complete  Medication Review (RN CM)  Complete

## 2023-09-16 NOTE — Discharge Summary (Signed)
Physician Discharge Summary   Patient: Tammy Suarez MRN: [id number] DOB: 1946/03/22  Admit date:     09/04/2023  Discharge date: 09/16/23  Discharge Physician: Joseph Art   PCP: Kerri Perches, MD   Recommendations at discharge:   You are being discharged home under the care of hospice   Discharge Diagnoses: Principal Problem:   Multifocal pneumonia Active Problems:   Hypothyroidism   Hyperlipidemia with target LDL less than 70   Obesity (BMI 30.0-34.9)   Essential hypertension, benign   DM (diabetes mellitus), type 2 with renal complications (HCC)   CKD (chronic kidney disease) stage 3, GFR 30-59 ml/min (HCC)   Coronary atherosclerosis of native coronary artery   Malnutrition of moderate degree   Dementia with behavioral disturbance [facility])   Hospital Course: 77yo with h/o PAF, HLD, and hypothyroidism who presented on 10/19 with cough and generalized weakness, found to have LLL PNA.  She was started on Ceftriaxone and Azithromycin.  Despite treatment, she has had worsening delirium.   Resumed opiates, better intermittently.  Palliative care involved, GOC conversation led to plan for home with hospice on 10/31  Assessment and Plan:  End of life care After further discussion with her daughter on 10/26, her mother is suffering and this is not what she would want Family has decided to proceed with comfort care only Prn SL morphine   Multifocal PNA Started on IV rocephin and IV zithromax, continue with mucinex Improving symptoms, on room air with periodic need for [place] O2 supplementation for comfort more than apparent hypoxia Duonebs as needed Started on claritin for congestion Strep pneumo antigen is negative Improving with IV Antibiotics, completed on 10/23 She did have an abnormal chest CT in 02/2023 at [facility] with findings "highly concerning for primary lung malignancy"; her daughter reports that she was unaware of this Repeat chest CT on 10/23 was reassuring with  multifocal PNA without apparent malignancy   Dementia, now with delirium Recent onset, rapidly progressive according to her daughter - much worse in the last week or two, per daughter Hypersomnolent 10/23 - possibly due to medications and sleep cycle disturbance  More aware 10/24 but quite confused with some apparent visual hallucinations Frankly agitated 10/25 More calm on 10/26 with perseverance in speech Pleasant and oriented to person/place/time but still confused on 10/27 Likely due to hospital delirium  Pt started on hydroxyzine and haldol without any improvement Added zyprexa Head CT unremarkable on 10/25   Atrial fibrillation Rate controlled with metoprolol - stop with transition to comfort Not on AC due to questionable diagnosis    Hypertension Stop Toprol XL, losartan   Hyperlipidemia Will stop rosuvastatin and Zetia   Stage 3b CKD Thought to be AKI on presentation but appears to be stable and at/near baseline   Macrocytic anemia Appears to be stable   Thrombocytopenia Also stable   Hypomagnesemia Repleted   Chest pain S/p CABG 2014 Reported chest pain early in hospitalization, now apparently resolved With mildly elevated troponin of 75 and 76 EKG shows non specific st t wave changes, and t wave inversions Prior echo in 02/2023 was unremarkable  Repeat echo is ordered and also unremarkable Cardiology consulted and is not planning ischemic evaluation   Hypothyroidism -synthroid   Moderate malnutrition Nutrition Problem: Moderate Malnutrition Etiology: chronic illness (CHF) Signs/Symptoms: mild fat depletion, mild muscle depletion Interventions: Ensure Enlive (each supplement provides 350kcal and 20 grams of protein), MVI   Obesity Body mass index is 34.16 kg/m.Marland Kitchen  Weight loss should  Physician Discharge Summary   Patient: Tammy Suarez MRN: [id number] DOB: 1946/03/22  Admit date:     09/04/2023  Discharge date: 09/16/23  Discharge Physician: Joseph Art   PCP: Kerri Perches, MD   Recommendations at discharge:   You are being discharged home under the care of hospice   Discharge Diagnoses: Principal Problem:   Multifocal pneumonia Active Problems:   Hypothyroidism   Hyperlipidemia with target LDL less than 70   Obesity (BMI 30.0-34.9)   Essential hypertension, benign   DM (diabetes mellitus), type 2 with renal complications (HCC)   CKD (chronic kidney disease) stage 3, GFR 30-59 ml/min (HCC)   Coronary atherosclerosis of native coronary artery   Malnutrition of moderate degree   Dementia with behavioral disturbance [facility])   Hospital Course: 77yo with h/o PAF, HLD, and hypothyroidism who presented on 10/19 with cough and generalized weakness, found to have LLL PNA.  She was started on Ceftriaxone and Azithromycin.  Despite treatment, she has had worsening delirium.   Resumed opiates, better intermittently.  Palliative care involved, GOC conversation led to plan for home with hospice on 10/31  Assessment and Plan:  End of life care After further discussion with her daughter on 10/26, her mother is suffering and this is not what she would want Family has decided to proceed with comfort care only Prn SL morphine   Multifocal PNA Started on IV rocephin and IV zithromax, continue with mucinex Improving symptoms, on room air with periodic need for [place] O2 supplementation for comfort more than apparent hypoxia Duonebs as needed Started on claritin for congestion Strep pneumo antigen is negative Improving with IV Antibiotics, completed on 10/23 She did have an abnormal chest CT in 02/2023 at [facility] with findings "highly concerning for primary lung malignancy"; her daughter reports that she was unaware of this Repeat chest CT on 10/23 was reassuring with  multifocal PNA without apparent malignancy   Dementia, now with delirium Recent onset, rapidly progressive according to her daughter - much worse in the last week or two, per daughter Hypersomnolent 10/23 - possibly due to medications and sleep cycle disturbance  More aware 10/24 but quite confused with some apparent visual hallucinations Frankly agitated 10/25 More calm on 10/26 with perseverance in speech Pleasant and oriented to person/place/time but still confused on 10/27 Likely due to hospital delirium  Pt started on hydroxyzine and haldol without any improvement Added zyprexa Head CT unremarkable on 10/25   Atrial fibrillation Rate controlled with metoprolol - stop with transition to comfort Not on AC due to questionable diagnosis    Hypertension Stop Toprol XL, losartan   Hyperlipidemia Will stop rosuvastatin and Zetia   Stage 3b CKD Thought to be AKI on presentation but appears to be stable and at/near baseline   Macrocytic anemia Appears to be stable   Thrombocytopenia Also stable   Hypomagnesemia Repleted   Chest pain S/p CABG 2014 Reported chest pain early in hospitalization, now apparently resolved With mildly elevated troponin of 75 and 76 EKG shows non specific st t wave changes, and t wave inversions Prior echo in 02/2023 was unremarkable  Repeat echo is ordered and also unremarkable Cardiology consulted and is not planning ischemic evaluation   Hypothyroidism -synthroid   Moderate malnutrition Nutrition Problem: Moderate Malnutrition Etiology: chronic illness (CHF) Signs/Symptoms: mild fat depletion, mild muscle depletion Interventions: Ensure Enlive (each supplement provides 350kcal and 20 grams of protein), MVI   Obesity Body mass index is 34.16 kg/m.Marland Kitchen  Weight loss should  exposure control, adjustment of the mA and/or kV according to patient size and/or use of iterative reconstruction technique. COMPARISON:  05/27/2022 CT head FINDINGS: Brain: No evidence of acute infarction, hemorrhage, mass, mass effect, or midline shift. No hydrocephalus or extra-axial fluid collection. Periventricular white matter changes, likely the sequela of chronic small vessel ischemic disease. Vascular: No hyperdense vessel. Atherosclerotic calcifications in the intracranial carotid and vertebral arteries. Skull: Negative for fracture or focal lesion. Sinuses/Orbits: Clear paranasal sinuses. No acute finding in the orbits. Redemonstrated  calcifications along the posterior right globe. Other: The mastoid air cells are well aerated. IMPRESSION: No acute intracranial process. Electronically Signed   By: Wiliam Ke M.D.   On: 09/10/2023 15:28   CT CHEST W CONTRAST  Result Date: 09/08/2023 CLINICAL DATA:  Follow-up lung nodule greater than 1 cm. Abnormal x-ray. Previous radiograph demonstrated new area of atelectasis or consolidation in the left lung. EXAM: CT CHEST WITH CONTRAST TECHNIQUE: Multidetector CT imaging of the chest was performed during intravenous contrast administration. RADIATION DOSE REDUCTION: This exam was performed according to the departmental dose-optimization program which includes automated exposure control, adjustment of the mA and/or kV according to patient size and/or use of iterative reconstruction technique. CONTRAST:  60mL OMNIPAQUE IOHEXOL 300 MG/ML  SOLN COMPARISON:  Chest radiograph 09/04/2023.  CT chest 02/23/2023 FINDINGS: Cardiovascular: Postoperative changes in the mediastinum consistent with coronary bypass. Cardiac enlargement. No pericardial effusions. Normal caliber thoracic aorta. No aortic dissection. Great vessel origins are patent. Mediastinum/Nodes: Esophagus is decompressed. Mediastinal lymph nodes are unchanged since prior study without pathologic enlargement, likely reactive. Thyroid gland is unremarkable. Lungs/Pleura: Since the previous CT from April, the right upper lung consolidation has mostly resolved. There is a small amount of focal residual or recurrent consolidation in the right upper lung. New area of airspace disease in the left upper lung. Small bilateral pleural effusions with consolidation or atelectasis in the lower lungs, greater on the left, and new since prior study. Changes likely to represent multifocal pneumonia. Mild interstitial changes in the lungs could indicate interstitial pneumonia or edema. Upper Abdomen: A gastric lap band is present. No acute abnormalities.  Musculoskeletal: Sternotomy wires. Degenerative changes in the spine. Degenerative changes in both shoulders with severe degenerative change in the right shoulder. Right shoulder suggest chronic dislocation with bone remodeling and joint effusion/bursal fluid. IMPRESSION: 1. Small bilateral pleural effusions with infiltration or atelectasis in the lung bases, greater on the left. Patchy focal airspace infiltrates in the upper lungs. Probable multifocal pneumonia. 2. Interstitial septal thickening in the lungs likely to represent edema or possibly interstitial pneumonia. 3. Aortic atherosclerosis. 4. Severe asymmetrical degenerative changes in the right shoulder with suggestion of chronic dislocation including bone remodeling and large joint effusion/bursal fluid. Electronically Signed   By: Burman Nieves M.D.   On: 09/08/2023 20:31   ECHOCARDIOGRAM COMPLETE  Result Date: 09/08/2023    ECHOCARDIOGRAM REPORT   Patient Name:   KADIENCE HOLBERT Date of Exam: 09/08/2023 Medical Rec #:  [id number]     Height:       64.0 in Accession #:    [id number]    Weight:       199.0 lb Date of Birth:  [birth year]     BSA:          1.952 m Patient Age:    77 years      BP:           162/73 mmHg Patient Gender: F  Physician Discharge Summary   Patient: Tammy Suarez MRN: [id number] DOB: 1946/03/22  Admit date:     09/04/2023  Discharge date: 09/16/23  Discharge Physician: Joseph Art   PCP: Kerri Perches, MD   Recommendations at discharge:   You are being discharged home under the care of hospice   Discharge Diagnoses: Principal Problem:   Multifocal pneumonia Active Problems:   Hypothyroidism   Hyperlipidemia with target LDL less than 70   Obesity (BMI 30.0-34.9)   Essential hypertension, benign   DM (diabetes mellitus), type 2 with renal complications (HCC)   CKD (chronic kidney disease) stage 3, GFR 30-59 ml/min (HCC)   Coronary atherosclerosis of native coronary artery   Malnutrition of moderate degree   Dementia with behavioral disturbance [facility])   Hospital Course: 77yo with h/o PAF, HLD, and hypothyroidism who presented on 10/19 with cough and generalized weakness, found to have LLL PNA.  She was started on Ceftriaxone and Azithromycin.  Despite treatment, she has had worsening delirium.   Resumed opiates, better intermittently.  Palliative care involved, GOC conversation led to plan for home with hospice on 10/31  Assessment and Plan:  End of life care After further discussion with her daughter on 10/26, her mother is suffering and this is not what she would want Family has decided to proceed with comfort care only Prn SL morphine   Multifocal PNA Started on IV rocephin and IV zithromax, continue with mucinex Improving symptoms, on room air with periodic need for [place] O2 supplementation for comfort more than apparent hypoxia Duonebs as needed Started on claritin for congestion Strep pneumo antigen is negative Improving with IV Antibiotics, completed on 10/23 She did have an abnormal chest CT in 02/2023 at [facility] with findings "highly concerning for primary lung malignancy"; her daughter reports that she was unaware of this Repeat chest CT on 10/23 was reassuring with  multifocal PNA without apparent malignancy   Dementia, now with delirium Recent onset, rapidly progressive according to her daughter - much worse in the last week or two, per daughter Hypersomnolent 10/23 - possibly due to medications and sleep cycle disturbance  More aware 10/24 but quite confused with some apparent visual hallucinations Frankly agitated 10/25 More calm on 10/26 with perseverance in speech Pleasant and oriented to person/place/time but still confused on 10/27 Likely due to hospital delirium  Pt started on hydroxyzine and haldol without any improvement Added zyprexa Head CT unremarkable on 10/25   Atrial fibrillation Rate controlled with metoprolol - stop with transition to comfort Not on AC due to questionable diagnosis    Hypertension Stop Toprol XL, losartan   Hyperlipidemia Will stop rosuvastatin and Zetia   Stage 3b CKD Thought to be AKI on presentation but appears to be stable and at/near baseline   Macrocytic anemia Appears to be stable   Thrombocytopenia Also stable   Hypomagnesemia Repleted   Chest pain S/p CABG 2014 Reported chest pain early in hospitalization, now apparently resolved With mildly elevated troponin of 75 and 76 EKG shows non specific st t wave changes, and t wave inversions Prior echo in 02/2023 was unremarkable  Repeat echo is ordered and also unremarkable Cardiology consulted and is not planning ischemic evaluation   Hypothyroidism -synthroid   Moderate malnutrition Nutrition Problem: Moderate Malnutrition Etiology: chronic illness (CHF) Signs/Symptoms: mild fat depletion, mild muscle depletion Interventions: Ensure Enlive (each supplement provides 350kcal and 20 grams of protein), MVI   Obesity Body mass index is 34.16 kg/m.Marland Kitchen  Weight loss should  Physician Discharge Summary   Patient: Tammy Suarez MRN: [id number] DOB: 1946/03/22  Admit date:     09/04/2023  Discharge date: 09/16/23  Discharge Physician: Joseph Art   PCP: Kerri Perches, MD   Recommendations at discharge:   You are being discharged home under the care of hospice   Discharge Diagnoses: Principal Problem:   Multifocal pneumonia Active Problems:   Hypothyroidism   Hyperlipidemia with target LDL less than 70   Obesity (BMI 30.0-34.9)   Essential hypertension, benign   DM (diabetes mellitus), type 2 with renal complications (HCC)   CKD (chronic kidney disease) stage 3, GFR 30-59 ml/min (HCC)   Coronary atherosclerosis of native coronary artery   Malnutrition of moderate degree   Dementia with behavioral disturbance [facility])   Hospital Course: 77yo with h/o PAF, HLD, and hypothyroidism who presented on 10/19 with cough and generalized weakness, found to have LLL PNA.  She was started on Ceftriaxone and Azithromycin.  Despite treatment, she has had worsening delirium.   Resumed opiates, better intermittently.  Palliative care involved, GOC conversation led to plan for home with hospice on 10/31  Assessment and Plan:  End of life care After further discussion with her daughter on 10/26, her mother is suffering and this is not what she would want Family has decided to proceed with comfort care only Prn SL morphine   Multifocal PNA Started on IV rocephin and IV zithromax, continue with mucinex Improving symptoms, on room air with periodic need for [place] O2 supplementation for comfort more than apparent hypoxia Duonebs as needed Started on claritin for congestion Strep pneumo antigen is negative Improving with IV Antibiotics, completed on 10/23 She did have an abnormal chest CT in 02/2023 at [facility] with findings "highly concerning for primary lung malignancy"; her daughter reports that she was unaware of this Repeat chest CT on 10/23 was reassuring with  multifocal PNA without apparent malignancy   Dementia, now with delirium Recent onset, rapidly progressive according to her daughter - much worse in the last week or two, per daughter Hypersomnolent 10/23 - possibly due to medications and sleep cycle disturbance  More aware 10/24 but quite confused with some apparent visual hallucinations Frankly agitated 10/25 More calm on 10/26 with perseverance in speech Pleasant and oriented to person/place/time but still confused on 10/27 Likely due to hospital delirium  Pt started on hydroxyzine and haldol without any improvement Added zyprexa Head CT unremarkable on 10/25   Atrial fibrillation Rate controlled with metoprolol - stop with transition to comfort Not on AC due to questionable diagnosis    Hypertension Stop Toprol XL, losartan   Hyperlipidemia Will stop rosuvastatin and Zetia   Stage 3b CKD Thought to be AKI on presentation but appears to be stable and at/near baseline   Macrocytic anemia Appears to be stable   Thrombocytopenia Also stable   Hypomagnesemia Repleted   Chest pain S/p CABG 2014 Reported chest pain early in hospitalization, now apparently resolved With mildly elevated troponin of 75 and 76 EKG shows non specific st t wave changes, and t wave inversions Prior echo in 02/2023 was unremarkable  Repeat echo is ordered and also unremarkable Cardiology consulted and is not planning ischemic evaluation   Hypothyroidism -synthroid   Moderate malnutrition Nutrition Problem: Moderate Malnutrition Etiology: chronic illness (CHF) Signs/Symptoms: mild fat depletion, mild muscle depletion Interventions: Ensure Enlive (each supplement provides 350kcal and 20 grams of protein), MVI   Obesity Body mass index is 34.16 kg/m.Marland Kitchen  Weight loss should  exposure control, adjustment of the mA and/or kV according to patient size and/or use of iterative reconstruction technique. COMPARISON:  05/27/2022 CT head FINDINGS: Brain: No evidence of acute infarction, hemorrhage, mass, mass effect, or midline shift. No hydrocephalus or extra-axial fluid collection. Periventricular white matter changes, likely the sequela of chronic small vessel ischemic disease. Vascular: No hyperdense vessel. Atherosclerotic calcifications in the intracranial carotid and vertebral arteries. Skull: Negative for fracture or focal lesion. Sinuses/Orbits: Clear paranasal sinuses. No acute finding in the orbits. Redemonstrated  calcifications along the posterior right globe. Other: The mastoid air cells are well aerated. IMPRESSION: No acute intracranial process. Electronically Signed   By: Wiliam Ke M.D.   On: 09/10/2023 15:28   CT CHEST W CONTRAST  Result Date: 09/08/2023 CLINICAL DATA:  Follow-up lung nodule greater than 1 cm. Abnormal x-ray. Previous radiograph demonstrated new area of atelectasis or consolidation in the left lung. EXAM: CT CHEST WITH CONTRAST TECHNIQUE: Multidetector CT imaging of the chest was performed during intravenous contrast administration. RADIATION DOSE REDUCTION: This exam was performed according to the departmental dose-optimization program which includes automated exposure control, adjustment of the mA and/or kV according to patient size and/or use of iterative reconstruction technique. CONTRAST:  60mL OMNIPAQUE IOHEXOL 300 MG/ML  SOLN COMPARISON:  Chest radiograph 09/04/2023.  CT chest 02/23/2023 FINDINGS: Cardiovascular: Postoperative changes in the mediastinum consistent with coronary bypass. Cardiac enlargement. No pericardial effusions. Normal caliber thoracic aorta. No aortic dissection. Great vessel origins are patent. Mediastinum/Nodes: Esophagus is decompressed. Mediastinal lymph nodes are unchanged since prior study without pathologic enlargement, likely reactive. Thyroid gland is unremarkable. Lungs/Pleura: Since the previous CT from April, the right upper lung consolidation has mostly resolved. There is a small amount of focal residual or recurrent consolidation in the right upper lung. New area of airspace disease in the left upper lung. Small bilateral pleural effusions with consolidation or atelectasis in the lower lungs, greater on the left, and new since prior study. Changes likely to represent multifocal pneumonia. Mild interstitial changes in the lungs could indicate interstitial pneumonia or edema. Upper Abdomen: A gastric lap band is present. No acute abnormalities.  Musculoskeletal: Sternotomy wires. Degenerative changes in the spine. Degenerative changes in both shoulders with severe degenerative change in the right shoulder. Right shoulder suggest chronic dislocation with bone remodeling and joint effusion/bursal fluid. IMPRESSION: 1. Small bilateral pleural effusions with infiltration or atelectasis in the lung bases, greater on the left. Patchy focal airspace infiltrates in the upper lungs. Probable multifocal pneumonia. 2. Interstitial septal thickening in the lungs likely to represent edema or possibly interstitial pneumonia. 3. Aortic atherosclerosis. 4. Severe asymmetrical degenerative changes in the right shoulder with suggestion of chronic dislocation including bone remodeling and large joint effusion/bursal fluid. Electronically Signed   By: Burman Nieves M.D.   On: 09/08/2023 20:31   ECHOCARDIOGRAM COMPLETE  Result Date: 09/08/2023    ECHOCARDIOGRAM REPORT   Patient Name:   KADIENCE HOLBERT Date of Exam: 09/08/2023 Medical Rec #:  [id number]     Height:       64.0 in Accession #:    [id number]    Weight:       199.0 lb Date of Birth:  [birth year]     BSA:          1.952 m Patient Age:    77 years      BP:           162/73 mmHg Patient Gender: F

## 2023-09-30 ENCOUNTER — Telehealth: Payer: Self-pay | Admitting: Family Medicine

## 2023-09-30 NOTE — Telephone Encounter (Signed)
[facility] CASE ID # [id number]

## 2023-09-30 NOTE — Telephone Encounter (Signed)
Copied from CRM [id number]. Topic: General - Deceased Patient >> 10/21/23  1:10 PM Eunice Blase wrote: Name of caller: Kerry Kass  Date of death: 10-19-2023   Name of funeral home: [employer] number of funeral home: [phone]  Provider that needs to sign form: Syliva Overman  Timeline for signing: Immediately due to cremation

## 2023-09-30 NOTE — Telephone Encounter (Signed)
Copied from CRM [id number]. Topic: General - Deceased Patient >> 2023/10/09 10:02 AM Raven B wrote: Name of caller: Harriett [facility] [facility]) call back # [phone]  Date of death: 2023-10-08  Name of funeral home: [facility]  Phone number of funeral home: [phone]  Provider that needs to sign form: Simpson,Margret E, MD   Timeline for signing: N/A (forms will be sent electronically)

## 2023-10-04 ENCOUNTER — Other Ambulatory Visit: Payer: Self-pay | Admitting: Family Medicine

## 2023-10-04 ENCOUNTER — Telehealth: Payer: Self-pay | Admitting: Family Medicine

## 2023-10-04 NOTE — Telephone Encounter (Signed)
Copied from CRM [id number]. Topic: General - Deceased Patient >> 11-01-23  3:22 PM Raven B wrote:  Name of caller: Tim Lair [phone]  Date of death: 10-26-2023   Name of funeral home: [facility]   Phone number of funeral home: [phone]  Provider that needs to sign form: Simpson,Margaret E, MD   Timeline for signing: As soon as possible Engineer, technical sales electronically

## 2023-10-06 ENCOUNTER — Telehealth: Payer: Self-pay | Admitting: Family Medicine

## 2023-10-06 NOTE — Telephone Encounter (Signed)
[email or web address] called in to check status on death certificate signature.  Wants a call back at [phone]

## 2023-10-17 DEATH — deceased

## 2023-11-13 ENCOUNTER — Other Ambulatory Visit: Payer: Self-pay | Admitting: Family Medicine

## 2023-11-14 ENCOUNTER — Other Ambulatory Visit: Payer: Self-pay | Admitting: Family Medicine

## 2024-04-02 ENCOUNTER — Other Ambulatory Visit: Payer: Self-pay | Admitting: Family Medicine
